# Patient Record
Sex: Female | Born: 1945 | Race: White | Hispanic: No | Smoking: Never smoker
Health system: Southern US, Community
[De-identification: ages and names within clinical notes are randomized; demographics above are authoritative.]

## PROBLEM LIST (undated history)

## (undated) DIAGNOSIS — I2510 Atherosclerotic heart disease of native coronary artery without angina pectoris: Secondary | ICD-10-CM

## (undated) DIAGNOSIS — I739 Peripheral vascular disease, unspecified: Secondary | ICD-10-CM

## (undated) DIAGNOSIS — K219 Gastro-esophageal reflux disease without esophagitis: Secondary | ICD-10-CM

## (undated) DIAGNOSIS — S88112A Complete traumatic amputation at level between knee and ankle, left lower leg, initial encounter: Secondary | ICD-10-CM

## (undated) DIAGNOSIS — I5042 Chronic combined systolic (congestive) and diastolic (congestive) heart failure: Secondary | ICD-10-CM

## (undated) DIAGNOSIS — C9110 Chronic lymphocytic leukemia of B-cell type not having achieved remission: Secondary | ICD-10-CM

## (undated) DIAGNOSIS — Z8674 Personal history of sudden cardiac arrest: Secondary | ICD-10-CM

## (undated) DIAGNOSIS — I255 Ischemic cardiomyopathy: Secondary | ICD-10-CM

## (undated) DIAGNOSIS — E785 Hyperlipidemia, unspecified: Secondary | ICD-10-CM

## (undated) DIAGNOSIS — S88111A Complete traumatic amputation at level between knee and ankle, right lower leg, initial encounter: Secondary | ICD-10-CM

## (undated) DIAGNOSIS — K449 Diaphragmatic hernia without obstruction or gangrene: Secondary | ICD-10-CM

## (undated) DIAGNOSIS — E119 Type 2 diabetes mellitus without complications: Secondary | ICD-10-CM

## (undated) DIAGNOSIS — I4819 Other persistent atrial fibrillation: Secondary | ICD-10-CM

## (undated) DIAGNOSIS — N184 Chronic kidney disease, stage 4 (severe): Secondary | ICD-10-CM

## (undated) DIAGNOSIS — I10 Essential (primary) hypertension: Secondary | ICD-10-CM

## (undated) HISTORY — DX: Chronic combined systolic (congestive) and diastolic (congestive) heart failure: I50.42

## (undated) HISTORY — DX: Peripheral vascular disease, unspecified: I73.9

## (undated) HISTORY — DX: Other persistent atrial fibrillation: I48.19

## (undated) HISTORY — DX: Diaphragmatic hernia without obstruction or gangrene: K44.9

## (undated) HISTORY — PX: OTHER SURGICAL HISTORY: SHX169

## (undated) HISTORY — PX: CARDIAC CATHETERIZATION: SHX172

## (undated) HISTORY — PX: CORONARY ARTERY BYPASS GRAFT: SHX141

## (undated) HISTORY — DX: Chronic kidney disease, stage 4 (severe): N18.4

## (undated) HISTORY — DX: Personal history of sudden cardiac arrest: Z86.74

## (undated) HISTORY — DX: Chronic lymphocytic leukemia of B-cell type not having achieved remission: C91.10

## (undated) HISTORY — DX: Ischemic cardiomyopathy: I25.5

## (undated) HISTORY — PX: ABDOMINAL HYSTERECTOMY: SHX81

## (undated) HISTORY — DX: Atherosclerotic heart disease of native coronary artery without angina pectoris: I25.10

## (undated) HISTORY — DX: Essential (primary) hypertension: I10

---

## 2003-09-12 HISTORY — PX: IMPLANTABLE CARDIOVERTER DEFIBRILLATOR IMPLANT: SHX5860

## 2011-04-26 ENCOUNTER — Emergency Department: Payer: Self-pay | Admitting: Emergency Medicine

## 2012-10-30 ENCOUNTER — Ambulatory Visit: Payer: Medicare Other | Admitting: Physical Therapy

## 2012-11-04 ENCOUNTER — Ambulatory Visit: Payer: Medicare Other | Attending: Orthopaedic Surgery | Admitting: Physical Therapy

## 2012-11-04 DIAGNOSIS — IMO0001 Reserved for inherently not codable concepts without codable children: Secondary | ICD-10-CM | POA: Insufficient documentation

## 2012-11-04 DIAGNOSIS — R5381 Other malaise: Secondary | ICD-10-CM | POA: Insufficient documentation

## 2012-11-04 DIAGNOSIS — S88119A Complete traumatic amputation at level between knee and ankle, unspecified lower leg, initial encounter: Secondary | ICD-10-CM | POA: Insufficient documentation

## 2012-11-04 DIAGNOSIS — R269 Unspecified abnormalities of gait and mobility: Secondary | ICD-10-CM | POA: Insufficient documentation

## 2012-11-04 DIAGNOSIS — M6281 Muscle weakness (generalized): Secondary | ICD-10-CM | POA: Insufficient documentation

## 2012-11-11 ENCOUNTER — Ambulatory Visit: Payer: Medicare Other | Attending: Orthopaedic Surgery | Admitting: Physical Therapy

## 2012-11-11 DIAGNOSIS — M6281 Muscle weakness (generalized): Secondary | ICD-10-CM | POA: Insufficient documentation

## 2012-11-11 DIAGNOSIS — S88119A Complete traumatic amputation at level between knee and ankle, unspecified lower leg, initial encounter: Secondary | ICD-10-CM | POA: Insufficient documentation

## 2012-11-11 DIAGNOSIS — R269 Unspecified abnormalities of gait and mobility: Secondary | ICD-10-CM | POA: Insufficient documentation

## 2012-11-11 DIAGNOSIS — IMO0001 Reserved for inherently not codable concepts without codable children: Secondary | ICD-10-CM | POA: Insufficient documentation

## 2012-11-11 DIAGNOSIS — R5381 Other malaise: Secondary | ICD-10-CM | POA: Insufficient documentation

## 2012-11-12 ENCOUNTER — Ambulatory Visit: Payer: Medicare Other | Admitting: Physical Therapy

## 2012-11-14 ENCOUNTER — Ambulatory Visit: Payer: Medicare Other | Admitting: Physical Therapy

## 2012-11-18 ENCOUNTER — Encounter: Payer: Medicare Other | Admitting: Physical Therapy

## 2012-11-19 ENCOUNTER — Ambulatory Visit: Payer: Medicare Other | Admitting: Physical Therapy

## 2012-11-22 ENCOUNTER — Ambulatory Visit: Payer: Medicare Other | Admitting: Physical Therapy

## 2012-11-25 ENCOUNTER — Ambulatory Visit: Payer: Medicare Other | Admitting: Physical Therapy

## 2012-11-26 ENCOUNTER — Ambulatory Visit: Payer: Medicare Other | Admitting: Physical Therapy

## 2012-11-28 ENCOUNTER — Ambulatory Visit: Payer: Medicare Other | Admitting: Physical Therapy

## 2012-12-02 ENCOUNTER — Ambulatory Visit: Payer: Medicare Other | Admitting: Physical Therapy

## 2012-12-05 ENCOUNTER — Ambulatory Visit: Payer: Medicare Other | Admitting: Physical Therapy

## 2012-12-11 ENCOUNTER — Ambulatory Visit: Payer: Medicare Other | Admitting: Physical Therapy

## 2012-12-12 ENCOUNTER — Ambulatory Visit: Payer: Medicare Other | Attending: Orthopaedic Surgery | Admitting: Physical Therapy

## 2012-12-12 DIAGNOSIS — R269 Unspecified abnormalities of gait and mobility: Secondary | ICD-10-CM | POA: Insufficient documentation

## 2012-12-12 DIAGNOSIS — IMO0001 Reserved for inherently not codable concepts without codable children: Secondary | ICD-10-CM | POA: Insufficient documentation

## 2012-12-12 DIAGNOSIS — M6281 Muscle weakness (generalized): Secondary | ICD-10-CM | POA: Insufficient documentation

## 2012-12-12 DIAGNOSIS — S88119A Complete traumatic amputation at level between knee and ankle, unspecified lower leg, initial encounter: Secondary | ICD-10-CM | POA: Insufficient documentation

## 2012-12-12 DIAGNOSIS — R5381 Other malaise: Secondary | ICD-10-CM | POA: Insufficient documentation

## 2012-12-13 ENCOUNTER — Ambulatory Visit: Payer: Medicare Other | Admitting: Physical Therapy

## 2012-12-17 ENCOUNTER — Ambulatory Visit: Payer: Medicare Other | Admitting: Physical Therapy

## 2012-12-19 ENCOUNTER — Ambulatory Visit: Payer: Medicare Other | Admitting: Physical Therapy

## 2012-12-23 ENCOUNTER — Ambulatory Visit: Payer: Medicare Other | Admitting: Physical Therapy

## 2012-12-25 ENCOUNTER — Ambulatory Visit: Payer: Medicare Other | Admitting: Physical Therapy

## 2012-12-30 ENCOUNTER — Ambulatory Visit: Payer: Medicare Other | Admitting: Physical Therapy

## 2013-01-01 ENCOUNTER — Ambulatory Visit: Payer: Medicare Other | Admitting: Physical Therapy

## 2013-01-06 ENCOUNTER — Ambulatory Visit: Payer: Medicare Other | Admitting: Physical Therapy

## 2013-01-08 ENCOUNTER — Ambulatory Visit: Payer: Medicare Other | Admitting: Physical Therapy

## 2013-01-13 ENCOUNTER — Ambulatory Visit: Payer: Medicare Other | Attending: Orthopaedic Surgery | Admitting: Physical Therapy

## 2013-01-13 DIAGNOSIS — IMO0001 Reserved for inherently not codable concepts without codable children: Secondary | ICD-10-CM | POA: Insufficient documentation

## 2013-01-13 DIAGNOSIS — M6281 Muscle weakness (generalized): Secondary | ICD-10-CM | POA: Insufficient documentation

## 2013-01-13 DIAGNOSIS — R269 Unspecified abnormalities of gait and mobility: Secondary | ICD-10-CM | POA: Insufficient documentation

## 2013-01-13 DIAGNOSIS — S88119A Complete traumatic amputation at level between knee and ankle, unspecified lower leg, initial encounter: Secondary | ICD-10-CM | POA: Insufficient documentation

## 2013-01-13 DIAGNOSIS — R5381 Other malaise: Secondary | ICD-10-CM | POA: Insufficient documentation

## 2013-01-15 ENCOUNTER — Ambulatory Visit: Payer: Medicare Other | Admitting: Physical Therapy

## 2013-01-20 ENCOUNTER — Ambulatory Visit: Payer: Medicare Other | Admitting: Physical Therapy

## 2013-01-22 ENCOUNTER — Ambulatory Visit: Payer: Medicare Other | Admitting: Physical Therapy

## 2015-06-12 ENCOUNTER — Encounter: Payer: Self-pay | Admitting: Emergency Medicine

## 2015-06-12 ENCOUNTER — Emergency Department: Payer: Medicare HMO

## 2015-06-12 ENCOUNTER — Observation Stay
Admission: EM | Admit: 2015-06-12 | Discharge: 2015-06-14 | Disposition: A | Discharge status: Home / Self Care | Payer: Medicare HMO | Attending: Internal Medicine | Admitting: Internal Medicine

## 2015-06-12 DIAGNOSIS — R001 Bradycardia, unspecified: Secondary | ICD-10-CM | POA: Insufficient documentation

## 2015-06-12 DIAGNOSIS — Z23 Encounter for immunization: Secondary | ICD-10-CM | POA: Diagnosis not present

## 2015-06-12 DIAGNOSIS — Z881 Allergy status to other antibiotic agents status: Secondary | ICD-10-CM | POA: Diagnosis not present

## 2015-06-12 DIAGNOSIS — N189 Chronic kidney disease, unspecified: Secondary | ICD-10-CM | POA: Diagnosis present

## 2015-06-12 DIAGNOSIS — R0602 Shortness of breath: Secondary | ICD-10-CM | POA: Insufficient documentation

## 2015-06-12 DIAGNOSIS — R140 Abdominal distension (gaseous): Secondary | ICD-10-CM | POA: Insufficient documentation

## 2015-06-12 DIAGNOSIS — N183 Chronic kidney disease, stage 3 (moderate): Secondary | ICD-10-CM | POA: Insufficient documentation

## 2015-06-12 DIAGNOSIS — Z89512 Acquired absence of left leg below knee: Secondary | ICD-10-CM | POA: Diagnosis not present

## 2015-06-12 DIAGNOSIS — Z794 Long term (current) use of insulin: Secondary | ICD-10-CM | POA: Diagnosis not present

## 2015-06-12 DIAGNOSIS — C9110 Chronic lymphocytic leukemia of B-cell type not having achieved remission: Secondary | ICD-10-CM | POA: Diagnosis not present

## 2015-06-12 DIAGNOSIS — R17 Unspecified jaundice: Secondary | ICD-10-CM | POA: Insufficient documentation

## 2015-06-12 DIAGNOSIS — R079 Chest pain, unspecified: Secondary | ICD-10-CM | POA: Diagnosis present

## 2015-06-12 DIAGNOSIS — I5023 Acute on chronic systolic (congestive) heart failure: Secondary | ICD-10-CM | POA: Insufficient documentation

## 2015-06-12 DIAGNOSIS — E785 Hyperlipidemia, unspecified: Secondary | ICD-10-CM | POA: Insufficient documentation

## 2015-06-12 DIAGNOSIS — R002 Palpitations: Secondary | ICD-10-CM | POA: Diagnosis not present

## 2015-06-12 DIAGNOSIS — K219 Gastro-esophageal reflux disease without esophagitis: Secondary | ICD-10-CM | POA: Insufficient documentation

## 2015-06-12 DIAGNOSIS — R0609 Other forms of dyspnea: Secondary | ICD-10-CM | POA: Insufficient documentation

## 2015-06-12 DIAGNOSIS — Z801 Family history of malignant neoplasm of trachea, bronchus and lung: Secondary | ICD-10-CM | POA: Insufficient documentation

## 2015-06-12 DIAGNOSIS — Z90710 Acquired absence of both cervix and uterus: Secondary | ICD-10-CM | POA: Insufficient documentation

## 2015-06-12 DIAGNOSIS — I481 Persistent atrial fibrillation: Secondary | ICD-10-CM | POA: Diagnosis not present

## 2015-06-12 DIAGNOSIS — Z950 Presence of cardiac pacemaker: Secondary | ICD-10-CM | POA: Diagnosis not present

## 2015-06-12 DIAGNOSIS — Z8249 Family history of ischemic heart disease and other diseases of the circulatory system: Secondary | ICD-10-CM | POA: Diagnosis not present

## 2015-06-12 DIAGNOSIS — E1122 Type 2 diabetes mellitus with diabetic chronic kidney disease: Principal | ICD-10-CM | POA: Insufficient documentation

## 2015-06-12 DIAGNOSIS — N179 Acute kidney failure, unspecified: Secondary | ICD-10-CM | POA: Insufficient documentation

## 2015-06-12 DIAGNOSIS — R110 Nausea: Secondary | ICD-10-CM | POA: Diagnosis not present

## 2015-06-12 DIAGNOSIS — Z951 Presence of aortocoronary bypass graft: Secondary | ICD-10-CM | POA: Diagnosis not present

## 2015-06-12 DIAGNOSIS — Z89511 Acquired absence of right leg below knee: Secondary | ICD-10-CM | POA: Insufficient documentation

## 2015-06-12 DIAGNOSIS — H3530 Unspecified macular degeneration: Secondary | ICD-10-CM | POA: Diagnosis not present

## 2015-06-12 DIAGNOSIS — Z79899 Other long term (current) drug therapy: Secondary | ICD-10-CM | POA: Diagnosis not present

## 2015-06-12 DIAGNOSIS — I4819 Other persistent atrial fibrillation: Secondary | ICD-10-CM | POA: Insufficient documentation

## 2015-06-12 DIAGNOSIS — R5381 Other malaise: Secondary | ICD-10-CM | POA: Insufficient documentation

## 2015-06-12 DIAGNOSIS — I25119 Atherosclerotic heart disease of native coronary artery with unspecified angina pectoris: Secondary | ICD-10-CM | POA: Insufficient documentation

## 2015-06-12 DIAGNOSIS — I25701 Atherosclerosis of coronary artery bypass graft(s), unspecified, with angina pectoris with documented spasm: Secondary | ICD-10-CM | POA: Insufficient documentation

## 2015-06-12 DIAGNOSIS — I130 Hypertensive heart and chronic kidney disease with heart failure and stage 1 through stage 4 chronic kidney disease, or unspecified chronic kidney disease: Secondary | ICD-10-CM | POA: Diagnosis not present

## 2015-06-12 DIAGNOSIS — I25111 Atherosclerotic heart disease of native coronary artery with angina pectoris with documented spasm: Secondary | ICD-10-CM | POA: Insufficient documentation

## 2015-06-12 DIAGNOSIS — K449 Diaphragmatic hernia without obstruction or gangrene: Secondary | ICD-10-CM | POA: Diagnosis not present

## 2015-06-12 DIAGNOSIS — N185 Chronic kidney disease, stage 5: Secondary | ICD-10-CM | POA: Insufficient documentation

## 2015-06-12 DIAGNOSIS — I5043 Acute on chronic combined systolic (congestive) and diastolic (congestive) heart failure: Secondary | ICD-10-CM | POA: Insufficient documentation

## 2015-06-12 DIAGNOSIS — Z7982 Long term (current) use of aspirin: Secondary | ICD-10-CM | POA: Insufficient documentation

## 2015-06-12 DIAGNOSIS — I209 Angina pectoris, unspecified: Secondary | ICD-10-CM

## 2015-06-12 DIAGNOSIS — R05 Cough: Secondary | ICD-10-CM | POA: Insufficient documentation

## 2015-06-12 DIAGNOSIS — Z95810 Presence of automatic (implantable) cardiac defibrillator: Secondary | ICD-10-CM | POA: Diagnosis not present

## 2015-06-12 HISTORY — DX: Gastro-esophageal reflux disease without esophagitis: K21.9

## 2015-06-12 HISTORY — DX: Complete traumatic amputation at level between knee and ankle, left lower leg, initial encounter: S88.112A

## 2015-06-12 HISTORY — DX: Type 2 diabetes mellitus without complications: E11.9

## 2015-06-12 HISTORY — DX: Complete traumatic amputation at level between knee and ankle, right lower leg, initial encounter: S88.111A

## 2015-06-12 HISTORY — DX: Hyperlipidemia, unspecified: E78.5

## 2015-06-12 LAB — GLUCOSE, CAPILLARY: GLUCOSE-CAPILLARY: 142 mg/dL — AB (ref 65–99)

## 2015-06-12 LAB — CBC WITH DIFFERENTIAL/PLATELET
BASOS PCT: 0 %
Basophils Absolute: 0 10*3/uL (ref 0–0.1)
EOS PCT: 1 %
Eosinophils Absolute: 0.2 10*3/uL (ref 0–0.7)
HEMATOCRIT: 35.6 % (ref 35.0–47.0)
Hemoglobin: 11.6 g/dL — ABNORMAL LOW (ref 12.0–16.0)
Lymphocytes Relative: 70 %
Lymphs Abs: 16.9 10*3/uL — ABNORMAL HIGH (ref 1.0–3.6)
MCH: 26.9 pg (ref 26.0–34.0)
MCHC: 32.6 g/dL (ref 32.0–36.0)
MCV: 82.7 fL (ref 80.0–100.0)
MONO ABS: 0.7 10*3/uL (ref 0.2–0.9)
MONOS PCT: 3 %
NEUTROS PCT: 26 %
Neutro Abs: 6.3 10*3/uL (ref 1.4–6.5)
Platelets: 148 10*3/uL — ABNORMAL LOW (ref 150–440)
RBC: 4.3 MIL/uL (ref 3.80–5.20)
RDW: 17.7 % — ABNORMAL HIGH (ref 11.5–14.5)
WBC: 24.1 10*3/uL — AB (ref 3.6–11.0)

## 2015-06-12 LAB — BASIC METABOLIC PANEL
ANION GAP: 7 (ref 5–15)
BUN: 46 mg/dL — AB (ref 6–20)
CHLORIDE: 101 mmol/L (ref 101–111)
CO2: 24 mmol/L (ref 22–32)
Calcium: 8.4 mg/dL — ABNORMAL LOW (ref 8.9–10.3)
Creatinine, Ser: 2.48 mg/dL — ABNORMAL HIGH (ref 0.44–1.00)
GFR calc Af Amer: 22 mL/min — ABNORMAL LOW (ref >60)
GFR calc non Af Amer: 19 mL/min — ABNORMAL LOW (ref >60)
GLUCOSE: 176 mg/dL — AB (ref 65–99)
Potassium: 4.1 mmol/L (ref 3.5–5.1)
Sodium: 132 mmol/L — ABNORMAL LOW (ref 135–145)

## 2015-06-12 LAB — BRAIN NATRIURETIC PEPTIDE: B NATRIURETIC PEPTIDE 5: 456 pg/mL — AB (ref 0.0–100.0)

## 2015-06-12 LAB — TROPONIN I: Troponin I: <0.03 ng/mL (ref <0.031)

## 2015-06-12 MED ORDER — NITROGLYCERIN 0.4 MG SL SUBL
0.4000 mg | SUBLINGUAL_TABLET | SUBLINGUAL | Status: DC | PRN
  Administered 2015-06-12 – 2015-06-13 (×3): 0.4 mg via SUBLINGUAL
  Filled 2015-06-12 (×2): qty 1

## 2015-06-12 MED ORDER — OXYCODONE-ACETAMINOPHEN 5-325 MG PO TABS
1.0000 | ORAL_TABLET | ORAL | Status: DC | PRN
  Administered 2015-06-12 – 2015-06-13 (×4): 1 via ORAL
  Filled 2015-06-12 (×4): qty 1

## 2015-06-12 MED ORDER — SODIUM FLUORIDE 1.1 % DT GEL: Freq: Every day | DENTAL | Status: DC

## 2015-06-12 MED ORDER — ACETAMINOPHEN 325 MG PO TABS: 650.0000 mg | ORAL_TABLET | Freq: Four times a day (QID) | ORAL | Status: DC | PRN

## 2015-06-12 MED ORDER — CARVEDILOL 25 MG PO TABS
25.0000 mg | ORAL_TABLET | Freq: Two times a day (BID) | ORAL | Status: DC
Start: 2015-06-12 — End: 2015-06-13
  Administered 2015-06-12 – 2015-06-13 (×2): 25 mg via ORAL
  Filled 2015-06-12 (×2): qty 1

## 2015-06-12 MED ORDER — INSULIN GLARGINE 100 UNIT/ML ~~LOC~~ SOLN
14.0000 [IU] | Freq: Every day | SUBCUTANEOUS | Status: DC
  Administered 2015-06-12 – 2015-06-13 (×2): 14 [IU] via SUBCUTANEOUS
  Filled 2015-06-12 (×3): qty 0.14

## 2015-06-12 MED ORDER — ASPIRIN 81 MG PO CHEW
81.0000 mg | CHEWABLE_TABLET | Freq: Every day | ORAL | Status: DC
  Administered 2015-06-12 – 2015-06-13 (×2): 81 mg via ORAL
  Filled 2015-06-12 (×2): qty 1

## 2015-06-12 MED ORDER — ACETAMINOPHEN 650 MG RE SUPP: 650.0000 mg | Freq: Four times a day (QID) | RECTAL | Status: DC | PRN

## 2015-06-12 MED ORDER — NITROGLYCERIN 0.4 MG SL SUBL
SUBLINGUAL_TABLET | SUBLINGUAL | Status: AC
  Administered 2015-06-12: 0.4 mg via SUBLINGUAL
  Filled 2015-06-12: qty 1

## 2015-06-12 MED ORDER — INSULIN GLARGINE 100 UNIT/ML ~~LOC~~ SOLN
35.0000 [IU] | Freq: Every day | SUBCUTANEOUS | Status: DC
  Administered 2015-06-13 – 2015-06-14 (×2): 35 [IU] via SUBCUTANEOUS
  Filled 2015-06-12 (×4): qty 0.35

## 2015-06-12 MED ORDER — OCUVITE PO TABS
1.0000 | ORAL_TABLET | Freq: Every day | ORAL | Status: DC
  Administered 2015-06-12 – 2015-06-14 (×3): 1 via ORAL
  Filled 2015-06-12 (×4): qty 1

## 2015-06-12 MED ORDER — SODIUM CHLORIDE 0.9 % IJ SOLN
3.0000 mL | Freq: Two times a day (BID) | INTRAMUSCULAR | Status: DC
  Administered 2015-06-12 – 2015-06-14 (×4): 3 mL via INTRAVENOUS

## 2015-06-12 MED ORDER — MORPHINE SULFATE (PF) 4 MG/ML IV SOLN
4.0000 mg | Freq: Once | INTRAVENOUS | Status: AC
  Administered 2015-06-12: 4 mg via INTRAVENOUS
  Filled 2015-06-12: qty 1

## 2015-06-12 MED ORDER — POLYSACCHARIDE IRON COMPLEX 150 MG PO CAPS
150.0000 mg | ORAL_CAPSULE | Freq: Every day | ORAL | Status: DC
  Administered 2015-06-12 – 2015-06-14 (×3): 150 mg via ORAL
  Filled 2015-06-12 (×3): qty 1

## 2015-06-12 MED ORDER — FUROSEMIDE 10 MG/ML IJ SOLN
60.0000 mg | Freq: Every day | INTRAMUSCULAR | Status: DC
  Administered 2015-06-13 – 2015-06-14 (×2): 60 mg via INTRAVENOUS
  Filled 2015-06-12 (×2): qty 6

## 2015-06-12 MED ORDER — ATORVASTATIN CALCIUM 20 MG PO TABS
40.0000 mg | ORAL_TABLET | Freq: Every day | ORAL | Status: DC
  Administered 2015-06-12 – 2015-06-14 (×3): 40 mg via ORAL
  Filled 2015-06-12 (×3): qty 2

## 2015-06-12 MED ORDER — INSULIN ASPART 100 UNIT/ML ~~LOC~~ SOLN
0.0000 [IU] | Freq: Every day | SUBCUTANEOUS | Status: DC
  Administered 2015-06-13: 2 [IU] via SUBCUTANEOUS
  Filled 2015-06-12: qty 2

## 2015-06-12 MED ORDER — HEPARIN SODIUM (PORCINE) 5000 UNIT/ML IJ SOLN
5000.0000 [IU] | Freq: Three times a day (TID) | INTRAMUSCULAR | Status: DC
  Administered 2015-06-12 – 2015-06-14 (×6): 5000 [IU] via SUBCUTANEOUS
  Filled 2015-06-12 (×6): qty 1

## 2015-06-12 MED ORDER — AMITRIPTYLINE HCL 25 MG PO TABS
25.0000 mg | ORAL_TABLET | Freq: Every evening | ORAL | Status: DC
  Administered 2015-06-12 – 2015-06-13 (×2): 25 mg via ORAL
  Filled 2015-06-12 (×2): qty 1

## 2015-06-12 MED ORDER — INSULIN ASPART 100 UNIT/ML ~~LOC~~ SOLN
0.0000 [IU] | Freq: Three times a day (TID) | SUBCUTANEOUS | Status: DC
  Administered 2015-06-13: 2 [IU] via SUBCUTANEOUS
  Administered 2015-06-13 (×2): 3 [IU] via SUBCUTANEOUS
  Filled 2015-06-12: qty 2
  Filled 2015-06-12 (×2): qty 3

## 2015-06-12 MED ORDER — SERTRALINE HCL 50 MG PO TABS
100.0000 mg | ORAL_TABLET | Freq: Every day | ORAL | Status: DC
  Administered 2015-06-12 – 2015-06-14 (×3): 100 mg via ORAL
  Filled 2015-06-12 (×3): qty 2

## 2015-06-12 MED ORDER — VITAMIN C 500 MG PO TABS
500.0000 mg | ORAL_TABLET | Freq: Every day | ORAL | Status: DC
  Administered 2015-06-12 – 2015-06-14 (×3): 500 mg via ORAL
  Filled 2015-06-12 (×3): qty 1

## 2015-06-12 MED ORDER — AMLODIPINE BESYLATE 10 MG PO TABS
10.0000 mg | ORAL_TABLET | Freq: Every day | ORAL | Status: DC
  Administered 2015-06-12 – 2015-06-13 (×2): 10 mg via ORAL
  Filled 2015-06-12 (×2): qty 1

## 2015-06-12 MED ORDER — VITAMIN D 1000 UNITS PO TABS
4000.0000 [IU] | ORAL_TABLET | Freq: Every day | ORAL | Status: DC
  Administered 2015-06-12 – 2015-06-14 (×3): 4000 [IU] via ORAL
  Filled 2015-06-12 (×3): qty 4

## 2015-06-12 MED ORDER — FUROSEMIDE 10 MG/ML IJ SOLN
80.0000 mg | Freq: Once | INTRAMUSCULAR | Status: AC
  Administered 2015-06-12: 80 mg via INTRAVENOUS
  Filled 2015-06-12: qty 8

## 2015-06-12 MED ORDER — PANTOPRAZOLE SODIUM 40 MG PO TBEC
40.0000 mg | DELAYED_RELEASE_TABLET | Freq: Every day | ORAL | Status: DC
  Administered 2015-06-13 – 2015-06-14 (×2): 40 mg via ORAL
  Filled 2015-06-12 (×2): qty 1

## 2015-06-12 NOTE — ED Notes (Addendum)
Pt arrived via EMS from home with daughter.  Pt states chest pain started about an hour ago. Patient reports not feeling well over the past few days. Has a history of cardiac arrest 12 years ago.  Recently increased dosage of Lasix due to feeling like she had extra fluid on her.  Patient reports to having central chest pain radiating to left side of the neck. 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  Reports diaphoresis as well. EMS gave 324 ASA and 1 of nitroglycerin

## 2015-06-12 NOTE — Progress Notes (Signed)
Pt. Arrived to floor via stretcher, she transfer from stretcher to bed with no help. Tele applied. Pt. Alert and oritented. Pt. Oriented to room, call bell system and ascoms, no complaint at this time.   SKIN assessment; Skin is warm and dry. Pt. Is bilateral below knee amputee. No skin issues noted. Verifying nurse CK,RN

## 2015-06-12 NOTE — H&P (Signed)
[facility] [place] NAME: Caroline Hernandez    MR#:  [id number]  DATE OF BIRTH:  May 25, 1946  DATE OF ADMISSION:  06/12/2015  PRIMARY CARE PHYSICIAN: Dr. Durward Parcel  REQUESTING/REFERRING PHYSICIAN: Dr. Dineen Kid  CHIEF COMPLAINT:   Chief Complaint  Patient presents with  . Chest Pain    HISTORY OF PRESENT ILLNESS:  Caroline Hernandez  is a 69 y.o. female with a known history of sudden death, CAD, CHF, atrial fibrillation, hypertension, diabetes, CLL, chronic kidney disease. She presents with not feeling right. She got hot and sweaty. Developed chest pressure over the last 2-3 days like a brick sitting on her chest. It comes and goes mostly at rest. 2 nitroglycerin helped and morphine also helped. She's not been very active since he developed a sore on her leg in August she usually uses a wheelchair. 2 days ago she did have some vomiting episode. Currently she feels okay just a little aching in the chest. Hospitalist services were contacted for further evaluation  PAST MEDICAL HISTORY:   Past Medical History  Diagnosis Date  . CHF (congestive heart failure) ([place])   . Diabetes mellitus without complication ([place])   . Hypertension   . Cancer ([place])     leukemia  . Amputation of right lower extremity below knee ([place])   . Amputation of left lower extremity below knee ([place])   . A-fib ([place])   . GERD (gastroesophageal reflux disease)   . Hyperlipidemia     PAST SURGICAL HISTORY:   Past Surgical History  Procedure Laterality Date  . Abdominal hysterectomy    . Insert / replace / remove pacemaker    . Amputation lower extremity bilaterally Bilateral   . Coronary artery bypass graft      SOCIAL HISTORY:   Social History  Substance Use Topics  . Smoking status: Never Smoker   . Smokeless tobacco: Not on file  . Alcohol Use: No    FAMILY HISTORY:   Family History  Problem Relation Age of Onset  . CAD Mother   . Diabetes Mother    . Atrial fibrillation Mother   . Lung cancer Father   . Diabetes Father     DRUG ALLERGIES:   Allergies  Allergen Reactions  . Zosyn [Piperacillin Sod-Tazobactam So] Other (See Comments)    Renal failure    REVIEW OF SYSTEMS:  CONSTITUTIONAL: No fever, positive for chills. Positive weakness  EYES: No blurred or double vision. Wears glasses. Has macular degeneration. EARS, NOSE, AND THROAT: No tinnitus or ear pain. No sore throat. Positive for runny nose and hoarse voice. RESPIRATORY: Positive for cough, positive for shortness of breath, no wheezing or hemoptysis.  CARDIOVASCULAR: Positive for chest pain, orthopnea, and edema.  GASTROINTESTINAL: Positive for nausea and vomiting a few days ago, no diarrhea or abdominal pain. No blood in bowel movements GENITOURINARY: No dysuria, hematuria.  ENDOCRINE: No polyuria, nocturia,  HEMATOLOGY: Positive for anemia, no easy bruising or bleeding SKIN: No rash or lesion. MUSCULOSKELETAL: No joint pain or arthritis.   NEUROLOGIC: Felt faint PSYCHIATRY: Positive for anxiety or depression.   MEDICATIONS AT HOME:   Prior to Admission medications   Medication Sig Start Date End Date Taking? Authorizing Provider  amitriptyline (ELAVIL) 25 MG tablet Take 25 mg by mouth every evening. 06/02/15  Yes Historical Provider, MD  amLODipine (NORVASC) 10 MG tablet Take 10 mg by mouth daily. 06/02/15  Yes Historical Provider, MD  amoxicillin (AMOXIL) 500 MG capsule  Take 500 mg by mouth 3 (three) times daily. X 4 days 06/09/15  Yes Historical Provider, MD  aspirin 81 MG chewable tablet Chew 81 mg by mouth at bedtime.   Yes Historical Provider, MD  atorvastatin (LIPITOR) 40 MG tablet Take 40 mg by mouth daily. 06/02/15  Yes Historical Provider, MD  carvedilol (COREG) 25 MG tablet Take 25 mg by mouth 2 (two) times daily. 05/30/15  Yes Historical Provider, MD  Cholecalciferol (HM VITAMIN D3) 4000 UNITS CAPS Take 4,000 Units by mouth daily.   Yes Historical  Provider, MD  furosemide (LASIX) 40 MG tablet Take 40 mg by mouth daily. 06/02/15  Yes Historical Provider, MD  HUMALOG 100 UNIT/ML injection Inject 10-14 Units into the skin See admin instructions. Inject 12 units subcutaneous in the morning with breakfast, 10 units subcutaneous at lunch, and 14 units subcutaneous at suppertime. 05/28/15  Yes Historical Provider, MD  LANTUS 100 UNIT/ML injection Inject 14-35 Units into the skin See admin instructions. Inject 35 units subcutaneous once a day in the morning. Inject 14 units subcutaneous once a day at bedtime. 05/31/15  Yes Historical Provider, MD  losartan (COZAAR) 100 MG tablet Take 100 mg by mouth daily. 06/02/15  Yes Historical Provider, MD  Multiple Vitamins-Minerals (OCUTABS-LUTEIN) TABS Take 1 tablet by mouth daily.   Yes Historical Provider, MD  oxyCODONE-acetaminophen (PERCOCET/ROXICET) 5-325 MG tablet Take 1 tablet by mouth every 4 (four) hours as needed. For pain. 06/09/15  Yes Historical Provider, MD  pantoprazole (PROTONIX) 40 MG tablet Take 40 mg by mouth daily. 05/24/15  Yes Historical Provider, MD  POLY-IRON 150 150 MG capsule Take 150 mg by mouth daily. 05/24/15  Yes Historical Provider, MD  PREVIDENT 5000 DRY MOUTH 1.1 % GEL dental gel Take by mouth daily. Brush teeth with paste for at least 1 minute once a day. 03/25/15  Yes Historical Provider, MD  sertraline (ZOLOFT) 100 MG tablet Take 100 mg by mouth daily. 06/02/15  Yes Historical Provider, MD  vitamin C (ASCORBIC ACID) 500 MG tablet Take 500 mg by mouth daily.   Yes Historical Provider, MD      VITAL SIGNS:  Blood pressure 128/54, pulse 41, temperature 98.2 F (36.8 C), temperature source Oral, resp. rate 17, height 5\' 8"  (1.727 m), weight 87.544 kg (193 lb), SpO2 91 %.  PHYSICAL EXAMINATION:  GENERAL:  69 y.o.-year-old patient lying in the bed with no acute distress.  EYES: Pupils equal, round, reactive to light and accommodation. No scleral icterus. Extraocular muscles intact.   HEENT: Head atraumatic, normocephalic. Oropharynx and nasopharynx clear.  NECK:  Supple, no jugular venous distention. No thyroid enlargement, no tenderness.  LUNGS: Decreased breath sounds bilaterally, positive rales at the bases bilaterally, no rhonchi or crepitation. No use of accessory muscles of respiration.  CARDIOVASCULAR: S1, S2 irregularly irregular. No murmurs, rubs, or gallops.  ABDOMEN: Soft, nontender, nondistended. Bowel sounds present. No organomegaly or mass.  EXTREMITIES: 2+ edema, no cyanosis, or clubbing.  NEUROLOGIC: Cranial nerves II through XII are intact. Muscle strength 5/5 in all extremities. Sensation intact. Gait not checked.  PSYCHIATRIC: The patient is alert and oriented x 3.  SKIN: No rash, lesion, or ulcer.   LABORATORY PANEL:   CBC  Recent Labs Lab 06/12/15 1706  WBC 24.1*  HGB 11.6*  HCT 35.6  PLT 148*   ------------------------------------------------------------------------------------------------------------------  Chemistries   Recent Labs Lab 06/12/15 1706  NA 132*  K 4.1  CL 101  CO2 24  GLUCOSE 176*  BUN 46*  CREATININE 2.48*  CALCIUM 8.4*   ------------------------------------------------------------------------------------------------------------------  Cardiac Enzymes  Recent Labs Lab 06/12/15 1706  TROPONINI <0.03   ------------------------------------------------------------------------------------------------------------------  RADIOLOGY:  Dg Chest 1 View  06/12/2015   CLINICAL DATA:  Chest pain  EXAM: CHEST 1 VIEW  COMPARISON:  04/26/2011  FINDINGS: Cardiac enlargement. Median sternotomy. AICD in satisfactory position and unchanged  Mild vascular congestion consistent with fluid overload. No edema or effusion.  IMPRESSION: Mild fluid overload without edema.   Electronically Signed   By: Franchot Gallo M.D.   On: 06/12/2015 17:42    EKG:   Atrial fibrillation 57 bpm, left axis deviation, Q waves septally and  flipped T's laterally  IMPRESSION AND PLAN:   1. Chest pain with history of CAD. Unfortunately on Saturday evening and Sunday I do not have further cardiac testing. I will admit as an observation and get a cardiology consultation. I will monitor on telemetry and get serial cardiac enzymes. Continue aspirin and Coreg and monitor. 2. Acute on chronic systolic congestive heart failure. I will give a dose of IV Lasix 80 mg 1 and 60 mg tomorrow. Continue Coreg. 3. Acute on chronic kidney disease stage III- monitor closely with diuresis. Hold losartan for right now. 4. Atrial fibrillation rate controlled on Coreg. As per prior cardiology note they are considering anticoagulation. 5. CLL 6. Diabetes type 2- continue Lantus and sliding scale 7. Essential hypertension- continue current medications 8. Gastroesophageal reflux disease without esophagitis on PPI as outpatient will continue while here. 9. History of macular degeneration  All the records are reviewed and case discussed with ED provider. Management plans discussed with the patient, family and they are in agreement.  CODE STATUS: Full code  TOTAL TIME TAKING CARE OF THIS PATIENT: 55 minutes.    Loletha Grayer M.D on 06/12/2015 at 6:50 PM  Between 7am to 6pm - Pager - [phone]  After 6pm call admission pager [facility]  Office  [phone]  CC: Primary care physician; Dr. Abbott Pao

## 2015-06-12 NOTE — ED Provider Notes (Addendum)
[facility] Emergency Department Provider Note  ____________________________________________  Time seen: Seen upon arrival to the emergency department  I have reviewed the triage vital signs and the nursing notes.   HISTORY  Chief Complaint Chest Pain    HPI Caroline Hernandez is a 69 y.o. female with a history of congestive heart failure and coronary artery disease who is presenting today with weakness over the past 4 days and chest pain over the last hour. She says that she has had weakness with exertional dyspnea over the last 4 days and then just an hour ago began to have central chest pain radiating up for jaundice and her left arm. She rates it as a 7 out of 10 that was brought down to a 5 out of 10 with nitroglycerin from the medics. She was also given 324 mg of aspirin. She says that she does have a recent diagnosis of A. fib but has not been placed on any anticoagulation [place] at this time. She has a history of a CABG. Says that her chest pain is back up to a 7 out of 10 at this time. Normally takes 40 mg of Lasix daily but due to which she had perceived as increased swelling to her abdomen she increased her dose to 80 mg yesterday.   Past Medical History  Diagnosis Date  . CHF (congestive heart failure) ([place])   . Diabetes mellitus without complication ([place])   . Hypertension   . Cancer ([place])     leukemia  . Amputation of right lower extremity below knee ([place])   . Amputation of left lower extremity below knee ([place])   . A-fib ([place])   . GERD (gastroesophageal reflux disease)   . Hyperlipidemia     There are no active problems to display for this patient.   Past Surgical History  Procedure Laterality Date  . Abdominal hysterectomy      No current outpatient prescriptions on file.  Allergies Zosyn  History reviewed. No pertinent family history.  Social History Social History  Substance Use Topics  . Smoking status: Never Smoker   . Smokeless  tobacco: None  . Alcohol Use: No    Review of Systems Constitutional: No fever/chills Eyes: No visual changes. ENT: No sore throat. Cardiovascular: As above Respiratory: As above Gastrointestinal: No abdominal pain.  No nausea, no vomiting.  No diarrhea.  No constipation. Genitourinary: Negative for dysuria. Musculoskeletal: Negative for back pain. Skin: Negative for rash. Neurological: Negative for headaches, focal weakness or numbness.  10-point ROS otherwise negative.  ____________________________________________   PHYSICAL EXAM:  VITAL SIGNS: ED Triage Vitals  Enc Vitals Group     BP 06/12/15 1653 141/59 mmHg     Pulse Rate 06/12/15 1701 51     Resp 06/12/15 1701 16     Temp 06/12/15 1701 98.2 F (36.8 C)     Temp Source 06/12/15 1701 Oral     SpO2 06/12/15 1701 92 %     Weight 06/12/15 1701 193 lb (87.544 kg)     Height 06/12/15 1701 5\' 8"  (1.727 m)     Head Cir --      Peak Flow --      Pain Score 06/12/15 1702 8     Pain Loc --      Pain Edu? --      Excl. in [place]? --     Constitutional: Alert and oriented. Well appearing and in no acute distress. Eyes: Conjunctivae are normal. PERRL. EOMI. Head:  Atraumatic. Nose: No congestion/rhinnorhea. Mouth/Throat: Mucous membranes are moist.  Oropharynx non-erythematous. Neck: No stridor.   Cardiovascular: Bradycardic with an irregularly irregular rhythm. Grossly normal heart sounds.  Good peripheral circulation. Respiratory: Normal respiratory effort.  Mild rales to the bilateral bases.  Gastrointestinal: Soft and nontender. No distention. No abdominal bruits. No CVA tenderness. Musculoskeletal: Bilateral leg amputations. Neurologic:  Normal speech and language. No gross focal neurologic deficits are appreciated. No gait instability. Skin:  Skin is warm, dry and intact. No rash noted. Psychiatric: Mood and affect are normal. Speech and behavior are normal.  ____________________________________________    LABS (all labs ordered are listed, but only abnormal results are displayed)  Labs Reviewed  BRAIN NATRIURETIC PEPTIDE - Abnormal; Notable for the following:    B Natriuretic Peptide 456.0 (*)    All other components within normal limits  CBC WITH DIFFERENTIAL/PLATELET - Abnormal; Notable for the following:    WBC 24.1 (*)    Hemoglobin 11.6 (*)    RDW 17.7 (*)    Platelets 148 (*)    All other components within normal limits  BASIC METABOLIC PANEL - Abnormal; Notable for the following:    Sodium 132 (*)    Glucose, Bld 176 (*)    BUN 46 (*)    Creatinine, Ser 2.48 (*)    Calcium 8.4 (*)    GFR calc non Af Amer 19 (*)    GFR calc Af Amer 22 (*)    All other components within normal limits  TROPONIN I  URINALYSIS COMPLETEWITH MICROSCOPIC ([facility] ONLY)   ____________________________________________  EKG  ED ECG REPORT I, Doran Stabler, the attending physician, personally viewed and interpreted this ECG.   Date: 06/12/2015  EKG Time: 1700  Rate: 57  Rhythm: atrial fibrillation, rate 57  Axis: Left axis deviation  Intervals:none  ST&T Change: T wave inversions in 1, aVL and V6. No ST elevations or depressions. No significant change from the EKG done on 04/26/2011. ____________________________________________  RADIOLOGY  Mild fluid overload without edema. I personally reviewed these films. ____________________________________________   PROCEDURES   ____________________________________________   INITIAL IMPRESSION / ASSESSMENT AND PLAN / ED COURSE  Pertinent labs & imaging results that were available during my care of the patient were reviewed by me and considered in my medical decision making (see chart for details).   Baseline what blood cell count of this patient is in the high teens to 20s. Similar to value today.  ----------------------------------------- 5:58 PM on 06/12/2015 -----------------------------------------  Patient resting comfortably  at this time with pain at a 5 out of 10. I reviewed with her her labs and imaging studies. I also discussed with her and her family the plan for admission to the hospital for her chest pain. We also discussed her acute on chronic renal failure with an increase to 2. for rate from the 1.7 so earlier this summer. ____________________________________________   FINAL CLINICAL IMPRESSION(S) / ED DIAGNOSES  Acute chest pain. Acute on chronic renal failure.    Orbie Pyo, MD 06/12/15 1759  Signed out to Dr. Earleen Newport of the hospitalist service.  Orbie Pyo, MD 06/12/15 1800

## 2015-06-12 NOTE — ED Notes (Signed)
MD at bedside. 

## 2015-06-13 DIAGNOSIS — N185 Chronic kidney disease, stage 5: Secondary | ICD-10-CM | POA: Insufficient documentation

## 2015-06-13 DIAGNOSIS — R001 Bradycardia, unspecified: Secondary | ICD-10-CM | POA: Diagnosis not present

## 2015-06-13 DIAGNOSIS — N189 Chronic kidney disease, unspecified: Secondary | ICD-10-CM

## 2015-06-13 DIAGNOSIS — I25111 Atherosclerotic heart disease of native coronary artery with angina pectoris with documented spasm: Secondary | ICD-10-CM | POA: Insufficient documentation

## 2015-06-13 DIAGNOSIS — I4819 Other persistent atrial fibrillation: Secondary | ICD-10-CM | POA: Insufficient documentation

## 2015-06-13 DIAGNOSIS — N179 Acute kidney failure, unspecified: Secondary | ICD-10-CM | POA: Diagnosis not present

## 2015-06-13 DIAGNOSIS — I481 Persistent atrial fibrillation: Secondary | ICD-10-CM

## 2015-06-13 DIAGNOSIS — I209 Angina pectoris, unspecified: Secondary | ICD-10-CM | POA: Insufficient documentation

## 2015-06-13 DIAGNOSIS — I5043 Acute on chronic combined systolic (congestive) and diastolic (congestive) heart failure: Secondary | ICD-10-CM | POA: Diagnosis not present

## 2015-06-13 DIAGNOSIS — I25701 Atherosclerosis of coronary artery bypass graft(s), unspecified, with angina pectoris with documented spasm: Secondary | ICD-10-CM | POA: Insufficient documentation

## 2015-06-13 DIAGNOSIS — I25791 Atherosclerosis of other coronary artery bypass graft(s) with angina pectoris with documented spasm: Secondary | ICD-10-CM

## 2015-06-13 LAB — CBC
HEMATOCRIT: 34.3 % — AB (ref 35.0–47.0)
Hemoglobin: 10.9 g/dL — ABNORMAL LOW (ref 12.0–16.0)
MCH: 26.7 pg (ref 26.0–34.0)
MCHC: 31.9 g/dL — ABNORMAL LOW (ref 32.0–36.0)
MCV: 83.7 fL (ref 80.0–100.0)
PLATELETS: 151 10*3/uL (ref 150–440)
RBC: 4.1 MIL/uL (ref 3.80–5.20)
RDW: 17.9 % — ABNORMAL HIGH (ref 11.5–14.5)
WBC: 26 10*3/uL — ABNORMAL HIGH (ref 3.6–11.0)

## 2015-06-13 LAB — BASIC METABOLIC PANEL
Anion gap: 9 (ref 5–15)
BUN: 47 mg/dL — ABNORMAL HIGH (ref 6–20)
CHLORIDE: 102 mmol/L (ref 101–111)
CO2: 23 mmol/L (ref 22–32)
CREATININE: 2.38 mg/dL — AB (ref 0.44–1.00)
Calcium: 8.3 mg/dL — ABNORMAL LOW (ref 8.9–10.3)
GFR calc non Af Amer: 20 mL/min — ABNORMAL LOW (ref >60)
GFR, EST AFRICAN AMERICAN: 23 mL/min — AB (ref >60)
Glucose, Bld: 243 mg/dL — ABNORMAL HIGH (ref 65–99)
POTASSIUM: 4.1 mmol/L (ref 3.5–5.1)
Sodium: 134 mmol/L — ABNORMAL LOW (ref 135–145)

## 2015-06-13 LAB — LIPID PANEL
CHOLESTEROL: 91 mg/dL (ref 0–200)
HDL: 25 mg/dL — ABNORMAL LOW (ref >40)
LDL Cholesterol: 40 mg/dL (ref 0–99)
TRIGLYCERIDES: 128 mg/dL (ref <150)
Total CHOL/HDL Ratio: 3.6 RATIO
VLDL: 26 mg/dL (ref 0–40)

## 2015-06-13 LAB — GLUCOSE, CAPILLARY
GLUCOSE-CAPILLARY: 217 mg/dL — AB (ref 65–99)
Glucose-Capillary: 199 mg/dL — ABNORMAL HIGH (ref 65–99)
Glucose-Capillary: 216 mg/dL — ABNORMAL HIGH (ref 65–99)
Glucose-Capillary: 208 mg/dL — ABNORMAL HIGH (ref 65–99)

## 2015-06-13 LAB — TROPONIN I

## 2015-06-13 MED ORDER — CARVEDILOL 3.125 MG PO TABS: 3.1250 mg | ORAL_TABLET | Freq: Two times a day (BID) | ORAL | Status: DC

## 2015-06-13 MED ORDER — CARVEDILOL 25 MG PO TABS: 25.0000 mg | ORAL_TABLET | Freq: Two times a day (BID) | ORAL | Status: DC

## 2015-06-13 NOTE — Consult Note (Signed)
Cardiology Consultation Note  Patient ID: Caroline Hernandez, MRN: [id number], DOB/AGE: [birth year] 69 y.o. Admit date: 06/12/2015   Date of Consult: 06/13/2015 Primary Physician: Kathi Simpers, MD Primary Cardiologist: [facility]  Chief Complaint: Chest discomfort, angina Reason for Consult: CAD, angina  HPI: 69 y.o. female with coronary artery disease,  h/o bypass surgery in 2004, CLL probably for the past 2-3 years, diabetes, below the knee amputation for PAD, atrial fibrillation starting possibly in April 2016, noted again June 2016, now persistent, not on anticoagulation at this time, acute on chronic diastolic CHF initially April 2016, maintain on Lasix daily, presenting with several days of worsening chest discomfort, lower extremity edema, abdominal bloating.  Over the past several day she has been taking Lasix 40 mg twice a day for leg bloating, abdominal swelling. She is trying to watch her fluids, salt intake. Mild cough, orthopnea. She reports not having chest pain in the past, even prior to her bypass surgery.\ First found to have coronary disease after cardiac arrest in 2004, taken for three-vessel CABG, LIMA to the LAD, vein graft to the RCA, vein graft to an OM. No stenting or intervention since that time.  She reports having chronic infections of her legs leading to amputation. In hindsight family wonders if this could've been exacerbated by CLL. Recently seen by oncologist, cleared to start Coumadin for atrial fibrillation. She has not had follow-up with cardiology at [facility] to start the warfarin.  Reports having some chest pain this morning, sternal, some radiation to the left shoulder. Resolved without intervention  She reports having problems in the past with anemia. Started on iron supplementation with improvement  Past Medical History  Diagnosis Date  . CHF (congestive heart failure) ([place])   . Diabetes mellitus without complication ([place])   . Hypertension   . Cancer  ([place])     leukemia  . Amputation of right lower extremity below knee ([place])   . Amputation of left lower extremity below knee ([place])   . A-fib ([place])   . GERD (gastroesophageal reflux disease)   . Hyperlipidemia       Most Recent Cardiac Studies: Echocardiogram pending    Surgical History:  Past Surgical History  Procedure Laterality Date  . Abdominal hysterectomy    . Insert / replace / remove pacemaker    . Amputation lower extremity bilaterally Bilateral   . Coronary artery bypass graft       Home Meds: Prior to Admission medications   Medication Sig Start Date End Date Taking? Authorizing Provider  amitriptyline (ELAVIL) 25 MG tablet Take 25 mg by mouth every evening. 06/02/15  Yes Historical Provider, MD  amLODipine (NORVASC) 10 MG tablet Take 10 mg by mouth daily. 06/02/15  Yes Historical Provider, MD  amoxicillin (AMOXIL) 500 MG capsule Take 500 mg by mouth 3 (three) times daily. X 4 days 06/09/15  Yes Historical Provider, MD  aspirin 81 MG chewable tablet Chew 81 mg by mouth at bedtime.   Yes Historical Provider, MD  atorvastatin (LIPITOR) 40 MG tablet Take 40 mg by mouth daily. 06/02/15  Yes Historical Provider, MD  carvedilol (COREG) 25 MG tablet Take 25 mg by mouth 2 (two) times daily. 05/30/15  Yes Historical Provider, MD  Cholecalciferol (HM VITAMIN D3) 4000 UNITS CAPS Take 4,000 Units by mouth daily.   Yes Historical Provider, MD  furosemide (LASIX) 40 MG tablet Take 40 mg by mouth daily. 06/02/15  Yes Historical Provider, MD  HUMALOG 100 UNIT/ML injection Inject 10-14 Units into  the skin See admin instructions. Inject 12 units subcutaneous in the morning with breakfast, 10 units subcutaneous at lunch, and 14 units subcutaneous at suppertime. 05/28/15  Yes Historical Provider, MD  LANTUS 100 UNIT/ML injection Inject 14-35 Units into the skin See admin instructions. Inject 35 units subcutaneous once a day in the morning. Inject 14 units subcutaneous once a day at bedtime. 05/31/15   Yes Historical Provider, MD  losartan (COZAAR) 100 MG tablet Take 100 mg by mouth daily. 06/02/15  Yes Historical Provider, MD  Multiple Vitamins-Minerals (OCUTABS-LUTEIN) TABS Take 1 tablet by mouth daily.   Yes Historical Provider, MD  oxyCODONE-acetaminophen (PERCOCET/ROXICET) 5-325 MG tablet Take 1 tablet by mouth every 4 (four) hours as needed. For pain. 06/09/15  Yes Historical Provider, MD  pantoprazole (PROTONIX) 40 MG tablet Take 40 mg by mouth daily. 05/24/15  Yes Historical Provider, MD  POLY-IRON 150 150 MG capsule Take 150 mg by mouth daily. 05/24/15  Yes Historical Provider, MD  PREVIDENT 5000 DRY MOUTH 1.1 % GEL dental gel Take by mouth daily. Brush teeth with paste for at least 1 minute once a day. 03/25/15  Yes Historical Provider, MD  sertraline (ZOLOFT) 100 MG tablet Take 100 mg by mouth daily. 06/02/15  Yes Historical Provider, MD  vitamin C (ASCORBIC ACID) 500 MG tablet Take 500 mg by mouth daily.   Yes Historical Provider, MD    Inpatient Medications:  . amitriptyline  25 mg Oral QPM  . aspirin  81 mg Oral QHS  . atorvastatin  40 mg Oral Daily  . beta carotene w/minerals  1 tablet Oral Daily  . cholecalciferol  4,000 Units Oral Daily  . furosemide  60 mg Intravenous Daily  . heparin  5,000 Units Subcutaneous 3 times per day  . insulin aspart  0-5 Units Subcutaneous QHS  . insulin aspart  0-9 Units Subcutaneous TID WC  . insulin glargine  14 Units Subcutaneous QHS  . insulin glargine  35 Units Subcutaneous Daily  . iron polysaccharides  150 mg Oral Daily  . pantoprazole  40 mg Oral QAC breakfast  . sertraline  100 mg Oral Daily  . sodium chloride  3 mL Intravenous Q12H  . vitamin C  500 mg Oral Daily      Allergies:  Allergies  Allergen Reactions  . Zosyn [Piperacillin Sod-Tazobactam So] Other (See Comments)    Renal failure    Social History   Social History  . Marital Status: Divorced    Spouse Name: N/A  . Number of Children: N/A  . Years of Education:  N/A   Occupational History  . Not on file.   Social History Main Topics  . Smoking status: Never Smoker   . Smokeless tobacco: Not on file  . Alcohol Use: No  . Drug Use: No  . Sexual Activity: Not on file   Other Topics Concern  . Not on file   Social History Narrative  . No narrative on file     Family History  Problem Relation Age of Onset  . CAD Mother   . Diabetes Mother   . Atrial fibrillation Mother   . Lung cancer Father   . Diabetes Father      Review of Systems: tele showing atrial fib with vent rate in the high 40s Review of Systems  Constitutional: Negative.   Respiratory: Positive for shortness of breath.   Cardiovascular: Positive for chest pain and leg swelling.  Gastrointestinal: Negative.   Musculoskeletal: Negative.   Neurological:  Negative.   Psychiatric/Behavioral: Negative.   All other systems reviewed and are negative.  All other systems were reviewed and negative.   Labs:  Recent Labs  06/12/15 1706 06/12/15 2117 06/13/15 0122  TROPONINI <0.03 <0.03 <0.03   Lab Results  Component Value Date   WBC 26.0* 06/13/2015   HGB 10.9* 06/13/2015   HCT 34.3* 06/13/2015   MCV 83.7 06/13/2015   PLT 151 06/13/2015    Recent Labs Lab 06/13/15 0122  NA 134*  K 4.1  CL 102  CO2 23  BUN 47*  CREATININE 2.38*  CALCIUM 8.3*  GLUCOSE 243*   Lab Results  Component Value Date   CHOL 91 06/13/2015   HDL 25* 06/13/2015   LDLCALC 40 06/13/2015   TRIG 128 06/13/2015   No results found for: DDIMER  Radiology/Studies:  Dg Chest 1 View  06/12/2015   CLINICAL DATA:  Chest pain  EXAM: CHEST 1 VIEW  COMPARISON:  04/26/2011  FINDINGS: Cardiac enlargement. Median sternotomy. AICD in satisfactory position and unchanged  Mild vascular congestion consistent with fluid overload. No edema or effusion.  IMPRESSION: Mild fluid overload without edema.   Electronically Signed   By: Franchot Gallo M.D.   On: 06/12/2015 17:42    EKG: EKG on today's visit  shows atrial fibrillation with slow ventricular rate in the high 40s, intermittent paced beats  Weights: Filed Weights   06/12/15 1701 06/12/15 2002  Weight: 193 lb (87.544 kg) 246 lb 9.6 oz (111.857 kg)     Physical Exam: Telemetry as above, atrial fibrillation with slow ventricular rate  Blood pressure 124/57, pulse 49, temperature 97.7 F (36.5 C), temperature source Oral, resp. rate 18, height 5\' 8"  (1.727 m), weight 246 lb 9.6 oz (111.857 kg), SpO2 91 %. Body mass index is 37.5 kg/(m^2). General: Well developed, well nourished, in no acute distress. Head: Normocephalic, atraumatic, sclera non-icteric, no xanthomas, nares are without discharge.  Neck: Negative for carotid bruits. JVD not elevated. Lungs: Clear bilaterally to auscultation without wheezes, rales, or rhonchi. Breathing is unlabored. Heart: RRR with S1 S2. No murmurs, rubs, or gallops appreciated. Abdomen: Soft, non-tender, non-distended with normoactive bowel sounds. No hepatomegaly. No rebound/guarding. No obvious abdominal masses. Msk:  Strength and tone appear normal for age. Extremities: No clubbing or cyanosis. No edema.  Distal pedal pulses are 2+ and equal bilaterally. Neuro: Alert and oriented X 3. No facial asymmetry. No focal deficit. Moves all extremities spontaneously. Psych:  Responds to questions appropriately with a normal affect.    Assessment and Plan:  69 year old woman with coronary artery disease, bypass surgery in 2004, diabetes, CLL, chronic renal failure, atrial fibrillation, persistent, presenting with angina pectoris .  1) angina pectoris   cardiac enzymes negative 3, EKG appears to be at her baseline, no acute signs of ischemia. Continues to have stuttering low-grade sternal discomfort radiating to left shoulder. After long discussion with her, we will schedule a pharmacologic Myoview tomorrow. We'll hold amlodipine and carvedilol this evening in preparation. No caffeine for 24  hours. --Orders have been placed for Myoview If this shows large region of ischemia, staged procedure could be done given her renal failure --We did discuss her medications and we could hold amlodipine given her history of heart failure, would start isosorbide mononitrate 30 mg daily or twice a day. Continue aspirin for now. Will need to start warfarin for atrial fibrillation.   2)  coronary artery disease, CABG 3   currently with anginal symptoms. Stress test as above  Enzymes negative --- Other medications that can be used for angina as above, we'll start isosorbide after her stress test tomorrow, Holter amlodipine --- Could start ranexa as an outpatient if she continues to have angina   3) atrial fibrillation Persistent, dating back to June, possibly April High risk given her elevated chads vasc score, 6 or more We'll start warfarin depending on the details of her stress test. If no ischemia on stress test, would start warfarin ASAP  4) bradycardia She reports a long history of bradycardia, now though she is in atrial fibrillation. Symptomatic in terms of her heart failure, palpitations, general malaise. We'll decrease her carvedilol dose down to 12.5 mill grams twice a day, may need to change the rate parameters on her pacemaker to a higher heart rate. Will defer to primary cardiologist  5) CLL [place] count of 25. Patient thinks this is a long-standing issue for 2 or 3 years Recent seen by oncology. Given clearance per the patient to start warfarin  6) chronic renal failure Likely secondary to long-standing diabetes Stress test tomorrow, we'll try to avoid cardiac catheterization unless large region of ischemia noted on stress testing  Signed, Esmond Plants, MD [facility] 06/13/2015, 12:54 PM

## 2015-06-13 NOTE — Progress Notes (Signed)
[place] [facility] [place] NAME: Caroline Hernandez    MR#:  [id number]  DATE OF BIRTH:  October 11, 1945  SUBJECTIVE:  CHIEF COMPLAINT:   Chief Complaint  Patient presents with  . Chest Pain   She continues to have chest pressure which resolved with nitroglycerin. Being bradycardic on telemetry monitor. But asymptomatic with this.  REVIEW OF SYSTEMS:    Review of Systems  Constitutional: Negative for fever and chills.  HENT: Negative for sore throat.   Eyes: Negative for blurred vision, double vision and pain.  Respiratory: Negative for cough, hemoptysis, shortness of breath and wheezing.   Cardiovascular: Positive for chest pain. Negative for palpitations, orthopnea and leg swelling.  Gastrointestinal: Negative for heartburn, nausea, vomiting, abdominal pain, diarrhea and constipation.  Genitourinary: Negative for dysuria and hematuria.  Musculoskeletal: Negative for back pain and joint pain.  Skin: Negative for rash.  Neurological: Negative for sensory change, speech change, focal weakness and headaches.  Endo/Heme/Allergies: Does not bruise/bleed easily.  Psychiatric/Behavioral: Negative for depression. The patient is not nervous/anxious.       DRUG ALLERGIES:   Allergies  Allergen Reactions  . Zosyn [Piperacillin Sod-Tazobactam So] Other (See Comments)    Renal failure    VITALS:  Blood pressure 125/62, pulse 40, temperature 97.4 F (36.3 C), temperature source Oral, resp. rate 20, height 5\' 8"  (1.727 m), weight 111.857 kg (246 lb 9.6 oz), SpO2 92 %.  PHYSICAL EXAMINATION:   Physical Exam  GENERAL:  69 y.o.-year-old patient lying in the bed with no acute distress.  EYES: Pupils equal, round, reactive to light and accommodation. No scleral icterus. Extraocular muscles intact.  HEENT: Head atraumatic, normocephalic. Oropharynx and nasopharynx clear.  NECK:  Supple, no jugular venous distention. No thyroid enlargement, no  tenderness.  LUNGS: Normal breath sounds bilaterally, no wheezing, rales, rhonchi. No use of accessory muscles of respiration.  CARDIOVASCULAR: S1, S2 normal. No murmurs, rubs, or gallops.  ABDOMEN: Soft, nontender, nondistended. Bowel sounds present. No organomegaly or mass.  EXTREMITIES: No cyanosis, clubbing or edema b/l.   Bilateral below-knee amputation. NEUROLOGIC: Cranial nerves II through XII are intact. No focal Motor or sensory deficits b/l.   PSYCHIATRIC: The patient is alert and oriented x 3.  SKIN: No obvious rash, lesion, or ulcer.    LABORATORY PANEL:   CBC  Recent Labs Lab 06/13/15 0122  WBC 26.0*  HGB 10.9*  HCT 34.3*  PLT 151   ------------------------------------------------------------------------------------------------------------------  Chemistries   Recent Labs Lab 06/13/15 0122  NA 134*  K 4.1  CL 102  CO2 23  GLUCOSE 243*  BUN 47*  CREATININE 2.38*  CALCIUM 8.3*   ------------------------------------------------------------------------------------------------------------------  Cardiac Enzymes  Recent Labs Lab 06/13/15 0122  TROPONINI <0.03   ------------------------------------------------------------------------------------------------------------------  RADIOLOGY:  Dg Chest 1 View  06/12/2015   CLINICAL DATA:  Chest pain  EXAM: CHEST 1 VIEW  COMPARISON:  04/26/2011  FINDINGS: Cardiac enlargement. Median sternotomy. AICD in satisfactory position and unchanged  Mild vascular congestion consistent with fluid overload. No edema or effusion.  IMPRESSION: Mild fluid overload without edema.   Electronically Signed   By: Franchot Gallo M.D.   On: 06/12/2015 17:42     ASSESSMENT AND PLAN:   1. Unstable angina Chest pressure which improved with nitroglycerin. History of CABG. Normal stress test 3 years prior. Troponin has been normal in the hospital. Cardiology consult for further input regarding the case. Discussed with Dr. Rockey Situ.  Patient is not an ideal candidate  for cardiac catheterization due to kidney disease. On aspirin and statin. Coreg.  2. Acute on chronic systolic congestive heart failure. Has received IV Lasix. Presently seems euvolemic.  3. CKD stage III is stable.  4. Atrial fibrillation rate controlled on Coreg. As per prior cardiology note they are considering anticoagulation. Bradycardia. Patient mentions her pacemaker is set to rate of 40. Continue Coreg. Asymptomatic.  5. CLL  6. Diabetes type 2- continue Lantus and sliding scale.  7. Essential hypertension- continue current medications.  8. Gastroesophageal reflux disease without esophagitis on PPI as outpatient will continue while here.   All the records are reviewed and case discussed with Care Management/Social Worker. Management plans discussed with the patient, family and they are in agreement.  CODE STATUS: Full code  DVT Prophylaxis: SCDs  TOTAL TIME TAKING CARE OF THIS PATIENT: 35 minutes.   POSSIBLE D/C IN 1-2 DAYS, DEPENDING ON CLINICAL CONDITION.   Caroline Hernandez R M.D on 06/13/2015 at 11:40 AM  Between 7am to 6pm - Pager - [phone]  After 6pm go to [email or web address] - password [email or web address] [facility]  Office  [phone]  CC: Primary care physician; Caroline Simpers, MD    Note: This dictation was prepared with Dragon dictation along with smaller phrase technology. Any transcriptional errors that result from this process are unintentional.

## 2015-06-13 NOTE — Progress Notes (Signed)
Patient alert and oriented x4, no complaints at this time. vss at this time. Patient afib on telemetry. Will continue to assess. Wilnette Kales

## 2015-06-14 ENCOUNTER — Telehealth: Payer: Self-pay | Admitting: Cardiovascular Disease

## 2015-06-14 ENCOUNTER — Encounter: Payer: Self-pay | Admitting: Radiology

## 2015-06-14 ENCOUNTER — Observation Stay (HOSPITAL_BASED_OUTPATIENT_CLINIC_OR_DEPARTMENT_OTHER): Payer: Medicare HMO

## 2015-06-14 DIAGNOSIS — I209 Angina pectoris, unspecified: Secondary | ICD-10-CM

## 2015-06-14 DIAGNOSIS — I25791 Atherosclerosis of other coronary artery bypass graft(s) with angina pectoris with documented spasm: Secondary | ICD-10-CM | POA: Diagnosis not present

## 2015-06-14 DIAGNOSIS — R001 Bradycardia, unspecified: Secondary | ICD-10-CM | POA: Diagnosis not present

## 2015-06-14 DIAGNOSIS — I5043 Acute on chronic combined systolic (congestive) and diastolic (congestive) heart failure: Secondary | ICD-10-CM | POA: Diagnosis not present

## 2015-06-14 LAB — NM MYOCAR MULTI W/SPECT W/WALL MOTION / EF
CHL CUP NUCLEAR SRS: 29
CSEPHR: 49 %
LV sys vol: 90 mL
LVDIAVOL: 170 mL
Peak HR: 76 {beats}/min
Rest HR: 60 {beats}/min
SDS: 1
SSS: 22
TID: 0.96

## 2015-06-14 LAB — GLUCOSE, CAPILLARY
GLUCOSE-CAPILLARY: 166 mg/dL — AB (ref 65–99)
Glucose-Capillary: 171 mg/dL — ABNORMAL HIGH (ref 65–99)

## 2015-06-14 MED ORDER — INSULIN ASPART 100 UNIT/ML ~~LOC~~ SOLN
4.0000 [IU] | Freq: Three times a day (TID) | SUBCUTANEOUS | Status: DC
  Administered 2015-06-14: 4 [IU] via SUBCUTANEOUS
  Filled 2015-06-14: qty 4

## 2015-06-14 MED ORDER — ISOSORBIDE MONONITRATE ER 30 MG PO TB24: 30.0000 mg | ORAL_TABLET | Freq: Every day | ORAL | Status: DC

## 2015-06-14 MED ORDER — WARFARIN SODIUM 4 MG PO TABS: 4.0000 mg | ORAL_TABLET | Freq: Every day | ORAL | Status: DC

## 2015-06-14 MED ORDER — TECHNETIUM TC 99M SESTAMIBI - CARDIOLITE
30.7800 | Freq: Once | INTRAVENOUS | Status: AC | PRN
  Administered 2015-06-14: 10:00:00 30.78 via INTRAVENOUS

## 2015-06-14 MED ORDER — REGADENOSON 0.4 MG/5ML IV SOLN
0.4000 mg | Freq: Once | INTRAVENOUS | Status: AC
  Administered 2015-06-14: 0.4 mg via INTRAVENOUS
  Filled 2015-06-14: qty 5

## 2015-06-14 MED ORDER — ONDANSETRON HCL 4 MG/2ML IJ SOLN
4.0000 mg | Freq: Four times a day (QID) | INTRAMUSCULAR | Status: DC | PRN
  Administered 2015-06-14: 4 mg via INTRAMUSCULAR
  Filled 2015-06-14: qty 2

## 2015-06-14 MED ORDER — INFLUENZA VAC SPLIT QUAD 0.5 ML IM SUSY: 0.5000 mL | PREFILLED_SYRINGE | INTRAMUSCULAR | Status: DC

## 2015-06-14 MED ORDER — INFLUENZA VAC SPLIT QUAD 0.5 ML IM SUSY
0.5000 mL | PREFILLED_SYRINGE | Freq: Once | INTRAMUSCULAR | Status: AC
  Administered 2015-06-14: 0.5 mL via INTRAMUSCULAR
  Filled 2015-06-14: qty 0.5

## 2015-06-14 MED ORDER — NITROGLYCERIN 0.4 MG SL SUBL: 0.4000 mg | SUBLINGUAL_TABLET | SUBLINGUAL | Status: DC | PRN

## 2015-06-14 MED ORDER — TECHNETIUM TC 99M SESTAMIBI - CARDIOLITE
13.8700 | Freq: Once | INTRAVENOUS | Status: AC | PRN
  Administered 2015-06-14: 08:00:00 13.87 via INTRAVENOUS

## 2015-06-14 NOTE — Progress Notes (Signed)
Pt. D/c home with daughter discharge teaching given with teach back. IV access removed without difficulty. Follow up appointment given

## 2015-06-14 NOTE — Discharge Summary (Signed)
[facility] NAME: Caroline Hernandez    MR#:  [id number]  DATE OF BIRTH:  1946-04-24  DATE OF ADMISSION:  06/12/2015 ADMITTING PHYSICIAN: Loletha Grayer, MD  DATE OF DISCHARGE: 06/14/2015 PRIMARY CARE PHYSICIAN: TEASDALL,ROBERT, MD    ADMISSION DIAGNOSIS:  Acute on chronic renal failure (HCC) [N17.9, N18.9] Chest pain, unspecified chest pain type [R07.9]  DISCHARGE DIAGNOSIS:  Active Problems:   Chest pain   Acute on chronic renal failure (HCC)   Angina pectoris ([place])   Coronary artery disease involving native coronary artery of native heart with angina pectoris with documented spasm (HCC)   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 CHF (congestive heart failure) ([place])   . Diabetes mellitus without complication ([place])   . Hypertension   . Cancer ([place])     leukemia  . Amputation of right lower extremity below knee ([place])   . Amputation of left lower extremity below knee ([place])   . A-fib ([place])   . GERD (gastroesophageal reflux disease)   . Hyperlipidemia     HOSPITAL COURSE:  69 year old woman with coronary artery disease, bypass surgery in 2004, diabetes, CLL, chronic renal failure, atrial fibrillation, persistent, presenting with angina pectoris .  1) angina pectoris  cardiac enzymes negative 3, EKG appears to be at her baseline, no acute signs of ischemia. She underwent stress test.  2) coronary artery disease, CABG 3  patient was started on isosorbide in place of norvasc We started coumadin for atrial fibrillation so for now ASA is on hold. As per cardiology, ould start ranexa as an outpatient if she continues to have angina   3) atrial fibrillation Persistent, dating back to June, possibly April High risk given her elevated chads vasc score, 6 or more SHe was  started on coumadin. SE,alternativesand risks were discussed with her and she chooses to accept these risks and start coumadin. She will follow up with Dr Rockey Situ for INR checks.  4) bradycardia: She reports a long history of bradycardia, now though she is in atrial fibrillation. Her carvedilol was decreased.  5) CLL Recent seen by oncology. Given clearance per the patient to start warfarin  6) chronic renal failure Likely secondary to long-standing diabetes   DISCHARGE CONDITIONS AND DIET:  Home stable condition Heart healthy diet  CONSULTS OBTAINED:  Treatment Team:  Loletha Grayer, MD Minna Merritts, MD  DRUG ALLERGIES:   Allergies  Allergen Reactions  . Zosyn [Piperacillin Sod-Tazobactam So] Other (See Comments)    Renal failure    DISCHARGE MEDICATIONS:   Current Discharge Medication List    START taking these medications   Details  isosorbide mononitrate (IMDUR) 30 MG 24 hr tablet Take 1 tablet (30 mg total) by mouth daily. Qty: 30 tablet, Refills: 0    nitroGLYCERIN (NITROSTAT) 0.4 MG SL tablet Place 1 tablet (0.4 mg total) under the tongue every 5 (five) minutes as needed for chest pain. Qty: 30 tablet, Refills: 0    warfarin (COUMADIN) 4 MG tablet Take 1 tablet (4 mg total) by mouth daily. Qty: 30 tablet, Refills: 0      CONTINUE these medications which have NOT CHANGED   Details  amitriptyline (ELAVIL) 25 MG tablet Take 25 mg by mouth every evening. Refills: 11    amoxicillin (AMOXIL) 500 MG capsule Take 500 mg by  mouth 3 (three) times daily. X 4 days Refills: 0    atorvastatin (LIPITOR) 40 MG tablet Take 40 mg by mouth daily. Refills: 11    carvedilol (COREG) 25 MG tablet Take 25 mg by mouth 2 (two) times daily. Refills: 11    Cholecalciferol (HM VITAMIN D3) 4000 UNITS CAPS Take 4,000 Units by mouth daily.    furosemide (LASIX) 40 MG tablet Take 40 mg by mouth daily. Refills: 0    HUMALOG 100 UNIT/ML injection Inject 10-14 Units into the  skin See admin instructions. Inject 12 units subcutaneous in the morning with breakfast, 10 units subcutaneous at lunch, and 14 units subcutaneous at suppertime. Refills: 6    LANTUS 100 UNIT/ML injection Inject 14-35 Units into the skin See admin instructions. Inject 35 units subcutaneous once a day in the morning. Inject 14 units subcutaneous once a day at bedtime. Refills: 0    losartan (COZAAR) 100 MG tablet Take 100 mg by mouth daily. Refills: 11    Multiple Vitamins-Minerals (OCUTABS-LUTEIN) TABS Take 1 tablet by mouth daily.    oxyCODONE-acetaminophen (PERCOCET/ROXICET) 5-325 MG tablet Take 1 tablet by mouth every 4 (four) hours as needed. For pain. Refills: 0    pantoprazole (PROTONIX) 40 MG tablet Take 40 mg by mouth daily. Refills: 0    POLY-IRON 150 150 MG capsule Take 150 mg by mouth daily. Refills: 3    PREVIDENT 5000 DRY MOUTH 1.1 % GEL dental gel Take by mouth daily. Brush teeth with paste for at least 1 minute once a day. Refills: 5    sertraline (ZOLOFT) 100 MG tablet Take 100 mg by mouth daily. Refills: 11    vitamin C (ASCORBIC ACID) 500 MG tablet Take 500 mg by mouth daily.      STOP taking these medications     amLODipine (NORVASC) 10 MG tablet      aspirin 81 MG chewable tablet               Today   CHIEF COMPLAINT:  Doing well this am no issues  Nausea after stress test  No abdominal pain VITAL SIGNS:  Blood pressure 149/59, pulse 65, temperature 97.7 F (36.5 C), temperature source Oral, resp. rate 18, height 5\' 8"  (1.727 m), weight 111.857 kg (246 lb 9.6 oz), SpO2 89 %.   REVIEW OF SYSTEMS:  Review of Systems  Constitutional: Negative for fever, chills and malaise/fatigue.  HENT: Negative for sore throat.   Eyes: Negative for blurred vision.  Respiratory: Negative for cough, hemoptysis, shortness of breath and wheezing.   Cardiovascular: Negative for chest pain, palpitations and leg swelling.  Gastrointestinal: Positive for  nausea. Negative for vomiting, abdominal pain, diarrhea and blood in stool.  Genitourinary: Negative for dysuria.  Musculoskeletal: Negative for back pain.  Neurological: Negative for dizziness, tremors and headaches.  Endo/Heme/Allergies: Does not bruise/bleed easily.     PHYSICAL EXAMINATION:  GENERAL:  69 y.o.-year-old patient lying in the bed with no acute distress.  NECK:  Supple, no jugular venous distention. No thyroid enlargement, no tenderness.  LUNGS: Normal breath sounds bilaterally, no wheezing, rales,rhonchi  No use of accessory muscles of respiration.  CARDIOVASCULAR: S1, S2 normal. No murmurs, rubs, or gallops.  ABDOMEN: Soft, non-tender, non-distended. Bowel sounds present. No organomegaly or mass.  EXTREMITIES: No pedal edema, cyanosis, or clubbing.  PSYCHIATRIC: The patient is alert and oriented x 3.  SKIN: No obvious rash, lesion, or ulcer.   DATA REVIEW:   CBC  Recent Labs Lab 06/13/15 0122  WBC 26.0*  HGB 10.9*  HCT 34.3*  PLT 151    Chemistries   Recent Labs Lab 06/13/15 0122  NA 134*  K 4.1  CL 102  CO2 23  GLUCOSE 243*  BUN 47*  CREATININE 2.38*  CALCIUM 8.3*    Cardiac Enzymes  Recent Labs Lab 06/12/15 1706 06/12/15 2117 06/13/15 0122  TROPONINI <0.03 <0.03 <0.03    Microbiology Results  @[email or web address] @  RADIOLOGY:  Dg Chest 1 View  06/12/2015   CLINICAL DATA:  Chest pain  EXAM: CHEST 1 VIEW  COMPARISON:  04/26/2011  FINDINGS: Cardiac enlargement. Median sternotomy. AICD in satisfactory position and unchanged  Mild vascular congestion consistent with fluid overload. No edema or effusion.  IMPRESSION: Mild fluid overload without edema.   Electronically Signed   By: Franchot Gallo M.D.   On: 06/12/2015 17:42      Management plans discussed with the patient and she is in agreement. Stable for discharge home  Patient should follow up with PCP  CODE STATUS:     Code Status Orders        Start     Ordered   06/12/15 1811   Full code   Continuous     06/12/15 1810    Advance Directive Documentation        Most Recent Value   Type of Advance Directive  Healthcare Power of Attorney   Pre-existing out of facility DNR order (yellow form or pink MOST form)     "MOST" Form in Place?        TOTAL TIME TAKING CARE OF THIS PATIENT: 35 minutes.    Nykeem Citro M.D on 06/14/2015 at 12:16 PM  Between 7am to 6pm - Pager - [phone] After 6pm go to [email or web address] - password [email or web address] [facility]  Office  [phone]  CC: Primary care physician; Kathi Simpers, MD

## 2015-06-14 NOTE — Progress Notes (Signed)
Patient: Caroline Hernandez / Admit Date: 06/12/2015 / Date of Encounter: 06/14/2015, 4:33 PM   Subjective: Feels well, rare stuttering chest pain. Uncertain if this is angina or GI related. Overall feels well Results of stress test discussed with her. Medications changes as detailed below Long discussion concerning anticoagulation. She would like to start warfarin She has been receiving Lasix with improvement of her shortness of breath   Review of Systems: ROS Review of Systems  Constitutional: Negative.  Respiratory: Positive for shortness of breath.  Cardiovascular: Positive for chest pain and leg swelling.  Gastrointestinal: Negative.  Musculoskeletal: Negative.  Neurological: Negative.  Psychiatric/Behavioral: Negative.  All other systems reviewed and are negative.   Objective: Telemetry:  Physical Exam: Blood pressure 149/59, pulse 65, temperature 97.7 F (36.5 C), temperature source Oral, resp. rate 18, height 5\' 8"  (1.727 m), weight 246 lb 9.6 oz (111.857 kg), SpO2 89 %. Body mass index is 37.5 kg/(m^2). General: Well developed, well nourished, in no acute distress. Head: Normocephalic, atraumatic, sclera non-icteric, no xanthomas, nares are without discharge. Neck: Negative for carotid bruits. JVP not elevated. Lungs: Clear bilaterally to auscultation without wheezes, rales, or rhonchi. Breathing is unlabored. Heart: RRR S1 S2 without murmurs, rubs, or gallops.  Abdomen: Soft, non-tender, non-distended with normoactive bowel sounds. No rebound/guarding. Extremities: No clubbing or cyanosis. No edema. Distal pedal pulses are 2+ and equal bilaterally. Neuro: Alert and oriented X 3. Moves all extremities spontaneously. Psych:  Responds to questions appropriately with a normal affect.   Intake/Output Summary (Last 24 hours) at 06/14/15 1633 Last data filed at 06/14/15 1333  Gross per 24 hour  Intake    480 ml  Output      0 ml  Net    480 ml    Inpatient  Medications:  . amitriptyline  25 mg Oral QPM  . aspirin  81 mg Oral QHS  . atorvastatin  40 mg Oral Daily  . beta carotene w/minerals  1 tablet Oral Daily  . cholecalciferol  4,000 Units Oral Daily  . furosemide  60 mg Intravenous Daily  . heparin  5,000 Units Subcutaneous 3 times per day  . insulin aspart  0-5 Units Subcutaneous QHS  . insulin aspart  0-9 Units Subcutaneous TID WC  . insulin aspart  4 Units Subcutaneous TID WC  . insulin glargine  14 Units Subcutaneous QHS  . insulin glargine  35 Units Subcutaneous Daily  . iron polysaccharides  150 mg Oral Daily  . pantoprazole  40 mg Oral QAC breakfast  . sertraline  100 mg Oral Daily  . sodium chloride  3 mL Intravenous Q12H  . vitamin C  500 mg Oral Daily   Infusions:    Labs:  Recent Labs  06/12/15 1706 06/13/15 0122  NA 132* 134*  K 4.1 4.1  CL 101 102  CO2 24 23  GLUCOSE 176* 243*  BUN 46* 47*  CREATININE 2.48* 2.38*  CALCIUM 8.4* 8.3*   No results for input(s): AST, ALT, ALKPHOS, BILITOT, PROT, ALBUMIN in the last 72 hours.  Recent Labs  06/12/15 1706 06/13/15 0122  WBC 24.1* 26.0*  NEUTROABS 6.3  --   HGB 11.6* 10.9*  HCT 35.6 34.3*  MCV 82.7 83.7  PLT 148* 151    Recent Labs  06/12/15 1706 06/12/15 2117 06/13/15 0122  TROPONINI <0.03 <0.03 <0.03   Invalid input(s): POCBNP No results for input(s): HGBA1C in the last 72 hours.   Weights: Filed Weights   06/12/15 1701 06/12/15  2002  Weight: 193 lb (87.544 kg) 246 lb 9.6 oz (111.857 kg)     Radiology/Studies:  Dg Chest 1 View  06/12/2015   CLINICAL DATA:  Chest pain  EXAM: CHEST 1 VIEW  COMPARISON:  04/26/2011  FINDINGS: Cardiac enlargement. Median sternotomy. AICD in satisfactory position and unchanged  Mild vascular congestion consistent with fluid overload. No edema or effusion.  IMPRESSION: Mild fluid overload without edema.   Electronically Signed   By: Franchot Gallo M.D.   On: 06/12/2015 17:42   Nm Myocar Multi W/spect W/wall  Motion / Ef  06/14/2015    There was no ST segment deviation noted during stress.   Pharmacological myocardial perfusion imaging study with no significant  ischemia Old scar noted in mid to distal anteroseptal and septal region Wall motion abnormality in the mid to distal anterioseptal and septal  region EF estimated at 48% No EKG changes concerning for ischemia.  Low risk scan  Signed, Esmond Plants, MD      Assessment and Plan  69 year old woman with coronary artery disease, bypass surgery in 2004, diabetes, CLL, chronic renal failure, atrial fibrillation, persistent, presenting with angina pectoris .  1) angina pectoris  cardiac enzymes negative 3, EKG appears to be at her baseline, no acute signs of ischemia. Stress test performed today shows fixed perfusion defect consistent with old scar in the mid to apical anteroseptal and septal region. Ejection fraction 48%. Wall motion abnormality in the region of old infarct --Results were discussed with the patient in detail. Unable to exclude GI symptoms as a cause of her presentation. She does report history of hiatal hernia. She is on a PPI. She will take H2 blockers when necessary with Tums. --- Recommended she hold her amlodipine, start isosorbide mononitrate 30 mg daily If symptoms persist, she could take Ranexa  2) coronary artery disease, CABG 3  currently with anginal symptoms.  Enzymes negative Negative stress test as above  3) atrial fibrillation Persistent, dating back to June, possibly April High risk given her elevated chads vasc score, 6 or more Would start warfarin  We will arrange INR check/nurse visit on Friday, October 7, repeat check October 12 in our clinic  4) bradycardia She reports a long history of bradycardia, now though she is in atrial fibrillation. Symptomatic in terms of her heart failure, palpitations, general malaise. Consider decreasing carvedilol down to 12.5 mg twice a day  5) CLL Hainline count of  25. Patient thinks this is a long-standing issue for 2 or 3 years Recent seen by oncology. Given clearance per the patient to start warfarin  6) chronic renal failure Likely secondary to long-standing diabetes  7)acute on chronic diastolic and-systolicCHF. Recommended she take Lasix aggressively daily, even twice a day for shortness of breath symptoms, abdominal bloating.worsening symptoms recently likely secondary to atrial fibrillation. --Recommended she take potassium with her Lasix Close follow-up in clinic   Signed, Esmond Plants, MD 06/14/2015, 4:33 PM

## 2015-06-14 NOTE — Progress Notes (Signed)
Inpatient Diabetes Program Recommendations  AACE/ADA: New Consensus Statement on Inpatient Glycemic Control (2015)  Target Ranges:  Prepandial:   less than 140 mg/dL      Peak postprandial:   less than 180 mg/dL (1-2 hours)      Critically ill patients:  140 - 180 mg/dL  Results for Caroline Hernandez, Caroline Hernandez (MRN [id number]) as of 06/14/2015 10:43  Ref. Range 06/13/2015 08:21 06/13/2015 11:44 06/13/2015 16:27 06/13/2015 20:38 06/14/2015 07:40  Glucose-Capillary Latest Ref Range: 65-99 mg/dL 199 (H) 217 (H) 216 (H) 208 (H) 166 (H)   Review of Glycemic Control  Diabetes history: DM2 Outpatient Diabetes medications: Lantus  35 units QAM, Lantus 14 units QHS, Humalog 12 units with breakfast, Humalog 10 units with lunch, Humalog 14 units with supper Current orders for Inpatient glycemic control: Lantus 35 units daily, Lantus 14 units QHS, Novolog 0-9 units TID with meals, Novolog 0-5 units HS  Inpatient Diabetes Program Recommendations: Insulin - Meal Coverage: Noted post prandial glucose elevated yesterday. Patient was made NPO after midnight last night for myoview this morning. Once diet is resumed, please consider ordering Novolog 4 units TID with meals for meal coverage.  Thanks, Barnie Alderman, RN, MSN, CCRN, CDE Diabetes Coordinator Inpatient Diabetes Program [phone] (Team Pager from [place] to [place]) [phone] (AP office) [phone] [facility] office) [phone] [facility] office)

## 2015-06-14 NOTE — Progress Notes (Signed)
   Patient is for [facility] this morning. No further chest pain. She tolerated procedure well. Results to come later this afternoon/evening.    Christell Faith, PA-C 06/14/2015 10:09 AM

## 2015-06-14 NOTE — Telephone Encounter (Signed)
Caroline Hernandez, I saw Caroline Hernandez in the hospital. She may need CHF clinic follow-up. Worsening shortness of breath recently likely secondary to acute on chronic combined systolic and diastolic CHF. Worse now in the setting of atrial fibrillation. Ideally should be taking Lasix twice a day for symptoms, daily if she feels well. Significant coronary disease, recent stress test with no ischemia.  Would you be able to help me with her? very lady thx Alford Highland

## 2015-06-14 NOTE — Telephone Encounter (Signed)
Patient was recently seen in the hospital, discharged 06/14/2015  Was started on warfarin at the time of discharge. She will need nurse visit for INR check in our office on October 7 Also needs new patient appointment with Coumadin clinic on October 12 thx  Needs appointment with me or Thurmond Butts in the next few weeks

## 2015-06-15 NOTE — Telephone Encounter (Signed)
Be glad to. Just forward the information to [place] or have Leafy Ro call Estill Bamberg with patient's phone number. Thanks!

## 2015-06-15 NOTE — Telephone Encounter (Signed)
Need to arrange CHF clinic follow-up thx

## 2015-06-16 NOTE — Telephone Encounter (Signed)
Caroline Hernandez, Can you set her up an appt to see Otila Kluver? Thank you!

## 2015-06-17 ENCOUNTER — Encounter: Payer: Medicare HMO | Admitting: Cardiovascular Disease

## 2015-06-18 ENCOUNTER — Ambulatory Visit (INDEPENDENT_AMBULATORY_CARE_PROVIDER_SITE_OTHER): Payer: Medicare HMO | Admitting: Cardiovascular Disease

## 2015-06-18 ENCOUNTER — Encounter: Payer: Self-pay | Admitting: Cardiovascular Disease

## 2015-06-18 VITALS — BP 157/79 | HR 45 | Ht 68.0 in | Wt 243.0 lb

## 2015-06-18 DIAGNOSIS — I5043 Acute on chronic combined systolic (congestive) and diastolic (congestive) heart failure: Secondary | ICD-10-CM | POA: Diagnosis not present

## 2015-06-18 DIAGNOSIS — I25701 Atherosclerosis of coronary artery bypass graft(s), unspecified, with angina pectoris with documented spasm: Secondary | ICD-10-CM

## 2015-06-18 DIAGNOSIS — I10 Essential (primary) hypertension: Secondary | ICD-10-CM

## 2015-06-18 DIAGNOSIS — N189 Chronic kidney disease, unspecified: Secondary | ICD-10-CM

## 2015-06-18 DIAGNOSIS — I25791 Atherosclerosis of other coronary artery bypass graft(s) with angina pectoris with documented spasm: Secondary | ICD-10-CM

## 2015-06-18 DIAGNOSIS — N179 Acute kidney failure, unspecified: Secondary | ICD-10-CM

## 2015-06-18 DIAGNOSIS — I481 Persistent atrial fibrillation: Secondary | ICD-10-CM | POA: Diagnosis not present

## 2015-06-18 DIAGNOSIS — I4819 Other persistent atrial fibrillation: Secondary | ICD-10-CM

## 2015-06-18 DIAGNOSIS — R001 Bradycardia, unspecified: Secondary | ICD-10-CM

## 2015-06-18 DIAGNOSIS — I25111 Atherosclerotic heart disease of native coronary artery with angina pectoris with documented spasm: Secondary | ICD-10-CM | POA: Diagnosis not present

## 2015-06-18 DIAGNOSIS — I209 Angina pectoris, unspecified: Secondary | ICD-10-CM | POA: Diagnosis not present

## 2015-06-18 MED ORDER — FUROSEMIDE 40 MG PO TABS: 40.0000 mg | ORAL_TABLET | Freq: Two times a day (BID) | ORAL | Status: DC

## 2015-06-18 MED ORDER — ISOSORBIDE MONONITRATE ER 30 MG PO TB24: 30.0000 mg | ORAL_TABLET | Freq: Two times a day (BID) | ORAL | Status: DC

## 2015-06-18 NOTE — Progress Notes (Signed)
Patient ID: Keryl Bredesen, female    DOB: 06-23-1946, 69 y.o.   MRN: [id number]  HPI Comments: 69 y.o. female with coronary artery disease, h/o bypass surgery in 2004, CLL probably for the past 2-3 years, diabetes, below the knee amputation for PAD, atrial fibrillation starting possibly in April 2016, noted again June 2016, now persistent, not on anticoagulation at this time, acute on chronic diastolic CHF initially April 2016, maintain on Lasix daily, recently admitted to the hospital with diastolic CHF/leg swelling, abdominal bloating, angina, who presents to establish care in the [place] office  Since her discharge, she reports that she has felt better after the various medication changes. Amlodipine was held, she was started on isosorbide 30 mg daily, started on warfarin, encouraged to take Lasix 40 mg twice a day Less leg swelling, shortness of breath has improved, more energy. Denies having any further chest pain symptoms. Presents today and a wheelchair with bilateral leg prostheses Heart rate continues to run low at home It had been recommended she decrease the carvedilol down to 12.5 mill grams twice a day, she has stayed on the 25 mg twice a day Denies any lightheadedness or dizziness. She was started on warfarin at the time of discharge INR today 1.1. She has been taking 4 mg daily  EKG on today's visit shows atrial fibrillation with ventricular rate 45 bpm, intraventricular conduction delay, left anterior fascicular block  Other past medical history History of coronary disease with cardiac arrest in 2004, taken for three-vessel CABG, LIMA to the LAD, vein graft to the RCA, vein graft to an OM. No stenting or intervention since that time.  She reports having chronic infections of her legs leading to amputation. In hindsight family wonders if this could've been exacerbated by CLL. Recently seen by oncologist, cleared to start Coumadin for atrial fibrillation. She has not had  follow-up with cardiology at [facility] to start the warfarin.  She reports having problems in the past with anemia. Started on iron supplementation with improvement   Allergies  Allergen Reactions  . Zosyn [Piperacillin Sod-Tazobactam So] Other (See Comments)    Renal failure    Current Outpatient Prescriptions on File Prior to Visit  Medication Sig Dispense Refill  . amitriptyline (ELAVIL) 25 MG tablet Take 25 mg by mouth every evening.  11  . atorvastatin (LIPITOR) 40 MG tablet Take 40 mg by mouth daily.  11  . carvedilol (COREG) 25 MG tablet Take 25 mg by mouth 2 (two) times daily.  11  . Cholecalciferol (HM VITAMIN D3) 4000 UNITS CAPS Take 4,000 Units by mouth daily.    Marland Kitchen HUMALOG 100 UNIT/ML injection Inject 10-14 Units into the skin See admin instructions. Inject 12 units subcutaneous in the morning with breakfast, 10 units subcutaneous at lunch, and 14 units subcutaneous at suppertime.  6  . LANTUS 100 UNIT/ML injection Inject 14-35 Units into the skin See admin instructions. Inject 35 units subcutaneous once a day in the morning. Inject 14 units subcutaneous once a day at bedtime.  0  . losartan (COZAAR) 100 MG tablet Take 100 mg by mouth daily.  11  . Multiple Vitamins-Minerals (OCUTABS-LUTEIN) TABS Take 1 tablet by mouth daily.    . nitroGLYCERIN (NITROSTAT) 0.4 MG SL tablet Place 1 tablet (0.4 mg total) under the tongue every 5 (five) minutes as needed for chest pain. 30 tablet 0  . pantoprazole (PROTONIX) 40 MG tablet Take 40 mg by mouth daily.  0  . POLY-IRON 150 150 MG  capsule Take 150 mg by mouth daily.  3  . PREVIDENT 5000 DRY MOUTH 1.1 % GEL dental gel Take by mouth daily. Brush teeth with paste for at least 1 minute once a day.  5  . sertraline (ZOLOFT) 100 MG tablet Take 100 mg by mouth daily.  11  . vitamin C (ASCORBIC ACID) 500 MG tablet Take 500 mg by mouth daily.    Marland Kitchen warfarin (COUMADIN) 4 MG tablet Take 1 tablet (4 mg total) by mouth daily. 30 tablet 0   No  current facility-administered medications on file prior to visit.    Past Medical History  Diagnosis Date  . CHF (congestive heart failure) ([place])   . Diabetes mellitus without complication ([place])   . Hypertension   . Amputation of right lower extremity below knee ([place])   . Amputation of left lower extremity below knee ([place])   . A-fib ([place])   . GERD (gastroesophageal reflux disease)   . Hyperlipidemia   . Cancer ([place])     leukemia  . Renal failure     Past Surgical History  Procedure Laterality Date  . Abdominal hysterectomy    . Insert / replace / remove pacemaker    . Amputation lower extremity bilaterally Bilateral   . Coronary artery bypass graft    . Cardiac catheterization      Social History  reports that she has never smoked. She does not have any smokeless tobacco history on file. She reports that she does not drink alcohol or use illicit drugs.  Family History family history includes Atrial fibrillation in her mother; CAD in her mother; Diabetes in her father and mother; Lung cancer in her father.    Review of Systems  Constitutional: Negative.   Respiratory: Negative.   Cardiovascular: Negative.   Gastrointestinal: Negative.   Musculoskeletal: Positive for gait problem.  Skin: Negative.   Allergic/Immunologic: Negative.   Neurological: Negative.   Hematological: Negative.   Psychiatric/Behavioral: Negative.   All other systems reviewed and are negative.   BP 157/79 mmHg  Pulse 45  Ht 5\' 8"  (1.727 m)  Wt 243 lb (110.224 kg)  BMI 36.96 kg/m2  Physical Exam  Constitutional: She is oriented to person, place, and time. She appears well-developed and well-nourished.  Patient has bilateral amputations of the legs above the knee  HENT:  Head: Normocephalic.  Nose: Nose normal.  Mouth/Throat: Oropharynx is clear and moist.  Eyes: Conjunctivae are normal. Pupils are equal, round, and reactive to light.  Neck: Normal range of motion. Neck supple. No JVD  present.  Cardiovascular: Normal rate, regular rhythm, normal heart sounds and intact distal pulses.  Exam reveals no gallop and no friction rub.   No murmur heard. Pulmonary/Chest: Effort normal and breath sounds normal. No respiratory distress. She has no wheezes. She has no rales. She exhibits no tenderness.  Abdominal: Soft. Bowel sounds are normal. She exhibits no distension. There is no tenderness.  Musculoskeletal: Normal range of motion. She exhibits no edema or tenderness.  Lymphadenopathy:    She has no cervical adenopathy.  Neurological: She is alert and oriented to person, place, and time. Coordination normal.  Skin: Skin is warm and dry. No rash noted. No erythema.  Psychiatric: She has a normal mood and affect. Her behavior is normal. Judgment and thought content normal.

## 2015-06-18 NOTE — Assessment & Plan Note (Signed)
Asymptomatic persistent atrial fibrillation since April 2016. Previously managed by cardiology at [facility] Warfarin started as Chads vasc elevated 6 or higher

## 2015-06-18 NOTE — Assessment & Plan Note (Signed)
As detailed above, medication changes made, recent negative stress test. Currently not having chest pain

## 2015-06-18 NOTE — Assessment & Plan Note (Signed)
Angina on presentation to the hospital Poor candidate for cardiac catheterization given creatinine greater than 2 Started on isosorbide, currently feeling better with no anginal symptoms We'll increase isosorbide up to 30 mg twice a day for blood pressure control

## 2015-06-18 NOTE — Assessment & Plan Note (Signed)
Recent admission to the hospital. Stress test showing no ischemia No further workup at this time

## 2015-06-18 NOTE — Assessment & Plan Note (Signed)
Recommended she stay on Lasix 40 mg daily, extra Lasix after lunch for any leg edema, abdominal bloating or weight gain

## 2015-06-18 NOTE — Patient Instructions (Addendum)
You are doing well.  Please take warfarin 4 mg on 4 days a week and 6 mg on 3 days a week (mon/wed/fri)  Please cut the coreg/carvedilol in 1/2 twice a day  For high blood pressure, Increase the isosorbide up to 30 mg twice a day  If blood pressure continues to run high Take a 1/2 amlodipine once a day  Please call us if you have new issues that need to be addressed before your next appt.  Your physician wants you to follow-up in: 1 month.

## 2015-06-18 NOTE — Assessment & Plan Note (Signed)
For her bradycardia, we have recommended she decrease the carvedilol down to 12.5 mg twice a day Heart rate in the 40s still with some fatigue

## 2015-06-18 NOTE — Assessment & Plan Note (Signed)
Baseline creatinine 2.0 or higher. We will need follow-up with renal

## 2015-06-18 NOTE — Assessment & Plan Note (Signed)
Elevated blood pressure on today's visit. Recommended she increase the isosorbide up to 30 mg twice a day If blood pressure continues to run high, could restart amlodipine 5 mg daily

## 2015-06-18 NOTE — Telephone Encounter (Signed)
I called and left a message for the patient. Today we will schedule if she calls back. Thanks for the referral.

## 2015-06-23 ENCOUNTER — Ambulatory Visit (INDEPENDENT_AMBULATORY_CARE_PROVIDER_SITE_OTHER): Payer: Medicare HMO

## 2015-06-23 DIAGNOSIS — Z7901 Long term (current) use of anticoagulants: Secondary | ICD-10-CM

## 2015-06-23 DIAGNOSIS — I4819 Other persistent atrial fibrillation: Secondary | ICD-10-CM

## 2015-06-23 DIAGNOSIS — I481 Persistent atrial fibrillation: Secondary | ICD-10-CM | POA: Diagnosis not present

## 2015-06-23 LAB — POCT INR: INR: 1.4

## 2015-06-23 NOTE — Patient Instructions (Signed)

## 2015-06-30 ENCOUNTER — Ambulatory Visit (INDEPENDENT_AMBULATORY_CARE_PROVIDER_SITE_OTHER): Payer: Medicare HMO

## 2015-06-30 DIAGNOSIS — Z7901 Long term (current) use of anticoagulants: Secondary | ICD-10-CM | POA: Diagnosis not present

## 2015-06-30 DIAGNOSIS — I4819 Other persistent atrial fibrillation: Secondary | ICD-10-CM

## 2015-06-30 DIAGNOSIS — I481 Persistent atrial fibrillation: Secondary | ICD-10-CM | POA: Diagnosis not present

## 2015-06-30 LAB — POCT INR: INR: 1.6

## 2015-07-05 ENCOUNTER — Telehealth: Payer: Self-pay | Admitting: *Deleted

## 2015-07-05 MED ORDER — WARFARIN SODIUM 4 MG PO TABS: ORAL_TABLET | ORAL | Status: DC

## 2015-07-05 NOTE — Telephone Encounter (Signed)
°  STAT if patient is at the pharmacy , call can be transferred to refill team.   1. Which medications need to be refilled? Coumadin   2. Which pharmacy/location is medication to be sent to? [facility] [address]  3. Do they need a 30 day or 90 day supply? 30 day

## 2015-07-07 ENCOUNTER — Ambulatory Visit (INDEPENDENT_AMBULATORY_CARE_PROVIDER_SITE_OTHER): Payer: Medicare HMO

## 2015-07-07 DIAGNOSIS — I481 Persistent atrial fibrillation: Secondary | ICD-10-CM

## 2015-07-07 DIAGNOSIS — Z7901 Long term (current) use of anticoagulants: Secondary | ICD-10-CM

## 2015-07-07 DIAGNOSIS — I4819 Other persistent atrial fibrillation: Secondary | ICD-10-CM

## 2015-07-07 LAB — POCT INR: INR: 2.4

## 2015-07-14 ENCOUNTER — Ambulatory Visit (INDEPENDENT_AMBULATORY_CARE_PROVIDER_SITE_OTHER): Payer: Medicare HMO

## 2015-07-14 DIAGNOSIS — I4819 Other persistent atrial fibrillation: Secondary | ICD-10-CM

## 2015-07-14 DIAGNOSIS — Z7901 Long term (current) use of anticoagulants: Secondary | ICD-10-CM

## 2015-07-14 DIAGNOSIS — I481 Persistent atrial fibrillation: Secondary | ICD-10-CM

## 2015-07-14 LAB — POCT INR: INR: 2.4

## 2015-07-20 ENCOUNTER — Encounter: Payer: Self-pay | Admitting: Physician Assistant

## 2015-07-20 ENCOUNTER — Ambulatory Visit (INDEPENDENT_AMBULATORY_CARE_PROVIDER_SITE_OTHER): Payer: Medicare HMO | Admitting: Physician Assistant

## 2015-07-20 VITALS — BP 169/79 | HR 55 | Ht 68.0 in | Wt 186.0 lb

## 2015-07-20 DIAGNOSIS — I5041 Acute combined systolic (congestive) and diastolic (congestive) heart failure: Secondary | ICD-10-CM

## 2015-07-20 DIAGNOSIS — I4891 Unspecified atrial fibrillation: Secondary | ICD-10-CM | POA: Diagnosis not present

## 2015-07-20 DIAGNOSIS — N184 Chronic kidney disease, stage 4 (severe): Secondary | ICD-10-CM

## 2015-07-20 DIAGNOSIS — I2510 Atherosclerotic heart disease of native coronary artery without angina pectoris: Secondary | ICD-10-CM | POA: Diagnosis not present

## 2015-07-20 DIAGNOSIS — I5032 Chronic diastolic (congestive) heart failure: Secondary | ICD-10-CM

## 2015-07-20 DIAGNOSIS — I481 Persistent atrial fibrillation: Secondary | ICD-10-CM

## 2015-07-20 DIAGNOSIS — R001 Bradycardia, unspecified: Secondary | ICD-10-CM

## 2015-07-20 DIAGNOSIS — I4819 Other persistent atrial fibrillation: Secondary | ICD-10-CM

## 2015-07-20 DIAGNOSIS — C9110 Chronic lymphocytic leukemia of B-cell type not having achieved remission: Secondary | ICD-10-CM

## 2015-07-20 DIAGNOSIS — I10 Essential (primary) hypertension: Secondary | ICD-10-CM

## 2015-07-20 MED ORDER — AMLODIPINE BESYLATE 10 MG PO TABS: 10.0000 mg | ORAL_TABLET | Freq: Every day | ORAL | Status: DC | PRN

## 2015-07-20 NOTE — Progress Notes (Signed)
Cardiology Hospital Follow Up Note:   Date of Encounter: 07/20/2015  ID: Caroline [facility], DOB 1946-07-10, MRN [id number]  PCP: Caroline Buff, MD Primary Cardiologist: Dr. Rockey Situ, MD  Chief Complaint  Patient presents with  . other    1 month f/u c/o afib and sob. Meds reviewed verbally with pt.     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 from 10/1 to 10/3 for angina pectoris.   She has previously been followed by [facility]. She was first found to have CAD after cardiac arrest in 2004. She was taken for three-vessel CABG at that time (LIMA-LAD, SVG-OM, SVG-RCA). She subsequently underwent ICD placement in 2005 for ischemic cardiomyopathy. Last outpatient echo in 12/2014 showed an EF of 45-50%. Prior to the above admission she had not had any interventions since the above cardiac bypass. She was admitted to [facility] for diarrhea illness and subsequently needed to have a colonoscopy. While she was undergoing an anesthesia work up in the spring of 2016 she was told she had atrial fibrillation. This was a new diagnosis for her at that time and she was not on anticoagulation then. She was cleared for anticoagulation by [facility] oncology on 04/22/2015. Should she need to start treatment for her CLL, this would need to be stopped per [facility] oncology. Unfortunately, she had not had follow up with [facility] since her July oncology visit to start her warfarin.   She presented to [facility] on 10/1 with increased lower extremity swelling, mild cough, and bloating. She had gone up on her Lasix to 40 mg bid, usual dose was 40 mg daily. There was also some sternal chest pain raditing to the left shoulder on the morning of  10/2 that resolved without intervention. ECG showed Afib with rates in the high 40's and intermittently paced beats. CXR with mild fluid overload without edema. Troponin negative x 3, BNP 456, SCr 2.48-->2.38, WBC 26 (this is her baseline given her CLL), LDL 40. Lexiscan Myoview showed no significant ischemia, old scar in themid to distal anteroseptal and septal region, wall motion abnormality in the mid to distal anteroseptal and septal region, EF 48%, no ECG changes concerning for ischemia, low risk scan. She was started on Imdur 30 mg daily. Warfarin was also started given her CHADSVASc of 6.   Since her discharge she feels much better. Weight was 193 at the time of admission, currently she weighs 186. She does not have a scale at home right now as her scale is broken. She feels less "swollen." No further chest pain. She typically takes Lasix 40 mg in the morning and only a 2nd dose if she is SOB or feels "swollen." She has not needed to take an extra dose in the PM since her discharge. She continues to note palpitations and is more SOB with her Afib. INR has been therapeutic since 10/26.      Past Medical History  Diagnosis Date  . Chronic combined systolic and diastolic CHF (congestive heart failure) ([place])   . Diabetes mellitus without complication ([place])   . Hypertension   . Amputation of right lower extremity below knee ([place])   . Amputation of left lower extremity below knee ([place])   .  Persistent atrial fibrillation ([facility])     a. on warfarin  . GERD (gastroesophageal reflux disease)   . Hyperlipidemia   . CLL (chronic lymphocytic leukemia) (H[facility])     leukemia  . Renal failure   . CAD (coronary artery disease)     a. s/p 3 vessel CABG in 2004; b. lexiscan 06/2015 with no signidicant ischemia, EF 48%, low risk  . Ischemic cardiomyopathy     a. s/p ICD; b. echo 12/2014 EF 45-50%  . History of cardiac arrest   . Hiatal hernia   . CKD (chronic kidney disease), stage IV ([place])   . PAD  (peripheral artery disease) ([place])     a. s/p BKA  : Past Surgical History  Procedure Laterality Date  . Abdominal hysterectomy    . Insert / replace / remove pacemaker    . Amputation lower extremity bilaterally Bilateral   . Coronary artery bypass graft    . Cardiac catheterization    : Family History  Problem Relation Age of Onset  . CAD Mother   . Diabetes Mother   . Atrial fibrillation Mother   . Lung cancer Father   . Diabetes Father   :  reports that she has never smoked. She does not have any smokeless tobacco history on file. She reports that she does not drink alcohol or use illicit drugs.:   Allergies:  Allergies  Allergen Reactions  . Zosyn [Piperacillin Sod-Tazobactam So] Other (See Comments)    Renal failure     Home Medications:  Current Outpatient Prescriptions  Medication Sig Dispense Refill  . amitriptyline (ELAVIL) 25 MG tablet Take 25 mg by mouth every evening.  11  . amLODipine (NORVASC) 10 MG tablet Take 1 tablet (10 mg total) by mouth daily as needed. 90 tablet 3  . atorvastatin (LIPITOR) 40 MG tablet Take 40 mg by mouth daily.  11  . carvedilol (COREG) 25 MG tablet Take 25 mg by mouth 2 (two) times daily.  11  . Cholecalciferol (HM VITAMIN D3) 4000 UNITS CAPS Take 4,000 Units by mouth daily.    . furosemide (LASIX) 40 MG tablet Take 1 tablet (40 mg total) by mouth 2 (two) times daily. (Patient taking differently: Take 40 mg by mouth 2 (two) times daily as needed. ) 60 tablet 11  . HUMALOG 100 UNIT/ML injection Inject 10-14 Units into the skin See admin instructions. Inject 12 units subcutaneous in the morning with breakfast, 10 units subcutaneous at lunch, and 14 units subcutaneous at suppertime.  6  . isosorbide mononitrate (IMDUR) 30 MG 24 hr tablet Take 1 tablet (30 mg total) by mouth 2 (two) times daily. 60 tablet 11  . LANTUS 100 UNIT/ML injection Inject 14-35 Units into the skin See admin instructions. Inject 35 units subcutaneous once a day in  the morning. Inject 14 units subcutaneous once a day at bedtime.  0  . losartan (COZAAR) 100 MG tablet Take 100 mg by mouth daily.  11  . Multiple Vitamins-Minerals (OCUTABS-LUTEIN) TABS Take 1 tablet by mouth daily.    . nitroGLYCERIN (NITROSTAT) 0.4 MG SL tablet Place 1 tablet (0.4 mg total) under the tongue every 5 (five) minutes as needed for chest pain. 30 tablet 0  . pantoprazole (PROTONIX) 40 MG tablet Take 40 mg by mouth daily.  0  . POLY-IRON 150 150 MG capsule Take 150 mg by mouth daily.  3  . PREVIDENT 5000 DRY MOUTH 1.1 % GEL dental gel Take by mouth daily. Brush teeth  with paste for at least 1 minute once a day.  5  . sertraline (ZOLOFT) 100 MG tablet Take 100 mg by mouth daily.  11  . vitamin C (ASCORBIC ACID) 500 MG tablet Take 500 mg by mouth daily.    Marland Kitchen warfarin (COUMADIN) 4 MG tablet Take as directed by [facility] 50 tablet 1   No current facility-administered medications for this visit.     Review of Systems:  Review of Systems  Respiratory: Positive for shortness of breath.      Physical Exam:  Blood pressure 169/79, pulse 55, height 5\' 8"  (1.727 m), weight 186 lb (84.369 kg). BMI: Body mass index is 28.29 kg/(m^2). General: Pleasant, NAD. Psych: Normal affect. Responds to questions with normal affect.  Neuro: Alert and oriented X 3. Moves all extremities spontaneously. HEENT: Normocephalic, atraumatic. EOM intact bilaterally. Sclera anicteric.  Neck: Trachea midline. Supple without bruits or JVD. Lungs:  Respirations regular and unlabored, CTA bilaterally without wheezing, crackles, or rhonchi.  Heart: Irregularly-irregular, normal s3, s4. No murmurs, rubs, or gallops.  Abdomen: Soft, non-tender, non-distended, BS + x 4.  Extremities: Bilateral lower extremity prosthesis in place. No edema along bilateral thighs.    Accessory Clinical Findings:  EKG: Afib with slow ventricular response, 55 bpm, left axis deviation, lateral TWI  Recent  Labs: 06/12/2015: B Natriuretic Peptide 456.0* 06/13/2015: BUN 47*; Creatinine, Ser 2.38*; Hemoglobin 10.9*; Platelets 151; Potassium 4.1; Sodium 134*     Component Value Date/Time   CHOL 91 06/13/2015 0122   TRIG 128 06/13/2015 0122   HDL 25* 06/13/2015 0122   CHOLHDL 3.6 06/13/2015 0122   VLDL 26 06/13/2015 0122   LDLCALC 40 06/13/2015 0122    Weights: Wt Readings from Last 3 Encounters:  07/20/15 186 lb (84.369 kg)  06/18/15 243 lb (110.224 kg)  06/12/15 246 lb 9.6 oz (111.857 kg)    CrCl cannot be calculated (Patient has no serum creatinine result on file.).   Other studies Reviewed: Additional studies/ records that were reviewed today include: [facility] and [facility] notes.  Assessment & Plan:  1. CAD s/p 3 vessel CABG as above:  -No anginal symptoms -Recent nonischemic Lexiscan Myoview 06/2015 -Has felt much better since being diuresed -Continue Imdur 30 mg daily and SL NTG prn  -On warfarin in place of aspirin, no indication for dual therapy at this time given no current ACS   2. Persistent Afib:  -Currently rate controlled with a bradycardic heart rate of 55 bpm, asymptomatic  -She would likely do better in sinus rhythm, if she is able to be converted back to sinus rhythm, as she may have been in Afib since April 2016 -She was just recently started on full dose anticoagulation during this last hospitalization, and just reached a therapeutic INR on 10/26. She will need to have a therapeutic INR for a minimal of 3 weeks uninterrupted prior to undergoing any intervention for restoration of possible sinus rhythm -Given her underlying BiV-ICD she may not be a candidate for DCCV and chemical cardioversion may be her only option -Could pursue a trial of amiodarone if ok with [facility] oncology given her underlying CLL, as she is not currently on any therapy, but should she require treatment given the prolonged half-life of amiodarone this may be an issue. Because of this I have asked to  revisit this with her at the time of her next INR draw. At that time (11/16) if she is therapeutic she will have had a therapeutic INR for 3 weeks  uninterrupted and a formal decision can be made -Continue Coreg 25 mg bid -On warfarin  -CHADSVASc at least 6 (CHF, HTN, age x 1, vascular disease, female) giving her an estimated annual stroke risk of 9.8%   3. Chronic diastolic CHF:  -She does not appear to be volume overloaded on exam today -Check BNP -Continue Lasix 40 mg daily with an extra 40 mg prn SOB/weight gain -Continue Coreg 25 mg bid and losartan 100 mg daily -Contacted CHF Clinic to provide patient a new scale as her scale has broken    4. CKD stage IV:  -Check bmet   5. CLL  -Followed by [facility] oncology -Stable   6. History of bradycardia:  -Asymptomatic  -Status post BiV-ICD -Continue Coreg as above, if needed could decrease to 12.5 mg bid  7. PAD:  -Status post bilateral BKA  8. Status post BiV-ICD in 2005 with MDT generator change 12/05/2010:  -Schedule EP follow up with Dr. Caryl Comes, MD to establish care here  9. HTN: -She has self restarted amlodipine 5 mg daily over the past couple of days as her home BP has been in the [id number] systolic -Increase amlodipine to 10 mg daily -Continue Coreg, losartan, and Imdur as above   Dispo: -Follow up via phone or office in 2 weeks to determine best method for possible cardioversion once she has been adequately anticoagulated   Current medicines are reviewed at length with the patient today.  The patient did not have any concerns regarding medicines.   Christell Faith, PA-C [address] [place], [place] [id number] [phone] [place] 07/20/2015, 5:07 PM

## 2015-07-20 NOTE — Patient Instructions (Addendum)
Medication Instructions:  Please increase your amlodipine to 10 mg once daily  Labwork: BMET BMP  Testing/Procedures:  Your physician has requested that you have an echocardiogram. Echocardiography is a painless test that uses sound waves to create images of your heart. It provides your doctor with information about the size and shape of your heart and how well your heart's chambers and valves are working. This procedure takes approximately one hour. There are no restrictions for this procedure.  Date & Time:  __________________  Follow-Up: 2-3 weeks  Any Other Special Instructions Will Be Listed Below (If Applicable).  Please weigh yourself daily The Heart Failure is currently out of scales [facility] has a set of scales for $9.88 Please call the office if you have wt gain of 1-3 lbs overnight or >5lbs in 1 week   If you need a refill on your cardiac medications before your next appointment, please call your pharmacy.  Echocardiogram An echocardiogram, or echocardiography, uses sound waves (ultrasound) to produce an image of your heart. The echocardiogram is simple, painless, obtained within a short period of time, and offers valuable information to your health care provider. The images from an echocardiogram can provide information such as:  Evidence of coronary artery disease (CAD).  Heart size.  Heart muscle function.  Heart valve function.  Aneurysm detection.  Evidence of a past heart attack.  Fluid buildup around the heart.  Heart muscle thickening.  Assess heart valve function. LET [facility] CARE PROVIDER KNOW ABOUT:  Any allergies you have.  All medicines you are taking, including vitamins, herbs, eye drops, creams, and over-the-counter medicines.  Previous problems you or members of your family have had with the use of anesthetics.  Any blood disorders you have.  Previous surgeries you have had.  Medical conditions you have.  Possibility of  pregnancy, if this applies. BEFORE THE PROCEDURE  No special preparation is needed. Eat and drink normally.  PROCEDURE   In order to produce an image of your heart, gel will be applied to your chest and a wand-like tool (transducer) will be moved over your chest. The gel will help transmit the sound waves from the transducer. The sound waves will harmlessly bounce off your heart to allow the heart images to be captured in real-time motion. These images will then be recorded.  You may need an IV to receive a medicine that improves the quality of the pictures. AFTER THE PROCEDURE You may return to your normal schedule including diet, activities, and medicines, unless your health care provider tells you otherwise.   This information is not intended to replace advice given to you by your health care provider. Make sure you discuss any questions you have with your health care provider.   Document Released: 08/25/2000 Document Revised: 09/18/2014 Document Reviewed: 05/05/2013 Elsevier Interactive Patient Education 2016 [employer] Heart Failure Heart failure is a condition in which the heart has trouble pumping blood. This means your heart does not pump blood efficiently for your body to work well. In some cases of heart failure, fluid may back up into your lungs or you may have swelling (edema) in your lower legs. Heart failure is usually a long-term (chronic) condition. It is important for you to take good care of yourself and follow your health care provider's treatment plan. CAUSES  Some health conditions can cause heart failure. Those health conditions include:  High blood pressure (hypertension). Hypertension causes the heart muscle to work harder than normal. When pressure in the  blood vessels is high, the heart needs to pump (contract) with more force in order to circulate blood throughout the body. High blood pressure eventually causes the heart to become stiff and weak.  Coronary artery  disease (CAD). CAD is the buildup of cholesterol and fat (plaque) in the arteries of the heart. The blockage in the arteries deprives the heart muscle of oxygen and blood. This can cause chest pain and may lead to a heart attack. High blood pressure can also contribute to CAD.  Heart attack (myocardial infarction). A heart attack occurs when one or more arteries in the heart become blocked. The loss of oxygen damages the muscle tissue of the heart. When this happens, part of the heart muscle dies. The injured tissue does not contract as well and weakens the heart's ability to pump blood.  Abnormal heart valves. When the heart valves do not open and close properly, it can cause heart failure. This makes the heart muscle pump harder to keep the blood flowing.  Heart muscle disease (cardiomyopathy or myocarditis). Heart muscle disease is damage to the heart muscle from a variety of causes. These can include drug or alcohol abuse, infections, or unknown reasons. These can increase the risk of heart failure.  Lung disease. Lung disease makes the heart work harder because the lungs do not work properly. This can cause a strain on the heart, leading it to fail.  Diabetes. Diabetes increases the risk of heart failure. High blood sugar contributes to high fat (lipid) levels in the blood. Diabetes can also cause slow damage to tiny blood vessels that carry important nutrients to the heart muscle. When the heart does not get enough oxygen and food, it can cause the heart to become weak and stiff. This leads to a heart that does not contract efficiently.  Other conditions can contribute to heart failure. These include abnormal heart rhythms, thyroid problems, and low blood counts (anemia). Certain unhealthy behaviors can increase the risk of heart failure, including:  Being overweight.  Smoking or chewing tobacco.  Eating foods high in fat and cholesterol.  Abusing illicit drugs or alcohol.  Lacking  physical activity. SYMPTOMS  Heart failure symptoms may vary and can be hard to detect. Symptoms may include:  Shortness of breath with activity, such as climbing stairs.  Persistent cough.  Swelling of the feet, ankles, legs, or abdomen.  Unexplained weight gain.  Difficulty breathing when lying flat (orthopnea).  Waking from sleep because of the need to sit up and get more air.  Rapid heartbeat.  Fatigue and loss of energy.  Feeling light-headed, dizzy, or close to fainting.  Loss of appetite.  Nausea.  Increased urination during the night (nocturia). DIAGNOSIS  A diagnosis of heart failure is based on your history, symptoms, physical examination, and diagnostic tests. Diagnostic tests for heart failure may include:  Echocardiography.  Electrocardiography.  Chest X-ray.  Blood tests.  Exercise stress test.  Cardiac angiography.  Radionuclide scans. TREATMENT  Treatment is aimed at managing the symptoms of heart failure. Medicines, behavioral changes, or surgical intervention may be necessary to treat heart failure.  Medicines to help treat heart failure may include:  Angiotensin-converting enzyme (ACE) inhibitors. This type of medicine blocks the effects of a blood protein called angiotensin-converting enzyme. ACE inhibitors relax (dilate) the blood vessels and help lower blood pressure.  Angiotensin receptor blockers (ARBs). This type of medicine blocks the actions of a blood protein called angiotensin. Angiotensin receptor blockers dilate the blood vessels and help  lower blood pressure.  Water pills (diuretics). Diuretics cause the kidneys to remove salt and water from the blood. The extra fluid is removed through urination. This loss of extra fluid lowers the volume of blood the heart pumps.  Beta blockers. These prevent the heart from beating too fast and improve heart muscle strength.  Digitalis. This increases the force of the heartbeat.  Healthy  behavior changes include:  Obtaining and maintaining a healthy weight.  Stopping smoking or chewing tobacco.  Eating heart-healthy foods.  Limiting or avoiding alcohol.  Stopping illicit drug use.  Physical activity as directed by your health care provider.  Surgical treatment for heart failure may include:  A procedure to open blocked arteries, repair damaged heart valves, or remove damaged heart muscle tissue.  A pacemaker to improve heart muscle function and control certain abnormal heart rhythms.  An internal cardioverter defibrillator to treat certain serious abnormal heart rhythms.  A left ventricular assist device (LVAD) to assist the pumping ability of the heart. HOME CARE INSTRUCTIONS   Take medicines only as directed by your health care provider. Medicines are important in reducing the workload of your heart, slowing the progression of heart failure, and improving your symptoms.  Do not stop taking your medicine unless directed by your health care provider.  Do not skip any dose of medicine.  Refill your prescriptions before you run out of medicine. Your medicines are needed every day.  Engage in moderate physical activity if directed by your health care provider. Moderate physical activity can benefit some people. The elderly and people with severe heart failure should consult with a health care provider for physical activity recommendations.  Eat heart-healthy foods. Food choices should be free of trans fat and low in saturated fat, cholesterol, and salt (sodium). Healthy choices include fresh or frozen fruits and vegetables, fish, lean meats, legumes, fat-free or low-fat dairy products, and whole grain or high fiber foods. Talk to a dietitian to learn more about heart-healthy foods.  Limit sodium if directed by your health care provider. Sodium restriction may reduce symptoms of heart failure in some people. Talk to a dietitian to learn more about heart-healthy  seasonings.  Use healthy cooking methods. Healthy cooking methods include roasting, grilling, broiling, baking, poaching, steaming, or stir-frying. Talk to a dietitian to learn more about healthy cooking methods.  Limit fluids if directed by your health care provider. Fluid restriction may reduce symptoms of heart failure in some people.  Weigh yourself every day. Daily weights are important in the early recognition of excess fluid. You should weigh yourself every morning after you urinate and before you eat breakfast. Wear the same amount of clothing each time you weigh yourself. Record your daily weight. Provide your health care provider with your weight record.  Monitor and record your blood pressure if directed by your health care provider.  Check your pulse if directed by your health care provider.  Lose weight if directed by your health care provider. Weight loss may reduce symptoms of heart failure in some people.  Stop smoking or chewing tobacco. Nicotine makes your heart work harder by causing your blood vessels to constrict. Do not use nicotine gum or patches before talking to your health care provider.  Keep all follow-up visits as directed by your health care provider. This is important.  Limit alcohol intake to no more than 1 drink per day for nonpregnant women and 2 drinks per day for men. One drink equals 12 ounces of beer, 5  ounces of wine, or 1 ounces of hard liquor. Drinking more than that is harmful to your heart. Tell your health care provider if you drink alcohol several times a week. Talk with your health care provider about whether alcohol is safe for you. If your heart has already been damaged by alcohol or you have severe heart failure, drinking alcohol should be stopped completely.  Stop illicit drug use.  Stay up-to-date with immunizations. It is especially important to prevent respiratory infections through current pneumococcal and influenza immunizations.  Manage  other health conditions such as hypertension, diabetes, thyroid disease, or abnormal heart rhythms as directed by your health care provider.  Learn to manage stress.  Plan rest periods when fatigued.  Learn strategies to manage high temperatures. If the weather is extremely hot:  Avoid vigorous physical activity.  Use air conditioning or fans or seek a cooler location.  Avoid caffeine and alcohol.  Wear loose-fitting, lightweight, and light-colored clothing.  Learn strategies to manage cold temperatures. If the weather is extremely cold:  Avoid vigorous physical activity.  Layer clothes.  Wear mittens or gloves, a hat, and a scarf when going outside.  Avoid alcohol.  Obtain ongoing education and support as needed.  Participate in or seek rehabilitation as needed to maintain or improve independence and quality of life. SEEK MEDICAL CARE IF:   You have a rapid weight gain.  You have increasing shortness of breath that is unusual for you.  You are unable to participate in your usual physical activities.  You tire easily.  You cough more than normal, especially with physical activity.  You have any or more swelling in areas such as your hands, feet, ankles, or abdomen.  You are unable to sleep because it is hard to breathe.  You feel like your heart is beating fast (palpitations).  You become dizzy or light-headed upon standing up. SEEK IMMEDIATE MEDICAL CARE IF:   You have difficulty breathing.  There is a change in mental status such as decreased alertness or difficulty with concentration.  You have a pain or discomfort in your chest.  You have an episode of fainting (syncope). MAKE SURE YOU:   Understand these instructions.  Will watch your condition.  Will get help right away if you are not doing well or get worse.   This information is not intended to replace advice given to you by your health care provider. Make sure you discuss any questions you  have with your health care provider.   Document Released: 08/28/2005 Document Revised: 01/12/2015 Document Reviewed: 09/27/2012 Elsevier Interactive Patient Education 2016 Elsevier Inc. Atrial Fibrillation Atrial fibrillation is a type of irregular or rapid heartbeat (arrhythmia). In atrial fibrillation, the heart quivers continuously in a chaotic pattern. This occurs when parts of the heart receive disorganized signals that make the heart unable to pump blood normally. This can increase the risk for stroke, heart failure, and other heart-related conditions. There are different types of atrial fibrillation, including:  Paroxysmal atrial fibrillation. This type starts suddenly, and it usually stops on its own shortly after it starts.  Persistent atrial fibrillation. This type often lasts longer than a week. It may stop on its own or with treatment.  Long-lasting persistent atrial fibrillation. This type lasts longer than 12 months.  Permanent atrial fibrillation. This type does not go away. Talk with your health care provider to learn about the type of atrial fibrillation that you have. CAUSES This condition is caused by some heart-related conditions or procedures,  including:  A heart attack.  Coronary artery disease.  Heart failure.  Heart valve conditions.  High blood pressure.  Inflammation of the sac that surrounds the heart (pericarditis).  Heart surgery.  Certain heart rhythm disorders, such as Wolf-Parkinson-Gawthrop syndrome. Other causes include:  Pneumonia.  Obstructive sleep apnea.  Blockage of an artery in the lungs (pulmonary embolism, or PE).  Lung cancer.  Chronic lung disease.  Thyroid problems, especially if the thyroid is overactive (hyperthyroidism).  Caffeine.  Excessive alcohol use or illegal drug use.  Use of some medicines, including certain decongestants and diet pills. Sometimes, the cause cannot be found. RISK FACTORS This condition is more  likely to develop in:  People who are older in age.  People who smoke.  People who have diabetes mellitus.  People who are overweight (obese).  Athletes who exercise vigorously. SYMPTOMS Symptoms of this condition include:  A feeling that your heart is beating rapidly or irregularly.  A feeling of discomfort or pain in your chest.  Shortness of breath.  Sudden light-headedness or weakness.  Getting tired easily during exercise. In some cases, there are no symptoms. DIAGNOSIS Your health care provider may be able to detect atrial fibrillation when taking your pulse. If detected, this condition may be diagnosed with:  An electrocardiogram (ECG).  A Holter monitor test that records your heartbeat patterns over a 24-hour period.  Transthoracic echocardiogram (TTE) to evaluate how blood flows through your heart.  Transesophageal echocardiogram (TEE) to view more detailed images of your heart.  A stress test.  Imaging tests, such as a CT scan or chest X-ray.  Blood tests. TREATMENT The main goals of treatment are to prevent blood clots from forming and to keep your heart beating at a normal rate and rhythm. The type of treatment that you receive depends on many factors, such as your underlying medical conditions and how you feel when you are experiencing atrial fibrillation. This condition may be treated with:  Medicine to slow down the heart rate, bring the heart's rhythm back to normal, or prevent clots from forming.  Electrical cardioversion. This is a procedure that resets your heart's rhythm by delivering a controlled, low-energy shock to the heart through your skin.  Different types of ablation, such as catheter ablation, catheter ablation with pacemaker, or surgical ablation. These procedures destroy the heart tissues that send abnormal signals. When the pacemaker is used, it is placed under your skin to help your heart beat in a regular rhythm. HOME CARE  INSTRUCTIONS  Take over-the counter and prescription medicines only as told by your health care provider.  If your health care provider prescribed a blood-thinning medicine (anticoagulant), take it exactly as told. Taking too much blood-thinning medicine can cause bleeding. If you do not take enough blood-thinning medicine, you will not have the protection that you need against stroke and other problems.  Do not use tobacco products, including cigarettes, chewing tobacco, and e-cigarettes. If you need help quitting, ask your health care provider.  If you have obstructive sleep apnea, manage your condition as told by your health care provider.  Do not drink alcohol.  Do not drink beverages that contain caffeine, such as coffee, soda, and tea.  Maintain a healthy weight. Do not use diet pills unless your health care provider approves. Diet pills may make heart problems worse.  Follow diet instructions as told by your health care provider.  Exercise regularly as told by your health care provider.  Keep all follow-up visits as  told by your health care provider. This is important. PREVENTION  Avoid drinking beverages that contain caffeine or alcohol.  Avoid certain medicines, especially medicines that are used for breathing problems.  Avoid certain herbs and herbal medicines, such as those that contain ephedra or ginseng.  Do not use illegal drugs, such as cocaine and amphetamines.  Do not smoke.  Manage your high blood pressure. SEEK MEDICAL CARE IF:  You notice a change in the rate, rhythm, or strength of your heartbeat.  You are taking an anticoagulant and you notice increased bruising.  You tire more easily when you exercise or exert yourself. SEEK IMMEDIATE MEDICAL CARE IF:  You have chest pain, abdominal pain, sweating, or weakness.  You feel nauseous.  You notice blood in your vomit, bowel movement, or urine.  You have shortness of breath.  You suddenly have  swollen feet and ankles.  You feel dizzy.  You have sudden weakness or numbness of the face, arm, or leg, especially on one side of the body.  You have trouble speaking, trouble understanding, or both (aphasia).  Your face or your eyelid droops on one side. These symptoms may represent a serious problem that is an emergency. Do not wait to see if the symptoms will go away. Get medical help right away. Call your local emergency services (911 in the [place] Do not drive yourself to the hospital.   This information is not intended to replace advice given to you by your health care provider. Make sure you discuss any questions you have with your health care provider.   Document Released: 08/28/2005 Document Revised: 05/19/2015 Document Reviewed: 12/23/2014 Elsevier Interactive Patient Education [employer].

## 2015-07-22 LAB — BASIC METABOLIC PANEL
BUN/Creatinine Ratio: 16 (ref 11–26)
BUN: 26 mg/dL (ref 8–27)
CHLORIDE: 99 mmol/L (ref 97–106)
CO2: 23 mmol/L (ref 18–29)
Calcium: 8.9 mg/dL (ref 8.7–10.3)
Creatinine, Ser: 1.61 mg/dL — ABNORMAL HIGH (ref 0.57–1.00)
GFR calc Af Amer: 38 mL/min/{1.73_m2} — ABNORMAL LOW (ref >59)
GFR, EST NON AFRICAN AMERICAN: 33 mL/min/{1.73_m2} — AB (ref >59)
GLUCOSE: 245 mg/dL — AB (ref 65–99)
POTASSIUM: 4.4 mmol/L (ref 3.5–5.2)
SODIUM: 137 mmol/L (ref 136–144)

## 2015-07-22 LAB — BRAIN NATRIURETIC PEPTIDE: BNP: 333.5 pg/mL — ABNORMAL HIGH (ref 0.0–100.0)

## 2015-07-28 ENCOUNTER — Ambulatory Visit (INDEPENDENT_AMBULATORY_CARE_PROVIDER_SITE_OTHER): Payer: Medicare HMO

## 2015-07-28 DIAGNOSIS — Z7901 Long term (current) use of anticoagulants: Secondary | ICD-10-CM

## 2015-07-28 DIAGNOSIS — I481 Persistent atrial fibrillation: Secondary | ICD-10-CM

## 2015-07-28 DIAGNOSIS — I4819 Other persistent atrial fibrillation: Secondary | ICD-10-CM

## 2015-07-28 LAB — POCT INR: INR: 3.1

## 2015-08-12 ENCOUNTER — Other Ambulatory Visit: Payer: Self-pay

## 2015-08-12 ENCOUNTER — Ambulatory Visit (INDEPENDENT_AMBULATORY_CARE_PROVIDER_SITE_OTHER): Payer: Medicare HMO

## 2015-08-12 DIAGNOSIS — I5041 Acute combined systolic (congestive) and diastolic (congestive) heart failure: Secondary | ICD-10-CM | POA: Diagnosis not present

## 2015-08-12 DIAGNOSIS — I4891 Unspecified atrial fibrillation: Secondary | ICD-10-CM | POA: Diagnosis not present

## 2015-08-12 DIAGNOSIS — I481 Persistent atrial fibrillation: Secondary | ICD-10-CM

## 2015-08-12 DIAGNOSIS — I4819 Other persistent atrial fibrillation: Secondary | ICD-10-CM

## 2015-08-19 ENCOUNTER — Other Ambulatory Visit: Payer: Self-pay

## 2015-08-19 DIAGNOSIS — I159 Secondary hypertension, unspecified: Secondary | ICD-10-CM

## 2015-08-19 MED ORDER — POTASSIUM CHLORIDE ER 20 MEQ PO TBCR: 20.0000 meq | EXTENDED_RELEASE_TABLET | Freq: Two times a day (BID) | ORAL | Status: DC

## 2015-08-20 ENCOUNTER — Encounter: Payer: Self-pay | Admitting: Nurse Practitioner

## 2015-08-20 ENCOUNTER — Telehealth: Payer: Self-pay

## 2015-08-20 ENCOUNTER — Ambulatory Visit (INDEPENDENT_AMBULATORY_CARE_PROVIDER_SITE_OTHER): Payer: Medicare HMO | Admitting: Nurse Practitioner

## 2015-08-20 ENCOUNTER — Other Ambulatory Visit: Payer: Self-pay

## 2015-08-20 ENCOUNTER — Ambulatory Visit (INDEPENDENT_AMBULATORY_CARE_PROVIDER_SITE_OTHER): Payer: Medicare HMO

## 2015-08-20 VITALS — BP 140/82 | HR 59 | Ht 68.0 in | Wt 185.4 lb

## 2015-08-20 DIAGNOSIS — I4819 Other persistent atrial fibrillation: Secondary | ICD-10-CM

## 2015-08-20 DIAGNOSIS — I272 Other secondary pulmonary hypertension: Secondary | ICD-10-CM

## 2015-08-20 DIAGNOSIS — I10 Essential (primary) hypertension: Secondary | ICD-10-CM | POA: Diagnosis not present

## 2015-08-20 DIAGNOSIS — I5023 Acute on chronic systolic (congestive) heart failure: Secondary | ICD-10-CM | POA: Insufficient documentation

## 2015-08-20 DIAGNOSIS — I481 Persistent atrial fibrillation: Secondary | ICD-10-CM

## 2015-08-20 DIAGNOSIS — N186 End stage renal disease: Secondary | ICD-10-CM | POA: Insufficient documentation

## 2015-08-20 DIAGNOSIS — I5042 Chronic combined systolic (congestive) and diastolic (congestive) heart failure: Secondary | ICD-10-CM | POA: Insufficient documentation

## 2015-08-20 DIAGNOSIS — Z7901 Long term (current) use of anticoagulants: Secondary | ICD-10-CM | POA: Diagnosis not present

## 2015-08-20 DIAGNOSIS — I255 Ischemic cardiomyopathy: Secondary | ICD-10-CM | POA: Insufficient documentation

## 2015-08-20 DIAGNOSIS — N184 Chronic kidney disease, stage 4 (severe): Secondary | ICD-10-CM | POA: Insufficient documentation

## 2015-08-20 LAB — POCT INR: INR: 2.8

## 2015-08-20 NOTE — Telephone Encounter (Signed)
Left message for pt that since Ignacia Bayley did not want her to increase lasix, potassium that was added is not needed. Provided CB number to confirm.  S/w Baxter Flattery at pharmacy who states pt has not picked up potassium prescription. She will discontinue it.

## 2015-08-20 NOTE — Patient Instructions (Addendum)
Medication Instructions:  Your physician recommends that you continue on your current medications as directed. Please refer to the Current Medication list given to you today. Take lasix 40mg  once per day   Labwork: none  Testing/Procedures: none  Follow-Up: Your physician recommends that you schedule a follow-up appointment around January 18 or 19 with Christell Faith, PA-C or Ignacia Bayley, NP   Any Other Special Instructions Will Be Listed Below (If Applicable). We will call you regarding Device [facility] appointment [facility] appointment 12/28     If you need a refill on your cardiac medications before your next appointment, please call your pharmacy.

## 2015-08-20 NOTE — Progress Notes (Signed)
Patient Name: Caroline Hernandez Date of Encounter: 08/20/2015  Primary Care Provider:  Art Buff, MD Primary Cardiologist:  Johnny Bridge, MD   Chief Complaint  69 y/o ?with a h/o CAD, CHF, and AF on coumadin who presents for f/u.  Past Medical History   Past Medical History  Diagnosis Date  . Chronic combined systolic and diastolic CHF (congestive heart failure) ([place])     a. 12/2014 Echo: EF 45-50%;  b. 08/2015 Echo: EF 45-50%, ant, antsept HK, mildly dil LA, nl RV, mild-mod TR, sev PAH (45mmHg).  . Diabetes mellitus without complication ([place])   . Essential hypertension   . Amputation of right lower extremity below knee ([place])   . Amputation of left lower extremity below knee ([place])   . Persistent atrial fibrillation ([place])     a. Dx 12/2014.  CHA2DS2VASc = 6--> warfarin  . GERD (gastroesophageal reflux disease)   . Hyperlipidemia   . CLL (chronic lymphocytic leukemia) ([place])   . Renal failure   . CAD (coronary artery disease)     a. 2004 Cardiac Arrest/CABG x 3 (LIMA->LAD, VG->OM, VG->RCA);  b. 06/2015 lexiscan MV: no signidicant ischemia, EF 48%, low risk->Med Rx.  . Ischemic cardiomyopathy     a. s/p ICD; b. 12/2014 Echo: EF 45-50%;  c.08/2015 Echo: EF 45-50%, ant, antsept HK. Nl RV.  Marland Kitchen History of cardiac arrest     a. 2004.  Marland Kitchen Hiatal hernia   . CKD (chronic kidney disease), stage IV ([place])   . PAD (peripheral artery disease) ([place])     a. s/p BKA   Past Surgical History  Procedure Laterality Date  . Abdominal hysterectomy    . Insert / replace / remove pacemaker    . Amputation lower extremity bilaterally Bilateral   . Coronary artery bypass graft    . Cardiac catheterization      Allergies  Allergies  Allergen Reactions  . Zosyn [Piperacillin Sod-Tazobactam So] Other (See Comments)    Renal failure    HPI  69 y/o ?with the above complex past medical history. She is status post cardiac arrest and coronary artery bypass grafting 3 in 2004, gated by  ischemic retinopathy and systolic heart failure requiring ICD placement in 2005. In the spring of 2016, she was diagnosed with atrial fibrillation, which has been persistent. No attempt is previously been made at restoration of sinus rhythm. She was placed on warfarin anticoagulation earlier this fall and is followed in our Coumadin clinic. In October of this year, she was admitted to [facility] with complaints of chest pain. Stress testing was negative. She was last seen in clinic in November and was doing reasonably well. There was discussion regarding possible cardioversion and her INRs have been therapeutic since then. A 2-D echocardiogram was performed on December 1 revealing an EF of 45-50% with severe pulmonary hypertension. She says that since her last visit, she's been doing exceptionally well. She denies chest pain, dyspnea, PND, orthopnea, dizziness, significant edema, or early satiety. Her weight has been stable at roughly 185 pounds on Lasix 40 mg daily. Since resuming amlodipine at her last visit, her blood pressures in the 130s at home. She remains interested in elective cardioversion however notes that she has a breast biopsy scheduled for December 19 and she will need to come off of Coumadin for 5 days prior to that. As such, she is now looking to defer cardioversion until January.  Home Medications  Prior to Admission medications   Medication Sig  Start Date End Date Taking? Authorizing Provider  amitriptyline (ELAVIL) 25 MG tablet Take 25 mg by mouth every evening. 06/02/15  Yes Historical Provider, MD  amLODipine (NORVASC) 10 MG tablet Take 1 tablet (10 mg total) by mouth daily as needed. 07/20/15  Yes Ryan M Dunn, PA-C  atorvastatin (LIPITOR) 40 MG tablet Take 40 mg by mouth daily. 06/02/15  Yes Historical Provider, MD  carvedilol (COREG) 25 MG tablet Take 25 mg by mouth 2 (two) times daily. 05/30/15  Yes Historical Provider, MD  Cholecalciferol (HM VITAMIN D3) 4000 UNITS CAPS Take  4,000 Units by mouth daily.   Yes Historical Provider, MD  furosemide (LASIX) 40 MG tablet Take 1 tablet (40 mg total) by mouth 2 (two) times daily. Patient taking differently: Take 40 mg by mouth 2 (two) times daily as needed.  06/18/15  Yes Minna Merritts, MD  HUMALOG 100 UNIT/ML injection Inject 10-14 Units into the skin See admin instructions. Inject 12 units subcutaneous in the morning with breakfast, 10 units subcutaneous at lunch, and 14 units subcutaneous at suppertime. 05/28/15  Yes Historical Provider, MD  isosorbide mononitrate (IMDUR) 30 MG 24 hr tablet Take 1 tablet (30 mg total) by mouth 2 (two) times daily. 06/18/15  Yes Minna Merritts, MD  LANTUS 100 UNIT/ML injection Inject 14-35 Units into the skin See admin instructions. Inject 35 units subcutaneous once a day in the morning. Inject 14 units subcutaneous once a day at bedtime. 05/31/15  Yes Historical Provider, MD  losartan (COZAAR) 100 MG tablet Take 100 mg by mouth daily. 06/02/15  Yes Historical Provider, MD  Multiple Vitamins-Minerals (OCUTABS-LUTEIN) TABS Take 1 tablet by mouth daily.   Yes Historical Provider, MD  nitroGLYCERIN (NITROSTAT) 0.4 MG SL tablet Place 1 tablet (0.4 mg total) under the tongue every 5 (five) minutes as needed for chest pain. 06/14/15  Yes Sital Mody, MD  pantoprazole (PROTONIX) 40 MG tablet Take 40 mg by mouth daily. 05/24/15  Yes Historical Provider, MD  POLY-IRON 150 150 MG capsule Take 150 mg by mouth daily. 05/24/15  Yes Historical Provider, MD  potassium chloride 20 MEQ TBCR Take 20 mEq by mouth 2 (two) times daily. 08/19/15  Yes Ryan M Dunn, PA-C  PREVIDENT 5000 DRY MOUTH 1.1 % GEL dental gel Take by mouth daily. Brush teeth with paste for at least 1 minute once a day. 03/25/15  Yes Historical Provider, MD  sertraline (ZOLOFT) 100 MG tablet Take 100 mg by mouth daily. 06/02/15  Yes Historical Provider, MD  vitamin C (ASCORBIC ACID) 500 MG tablet Take 500 mg by mouth daily.   Yes Historical Provider, MD    warfarin (COUMADIN) 4 MG tablet Take as directed by Coumadin Clinic 07/05/15  Yes Minna Merritts, MD    Review of Systems  As above doing well.  She denies chest pain, palpitations, dyspnea, pnd, orthopnea, n, v, dizziness, syncope, edema, weight gain, or early satiety.  All other systems reviewed and are otherwise negative except as noted above.  Physical Exam  VS:  BP 140/82 mmHg  Pulse 59  Ht 5\' 8"  (1.727 m)  Wt 185 lb 6 oz (84.086 kg)  BMI 28.19 kg/m2 , BMI Body mass index is 28.19 kg/(m^2). GEN: Well nourished, well developed, in no acute distress. HEENT: normal. Neck: Supple, no JVD, carotid bruits, or masses. Cardiac: IR, IR, 2/6 syst murmur @ llsb, no rubs, or gallops. No clubbing, cyanosis, edema.  bilat bka w/ prostheses.  Radials 2+ and equal bilaterally.  Respiratory:  Respirations regular and unlabored, clear to auscultation bilaterally. GI: Soft, nontender, nondistended, BS + x 4. MS: no deformity or atrophy. Skin: warm and dry, no rash. Neuro:  Strength and sensation are intact. Psych: Normal affect.  Accessory Clinical Findings  ECG - atrial fibrillation, 59, left axis deviation, intraventricular conduction delay. No acute changes.  Assessment & Plan  1.  Persistent atrial fibrillation: Patient was diagnosed with atrial fibrillation in the spring of 2016. She's been well rate controlled on beta blocker therapy. She's been on anticoagulation since early fall of 2016. INRs have been therapeutic since early November. She is asymptomatic with regards to it fibrillation however is interested in cardioversion. She will be coming off of Coumadin next week in preparation for a breast biopsy and as result, we will have to defer cardioversion until late January. She is able to come off Coumadin without bridging for her biopsy and then following resumption of Coumadin, we should plan on weekly INRs for 4 weeks with office follow-up and then subsequent planning of  cardioversion. CHA2DS2VASc equals 6.  2. Chronic combined systolic and diastolic congestive heart failure/ICM: She is euvolemic on exam today. Weight has been stable at 185 pounds at home and she has been doing well from a symptom standpoint. She remains on Lasix 40 mg daily and rarely has to use an extra dose for weight gain or increasing abdominal girth. Recent echo showed EF 45-50%.  3. Essential hypertension: Blood pressure 140/82 today. She says it's been running in the 130 since resumption of amlodipine. No changes today.  4. Pulmonary hypertension: Pulmonary arterial systolic pressure of 66 mmHg on recent echo. Right ventricular systolic pressure from echo in April 2016 was 50. Patient is not having any dyspnea. She also does not have a history of smoking or COPD.  5. Status post ICD: This is still being followed at [facility]. She would like to transfer care to device clinic either here in [place] or in [place]. We will arrange for this.  6. Stage IV chronic kidney disease: Renal function was stable on recent evaluation in November.  7. Disposition: We will arrange for Coumadin clinic follow-up in late December and then weekly INR checks with office follow-up in approximately 45 days to discuss cardioversion.  Murray Hodgkins, NP 08/20/2015, 2:11 PM

## 2015-08-24 ENCOUNTER — Telehealth: Payer: Self-pay

## 2015-08-24 NOTE — Telephone Encounter (Signed)
Pt indicated at 12/9 OV that she would like to establish care for ICD at [facility]. Left message for pt that she will need to establish care with Dr. Caryl Comes. Left call back number and have forwarded to scheduling for appt.

## 2015-08-25 ENCOUNTER — Telehealth: Payer: Self-pay | Admitting: *Deleted

## 2015-08-25 NOTE — Telephone Encounter (Signed)
lmov to schedule appointment with Dr Lequita Halt (This pt currently being followed by [facility] for her device but would like to transfer care here) Per staff message from Ardeth Sportsman

## 2015-09-15 ENCOUNTER — Ambulatory Visit (INDEPENDENT_AMBULATORY_CARE_PROVIDER_SITE_OTHER): Payer: Medicare HMO

## 2015-09-15 DIAGNOSIS — Z7901 Long term (current) use of anticoagulants: Secondary | ICD-10-CM

## 2015-09-15 DIAGNOSIS — I4819 Other persistent atrial fibrillation: Secondary | ICD-10-CM

## 2015-09-15 DIAGNOSIS — I481 Persistent atrial fibrillation: Secondary | ICD-10-CM | POA: Diagnosis not present

## 2015-09-15 LAB — POCT INR: INR: 2

## 2015-09-15 MED ORDER — WARFARIN SODIUM 4 MG PO TABS: ORAL_TABLET | ORAL | Status: DC

## 2015-09-22 ENCOUNTER — Ambulatory Visit (INDEPENDENT_AMBULATORY_CARE_PROVIDER_SITE_OTHER): Payer: Medicare HMO

## 2015-09-22 DIAGNOSIS — I481 Persistent atrial fibrillation: Secondary | ICD-10-CM | POA: Diagnosis not present

## 2015-09-22 DIAGNOSIS — Z7901 Long term (current) use of anticoagulants: Secondary | ICD-10-CM | POA: Diagnosis not present

## 2015-09-22 DIAGNOSIS — I4819 Other persistent atrial fibrillation: Secondary | ICD-10-CM

## 2015-09-22 LAB — POCT INR: INR: 2.2

## 2015-09-30 ENCOUNTER — Encounter: Payer: Self-pay | Admitting: Nurse Practitioner

## 2015-09-30 ENCOUNTER — Ambulatory Visit (INDEPENDENT_AMBULATORY_CARE_PROVIDER_SITE_OTHER): Payer: Medicare HMO | Admitting: Nurse Practitioner

## 2015-09-30 ENCOUNTER — Ambulatory Visit (INDEPENDENT_AMBULATORY_CARE_PROVIDER_SITE_OTHER): Payer: Medicare HMO

## 2015-09-30 VITALS — BP 154/72 | HR 56 | Ht 68.0 in | Wt 183.0 lb

## 2015-09-30 DIAGNOSIS — Z7901 Long term (current) use of anticoagulants: Secondary | ICD-10-CM | POA: Diagnosis not present

## 2015-09-30 DIAGNOSIS — I481 Persistent atrial fibrillation: Secondary | ICD-10-CM

## 2015-09-30 DIAGNOSIS — I110 Hypertensive heart disease with heart failure: Secondary | ICD-10-CM

## 2015-09-30 DIAGNOSIS — I255 Ischemic cardiomyopathy: Secondary | ICD-10-CM

## 2015-09-30 DIAGNOSIS — I4819 Other persistent atrial fibrillation: Secondary | ICD-10-CM

## 2015-09-30 DIAGNOSIS — I25111 Atherosclerotic heart disease of native coronary artery with angina pectoris with documented spasm: Secondary | ICD-10-CM | POA: Diagnosis not present

## 2015-09-30 DIAGNOSIS — I5042 Chronic combined systolic (congestive) and diastolic (congestive) heart failure: Secondary | ICD-10-CM

## 2015-09-30 LAB — POCT INR: INR: 2.2

## 2015-09-30 MED ORDER — AMIODARONE HCL 200 MG PO TABS: 200.0000 mg | ORAL_TABLET | Freq: Two times a day (BID) | ORAL | Status: DC

## 2015-09-30 MED ORDER — CARVEDILOL 25 MG PO TABS: 12.5000 mg | ORAL_TABLET | Freq: Two times a day (BID) | ORAL | Status: DC

## 2015-09-30 NOTE — Patient Instructions (Addendum)
Medication Instructions:  Your physician has recommended you make the following change in your medication:  DECREASE coreg to 1/2 tablet (12.5mg ) twice daily START taking amiodarone 200mg  twice daily   Labwork: BMET, CBC, PT/INR Jan 25 at [facility] Coumadin clinic Jan 25  Testing/Procedures: Your physician has recommended that you have a Cardioversion (DCCV). Electrical Cardioversion uses a jolt of electricity to your heart either through paddles or wired patches attached to your chest. This is a controlled, usually prescheduled, procedure. Defibrillation is done under light anesthesia in the hospital, and you usually go home the day of the procedure. This is done to get your heart back into a normal rhythm. You are not awake for the procedure. Please see the instruction sheet given to you today.  Thursday, January 26. Arrival time: 6:30am [facility] Entrance    DIET INSTRUCTIONS:  Nothing to eat or drink after midnight except your medications with a  sip of water.         1) Labs: ___BMET, CBC, PT/INR Jan 25  2) Medications:  YOU MAY TAKE ALL of your remaining medications with a small amount of water EXCEPT insulin  3) Must have a responsible person to drive you home.  4) Bring a current list of your medications and current insurance cards.    If you have any questions after you get home, please call the office at [phone]    Follow-Up: Your physician recommends that you schedule a follow-up appointment in: three weeks   Any Other Special Instructions Will Be Listed Below (If Applicable).     If you need a refill on your cardiac medications before your next appointment, please call your pharmacy.  Electrical Cardioversion, Care After Refer to this sheet in the next few weeks. These instructions provide you with information on caring for yourself after your procedure. Your health care provider may also give you more specific  instructions. Your treatment has been planned according to current medical practices, but problems sometimes occur. Call your health care provider if you have any problems or questions after your procedure. WHAT TO EXPECT AFTER THE PROCEDURE After your procedure, it is typical to have the following sensations:  Some redness on the skin where the shocks were delivered. If this is tender, a sunburn lotion or hydrocortisone cream may help.  Possible return of an abnormal heart rhythm within hours or days after the procedure. HOME CARE INSTRUCTIONS  Take medicines only as directed by your health care provider. Be sure you understand how and when to take your medicine.  Learn how to feel your pulse and check it often.  Limit your activity for 48 hours after the procedure or as directed by your health care provider.  Avoid or minimize caffeine and other stimulants as directed by your health care provider. SEEK MEDICAL CARE IF:  You feel like your heart is beating too fast or your pulse is not regular.  You have any questions about your medicines.  You have bleeding that will not stop. SEEK IMMEDIATE MEDICAL CARE IF:  You are dizzy or feel faint.  It is hard to breathe or you feel short of breath.  There is a change in discomfort in your chest.  Your speech is slurred or you have trouble moving an arm or leg on one side of your body.  You get a serious muscle cramp that does not go away.  Your fingers or toes turn cold or blue.   This information  is not intended to replace advice given to you by your health care provider. Make sure you discuss any questions you have with your health care provider.   Document Released: 06/18/2013 Document Revised: 09/18/2014 Document Reviewed: 06/18/2013 Elsevier Interactive Patient Education 2016 [employer]. Hospital doctor cardioversion is the delivery of a jolt of electricity to change the rhythm of the heart. Sticky  patches or metal paddles are placed on the chest to deliver the electricity from a device. This is done to restore a normal rhythm. A rhythm that is too fast or not regular keeps the heart from pumping well. Electrical cardioversion is done in an emergency if:   There is low or no blood pressure as a result of the heart rhythm.   Normal rhythm must be restored as fast as possible to protect the brain and heart from further damage.   It may save a life. Cardioversion may be done for heart rhythms that are not immediately life threatening, such as atrial fibrillation or flutter, in which:   The heart is beating too fast or is not regular.   Medicine to change the rhythm has not worked.   It is safe to wait in order to allow time for preparation.  Symptoms of the abnormal rhythm are bothersome.  The risk of stroke and other serious problems can be reduced. LET [facility] CARE PROVIDER KNOW ABOUT:   Any allergies you have.  All medicines you are taking, including vitamins, herbs, eye drops, creams, and over-the-counter medicines.  Previous problems you or members of your family have had with the use of anesthetics.   Any blood disorders you have.   Previous surgeries you have had.   Medical conditions you have. RISKS AND COMPLICATIONS  Generally, this is a safe procedure. However, problems can occur and include:   Breathing problems related to the anesthetic used.  A blood clot that breaks free and travels to other parts of your body. This could cause a stroke or other problems. The risk of this is lowered by use of blood-thinning medicine (anticoagulant) prior to the procedure.  Cardiac arrest (rare). BEFORE THE PROCEDURE   You may have tests to detect blood clots in your heart and to evaluate heart function.  You may start taking anticoagulants so your blood does not clot as easily.   Medicines may be given to help stabilize your heart rate and  rhythm. PROCEDURE  You will be given medicine through an IV tube to reduce discomfort and make you sleepy (sedative).   An electrical shock will be delivered. AFTER THE PROCEDURE Your heart rhythm will be watched to make sure it does not change.    This information is not intended to replace advice given to you by your health care provider. Make sure you discuss any questions you have with your health care provider.   Document Released: 08/18/2002 Document Revised: 09/18/2014 Document Reviewed: 03/12/2013 Elsevier Interactive Patient Education [employer].

## 2015-09-30 NOTE — Progress Notes (Signed)
Cardiology Clinic Note   Patient Name: Caroline Hernandez Date of Encounter: 09/30/2015  Primary Care Provider:  Art Buff, MD Primary Cardiologist:  Johnny Bridge, MD   Patient Profile    70 year old female with a history of ischemic cardiomyopathy , CAD, and atrial fibrillation, who presents for follow-up.  Past Medical History    Past Medical History  Diagnosis Date  . Chronic combined systolic and diastolic CHF (congestive heart failure) ([place])     a. 12/2014 Echo: EF 45-50%;  b. 08/2015 Echo: EF 45-50%, ant, antsept HK, mildly dil LA, nl RV, mild-mod TR, sev PAH (28mmHg).  . Diabetes mellitus without complication ([place])   . Essential hypertension   . Amputation of right lower extremity below knee ([place])   . Amputation of left lower extremity below knee ([place])   . Persistent atrial fibrillation ([place])     a. Dx 12/2014.  CHA2DS2VASc = 6--> warfarin  . GERD (gastroesophageal reflux disease)   . Hyperlipidemia   . CLL (chronic lymphocytic leukemia) ([place])   . Renal failure   . CAD (coronary artery disease)     a. 2004 Cardiac Arrest/CABG x 3 (LIMA->LAD, VG->OM, VG->RCA);  b. 06/2015 lexiscan MV: no significant ischemia, EF 48%, low risk->Med Rx.  . Ischemic cardiomyopathy     a. s/p MDT ICD (originally had [id number] lead-->gen change and lead revision ~ 2012 @ [place]); b. 12/2014 Echo: EF 45-50%;  c.08/2015 Echo: EF 45-50%, ant, antsept HK. Nl RV.  Marland Kitchen History of cardiac arrest     a. 2004.  Marland Kitchen Hiatal hernia   . CKD (chronic kidney disease), stage IV ([place])   . PAD (peripheral artery disease) ([place])     a. s/p BKA   Past Surgical History  Procedure Laterality Date  . Abdominal hysterectomy    . Insert / replace / remove pacemaker    . Amputation lower extremity bilaterally Bilateral   . Coronary artery bypass graft    . Cardiac catheterization      Allergies  Allergies  Allergen Reactions  . Zosyn [Piperacillin Sod-Tazobactam So] Other (See Comments)    Renal failure     History of Present Illness    70 year old female with the above complex past medical history. She is status post cardiac arrest and coronary artery bypass grafting 3 in 2004. This was complicated by ischemic cardiomyopathy and systolic heart failure and she subsequently underwent ICD placement in 2005. In the spring of 2016, she was diagnosed with atrial fibrillation, which has been persistent. She had been followed at [facility], and no attempt was made at restoration of sinus rhythm. She was placed on warfarin anticoagulation in the fall of 2016 and has since been followed in our Coumadin clinic. In October 2016, she was admitted to [facility] with complaints of chest pain. Stress testing was negative. She followed up in November and then again in December and expressed interest in cardioversion. Of note, echocardiogram in December showed an EF of 45-50% with mildly dilated left atrium. She also was noted to have severe pulmonary hypertension. Following that visit, she had to come off of Coumadin for a breast biopsy but has since been on Coumadin and over the past 3 weeks has had therapeutic INRs. She remains in atrial fibrillation and is also interested in proceeding with cardioversion. She occasionally notes palpitations and dyspnea on exertion but overall has been doing reasonably well. Her weight has been stable and she has not had any significant thigh edema. Occasionally, she  notes some increase in abdominal girth at the end of the day. She denies any history of chest pain, PND, orthopnea, dizziness, syncope, or early satiety.  Home Medications    Prior to Admission medications   Medication Sig Start Date End Date Taking? Authorizing Provider  amitriptyline (ELAVIL) 25 MG tablet Take 25 mg by mouth every evening. 06/02/15  Yes Historical Provider, MD  amLODipine (NORVASC) 10 MG tablet Take 1 tablet (10 mg total) by mouth daily as needed. 07/20/15  Yes Ryan M Dunn, PA-C  atorvastatin  (LIPITOR) 40 MG tablet Take 40 mg by mouth daily. 06/02/15  Yes Historical Provider, MD  carvedilol (COREG) 25 MG tablet Take 0.5 tablets (12.5 mg total) by mouth 2 (two) times daily. 09/30/15  Yes Rogelia Mire, NP  Cholecalciferol (HM VITAMIN D3) 4000 UNITS CAPS Take 4,000 Units by mouth daily.   Yes Historical Provider, MD  furosemide (LASIX) 40 MG tablet Take 1 tablet (40 mg total) by mouth 2 (two) times daily. Patient taking differently: Take 40 mg by mouth 2 (two) times daily as needed.  06/18/15  Yes Minna Merritts, MD  HUMALOG 100 UNIT/ML injection Inject 10-14 Units into the skin See admin instructions. Inject 12 units subcutaneous in the morning with breakfast, 10 units subcutaneous at lunch, and 14 units subcutaneous at suppertime. 05/28/15  Yes Historical Provider, MD  isosorbide mononitrate (IMDUR) 30 MG 24 hr tablet Take 1 tablet (30 mg total) by mouth 2 (two) times daily. 06/18/15  Yes Minna Merritts, MD  LANTUS 100 UNIT/ML injection Inject 14-35 Units into the skin See admin instructions. Inject 35 units subcutaneous once a day in the morning. Inject 14 units subcutaneous once a day at bedtime. 05/31/15  Yes Historical Provider, MD  losartan (COZAAR) 100 MG tablet Take 100 mg by mouth daily. 06/02/15  Yes Historical Provider, MD  Multiple Vitamins-Minerals (OCUTABS-LUTEIN) TABS Take 1 tablet by mouth daily.   Yes Historical Provider, MD  nitroGLYCERIN (NITROSTAT) 0.4 MG SL tablet Place 1 tablet (0.4 mg total) under the tongue every 5 (five) minutes as needed for chest pain. 06/14/15  Yes Sital Mody, MD  pantoprazole (PROTONIX) 40 MG tablet Take 40 mg by mouth daily. 05/24/15  Yes Historical Provider, MD  POLY-IRON 150 150 MG capsule Take 150 mg by mouth daily. 05/24/15  Yes Historical Provider, MD  PREVIDENT 5000 DRY MOUTH 1.1 % GEL dental gel Take by mouth daily. Brush teeth with paste for at least 1 minute once a day. 03/25/15  Yes Historical Provider, MD  sertraline (ZOLOFT) 100 MG  tablet Take 100 mg by mouth daily. 06/02/15  Yes Historical Provider, MD  vitamin C (ASCORBIC ACID) 500 MG tablet Take 500 mg by mouth daily.   Yes Historical Provider, MD  warfarin (COUMADIN) 4 MG tablet Take as directed by Coumadin Clinic 09/15/15  Yes Minna Merritts, MD  amiodarone (PACERONE) 200 MG tablet Take 1 tablet (200 mg total) by mouth 2 (two) times daily. 09/30/15   Rogelia Mire, NP    Family History    Family History  Problem Relation Age of Onset  . CAD Mother   . Diabetes Mother   . Atrial fibrillation Mother   . Lung cancer Father   . Diabetes Father     Social History    Social History   Social History  . Marital Status: Divorced    Spouse Name: N/A  . Number of Children: N/A  . Years of Education: N/A  Occupational History  . Not on file.   Social History Main Topics  . Smoking status: Never Smoker   . Smokeless tobacco: Not on file  . Alcohol Use: No  . Drug Use: No  . Sexual Activity: Not on file   Other Topics Concern  . Not on file   Social History Narrative     Review of Systems    General:  No chills, fever, night sweats or weight changes.  Cardiovascular:  No chest pain, positive dyspnea on exertion, occasional thigh edema and increase in abdominal girth,  No orthopnea, palpitations, paroxysmal nocturnal dyspnea. Dermatological: No rash, lesions/masses Respiratory: No cough, positive dyspnea on exertion. Urologic: No hematuria, dysuria Abdominal:   No nausea, vomiting, diarrhea, bright red blood per rectum, melena, or hematemesis Neurologic:  No visual changes, wkns, changes in mental status. All other systems reviewed and are otherwise negative except as noted above.  Physical Exam    VS:  BP 154/72 mmHg  Pulse 56  Ht 5\' 8"  (1.727 m)  Wt 183 lb (83.008 kg)  BMI 27.83 kg/m2 , BMI Body mass index is 27.83 kg/(m^2). GEN: Well nourished, well developed, in no acute distress.  HEENT: normal.  Neck: Supple, no JVD, carotid  bruits, or masses. Cardiac: IR, IR, 2/6 syst murmur @ llsb, no rubs, or gallops. No clubbing, cyanosis, edema. bilat bka w/ prostheses. Radials 2+ and equal bilaterally.  Respiratory: Respirations regular and unlabored, clear to auscultation bilaterally. GI: Soft, nontender, nondistended, BS + x 4. MS: no deformity or atrophy. Skin: warm and dry, no rash. Neuro: Strength and sensation are intact. Psych: Normal affect.  Accessory Clinical Findings    ECG - atrial fibrillation, 56, left axis deviation, IVCD, lateral T-wave inversion. No acute ST or T changes.  Assessment & Plan   1.  Persistent atrial fibrillation: This was diagnosed in the spring of 2016. She has been anticoagulated since the fall of 2016. She is well rate controlled on beta blocker therapy. She thinks that some of her dyspnea on exertion is attributable to her A. fib and she also notes occasional palpitations. She would like to have an opportunity to not be in atrial fibrillation. Her INR has been therapeutic for the past 3 weeks and she is scheduled to have repeat INR next Wednesday. I have discussed her case with Dr. Rockey Situ. We will initiate amiodarone 200 mg twice a day today and have arranged for elective cardioversion in one week. If successful, we would hope to be able to get her off of amiodarone at some point in the future. In adding amiodarone, I am reducing her carvedilol to 12.5 mg twice a day.  2. Chronic combined systolic and diastolic congestive heart failure/ischemic cardiopathy: She is euvolemic on exam today and her weight has been stable in the 185 range. Her blood pressure is elevated though she notes that it is generally better at home. She remains on amlodipine, beta blocker, Lasix, nitrate, and losartan.  3. Hypertensive heart disease: Blood pressure elevated today. She notes that it has been better at home. Continue current regimen.  4. Pulmonary hypertension: Pulmonary arterial systolic pressure of  66 m mercury on recent echo. RV systolic pressure from echo in April 2016 was 50. Patient's not having any dyspnea. She does does not have a history of smoking or COPD. Question role of obesity and possibly sleep apnea.  5. Status post ICD: This is still being [facility] at [facility]. She would like to transfer care to device clinic  either here in [place] or in [place]. We will need to arrange for this.  6. Stage IV chronic kidney disease: Renal function was stable in November. She is due to have labs pre-cardioversion next week.  7. Disposition follow-up labs next week with plan for cardioversion next Thursday.  Murray Hodgkins, NP 09/30/2015, 4:49 PM

## 2015-10-06 ENCOUNTER — Ambulatory Visit (INDEPENDENT_AMBULATORY_CARE_PROVIDER_SITE_OTHER): Payer: Medicare HMO

## 2015-10-06 ENCOUNTER — Telehealth: Payer: Self-pay

## 2015-10-06 ENCOUNTER — Telehealth: Payer: Self-pay | Admitting: *Deleted

## 2015-10-06 ENCOUNTER — Other Ambulatory Visit
Admission: RE | Admit: 2015-10-06 | Discharge: 2015-10-06 | Disposition: A | Discharge status: Home / Self Care | Payer: Medicare HMO | Source: Ambulatory Visit | Attending: Nurse Practitioner | Admitting: Nurse Practitioner

## 2015-10-06 DIAGNOSIS — Z7901 Long term (current) use of anticoagulants: Secondary | ICD-10-CM | POA: Diagnosis not present

## 2015-10-06 DIAGNOSIS — I481 Persistent atrial fibrillation: Secondary | ICD-10-CM

## 2015-10-06 DIAGNOSIS — I4819 Other persistent atrial fibrillation: Secondary | ICD-10-CM

## 2015-10-06 LAB — CBC
HCT: 41.7 % (ref 35.0–47.0)
HEMOGLOBIN: 13.4 g/dL (ref 12.0–16.0)
MCH: 27.2 pg (ref 26.0–34.0)
MCHC: 32.2 g/dL (ref 32.0–36.0)
MCV: 84.7 fL (ref 80.0–100.0)
PLATELETS: 168 10*3/uL (ref 150–440)
RBC: 4.92 MIL/uL (ref 3.80–5.20)
RDW: 16 % — ABNORMAL HIGH (ref 11.5–14.5)
WBC: 23.1 10*3/uL — AB (ref 3.6–11.0)

## 2015-10-06 LAB — POCT INR: INR: 2.4

## 2015-10-06 LAB — BASIC METABOLIC PANEL
ANION GAP: 15 (ref 5–15)
BUN: 33 mg/dL — ABNORMAL HIGH (ref 6–20)
CHLORIDE: 105 mmol/L (ref 101–111)
CO2: 20 mmol/L — ABNORMAL LOW (ref 22–32)
Calcium: 9.5 mg/dL (ref 8.9–10.3)
Creatinine, Ser: 1.82 mg/dL — ABNORMAL HIGH (ref 0.44–1.00)
GFR calc Af Amer: 32 mL/min — ABNORMAL LOW (ref >60)
GFR, EST NON AFRICAN AMERICAN: 28 mL/min — AB (ref >60)
GLUCOSE: 93 mg/dL (ref 65–99)
POTASSIUM: 4.2 mmol/L (ref 3.5–5.1)
Sodium: 140 mmol/L (ref 135–145)

## 2015-10-06 NOTE — Telephone Encounter (Signed)
[facility] requesting call back.  Gave device clinic phone number.

## 2015-10-06 NOTE — Telephone Encounter (Signed)
S/w pt daughter [name]. Per [facility], DCCV time tomorrow changed to 8am. Pt to arrive at 7am Reviewed change in arrival time and instructions w/Jennifer who verbalized understanding regarding meds and NPO status. She had no further questions.  S/w Raquel Sarna at [facility] who has confirmed w/[employer] rep to be at [facility] to check device after DCCV. Dr. Rockey Situ aware and agreeable w/plan.

## 2015-10-07 ENCOUNTER — Encounter: Payer: Self-pay | Admitting: Internal Medicine

## 2015-10-07 ENCOUNTER — Encounter: Payer: Self-pay | Admitting: Anesthesiology

## 2015-10-07 ENCOUNTER — Ambulatory Visit
Admission: RE | Admit: 2015-10-07 | Discharge: 2015-10-07 | Disposition: A | Discharge status: Home / Self Care | Payer: Medicare HMO | Source: Ambulatory Visit | Attending: Cardiovascular Disease | Admitting: Cardiovascular Disease

## 2015-10-07 ENCOUNTER — Ambulatory Visit: Payer: Medicare HMO | Admitting: Anesthesiology

## 2015-10-07 ENCOUNTER — Encounter
Admission: RE | Disposition: A | Discharge status: Home / Self Care | Payer: Self-pay | Source: Ambulatory Visit | Attending: Cardiovascular Disease

## 2015-10-07 DIAGNOSIS — R0602 Shortness of breath: Secondary | ICD-10-CM | POA: Insufficient documentation

## 2015-10-07 DIAGNOSIS — I481 Persistent atrial fibrillation: Secondary | ICD-10-CM | POA: Insufficient documentation

## 2015-10-07 DIAGNOSIS — Z7901 Long term (current) use of anticoagulants: Secondary | ICD-10-CM | POA: Diagnosis not present

## 2015-10-07 DIAGNOSIS — I130 Hypertensive heart and chronic kidney disease with heart failure and stage 1 through stage 4 chronic kidney disease, or unspecified chronic kidney disease: Secondary | ICD-10-CM | POA: Insufficient documentation

## 2015-10-07 DIAGNOSIS — I2510 Atherosclerotic heart disease of native coronary artery without angina pectoris: Secondary | ICD-10-CM | POA: Insufficient documentation

## 2015-10-07 DIAGNOSIS — E785 Hyperlipidemia, unspecified: Secondary | ICD-10-CM | POA: Diagnosis not present

## 2015-10-07 DIAGNOSIS — E1122 Type 2 diabetes mellitus with diabetic chronic kidney disease: Secondary | ICD-10-CM | POA: Insufficient documentation

## 2015-10-07 DIAGNOSIS — Z89611 Acquired absence of right leg above knee: Secondary | ICD-10-CM | POA: Diagnosis not present

## 2015-10-07 DIAGNOSIS — I5042 Chronic combined systolic (congestive) and diastolic (congestive) heart failure: Secondary | ICD-10-CM | POA: Insufficient documentation

## 2015-10-07 DIAGNOSIS — Z801 Family history of malignant neoplasm of trachea, bronchus and lung: Secondary | ICD-10-CM | POA: Insufficient documentation

## 2015-10-07 DIAGNOSIS — Z794 Long term (current) use of insulin: Secondary | ICD-10-CM | POA: Diagnosis not present

## 2015-10-07 DIAGNOSIS — N184 Chronic kidney disease, stage 4 (severe): Secondary | ICD-10-CM | POA: Diagnosis not present

## 2015-10-07 DIAGNOSIS — I252 Old myocardial infarction: Secondary | ICD-10-CM | POA: Diagnosis not present

## 2015-10-07 DIAGNOSIS — K219 Gastro-esophageal reflux disease without esophagitis: Secondary | ICD-10-CM | POA: Diagnosis not present

## 2015-10-07 DIAGNOSIS — Z89612 Acquired absence of left leg above knee: Secondary | ICD-10-CM | POA: Insufficient documentation

## 2015-10-07 DIAGNOSIS — C9110 Chronic lymphocytic leukemia of B-cell type not having achieved remission: Secondary | ICD-10-CM | POA: Insufficient documentation

## 2015-10-07 DIAGNOSIS — Z8249 Family history of ischemic heart disease and other diseases of the circulatory system: Secondary | ICD-10-CM | POA: Insufficient documentation

## 2015-10-07 DIAGNOSIS — I739 Peripheral vascular disease, unspecified: Secondary | ICD-10-CM | POA: Diagnosis not present

## 2015-10-07 DIAGNOSIS — R0609 Other forms of dyspnea: Secondary | ICD-10-CM | POA: Diagnosis not present

## 2015-10-07 DIAGNOSIS — Z881 Allergy status to other antibiotic agents status: Secondary | ICD-10-CM | POA: Diagnosis not present

## 2015-10-07 DIAGNOSIS — Z90710 Acquired absence of both cervix and uterus: Secondary | ICD-10-CM | POA: Insufficient documentation

## 2015-10-07 DIAGNOSIS — Z79899 Other long term (current) drug therapy: Secondary | ICD-10-CM | POA: Diagnosis not present

## 2015-10-07 DIAGNOSIS — I454 Nonspecific intraventricular block: Secondary | ICD-10-CM | POA: Diagnosis not present

## 2015-10-07 DIAGNOSIS — I4891 Unspecified atrial fibrillation: Secondary | ICD-10-CM | POA: Diagnosis present

## 2015-10-07 DIAGNOSIS — Z833 Family history of diabetes mellitus: Secondary | ICD-10-CM | POA: Diagnosis not present

## 2015-10-07 DIAGNOSIS — I4819 Other persistent atrial fibrillation: Secondary | ICD-10-CM

## 2015-10-07 DIAGNOSIS — K449 Diaphragmatic hernia without obstruction or gangrene: Secondary | ICD-10-CM | POA: Diagnosis not present

## 2015-10-07 DIAGNOSIS — Z951 Presence of aortocoronary bypass graft: Secondary | ICD-10-CM | POA: Diagnosis not present

## 2015-10-07 DIAGNOSIS — I255 Ischemic cardiomyopathy: Secondary | ICD-10-CM | POA: Diagnosis not present

## 2015-10-07 HISTORY — PX: ELECTROPHYSIOLOGIC STUDY: SHX172A

## 2015-10-07 SURGERY — CARDIOVERSION (CATH LAB)
Anesthesia: General

## 2015-10-07 MED ORDER — SODIUM CHLORIDE 0.9 % IV SOLN
INTRAVENOUS | Status: DC
  Administered 2015-10-07: 08:00:00 via INTRAVENOUS

## 2015-10-07 MED ORDER — PROPOFOL 10 MG/ML IV BOLUS
INTRAVENOUS | Status: DC | PRN
  Administered 2015-10-07: 50 mg via INTRAVENOUS
  Administered 2015-10-07: 10 mg via INTRAVENOUS

## 2015-10-07 NOTE — Transfer of Care (Signed)
Immediate Anesthesia Transfer of Care Note  Patient: Caroline Hernandez  Procedure(s) Performed: Procedure(s): CARDIOVERSION (N/A)  Patient Location: IR   Anesthesia Type:General  Level of Consciousness: awake, alert  and oriented  Airway & Oxygen Therapy: Patient Spontanous Breathing and Patient connected to nasal cannula oxygen  Post-op Assessment: Report given to RN and Post -op Vital signs reviewed and stable  Post vital signs: Reviewed and stable  Last Vitals:  Filed Vitals:   10/07/15 0817 10/07/15 0818  BP: 129/59 128/60  Pulse: 61 61  Temp:    Resp:  18    Complications: No apparent anesthesia complications

## 2015-10-07 NOTE — Anesthesia Preprocedure Evaluation (Signed)
Anesthesia Evaluation  Patient identified by MRN, date of birth, ID band Patient awake    Reviewed: Allergy & Precautions, H&P , NPO status , Patient's Chart, lab work & pertinent test results  History of Anesthesia Complications Negative for: history of anesthetic complications  Airway Mallampati: III  TM Distance: >3 FB Neck ROM: limited    Dental  (+) Poor Dentition, Chipped, Missing   Pulmonary neg pulmonary ROS, shortness of breath,    Pulmonary exam normal breath sounds clear to auscultation       Cardiovascular Exercise Tolerance: Poor hypertension, (-) angina+ CAD, + CABG, + Peripheral Vascular Disease, +CHF and + DOE  + dysrhythmias Atrial Fibrillation  Rhythm:irregular Rate:Normal     Neuro/Psych  Neuromuscular disease negative psych ROS   GI/Hepatic Neg liver ROS, hiatal hernia, GERD  Controlled,  Endo/Other  diabetes, Poorly Controlled, Type 2, Insulin Dependent  Renal/GU Renal diseasenegative Renal ROS  negative genitourinary   Musculoskeletal   Abdominal   Peds  Hematology negative hematology ROS (+)   Anesthesia Other Findings Past Medical History:   Chronic combined systolic and diastolic CHF (c*                Comment:a. 12/2014 Echo: EF 45-50%;  b. 08/2015 Echo: EF              45-50%, ant, antsept HK, mildly dil LA, nl RV,               mild-mod TR, sev PAH (20mmHg).   Diabetes mellitus without complication ([place])                 Essential hypertension                                       Amputation of right lower extremity below knee*              Amputation of left lower extremity below knee *              Persistent atrial fibrillation ([facility])                           Comment:a. Dx 12/2014.  CHA2DS2VASc = 6--> warfarin   GERD (gastroesophageal reflux disease)                       Hyperlipidemia                                               CLL (chronic lymphocytic leukemia) ([facility])                      Renal failure                                                CAD (coronary artery disease)                                  Comment:a. 2004 Cardiac Arrest/CABG x 3 (LIMA->LAD,  VG->OM, VG->RCA);  b. 06/2015 lexiscan MV: no               significant ischemia, EF 48%, low risk->Med Rx.   Ischemic cardiomyopathy                                        Comment:a. s/p MDT ICD (originally had [id number] lead-->gen               change and lead revision ~ 2012 @ [place]); b.               12/2014 Echo: EF 45-50%;  c.08/2015 Echo: EF               45-50%, ant, antsept HK. Nl RV.   History of cardiac arrest                                      Comment:a. 2004.   Hiatal hernia                                                CKD (chronic kidney disease), stage IV ([facility])                 PAD (peripheral artery disease) ([facility])                          Comment:a. s/p BKA  Past Surgical History:   ABDOMINAL HYSTERECTOMY                                        INSERT / REPLACE / REMOVE PACEMAKER                           Amputation lower extremity bilaterally          Bilateral              CORONARY ARTERY BYPASS GRAFT                                  CARDIAC CATHETERIZATION                                       Signs and symptoms suggestive of sleep apnea    Reproductive/Obstetrics negative OB ROS                             Anesthesia Physical Anesthesia Plan  ASA: IV  Anesthesia Plan: General   Post-op Pain Management:    Induction:   Airway Management Planned:   Additional Equipment:   Intra-op Plan:   Post-operative Plan:   Informed Consent: I have reviewed the patients History and Physical, chart, labs and discussed the procedure including the risks, benefits and alternatives for the proposed anesthesia with the patient or authorized representative who has indicated his/her understanding and acceptance.  Dental Advisory Given  Plan Discussed  with: Anesthesiologist, CRNA and Surgeon  Anesthesia Plan Comments:         Anesthesia Quick Evaluation

## 2015-10-07 NOTE — Telephone Encounter (Signed)
Late entry from 10/06/15:  Spoke with [employer] rep, patient has a single chamber [employer] PPM.  Made Alysia Penna, RN aware.

## 2015-10-07 NOTE — CV Procedure (Signed)
Cardioversion procedure note For atrial fibrillation.  Procedure Details:  Consent: Risks of procedure as well as the alternatives and risks of each were explained to the (patient/caregiver). Consent for procedure obtained.  Time Out: Verified patient identification, verified procedure, site/side was marked, verified correct patient position, special equipment/implants available, medications/allergies/relevent history reviewed, required imaging and test results available. Performed  Patient placed on cardiac monitor, pulse oximetry, supplemental oxygen as necessary.  Sedation given: propofol IV, Dr. Janett Billow Pacer pads placed anterior and posterior chest.   Cardioverted 1 time(s).  Cardioverted at  120 J. Synchronized biphasic Converted to NSR   Evaluation: Findings: Post procedure EKG shows: NSR Complications: None Patient did tolerate procedure well. ICD will be checked before patient goes home from specials   Time Spent Directly with the Patient:  Total encounter time more than 45 minutes  Greater than 50% was spent in counseling and coordination of care with the patient  Signed, Esmond Plants, MD, Ph.D [facility]

## 2015-10-07 NOTE — Anesthesia Postprocedure Evaluation (Signed)
Anesthesia Post Note  Patient: Caroline Hernandez  Procedure(s) Performed: Procedure(s) (LRB): CARDIOVERSION (N/A)  Patient location during evaluation: Cath Lab Anesthesia Type: General Level of consciousness: awake and alert Pain management: pain level controlled Vital Signs Assessment: post-procedure vital signs reviewed and stable Respiratory status: spontaneous breathing, nonlabored ventilation, respiratory function stable and patient connected to nasal cannula oxygen Cardiovascular status: blood pressure returned to baseline and stable Postop Assessment: no signs of nausea or vomiting Anesthetic complications: no    Last Vitals:  Filed Vitals:   10/07/15 0913 10/07/15 0933  BP: 135/72 114/57  Pulse: 50 50  Temp:    Resp: 13 14    Last Pain: There were no vitals filed for this visit.               Precious Haws Piscitello

## 2015-10-07 NOTE — Progress Notes (Unsigned)
aske d to review irregular HR  ECG >> MBZ1 heart block  Plan is to followup and establish with [facility] incl me  ahs SOB ? 2/2 Afib or bradycardia   Will arrange appt

## 2015-10-07 NOTE — Procedures (Signed)
Called Dr Rockey Situ patient went back into afib.  No additional orders needed.  Patient to stay on same med regimen.

## 2015-10-13 ENCOUNTER — Ambulatory Visit (INDEPENDENT_AMBULATORY_CARE_PROVIDER_SITE_OTHER): Payer: Medicare HMO

## 2015-10-13 DIAGNOSIS — I481 Persistent atrial fibrillation: Secondary | ICD-10-CM

## 2015-10-13 DIAGNOSIS — Z7901 Long term (current) use of anticoagulants: Secondary | ICD-10-CM

## 2015-10-13 DIAGNOSIS — I4819 Other persistent atrial fibrillation: Secondary | ICD-10-CM

## 2015-10-13 LAB — POCT INR: INR: 2.6

## 2015-10-21 ENCOUNTER — Ambulatory Visit (INDEPENDENT_AMBULATORY_CARE_PROVIDER_SITE_OTHER): Payer: Medicare HMO | Admitting: Physician Assistant

## 2015-10-21 ENCOUNTER — Encounter: Payer: Self-pay | Admitting: Physician Assistant

## 2015-10-21 ENCOUNTER — Ambulatory Visit (INDEPENDENT_AMBULATORY_CARE_PROVIDER_SITE_OTHER): Payer: Medicare HMO

## 2015-10-21 VITALS — BP 144/76 | HR 70 | Ht 68.0 in | Wt 183.0 lb

## 2015-10-21 DIAGNOSIS — I481 Persistent atrial fibrillation: Secondary | ICD-10-CM | POA: Diagnosis not present

## 2015-10-21 DIAGNOSIS — I4891 Unspecified atrial fibrillation: Secondary | ICD-10-CM

## 2015-10-21 DIAGNOSIS — Z7901 Long term (current) use of anticoagulants: Secondary | ICD-10-CM

## 2015-10-21 DIAGNOSIS — I5042 Chronic combined systolic (congestive) and diastolic (congestive) heart failure: Secondary | ICD-10-CM | POA: Diagnosis not present

## 2015-10-21 DIAGNOSIS — I4819 Other persistent atrial fibrillation: Secondary | ICD-10-CM

## 2015-10-21 DIAGNOSIS — I2510 Atherosclerotic heart disease of native coronary artery without angina pectoris: Secondary | ICD-10-CM

## 2015-10-21 DIAGNOSIS — I110 Hypertensive heart disease with heart failure: Secondary | ICD-10-CM

## 2015-10-21 DIAGNOSIS — N184 Chronic kidney disease, stage 4 (severe): Secondary | ICD-10-CM

## 2015-10-21 LAB — POCT INR: INR: 2.2

## 2015-10-21 MED ORDER — CARVEDILOL 25 MG PO TABS: 25.0000 mg | ORAL_TABLET | Freq: Two times a day (BID) | ORAL | Status: DC

## 2015-10-21 MED ORDER — AMIODARONE HCL 200 MG PO TABS: 200.0000 mg | ORAL_TABLET | Freq: Every day | ORAL | Status: DC

## 2015-10-21 NOTE — Patient Instructions (Signed)
Medication Instructions:  Your physician has recommended you make the following change in your medication:  DECREASE amiodarone to 200mg  daily INCREASE coreg to 25mg  twice daily   Labwork: none  Testing/Procedures: none  Follow-Up: Keep your appointment with Dr. Caryl Comes.   Any Other Special Instructions Will Be Listed Below (If Applicable).     If you need a refill on your cardiac medications before your next appointment, please call your pharmacy.

## 2015-10-21 NOTE — Progress Notes (Signed)
Cardiology Office Note Date:  10/21/2015  Patient ID:  Caroline Hernandez, DOB 1946/01/10, MRN [id number] PCP:  Art Buff, MD  Cardiologist:  Dr. Rockey Situ, MD    Chief Complaint: Follow up DCCV  History of Present Illness: Caroline Hernandez is a 70 y.o. female with history of CAD s/p 3 vessel CABG, ischemic cardiomyoapthy with history of cardiac arrest in 2004 s/p MDT ICD, chronic combined CHF, persistent Afib on Coumadin s/p successful DCCV 10/07/2015, CKD stage IV, PAD s/p bilateral amputation of the lower extremities below the knee, CLL, DM, HTN, and HLD who presents for routine follow up.   She is status post cardiac arrest and coronary artery bypass grafting 3 in 2004. This was complicated by ischemic cardiomyopathy and systolic heart failure and she subsequently underwent ICD placement in 2005. In the spring of 2016, she was diagnosed with atrial fibrillation, which has been persistent. She had been followed at [facility], and no attempt was made at restoration of sinus rhythm. She was placed on warfarin anticoagulation in the fall of 2016 and has since been followed in our Coumadin clinic. In October 2016, she was admitted to [facility] with complaints of chest pain. Stress testing was negative. She followed up in November and then again in December and expressed interest in cardioversion. Of note, echocardiogram in December showed an EF of 45-50% with mildly dilated left atrium. She also was noted to have severe pulmonary hypertension. Following that visit, she had to come off of Coumadin for a breast biopsy but has since been on Coumadin and over the past 3 weeks has had therapeutic INRs. She was seen recently on 09/30/15, remaining in Afib and was interested in DCCV. She occasionally noted palpitations and DOE, but was overall doing well. She was started on amiodarone 200 mg bid and underwent DCCV on 1/26. It appears she went back into Mobitz Type 1 HB from review the notes.    Today, she reports she is doing great. No complaints. Breathing is much better. Tolerating her medications. No palpitations, chest pain, swelling, orthopnea, PND, or early satiety. She does not have any concerns today.      Past Medical History  Diagnosis Date  . Chronic combined systolic and diastolic CHF (congestive heart failure) ([place])     a. 12/2014 Echo: EF 45-50%;  b. 08/2015 Echo: EF 45-50%, ant, antsept HK, mildly dil LA, nl RV, mild-mod TR, sev PAH (86mmHg).  . Diabetes mellitus without complication ([place])   . Essential hypertension   . Amputation of right lower extremity below knee ([place])   . Amputation of left lower extremity below knee ([place])   . Persistent atrial fibrillation ([place])     a. Dx 12/2014.  CHA2DS2VASc = 6--> warfarin  . GERD (gastroesophageal reflux disease)   . Hyperlipidemia   . CLL (chronic lymphocytic leukemia) ([place])   . Renal failure   . CAD (coronary artery disease)     a. 2004 Cardiac Arrest/CABG x 3 (LIMA->LAD, VG->OM, VG->RCA);  b. 06/2015 lexiscan MV: no significant ischemia, EF 48%, low risk->Med Rx.  . Ischemic cardiomyopathy     a. s/p MDT ICD (originally had [id number] lead-->gen change and lead revision ~ 2012 @ [place]); b. 12/2014 Echo: EF 45-50%;  c.08/2015 Echo: EF 45-50%, ant, antsept HK. Nl RV.  Marland Kitchen History of cardiac arrest     a. 2004.  Marland Kitchen Hiatal hernia   . CKD (chronic kidney disease), stage IV ([place])   . PAD (peripheral artery disease) ([place])  a. s/p BKA    Past Surgical History  Procedure Laterality Date  . Abdominal hysterectomy    . Insert / replace / remove pacemaker    . Amputation lower extremity bilaterally Bilateral   . Coronary artery bypass graft    . Cardiac catheterization    . Electrophysiologic study N/A 10/07/2015    Procedure: CARDIOVERSION;  Surgeon: Minna Merritts, MD;  Location: [facility] ORS;  Service: Cardiovascular;  Laterality: N/A;    Current Outpatient Prescriptions  Medication Sig Dispense Refill  . amiodarone  (PACERONE) 200 MG tablet Take 1 tablet (200 mg total) by mouth daily. 30 tablet 5  . amitriptyline (ELAVIL) 25 MG tablet Take 25 mg by mouth every evening.  11  . amLODipine (NORVASC) 10 MG tablet Take 1 tablet (10 mg total) by mouth daily as needed. (Patient taking differently: Take 10 mg by mouth daily. ) 90 tablet 3  . atorvastatin (LIPITOR) 40 MG tablet Take 40 mg by mouth daily.  11  . carvedilol (COREG) 25 MG tablet Take 1 tablet (25 mg total) by mouth 2 (two) times daily. 60 tablet 11  . Cholecalciferol (HM VITAMIN D3) 4000 UNITS CAPS Take 4,000 Units by mouth daily.    . furosemide (LASIX) 40 MG tablet Take 1 tablet (40 mg total) by mouth 2 (two) times daily. (Patient taking differently: Take 40 mg by mouth 2 (two) times daily as needed. ) 60 tablet 11  . HUMALOG 100 UNIT/ML injection Inject 10-14 Units into the skin See admin instructions. Inject 12 units subcutaneous in the morning with breakfast, 10 units subcutaneous at lunch, and 14 units subcutaneous at suppertime.  6  . isosorbide mononitrate (IMDUR) 30 MG 24 hr tablet Take 1 tablet (30 mg total) by mouth 2 (two) times daily. 60 tablet 11  . LANTUS 100 UNIT/ML injection Inject 14-35 Units into the skin See admin instructions. Inject 35 units subcutaneous once a day in the morning. Inject 14 units subcutaneous once a day at bedtime.  0  . losartan (COZAAR) 100 MG tablet Take 100 mg by mouth daily.  11  . Multiple Vitamins-Minerals (OCUTABS-LUTEIN) TABS Take 1 tablet by mouth daily.    . nitroGLYCERIN (NITROSTAT) 0.4 MG SL tablet Place 1 tablet (0.4 mg total) under the tongue every 5 (five) minutes as needed for chest pain. 30 tablet 0  . pantoprazole (PROTONIX) 40 MG tablet Take 40 mg by mouth daily.  0  . POLY-IRON 150 150 MG capsule Take 150 mg by mouth daily.  3  . PREVIDENT 5000 DRY MOUTH 1.1 % GEL dental gel Take by mouth daily. Brush teeth with paste for at least 1 minute once a day.  5  . sertraline (ZOLOFT) 100 MG tablet Take  100 mg by mouth daily.  11  . vitamin C (ASCORBIC ACID) 500 MG tablet Take 500 mg by mouth daily.    Marland Kitchen warfarin (COUMADIN) 4 MG tablet Take as directed by Coumadin Clinic 50 tablet 3   No current facility-administered medications for this visit.    Allergies:   Zosyn   Social History:  The patient  reports that she has never smoked. She does not have any smokeless tobacco history on file. She reports that she does not drink alcohol or use illicit drugs.   Family History:  The patient's family history includes Atrial fibrillation in her mother; CAD in her mother; Diabetes in her father and mother; Lung cancer in her father.  ROS:   Review of Systems  Constitutional: Negative for fever, chills, weight loss, malaise/fatigue and diaphoresis.  HENT: Negative for congestion.   Eyes: Negative for discharge and redness.  Respiratory: Negative for cough, hemoptysis, sputum production, shortness of breath and wheezing.   Cardiovascular: Negative for chest pain, palpitations, orthopnea, claudication, leg swelling and PND.  Gastrointestinal: Negative for heartburn, nausea, vomiting, abdominal pain, blood in stool and melena.  Genitourinary: Negative for hematuria.  Musculoskeletal: Negative for myalgias, joint pain and falls.  Skin: Negative for rash.  Neurological: Negative for dizziness, tremors, sensory change, speech change, focal weakness, loss of consciousness and weakness.  Endo/Heme/Allergies: Does not bruise/bleed easily.  Psychiatric/Behavioral: Negative for substance abuse. The patient is not nervous/anxious.   All other systems reviewed and are negative.    PHYSICAL EXAM:  VS:  BP 144/76 mmHg  Pulse 70  Ht 5\' 8"  (1.727 m)  Wt 183 lb (83.008 kg)  BMI 27.83 kg/m2 BMI: Body mass index is 27.83 kg/(m^2). Well nourished, well developed, in no acute distress HEENT: normocephalic, atraumatic Neck: no JVD, carotid bruits or masses Cardiac:  normal S1, S2; RRR; no murmurs, rubs, or  gallops Lungs:  clear to auscultation bilaterally, no wheezing, rhonchi or rales Abd: soft, nontender, no hepatomegaly, + BS MS: no deformity or atrophy Ext: no edema Skin: warm and dry, no rash Neuro:  moves all extremities spontaneously, no focal abnormalities noted, follows commands Psych: euthymic mood, full affect   EKG:  Was ordered today. Shows NSR, 70 bpm, 1st degree AV block, left axis deviation, lateral TWI  Recent Labs: 07/20/2015: BNP 333.5* 10/06/2015: BUN 33*; Creatinine, Ser 1.82*; Hemoglobin 13.4; Platelets 168; Potassium 4.2; Sodium 140  06/13/2015: Cholesterol 91; HDL 25*; LDL Cholesterol 40; Total CHOL/HDL Ratio 3.6; Triglycerides 128; VLDL 26   Estimated Creatinine Clearance: 33.9 mL/min (by C-G formula based on Cr of 1.82).   Wt Readings from Last 3 Encounters:  10/21/15 183 lb (83.008 kg)  10/07/15 183 lb (83.008 kg)  09/30/15 183 lb (83.008 kg)     Other studies reviewed: Additional studies/records reviewed today include: summarized above  ASSESSMENT AND PLAN:  1. Persistent Afib s/p recent successful DCCV: She is maintaining sinus rhythm today with a heart rate in the 70's and doing well. Decrease amiodarone to 100 mg daily. Increase her Coreg to 25 mg bid as well given her blood pressure, see below. Continue Coumadin per Coumadin Clinic. CHADSVASc 6 (CHF, HTN, age x 1, DM, vascular disease, female).   2. CAD s/p CABG as above: No symptoms concerning for angina. Continue current medications. No further ischemic work up at this time.  3. Chronic combined CHF s/p ICD: She does not appear to be volume overloaded at this time. Continue current medications.   4. Hypertensive heart disease: Blood pressure is mildly elevated in the office today. BP runs in the [id number] systolic at home. Increase Coreg as above. Should she note soft BP at home she will decrease her Coreg back to 12.5 mg bid and let us know. Continue remaining medications.   5. Stage IV CKD: Stable. Per  PCP.   Disposition: F/u with Dr. Caryl Comes at her already scheduled appointment in one month  Current medicines are reviewed at length with the patient today.  The patient did not have any concerns regarding medicines.  Melvern Banker PA-C 10/21/2015 4:10 PM     [address] [place], [place] [address] [phone]

## 2015-11-03 ENCOUNTER — Ambulatory Visit (INDEPENDENT_AMBULATORY_CARE_PROVIDER_SITE_OTHER): Payer: Medicare HMO | Admitting: *Deleted

## 2015-11-03 DIAGNOSIS — I481 Persistent atrial fibrillation: Secondary | ICD-10-CM

## 2015-11-03 DIAGNOSIS — Z7901 Long term (current) use of anticoagulants: Secondary | ICD-10-CM | POA: Diagnosis not present

## 2015-11-03 DIAGNOSIS — I4819 Other persistent atrial fibrillation: Secondary | ICD-10-CM

## 2015-11-15 ENCOUNTER — Telehealth: Payer: Self-pay | Admitting: Cardiovascular Disease

## 2015-11-15 NOTE — Telephone Encounter (Signed)
Pt states she has to have a breat bx, and needs to stop coumadin 6 days before. Please call.

## 2015-11-16 NOTE — Telephone Encounter (Signed)
Pt is having a breast biopsy at [facility] on 3/13 in their breast clinic [phone]. Spoke with Judeen Hammans who stated that they did not need written clearance.  Pt is on Coumadin for afib, CHADS2 score of 3. Ok to hold Coumadin x5 days prior to procedure and restart in the evening. Pt is aware and received a verbal confirmation from [facility] with [facility] that holding 5 days is sufficient before biopsy.

## 2015-11-18 ENCOUNTER — Encounter: Payer: Self-pay | Admitting: Internal Medicine

## 2015-11-18 ENCOUNTER — Other Ambulatory Visit: Payer: Self-pay

## 2015-11-18 ENCOUNTER — Ambulatory Visit (INDEPENDENT_AMBULATORY_CARE_PROVIDER_SITE_OTHER): Payer: Medicare HMO | Admitting: Internal Medicine

## 2015-11-18 VITALS — BP 120/62 | HR 58 | Ht 68.0 in | Wt 183.0 lb

## 2015-11-18 DIAGNOSIS — I5043 Acute on chronic combined systolic (congestive) and diastolic (congestive) heart failure: Secondary | ICD-10-CM | POA: Diagnosis not present

## 2015-11-18 DIAGNOSIS — I25111 Atherosclerotic heart disease of native coronary artery with angina pectoris with documented spasm: Secondary | ICD-10-CM | POA: Diagnosis not present

## 2015-11-18 DIAGNOSIS — I481 Persistent atrial fibrillation: Secondary | ICD-10-CM | POA: Diagnosis not present

## 2015-11-18 DIAGNOSIS — I4819 Other persistent atrial fibrillation: Secondary | ICD-10-CM

## 2015-11-18 DIAGNOSIS — I10 Essential (primary) hypertension: Secondary | ICD-10-CM

## 2015-11-18 DIAGNOSIS — I255 Ischemic cardiomyopathy: Secondary | ICD-10-CM

## 2015-11-18 NOTE — Patient Instructions (Signed)
Medication Instructions: - Your physician recommends that you continue on your current medications as directed. Please refer to the Current Medication list given to you today.  Labwork: - Please take the lab order given to you today when you have your next lab draw at [facility].  Procedures/Testing: - none  Follow-Up: - Your physician recommends that you schedule a follow-up appointment in: 3 months with Dr. Rockey Situ  - Remote monitoring is used to monitor your Pacemaker of ICD from home. This monitoring reduces the number of office visits required to check your device to one time per year. It allows [place] to keep an eye on the functioning of your device to ensure it is working properly. You are scheduled for a device check from home on 02/17/16. You may send your transmission at any time that day. If you have a wireless device, the transmission will be sent automatically. After your physician reviews your transmission, you will receive a postcard with your next transmission date.  - Your physician wants you to follow-up in: 1 year with Dr. Caryl Comes. You will receive a reminder letter in the mail two months in advance. If you don't receive a letter, please call our office to schedule the follow-up appointment.  Any Additional Special Instructions Will Be Listed Below (If Applicable).     If you need a refill on your cardiac medications before your next appointment, please call your pharmacy.

## 2015-11-18 NOTE — Progress Notes (Signed)
ELECTROPHYSIOLOGY CONSULT NOTE  Patient ID: Caroline Hernandez, MRN: [id number], DOB/AGE: 68-Sep-1947 68 y.o. Admit date: (Not on file) Date of Consult: 11/18/2015  Primary Physician: Art Buff, MD Primary Cardiologist: TG Consulting Physician TG  Chief Complaint: ICD to establish   HPI Caroline Hernandez is a 70 y.o. female seen to establish ICD follow-up.  She has a history of ischemic heart disease with prior bypass surgery In 2004.    She has a history of a prior ICD for cardiac arrest;  this occurred prior to her bypass. She then had a 6949-lead implanted in the end up with lead failure treated with insertion of a new lead and the abandoning of the old lead. This was done 2012. She has had no interval ICD discharges.  She has a history of atrial fibrillation initially diagnosed 2016. She underwent cardioversion 1/17. 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 However, she now is on amiodarone tolerated it well and feeling much better. Chart reviewed demonstrates last LFTs 5/16 prior to initiation and there is no TSH on the chart     Echocardiogram 12/15 with an EF of 45-50% with severe pulmonary hypertension.    Past Medical History  Diagnosis Date  . Chronic combined systolic and diastolic CHF (congestive heart failure) ([place])     a. 12/2014 Echo: EF 45-50%;  b. 08/2015 Echo: EF 45-50%, ant, antsept HK, mildly dil LA, nl RV, mild-mod TR, sev PAH (20mmHg).  . Diabetes mellitus without complication ([place])   . Essential hypertension   . Amputation of right lower extremity below knee ([place])   . Amputation of left lower extremity below knee ([place])   . Persistent atrial fibrillation ([place])     a. Dx 12/2014.  CHA2DS2VASc = 6--> warfarinC  . GERD (gastroesophageal reflux disease)   . Hyperlipidemia   . CLL (chronic lymphocytic leukemia) ([place])   . Renal failure   . CAD (coronary artery disease)     a. 2004 Cardiac Arrest/CABG x 3 (LIMA->LAD, VG->OM, VG->RCA);  b.  06/2015 lexiscan MV: no significant ischemia, EF 48%, low risk->Med Rx.  . Ischemic cardiomyopathy     a. s/p MDT ICD (originally had [id number] lead-->gen change and lead revision ~ 2012 @ [place]); b. 12/2014 Echo: EF 45-50%;  c.08/2015 Echo: EF 45-50%, ant, antsept HK. Nl RV.  Marland Kitchen History of cardiac arrest     a. 2004.  Marland Kitchen Hiatal hernia   . CKD (chronic kidney disease), stage IV ([place])   . PAD (peripheral artery disease) ([place])     a. s/p BKA      Surgical History:  Past Surgical History  Procedure Laterality Date  . Abdominal hysterectomy    . Amputation lower extremity bilaterally Bilateral   . Coronary artery bypass graft    . Cardiac catheterization    . Electrophysiologic study N/A 10/07/2015    Procedure: CARDIOVERSION;  Surgeon: Minna Merritts, MD;  Location: [facility] ORS;  Service: Cardiovascular;  Laterality: N/A;  . Insert / replace / remove pacemaker  2005    Medtronic      Home Meds: Prior to Admission medications   Medication Sig Start Date End Date Taking? Authorizing Provider  amiodarone (PACERONE) 200 MG tablet Take 1 tablet (200 mg total) by mouth daily. 10/21/15  Yes Ryan M Dunn, PA-C  amitriptyline (ELAVIL) 25 MG tablet Take 25 mg by mouth every evening. 06/02/15  Yes Historical Provider, MD  amLODipine (NORVASC) 10 MG tablet Take 1 tablet (10  mg total) by mouth daily as needed. Patient taking differently: Take 10 mg by mouth daily.  07/20/15  Yes Ryan M Dunn, PA-C  atorvastatin (LIPITOR) 40 MG tablet Take 40 mg by mouth daily. 06/02/15  Yes Historical Provider, MD  carvedilol (COREG) 25 MG tablet Take 1 tablet (25 mg total) by mouth 2 (two) times daily. 10/21/15  Yes Ryan M Dunn, PA-C  Cholecalciferol (HM VITAMIN D3) 4000 UNITS CAPS Take 4,000 Units by mouth daily.   Yes Historical Provider, MD  furosemide (LASIX) 40 MG tablet Take 1 tablet (40 mg total) by mouth 2 (two) times daily. Patient taking differently: Take 40 mg by mouth 2 (two) times daily as needed.  06/18/15  Yes Minna Merritts, MD  HUMALOG 100 UNIT/ML injection Inject 10-14 Units into the skin See admin instructions. Inject 12 units subcutaneous in the morning with breakfast, 10 units subcutaneous at lunch, and 14 units subcutaneous at suppertime. 05/28/15  Yes Historical Provider, MD  isosorbide mononitrate (IMDUR) 30 MG 24 hr tablet Take 1 tablet (30 mg total) by mouth 2 (two) times daily. 06/18/15  Yes Minna Merritts, MD  LANTUS 100 UNIT/ML injection Inject 14-35 Units into the skin See admin instructions. Inject 35 units subcutaneous once a day in the morning. Inject 14 units subcutaneous once a day at bedtime. 05/31/15  Yes Historical Provider, MD  losartan (COZAAR) 100 MG tablet Take 100 mg by mouth daily. 06/02/15  Yes Historical Provider, MD  Multiple Vitamins-Minerals (OCUTABS-LUTEIN) TABS Take 1 tablet by mouth daily.   Yes Historical Provider, MD  nitroGLYCERIN (NITROSTAT) 0.4 MG SL tablet Place 1 tablet (0.4 mg total) under the tongue every 5 (five) minutes as needed for chest pain. 06/14/15  Yes Sital Mody, MD  pantoprazole (PROTONIX) 40 MG tablet Take 40 mg by mouth daily. 05/24/15  Yes Historical Provider, MD  POLY-IRON 150 150 MG capsule Take 150 mg by mouth daily. 05/24/15  Yes Historical Provider, MD  PREVIDENT 5000 DRY MOUTH 1.1 % GEL dental gel Take by mouth daily. Brush teeth with paste for at least 1 minute once a day. 03/25/15  Yes Historical Provider, MD  sertraline (ZOLOFT) 100 MG tablet Take 100 mg by mouth daily. 06/02/15  Yes Historical Provider, MD  vitamin C (ASCORBIC ACID) 500 MG tablet Take 500 mg by mouth daily.   Yes Historical Provider, MD  warfarin (COUMADIN) 4 MG tablet Take as directed by Coumadin Clinic 09/15/15  Yes Minna Merritts, MD    Allergies:  Allergies  Allergen Reactions  . Zosyn [Piperacillin Sod-Tazobactam So] Other (See Comments)    Renal failure    Social History   Social History  . Marital Status: Divorced    Spouse Name: N/A  . Number of Children: N/A  .  Years of Education: N/A   Occupational History  . Not on file.   Social History Main Topics  . Smoking status: Never Smoker   . Smokeless tobacco: Not on file  . Alcohol Use: No  . Drug Use: No  . Sexual Activity: Not on file   Other Topics Concern  . Not on file   Social History Narrative     Family History  Problem Relation Age of Onset  . CAD Mother   . Diabetes Mother   . Atrial fibrillation Mother   . Lung cancer Father   . Diabetes Father      ROS:  Please see the history of present illness.     All  other systems reviewed and negative.    Physical Exam:   Blood pressure 120/62, pulse 58, height 5\' 8"  (1.727 m), weight 183 lb (83.008 kg). General: Well developed, well nourished female in no acute distress. Head: Normocephalic, atraumatic, sclera non-icteric, no xanthomas, nares are without discharge. EENT: normal  Lymph Nodes:  none Neck: Negative for carotid bruits. JVD not elevated. Back:without scoliosis kyphosis  Device pocket well healed; without hematoma or erythema.  There is no tethering  Lungs: Clear bilaterally to auscultation without wheezes, rales, or rhonchi. Breathing is unlabored. Heart: RRR with S1 S2. No murmur . No rubs, or gallops appreciated. Abdomen: Soft, non-tender, non-distended with normoactive bowel sounds. No hepatomegaly. No rebound/guarding. No obvious abdominal masses. Msk:  Strength and tone appear normal for age. Extremities: No clubbing or cyanosis. Bilateral BKA    Skin: Warm and Dry Neuro: Alert and oriented X 3. CN III-XII intact Grossly normal sensory and motor function . Psych:  Responds to questions appropriately with a normal affect.      Labs: Cardiac Enzymes No results for input(s): CKTOTAL, CKMB, TROPONINI in the last 72 hours. CBC Lab Results  Component Value Date   WBC 23.1* 10/06/2015   HGB 13.4 10/06/2015   HCT 41.7 10/06/2015   MCV 84.7 10/06/2015   PLT 168 10/06/2015   PROTIME: No results for  input(s): LABPROT, INR in the last 72 hours. Chemistry No results for input(s): NA, K, CL, CO2, BUN, CREATININE, CALCIUM, PROT, BILITOT, ALKPHOS, ALT, AST, GLUCOSE in the last 168 hours.  Invalid input(s): LABALBU Lipids Lab Results  Component Value Date   CHOL 91 06/13/2015   HDL 25* 06/13/2015   LDLCALC 40 06/13/2015   TRIG 128 06/13/2015   BNP No results found for: PROBNP Thyroid Function Tests: No results for input(s): TSH, T4TOTAL, T3FREE, THYROIDAB in the last 72 hours.  Invalid input(s): FREET3 Miscellaneous No results found for: DDIMER  Radiology/Studies:  No results found.  EKG:  Sinus rhythm at 58 Intervals 36/13/46 Left axis deviation -58   Assessment and Plan:    Atrial fibrillation-persistent  Ischemic heart disease with prior bypass surgery   Implantable defibrillator-single chamber-Medtronic with an abandoned 6949-lead  First-degree AV block  CLL  Amiodarone therapy  The patient is doing much better in sinus rhythm. Continue her on her amiodarone at 200 mg a day. We will arrange fails laboratories today. We'll follow up with her primary cardiologist next 3 or 4 months. We will establish remote follow-up for ICD and I will see her again in one year's time.  There is some chance that the amiodarone as it accumulates will adversely affect antegrade conduction. She is not all that ambulatory hopefully that will not turn out to be a problem. She is aware when she ventricularly paces. Interestingly, she is pacing less now than she was prior to cardioversion.      Virl Axe

## 2015-11-19 LAB — HEPATIC FUNCTION PANEL (6)
ALT: 22 IU/L (ref 0–32)
AST: 16 IU/L (ref 0–40)
Albumin: 4.1 g/dL (ref 3.6–4.8)
Alkaline Phosphatase: 86 IU/L (ref 39–117)
BILIRUBIN TOTAL: 0.6 mg/dL (ref 0.0–1.2)
Bilirubin, Direct: 0.18 mg/dL (ref 0.00–0.40)

## 2015-11-19 LAB — TSH: TSH: 8.21 u[IU]/mL — AB (ref 0.450–4.500)

## 2015-11-26 LAB — CUP PACEART INCLINIC DEVICE CHECK
MDC IDC LEAD IMPLANT DT: 20120326
MDC IDC LEAD LOCATION: 753860
MDC IDC SESS DTM: 20170317103144

## 2015-12-08 ENCOUNTER — Ambulatory Visit (INDEPENDENT_AMBULATORY_CARE_PROVIDER_SITE_OTHER): Payer: Medicare HMO

## 2015-12-08 DIAGNOSIS — I481 Persistent atrial fibrillation: Secondary | ICD-10-CM | POA: Diagnosis not present

## 2015-12-08 DIAGNOSIS — Z7901 Long term (current) use of anticoagulants: Secondary | ICD-10-CM | POA: Diagnosis not present

## 2015-12-08 DIAGNOSIS — I4819 Other persistent atrial fibrillation: Secondary | ICD-10-CM

## 2015-12-08 LAB — POCT INR: INR: 4.3

## 2015-12-15 ENCOUNTER — Ambulatory Visit (INDEPENDENT_AMBULATORY_CARE_PROVIDER_SITE_OTHER): Payer: Medicare HMO

## 2015-12-15 DIAGNOSIS — I4819 Other persistent atrial fibrillation: Secondary | ICD-10-CM

## 2015-12-15 DIAGNOSIS — Z7901 Long term (current) use of anticoagulants: Secondary | ICD-10-CM

## 2015-12-15 DIAGNOSIS — I481 Persistent atrial fibrillation: Secondary | ICD-10-CM | POA: Diagnosis not present

## 2015-12-15 LAB — POCT INR: INR: 6.2

## 2015-12-29 ENCOUNTER — Ambulatory Visit (INDEPENDENT_AMBULATORY_CARE_PROVIDER_SITE_OTHER): Payer: Medicare HMO

## 2015-12-29 DIAGNOSIS — I4819 Other persistent atrial fibrillation: Secondary | ICD-10-CM

## 2015-12-29 DIAGNOSIS — Z7901 Long term (current) use of anticoagulants: Secondary | ICD-10-CM

## 2015-12-29 DIAGNOSIS — I481 Persistent atrial fibrillation: Secondary | ICD-10-CM | POA: Diagnosis not present

## 2015-12-29 LAB — POCT INR: INR: 3

## 2016-01-19 ENCOUNTER — Other Ambulatory Visit: Payer: Self-pay | Admitting: Cardiovascular Disease

## 2016-01-19 ENCOUNTER — Ambulatory Visit (INDEPENDENT_AMBULATORY_CARE_PROVIDER_SITE_OTHER): Payer: Medicare HMO

## 2016-01-19 DIAGNOSIS — I4819 Other persistent atrial fibrillation: Secondary | ICD-10-CM

## 2016-01-19 DIAGNOSIS — Z7901 Long term (current) use of anticoagulants: Secondary | ICD-10-CM | POA: Diagnosis not present

## 2016-01-19 DIAGNOSIS — I481 Persistent atrial fibrillation: Secondary | ICD-10-CM

## 2016-01-19 LAB — POCT INR: INR: 2.5

## 2016-01-19 MED ORDER — WARFARIN SODIUM 4 MG PO TABS: ORAL_TABLET | ORAL | Status: DC

## 2016-02-16 ENCOUNTER — Ambulatory Visit (INDEPENDENT_AMBULATORY_CARE_PROVIDER_SITE_OTHER): Payer: Medicare HMO

## 2016-02-16 DIAGNOSIS — Z7901 Long term (current) use of anticoagulants: Secondary | ICD-10-CM

## 2016-02-16 DIAGNOSIS — I481 Persistent atrial fibrillation: Secondary | ICD-10-CM

## 2016-02-16 DIAGNOSIS — I4819 Other persistent atrial fibrillation: Secondary | ICD-10-CM

## 2016-02-16 LAB — POCT INR: INR: 2.1

## 2016-02-17 ENCOUNTER — Encounter: Payer: Medicare HMO | Admitting: *Deleted

## 2016-02-18 ENCOUNTER — Encounter: Payer: Self-pay | Admitting: Cardiology

## 2016-02-22 ENCOUNTER — Ambulatory Visit (INDEPENDENT_AMBULATORY_CARE_PROVIDER_SITE_OTHER): Payer: Medicare HMO | Admitting: Cardiovascular Disease

## 2016-02-22 ENCOUNTER — Encounter: Payer: Self-pay | Admitting: Cardiovascular Disease

## 2016-02-22 VITALS — BP 120/72 | HR 50 | Ht 68.0 in | Wt 187.0 lb

## 2016-02-22 DIAGNOSIS — R0602 Shortness of breath: Secondary | ICD-10-CM | POA: Diagnosis not present

## 2016-02-22 DIAGNOSIS — I481 Persistent atrial fibrillation: Secondary | ICD-10-CM | POA: Diagnosis not present

## 2016-02-22 DIAGNOSIS — I25791 Atherosclerosis of other coronary artery bypass graft(s) with angina pectoris with documented spasm: Secondary | ICD-10-CM

## 2016-02-22 DIAGNOSIS — M7989 Other specified soft tissue disorders: Secondary | ICD-10-CM

## 2016-02-22 DIAGNOSIS — I4891 Unspecified atrial fibrillation: Secondary | ICD-10-CM | POA: Diagnosis not present

## 2016-02-22 DIAGNOSIS — I5043 Acute on chronic combined systolic (congestive) and diastolic (congestive) heart failure: Secondary | ICD-10-CM

## 2016-02-22 DIAGNOSIS — R001 Bradycardia, unspecified: Secondary | ICD-10-CM

## 2016-02-22 DIAGNOSIS — I10 Essential (primary) hypertension: Secondary | ICD-10-CM

## 2016-02-22 DIAGNOSIS — I25111 Atherosclerotic heart disease of native coronary artery with angina pectoris with documented spasm: Secondary | ICD-10-CM

## 2016-02-22 DIAGNOSIS — I25701 Atherosclerosis of coronary artery bypass graft(s), unspecified, with angina pectoris with documented spasm: Secondary | ICD-10-CM

## 2016-02-22 DIAGNOSIS — I4819 Other persistent atrial fibrillation: Secondary | ICD-10-CM

## 2016-02-22 MED ORDER — CARVEDILOL 3.125 MG PO TABS: 3.1250 mg | ORAL_TABLET | Freq: Two times a day (BID) | ORAL | Status: DC

## 2016-02-22 MED ORDER — AMIODARONE HCL 200 MG PO TABS: 200.0000 mg | ORAL_TABLET | Freq: Two times a day (BID) | ORAL | Status: DC

## 2016-02-22 NOTE — Patient Instructions (Addendum)
You are back in atrial fibrillation, rate 50 bpm  Please increase the amiodarone up to 200 mg twice a day  Please decrease the coreg down to 3.125 mg twice a day  INR check in 10 days  Please call us if you have new issues that need to be addressed before your next appt.  Your physician wants you to follow-up in: 3 weeks

## 2016-02-22 NOTE — Progress Notes (Signed)
Patient ID: Caroline Hernandez, female   DOB: 02-Oct-1945, 70 y.o.   MRN: [id number] Cardiology Office Note  Date:  02/22/2016   ID:  Caroline Hernandez, DOB 1946/06/16, MRN [id number]  PCP:  Art Buff, MD   Chief Complaint  Patient presents with  . other    3 month follow up. Meds reviewed by the patient verbally. Pt. c/o shortness of breath with A-Fib.     HPI:  70 y.o. female with coronary artery disease, h/o bypass surgery in 2004, CLL probably for the past 2-3 years, diabetes, below the knee amputation for PAD, atrial fibrillation starting possibly in April 2016, noted again June 2016, now persistent, not on anticoagulation at this time, acute on chronic diastolic CHF initially April 2016, maintain on Lasix daily,  admitted to the hospital with diastolic CHF/leg swelling, abdominal bloating, angina, who presents For routine follow-up of her atrial fibrillation She has bilateral prosthesis  She underwent cardioversion at the end of January 2017 for persistent atrial fibrillation Maintaining normal sinus rhythm early February (amiodarone decreased down to 100 mg at that time), and in March when seen by Dr. Caryl Comes Reports having some shortness of breath, worsening leg swelling over the past month or 2 Weight has increased from low 180 pounds up to 191 pounds at which time she increased Lasix up to 40 mg twice a day Mild improvement in her weight, down 4 pounds Still with significant fatigue, leg swelling, shortness of breath on exertion  Recent lab work showing hemoglobin A1c 6.7, creatinine 2.4  EKG on today's visit shows atrial fibrillation with ventricular rate 50 bpm, left anterior fascicular block  Other past medical history History of coronary disease with cardiac arrest in 2004, taken for three-vessel CABG, LIMA to the LAD, vein graft to the RCA, vein graft to an OM. No stenting or intervention since that time.  She reports having chronic infections of her legs leading to  amputation. In hindsight family wonders if this could've been exacerbated by CLL. Recently seen by oncologist, cleared to start Coumadin for atrial fibrillation. She has not had follow-up with cardiology at [facility] to start the warfarin.  She reports having problems in the past with anemia. Started on iron supplementation with improvement  PMH:   has a past medical history of Chronic combined systolic and diastolic CHF (congestive heart failure) ([place]); Diabetes mellitus without complication ([place]); Essential hypertension; Amputation of right lower extremity below knee ([place]); Amputation of left lower extremity below knee ([place]); Persistent atrial fibrillation ([place]); GERD (gastroesophageal reflux disease); Hyperlipidemia; CLL (chronic lymphocytic leukemia) ([place]); Renal failure; CAD (coronary artery disease); Ischemic cardiomyopathy; History of cardiac arrest; Hiatal hernia; CKD (chronic kidney disease), stage IV ([place]); and PAD (peripheral artery disease) ([place]).  PSH:    Past Surgical History  Procedure Laterality Date  . Abdominal hysterectomy    . Amputation lower extremity bilaterally Bilateral   . Coronary artery bypass graft    . Cardiac catheterization    . Electrophysiologic study N/A 10/07/2015    Procedure: CARDIOVERSION;  Surgeon: Minna Merritts, MD;  Location: [facility] ORS;  Service: Cardiovascular;  Laterality: N/A;  . Insert / replace / remove pacemaker  2005    Medtronic     Current Outpatient Prescriptions  Medication Sig Dispense Refill  . amiodarone (PACERONE) 200 MG tablet Take 1 tablet (200 mg total) by mouth 2 (two) times daily. 60 tablet 6  . amitriptyline (ELAVIL) 25 MG tablet Take 25 mg by mouth every evening.  11  .  amLODipine (NORVASC) 10 MG tablet Take 1 tablet (10 mg total) by mouth daily as needed. (Patient taking differently: Take 10 mg by mouth daily. ) 90 tablet 3  . atorvastatin (LIPITOR) 40 MG tablet Take 40 mg by mouth daily.  11  . carvedilol (COREG) 3.125 MG  tablet Take 1 tablet (3.125 mg total) by mouth 2 (two) times daily. 60 tablet 6  . Cholecalciferol (HM VITAMIN D3) 4000 UNITS CAPS Take 4,000 Units by mouth daily.    . furosemide (LASIX) 40 MG tablet Take 1 tablet (40 mg total) by mouth 2 (two) times daily. (Patient taking differently: Take 40 mg by mouth 2 (two) times daily as needed. ) 60 tablet 11  . HUMALOG 100 UNIT/ML injection Inject 10-14 Units into the skin See admin instructions. Inject 12 units subcutaneous in the morning with breakfast, 10 units subcutaneous at lunch, and 14 units subcutaneous at suppertime.  6  . isosorbide mononitrate (IMDUR) 30 MG 24 hr tablet Take 1 tablet (30 mg total) by mouth 2 (two) times daily. 60 tablet 11  . LANTUS 100 UNIT/ML injection Inject 14-35 Units into the skin See admin instructions. Inject 35 units subcutaneous once a day in the morning. Inject 14 units subcutaneous once a day at bedtime.  0  . losartan (COZAAR) 100 MG tablet Take 100 mg by mouth daily.  11  . Multiple Vitamins-Minerals (OCUTABS-LUTEIN) TABS Take 1 tablet by mouth daily.    . nitroGLYCERIN (NITROSTAT) 0.4 MG SL tablet Place 1 tablet (0.4 mg total) under the tongue every 5 (five) minutes as needed for chest pain. 30 tablet 0  . pantoprazole (PROTONIX) 40 MG tablet Take 40 mg by mouth daily.  0  . POLY-IRON 150 150 MG capsule Take 150 mg by mouth daily.  3  . potassium chloride SA (K-DUR,KLOR-CON) 20 MEQ tablet Take 20 mEq by mouth daily.    Marland Kitchen PREVIDENT 5000 DRY MOUTH 1.1 % GEL dental gel Take by mouth daily. Brush teeth with paste for at least 1 minute once a day.  5  . sertraline (ZOLOFT) 100 MG tablet Take 100 mg by mouth daily.  11  . vitamin C (ASCORBIC ACID) 500 MG tablet Take 500 mg by mouth daily.    Marland Kitchen warfarin (COUMADIN) 4 MG tablet Take as directed by [facility] 50 tablet 3   No current facility-administered medications for this visit.     Allergies:   Zosyn   Social History:  The patient  reports that she has  never smoked. She does not have any smokeless tobacco history on file. She reports that she does not drink alcohol or use illicit drugs.   Family History:   family history includes Atrial fibrillation in her mother; CAD in her mother; Diabetes in her father and mother; Lung cancer in her father.    Review of Systems: Review of Systems  Constitutional: Negative.        Weight gain  Respiratory: Positive for shortness of breath.   Cardiovascular: Positive for leg swelling.  Gastrointestinal: Negative.   Musculoskeletal: Negative.   Neurological: Negative.   Psychiatric/Behavioral: Negative.   All other systems reviewed and are negative.    PHYSICAL EXAM: VS:  BP 120/72 mmHg  Pulse 50  Ht 5\' 8"  (1.727 m)  Wt 187 lb (84.823 kg)  BMI 28.44 kg/m2 , BMI Body mass index is 28.44 kg/(m^2). GEN: Well nourished, well developed, in no acute distress, obese HEENT: normal Neck: no JVD, carotid bruits, or masses Cardiac:  IRRR; bradycardic, no murmurs, rubs, or gallops,no edema  Respiratory:  clear to auscultation bilaterally, normal work of breathing GI: soft, nontender, nondistended, + BS MS: no deformity or atrophy, Bilateral prosthesis Skin: warm and dry, no rash Neuro:  Strength and sensation are intact Psych: euthymic mood, full affect    Recent Labs: 07/20/2015: BNP 333.5* 10/06/2015: BUN 33*; Creatinine, Ser 1.82*; Hemoglobin 13.4; Platelets 168; Potassium 4.2; Sodium 140 11/18/2015: ALT 22; TSH 8.210*    Lipid Panel Lab Results  Component Value Date   CHOL 91 06/13/2015   HDL 25* 06/13/2015   LDLCALC 40 06/13/2015   TRIG 128 06/13/2015      Wt Readings from Last 3 Encounters:  02/22/16 187 lb (84.823 kg)  11/18/15 183 lb (83.008 kg)  10/21/15 183 lb (83.008 kg)       ASSESSMENT AND PLAN:   SOB (shortness of breath) -  Likely secondary to recurrence of her atrial fibrillation, causing acute on chronic diastolic and systolic CHF Recommended that she stay on her  current regimen of Lasix given recent 4 pound weight loss, Lasix 40 mill grams twice a day  Persistent atrial fibrillation ([place]) -  Long discussion today concerning restoring normal sinus rhythm given her recurrence of her shortness of breath, leg swelling. Recommended she decrease carvedilol down to 3.125 mill grams twice a day given her bradycardia We'll increase amiodarone up to 200 mg twice a day in an effort to restore normal sinus rhythm Reevaluate in 2-3 weeks time If she continues to have atrial fibrillation, we will reschedule cardioversion  Leg swelling Secondary to acute on chronic diastolic and systolic CHF, exacerbated by recurrence of her atrial fibrillation We'll continue Lasix as above  Coronary artery disease involving native coronary artery of native heart with angina pectoris with documented spasm ([place]) Currently with no symptoms of angina. No further workup at this time. Continue current medication regimen.  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 Currently with no symptoms of angina  Systolic and diastolic CHF, acute on chronic (HCC) Recurrence of her CHF exacerbated by atrial fibrillation  Essential hypertension Blood pressure is well controlled on today's visit.  Given her bradycardia, medication changes as above  Bradycardia Would decrease her carvedilol Increase amiodarone in an effort to restore normal sinus rhythm   Total encounter time more than 25 minutes  Greater than 50% was spent in counseling and coordination of care with the patient   Disposition:   F/U  6 months   Orders Placed This Encounter  Procedures  . EKG 12-Lead     Signed, Esmond Plants, M.D., Ph.D. 02/22/2016  [place], [place]

## 2016-03-08 ENCOUNTER — Ambulatory Visit (INDEPENDENT_AMBULATORY_CARE_PROVIDER_SITE_OTHER): Payer: Medicare HMO | Admitting: *Deleted

## 2016-03-08 DIAGNOSIS — Z7901 Long term (current) use of anticoagulants: Secondary | ICD-10-CM | POA: Diagnosis not present

## 2016-03-08 DIAGNOSIS — I4819 Other persistent atrial fibrillation: Secondary | ICD-10-CM

## 2016-03-08 DIAGNOSIS — I481 Persistent atrial fibrillation: Secondary | ICD-10-CM | POA: Diagnosis not present

## 2016-03-08 LAB — POCT INR: INR: 1.9

## 2016-03-16 ENCOUNTER — Other Ambulatory Visit
Admission: RE | Admit: 2016-03-16 | Discharge: 2016-03-16 | Disposition: A | Discharge status: Home / Self Care | Payer: Medicare HMO | Source: Ambulatory Visit | Attending: Nurse Practitioner | Admitting: Nurse Practitioner

## 2016-03-16 ENCOUNTER — Ambulatory Visit (INDEPENDENT_AMBULATORY_CARE_PROVIDER_SITE_OTHER): Payer: Medicare HMO | Admitting: Nurse Practitioner

## 2016-03-16 ENCOUNTER — Encounter: Payer: Self-pay | Admitting: Nurse Practitioner

## 2016-03-16 VITALS — BP 140/80 | HR 59 | Ht 68.0 in | Wt 187.0 lb

## 2016-03-16 DIAGNOSIS — I5042 Chronic combined systolic (congestive) and diastolic (congestive) heart failure: Secondary | ICD-10-CM

## 2016-03-16 DIAGNOSIS — I25118 Atherosclerotic heart disease of native coronary artery with other forms of angina pectoris: Secondary | ICD-10-CM | POA: Diagnosis not present

## 2016-03-16 DIAGNOSIS — I481 Persistent atrial fibrillation: Secondary | ICD-10-CM

## 2016-03-16 DIAGNOSIS — I110 Hypertensive heart disease with heart failure: Secondary | ICD-10-CM | POA: Diagnosis not present

## 2016-03-16 DIAGNOSIS — N184 Chronic kidney disease, stage 4 (severe): Secondary | ICD-10-CM

## 2016-03-16 DIAGNOSIS — I4819 Other persistent atrial fibrillation: Secondary | ICD-10-CM

## 2016-03-16 LAB — BASIC METABOLIC PANEL
ANION GAP: 9 (ref 5–15)
BUN: 38 mg/dL — ABNORMAL HIGH (ref 6–20)
CALCIUM: 9.3 mg/dL (ref 8.9–10.3)
CO2: 25 mmol/L (ref 22–32)
Chloride: 102 mmol/L (ref 101–111)
Creatinine, Ser: 2.27 mg/dL — ABNORMAL HIGH (ref 0.44–1.00)
GFR, EST AFRICAN AMERICAN: 24 mL/min — AB (ref >60)
GFR, EST NON AFRICAN AMERICAN: 21 mL/min — AB (ref >60)
Glucose, Bld: 113 mg/dL — ABNORMAL HIGH (ref 65–99)
POTASSIUM: 4.1 mmol/L (ref 3.5–5.1)
SODIUM: 136 mmol/L (ref 135–145)

## 2016-03-16 LAB — PROTIME-INR
INR: 2.24
PROTHROMBIN TIME: 24.6 s — AB (ref 11.4–15.0)

## 2016-03-16 NOTE — Progress Notes (Signed)
Office Visit    Patient Name: Caroline Hernandez Date of Encounter: 03/16/2016  Primary Care Provider:  Art Buff, MD Primary Cardiologist:  Johnny Bridge, MD / S. Caryl Comes, MD   Chief Complaint    70 y/o ? with a h/o CAD, HTN, HL, ICM, chronic combined CHF, PAD s/p BKA, CKD IV, and Afib s/p DCCV 09/2015, who presents for f/u after recent visit @ which time recurrent AF was noted.  Past Medical History    Past Medical History  Diagnosis Date  . Chronic combined systolic and diastolic CHF (congestive heart failure) ([place])     a. 12/2014 Echo: EF 45-50%;  b. 08/2015 Echo: EF 45-50%, ant, antsept HK, mildly dil LA, nl RV, mild-mod TR, sev PAH (69mmHg).  . Diabetes mellitus without complication ([place])   . Essential hypertension   . Amputation of right lower extremity below knee ([place])   . Amputation of left lower extremity below knee ([place])   . Persistent atrial fibrillation ([place])     a. Dx 12/2014.  CHA2DS2VASc = 6--> warfarin;  b. 09/2015 s/p DCCV-->on amio.  Marland Kitchen GERD (gastroesophageal reflux disease)   . Hyperlipidemia   . CLL (chronic lymphocytic leukemia) ([place])   . CAD (coronary artery disease)     a. 2004 Cardiac Arrest/CABG x 3 (LIMA->LAD, VG->OM, VG->RCA);  b. 06/2015 lexiscan MV: no significant ischemia, EF 48%, low risk->Med Rx.  . Ischemic cardiomyopathy     a. s/p MDT ICD (originally had [id number] lead-->gen change and lead revision ~ 2012 @ [facility]); b. 12/2014 Echo: EF 45-50%;  c.08/2015 Echo: EF 45-50%, ant, antsept HK. Nl RV.  Marland Kitchen History of cardiac arrest     a. 2004.  Marland Kitchen Hiatal hernia   . CKD (chronic kidney disease), stage IV ([place])   . PAD (peripheral artery disease) ([place])     a. s/p BKA   Past Surgical History  Procedure Laterality Date  . Abdominal hysterectomy    . Amputation lower extremity bilaterally Bilateral   . Coronary artery bypass graft    . Cardiac catheterization    . Electrophysiologic study N/A 10/07/2015    Procedure: CARDIOVERSION;  Surgeon: Minna Merritts, MD;  Location: [facility] ORS;  Service: Cardiovascular;  Laterality: N/A;  . Insert / replace / remove pacemaker  2005    Medtronic     Allergies  Allergies  Allergen Reactions  . Zosyn [Piperacillin Sod-Tazobactam So] Other (See Comments)    Renal failure    History of Present Illness    70 y/o ? with the above complex past medical history. She is status post cardiac arrest and coronary artery bypass grafting 3 in 2004. This was complicated by ischemic cardiomyopathy and systolic heart failure and she subsequently underwent ICD placement in 2005. In the spring of 2016, she was diagnosed with atrial fibrillation.  This was rate controlled and she was placed on warfarin.  In October 2016, she was admitted to [facility] with complaints of chest pain. Stress testing was negative. She followed up in November and then again in December and expressed interest in cardioversion. Of note, echocardiogram in December showed an EF of 45-50% with mildly dilated left atrium. She also was noted to have severe pulmonary hypertension. She was subsequently placed on amiodarone and underwent cardioversion in January 2017.  She did well and was maintaining sinus rhythm. In April, she was noted to have some irregularity to her heart   rhythm at an endocrinology appointment. She thinks she has  been in A. fib since then. In that setting, she was having some increasing dyspnea on exertion and also lower extremity swelling, affecting her thighs (bilateral BKA's). She saw Dr. Rockey Situ in mid June and was in atrial fibrillation. She was bradycardic. Her carvedilol dose was reduced and amiodarone was increased to 200 mg twice a day. Arrangement for follow-up was made and it was felt that if she remained in A. fib at follow-up, she would require repeat cardioversion. Since that visit, she is been taking Lasix 40 mg twice a day. With that, she has had improvement in edema. Further, on the lower dose of beta blocker, her  resting heart rates have been in the 50s and she has had less fatigue. She continues to have dyspnea on exertion and remains interested in repeat cardioversion. Her last INR was performed on June 28, and was subtherapeutic at 1.9.  Home Medications    Prior to Admission medications   Medication Sig Start Date End Date Taking? Authorizing Provider  amiodarone (PACERONE) 200 MG tablet Take 1 tablet (200 mg total) by mouth 2 (two) times daily. 02/22/16  Yes Minna Merritts, MD  amitriptyline (ELAVIL) 25 MG tablet Take 25 mg by mouth every evening. 06/02/15  Yes Historical Provider, MD  amLODipine (NORVASC) 10 MG tablet Take 1 tablet (10 mg total) by mouth daily as needed. Patient taking differently: Take 10 mg by mouth daily.  07/20/15  Yes Ryan M Dunn, PA-C  atorvastatin (LIPITOR) 40 MG tablet Take 40 mg by mouth daily. 06/02/15  Yes Historical Provider, MD  carvedilol (COREG) 3.125 MG tablet Take 1 tablet (3.125 mg total) by mouth 2 (two) times daily. 02/22/16  Yes Minna Merritts, MD  Cholecalciferol (HM VITAMIN D3) 4000 UNITS CAPS Take 4,000 Units by mouth daily.   Yes Historical Provider, MD  furosemide (LASIX) 40 MG tablet Take 1 tablet (40 mg total) by mouth 2 (two) times daily. Patient taking differently: Take 40 mg by mouth 2 (two) times daily as needed.  06/18/15  Yes Minna Merritts, MD  HUMALOG 100 UNIT/ML injection Inject 10-14 Units into the skin See admin instructions. Inject 12 units subcutaneous in the morning with breakfast, 10 units subcutaneous at lunch, and 14 units subcutaneous at suppertime. 05/28/15  Yes Historical Provider, MD  isosorbide mononitrate (IMDUR) 30 MG 24 hr tablet Take 1 tablet (30 mg total) by mouth 2 (two) times daily. 06/18/15  Yes Minna Merritts, MD  LANTUS 100 UNIT/ML injection Inject 14-35 Units into the skin See admin instructions. Inject 35 units subcutaneous once a day in the morning. Inject 14 units subcutaneous once a day at bedtime. 05/31/15  Yes Historical  Provider, MD  losartan (COZAAR) 100 MG tablet Take 100 mg by mouth daily. 06/02/15  Yes Historical Provider, MD  Multiple Vitamins-Minerals (OCUTABS-LUTEIN) TABS Take 1 tablet by mouth daily.   Yes Historical Provider, MD  nitroGLYCERIN (NITROSTAT) 0.4 MG SL tablet Place 1 tablet (0.4 mg total) under the tongue every 5 (five) minutes as needed for chest pain. 06/14/15  Yes Sital Mody, MD  pantoprazole (PROTONIX) 40 MG tablet Take 40 mg by mouth daily. 05/24/15  Yes Historical Provider, MD  POLY-IRON 150 150 MG capsule Take 150 mg by mouth daily. 05/24/15  Yes Historical Provider, MD  potassium chloride SA (K-DUR,KLOR-CON) 20 MEQ tablet Take 20 mEq by mouth daily.   Yes Historical Provider, MD  PREVIDENT 5000 DRY MOUTH 1.1 % GEL dental gel Take by mouth daily. Brush teeth with  paste for at least 1 minute once a day. 03/25/15  Yes Historical Provider, MD  sertraline (ZOLOFT) 100 MG tablet Take 100 mg by mouth daily. 06/02/15  Yes Historical Provider, MD  vitamin C (ASCORBIC ACID) 500 MG tablet Take 500 mg by mouth daily.   Yes Historical Provider, MD  warfarin (COUMADIN) 4 MG tablet Take as directed by [facility] 01/19/16  Yes Minna Merritts, MD    Review of Systems    As above, she does express dyspnea on exertion. Her lower extremity swelling has been better on the higher dose of Lasix.  She denies chest pain, PND, orthopnea, dizziness, syncope, or early satiety.  All other systems reviewed and are otherwise negative except as noted above.  Physical Exam    VS:  Ht 5\' 8"  (1.727 m)  Wt 187 lb (84.823 kg)  BMI 28.44 kg/m2 , BMI Body mass index is 28.44 kg/(m^2). GEN: Well nourished, well developed, in no acute distress. HEENT: normal. Neck: Supple, no JVD, carotid bruits, or masses. Cardiac:  Irregularly irregular no murmurs, rubs, or gallops.  Bilateral BKA with prostheses. No edema above the knees bilaterally. Radials 2+ and equal bilaterally.  Respiratory:  Respirations regular and  unlabored, clear to auscultation bilaterally. GI: Soft, nontender, nondistended, BS + x 4. MS: no deformity or atrophy. Skin: warm and dry, no rash. Neuro:  Strength and sensation are intact. Psych: Normal affect.  Accessory Clinical Findings    ECG - AF, 59, LAD, IVCD, lateral st/t changes -no acute changes.  Assessment & Plan    1.  Persistent atrial fibrillation: Patient believes she went back into atrial fibrillation in April and in that setting, has had worsening dyspnea on exertion and also lower extremity swelling. When she was last seen by Dr. Rockey Situ, her blocker dose was reduced, amiodarone dose increased to 200 twice a day, and Lasix increased to 40 twice a day. With those changes, she has noticed improved energy and also reduced dyspnea and edema. She is interested in pursuing repeat cardioversion on the higher dose of amiodarone. Unfortunately, her last INR on June 28 was subtherapeutic at 1.9. She is not sure if she would be willing to wait for 3 weeks for therapeutic INR.  She does have CKD and thus I will check a repeat BMET and INR today.  If creat is up on the higher dose of lasix, we will have to come down on her lasix dose, @ which point, she will likely be more symptomatic and thus require TEE/DCCV sooner rather than later.  If creat stable, we could continue the current lasix dose and provided that symptoms remain stable, we could follow weekly INR's and plan dccv in 3-4 wks.  She will let us know her preference when we call her with lab results, as she'd like to talk it over with her daughter.  2.  Chronic combined systolic and diastolic CHF/ICM:  Volume improved on the the higher dose of lasix.  F/u bmet today.  Cont  blocker and ARB - though ARB may need to be d/c'd if creat rising.  3.  CAD:  No chest pain on nitrate therapy.  Cont statin and  blocker.  No asa 2/2 ongoing warfarin anticoagulation.  4.  CKD III:  On ARB.  F/u BMET.  Followed by nephrology @ [phone].  5.   Hypertensive Heart Disease with CHF:  BP relatively stable.  6.  Dispo:  F/u labs today.  Will put in for a 4 wk  f/u but may need to schedule tee/dccv sooner pending labs and her decision.  Murray Hodgkins, NP 03/16/2016, 1:49 PM

## 2016-03-16 NOTE — Patient Instructions (Signed)
Medication Instructions:  Please continue your current medications  Labwork: Please take orders to the [facility] for BMET & INR  Testing/Procedures: None  Follow-Up: 4 weeks w/ Ignacia Bayley, NP or Dr. Rockey Situ  Date & time: ______________________  If you need a refill on your cardiac medications before your next appointment, please call your pharmacy.

## 2016-03-22 ENCOUNTER — Ambulatory Visit (INDEPENDENT_AMBULATORY_CARE_PROVIDER_SITE_OTHER): Payer: Medicare HMO | Admitting: *Deleted

## 2016-03-22 DIAGNOSIS — Z7901 Long term (current) use of anticoagulants: Secondary | ICD-10-CM

## 2016-03-22 DIAGNOSIS — I4819 Other persistent atrial fibrillation: Secondary | ICD-10-CM

## 2016-03-22 DIAGNOSIS — I481 Persistent atrial fibrillation: Secondary | ICD-10-CM

## 2016-03-22 LAB — POCT INR: INR: 2.3

## 2016-03-29 ENCOUNTER — Ambulatory Visit (INDEPENDENT_AMBULATORY_CARE_PROVIDER_SITE_OTHER): Payer: Medicare HMO

## 2016-03-29 ENCOUNTER — Telehealth: Payer: Self-pay

## 2016-03-29 DIAGNOSIS — I481 Persistent atrial fibrillation: Secondary | ICD-10-CM | POA: Diagnosis not present

## 2016-03-29 DIAGNOSIS — I4819 Other persistent atrial fibrillation: Secondary | ICD-10-CM

## 2016-03-29 DIAGNOSIS — Z7901 Long term (current) use of anticoagulants: Secondary | ICD-10-CM

## 2016-03-29 LAB — POCT INR: INR: 2.5

## 2016-03-29 NOTE — Telephone Encounter (Signed)
Returned call to pt's daughter [name], [place] TCB.  Pt is scheduled to return next week on 04/05/16 at 9:15am for weekly INR checks, since pt is pending DCCV.  Would like to check pt here weekly if at all possible so we have record of checks for upcoming DCCV.  If pt needs to r/s time for INR check in 2 weeks on 04/12/16 at 9:15am secondary to having PCP appt at 8am, we can r/s appt to afternoon.  We can do this at pt's next appt on 04/05/16 or she can call back and we can r/s 8/2 appt to later time.

## 2016-03-29 NOTE — Telephone Encounter (Signed)
Patient says she has a pcp appt in [place] 8/2 @ 8 am and will not be able to make scheduled coumadin appt next week at 915.  Can we send order for pcp to check blood or should she r/s to lunch/afternoon?

## 2016-04-05 ENCOUNTER — Ambulatory Visit (INDEPENDENT_AMBULATORY_CARE_PROVIDER_SITE_OTHER): Payer: Medicare HMO

## 2016-04-05 DIAGNOSIS — Z7901 Long term (current) use of anticoagulants: Secondary | ICD-10-CM

## 2016-04-05 DIAGNOSIS — I481 Persistent atrial fibrillation: Secondary | ICD-10-CM | POA: Diagnosis not present

## 2016-04-05 DIAGNOSIS — I4819 Other persistent atrial fibrillation: Secondary | ICD-10-CM

## 2016-04-05 LAB — POCT INR: INR: 2.1

## 2016-04-12 ENCOUNTER — Ambulatory Visit (INDEPENDENT_AMBULATORY_CARE_PROVIDER_SITE_OTHER): Payer: Medicare HMO

## 2016-04-12 DIAGNOSIS — I4819 Other persistent atrial fibrillation: Secondary | ICD-10-CM

## 2016-04-12 DIAGNOSIS — I481 Persistent atrial fibrillation: Secondary | ICD-10-CM | POA: Diagnosis not present

## 2016-04-12 DIAGNOSIS — Z7901 Long term (current) use of anticoagulants: Secondary | ICD-10-CM

## 2016-04-12 LAB — POCT INR: INR: 2.6

## 2016-04-18 ENCOUNTER — Ambulatory Visit: Payer: Medicare HMO | Admitting: Physician Assistant

## 2016-04-18 NOTE — Progress Notes (Signed)
Cardiology Office Note Date:  04/19/2016  Patient ID:  Krisalyn Marchetta, DOB 1946/03/23, MRN [id number] PCP:  Art Buff, MD  Cardiologist:  Dr. Rockey Situ, MD    Chief Complaint: Follow up Afib  History of Present Illness: Jasvinder Hinnenkamp is a 70 y.o. female with history of CAD as below, ICM/chronic combined CHF, persistent Afib s/p DCCV 09/2015, PAD s/p BKA, CKD stage IV, HTN, and HLD who presents for follow up of Afib.   She is status post cardiac arrest and coronary artery bypass grafting 3 in 2004. This was complicated by ischemic cardiomyopathy and systolic heart failure and she subsequently underwent ICD placement in 2005. In the spring of 2016, she was diagnosed with atrial fibrillation. This was rate controlled and she was placed on warfarin. In October 2016, she was admitted to [facility] with complaints of chest pain. Stress testing was negative. She followed up in November and then again in December and expressed interest in cardioversion. Of note, echocardiogram in December showed an EF of 45-50% with mildly dilated left atrium. She also was noted to have severe pulmonary hypertension. She was subsequently placed on amiodarone and underwent cardioversion in January 2017. She did well and was maintaining sinus rhythm. In April, she was noted to have some irregularity to her heart rhythm at an endocrinology appointment. She thought she had been in Afib since that time at her office visit in July 2107. In that setting, she was having some increasing dyspnea on exertion and also lower extremity swelling, affecting her thighs (bilateral BKA's). She saw Dr. Rockey Situ in mid June and was in atrial fibrillation. She was bradycardic. Her carvedilol dose was reduced and amiodarone was increased to 200 mg twice a day. Arrangement for follow-up was made and it was felt that if she remained in Afib at follow-up, she would require repeat cardioversion. In July, she had been taking Lasix 40 mg  twice a day. With that, she noted some improvement in edema. Further, on the lower dose of beta blocker, her resting heart rates had been in the 50s and she noted less fatigue. She continued to have dyspnea on exertion and remained interested in repeat cardioversion. She was noted to have an INR on June 28 that was subtherapeutic at 1.9, thus she was required to wait 3-4 weeks and have weekly INR checks to establish appropriate anticoagulation as she did not wish to undergo TEE/DCCV. Since she was seen in July her INR has been therapeutic since 03/16/16 with the most recent level on 8/2 being 2.6.   She continues to note palpitations, and generalized weakness which is characteristic with her Afib. She has not missed any doses of her medications. Weight is stable and she notes much improved LE swelling along the bilateral thighs. No chest pain or SOB. She would like to proceed with DCCV now that her INR has been therapeutic x 4 weeks.    Past Medical History:  Diagnosis Date  . Amputation of left lower extremity below knee ([place])   . Amputation of right lower extremity below knee ([place])   . CAD (coronary artery disease)    a. 2004 Cardiac Arrest/CABG x 3 (LIMA->LAD, VG->OM, VG->RCA);  b. 06/2015 lexiscan MV: no significant ischemia, EF 48%, low risk->Med Rx.  . Chronic combined systolic and diastolic CHF (congestive heart failure) ([place])    a. 12/2014 Echo: EF 45-50%;  b. 08/2015 Echo: EF 45-50%, ant, antsept HK, mildly dil LA, nl RV, mild-mod TR, sev PAH (29mmHg).  Marland Kitchen  CKD (chronic kidney disease), stage IV ([place])   . CLL (chronic lymphocytic leukemia) ([place])   . Diabetes mellitus without complication ([place])   . Essential hypertension   . GERD (gastroesophageal reflux disease)   . Hiatal hernia   . History of cardiac arrest    a. 2004.  Marland Kitchen Hyperlipidemia   . Ischemic cardiomyopathy    a. s/p MDT ICD (originally had [id number] lead-->gen change and lead revision ~ 2012 @ [place]); b. 12/2014 Echo: EF 45-50%;   c.08/2015 Echo: EF 45-50%, ant, antsept HK. Nl RV.  Marland Kitchen PAD (peripheral artery disease) ([facility])    a. s/p BKA  . Persistent atrial fibrillation ([place])    a. Dx 12/2014.  CHA2DS2VASc = 6--> warfarin;  b. 09/2015 s/p DCCV-->on amio.    Past Surgical History:  Procedure Laterality Date  . ABDOMINAL HYSTERECTOMY    . Amputation lower extremity bilaterally Bilateral   . CARDIAC CATHETERIZATION    . CORONARY ARTERY BYPASS GRAFT    . ELECTROPHYSIOLOGIC STUDY N/A 10/07/2015   Procedure: CARDIOVERSION;  Surgeon: Minna Merritts, MD;  Location: [facility] ORS;  Service: Cardiovascular;  Laterality: N/A;  . INSERT / REPLACE / REMOVE PACEMAKER  2005   [employer]     Current Outpatient Prescriptions  Medication Sig Dispense Refill  . amiodarone (PACERONE) 200 MG tablet Take 1 tablet (200 mg total) by mouth 2 (two) times daily. 60 tablet 6  . amitriptyline (ELAVIL) 25 MG tablet Take 25 mg by mouth every evening.  11  . amLODipine (NORVASC) 10 MG tablet Take 1 tablet (10 mg total) by mouth daily as needed. (Patient taking differently: Take 10 mg by mouth daily. ) 90 tablet 3  . atorvastatin (LIPITOR) 40 MG tablet Take 40 mg by mouth daily.  11  . carvedilol (COREG) 3.125 MG tablet Take 1 tablet (3.125 mg total) by mouth 2 (two) times daily. 60 tablet 6  . Cholecalciferol (HM VITAMIN D3) 4000 UNITS CAPS Take 4,000 Units by mouth daily.    . furosemide (LASIX) 40 MG tablet Take 1 tablet (40 mg total) by mouth 2 (two) times daily. (Patient taking differently: Take 40 mg by mouth 2 (two) times daily as needed. ) 60 tablet 11  . HUMALOG 100 UNIT/ML injection Inject 10-14 Units into the skin See admin instructions. Inject 12 units subcutaneous in the morning with breakfast, 10 units subcutaneous at lunch, and 14 units subcutaneous at suppertime.  6  . isosorbide mononitrate (IMDUR) 30 MG 24 hr tablet Take 1 tablet (30 mg total) by mouth 2 (two) times daily. 60 tablet 11  . LANTUS 100 UNIT/ML injection Inject 14-35 Units  into the skin See admin instructions. Inject 35 units subcutaneous once a day in the morning. Inject 14 units subcutaneous once a day at bedtime.  0  . losartan (COZAAR) 100 MG tablet Take 100 mg by mouth daily.  11  . Multiple Vitamins-Minerals (OCUTABS-LUTEIN) TABS Take 1 tablet by mouth daily.    . nitroGLYCERIN (NITROSTAT) 0.4 MG SL tablet Place 1 tablet (0.4 mg total) under the tongue every 5 (five) minutes as needed for chest pain. 30 tablet 0  . pantoprazole (PROTONIX) 40 MG tablet Take 40 mg by mouth daily.  0  . POLY-IRON 150 150 MG capsule Take 150 mg by mouth daily.  3  . potassium chloride SA (K-DUR,KLOR-CON) 20 MEQ tablet Take 20 mEq by mouth daily.    Marland Kitchen PREVIDENT 5000 DRY MOUTH 1.1 % GEL dental gel Take by mouth daily. Brush  teeth with paste for at least 1 minute once a day.  5  . sertraline (ZOLOFT) 100 MG tablet Take 100 mg by mouth daily.  11  . vitamin C (ASCORBIC ACID) 500 MG tablet Take 500 mg by mouth daily.    Marland Kitchen warfarin (COUMADIN) 4 MG tablet Take as directed by [facility] 50 tablet 3   No current facility-administered medications for this visit.     Allergies:   Zosyn [piperacillin sod-tazobactam so]   Social History:  The patient  reports that she has never smoked. She has never used smokeless tobacco. She reports that she does not drink alcohol or use drugs.   Family History:  The patient's family history includes Atrial fibrillation in her mother; CAD in her mother; Diabetes in her father and mother; Lung cancer in her father.  ROS:   Review of Systems  Constitutional: Positive for malaise/fatigue. Negative for chills, diaphoresis, fever and weight loss.  HENT: Negative for congestion.   Eyes: Negative for discharge and redness.  Respiratory: Negative for cough, hemoptysis, sputum production, shortness of breath and wheezing.   Cardiovascular: Positive for palpitations. Negative for chest pain, orthopnea, claudication, leg swelling and PND.    Gastrointestinal: Negative for abdominal pain, blood in stool, heartburn, melena, nausea and vomiting.  Genitourinary: Negative for hematuria.  Musculoskeletal: Negative for falls and myalgias.  Skin: Negative for rash.  Neurological: Positive for weakness. Negative for dizziness, tingling, tremors, sensory change, speech change, focal weakness and loss of consciousness.  Endo/Heme/Allergies: Does not bruise/bleed easily.  Psychiatric/Behavioral: Negative for substance abuse. The patient is not nervous/anxious.   All other systems reviewed and are negative.    PHYSICAL EXAM:  VS:  BP 140/68 (BP Location: Left Arm, Patient Position: Sitting, Cuff Size: Normal)   Pulse (!) 54   Ht 5\' 8"  (1.727 m)   Wt 187 lb (84.8 kg)   BMI 28.43 kg/m  BMI: Body mass index is 28.43 kg/m.  Physical Exam  Constitutional: She is oriented to person, place, and time. She appears well-developed and well-nourished.  HENT:  Head: Normocephalic and atraumatic.  Eyes: Right eye exhibits no discharge. Left eye exhibits no discharge.  Neck: Normal range of motion. No JVD present.  Cardiovascular: Normal rate, S1 normal, S2 normal and normal heart sounds.  An irregularly irregular rhythm present. Exam reveals no distant heart sounds, no friction rub, no midsystolic click and no opening snap.   No murmur heard. Pulmonary/Chest: Effort normal and breath sounds normal. No respiratory distress. She has no decreased breath sounds. She has no wheezes. She has no rales. She exhibits no tenderness.  Abdominal: Soft. She exhibits no distension. There is no tenderness.  Musculoskeletal: She exhibits no edema.  Bilateral LE prosthesis.   Neurological: She is alert and oriented to person, place, and time.  Skin: Skin is warm and dry. No cyanosis. Nails show no clubbing.  Psychiatric: She has a normal mood and affect. Her speech is normal and behavior is normal. Judgment and thought content normal.     EKG:  Was ordered  and interpreted by me today. Shows Afib with bradycardic heart rate of 58 bpm, nonspecific IVCD, poor R wave progression, TWI leads I and aVL.   Recent Labs: 07/20/2015: BNP 333.5 10/06/2015: Hemoglobin 13.4; Platelets 168 11/18/2015: ALT 22; TSH 8.210 03/16/2016: BUN 38; Creatinine, Ser 2.27; Potassium 4.1; Sodium 136  06/13/2015: Cholesterol 91; HDL 25; LDL Cholesterol 40; Total CHOL/HDL Ratio 3.6; Triglycerides 128; VLDL 26   CrCl cannot be  calculated (Patient's most recent lab result is older than the maximum 21 days allowed.).   Wt Readings from Last 3 Encounters:  04/19/16 187 lb (84.8 kg)  03/16/16 187 lb (84.8 kg)  02/22/16 187 lb (84.8 kg)     Other studies reviewed: Additional studies/records reviewed today include: summarized above  ASSESSMENT AND PLAN:  1. Persistent Afib as above: She remains in Afib with a bradycardic heart rate in the mid 50's bpm. Decrease amiodarone to 200 mg daily. Continue Coreg 3.125 mg bid. Schedule DCCV with Dr. Rockey Situ. Check pre-DCCV labs. Continue Coumadin per [facility]. She has had therapeutic INR since 03/16/16. CHADS2VASc at least 6 (CHF, HTN, age x 1, DM, vascular disease, female). After her cardioversion she may require further decreasing of her amiodarone if she remains with a bradycardic heart rate.    2. CAD s/p CABG: No symptoms concerning for angina. On Coumadin in place of aspirin. No plans for ischemic evaluation at this time. Coreg as above.   3. ICM/chronic combined CHF: She does not appear volume overloaded. No swelling/edema of the thighs. JVD not elevated. Continue Lasix 40 mg bid with KCl repletion. Check bmet as part of pre-DCCV labs. Limit PO fluids and salt.   4. CKD stage IV: Stable at last check. Check bmet as above given Lasix. Per Renal.   5. HTN: Well controlled. Continue current medications.   6. HLD: Lipitor.   Disposition: F/u with me in 1 week s/p DCCV.   Current medicines are reviewed at length with the patient  today.  The patient did not have any concerns regarding medicines.  Melvern Banker PA-C 04/19/2016 2:11 PM     [address] [place], [place] [address] [phone]

## 2016-04-19 ENCOUNTER — Ambulatory Visit (INDEPENDENT_AMBULATORY_CARE_PROVIDER_SITE_OTHER): Payer: Medicare HMO

## 2016-04-19 ENCOUNTER — Other Ambulatory Visit
Admission: RE | Admit: 2016-04-19 | Discharge: 2016-04-19 | Disposition: A | Discharge status: Home / Self Care | Payer: Medicare HMO | Source: Ambulatory Visit | Attending: Physician Assistant | Admitting: Physician Assistant

## 2016-04-19 ENCOUNTER — Encounter: Payer: Self-pay | Admitting: Physician Assistant

## 2016-04-19 ENCOUNTER — Ambulatory Visit (INDEPENDENT_AMBULATORY_CARE_PROVIDER_SITE_OTHER): Payer: Medicare HMO | Admitting: Physician Assistant

## 2016-04-19 VITALS — BP 140/68 | HR 54 | Ht 68.0 in | Wt 187.0 lb

## 2016-04-19 DIAGNOSIS — Z7901 Long term (current) use of anticoagulants: Secondary | ICD-10-CM | POA: Diagnosis not present

## 2016-04-19 DIAGNOSIS — I481 Persistent atrial fibrillation: Secondary | ICD-10-CM | POA: Diagnosis not present

## 2016-04-19 DIAGNOSIS — I4819 Other persistent atrial fibrillation: Secondary | ICD-10-CM

## 2016-04-19 DIAGNOSIS — Z01812 Encounter for preprocedural laboratory examination: Secondary | ICD-10-CM

## 2016-04-19 DIAGNOSIS — I255 Ischemic cardiomyopathy: Secondary | ICD-10-CM

## 2016-04-19 DIAGNOSIS — I5042 Chronic combined systolic (congestive) and diastolic (congestive) heart failure: Secondary | ICD-10-CM

## 2016-04-19 DIAGNOSIS — I10 Essential (primary) hypertension: Secondary | ICD-10-CM

## 2016-04-19 DIAGNOSIS — N184 Chronic kidney disease, stage 4 (severe): Secondary | ICD-10-CM

## 2016-04-19 DIAGNOSIS — E785 Hyperlipidemia, unspecified: Secondary | ICD-10-CM

## 2016-04-19 LAB — CBC
HEMATOCRIT: 44.5 % (ref 35.0–47.0)
Hemoglobin: 14.9 g/dL (ref 12.0–16.0)
MCH: 28.1 pg (ref 26.0–34.0)
MCHC: 33.4 g/dL (ref 32.0–36.0)
MCV: 83.9 fL (ref 80.0–100.0)
PLATELETS: 156 10*3/uL (ref 150–440)
RBC: 5.3 MIL/uL — ABNORMAL HIGH (ref 3.80–5.20)
RDW: 17.4 % — AB (ref 11.5–14.5)
WBC: 31.5 10*3/uL — ABNORMAL HIGH (ref 3.6–11.0)

## 2016-04-19 LAB — BASIC METABOLIC PANEL
Anion gap: 14 (ref 5–15)
BUN: 37 mg/dL — ABNORMAL HIGH (ref 6–20)
CALCIUM: 9.5 mg/dL (ref 8.9–10.3)
CO2: 20 mmol/L — AB (ref 22–32)
CREATININE: 2.2 mg/dL — AB (ref 0.44–1.00)
Chloride: 100 mmol/L — ABNORMAL LOW (ref 101–111)
GFR, EST AFRICAN AMERICAN: 25 mL/min — AB (ref >60)
GFR, EST NON AFRICAN AMERICAN: 22 mL/min — AB (ref >60)
GLUCOSE: 196 mg/dL — AB (ref 65–99)
Potassium: 4.4 mmol/L (ref 3.5–5.1)
Sodium: 134 mmol/L — ABNORMAL LOW (ref 135–145)

## 2016-04-19 LAB — PROTIME-INR
INR: 2.17
Prothrombin Time: 24.5 seconds — ABNORMAL HIGH (ref 11.4–15.2)

## 2016-04-19 LAB — POCT INR: INR: 2.5

## 2016-04-19 MED ORDER — AMIODARONE HCL 200 MG PO TABS: 200.0000 mg | ORAL_TABLET | Freq: Every day | ORAL | 6 refills | Status: DC

## 2016-04-19 NOTE — Patient Instructions (Addendum)
Medication Instructions:  Your physician has recommended you make the following change in your medication:  DECREASE amiodarone to 200mg  once daily.   Labwork: BMET, CBC, PT/INR  Testing/Procedures: Your physician has recommended that you have a Cardioversion (DCCV). Electrical Cardioversion uses a jolt of electricity to your heart either through paddles or wired patches attached to your chest. This is a controlled, usually prescheduled, procedure. Defibrillation is done under light anesthesia in the hospital, and you usually go home the day of the procedure. This is done to get your heart back into a normal rhythm. You are not awake for the procedure. Please see the instruction sheet given to you today.  Tuesday, August 15 at [place] 6:30am Nothing to eat or drink after midnight the evening before your cardioversion.  You should take your morning medications with a sip of water.    Follow-Up: Your physician recommends that you schedule a follow-up appointment after cardioversion.    Any Other Special Instructions Will Be Listed Below (If Applicable).     If you need a refill on your cardiac medications before your next appointment, please call your pharmacy.   Electrical Cardioversion Electrical cardioversion is the delivery of a jolt of electricity to change the rhythm of the heart. Sticky patches or metal paddles are placed on the chest to deliver the electricity from a device. This is done to restore a normal rhythm. A rhythm that is too fast or not regular keeps the heart from pumping well. Electrical cardioversion is done in an emergency if:   There is low or no blood pressure as a result of the heart rhythm.   Normal rhythm must be restored as fast as possible to protect the brain and heart from further damage.   It may save a life. Cardioversion may be done for heart rhythms that are not immediately life threatening, such as atrial fibrillation or flutter, in  which:   The heart is beating too fast or is not regular.   Medicine to change the rhythm has not worked.   It is safe to wait in order to allow time for preparation.  Symptoms of the abnormal rhythm are bothersome.  The risk of stroke and other serious problems can be reduced. LET [facility] CARE PROVIDER KNOW ABOUT:   Any allergies you have.  All medicines you are taking, including vitamins, herbs, eye drops, creams, and over-the-counter medicines.  Previous problems you or members of your family have had with the use of anesthetics.   Any blood disorders you have.   Previous surgeries you have had.   Medical conditions you have. RISKS AND COMPLICATIONS  Generally, this is a safe procedure. However, problems can occur and include:   Breathing problems related to the anesthetic used.  A blood clot that breaks free and travels to other parts of your body. This could cause a stroke or other problems. The risk of this is lowered by use of blood-thinning medicine (anticoagulant) prior to the procedure.  Cardiac arrest (rare). BEFORE THE PROCEDURE   You may have tests to detect blood clots in your heart and to evaluate heart function.  You may start taking anticoagulants so your blood does not clot as easily.   Medicines may be given to help stabilize your heart rate and rhythm. PROCEDURE  You will be given medicine through an IV tube to reduce discomfort and make you sleepy (sedative).   An electrical shock will be delivered. AFTER THE PROCEDURE Your heart rhythm will  be watched to make sure it does not change.    This information is not intended to replace advice given to you by your health care provider. Make sure you discuss any questions you have with your health care provider.   Document Released: 08/18/2002 Document Revised: 09/18/2014 Document Reviewed: 03/12/2013 Elsevier Interactive Patient Education [employer].

## 2016-04-25 ENCOUNTER — Encounter: Payer: Self-pay | Admitting: *Deleted

## 2016-04-25 ENCOUNTER — Ambulatory Visit: Payer: Medicare HMO | Admitting: Anesthesiology

## 2016-04-25 ENCOUNTER — Encounter
Admission: RE | Disposition: A | Discharge status: Home / Self Care | Payer: Self-pay | Source: Ambulatory Visit | Attending: Cardiovascular Disease

## 2016-04-25 ENCOUNTER — Ambulatory Visit
Admission: RE | Admit: 2016-04-25 | Discharge: 2016-04-25 | Disposition: A | Discharge status: Home / Self Care | Payer: Medicare HMO | Source: Ambulatory Visit | Attending: Cardiovascular Disease | Admitting: Cardiovascular Disease

## 2016-04-25 DIAGNOSIS — I509 Heart failure, unspecified: Secondary | ICD-10-CM | POA: Insufficient documentation

## 2016-04-25 DIAGNOSIS — I481 Persistent atrial fibrillation: Secondary | ICD-10-CM | POA: Diagnosis present

## 2016-04-25 DIAGNOSIS — E118 Type 2 diabetes mellitus with unspecified complications: Secondary | ICD-10-CM | POA: Diagnosis not present

## 2016-04-25 DIAGNOSIS — I4510 Unspecified right bundle-branch block: Secondary | ICD-10-CM | POA: Diagnosis not present

## 2016-04-25 DIAGNOSIS — K219 Gastro-esophageal reflux disease without esophagitis: Secondary | ICD-10-CM | POA: Insufficient documentation

## 2016-04-25 DIAGNOSIS — I2510 Atherosclerotic heart disease of native coronary artery without angina pectoris: Secondary | ICD-10-CM | POA: Diagnosis not present

## 2016-04-25 DIAGNOSIS — I739 Peripheral vascular disease, unspecified: Secondary | ICD-10-CM | POA: Diagnosis not present

## 2016-04-25 DIAGNOSIS — I5032 Chronic diastolic (congestive) heart failure: Secondary | ICD-10-CM

## 2016-04-25 DIAGNOSIS — K449 Diaphragmatic hernia without obstruction or gangrene: Secondary | ICD-10-CM | POA: Diagnosis not present

## 2016-04-25 DIAGNOSIS — I452 Bifascicular block: Secondary | ICD-10-CM | POA: Diagnosis not present

## 2016-04-25 HISTORY — PX: ELECTROPHYSIOLOGIC STUDY: SHX172A

## 2016-04-25 SURGERY — CARDIOVERSION (CATH LAB)
Anesthesia: General

## 2016-04-25 MED ORDER — SODIUM CHLORIDE 0.9 % IV SOLN
INTRAVENOUS | Status: DC
  Administered 2016-04-25: 07:00:00 via INTRAVENOUS

## 2016-04-25 MED ORDER — PROPOFOL 10 MG/ML IV BOLUS
INTRAVENOUS | Status: DC | PRN
  Administered 2016-04-25: 60 mg via INTRAVENOUS

## 2016-04-25 NOTE — Transfer of Care (Signed)
Immediate Anesthesia Transfer of Care Note  Patient: Caroline Hernandez  Procedure(s) Performed: Procedure(s): Cardioversion (N/A)  Patient Location: PACU  Anesthesia Type:General  Level of Consciousness: awake, alert  and oriented  Airway & Oxygen Therapy: Patient connected to nasal cannula oxygen  Post-op Assessment: Post -op Vital signs reviewed and stable  Post vital signs: stable  Last Vitals:  Vitals:   04/25/16 0700 04/25/16 0757  BP: (!) 149/54 (!) 138/56  Pulse: (!) 55 60  Resp: (!) 22 (!) 26  Temp: 36.8 C     Last Pain: There were no vitals filed for this visit.       Complications: No apparent anesthesia complications

## 2016-04-25 NOTE — CV Procedure (Signed)
Cardioversion procedure note For atrial fibrillation, persistent  Procedure Details:  Consent: Risks of procedure as well as the alternatives and risks of each were explained to the (patient/caregiver). Consent for procedure obtained.  Time Out: Verified patient identification, verified procedure, site/side was marked, verified correct patient position, special equipment/implants available, medications/allergies/relevent history reviewed, required imaging and test results available. Performed  Patient placed on cardiac monitor, pulse oximetry, supplemental oxygen as necessary.  Sedation given: propofol IV, Dr. Carroll Pacer pads placed anterior and posterior chest.   Cardioverted 1 time(s).  Cardioverted at  150 J. Synchronized biphasic Converted to NSR   Evaluation: Findings: Post procedure EKG shows: NSR Complications: None Patient did tolerate procedure well.  Time Spent Directly with the Patient:  45 minutes   Tim Aditi Rovira, M.D., Ph.D. 

## 2016-04-25 NOTE — Anesthesia Preprocedure Evaluation (Signed)
Anesthesia Evaluation  Patient identified by MRN, date of birth, ID band Patient awake    Reviewed: Allergy & Precautions, H&P , NPO status , Patient's Chart, lab work & pertinent test results, reviewed documented beta blocker date and time   History of Anesthesia Complications Negative for: history of anesthetic complications  Airway Mallampati: III  TM Distance: >3 FB Neck ROM: limited    Dental  (+) Poor Dentition, Chipped, Missing   Pulmonary neg pulmonary ROS, shortness of breath,    Pulmonary exam normal breath sounds clear to auscultation       Cardiovascular Exercise Tolerance: Poor hypertension, Pt. on medications and Pt. on home beta blockers + angina with exertion + CAD, + CABG, + Peripheral Vascular Disease, +CHF and + DOE  + dysrhythmias Atrial Fibrillation  Rhythm:irregular Rate:Normal     Neuro/Psych  Neuromuscular disease negative neurological ROS  negative psych ROS   GI/Hepatic Neg liver ROS, hiatal hernia, GERD  Controlled,  Endo/Other  diabetes, Poorly Controlled, Type 2, Insulin Dependent  Renal/GU Renal diseasenegative Renal ROS  negative genitourinary   Musculoskeletal   Abdominal   Peds negative pediatric ROS (+)  Hematology negative hematology ROS (+)   Anesthesia Other Findings Past Medical History:   Chronic combined systolic and diastolic CHF (c*                Comment:a. 12/2014 Echo: EF 45-50%;  b. 08/2015 Echo: EF              45-50%, ant, antsept HK, mildly dil LA, nl RV,               mild-mod TR, sev PAH (90mmHg).   Diabetes mellitus without complication ([place])                 Essential hypertension                                       Amputation of right lower extremity below knee*              Amputation of left lower extremity below knee *              Persistent atrial fibrillation ([facility])                           Comment:a. Dx 12/2014.  CHA2DS2VASc = 6--> warfarin   GERD  (gastroesophageal reflux disease)                       Hyperlipidemia                                               CLL (chronic lymphocytic leukemia) ([facility])                     Renal failure                                                CAD (coronary artery disease)  Comment:a. 2004 Cardiac Arrest/CABG x 3 (LIMA->LAD,               VG->OM, VG->RCA);  b. 06/2015 lexiscan MV: no               significant ischemia, EF 48%, low risk->Med Rx.   Ischemic cardiomyopathy                                        Comment:a. s/p MDT ICD (originally had [id number] lead-->gen               change and lead revision ~ 2012 @ [place]); b.               12/2014 Echo: EF 45-50%;  c.08/2015 Echo: EF               45-50%, ant, antsept HK. Nl RV.   History of cardiac arrest                                      Comment:a. 2004.   Hiatal hernia                                                CKD (chronic kidney disease), stage IV (HCC)                 PAD (peripheral artery disease) (HCC)                          Comment:a. s/p BKA  Past Surgical History:   ABDOMINAL HYSTERECTOMY                                        INSERT / REPLACE / REMOVE PACEMAKER                           Amputation lower extremity bilaterally          Bilateral              CORONARY ARTERY BYPASS GRAFT                                  CARDIAC CATHETERIZATION                                       Signs and symptoms suggestive of sleep apnea    Reproductive/Obstetrics negative OB ROS                             Anesthesia Physical  Anesthesia Plan  ASA: IV  Anesthesia Plan: General   Post-op Pain Management:    Induction:   Airway Management Planned:   Additional Equipment:   Intra-op Plan: Utilization of Controlled Hypotension per surrgeon request  Post-operative Plan:   Informed Consent: I have reviewed the patients History and Physical, chart,  labs and discussed the  procedure including the risks, benefits and alternatives for the proposed anesthesia with the patient or authorized representative who has indicated his/her understanding and acceptance.   Dental Advisory Given  Plan Discussed with: Anesthesiologist, CRNA and Surgeon  Anesthesia Plan Comments:         Anesthesia Quick Evaluation

## 2016-04-25 NOTE — Discharge Instructions (Signed)
Electrical Cardioversion, Care After °Refer to this sheet in the next few weeks. These instructions provide you with information on caring for yourself after your procedure. Your health care provider may also give you more specific instructions. Your treatment has been planned according to current medical practices, but problems sometimes occur. Call your health care provider if you have any problems or questions after your procedure. °WHAT TO EXPECT AFTER THE PROCEDURE °After your procedure, it is typical to have the following sensations: °· Some redness on the skin where the shocks were delivered. If this is tender, a sunburn lotion or hydrocortisone cream may help. °· Possible return of an abnormal heart rhythm within hours or days after the procedure. °HOME CARE INSTRUCTIONS °· Take medicines only as directed by your health care provider. Be sure you understand how and when to take your medicine. °· Learn how to feel your pulse and check it often. °· Limit your activity for 48 hours after the procedure or as directed by your health care provider. °· Avoid or minimize caffeine and other stimulants as directed by your health care provider. °SEEK MEDICAL CARE IF: °· You feel like your heart is beating too fast or your pulse is not regular. °· You have any questions about your medicines. °· You have bleeding that will not stop. °SEEK IMMEDIATE MEDICAL CARE IF: °· You are dizzy or feel faint. °· It is hard to breathe or you feel short of breath. °· There is a change in discomfort in your chest. °· Your speech is slurred or you have trouble moving an arm or leg on one side of your body. °· You get a serious muscle cramp that does not go away. °· Your fingers or toes turn cold or blue. °  °This information is not intended to replace advice given to you by your health care provider. Make sure you discuss any questions you have with your health care provider. °  °Document Released: 06/18/2013 Document Revised: 09/18/2014  Document Reviewed: 06/18/2013 °Elsevier Interactive Patient Education ©2016 Elsevier Inc. ° °

## 2016-04-25 NOTE — Anesthesia Preprocedure Evaluation (Signed)
Anesthesia Evaluation  Patient identified by MRN, date of birth, ID band Patient awake    Reviewed: Allergy & Precautions, H&P , NPO status , Patient's Chart, lab work & pertinent test results  Airway Mallampati: II       Dental   Pulmonary shortness of breath,           Cardiovascular Exercise Tolerance: Poor + CAD, + Peripheral Vascular Disease and +CHF   Rhythm:Irregular     Neuro/Psych  Neuromuscular disease    GI/Hepatic hiatal hernia, GERD  ,  Endo/Other  diabetes, Type 2  Renal/GU Renal disease     Musculoskeletal   Abdominal   Peds  Hematology   Anesthesia Other Findings   Reproductive/Obstetrics                             Anesthesia Physical Anesthesia Plan Anesthesia Quick Evaluation

## 2016-04-26 ENCOUNTER — Telehealth: Payer: Self-pay | Admitting: Cardiovascular Disease

## 2016-04-26 NOTE — Telephone Encounter (Signed)
Returned call to pt's daughter, she states pt had INR checked yesterday prior to [place] at [facility]. No INR results in computer? But typically INR is checked prior to cardioversion.  Rescheduled today's appt to next week 05/03/16, 1 week after DCCV.

## 2016-04-26 NOTE — Anesthesia Postprocedure Evaluation (Signed)
Anesthesia Post Note  Patient: Caroline Hernandez  Procedure(s) Performed: Procedure(s) (LRB): Cardioversion (N/A)  Patient location during evaluation: PACU Anesthesia Type: General Level of consciousness: awake and alert and oriented Pain management: pain level controlled Vital Signs Assessment: post-procedure vital signs reviewed and stable Respiratory status: spontaneous breathing Cardiovascular status: blood pressure returned to baseline Anesthetic complications: no    Last Vitals:  Vitals:   04/25/16 0806 04/25/16 0815  BP: 119/61 133/63  Pulse: 62   Resp: 20   Temp:      Last Pain: There were no vitals filed for this visit.               Militza Devery

## 2016-04-26 NOTE — Telephone Encounter (Signed)
Pt states she had her INR checked yesterday when she had her cardioversion. Please call.

## 2016-05-03 ENCOUNTER — Ambulatory Visit (INDEPENDENT_AMBULATORY_CARE_PROVIDER_SITE_OTHER): Payer: Medicare HMO

## 2016-05-03 DIAGNOSIS — I4819 Other persistent atrial fibrillation: Secondary | ICD-10-CM

## 2016-05-03 DIAGNOSIS — Z7901 Long term (current) use of anticoagulants: Secondary | ICD-10-CM | POA: Diagnosis not present

## 2016-05-03 DIAGNOSIS — I481 Persistent atrial fibrillation: Secondary | ICD-10-CM

## 2016-05-03 LAB — POCT INR: INR: 2.2

## 2016-05-28 ENCOUNTER — Other Ambulatory Visit: Payer: Self-pay | Admitting: Cardiovascular Disease

## 2016-05-29 ENCOUNTER — Encounter: Payer: Self-pay | Admitting: Physician Assistant

## 2016-05-29 NOTE — Telephone Encounter (Signed)
Review for refill, Thank you. 

## 2016-05-30 ENCOUNTER — Encounter: Payer: Self-pay | Admitting: Physician Assistant

## 2016-05-30 ENCOUNTER — Ambulatory Visit (INDEPENDENT_AMBULATORY_CARE_PROVIDER_SITE_OTHER): Payer: Medicare HMO | Admitting: Physician Assistant

## 2016-05-30 ENCOUNTER — Ambulatory Visit (INDEPENDENT_AMBULATORY_CARE_PROVIDER_SITE_OTHER): Payer: Medicare HMO

## 2016-05-30 VITALS — BP 130/60 | HR 53 | Ht 68.0 in | Wt 188.0 lb

## 2016-05-30 DIAGNOSIS — I2510 Atherosclerotic heart disease of native coronary artery without angina pectoris: Secondary | ICD-10-CM

## 2016-05-30 DIAGNOSIS — I10 Essential (primary) hypertension: Secondary | ICD-10-CM

## 2016-05-30 DIAGNOSIS — I4891 Unspecified atrial fibrillation: Secondary | ICD-10-CM

## 2016-05-30 DIAGNOSIS — Z951 Presence of aortocoronary bypass graft: Secondary | ICD-10-CM

## 2016-05-30 DIAGNOSIS — I481 Persistent atrial fibrillation: Secondary | ICD-10-CM

## 2016-05-30 DIAGNOSIS — I5022 Chronic systolic (congestive) heart failure: Secondary | ICD-10-CM

## 2016-05-30 DIAGNOSIS — I255 Ischemic cardiomyopathy: Secondary | ICD-10-CM

## 2016-05-30 DIAGNOSIS — N184 Chronic kidney disease, stage 4 (severe): Secondary | ICD-10-CM

## 2016-05-30 DIAGNOSIS — Z7901 Long term (current) use of anticoagulants: Secondary | ICD-10-CM

## 2016-05-30 DIAGNOSIS — I4819 Other persistent atrial fibrillation: Secondary | ICD-10-CM

## 2016-05-30 DIAGNOSIS — E785 Hyperlipidemia, unspecified: Secondary | ICD-10-CM

## 2016-05-30 LAB — POCT INR: INR: 3

## 2016-05-30 NOTE — Progress Notes (Signed)
Cardiology Office Note Date:  05/30/2016  Patient ID:  Caroline Hernandez, DOB 1946-06-16, MRN [id number] PCP:  Pcp Not In System  Cardiologist:  Dr. Rockey Situ, MD    Chief Complaint: Follow up Afib  History of Present Illness: Caroline Hernandez is a 70 y.o. female with history of CAD as below, ICM/chronic combined CHF, persistent Afib s/p DCCV 09/2015 s/p repeat successful DCCV 04/25/2016, PAD s/p BKA, CKD stage IV, CLL, HTN, and HLD who presents for follow up of Afib s/p repeat DCCV as above.    She is status post cardiac arrest and coronary artery bypass grafting 3 in 2004. This was complicated by ischemic cardiomyopathy and systolic heart failure and she subsequently underwent ICD placement in 2005. In the spring of 2016, she was diagnosed with atrial fibrillation. This was rate controlled and she was placed on warfarin. In October 2016, she was admitted to [facility] with complaints of chest pain. Stress testing was negative. She followed up in November and then again in December and expressed interest in cardioversion. Of note, echocardiogram in December showed an EF of 45-50% with mildly dilated left atrium. She also was noted to have severe pulmonary hypertension. She was subsequently placed on amiodarone and underwent cardioversion in January 2017. She did well and was maintaining sinus rhythm. In April, she was noted to have some irregularity to her heart rhythm at an endocrinology appointment. She thought she had been in Afib since that time at her office visit in July 2107. In that setting, she was having some increasing dyspnea on exertion and LE swelling, affecting her thighs (bilateral BKA's). She saw Dr. Rockey Situ in mid June and was in atrial fibrillation. She was bradycardic. Her carvedilol dose was reduced and amiodarone was increased to 200 mg twice a day. Arrangement for follow-up was made and it was felt that if she remained in Afib at follow-up, she would require repeat cardioversion. In  July, she had been taking Lasix 40 mg twice a day. With that, she noted some improvement in edema. Further, on the lower dose of beta blocker, her resting heart rates had been in the 50s and she noted less fatigue. She continued to have dyspnea on exertion and remained interested in repeat cardioversion. She was noted to have an INR on June 28 that was subtherapeutic at 1.9, thus she was required to wait 3-4 weeks and have weekly INR checks to establish appropriate anticoagulation as she did not wish to undergo TEE/DCCV. In follow up on 8/9 her INR has been therapeutic since 03/16/16. At that time she continued to note palpitations and generalized weakness. She had not missed any doses of her medications. Weight was stable. She underwent successful DCCV on 04/25/16 and has maintained sinus rhythm since. Most recent INR on 05/03/16 was 2.2.  She comes in today back in Afib, rate controlled with heart rates in the 50-60 bpm. She reports feeling like she was back in Afib approximately 4 days after his DCCV. She denies any SOB or increased fatigue. She does occassionally note some palpitations. Weight has been stable. No further swelling of her thighs. She has not missed any doses of her Coumadin. She is due for an INR check today.    Past Medical History:  Diagnosis Date  . Amputation of left lower extremity below knee ([place])   . Amputation of right lower extremity below knee ([place])   . CAD (coronary artery disease)    a. 2004 Cardiac Arrest/CABG x 3 (LIMA->LAD, VG->OM, VG->RCA);  b. 06/2015 lexiscan MV: no significant ischemia, EF 48%, low risk->Med Rx.  . Chronic combined systolic and diastolic CHF (congestive heart failure) ([place])    a. 12/2014 Echo: EF 45-50%;  b. 08/2015 Echo: EF 45-50%, ant, antsept HK, mildly dil LA, nl RV, mild-mod TR, sev PAH (31mmHg).  . CKD (chronic kidney disease), stage IV ([place])   . CLL (chronic lymphocytic leukemia) ([place])   . Diabetes mellitus without complication ([place])   .  Essential hypertension   . GERD (gastroesophageal reflux disease)   . Hiatal hernia   . History of cardiac arrest    a. 2004.  Marland Kitchen Hyperlipidemia   . Ischemic cardiomyopathy    a. s/p MDT ICD (originally had 0174 lead-->gen change and lead revision ~ 2012 @ [place]); b. 12/2014 Echo: EF 45-50%;  c.08/2015 Echo: EF 45-50%, ant, antsept HK. Nl RV.  Marland Kitchen PAD (peripheral artery disease) ([facility])    a. s/p BKA  . Persistent atrial fibrillation ([place])    a. Dx 12/2014.  CHA2DS2VASc = 6--> warfarin;  b. 09/2015 s/p DCCV-->on amio; c. s/p DCCV 04/25/16    Past Surgical History:  Procedure Laterality Date  . ABDOMINAL HYSTERECTOMY    . Amputation lower extremity bilaterally Bilateral   . CARDIAC CATHETERIZATION    . CORONARY ARTERY BYPASS GRAFT    . ELECTROPHYSIOLOGIC STUDY N/A 10/07/2015   Procedure: CARDIOVERSION;  Surgeon: Minna Merritts, MD;  Location: [facility] ORS;  Service: Cardiovascular;  Laterality: N/A;  . ELECTROPHYSIOLOGIC STUDY N/A 04/25/2016   Procedure: Cardioversion;  Surgeon: Minna Merritts, MD;  Location: [facility] ORS;  Service: Cardiovascular;  Laterality: N/A;  . INSERT / REPLACE / REMOVE PACEMAKER  2005   [employer]     Current Outpatient Prescriptions  Medication Sig Dispense Refill  . amiodarone (PACERONE) 200 MG tablet Take 1 tablet (200 mg total) by mouth daily. 30 tablet 6  . amitriptyline (ELAVIL) 25 MG tablet Take 25 mg by mouth every evening.  11  . amLODipine (NORVASC) 10 MG tablet Take 1 tablet (10 mg total) by mouth daily as needed. (Patient taking differently: Take 10 mg by mouth daily. ) 90 tablet 3  . atorvastatin (LIPITOR) 40 MG tablet Take 40 mg by mouth daily at 6 PM.   11  . carvedilol (COREG) 3.125 MG tablet Take 1 tablet (3.125 mg total) by mouth 2 (two) times daily. 60 tablet 6  . Cholecalciferol (HM VITAMIN D3) 4000 UNITS CAPS Take 4,000 Units by mouth daily.    . furosemide (LASIX) 40 MG tablet Take 1 tablet (40 mg total) by mouth 2 (two) times daily. (Patient taking  differently: Take 40 mg by mouth 2 (two) times daily as needed for fluid. ) 60 tablet 11  . HUMALOG 100 UNIT/ML injection Inject 10-14 Units into the skin See admin instructions. Inject 12 units subcutaneous in the morning with breakfast, 10 units subcutaneous at lunch, and 14 units subcutaneous at suppertime.  6  . isosorbide mononitrate (IMDUR) 30 MG 24 hr tablet Take 1 tablet (30 mg total) by mouth 2 (two) times daily. 60 tablet 11  . LANTUS 100 UNIT/ML injection Inject 14-35 Units into the skin See admin instructions. Inject 35 units subcutaneous once a day in the morning. Inject 14 units subcutaneous once a day at bedtime.  0  . losartan (COZAAR) 100 MG tablet Take 100 mg by mouth daily.  11  . Multiple Vitamins-Minerals (OCUTABS-LUTEIN) TABS Take 1 tablet by mouth daily.    . nitroGLYCERIN (NITROSTAT) 0.4 MG  SL tablet Place 1 tablet (0.4 mg total) under the tongue every 5 (five) minutes as needed for chest pain. 30 tablet 0  . pantoprazole (PROTONIX) 40 MG tablet Take 40 mg by mouth every evening.   0  . POLY-IRON 150 150 MG capsule Take 150 mg by mouth daily.  3  . potassium chloride SA (K-DUR,KLOR-CON) 20 MEQ tablet Take 20 mEq by mouth daily.    Marland Kitchen PREVIDENT 5000 DRY MOUTH 1.1 % GEL dental gel Take by mouth daily. Brush teeth with paste for at least 1 minute once a day.  5  . sertraline (ZOLOFT) 100 MG tablet Take 100 mg by mouth at bedtime.   11  . vitamin C (ASCORBIC ACID) 500 MG tablet Take 500 mg by mouth daily.    Marland Kitchen warfarin (COUMADIN) 4 MG tablet TAKE AS DIRECTED BY COUMADIN CLINIC 50 tablet 3   No current facility-administered medications for this visit.     Allergies:   Zosyn [piperacillin sod-tazobactam so]   Social History:  The patient  reports that she has never smoked. She has never used smokeless tobacco. She reports that she does not drink alcohol or use drugs.   Family History:  The patient's family history includes Atrial fibrillation in her mother; CAD in her mother;  Diabetes in her father and mother; Lung cancer in her father.  ROS:   Review of Systems  Constitutional: Positive for malaise/fatigue. Negative for chills, diaphoresis, fever and weight loss.  HENT: Negative for congestion.   Eyes: Negative for discharge and redness.  Respiratory: Negative for cough, hemoptysis, sputum production, shortness of breath and wheezing.   Cardiovascular: Positive for palpitations. Negative for chest pain, orthopnea, claudication, leg swelling and PND.  Gastrointestinal: Negative for abdominal pain, blood in stool, heartburn, melena, nausea and vomiting.  Genitourinary: Negative for hematuria.  Musculoskeletal: Negative for falls and myalgias.  Skin: Negative for rash.  Neurological: Negative for dizziness, tingling, tremors, sensory change, speech change, focal weakness, loss of consciousness and weakness.  Endo/Heme/Allergies: Does not bruise/bleed easily.  Psychiatric/Behavioral: Negative for substance abuse. The patient is not nervous/anxious.   All other systems reviewed and are negative.    PHYSICAL EXAM:  VS:  BP 130/60 (BP Location: Right Arm, Patient Position: Sitting, Cuff Size: Normal)   Pulse (!) 53   Ht 5\' 8"  (1.727 m)   Wt 188 lb (85.3 kg)   BMI 28.59 kg/m  BMI: Body mass index is 28.59 kg/m.  Physical Exam  Constitutional: She is oriented to person, place, and time. She appears well-developed and well-nourished.  HENT:  Head: Normocephalic and atraumatic.  Eyes: Right eye exhibits no discharge. Left eye exhibits no discharge.  Neck: Normal range of motion. No JVD present.  Cardiovascular: S1 normal, S2 normal and normal heart sounds.  An irregularly irregular rhythm present. Exam reveals no distant heart sounds, no friction rub, no midsystolic click and no opening snap.   No murmur heard. Pulmonary/Chest: Effort normal and breath sounds normal. No respiratory distress. She has no decreased breath sounds. She has no wheezes. She has no  rales. She exhibits no tenderness.  Abdominal: Soft. She exhibits no distension. There is no tenderness.  Musculoskeletal: She exhibits no edema.  Neurological: She is alert and oriented to person, place, and time.  Skin: Skin is warm and dry. No cyanosis. Nails show no clubbing.  Psychiatric: She has a normal mood and affect. Her speech is normal and behavior is normal. Judgment and thought content normal.  EKG:  Was ordered and interpreted by me today. Shows Afib, 60 bpm, left axis deviation, TWI I, aVL (old)  Recent Labs: 07/20/2015: BNP 333.5 11/18/2015: ALT 22; TSH 8.210 04/19/2016: BUN 37; Creatinine, Ser 2.20; Hemoglobin 14.9; Platelets 156; Potassium 4.4; Sodium 134  06/13/2015: Cholesterol 91; HDL 25; LDL Cholesterol 40; Total CHOL/HDL Ratio 3.6; Triglycerides 128; VLDL 26   CrCl cannot be calculated (Patient's most recent lab result is older than the maximum 21 days allowed.).   Wt Readings from Last 3 Encounters:  05/30/16 188 lb (85.3 kg)  04/25/16 187 lb (84.8 kg)  04/19/16 187 lb (84.8 kg)     Other studies reviewed: Additional studies/records reviewed today include: summarized above  ASSESSMENT AND PLAN:  1. Persistent Afib s/p DCCV as above: back in rate controlled Afib at this time. She reports her DCCV was successful for approximately 4 days. Given this was her 2nd DCCV on amiodarone with both being unsuccessful long term it appears amiodarone is not working well for her. Will go ahead and stop amiodarone at this time. This is likely to lead to some rebound in her heart rate. Should she note this increase she will need to increase her Coreg subsequently in an effort to maintain adequate rate control of her Afib. She is not a candidate for flecainide given her history of CAD s/p CABG. Consider Tikosyn moving forward. She would like to be seen by the Afib Clinic in [place] moving forward to discuss further medical treatment options vs invasive options. On warfarin. INR today  3.0. CHADS2VASc at least 6 (CHF, HTN, age x 1, DM, vascular disease, female).   2. CAD s/p CABG: No symptoms concerning for angina at this time. On Coumadin in place of ASA. Coreg as above, titrate for rate control as needed now that she has been taken off amiodarone.   3. Chronic systolic CHF/ICM: She does not appear to be volume overloaded. Continue Coreg and Lasix.   4. CKD stage IV: Stable.   5. HTN: Well controlled today. Continue current medications.   6. HLD: Lipitor. LDL 40 on 06/2016. Plan for follow up lipid and liver function.   Disposition: F/u with Dr. Rockey Situ, MD in 2 months. Referral to Afib Clinic.  Current medicines are reviewed at length with the patient today.  The patient did not have any concerns regarding medicines.  Melvern Banker PA-C 05/30/2016 1:58 PM     [address] [place], [place] [address] [phone]

## 2016-05-30 NOTE — Patient Instructions (Addendum)
Medication Instructions:  Please STOP amiodarone  Labwork: None  Testing/Procedures: You have been referred to the afib clinic. They will contact you w/ an appointment  Follow-Up: Your physician recommends that you schedule a follow-up appointment in:  2 months w/ Dr. Rockey Situ  If you need a refill on your cardiac medications before your next appointment, please call your pharmacy.  Atrial Fibrillation Clinic at [facility] [place], [place] [address] Hours of Operation Monday - Friday: 8:30 a.m. to 5:00 p.m. Main: [place] Patients with atrial fibrillation typically have multiple co-morbidities and are at higher risk for stroke and heart failure, resulting in recurrent admissions to the hospital. The resources at the [facility] are available to help co-manage patients with this condition. Collaboration with cardiology, primary care, pharmacy, nutrition and electrophysiology will provide quick access and prevent re-admissions to the hospital as well as improve quality of life. The [facility] is staffed by a dedicated, multi-disciplinary team. Our experts co-manage patients with: New onset of atrial fibrillation  Rate/Rhythm control  Symptom management  Education  Pharmacological cardioversion We will see patients within 3 days after discharge to assure appropriate follow-up care consistent with evidence based guidelines and serve as a resource to patients and referring providers.  The Atrial Fibrillation Clinic is located at the [facility].  Atrial Fibrillation Atrial fibrillation is a type of irregular or rapid heartbeat (arrhythmia). In atrial fibrillation, the heart quivers continuously in a chaotic pattern. This occurs when parts of the heart receive disorganized signals that make the heart unable to pump blood normally. This can increase the risk  for stroke, heart failure, and other heart-related conditions. There are different types of atrial fibrillation, including:  Paroxysmal atrial fibrillation. This type starts suddenly, and it usually stops on its own shortly after it starts.  Persistent atrial fibrillation. This type often lasts longer than a week. It may stop on its own or with treatment.  Long-lasting persistent atrial fibrillation. This type lasts longer than 12 months.  Permanent atrial fibrillation. This type does not go away. Talk with your health care provider to learn about the type of atrial fibrillation that you have. CAUSES This condition is caused by some heart-related conditions or procedures, including:  A heart attack.  Coronary artery disease.  Heart failure.  Heart valve conditions.  High blood pressure.  Inflammation of the sac that surrounds the heart (pericarditis).  Heart surgery.  Certain heart rhythm disorders, such as Wolf-Parkinson-Audia syndrome. Other causes include:  Pneumonia.  Obstructive sleep apnea.  Blockage of an artery in the lungs (pulmonary embolism, or PE).  Lung cancer.  Chronic lung disease.  Thyroid problems, especially if the thyroid is overactive (hyperthyroidism).  Caffeine.  Excessive alcohol use or illegal drug use.  Use of some medicines, including certain decongestants and diet pills. Sometimes, the cause cannot be found. RISK FACTORS This condition is more likely to develop in:  People who are older in age.  People who smoke.  People who have diabetes mellitus.  People who are overweight (obese).  Athletes who exercise vigorously. SYMPTOMS Symptoms of this condition include:  A feeling that your heart is beating rapidly or irregularly.  A feeling of discomfort or pain in your chest.  Shortness of breath.  Sudden light-headedness or weakness.  Getting tired easily during exercise. In some cases, there are no  symptoms. DIAGNOSIS Your health care provider may  be able to detect atrial fibrillation when taking your pulse. If detected, this condition may be diagnosed with:  An electrocardiogram (ECG).  A Holter monitor test that records your heartbeat patterns over a 24-hour period.  Transthoracic echocardiogram (TTE) to evaluate how blood flows through your heart.  Transesophageal echocardiogram (TEE) to view more detailed images of your heart.  A stress test.  Imaging tests, such as a CT scan or chest X-ray.  Blood tests. TREATMENT The main goals of treatment are to prevent blood clots from forming and to keep your heart beating at a normal rate and rhythm. The type of treatment that you receive depends on many factors, such as your underlying medical conditions and how you feel when you are experiencing atrial fibrillation. This condition may be treated with:  Medicine to slow down the heart rate, bring the heart's rhythm back to normal, or prevent clots from forming.  Electrical cardioversion. This is a procedure that resets your heart's rhythm by delivering a controlled, low-energy shock to the heart through your skin.  Different types of ablation, such as catheter ablation, catheter ablation with pacemaker, or surgical ablation. These procedures destroy the heart tissues that send abnormal signals. When the pacemaker is used, it is placed under your skin to help your heart beat in a regular rhythm. HOME CARE INSTRUCTIONS  Take over-the counter and prescription medicines only as told by your health care provider.  If your health care provider prescribed a blood-thinning medicine (anticoagulant), take it exactly as told. Taking too much blood-thinning medicine can cause bleeding. If you do not take enough blood-thinning medicine, you will not have the protection that you need against stroke and other problems.  Do not use tobacco products, including cigarettes, chewing tobacco, and  e-cigarettes. If you need help quitting, ask your health care provider.  If you have obstructive sleep apnea, manage your condition as told by your health care provider.  Do not drink alcohol.  Do not drink beverages that contain caffeine, such as coffee, soda, and tea.  Maintain a healthy weight. Do not use diet pills unless your health care provider approves. Diet pills may make heart problems worse.  Follow diet instructions as told by your health care provider.  Exercise regularly as told by your health care provider.  Keep all follow-up visits as told by your health care provider. This is important. PREVENTION  Avoid drinking beverages that contain caffeine or alcohol.  Avoid certain medicines, especially medicines that are used for breathing problems.  Avoid certain herbs and herbal medicines, such as those that contain ephedra or ginseng.  Do not use illegal drugs, such as cocaine and amphetamines.  Do not smoke.  Manage your high blood pressure. SEEK MEDICAL CARE IF:  You notice a change in the rate, rhythm, or strength of your heartbeat.  You are taking an anticoagulant and you notice increased bruising.  You tire more easily when you exercise or exert yourself. SEEK IMMEDIATE MEDICAL CARE IF:  You have chest pain, abdominal pain, sweating, or weakness.  You feel nauseous.  You notice blood in your vomit, bowel movement, or urine.  You have shortness of breath.  You suddenly have swollen feet and ankles.  You feel dizzy.  You have sudden weakness or numbness of the face, arm, or leg, especially on one side of the body.  You have trouble speaking, trouble understanding, or both (aphasia).  Your face or your eyelid droops on one side. These symptoms may represent a serious  problem that is an emergency. Do not wait to see if the symptoms will go away. Get medical help right away. Call your local emergency services (911 in the [place] Do not drive yourself to  the hospital.   This information is not intended to replace advice given to you by your health care provider. Make sure you discuss any questions you have with your health care provider.   Document Released: 08/28/2005 Document Revised: 05/19/2015 Document Reviewed: 12/23/2014 Elsevier Interactive Patient Education [employer].

## 2016-06-01 ENCOUNTER — Telehealth (HOSPITAL_COMMUNITY): Payer: Self-pay | Admitting: *Deleted

## 2016-06-01 NOTE — Telephone Encounter (Signed)
I cld pt to schedule an appt with afib clinic per referral from Christell Faith, [place]

## 2016-06-05 ENCOUNTER — Other Ambulatory Visit: Payer: Self-pay | Admitting: Physician Assistant

## 2016-06-06 ENCOUNTER — Ambulatory Visit (HOSPITAL_COMMUNITY)
Admission: RE | Admit: 2016-06-06 | Discharge: 2016-06-06 | Disposition: A | Discharge status: Home / Self Care | Payer: Medicare HMO | Source: Ambulatory Visit | Attending: Nurse Practitioner | Admitting: Nurse Practitioner

## 2016-06-06 ENCOUNTER — Encounter (HOSPITAL_COMMUNITY): Payer: Self-pay | Admitting: Nurse Practitioner

## 2016-06-06 VITALS — BP 182/80 | HR 56

## 2016-06-06 DIAGNOSIS — K219 Gastro-esophageal reflux disease without esophagitis: Secondary | ICD-10-CM | POA: Insufficient documentation

## 2016-06-06 DIAGNOSIS — I255 Ischemic cardiomyopathy: Secondary | ICD-10-CM | POA: Diagnosis not present

## 2016-06-06 DIAGNOSIS — E1122 Type 2 diabetes mellitus with diabetic chronic kidney disease: Secondary | ICD-10-CM | POA: Diagnosis not present

## 2016-06-06 DIAGNOSIS — Z794 Long term (current) use of insulin: Secondary | ICD-10-CM | POA: Insufficient documentation

## 2016-06-06 DIAGNOSIS — Z8674 Personal history of sudden cardiac arrest: Secondary | ICD-10-CM | POA: Diagnosis not present

## 2016-06-06 DIAGNOSIS — K449 Diaphragmatic hernia without obstruction or gangrene: Secondary | ICD-10-CM | POA: Diagnosis not present

## 2016-06-06 DIAGNOSIS — I130 Hypertensive heart and chronic kidney disease with heart failure and stage 1 through stage 4 chronic kidney disease, or unspecified chronic kidney disease: Secondary | ICD-10-CM | POA: Insufficient documentation

## 2016-06-06 DIAGNOSIS — I2510 Atherosclerotic heart disease of native coronary artery without angina pectoris: Secondary | ICD-10-CM | POA: Insufficient documentation

## 2016-06-06 DIAGNOSIS — E785 Hyperlipidemia, unspecified: Secondary | ICD-10-CM | POA: Insufficient documentation

## 2016-06-06 DIAGNOSIS — Z79899 Other long term (current) drug therapy: Secondary | ICD-10-CM | POA: Diagnosis not present

## 2016-06-06 DIAGNOSIS — I481 Persistent atrial fibrillation: Secondary | ICD-10-CM | POA: Diagnosis present

## 2016-06-06 DIAGNOSIS — N184 Chronic kidney disease, stage 4 (severe): Secondary | ICD-10-CM | POA: Insufficient documentation

## 2016-06-06 DIAGNOSIS — I5042 Chronic combined systolic (congestive) and diastolic (congestive) heart failure: Secondary | ICD-10-CM | POA: Insufficient documentation

## 2016-06-06 DIAGNOSIS — I4819 Other persistent atrial fibrillation: Secondary | ICD-10-CM

## 2016-06-06 DIAGNOSIS — Z7901 Long term (current) use of anticoagulants: Secondary | ICD-10-CM | POA: Insufficient documentation

## 2016-06-07 NOTE — Progress Notes (Signed)
Primary Care Physician: Pcp Not In System Referring Physician:   [name] is a 70 y.o. female with a h/o  CAD , ICM/chronic combined CHF, persistent Afib s/p DCCV 09/2015 s/p repeat successful DCCV 04/25/2016, held x 2-3 weeks, PAD s/p BKA, CKD stage IV, CLL, HTN, and HLD,  s/p repeat DCCV, in August,which had return of afib in 2-3 days, having failed amiodarone, drug d/ced 9/19.  She is in the office for discussion for options to restore SR. She has BTK double amputations of LE due to vascular disease and wears prothesis. She lives with her daughter and stays in her w/c most of the day when she is alone. However, when family is there, she is ambulatory. She has had some falls in the past and feels more comfortable walking with some one present. Despite this, she tries to be very active doing all the laundry and helping with other house activities and some of the cooking. When she had cardioversion in January that held for 2-3 weeks, she felt so much better and the daughter states that she could tell a big jump in her energy. She states if she had to live in afib, she could function, but she has more fatigue. She has CKD with creatinine stable around 2.2. She is followed by a nephrologist. We discussed tikosyn as an option. Review of the EKG's I could find in SR show a QTc around/slighly over 440. She is on two drugs that can prolong QTC. Elavil and zoloft.  Today, she denies symptoms of palpitations, chest pain, shortness of breath, orthopnea, PND, lower extremity edema, dizziness, presyncope, syncope, or neurologic sequela. The patient is tolerating medications without difficulties and is otherwise without complaint today.   Past Medical History:  Diagnosis Date  . Amputation of left lower extremity below knee ([place])   . Amputation of right lower extremity below knee ([place])   . CAD (coronary artery disease)    a. 2004 Cardiac Arrest/CABG x 3 (LIMA->LAD, VG->OM, VG->RCA);  b. 06/2015  lexiscan MV: no significant ischemia, EF 48%, low risk->Med Rx.  . Chronic combined systolic and diastolic CHF (congestive heart failure) ([place])    a. 12/2014 Echo: EF 45-50%;  b. 08/2015 Echo: EF 45-50%, ant, antsept HK, mildly dil LA, nl RV, mild-mod TR, sev PAH (26mmHg).  . CKD (chronic kidney disease), stage IV ([place])   . CLL (chronic lymphocytic leukemia) ([place])   . Diabetes mellitus without complication ([place])   . Essential hypertension   . GERD (gastroesophageal reflux disease)   . Hiatal hernia   . History of cardiac arrest    a. 2004.  Marland Kitchen Hyperlipidemia   . Ischemic cardiomyopathy    a. s/p MDT ICD (originally had 4332 lead-->gen change and lead revision ~ 2012 @ [facility]); b. 12/2014 Echo: EF 45-50%;  c.08/2015 Echo: EF 45-50%, ant, antsept HK. Nl RV.  Marland Kitchen PAD (peripheral artery disease) ([facility])    a. s/p BKA  . Persistent atrial fibrillation ([place])    a. Dx 12/2014.  CHA2DS2VASc = 6--> warfarin;  b. 09/2015 s/p DCCV-->on amio; c. s/p DCCV 04/25/16   Past Surgical History:  Procedure Laterality Date  . ABDOMINAL HYSTERECTOMY    . Amputation lower extremity bilaterally Bilateral   . CARDIAC CATHETERIZATION    . CORONARY ARTERY BYPASS GRAFT    . ELECTROPHYSIOLOGIC STUDY N/A 10/07/2015   Procedure: CARDIOVERSION;  Surgeon: Minna Merritts, MD;  Location: [facility] ORS;  Service: Cardiovascular;  Laterality: N/A;  . ELECTROPHYSIOLOGIC STUDY  N/A 04/25/2016   Procedure: Cardioversion;  Surgeon: Minna Merritts, MD;  Location: [facility] ORS;  Service: Cardiovascular;  Laterality: N/A;  . INSERT / REPLACE / REMOVE PACEMAKER  2005   [employer]     Current Outpatient Prescriptions  Medication Sig Dispense Refill  . amitriptyline (ELAVIL) 25 MG tablet Take 25 mg by mouth every evening.  11  . amLODipine (NORVASC) 10 MG tablet Take 1 tablet (10 mg total) by mouth daily as needed. (Patient taking differently: Take 10 mg by mouth daily. ) 90 tablet 3  . atorvastatin (LIPITOR) 40 MG tablet Take 40 mg by mouth daily  at 6 PM.   11  . carvedilol (COREG) 3.125 MG tablet Take 1 tablet (3.125 mg total) by mouth 2 (two) times daily. 60 tablet 6  . Cholecalciferol (HM VITAMIN D3) 4000 UNITS CAPS Take 4,000 Units by mouth daily.    . furosemide (LASIX) 40 MG tablet Take 1 tablet (40 mg total) by mouth 2 (two) times daily. (Patient taking differently: Take 40 mg by mouth 2 (two) times daily as needed for fluid. ) 60 tablet 11  . HUMALOG 100 UNIT/ML injection Inject 10-14 Units into the skin See admin instructions. Inject 12 units subcutaneous in the morning with breakfast, 10 units subcutaneous at lunch, and 14 units subcutaneous at suppertime.  6  . isosorbide mononitrate (IMDUR) 30 MG 24 hr tablet Take 1 tablet (30 mg total) by mouth 2 (two) times daily. 60 tablet 11  . LANTUS 100 UNIT/ML injection Inject 14-35 Units into the skin See admin instructions. Inject 35 units subcutaneous once a day in the morning. Inject 14 units subcutaneous once a day at bedtime.  0  . losartan (COZAAR) 100 MG tablet Take 100 mg by mouth daily.  11  . Multiple Vitamins-Minerals (OCUTABS-LUTEIN) TABS Take 1 tablet by mouth daily.    . nitroGLYCERIN (NITROSTAT) 0.4 MG SL tablet Place 1 tablet (0.4 mg total) under the tongue every 5 (five) minutes as needed for chest pain. 30 tablet 0  . pantoprazole (PROTONIX) 40 MG tablet Take 40 mg by mouth every evening.   0  . POLY-IRON 150 150 MG capsule Take 150 mg by mouth daily.  3  . potassium chloride SA (K-DUR,KLOR-CON) 20 MEQ tablet TAKE 1 TABLET BY MOUTH TWICE DAILY 60 tablet 3  . PREVIDENT 5000 DRY MOUTH 1.1 % GEL dental gel Take by mouth daily. Brush teeth with paste for at least 1 minute once a day.  5  . sertraline (ZOLOFT) 100 MG tablet Take 100 mg by mouth at bedtime.   11  . vitamin C (ASCORBIC ACID) 500 MG tablet Take 500 mg by mouth daily.    Marland Kitchen warfarin (COUMADIN) 4 MG tablet TAKE AS DIRECTED BY COUMADIN CLINIC 50 tablet 3   No current facility-administered medications for this  encounter.     Allergies  Allergen Reactions  . Zosyn [Piperacillin Sod-Tazobactam So] Other (See Comments)    Renal failure    Social History   Social History  . Marital status: Divorced    Spouse name: N/A  . Number of children: N/A  . Years of education: N/A   Occupational History  . Not on file.   Social History Main Topics  . Smoking status: Never Smoker  . Smokeless tobacco: Never Used  . Alcohol use No  . Drug use: No  . Sexual activity: Not on file   Other Topics Concern  . Not on file   Social History  Narrative  . No narrative on file    Family History  Problem Relation Age of Onset  . CAD Mother   . Diabetes Mother   . Atrial fibrillation Mother   . Lung cancer Father   . Diabetes Father     ROS- All systems are reviewed and negative except as per the HPI above  Physical Exam: Vitals:   06/06/16 1459  BP: (!) 182/80  Pulse: (!) 56    GEN- The patient is well appearing, alert and oriented x 3 today.   Head- normocephalic, atraumatic Eyes-  Sclera clear, conjunctiva pink Ears- hearing intact Oropharynx- clear Neck- supple, no JVP Lymph- no cervical lymphadenopathy Lungs- Clear to ausculation bilaterally, normal work of breathing Heart- irregular rate and rhythm, no murmurs, rubs or gallops, PMI not laterally displaced GI- soft, NT, ND, + BS Extremities- no clubbing, cyanosis, or edema MS- no significant deformity or atrophy Skin- no rash or lesion Psych- euthymic mood, full affect Neuro- strength and sensation are intact  EKG-afib with svr at 56 bpm, LAD NSIVB, t wave abnormality qrs int 130 ms, qtc 463 ms Epic records reviewed  Assessment and Plan: 1.Persistent  Symptomatic afib  Pt has failed cardioversion x 3 Pt has failed amiodarone Discussed options to restore SR I have some concerns with tikosyn use, renal function, qtc prolonging drugs on board,qtc borderline for use  She does see Dr. Caryl Comes for her device and I will ask his  opinion  Will have to allow amiodarone to wash out for another 3 weeks, before tikosyn could be considered and would need weekly INR's x 3-4 weeks if used The daughter will look into the cost and let us know if can afford I don't think pt would be an optimal ablation candidate due to vascular issues which may limit access and renal function, due to contrast dye used but can be discussed with Dr. Rayann Heman if tikosyn deemed not to be a good option  I will be back in touch after the above concerns are addressed  Butch Penny C. Yakir Wenke, [facility] [address] [place], [place] [address] [phone]

## 2016-06-21 ENCOUNTER — Ambulatory Visit (INDEPENDENT_AMBULATORY_CARE_PROVIDER_SITE_OTHER): Payer: Medicare HMO

## 2016-06-21 ENCOUNTER — Ambulatory Visit (HOSPITAL_COMMUNITY)
Admission: RE | Admit: 2016-06-21 | Discharge: 2016-06-21 | Disposition: A | Discharge status: Home / Self Care | Payer: Medicare HMO | Source: Ambulatory Visit | Attending: Nurse Practitioner | Admitting: Nurse Practitioner

## 2016-06-21 ENCOUNTER — Encounter (HOSPITAL_COMMUNITY): Payer: Self-pay | Admitting: Nurse Practitioner

## 2016-06-21 VITALS — BP 166/72 | HR 62 | Ht 68.0 in

## 2016-06-21 DIAGNOSIS — R0683 Snoring: Secondary | ICD-10-CM | POA: Diagnosis not present

## 2016-06-21 DIAGNOSIS — Z7901 Long term (current) use of anticoagulants: Secondary | ICD-10-CM

## 2016-06-21 DIAGNOSIS — I129 Hypertensive chronic kidney disease with stage 1 through stage 4 chronic kidney disease, or unspecified chronic kidney disease: Secondary | ICD-10-CM | POA: Diagnosis not present

## 2016-06-21 DIAGNOSIS — E1122 Type 2 diabetes mellitus with diabetic chronic kidney disease: Secondary | ICD-10-CM | POA: Insufficient documentation

## 2016-06-21 DIAGNOSIS — K449 Diaphragmatic hernia without obstruction or gangrene: Secondary | ICD-10-CM | POA: Insufficient documentation

## 2016-06-21 DIAGNOSIS — Z89511 Acquired absence of right leg below knee: Secondary | ICD-10-CM | POA: Diagnosis not present

## 2016-06-21 DIAGNOSIS — Z8674 Personal history of sudden cardiac arrest: Secondary | ICD-10-CM | POA: Diagnosis not present

## 2016-06-21 DIAGNOSIS — Z89512 Acquired absence of left leg below knee: Secondary | ICD-10-CM | POA: Diagnosis not present

## 2016-06-21 DIAGNOSIS — E785 Hyperlipidemia, unspecified: Secondary | ICD-10-CM | POA: Insufficient documentation

## 2016-06-21 DIAGNOSIS — I2510 Atherosclerotic heart disease of native coronary artery without angina pectoris: Secondary | ICD-10-CM | POA: Insufficient documentation

## 2016-06-21 DIAGNOSIS — I5022 Chronic systolic (congestive) heart failure: Secondary | ICD-10-CM | POA: Diagnosis not present

## 2016-06-21 DIAGNOSIS — Z880 Allergy status to penicillin: Secondary | ICD-10-CM | POA: Diagnosis not present

## 2016-06-21 DIAGNOSIS — I4819 Other persistent atrial fibrillation: Secondary | ICD-10-CM

## 2016-06-21 DIAGNOSIS — Z794 Long term (current) use of insulin: Secondary | ICD-10-CM | POA: Insufficient documentation

## 2016-06-21 DIAGNOSIS — K219 Gastro-esophageal reflux disease without esophagitis: Secondary | ICD-10-CM | POA: Insufficient documentation

## 2016-06-21 DIAGNOSIS — Z23 Encounter for immunization: Secondary | ICD-10-CM | POA: Diagnosis not present

## 2016-06-21 DIAGNOSIS — Z79899 Other long term (current) drug therapy: Secondary | ICD-10-CM | POA: Diagnosis not present

## 2016-06-21 DIAGNOSIS — N184 Chronic kidney disease, stage 4 (severe): Secondary | ICD-10-CM | POA: Diagnosis not present

## 2016-06-21 DIAGNOSIS — I255 Ischemic cardiomyopathy: Secondary | ICD-10-CM | POA: Diagnosis not present

## 2016-06-21 DIAGNOSIS — I481 Persistent atrial fibrillation: Secondary | ICD-10-CM

## 2016-06-21 LAB — POCT INR: INR: 3.3

## 2016-06-21 NOTE — Patient Instructions (Signed)
Afib Ablation scheduled for Tuesday, November 7th  - Arrive at the [facility] and go to admitting at 7:30AM  -Do not eat or drink anything after midnight the night prior to your procedure.  - Take no medications the morning of the procedure.  - Be prepared to stay over night  Coumadin clinic will be in touch with you to set up appointments for weekly INRs. Have your lab work drawn at the [place] office within 2 weeks of the procedure.

## 2016-06-21 NOTE — Progress Notes (Signed)
Primary Care Physician: Pcp Not In System Primary Cardiologist: Dr Rockey Situ Primary Electrophysiologist: Dr Caryl Comes  Caroline Hernandez is a 70 y.o. female with a history of persistent atrial fibrillation who presents for follow up in the [facility].  She has refractory atrial fibrillation.  She has symptoms of fatigue and decreased exercise tolerance with her afib. + associated SOB.  She recently underwent cardioversion and felt "great" initially following the procedure.  Unfortunately she returned to afib despite medical therapy with amiodarone. Today, she denies symptoms of palpitations, chest pain,  orthopnea, PND, lower extremity edema, dizziness, presyncope, syncope, snoring, daytime somnolence, bleeding, or neurologic sequela. The patient is tolerating medications without difficulties and is otherwise without complaint today.    Atrial Fibrillation Risk Factors:  she does have symptoms or diagnosis of sleep apnea. (she snores but has not had sleep study)  she does not have a history of rheumatic fever.  she does not have a history of alcohol use.  she has a BMI of There is no height or weight on file to calculate BMI.. There were no vitals filed for this visit.  LA size: 19mm   Atrial Fibrillation Management history:  Previous antiarrhythmic drugs: amiodarone  Previous cardioversions: 1/17, 8/17  Previous ablations: none  CHADS2VASC score: 6  Anticoagulation history: coumadin   Past Medical History:  Diagnosis Date  . Amputation of left lower extremity below knee ([place])   . Amputation of right lower extremity below knee ([place])   . CAD (coronary artery disease)    a. 2004 Cardiac Arrest/CABG x 3 (LIMA->LAD, VG->OM, VG->RCA);  b. 06/2015 lexiscan MV: no significant ischemia, EF 48%, low risk->Med Rx.  . Chronic combined systolic and diastolic CHF (congestive heart failure) ([place])    a. 12/2014 Echo: EF 45-50%;  b. 08/2015 Echo: EF 45-50%, ant,  antsept HK, mildly dil LA, nl RV, mild-mod TR, sev PAH (33mmHg).  . CKD (chronic kidney disease), stage IV ([place])   . CLL (chronic lymphocytic leukemia) ([place])   . Diabetes mellitus without complication ([place])   . Essential hypertension   . GERD (gastroesophageal reflux disease)   . Hiatal hernia   . History of cardiac arrest    a. 2004.  Marland Kitchen Hyperlipidemia   . Ischemic cardiomyopathy    a. s/p MDT ICD (originally had 7824 lead-->gen change and lead revision ~ 2012 @ [facility]); b. 12/2014 Echo: EF 45-50%;  c.08/2015 Echo: EF 45-50%, ant, antsept HK. Nl RV.  Marland Kitchen PAD (peripheral artery disease) ([facility])    a. s/p BKA  . Persistent atrial fibrillation ([place])    a. Dx 12/2014.  CHA2DS2VASc = 6--> warfarin;  b. 09/2015 s/p DCCV-->on amio; c. s/p DCCV 04/25/16   Past Surgical History:  Procedure Laterality Date  . ABDOMINAL HYSTERECTOMY    . Amputation lower extremity bilaterally Bilateral   . CARDIAC CATHETERIZATION    . CORONARY ARTERY BYPASS GRAFT    . ELECTROPHYSIOLOGIC STUDY N/A 10/07/2015   Procedure: CARDIOVERSION;  Surgeon: Minna Merritts, MD;  Location: [facility] ORS;  Service: Cardiovascular;  Laterality: N/A;  . ELECTROPHYSIOLOGIC STUDY N/A 04/25/2016   Procedure: Cardioversion;  Surgeon: Minna Merritts, MD;  Location: [facility] ORS;  Service: Cardiovascular;  Laterality: N/A;  . INSERT / REPLACE / REMOVE PACEMAKER  2005   [employer]     Current Outpatient Prescriptions  Medication Sig Dispense Refill  . amitriptyline (ELAVIL) 25 MG tablet Take 25 mg by mouth every evening.  11  . amLODipine (NORVASC) 10  MG tablet Take 1 tablet (10 mg total) by mouth daily as needed. (Patient taking differently: Take 10 mg by mouth daily. ) 90 tablet 3  . atorvastatin (LIPITOR) 40 MG tablet Take 40 mg by mouth daily at 6 PM.   11  . carvedilol (COREG) 3.125 MG tablet Take 1 tablet (3.125 mg total) by mouth 2 (two) times daily. 60 tablet 6  . Cholecalciferol (HM VITAMIN D3) 4000 UNITS CAPS Take 4,000 Units by mouth  daily.    . furosemide (LASIX) 40 MG tablet Take 1 tablet (40 mg total) by mouth 2 (two) times daily. (Patient taking differently: Take 40 mg by mouth 2 (two) times daily. ) 60 tablet 11  . HUMALOG 100 UNIT/ML injection Inject 10-14 Units into the skin See admin instructions. Inject 12 units subcutaneous in the morning with breakfast, 10 units subcutaneous at lunch, and 14 units subcutaneous at suppertime.  6  . isosorbide mononitrate (IMDUR) 30 MG 24 hr tablet Take 1 tablet (30 mg total) by mouth 2 (two) times daily. 60 tablet 11  . LANTUS 100 UNIT/ML injection Inject 14-35 Units into the skin See admin instructions. Inject 35 units subcutaneous once a day in the morning. Inject 14 units subcutaneous once a day at bedtime.  0  . losartan (COZAAR) 100 MG tablet Take 100 mg by mouth daily.  11  . Multiple Vitamins-Minerals (OCUTABS-LUTEIN) TABS Take 1 tablet by mouth daily.    . nitroGLYCERIN (NITROSTAT) 0.4 MG SL tablet Place 1 tablet (0.4 mg total) under the tongue every 5 (five) minutes as needed for chest pain. 30 tablet 0  . pantoprazole (PROTONIX) 40 MG tablet Take 40 mg by mouth every evening.   0  . POLY-IRON 150 150 MG capsule Take 150 mg by mouth daily.  3  . potassium chloride SA (K-DUR,KLOR-CON) 20 MEQ tablet TAKE 1 TABLET BY MOUTH TWICE DAILY 60 tablet 3  . PREVIDENT 5000 DRY MOUTH 1.1 % GEL dental gel Take by mouth daily. Brush teeth with paste for at least 1 minute once a day.  5  . sertraline (ZOLOFT) 100 MG tablet Take 100 mg by mouth at bedtime.   11  . vitamin C (ASCORBIC ACID) 500 MG tablet Take 500 mg by mouth daily.    Marland Kitchen warfarin (COUMADIN) 4 MG tablet TAKE AS DIRECTED BY COUMADIN CLINIC 50 tablet 3   No current facility-administered medications for this encounter.     Allergies  Allergen Reactions  . Zosyn [Piperacillin Sod-Tazobactam So] Other (See Comments)    Renal failure    Social History   Social History  . Marital status: Divorced    Spouse name: N/A  .  Number of children: N/A  . Years of education: N/A   Occupational History  . Not on file.   Social History Main Topics  . Smoking status: Never Smoker  . Smokeless tobacco: Never Used  . Alcohol use No  . Drug use: No  . Sexual activity: Not on file   Other Topics Concern  . Not on file   Social History Narrative  . No narrative on file    Family History  Problem Relation Age of Onset  . CAD Mother   . Diabetes Mother   . Atrial fibrillation Mother   . Lung cancer Father   . Diabetes Father     ROS- All systems are reviewed and negative except as per the HPI above.  Physical Exam: Vitals:   06/21/16 1537  BP: Marland Kitchen)  166/72  Pulse: 62  Height: 5\' 8"  (1.727 m)    GEN- The patient is overweight, chronically ill appearing, alert and oriented x 3 today.   Head- normocephalic, atraumatic Eyes-  Sclera clear, conjunctiva pink Ears- hearing intact Oropharynx- clear Neck- supple  Lungs- Clear to ausculation bilaterally, normal work of breathing Heart- irregular rate and rhythm,   GI- soft, NT, ND, + BS Extremities- no clubbing, cyanosis, or edema MS- s/p bilateral BKA Skin- no rash or lesion Psych- euthymic mood, full affect Neuro- strength and sensation are intact  Wt Readings from Last 3 Encounters:  05/30/16 188 lb (85.3 kg)  04/25/16 187 lb (84.8 kg)  04/19/16 187 lb (84.8 kg)    EKG today demonstrates afib, V rate 62 bpm, incomplete LBBB (QRS 132 msec), QTc 499 msec Echo 12/16 demonstrated EF 45-50%, mildly dilated LA, mild to moderate TR, severe PA pressure elevation (60 mm Hg)  Epic records are reviewed at length today  Assessment and Plan:  1. Persistent atrial fibrillation The patient has very symptomatic persistent afib.  She has failed medical therapy with amiodarone. Unfortunately, our AAD options are limited by renal failure.  I really do not think that she has good options. Recent CASTLE AF trial would suggest possible improvement in mortality  and reduced hospitalization with ablation.  Therapeutic strategies for afib including medicine and ablation were discussed in detail with the patient today. Risk, benefits, and alternatives to EP study and radiofrequency ablation for afib were also discussed in detail today. These risks include but are not limited to stroke, bleeding, vascular damage, tamponade, perforation, damage to the esophagus, lungs, and other structures, pulmonary vein stenosis, worsening renal function, and death. The patient understands these risk and wishes to proceed.  We will therefore proceed with catheter ablation at the next available time.  She will need weekly INRs prior to ablation.  I will try to avoid contrast with ablation given renal failure.  Will obtain TEE prior to ablation to evaluate significantly elevated pulmonary pressures seen previously on echo and to exclude LAA thrombus. Continue coumadin for chads2vasc score of 6.  2. Snoring Given severely elevated PA pressures and symptoms of snoring, I have advised sleep study.  3. Chronic systolic dysfunction/ ischemic CM/ CD No ischemic symptoms euvolemic on exam Continue current medicines  4. CRI Avoid contrast as able with ablation   Thompson Grayer, MD 06/21/2016 11:16 PM

## 2016-06-22 ENCOUNTER — Other Ambulatory Visit (HOSPITAL_COMMUNITY): Payer: Self-pay | Admitting: *Deleted

## 2016-06-22 DIAGNOSIS — I4819 Other persistent atrial fibrillation: Secondary | ICD-10-CM

## 2016-06-23 ENCOUNTER — Telehealth: Payer: Self-pay | Admitting: *Deleted

## 2016-06-23 NOTE — Telephone Encounter (Signed)
Prior Authorization started for sleep study   Pending clinical review with [facility] REF # [id number]

## 2016-06-26 ENCOUNTER — Other Ambulatory Visit: Payer: Self-pay | Admitting: Cardiovascular Disease

## 2016-06-28 ENCOUNTER — Ambulatory Visit (INDEPENDENT_AMBULATORY_CARE_PROVIDER_SITE_OTHER): Payer: Medicare HMO | Admitting: *Deleted

## 2016-06-28 DIAGNOSIS — I481 Persistent atrial fibrillation: Secondary | ICD-10-CM

## 2016-06-28 DIAGNOSIS — Z7901 Long term (current) use of anticoagulants: Secondary | ICD-10-CM | POA: Diagnosis not present

## 2016-06-28 DIAGNOSIS — I4819 Other persistent atrial fibrillation: Secondary | ICD-10-CM

## 2016-06-28 LAB — POCT INR: INR: 2.9

## 2016-07-05 ENCOUNTER — Ambulatory Visit (INDEPENDENT_AMBULATORY_CARE_PROVIDER_SITE_OTHER): Payer: Medicare HMO

## 2016-07-05 DIAGNOSIS — Z7901 Long term (current) use of anticoagulants: Secondary | ICD-10-CM

## 2016-07-05 DIAGNOSIS — I481 Persistent atrial fibrillation: Secondary | ICD-10-CM

## 2016-07-05 DIAGNOSIS — I4819 Other persistent atrial fibrillation: Secondary | ICD-10-CM

## 2016-07-05 LAB — POCT INR: INR: 3

## 2016-07-06 ENCOUNTER — Other Ambulatory Visit: Payer: Self-pay | Admitting: *Deleted

## 2016-07-06 DIAGNOSIS — G4733 Obstructive sleep apnea (adult) (pediatric): Secondary | ICD-10-CM

## 2016-07-06 NOTE — Telephone Encounter (Signed)
Sleep Study Approved through 10/03/2016    Auth # [id number]

## 2016-07-12 ENCOUNTER — Ambulatory Visit (INDEPENDENT_AMBULATORY_CARE_PROVIDER_SITE_OTHER): Payer: Medicare HMO

## 2016-07-12 ENCOUNTER — Other Ambulatory Visit: Payer: Medicare HMO

## 2016-07-12 ENCOUNTER — Other Ambulatory Visit
Admission: RE | Admit: 2016-07-12 | Discharge: 2016-07-12 | Disposition: A | Discharge status: Home / Self Care | Payer: Medicare HMO | Source: Ambulatory Visit | Attending: Internal Medicine | Admitting: Internal Medicine

## 2016-07-12 ENCOUNTER — Other Ambulatory Visit (HOSPITAL_COMMUNITY): Payer: Self-pay | Admitting: *Deleted

## 2016-07-12 DIAGNOSIS — Z7901 Long term (current) use of anticoagulants: Secondary | ICD-10-CM

## 2016-07-12 DIAGNOSIS — I4819 Other persistent atrial fibrillation: Secondary | ICD-10-CM

## 2016-07-12 DIAGNOSIS — I481 Persistent atrial fibrillation: Secondary | ICD-10-CM | POA: Insufficient documentation

## 2016-07-12 LAB — CBC WITH DIFFERENTIAL/PLATELET
BASOS ABS: 0 10*3/uL (ref 0–0.1)
Basophils Relative: 0 %
EOS ABS: 0.3 10*3/uL (ref 0–0.7)
Eosinophils Relative: 1 %
HCT: 40.6 % (ref 35.0–47.0)
Hemoglobin: 12.9 g/dL (ref 12.0–16.0)
LYMPHS ABS: 24.4 10*3/uL — AB (ref 1.0–3.6)
Lymphocytes Relative: 77 %
MCH: 27.5 pg (ref 26.0–34.0)
MCHC: 31.7 g/dL — ABNORMAL LOW (ref 32.0–36.0)
MCV: 86.8 fL (ref 80.0–100.0)
MONO ABS: 0.9 10*3/uL (ref 0.2–0.9)
Monocytes Relative: 3 %
Neutro Abs: 6 10*3/uL (ref 1.4–6.5)
Neutrophils Relative %: 19 %
PLATELETS: 157 10*3/uL (ref 150–440)
RBC: 4.68 MIL/uL (ref 3.80–5.20)
RDW: 16.2 % — AB (ref 11.5–14.5)
WBC: 31.6 10*3/uL — AB (ref 3.6–11.0)

## 2016-07-12 LAB — POCT INR: INR: 3.1

## 2016-07-12 LAB — BASIC METABOLIC PANEL
ANION GAP: 8 (ref 5–15)
BUN: 32 mg/dL — ABNORMAL HIGH (ref 6–20)
CALCIUM: 9 mg/dL (ref 8.9–10.3)
CO2: 26 mmol/L (ref 22–32)
Chloride: 101 mmol/L (ref 101–111)
Creatinine, Ser: 2.1 mg/dL — ABNORMAL HIGH (ref 0.44–1.00)
GFR, EST AFRICAN AMERICAN: 27 mL/min — AB (ref >60)
GFR, EST NON AFRICAN AMERICAN: 23 mL/min — AB (ref >60)
GLUCOSE: 173 mg/dL — AB (ref 65–99)
Potassium: 4.2 mmol/L (ref 3.5–5.1)
Sodium: 135 mmol/L (ref 135–145)

## 2016-07-13 ENCOUNTER — Telehealth: Payer: Self-pay | Admitting: Pharmacist

## 2016-07-13 NOTE — Telephone Encounter (Signed)
-----   Message from Juluis Mire, RN sent at 07/13/2016  3:23 PM EDT ----- Regarding: FW: weekly inr prior to ablation Not sure who should man this. Pt is followed in [place]. I sent a message this morning but haven't heard anything. I just want to ensure pts procedure isn't canceled for 11/7 bc her INR is too thin. Thanks Stacy ----- Message ----- From: Juluis Mire, RN Sent: 07/13/2016   8:28 AM To: Britt Bolognese Div Burl Subject: FW: weekly inr prior to ablation               Please see below for pts upcoming ablation. INR needs to be checked 11/6. Please contact pt and adjust coumadin so that INR is appropriate for procedure. Thanks! Stacy RN Afib ----- Message ----- From: Dionicio Stall, RN Sent: 07/12/2016   5:16 PM To: Juluis Mire, RN Subject: RE: weekly inr prior to ablation               Needs to be lower  Between 2.5-2.7.  If 3.0 will cancel procedure.  Draw 07/17/16 per [facility] but needs to be < 3.0 ----- Message ----- From: Juluis Mire, RN Sent: 07/12/2016   3:44 PM To: Dionicio Stall, RN Subject: weekly inr prior to ablation                   Pt is followed with coumadin clinic in [place]. Scheduled for ablation 11/7.  INR 10/11 -- 3.3 INR 10/18 -- 2.9 INR 10/25 -- 3.0 INR 11/1 -- 3.1  Looks like they only scheduled follow up after ablation INR check. Is it ok to just have INR checked morning of ablation or should she go in on 11/6 and be rechecked? Marzetta Board

## 2016-07-13 NOTE — Telephone Encounter (Signed)
Patient will have her INR checked in [facility] with a nurse visit on Monday 11/6 prior to ablation 11/7. They will call [address] for dosing instructions. Per Dr Rayann Heman, INR needs to ideally be between 2.5 and 2.7, definitely < 3. Since pt's INR has been running 3-3.2, advised her to take 1/2 her usual dose Sunday night prior to Monday INR check.

## 2016-07-17 ENCOUNTER — Telehealth: Payer: Self-pay | Admitting: Cardiovascular Disease

## 2016-07-17 ENCOUNTER — Ambulatory Visit (INDEPENDENT_AMBULATORY_CARE_PROVIDER_SITE_OTHER): Payer: Medicare HMO

## 2016-07-17 DIAGNOSIS — Z7901 Long term (current) use of anticoagulants: Secondary | ICD-10-CM | POA: Diagnosis not present

## 2016-07-17 DIAGNOSIS — I481 Persistent atrial fibrillation: Secondary | ICD-10-CM | POA: Diagnosis not present

## 2016-07-17 DIAGNOSIS — I4819 Other persistent atrial fibrillation: Secondary | ICD-10-CM

## 2016-07-17 LAB — POCT INR: INR: 2.9

## 2016-07-17 NOTE — Telephone Encounter (Signed)
Pt in office today for coumadin check as she has ablation scheduled Nov 7. INR 2.9 today. S/w Erica in coumadin clinic for dosing instructions. Erica instructed pt to continue same dose coumadin and eat extra greens today. Pt and daughter verbalized understanding.  Recheck 11/15 has already been scheduled.

## 2016-07-18 ENCOUNTER — Ambulatory Visit (HOSPITAL_COMMUNITY)
Admission: RE | Admit: 2016-07-18 | Discharge: 2016-07-19 | Disposition: A | Discharge status: Home / Self Care | Payer: Medicare HMO | Source: Ambulatory Visit | Attending: Internal Medicine | Admitting: Internal Medicine

## 2016-07-18 ENCOUNTER — Encounter (HOSPITAL_COMMUNITY)
Admission: RE | Disposition: A | Discharge status: Home / Self Care | Payer: Self-pay | Source: Ambulatory Visit | Attending: Internal Medicine

## 2016-07-18 ENCOUNTER — Ambulatory Visit (HOSPITAL_BASED_OUTPATIENT_CLINIC_OR_DEPARTMENT_OTHER): Payer: Medicare HMO

## 2016-07-18 ENCOUNTER — Ambulatory Visit (HOSPITAL_COMMUNITY): Payer: Medicare HMO | Admitting: Anesthesiology

## 2016-07-18 ENCOUNTER — Encounter (HOSPITAL_COMMUNITY): Payer: Self-pay

## 2016-07-18 DIAGNOSIS — I2510 Atherosclerotic heart disease of native coronary artery without angina pectoris: Secondary | ICD-10-CM | POA: Diagnosis not present

## 2016-07-18 DIAGNOSIS — Z833 Family history of diabetes mellitus: Secondary | ICD-10-CM | POA: Insufficient documentation

## 2016-07-18 DIAGNOSIS — Z8674 Personal history of sudden cardiac arrest: Secondary | ICD-10-CM | POA: Diagnosis not present

## 2016-07-18 DIAGNOSIS — Z7901 Long term (current) use of anticoagulants: Secondary | ICD-10-CM | POA: Insufficient documentation

## 2016-07-18 DIAGNOSIS — Z951 Presence of aortocoronary bypass graft: Secondary | ICD-10-CM | POA: Insufficient documentation

## 2016-07-18 DIAGNOSIS — Z8249 Family history of ischemic heart disease and other diseases of the circulatory system: Secondary | ICD-10-CM | POA: Diagnosis not present

## 2016-07-18 DIAGNOSIS — Z9889 Other specified postprocedural states: Secondary | ICD-10-CM | POA: Diagnosis not present

## 2016-07-18 DIAGNOSIS — I481 Persistent atrial fibrillation: Secondary | ICD-10-CM | POA: Insufficient documentation

## 2016-07-18 DIAGNOSIS — C9110 Chronic lymphocytic leukemia of B-cell type not having achieved remission: Secondary | ICD-10-CM | POA: Insufficient documentation

## 2016-07-18 DIAGNOSIS — E875 Hyperkalemia: Secondary | ICD-10-CM | POA: Diagnosis not present

## 2016-07-18 DIAGNOSIS — K219 Gastro-esophageal reflux disease without esophagitis: Secondary | ICD-10-CM | POA: Insufficient documentation

## 2016-07-18 DIAGNOSIS — I255 Ischemic cardiomyopathy: Secondary | ICD-10-CM | POA: Insufficient documentation

## 2016-07-18 DIAGNOSIS — I340 Nonrheumatic mitral (valve) insufficiency: Secondary | ICD-10-CM

## 2016-07-18 DIAGNOSIS — Z794 Long term (current) use of insulin: Secondary | ICD-10-CM | POA: Diagnosis not present

## 2016-07-18 DIAGNOSIS — Z950 Presence of cardiac pacemaker: Secondary | ICD-10-CM | POA: Diagnosis not present

## 2016-07-18 DIAGNOSIS — N184 Chronic kidney disease, stage 4 (severe): Secondary | ICD-10-CM | POA: Insufficient documentation

## 2016-07-18 DIAGNOSIS — I130 Hypertensive heart and chronic kidney disease with heart failure and stage 1 through stage 4 chronic kidney disease, or unspecified chronic kidney disease: Secondary | ICD-10-CM | POA: Insufficient documentation

## 2016-07-18 DIAGNOSIS — I4891 Unspecified atrial fibrillation: Secondary | ICD-10-CM

## 2016-07-18 DIAGNOSIS — E1122 Type 2 diabetes mellitus with diabetic chronic kidney disease: Secondary | ICD-10-CM | POA: Insufficient documentation

## 2016-07-18 DIAGNOSIS — Z90710 Acquired absence of both cervix and uterus: Secondary | ICD-10-CM | POA: Diagnosis not present

## 2016-07-18 DIAGNOSIS — I4819 Other persistent atrial fibrillation: Secondary | ICD-10-CM

## 2016-07-18 DIAGNOSIS — K449 Diaphragmatic hernia without obstruction or gangrene: Secondary | ICD-10-CM | POA: Insufficient documentation

## 2016-07-18 DIAGNOSIS — E785 Hyperlipidemia, unspecified: Secondary | ICD-10-CM | POA: Diagnosis not present

## 2016-07-18 DIAGNOSIS — Z881 Allergy status to other antibiotic agents status: Secondary | ICD-10-CM | POA: Diagnosis not present

## 2016-07-18 DIAGNOSIS — I5042 Chronic combined systolic (congestive) and diastolic (congestive) heart failure: Secondary | ICD-10-CM | POA: Diagnosis not present

## 2016-07-18 DIAGNOSIS — Z89511 Acquired absence of right leg below knee: Secondary | ICD-10-CM | POA: Diagnosis not present

## 2016-07-18 DIAGNOSIS — E1151 Type 2 diabetes mellitus with diabetic peripheral angiopathy without gangrene: Secondary | ICD-10-CM | POA: Diagnosis not present

## 2016-07-18 DIAGNOSIS — Z801 Family history of malignant neoplasm of trachea, bronchus and lung: Secondary | ICD-10-CM | POA: Insufficient documentation

## 2016-07-18 DIAGNOSIS — Z89512 Acquired absence of left leg below knee: Secondary | ICD-10-CM | POA: Insufficient documentation

## 2016-07-18 HISTORY — PX: ELECTROPHYSIOLOGIC STUDY: SHX172A

## 2016-07-18 HISTORY — PX: TEE WITHOUT CARDIOVERSION: SHX5443

## 2016-07-18 LAB — PROTIME-INR
INR: 2.11
PROTHROMBIN TIME: 24 s — AB (ref 11.4–15.2)

## 2016-07-18 LAB — GLUCOSE, CAPILLARY
Glucose-Capillary: 170 mg/dL — ABNORMAL HIGH (ref 65–99)
Glucose-Capillary: 157 mg/dL — ABNORMAL HIGH (ref 65–99)

## 2016-07-18 LAB — POCT ACTIVATED CLOTTING TIME
ACTIVATED CLOTTING TIME: 274 s
ACTIVATED CLOTTING TIME: 313 s
ACTIVATED CLOTTING TIME: 197 s
Activated Clotting Time: 318 seconds
Activated Clotting Time: 197 seconds
Activated Clotting Time: 197 seconds

## 2016-07-18 SURGERY — ATRIAL FIBRILLATION ABLATION
Anesthesia: General

## 2016-07-18 SURGERY — ECHOCARDIOGRAM, TRANSESOPHAGEAL
Anesthesia: Moderate Sedation

## 2016-07-18 MED ORDER — FUROSEMIDE 40 MG PO TABS
40.0000 mg | ORAL_TABLET | Freq: Two times a day (BID) | ORAL | Status: DC
  Administered 2016-07-19: 40 mg via ORAL
  Filled 2016-07-18: qty 1

## 2016-07-18 MED ORDER — PROPOFOL 500 MG/50ML IV EMUL
INTRAVENOUS | Status: DC | PRN
  Administered 2016-07-18: 25 ug/kg/min via INTRAVENOUS

## 2016-07-18 MED ORDER — SODIUM CHLORIDE 0.9 % IV SOLN
INTRAVENOUS | Status: DC | PRN
  Administered 2016-07-18 (×2): via INTRAVENOUS

## 2016-07-18 MED ORDER — INSULIN ASPART 100 UNIT/ML ~~LOC~~ SOLN
10.0000 [IU] | Freq: Every day | SUBCUTANEOUS | Status: DC
  Administered 2016-07-19: 10 [IU] via SUBCUTANEOUS

## 2016-07-18 MED ORDER — HYDROCODONE-ACETAMINOPHEN 5-325 MG PO TABS
1.0000 | ORAL_TABLET | ORAL | Status: DC | PRN
  Administered 2016-07-18: 1 via ORAL
  Filled 2016-07-18: qty 1

## 2016-07-18 MED ORDER — POLYSACCHARIDE IRON COMPLEX 150 MG PO CAPS
150.0000 mg | ORAL_CAPSULE | Freq: Every day | ORAL | Status: DC
  Administered 2016-07-19: 150 mg via ORAL
  Filled 2016-07-18: qty 1

## 2016-07-18 MED ORDER — PANTOPRAZOLE SODIUM 40 MG PO TBEC
40.0000 mg | DELAYED_RELEASE_TABLET | Freq: Every evening | ORAL | Status: DC
  Administered 2016-07-18: 40 mg via ORAL
  Filled 2016-07-18: qty 1

## 2016-07-18 MED ORDER — IOPAMIDOL (ISOVUE-370) INJECTION 76%
INTRAVENOUS | Status: AC
  Filled 2016-07-18: qty 50

## 2016-07-18 MED ORDER — PROPOFOL 10 MG/ML IV BOLUS
INTRAVENOUS | Status: DC | PRN
  Administered 2016-07-18: 130 mg via INTRAVENOUS

## 2016-07-18 MED ORDER — PHENYLEPHRINE HCL 10 MG/ML IJ SOLN
INTRAVENOUS | Status: DC | PRN
  Administered 2016-07-18: 10 ug/min via INTRAVENOUS

## 2016-07-18 MED ORDER — INSULIN ASPART 100 UNIT/ML ~~LOC~~ SOLN: 10.0000 [IU] | Freq: Every day | SUBCUTANEOUS | Status: DC

## 2016-07-18 MED ORDER — GLYCOPYRROLATE 0.2 MG/ML IJ SOLN: INTRAMUSCULAR | Status: DC | PRN

## 2016-07-18 MED ORDER — FENTANYL CITRATE (PF) 100 MCG/2ML IJ SOLN
INTRAMUSCULAR | Status: DC | PRN
  Administered 2016-07-18: 100 ug via INTRAVENOUS
  Administered 2016-07-18 (×2): 25 ug via INTRAVENOUS

## 2016-07-18 MED ORDER — MIDAZOLAM HCL 5 MG/ML IJ SOLN
INTRAMUSCULAR | Status: AC
  Filled 2016-07-18: qty 2

## 2016-07-18 MED ORDER — AMITRIPTYLINE HCL 25 MG PO TABS
25.0000 mg | ORAL_TABLET | Freq: Every evening | ORAL | Status: DC
  Administered 2016-07-18: 25 mg via ORAL
  Filled 2016-07-18 (×2): qty 1

## 2016-07-18 MED ORDER — PROTAMINE SULFATE 10 MG/ML IV SOLN
INTRAVENOUS | Status: DC | PRN
  Administered 2016-07-18: 30 mg via INTRAVENOUS

## 2016-07-18 MED ORDER — SERTRALINE HCL 100 MG PO TABS
100.0000 mg | ORAL_TABLET | Freq: Every day | ORAL | Status: DC
  Administered 2016-07-18: 100 mg via ORAL
  Filled 2016-07-18: qty 1

## 2016-07-18 MED ORDER — BUPIVACAINE HCL (PF) 0.25 % IJ SOLN
INTRAMUSCULAR | Status: AC
  Filled 2016-07-18: qty 30

## 2016-07-18 MED ORDER — POTASSIUM CHLORIDE CRYS ER 20 MEQ PO TBCR: 20.0000 meq | EXTENDED_RELEASE_TABLET | Freq: Two times a day (BID) | ORAL | Status: DC

## 2016-07-18 MED ORDER — ONDANSETRON HCL 4 MG/2ML IJ SOLN: 4.0000 mg | Freq: Four times a day (QID) | INTRAMUSCULAR | Status: DC | PRN

## 2016-07-18 MED ORDER — HEPARIN SODIUM (PORCINE) 1000 UNIT/ML IJ SOLN
INTRAMUSCULAR | Status: DC | PRN
  Administered 2016-07-18: 4000 [IU] via INTRAVENOUS
  Administered 2016-07-18: 2000 [IU] via INTRAVENOUS
  Administered 2016-07-18: 10000 [IU] via INTRAVENOUS

## 2016-07-18 MED ORDER — BUPIVACAINE HCL (PF) 0.25 % IJ SOLN
INTRAMUSCULAR | Status: DC | PRN
  Administered 2016-07-18: 30 mL

## 2016-07-18 MED ORDER — MIDAZOLAM HCL 10 MG/2ML IJ SOLN
INTRAMUSCULAR | Status: DC | PRN
Start: 2016-07-18 — End: 2016-07-18
  Administered 2016-07-18: 2 mg via INTRAVENOUS
  Administered 2016-07-18 (×2): 1 mg via INTRAVENOUS

## 2016-07-18 MED ORDER — INSULIN LISPRO 100 UNIT/ML ~~LOC~~ SOLN: 10.0000 [IU] | SUBCUTANEOUS | Status: DC

## 2016-07-18 MED ORDER — DEXAMETHASONE SODIUM PHOSPHATE 10 MG/ML IJ SOLN
INTRAMUSCULAR | Status: DC | PRN
  Administered 2016-07-18: 5 mg via INTRAVENOUS

## 2016-07-18 MED ORDER — SODIUM CHLORIDE 0.9 % IV SOLN: 250.0000 mL | INTRAVENOUS | Status: DC | PRN

## 2016-07-18 MED ORDER — INSULIN ASPART 100 UNIT/ML ~~LOC~~ SOLN
5.0000 [IU] | Freq: Every day | SUBCUTANEOUS | Status: DC
  Administered 2016-07-19: 5 [IU] via SUBCUTANEOUS

## 2016-07-18 MED ORDER — INSULIN GLARGINE 100 UNIT/ML ~~LOC~~ SOLN: 14.0000 [IU] | SUBCUTANEOUS | Status: DC

## 2016-07-18 MED ORDER — FENTANYL CITRATE (PF) 100 MCG/2ML IJ SOLN
INTRAMUSCULAR | Status: AC
  Filled 2016-07-18: qty 2

## 2016-07-18 MED ORDER — WARFARIN - PHYSICIAN DOSING INPATIENT
Freq: Every day | Status: DC
  Administered 2016-07-18: 19:00:00

## 2016-07-18 MED ORDER — ACETAMINOPHEN 325 MG PO TABS: 650.0000 mg | ORAL_TABLET | ORAL | Status: DC | PRN

## 2016-07-18 MED ORDER — SODIUM CHLORIDE 0.9 % IV SOLN: INTRAVENOUS | Status: DC

## 2016-07-18 MED ORDER — LOSARTAN POTASSIUM 50 MG PO TABS
100.0000 mg | ORAL_TABLET | Freq: Every day | ORAL | Status: DC
  Administered 2016-07-19: 100 mg via ORAL
  Filled 2016-07-18: qty 2

## 2016-07-18 MED ORDER — INSULIN GLARGINE 100 UNIT/ML ~~LOC~~ SOLN
14.0000 [IU] | Freq: Every day | SUBCUTANEOUS | Status: DC
  Administered 2016-07-18: 14 [IU] via SUBCUTANEOUS
  Filled 2016-07-18 (×2): qty 0.14

## 2016-07-18 MED ORDER — HEPARIN SODIUM (PORCINE) 1000 UNIT/ML IJ SOLN
INTRAMUSCULAR | Status: DC | PRN
  Administered 2016-07-18: 1000 [IU] via INTRAVENOUS

## 2016-07-18 MED ORDER — ISOSORBIDE MONONITRATE ER 30 MG PO TB24
30.0000 mg | ORAL_TABLET | Freq: Every day | ORAL | Status: DC
  Administered 2016-07-19: 30 mg via ORAL
  Filled 2016-07-18: qty 1

## 2016-07-18 MED ORDER — ONDANSETRON HCL 4 MG/2ML IJ SOLN
INTRAMUSCULAR | Status: DC | PRN
Start: 2016-07-18 — End: 2016-07-18
  Administered 2016-07-18: 4 mg via INTRAVENOUS

## 2016-07-18 MED ORDER — IOPAMIDOL (ISOVUE-370) INJECTION 76%
INTRAVENOUS | Status: DC | PRN
  Administered 2016-07-18: 3 mL via INTRAVENOUS

## 2016-07-18 MED ORDER — BUTAMBEN-TETRACAINE-BENZOCAINE 2-2-14 % EX AERO
INHALATION_SPRAY | CUTANEOUS | Status: DC | PRN
  Administered 2016-07-18: 2 via TOPICAL

## 2016-07-18 MED ORDER — PHENYLEPHRINE HCL 10 MG/ML IJ SOLN: INTRAMUSCULAR | Status: DC | PRN

## 2016-07-18 MED ORDER — WARFARIN SODIUM 5 MG PO TABS
5.0000 mg | ORAL_TABLET | Freq: Once | ORAL | Status: AC
  Administered 2016-07-18: 5 mg via ORAL
  Filled 2016-07-18: qty 1

## 2016-07-18 MED ORDER — LIDOCAINE HCL (CARDIAC) 20 MG/ML IV SOLN
INTRAVENOUS | Status: DC | PRN
  Administered 2016-07-18: 100 mg via INTRAVENOUS

## 2016-07-18 MED ORDER — SODIUM CHLORIDE 0.9% FLUSH: 3.0000 mL | INTRAVENOUS | Status: DC | PRN

## 2016-07-18 MED ORDER — FENTANYL CITRATE (PF) 100 MCG/2ML IJ SOLN
INTRAMUSCULAR | Status: DC | PRN
  Administered 2016-07-18: 12.5 ug via INTRAVENOUS
  Administered 2016-07-18: 25 ug via INTRAVENOUS
  Administered 2016-07-18: 12.5 ug via INTRAVENOUS

## 2016-07-18 MED ORDER — INSULIN GLARGINE 100 UNIT/ML ~~LOC~~ SOLN
35.0000 [IU] | Freq: Every day | SUBCUTANEOUS | Status: DC
  Administered 2016-07-19: 35 [IU] via SUBCUTANEOUS
  Filled 2016-07-18 (×2): qty 0.35

## 2016-07-18 MED ORDER — MIDAZOLAM HCL 5 MG/5ML IJ SOLN
INTRAMUSCULAR | Status: DC | PRN
  Administered 2016-07-18 (×2): 1 mg via INTRAVENOUS

## 2016-07-18 MED ORDER — SODIUM CHLORIDE 0.9% FLUSH
3.0000 mL | Freq: Two times a day (BID) | INTRAVENOUS | Status: DC
  Administered 2016-07-18 – 2016-07-19 (×2): 3 mL via INTRAVENOUS

## 2016-07-18 SURGICAL SUPPLY — 18 items
BAG SNAP BAND KOVER 36X36 (MISCELLANEOUS) ×3 IMPLANT
BLANKET WARM UNDERBOD FULL ACC (MISCELLANEOUS) ×3 IMPLANT
CATH NAVISTAR SMARTTOUCH DF (ABLATOR) ×3 IMPLANT
CATH SOUNDSTAR 3D IMAGING (CATHETERS) ×3 IMPLANT
CATH VARIABLE LASSO NAV 2515 (CATHETERS) ×3 IMPLANT
CATH WEBSTER BI DIR CS D-F CRV (CATHETERS) ×3 IMPLANT
COVER SWIFTLINK CONNECTOR (BAG) ×3 IMPLANT
NEEDLE TRANSEP BRK 71CM 407200 (NEEDLE) ×3 IMPLANT
PACK EP LATEX FREE (CUSTOM PROCEDURE TRAY) ×2
PACK EP LF (CUSTOM PROCEDURE TRAY) ×1 IMPLANT
PAD DEFIB LIFELINK (PAD) ×3 IMPLANT
PATCH CARTO3 (PAD) ×3 IMPLANT
SHEATH AVANTI 11F 11CM (SHEATH) ×3 IMPLANT
SHEATH PINNACLE 7F 10CM (SHEATH) ×6 IMPLANT
SHEATH PINNACLE 9F 10CM (SHEATH) ×3 IMPLANT
SHEATH SWARTZ TS SL2 63CM 8.5F (SHEATH) ×3 IMPLANT
SHIELD RADPAD SCOOP 12X17 (MISCELLANEOUS) ×3 IMPLANT
TUBING SMART ABLATE COOLFLOW (TUBING) ×3 IMPLANT

## 2016-07-18 NOTE — Anesthesia Preprocedure Evaluation (Signed)
Anesthesia Evaluation  Patient identified by MRN, date of birth, ID band Patient awake    Reviewed: Allergy & Precautions, NPO status , Patient's Chart, lab work & pertinent test results  History of Anesthesia Complications Negative for: history of anesthetic complications  Airway Mallampati: III  TM Distance: >3 FB Neck ROM: Full    Dental no notable dental hx. (+) Dental Advisory Given   Pulmonary neg pulmonary ROS,    Pulmonary exam normal        Cardiovascular hypertension, + angina + CAD, + CABG, + Peripheral Vascular Disease and +CHF  + pacemaker + Cardiac Defibrillator  Rhythm:Irregular Rate:Normal     Neuro/Psych negative neurological ROS  negative psych ROS   GI/Hepatic hiatal hernia, GERD  ,  Endo/Other  diabetes  Renal/GU Renal InsufficiencyRenal disease     Musculoskeletal   Abdominal   Peds  Hematology   Anesthesia Other Findings   Reproductive/Obstetrics                             Anesthesia Physical Anesthesia Plan  ASA: III  Anesthesia Plan: General   Post-op Pain Management:    Induction: Intravenous  Airway Management Planned: LMA  Additional Equipment:   Intra-op Plan:   Post-operative Plan: Extubation in OR  Informed Consent: I have reviewed the patients History and Physical, chart, labs and discussed the procedure including the risks, benefits and alternatives for the proposed anesthesia with the patient or authorized representative who has indicated his/her understanding and acceptance.   Dental advisory given  Plan Discussed with: CRNA, Anesthesiologist and Surgeon  Anesthesia Plan Comments:         Anesthesia Quick Evaluation

## 2016-07-18 NOTE — CV Procedure (Addendum)
    PROCEDURE NOTE:  Procedure:  Transesophageal echocardiogram Operator:  Fransico Him, MD Indications:  Atrial Fibrillation Complications: None  During this procedure the patient is administered a total of Versed 4 mg and Fentanyl 50 mg to achieve and maintain moderate conscious sedation.  The patient's heart rate, blood pressure, and oxygen saturation are monitored continuously during the procedure. The period of conscious sedation is 21 minutes, of which I was present face-to-face 100% of this time.  Results: Normal LV size and low normal LV function with EF 40-45% Normal RV size and function with pacer wire noted. Normal RA with no evidence of thrombus.  A pacer wire was noted in the RA cavity. Mildly dilated  LA with no evidence of thrombus.  The LA appendage is normal in size with mild spontaneous echo contrast but no evidence of thrombus.  The emptying velocity was reduced.  Normal TV with mild to moderate TR. Normal PV  Normal MV with mild MR The AV was trileaflet with moderately thickened and mildly calcified right coronary cusp.   Lipomatous interatrial septum with hypermobile interatrial septum but no evidence of shunt by colorflow dopper  Moderate atheromatous plaque in the thoracic and ascending aorta.  The patient tolerated the procedure well and was transferred back to their room in stable condition.  Signed: Fransico Him, MD [facility]

## 2016-07-18 NOTE — Progress Notes (Signed)
  Echocardiogram 2D Echocardiogram has been performed.  Donata Clay 07/18/2016, 9:59 AM

## 2016-07-18 NOTE — Interval H&P Note (Signed)
History and Physical Interval Note:  07/18/2016 8:11 AM  Caroline Hernandez  has presented today for surgery, with the diagnosis of afib  The various methods of treatment have been discussed with the patient and family. After consideration of risks, benefits and other options for treatment, the patient has consented to  Procedure(s): Atrial Fibrillation Ablation (N/A) as a surgical intervention .  The patient's history has been reviewed, patient examined, no change in status, stable for surgery.  I have reviewed the patient's chart and labs.  Questions were answered to the patient's satisfaction.     Thompson Grayer

## 2016-07-18 NOTE — H&P (View-Only) (Signed)
Primary Care Physician: Pcp Not In System Primary Cardiologist: Dr Rockey Situ Primary Electrophysiologist: Dr Caryl Comes  Caroline Hernandez is a 70 y.o. female with a history of persistent atrial fibrillation who presents for follow up in the [facility].  She has refractory atrial fibrillation.  She has symptoms of fatigue and decreased exercise tolerance with her afib. + associated SOB.  She recently underwent cardioversion and felt "great" initially following the procedure.  Unfortunately she returned to afib despite medical therapy with amiodarone. Today, she denies symptoms of palpitations, chest pain,  orthopnea, PND, lower extremity edema, dizziness, presyncope, syncope, snoring, daytime somnolence, bleeding, or neurologic sequela. The patient is tolerating medications without difficulties and is otherwise without complaint today.    Atrial Fibrillation Risk Factors:  she does have symptoms or diagnosis of sleep apnea. (she snores but has not had sleep study)  she does not have a history of rheumatic fever.  she does not have a history of alcohol use.  she has a BMI of There is no height or weight on file to calculate BMI.. There were no vitals filed for this visit.  LA size: 76mm   Atrial Fibrillation Management history:  Previous antiarrhythmic drugs: amiodarone  Previous cardioversions: 1/17, 8/17  Previous ablations: none  CHADS2VASC score: 6  Anticoagulation history: coumadin   Past Medical History:  Diagnosis Date  . Amputation of left lower extremity below knee ([place])   . Amputation of right lower extremity below knee ([place])   . CAD (coronary artery disease)    a. 2004 Cardiac Arrest/CABG x 3 (LIMA->LAD, VG->OM, VG->RCA);  b. 06/2015 lexiscan MV: no significant ischemia, EF 48%, low risk->Med Rx.  . Chronic combined systolic and diastolic CHF (congestive heart failure) ([place])    a. 12/2014 Echo: EF 45-50%;  b. 08/2015 Echo: EF 45-50%, ant,  antsept HK, mildly dil LA, nl RV, mild-mod TR, sev PAH (70mmHg).  . CKD (chronic kidney disease), stage IV ([place])   . CLL (chronic lymphocytic leukemia) ([place])   . Diabetes mellitus without complication ([place])   . Essential hypertension   . GERD (gastroesophageal reflux disease)   . Hiatal hernia   . History of cardiac arrest    a. 2004.  Marland Kitchen Hyperlipidemia   . Ischemic cardiomyopathy    a. s/p MDT ICD (originally had 3710 lead-->gen change and lead revision ~ 2012 @ [facility]); b. 12/2014 Echo: EF 45-50%;  c.08/2015 Echo: EF 45-50%, ant, antsept HK. Nl RV.  Marland Kitchen PAD (peripheral artery disease) ([facility])    a. s/p BKA  . Persistent atrial fibrillation ([place])    a. Dx 12/2014.  CHA2DS2VASc = 6--> warfarin;  b. 09/2015 s/p DCCV-->on amio; c. s/p DCCV 04/25/16   Past Surgical History:  Procedure Laterality Date  . ABDOMINAL HYSTERECTOMY    . Amputation lower extremity bilaterally Bilateral   . CARDIAC CATHETERIZATION    . CORONARY ARTERY BYPASS GRAFT    . ELECTROPHYSIOLOGIC STUDY N/A 10/07/2015   Procedure: CARDIOVERSION;  Surgeon: Minna Merritts, MD;  Location: [facility] ORS;  Service: Cardiovascular;  Laterality: N/A;  . ELECTROPHYSIOLOGIC STUDY N/A 04/25/2016   Procedure: Cardioversion;  Surgeon: Minna Merritts, MD;  Location: [facility] ORS;  Service: Cardiovascular;  Laterality: N/A;  . INSERT / REPLACE / REMOVE PACEMAKER  2005   [employer]     Current Outpatient Prescriptions  Medication Sig Dispense Refill  . amitriptyline (ELAVIL) 25 MG tablet Take 25 mg by mouth every evening.  11  . amLODipine (NORVASC) 10  MG tablet Take 1 tablet (10 mg total) by mouth daily as needed. (Patient taking differently: Take 10 mg by mouth daily. ) 90 tablet 3  . atorvastatin (LIPITOR) 40 MG tablet Take 40 mg by mouth daily at 6 PM.   11  . carvedilol (COREG) 3.125 MG tablet Take 1 tablet (3.125 mg total) by mouth 2 (two) times daily. 60 tablet 6  . Cholecalciferol (HM VITAMIN D3) 4000 UNITS CAPS Take 4,000 Units by mouth  daily.    . furosemide (LASIX) 40 MG tablet Take 1 tablet (40 mg total) by mouth 2 (two) times daily. (Patient taking differently: Take 40 mg by mouth 2 (two) times daily. ) 60 tablet 11  . HUMALOG 100 UNIT/ML injection Inject 10-14 Units into the skin See admin instructions. Inject 12 units subcutaneous in the morning with breakfast, 10 units subcutaneous at lunch, and 14 units subcutaneous at suppertime.  6  . isosorbide mononitrate (IMDUR) 30 MG 24 hr tablet Take 1 tablet (30 mg total) by mouth 2 (two) times daily. 60 tablet 11  . LANTUS 100 UNIT/ML injection Inject 14-35 Units into the skin See admin instructions. Inject 35 units subcutaneous once a day in the morning. Inject 14 units subcutaneous once a day at bedtime.  0  . losartan (COZAAR) 100 MG tablet Take 100 mg by mouth daily.  11  . Multiple Vitamins-Minerals (OCUTABS-LUTEIN) TABS Take 1 tablet by mouth daily.    . nitroGLYCERIN (NITROSTAT) 0.4 MG SL tablet Place 1 tablet (0.4 mg total) under the tongue every 5 (five) minutes as needed for chest pain. 30 tablet 0  . pantoprazole (PROTONIX) 40 MG tablet Take 40 mg by mouth every evening.   0  . POLY-IRON 150 150 MG capsule Take 150 mg by mouth daily.  3  . potassium chloride SA (K-DUR,KLOR-CON) 20 MEQ tablet TAKE 1 TABLET BY MOUTH TWICE DAILY 60 tablet 3  . PREVIDENT 5000 DRY MOUTH 1.1 % GEL dental gel Take by mouth daily. Brush teeth with paste for at least 1 minute once a day.  5  . sertraline (ZOLOFT) 100 MG tablet Take 100 mg by mouth at bedtime.   11  . vitamin C (ASCORBIC ACID) 500 MG tablet Take 500 mg by mouth daily.    Marland Kitchen warfarin (COUMADIN) 4 MG tablet TAKE AS DIRECTED BY COUMADIN CLINIC 50 tablet 3   No current facility-administered medications for this encounter.     Allergies  Allergen Reactions  . Zosyn [Piperacillin Sod-Tazobactam So] Other (See Comments)    Renal failure    Social History   Social History  . Marital status: Divorced    Spouse name: N/A  .  Number of children: N/A  . Years of education: N/A   Occupational History  . Not on file.   Social History Main Topics  . Smoking status: Never Smoker  . Smokeless tobacco: Never Used  . Alcohol use No  . Drug use: No  . Sexual activity: Not on file   Other Topics Concern  . Not on file   Social History Narrative  . No narrative on file    Family History  Problem Relation Age of Onset  . CAD Mother   . Diabetes Mother   . Atrial fibrillation Mother   . Lung cancer Father   . Diabetes Father     ROS- All systems are reviewed and negative except as per the HPI above.  Physical Exam: Vitals:   06/21/16 1537  BP: Marland Kitchen)  166/72  Pulse: 62  Height: 5\' 8"  (1.727 m)    GEN- The patient is overweight, chronically ill appearing, alert and oriented x 3 today.   Head- normocephalic, atraumatic Eyes-  Sclera clear, conjunctiva pink Ears- hearing intact Oropharynx- clear Neck- supple  Lungs- Clear to ausculation bilaterally, normal work of breathing Heart- irregular rate and rhythm,   GI- soft, NT, ND, + BS Extremities- no clubbing, cyanosis, or edema MS- s/p bilateral BKA Skin- no rash or lesion Psych- euthymic mood, full affect Neuro- strength and sensation are intact  Wt Readings from Last 3 Encounters:  05/30/16 188 lb (85.3 kg)  04/25/16 187 lb (84.8 kg)  04/19/16 187 lb (84.8 kg)    EKG today demonstrates afib, V rate 62 bpm, incomplete LBBB (QRS 132 msec), QTc 499 msec Echo 12/16 demonstrated EF 45-50%, mildly dilated LA, mild to moderate TR, severe PA pressure elevation (60 mm Hg)  Epic records are reviewed at length today  Assessment and Plan:  1. Persistent atrial fibrillation The patient has very symptomatic persistent afib.  She has failed medical therapy with amiodarone. Unfortunately, our AAD options are limited by renal failure.  I really do not think that she has good options. Recent CASTLE AF trial would suggest possible improvement in mortality  and reduced hospitalization with ablation.  Therapeutic strategies for afib including medicine and ablation were discussed in detail with the patient today. Risk, benefits, and alternatives to EP study and radiofrequency ablation for afib were also discussed in detail today. These risks include but are not limited to stroke, bleeding, vascular damage, tamponade, perforation, damage to the esophagus, lungs, and other structures, pulmonary vein stenosis, worsening renal function, and death. The patient understands these risk and wishes to proceed.  We will therefore proceed with catheter ablation at the next available time.  She will need weekly INRs prior to ablation.  I will try to avoid contrast with ablation given renal failure.  Will obtain TEE prior to ablation to evaluate significantly elevated pulmonary pressures seen previously on echo and to exclude LAA thrombus. Continue coumadin for chads2vasc score of 6.  2. Snoring Given severely elevated PA pressures and symptoms of snoring, I have advised sleep study.  3. Chronic systolic dysfunction/ ischemic CM/ CD No ischemic symptoms euvolemic on exam Continue current medicines  4. CRI Avoid contrast as able with ablation   Thompson Grayer, MD 06/21/2016 11:16 PM

## 2016-07-18 NOTE — Interval H&P Note (Signed)
History and Physical Interval Note:  07/18/2016 8:36 AM  Caroline Hernandez  has presented today for surgery, with the diagnosis of AFIB  The various methods of treatment have been discussed with the patient and family. After consideration of risks, benefits and other options for treatment, the patient has consented to  Procedure(s): TRANSESOPHAGEAL ECHOCARDIOGRAM (TEE) (N/A) as a surgical intervention .  The patient's history has been reviewed, patient examined, no change in status, stable for surgery.  I have reviewed the patient's chart and labs.  Questions were answered to the patient's satisfaction.     Fransico Him

## 2016-07-18 NOTE — Anesthesia Postprocedure Evaluation (Signed)
Anesthesia Post Note  Patient: Caroline Hernandez  Procedure(s) Performed: Procedure(s) (LRB): Atrial Fibrillation Ablation (N/A)  Patient location during evaluation: PACU Anesthesia Type: General Level of consciousness: sedated Pain management: pain level controlled Vital Signs Assessment: post-procedure vital signs reviewed and stable Respiratory status: spontaneous breathing and respiratory function stable Cardiovascular status: stable Anesthetic complications: no    Last Vitals:  Vitals:   07/18/16 1510 07/18/16 1515  BP:  (!) 147/58  Pulse: (!) 47 60  Resp: 15 14  Temp:  36.4 C    Last Pain:  Vitals:   07/18/16 0945  TempSrc: Oral                 Allisa Einspahr DANIEL

## 2016-07-18 NOTE — Progress Notes (Addendum)
Site area: RFV x 3 Site Prior to Removal:  Level  Pressure Applied For:35 min Manual:   yes Patient Status During Pull:  stable Post Pull Site:  Level 0 Post Pull Instructions Given:  yes Post Pull Pulses Present: N/A Dressing Applied:  tegaderm Bedrest begins @ 1630 till 2230 Comments: ACT remains 197 over past hour. Dr Rayann Heman has approved removal.

## 2016-07-18 NOTE — Discharge Summary (Signed)
ELECTROPHYSIOLOGY PROCEDURE DISCHARGE SUMMARY    Patient ID: Caroline Hernandez,  MRN: [id number], DOB/AGE: 1946-01-08 70 y.o.  Admit date: 07/18/2016 Discharge date: 07/19/16  Primary Care Physician: Pcp Not In System Primary Cardiologist: Dr. Rockey Situ Electrophysiologist: Thompson Grayer, MD  Primary Discharge Diagnosis:  1. Persistent AFib     CHA2DS2Vasc is at least 6, on warfarin, monitored and managed with the coumadin clinic     Next INR scheduled 07/26/16  Secondary Discharge Diagnosis:  1. CAD 2. HTN 3. Chronic CHF (combined) 4. HLD 5. PVD hx b/l BKA 6. CKD stage IV 7. ICM w/ICD 8. DM  Procedures This Admission:  1.  Electrophysiology study and radiofrequency catheter ablation on 07/18/16 by Dr Thompson Grayer.  This study demonstrated CONCLUSIONS: 1. Atrial fibrillation upon presentation.   2. Intracardiac echo reveals a moderate sized left atrium with four separate pulmonary veins without evidence of pulmonary vein stenosis.  Cardiac CT nor rotational angiography were not performed today due to chronic renal failure 3. Successful electrical isolation and anatomical encircling of all four pulmonary veins with radiofrequency current. 3. Additional ablation along the posterior wall of the left atrium was performed in order to create a "standard box" lesion. 4. Atrial fibrillation successfully cardioverted to sinus rhythm.  Prolonged AV delay noted. 5. No early apparent complications.   Brief HPI: Caroline Hernandez is a 70 y.o. female with a history of persistent atrial fibrillation.  They have failed medical therapy with amiodarone. Risks, benefits, and alternatives to catheter ablation of atrial fibrillation were reviewed with the patient who wished to proceed.  The patient underwent TEE prior to the procedure which demonstrated LVEF 40-45% and no LAA thrombus.    Hospital Course:  The patient was admitted and underwent EPS/RFCA of atrial fibrillation with details as outlined  above.  They were monitored on telemetry overnight which demonstrated she had returned to AFib overnight with CVR.  Groin was without complication on the day of discharge.  The patient was examined and considered to be stable for discharge.  Wound care and restrictions were reviewed with the patient.  The patient will be seen back by Roderic Palau, NP in 4 weeks and Dr Rayann Heman in 12 weeks for post ablation follow up. We will stop her K+ secondary to hyperkalemia this morning and have BMET done at her INR check next week.    Physical Exam: Vitals:   07/19/16 0517 07/19/16 0600 07/19/16 0700 07/19/16 0809  BP: (!) 125/53 (!) 144/58 (!) 145/54 (!) 143/66  Pulse: 62 (!) 58 (!) 50   Resp: 14 15 13 16   Temp:    98.2 F (36.8 C)  TempSrc:    Oral  SpO2:  92% 95%   Weight:      Height:        GEN- The patient is well appearing, alert and oriented x 3 today.   HEENT: normocephalic, atraumatic; sclera clear, conjunctiva pink; hearing intact; oropharynx clear; neck supple  Lungs- Clear to ausculation bilaterally, normal work of breathing.  No wheezes, rales, rhonchi Heart- IRRR, no murmurs, rubs or gallops  GI- soft, non-tender, non-distended, bowel sounds present  Extremities- b.l BKA, groin without hematoma/bruit MS- no significant deformity or atrophy Skin- warm and dry, no rash or lesion Psych- euthymic mood, full affect Neuro- strength and sensation are intact   Labs:   Lab Results  Component Value Date   WBC 31.6 (H) 07/12/2016   HGB 12.9 07/12/2016   HCT 40.6 07/12/2016   MCV 86.8  07/12/2016   PLT 157 07/12/2016     Recent Labs Lab 07/19/16 0233  NA 135  K 5.5*  CL 104  CO2 21*  BUN 36*  CREATININE 2.22*  CALCIUM 8.3*  GLUCOSE 270*     Discharge Medications:    Medication List    STOP taking these medications   potassium chloride SA 20 MEQ tablet Commonly known as:  K-DUR,KLOR-CON     TAKE these medications   amitriptyline 25 MG tablet Commonly known as:   ELAVIL Take 25 mg by mouth every evening.   amLODipine 10 MG tablet Commonly known as:  NORVASC Take 1 tablet (10 mg total) by mouth daily as needed. What changed:  when to take this   atorvastatin 40 MG tablet Commonly known as:  LIPITOR Take 40 mg by mouth daily at 6 PM.   carvedilol 3.125 MG tablet Commonly known as:  COREG Take 1 tablet (3.125 mg total) by mouth 2 (two) times daily.   furosemide 40 MG tablet Commonly known as:  LASIX TAKE 1 TABLET(40 MG) BY MOUTH TWICE DAILY   HM VITAMIN D3 4000 units Caps Generic drug:  Cholecalciferol Take 4,000 Units by mouth daily.   insulin aspart 100 UNIT/ML injection Commonly known as:  novoLOG Inject 5-10 Units into the skin See admin instructions. 10 units into the skin before breakfast then 5 units before lunch then 10 units before supper (evening meal)   isosorbide mononitrate 30 MG 24 hr tablet Commonly known as:  IMDUR TAKE 1 TABLET(30 MG) BY MOUTH TWICE DAILY   LANTUS 100 UNIT/ML injection Generic drug:  insulin glargine Inject 14-35 Units into the skin See admin instructions. 35 units into the skin before breakfast and 14 units before supper (evening meal)   losartan 100 MG tablet Commonly known as:  COZAAR Take 100 mg by mouth every evening.   nitroGLYCERIN 0.4 MG SL tablet Commonly known as:  NITROSTAT Place 1 tablet (0.4 mg total) under the tongue every 5 (five) minutes as needed for chest pain.   pantoprazole 40 MG tablet Commonly known as:  PROTONIX Take 40 mg by mouth at bedtime.   POLY-IRON 150 150 MG capsule Generic drug:  iron polysaccharides Take 150 mg by mouth daily.   PREVIDENT 5000 DRY MOUTH 1.1 % Gel dental gel Generic drug:  sodium fluoride daily. Brush teeth with paste for at least 1 minute once a day   sertraline 100 MG tablet Commonly known as:  ZOLOFT Take 100 mg by mouth at bedtime.   vitamin C 500 MG tablet Commonly known as:  ASCORBIC ACID Take 500 mg by mouth daily.   VITEYES  AREDS FORMULA/LUTEIN Caps Take 1 capsule by mouth 2 (two) times daily.   warfarin 4 MG tablet Commonly known as:  COUMADIN Take 4 mg by mouth in the evening on Sun/Tues/Thurs and 6 mg on Mon/Wed/Fri/Sat What changed:  See the new instructions.       Disposition:  Home Discharge Instructions    Diet - low sodium heart healthy    Complete by:  As directed    Increase activity slowly    Complete by:  As directed      Follow-up Information    Ida Rogue, MD Follow up on 07/31/2016.   Specialty:  Cardiology Why:  11:40AM Contact information: [place] [address] [place] Follow up on 08/23/2016.   Specialty:  Cardiology  Why:  3:00PM  Contact information: [address] [id number] [place] [phone]       Thompson Grayer, MD Follow up on 10/18/2016.   Specialty:  Cardiology Why:  2:15PM Contact information: [address] [place] [address] [phone]        [facility] Follow up on 07/26/2016.   Specialty:  Cardiology Why:  11:50AM, coumadin clinic (lab draw) Contact information: [address], [place] [phone]          Duration of Discharge Encounter: Greater than 30 minutes including physician time.  Signed, Tommye Standard, PA-C 07/19/2016 8:32 AM  Pt with long first degree AV block (450 msec) and mobitz I second degree AV block at 50 bpm.  Now back in afib post ablation  Will discharge with standard follow-up.  If still in AF in 4 weeks, may require cardioversion.  May be helpful for me to look at EKG at that time given her prolonged AV block which makes it hard to differentiate sinus from [place] MD, [facility] 07/19/2016 5:03 PM

## 2016-07-18 NOTE — Transfer of Care (Signed)
Immediate Anesthesia Transfer of Care Note  Patient: Caroline Hernandez  Procedure(s) Performed: Procedure(s): Atrial Fibrillation Ablation (N/A)  Patient Location: Cath Lab  Anesthesia Type:General  Level of Consciousness: awake, sedated, patient cooperative and responds to stimulation  Airway & Oxygen Therapy: Patient Spontanous Breathing and Patient connected to face mask oxygen  Post-op Assessment: Report given to RN, Post -op Vital signs reviewed and stable, Patient moving all extremities and Patient moving all extremities X 4  Post vital signs: Reviewed and stable  Last Vitals:  Vitals:   07/18/16 1025 07/18/16 1030  BP: (!) 161/72 (!) 169/63  Pulse: (!) 56 64  Resp: 16 17  Temp:      Last Pain:  Vitals:   07/18/16 0945  TempSrc: Oral         Complications: No apparent anesthesia complications

## 2016-07-18 NOTE — Anesthesia Procedure Notes (Addendum)
Procedure Name: LMA Insertion Date/Time: 07/18/2016 11:40 AM Performed by: Jacquiline Doe A Pre-anesthesia Checklist: Patient identified, Emergency Drugs available, Suction available and Patient being monitored Patient Re-evaluated:Patient Re-evaluated prior to inductionOxygen Delivery Method: Circle System Utilized and Circle system utilized Preoxygenation: Pre-oxygenation with 100% oxygen Intubation Type: IV induction Ventilation: Mask ventilation without difficulty LMA: LMA inserted LMA Size: 4.0 Tube type: Oral Number of attempts: 1 Airway Equipment and Method: Bite block Placement Confirmation: positive ETCO2 Tube secured with: Tape Dental Injury: Teeth and Oropharynx as per pre-operative assessment

## 2016-07-19 ENCOUNTER — Encounter (HOSPITAL_COMMUNITY): Payer: Self-pay | Admitting: Internal Medicine

## 2016-07-19 ENCOUNTER — Other Ambulatory Visit: Payer: Self-pay

## 2016-07-19 ENCOUNTER — Other Ambulatory Visit: Payer: Self-pay | Admitting: Physician Assistant

## 2016-07-19 DIAGNOSIS — I2510 Atherosclerotic heart disease of native coronary artery without angina pectoris: Secondary | ICD-10-CM | POA: Diagnosis not present

## 2016-07-19 DIAGNOSIS — I481 Persistent atrial fibrillation: Secondary | ICD-10-CM | POA: Diagnosis not present

## 2016-07-19 DIAGNOSIS — E1122 Type 2 diabetes mellitus with diabetic chronic kidney disease: Secondary | ICD-10-CM | POA: Diagnosis not present

## 2016-07-19 DIAGNOSIS — Z79899 Other long term (current) drug therapy: Secondary | ICD-10-CM

## 2016-07-19 DIAGNOSIS — I255 Ischemic cardiomyopathy: Secondary | ICD-10-CM | POA: Diagnosis not present

## 2016-07-19 LAB — BASIC METABOLIC PANEL
ANION GAP: 10 (ref 5–15)
BUN: 36 mg/dL — AB (ref 6–20)
CALCIUM: 8.3 mg/dL — AB (ref 8.9–10.3)
CO2: 21 mmol/L — ABNORMAL LOW (ref 22–32)
Chloride: 104 mmol/L (ref 101–111)
Creatinine, Ser: 2.22 mg/dL — ABNORMAL HIGH (ref 0.44–1.00)
GFR calc Af Amer: 25 mL/min — ABNORMAL LOW (ref >60)
GFR, EST NON AFRICAN AMERICAN: 22 mL/min — AB (ref >60)
GLUCOSE: 270 mg/dL — AB (ref 65–99)
Potassium: 5.5 mmol/L — ABNORMAL HIGH (ref 3.5–5.1)
Sodium: 135 mmol/L (ref 135–145)

## 2016-07-19 LAB — PROTIME-INR
INR: 2.06
Prothrombin Time: 23.6 seconds — ABNORMAL HIGH (ref 11.4–15.2)

## 2016-07-19 LAB — GLUCOSE, CAPILLARY
GLUCOSE-CAPILLARY: 276 mg/dL — AB (ref 65–99)
GLUCOSE-CAPILLARY: 221 mg/dL — AB (ref 65–99)
Glucose-Capillary: 243 mg/dL — ABNORMAL HIGH (ref 65–99)

## 2016-07-19 MED ORDER — WARFARIN SODIUM 4 MG PO TABS: ORAL_TABLET | ORAL | 3 refills | Status: DC

## 2016-07-19 NOTE — Discharge Instructions (Signed)
No driving for 1 week. No lifting over 5 lbs for 1 week. No vigorous or sexual activity for 1 week. You may return to work on 07/25/16. Keep procedure site clean & dry. If you notice increased pain, swelling, bleeding or pus, call/return!  You may shower, but no soaking baths/hot tubs/pools for 1 week.   ----------   You have an appointment set up with the [facility].  Multiple studies have shown that being followed by a dedicated atrial fibrillation clinic in addition to the standard care you receive from your other physicians improves health. We believe that enrollment in the atrial fibrillation clinic will allow [place] to better care for you.   The phone number to the [facility] is [phone]. The clinic is staffed Monday through Friday from 8:30am to 5pm.  Parking Directions: The clinic is located in the Heart and Vascular Building connected to [facility]. 1)From [address] turn on to [address] and go to the 3rd entrance  (Heart and Vascular entrance) on the right. 2)Look to the right for Heart &Vascular Parking Garage. 3)A code for the entrance is required please call the clinic to receive this.   4)Take the elevators to the 1st floor. Registration is in the room with the glass walls at the end of the hallway.  If you have any trouble parking or locating the clinic, please dont hesitate to call [phone].

## 2016-07-19 NOTE — Progress Notes (Signed)
Patient discharge information gone over in detail with patient and daughter. All questions answered to patient's satisfaction. IV removed intact, telemetry discontinued. Patient discharged to home with daughter by way of wheelchair.

## 2016-07-20 ENCOUNTER — Encounter (HOSPITAL_COMMUNITY): Payer: Self-pay | Admitting: Cardiology

## 2016-07-26 ENCOUNTER — Ambulatory Visit (INDEPENDENT_AMBULATORY_CARE_PROVIDER_SITE_OTHER): Payer: Medicare HMO

## 2016-07-26 ENCOUNTER — Other Ambulatory Visit
Admission: RE | Admit: 2016-07-26 | Discharge: 2016-07-26 | Disposition: A | Discharge status: Home / Self Care | Payer: Medicare HMO | Source: Ambulatory Visit | Attending: Cardiovascular Disease | Admitting: Cardiovascular Disease

## 2016-07-26 ENCOUNTER — Other Ambulatory Visit: Payer: Medicare HMO

## 2016-07-26 DIAGNOSIS — Z7901 Long term (current) use of anticoagulants: Secondary | ICD-10-CM

## 2016-07-26 DIAGNOSIS — Z5181 Encounter for therapeutic drug level monitoring: Secondary | ICD-10-CM | POA: Insufficient documentation

## 2016-07-26 DIAGNOSIS — Z79899 Other long term (current) drug therapy: Secondary | ICD-10-CM | POA: Diagnosis not present

## 2016-07-26 DIAGNOSIS — I481 Persistent atrial fibrillation: Secondary | ICD-10-CM | POA: Diagnosis not present

## 2016-07-26 DIAGNOSIS — I4819 Other persistent atrial fibrillation: Secondary | ICD-10-CM

## 2016-07-26 LAB — POCT INR: INR: 1.7

## 2016-07-26 LAB — BASIC METABOLIC PANEL
Anion gap: 9 (ref 5–15)
BUN: 32 mg/dL — AB (ref 6–20)
CALCIUM: 9.7 mg/dL (ref 8.9–10.3)
CO2: 28 mmol/L (ref 22–32)
Chloride: 101 mmol/L (ref 101–111)
Creatinine, Ser: 2.02 mg/dL — ABNORMAL HIGH (ref 0.44–1.00)
GFR calc Af Amer: 28 mL/min — ABNORMAL LOW (ref >60)
GFR, EST NON AFRICAN AMERICAN: 24 mL/min — AB (ref >60)
GLUCOSE: 127 mg/dL — AB (ref 65–99)
POTASSIUM: 4 mmol/L (ref 3.5–5.1)
Sodium: 138 mmol/L (ref 135–145)

## 2016-07-31 ENCOUNTER — Ambulatory Visit (INDEPENDENT_AMBULATORY_CARE_PROVIDER_SITE_OTHER): Payer: Medicare HMO | Admitting: Cardiovascular Disease

## 2016-07-31 ENCOUNTER — Encounter: Payer: Self-pay | Admitting: Cardiovascular Disease

## 2016-07-31 VITALS — BP 156/74 | HR 51 | Ht 68.0 in | Wt 188.0 lb

## 2016-07-31 DIAGNOSIS — I481 Persistent atrial fibrillation: Secondary | ICD-10-CM

## 2016-07-31 DIAGNOSIS — I4819 Other persistent atrial fibrillation: Secondary | ICD-10-CM

## 2016-07-31 NOTE — Patient Instructions (Signed)

## 2016-07-31 NOTE — Progress Notes (Signed)
Patient ID: Caroline Hernandez, female   DOB: 01-Aug-1946, 70 y.o.   MRN: [id number] Cardiology Office Note  Date:  07/31/2016   ID:  Caroline Hernandez, DOB 11-Oct-1945, MRN [id number]  PCP:  PROVIDER NOT IN SYSTEM   Chief Complaint  Patient presents with  . OTHER    C/o chest discomfort and burping/acid reflux f/u cardiac ablation for Afib. Meds reviewed verbally with pt.    HPI:  70 y.o. female with coronary artery disease, h/o bypass surgery in 2004, CLL,  diabetes, below the knee amputation b/l for PAD, atrial fibrillation starting possibly in April 2016, noted again June 2016, acute on chronic diastolic CHF initially April 2016,  admitted to the hospital with diastolic CHF/leg swelling, abdominal bloating, angina, who presents For routine follow-up of her atrial fibrillation She has bilateral prosthesis She has ICD  In follow-up today she underwent recent ablation for atrial fibrillation 07/18/2016 Normal sinus rhythm restored after the procedure but developed atrial fibrillation the following day at the time of discharge In follow-up today she reports that she feels well with no complaints Takes Lasix 40 mg twice a day, denies excessive leg edema Recent INR 1.7 five days ago , dose changed  Previous testing reviewed with her TEE: 40% to 45% Mild to mod TR  07/18/2016: 1.  Electrophysiology study and radiofrequency catheter ablation on 07/18/16 by Dr Thompson Grayer.  This study demonstrated  Successful electrical isolation and anatomical encircling of all four pulmonary veins with radiofrequency current.  Additional ablation along the posterior wall of the left atrium   Atrial fibrillation successfully cardioverted to sinus rhythm. Prolonged AV delay noted.  INR 1.7 5 days ago (dose adjusted) INR 2.06 07/19/16  EKG on today's visit shows atrial fibrillation, ventricular rate 51 bpm intraventricular conduction delay  Other past medical history reviewed She underwent cardioversion at the  end of January 2017 for persistent atrial fibrillation Maintaining normal sinus rhythm early February (amiodarone decreased down to 100 mg at that time), and in March when seen by Dr. Caryl Comes Reports having some shortness of breath, worsening leg swelling over the past month or 2 Weight has increased from low 180 pounds up to 191 pounds at which time she increased Lasix up to 40 mg twice a day Mild improvement in her weight, down 4 pounds Still with significant fatigue, leg swelling, shortness of breath on exertion  Recent lab work showing hemoglobin A1c 6.7, creatinine 2.4  EKG on today's visit shows atrial fibrillation with ventricular rate 50 bpm, left anterior fascicular block  History of coronary disease with cardiac arrest in 2004, taken for three-vessel CABG, LIMA to the LAD, vein graft to the RCA, vein graft to an OM. No stenting or intervention since that time.  chronic infections of her legs leading to amputation. In hindsight family wonders if this could've been exacerbated by CLL.   She reports having problems in the past with anemia. Started on iron supplementation with improvement  PMH:   has a past medical history of Amputation of left lower extremity below knee ([place]); Amputation of right lower extremity below knee [facility]); CAD (coronary artery disease); Chronic combined systolic and diastolic CHF (congestive heart failure) ([place]); CKD (chronic kidney disease), stage IV (HCC); CLL (chronic lymphocytic leukemia) ([place]); Diabetes mellitus without complication ([place]); Essential hypertension; GERD (gastroesophageal reflux disease); Hiatal hernia; History of cardiac arrest; Hyperlipidemia; Ischemic cardiomyopathy; PAD (peripheral artery disease) ([place]); and Persistent atrial fibrillation ([place]).  PSH:    Past Surgical History:  Procedure Laterality Date  .  ABDOMINAL HYSTERECTOMY    . Amputation lower extremity bilaterally Bilateral   . CARDIAC CATHETERIZATION    . CORONARY ARTERY BYPASS  GRAFT    . ELECTROPHYSIOLOGIC STUDY N/A 10/07/2015   Procedure: CARDIOVERSION;  Surgeon: Minna Merritts, MD;  Location: [facility] ORS;  Service: Cardiovascular;  Laterality: N/A;  . ELECTROPHYSIOLOGIC STUDY N/A 04/25/2016   Procedure: Cardioversion;  Surgeon: Minna Merritts, MD;  Location: [facility] ORS;  Service: Cardiovascular;  Laterality: N/A;  . ELECTROPHYSIOLOGIC STUDY N/A 07/18/2016   Procedure: Atrial Fibrillation Ablation;  Surgeon: Thompson Grayer, MD;  Location: [place] CV LAB;  Service: Cardiovascular;  Laterality: N/A;  . IMPLANTABLE CARDIOVERTER DEFIBRILLATOR IMPLANT  2005   Medtronic   . TEE WITHOUT CARDIOVERSION N/A 07/18/2016   Procedure: TRANSESOPHAGEAL ECHOCARDIOGRAM (TEE);  Surgeon: Sueanne Margarita, MD;  Location: [facility] ENDOSCOPY;  Service: Cardiovascular;  Laterality: N/A;    Current Outpatient Prescriptions  Medication Sig Dispense Refill  . amitriptyline (ELAVIL) 25 MG tablet Take 25 mg by mouth every evening.  11  . amLODipine (NORVASC) 10 MG tablet Take 1 tablet (10 mg total) by mouth daily as needed. (Patient taking differently: Take 10 mg by mouth at bedtime. ) 90 tablet 3  . atorvastatin (LIPITOR) 40 MG tablet Take 40 mg by mouth daily at 6 PM.   11  . carvedilol (COREG) 3.125 MG tablet Take 1 tablet (3.125 mg total) by mouth 2 (two) times daily. 60 tablet 6  . Cholecalciferol (HM VITAMIN D3) 4000 UNITS CAPS Take 4,000 Units by mouth daily.    . furosemide (LASIX) 40 MG tablet TAKE 1 TABLET(40 MG) BY MOUTH TWICE DAILY 60 tablet 3  . insulin aspart (NOVOLOG) 100 UNIT/ML injection Inject 5-10 Units into the skin See admin instructions. 10 units into the skin before breakfast then 5 units before lunch then 10 units before supper (evening meal)    . insulin glargine (LANTUS) 100 UNIT/ML injection Inject 14-35 Units into the skin See admin instructions. 35 units into the skin before breakfast and 14 units before supper (evening meal)    . isosorbide mononitrate (IMDUR) 30 MG 24 hr  tablet TAKE 1 TABLET(30 MG) BY MOUTH TWICE DAILY 60 tablet 3  . losartan (COZAAR) 100 MG tablet Take 100 mg by mouth every evening.   11  . Multiple Vitamins-Minerals (VITEYES AREDS FORMULA/LUTEIN) CAPS Take 1 capsule by mouth 2 (two) times daily.    . nitroGLYCERIN (NITROSTAT) 0.4 MG SL tablet Place 1 tablet (0.4 mg total) under the tongue every 5 (five) minutes as needed for chest pain. 30 tablet 0  . pantoprazole (PROTONIX) 40 MG tablet Take 40 mg by mouth at bedtime.    Marland Kitchen POLY-IRON 150 150 MG capsule Take 150 mg by mouth daily.  3  . PREVIDENT 5000 DRY MOUTH 1.1 % GEL dental gel daily. Brush teeth with paste for at least 1 minute once a day  5  . sertraline (ZOLOFT) 100 MG tablet Take 100 mg by mouth at bedtime.   11  . vitamin C (ASCORBIC ACID) 500 MG tablet Take 500 mg by mouth daily.    Marland Kitchen warfarin (COUMADIN) 4 MG tablet Take 4 mg by mouth in the evening on Sun/Tues/Thurs and 6 mg on Mon/Wed/Fri/Sat 50 tablet 3   No current facility-administered medications for this visit.      Allergies:   Zosyn [piperacillin sod-tazobactam so]   Social History:  The patient  reports that she has never smoked. She has never used smokeless tobacco. She  reports that she does not drink alcohol or use drugs.   Family History:   family history includes Atrial fibrillation in her mother; CAD in her mother; Diabetes in her father and mother; Lung cancer in her father.    Review of Systems: Review of Systems  Constitutional: Negative.        Weight gain  Respiratory: Positive for shortness of breath.   Cardiovascular: Positive for leg swelling.  Gastrointestinal: Negative.   Musculoskeletal: Negative.   Neurological: Negative.   Psychiatric/Behavioral: Negative.   All other systems reviewed and are negative.    PHYSICAL EXAM: VS:  BP (!) 156/74 (BP Location: Left Arm, Patient Position: Sitting, Cuff Size: Large)   Pulse (!) 51   Ht 5\' 8"  (1.727 m)   Wt 188 lb (85.3 kg)   BMI 28.59 kg/m  , BMI  Body mass index is 28.59 kg/m. GEN: Well nourished, well developed, in no acute distress, obese  HEENT: normal  Neck: no JVD, carotid bruits, or masses Cardiac: IRRR; bradycardic, no murmurs, rubs, or gallops,no edema  Respiratory:  clear to auscultation bilaterally, normal work of breathing GI: soft, nontender, nondistended, + BS MS: no deformity or atrophy , Bilateral prosthesis Skin: warm and dry, no rash Neuro:  Strength and sensation are intact Psych: euthymic mood, full affect    Recent Labs: 11/18/2015: ALT 22; TSH 8.210 07/12/2016: Hemoglobin 12.9; Platelets 157 07/26/2016: BUN 32; Creatinine, Ser 2.02; Potassium 4.0; Sodium 138    Lipid Panel Lab Results  Component Value Date   CHOL 91 06/13/2015   HDL 25 (L) 06/13/2015   LDLCALC 40 06/13/2015   TRIG 128 06/13/2015      Wt Readings from Last 3 Encounters:  07/31/16 188 lb (85.3 kg)  07/18/16 188 lb (85.3 kg)  05/30/16 188 lb (85.3 kg)       ASSESSMENT AND PLAN:   SOB (shortness of breath) -  She reports breathing is stable on Lasix 40 twice a day Currently atrial fibrillation is not causing her considerable symptoms She has follow-up with EP in 2 weeks and again in 10/2016  Persistent atrial fibrillation (HCC) -  Recent ablation, converted to atrial fibrillation the next day Remains in atrial fibrillation today INR 1.7 five days ago Recommended one month of anticoagulation with INR 2.0 or greater At that time could consider pharmacologic or DCCV  Leg swelling Dramatic improvement in her leg swelling on Lasix 40 twice a day She will continue potassium 20 daily  Coronary artery disease involving native coronary artery of native heart with angina pectoris with documented spasm (HCC) Currently with no symptoms of angina. No further workup at this time. Continue current medication regimen.  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 Currently with no symptoms of angina  Systolic and  diastolic CHF, acute on chronic (HCC) Appears relatively euvolemic on today's visit Ejection fraction 40-45%  Essential hypertension Blood pressure mildly elevated. She reports blood pressure well controlled at home  No changes to her medications  Bradycardia She is asymptomatic, we'll continue low-dose carvedilol for now   Total encounter time more than 25 minutes  Greater than 50% was spent in counseling and coordination of care with the patient   Disposition:   F/U  6 months   Orders Placed This Encounter  Procedures  . EKG 12-Lead     Signed, Esmond Plants, M.D., Ph.D. 07/31/2016  [place], [place]

## 2016-08-09 ENCOUNTER — Ambulatory Visit (INDEPENDENT_AMBULATORY_CARE_PROVIDER_SITE_OTHER): Payer: Medicare HMO

## 2016-08-09 DIAGNOSIS — I481 Persistent atrial fibrillation: Secondary | ICD-10-CM | POA: Diagnosis not present

## 2016-08-09 DIAGNOSIS — I4819 Other persistent atrial fibrillation: Secondary | ICD-10-CM

## 2016-08-09 DIAGNOSIS — Z7901 Long term (current) use of anticoagulants: Secondary | ICD-10-CM | POA: Diagnosis not present

## 2016-08-09 LAB — POCT INR: INR: 2.1

## 2016-08-23 ENCOUNTER — Ambulatory Visit (HOSPITAL_COMMUNITY)
Admission: RE | Admit: 2016-08-23 | Discharge: 2016-08-23 | Disposition: A | Discharge status: Home / Self Care | Payer: Medicare HMO | Source: Ambulatory Visit | Attending: Nurse Practitioner | Admitting: Nurse Practitioner

## 2016-08-23 ENCOUNTER — Encounter (HOSPITAL_COMMUNITY): Payer: Self-pay | Admitting: Nurse Practitioner

## 2016-08-23 VITALS — BP 166/84 | HR 55 | Ht 68.0 in | Wt 188.0 lb

## 2016-08-23 DIAGNOSIS — Z8674 Personal history of sudden cardiac arrest: Secondary | ICD-10-CM | POA: Insufficient documentation

## 2016-08-23 DIAGNOSIS — N184 Chronic kidney disease, stage 4 (severe): Secondary | ICD-10-CM | POA: Insufficient documentation

## 2016-08-23 DIAGNOSIS — Z950 Presence of cardiac pacemaker: Secondary | ICD-10-CM | POA: Insufficient documentation

## 2016-08-23 DIAGNOSIS — K449 Diaphragmatic hernia without obstruction or gangrene: Secondary | ICD-10-CM | POA: Insufficient documentation

## 2016-08-23 DIAGNOSIS — E1122 Type 2 diabetes mellitus with diabetic chronic kidney disease: Secondary | ICD-10-CM | POA: Diagnosis not present

## 2016-08-23 DIAGNOSIS — Z880 Allergy status to penicillin: Secondary | ICD-10-CM | POA: Insufficient documentation

## 2016-08-23 DIAGNOSIS — Z7901 Long term (current) use of anticoagulants: Secondary | ICD-10-CM | POA: Diagnosis not present

## 2016-08-23 DIAGNOSIS — E785 Hyperlipidemia, unspecified: Secondary | ICD-10-CM | POA: Diagnosis not present

## 2016-08-23 DIAGNOSIS — K219 Gastro-esophageal reflux disease without esophagitis: Secondary | ICD-10-CM | POA: Insufficient documentation

## 2016-08-23 DIAGNOSIS — Z794 Long term (current) use of insulin: Secondary | ICD-10-CM | POA: Diagnosis not present

## 2016-08-23 DIAGNOSIS — I2510 Atherosclerotic heart disease of native coronary artery without angina pectoris: Secondary | ICD-10-CM | POA: Diagnosis not present

## 2016-08-23 DIAGNOSIS — Z89512 Acquired absence of left leg below knee: Secondary | ICD-10-CM | POA: Diagnosis not present

## 2016-08-23 DIAGNOSIS — I5042 Chronic combined systolic (congestive) and diastolic (congestive) heart failure: Secondary | ICD-10-CM | POA: Diagnosis not present

## 2016-08-23 DIAGNOSIS — I255 Ischemic cardiomyopathy: Secondary | ICD-10-CM | POA: Diagnosis not present

## 2016-08-23 DIAGNOSIS — Z9889 Other specified postprocedural states: Secondary | ICD-10-CM | POA: Diagnosis not present

## 2016-08-23 DIAGNOSIS — I481 Persistent atrial fibrillation: Secondary | ICD-10-CM | POA: Insufficient documentation

## 2016-08-23 DIAGNOSIS — Z89511 Acquired absence of right leg below knee: Secondary | ICD-10-CM | POA: Diagnosis not present

## 2016-08-23 DIAGNOSIS — Z8679 Personal history of other diseases of the circulatory system: Secondary | ICD-10-CM | POA: Diagnosis not present

## 2016-08-23 DIAGNOSIS — I130 Hypertensive heart and chronic kidney disease with heart failure and stage 1 through stage 4 chronic kidney disease, or unspecified chronic kidney disease: Secondary | ICD-10-CM | POA: Diagnosis not present

## 2016-08-23 NOTE — Progress Notes (Signed)
Primary Care Physician: PROVIDER NOT IN SYSTEM Referring Physician: Dr. Trude Mcburney Caroline Hernandez is a 70 y.o. female with a h/o CAD, HTN, chronic CHF, HLD, PVD hx b/l BKA, CKD stage IV, ICM with ICD, DM that is here for one month s/p ablation,performed 11/7 . She feels improved, does not feel the jittery feeling in her chest as she did before ablation. She feels that her energy is better than before. No swallowing or groin issues. Ekg today is either afib with slow v response or SR with first degree AV block, will review  with Dr. Rayann Heman. EKG on 11/20 with Dr. Rockey Situ appears similar.No swallowing or groin issues from procedure.  Today, she denies symptoms of palpitations, chest pain, shortness of breath, orthopnea, PND, lower extremity edema, dizziness, presyncope, syncope, or neurologic sequela. The patient is tolerating medications without difficulties and is otherwise without complaint today.   Past Medical History:  Diagnosis Date  . Amputation of left lower extremity below knee ([place])   . Amputation of right lower extremity below knee ([place])   . CAD (coronary artery disease)    a. 2004 Cardiac Arrest/CABG x 3 (LIMA->LAD, VG->OM, VG->RCA);  b. 06/2015 lexiscan MV: no significant ischemia, EF 48%, low risk->Med Rx.  . Chronic combined systolic and diastolic CHF (congestive heart failure) ([place])    a. 12/2014 Echo: EF 45-50%;  b. 08/2015 Echo: EF 45-50%, ant, antsept HK, mildly dil LA, nl RV, mild-mod TR, sev PAH (32mmHg).  . CKD (chronic kidney disease), stage IV ([place])   . CLL (chronic lymphocytic leukemia) ([place])   . Diabetes mellitus without complication ([place])   . Essential hypertension   . GERD (gastroesophageal reflux disease)   . Hiatal hernia   . History of cardiac arrest    a. 2004.  Marland Kitchen Hyperlipidemia   . Ischemic cardiomyopathy    a. s/p MDT ICD (originally had 3149 lead-->gen change and lead revision ~ 2012 @ [facility]); b. 12/2014 Echo: EF 45-50%;  c.08/2015 Echo: EF 45-50%, ant, antsept  HK. Nl RV.  Marland Kitchen PAD (peripheral artery disease) (HCC)    a. s/p BKA  . Persistent atrial fibrillation ([place])    a. Dx 12/2014.  CHA2DS2VASc = 6--> warfarin;  b. 09/2015 s/p DCCV-->on amio; c. s/p DCCV 04/25/16   Past Surgical History:  Procedure Laterality Date  . ABDOMINAL HYSTERECTOMY    . Amputation lower extremity bilaterally Bilateral   . CARDIAC CATHETERIZATION    . CORONARY ARTERY BYPASS GRAFT    . ELECTROPHYSIOLOGIC STUDY N/A 10/07/2015   Procedure: CARDIOVERSION;  Surgeon: Minna Merritts, MD;  Location: [facility] ORS;  Service: Cardiovascular;  Laterality: N/A;  . ELECTROPHYSIOLOGIC STUDY N/A 04/25/2016   Procedure: Cardioversion;  Surgeon: Minna Merritts, MD;  Location: [facility] ORS;  Service: Cardiovascular;  Laterality: N/A;  . ELECTROPHYSIOLOGIC STUDY N/A 07/18/2016   Procedure: Atrial Fibrillation Ablation;  Surgeon: Thompson Grayer, MD;  Location: [place] CV LAB;  Service: Cardiovascular;  Laterality: N/A;  . IMPLANTABLE CARDIOVERTER DEFIBRILLATOR IMPLANT  2005   [employer]tronic   . TEE WITHOUT CARDIOVERSION N/A 07/18/2016   Procedure: TRANSESOPHAGEAL ECHOCARDIOGRAM (TEE);  Surgeon: Sueanne Margarita, MD;  Location: [facility] ENDOSCOPY;  Service: Cardiovascular;  Laterality: N/A;    Current Outpatient Prescriptions  Medication Sig Dispense Refill  . amitriptyline (ELAVIL) 25 MG tablet Take 25 mg by mouth every evening.  11  . amLODipine (NORVASC) 10 MG tablet Take 1 tablet (10 mg total) by mouth daily as needed. (Patient taking differently: Take 10  mg by mouth at bedtime. ) 90 tablet 3  . atorvastatin (LIPITOR) 40 MG tablet Take 40 mg by mouth daily at 6 PM.   11  . carvedilol (COREG) 3.125 MG tablet Take 1 tablet (3.125 mg total) by mouth 2 (two) times daily. 60 tablet 6  . Cholecalciferol (HM VITAMIN D3) 4000 UNITS CAPS Take 4,000 Units by mouth daily.    . furosemide (LASIX) 40 MG tablet TAKE 1 TABLET(40 MG) BY MOUTH TWICE DAILY 60 tablet 3  . insulin aspart (NOVOLOG) 100 UNIT/ML injection Inject  5-10 Units into the skin See admin instructions. 10 units into the skin before breakfast then 5 units before lunch then 10 units before supper (evening meal)    . insulin glargine (LANTUS) 100 UNIT/ML injection Inject 14-35 Units into the skin See admin instructions. 35 units into the skin before breakfast and 14 units before supper (evening meal)    . isosorbide mononitrate (IMDUR) 30 MG 24 hr tablet TAKE 1 TABLET(30 MG) BY MOUTH TWICE DAILY 60 tablet 3  . losartan (COZAAR) 100 MG tablet Take 100 mg by mouth every evening.   11  . Multiple Vitamins-Minerals (VITEYES AREDS FORMULA/LUTEIN) CAPS Take 1 capsule by mouth 2 (two) times daily.    . nitroGLYCERIN (NITROSTAT) 0.4 MG SL tablet Place 1 tablet (0.4 mg total) under the tongue every 5 (five) minutes as needed for chest pain. 30 tablet 0  . pantoprazole (PROTONIX) 40 MG tablet Take 40 mg by mouth at bedtime.    Marland Kitchen POLY-IRON 150 150 MG capsule Take 150 mg by mouth daily.  3  . PREVIDENT 5000 DRY MOUTH 1.1 % GEL dental gel daily. Brush teeth with paste for at least 1 minute once a day  5  . sertraline (ZOLOFT) 100 MG tablet Take 100 mg by mouth at bedtime.   11  . vitamin C (ASCORBIC ACID) 500 MG tablet Take 500 mg by mouth daily.    Marland Kitchen warfarin (COUMADIN) 4 MG tablet Take 4 mg by mouth in the evening on Sun/Tues/Thurs and 6 mg on Mon/Wed/Fri/Sat 50 tablet 3   No current facility-administered medications for this encounter.     Allergies  Allergen Reactions  . Zosyn [Piperacillin Sod-Tazobactam So] Other (See Comments)    Renal failure    Social History   Social History  . Marital status: Divorced    Spouse name: N/A  . Number of children: N/A  . Years of education: N/A   Occupational History  . Not on file.   Social History Main Topics  . Smoking status: Never Smoker  . Smokeless tobacco: Never Used  . Alcohol use No  . Drug use: No  . Sexual activity: Not on file   Other Topics Concern  . Not on file   Social History  Narrative  . No narrative on file    Family History  Problem Relation Age of Onset  . CAD Mother   . Diabetes Mother   . Atrial fibrillation Mother   . Lung cancer Father   . Diabetes Father     ROS- All systems are reviewed and negative except as per the HPI above  Physical Exam: Vitals:   08/23/16 1458  BP: (!) 166/84  Pulse: (!) 55  Weight: 188 lb (85.3 kg)  Height: 5\' 8"  (1.727 m)   Wt Readings from Last 3 Encounters:  08/23/16 188 lb (85.3 kg)  07/31/16 188 lb (85.3 kg)  07/18/16 188 lb (85.3 kg)    Labs:  Lab Results  Component Value Date   NA 138 07/26/2016   K 4.0 07/26/2016   CL 101 07/26/2016   CO2 28 07/26/2016   GLUCOSE 127 (H) 07/26/2016   BUN 32 (H) 07/26/2016   CREATININE 2.02 (H) 07/26/2016   CALCIUM 9.7 07/26/2016   Lab Results  Component Value Date   INR 2.1 08/09/2016   Lab Results  Component Value Date   CHOL 91 06/13/2015   HDL 25 (L) 06/13/2015   LDLCALC 40 06/13/2015   TRIG 128 06/13/2015     GEN- The patient is well appearing, alert and oriented x 3 today.   Head- normocephalic, atraumatic Eyes-  Sclera clear, conjunctiva pink Ears- hearing intact Oropharynx- clear Neck- supple, no JVP Lymph- no cervical lymphadenopathy Lungs- Clear to ausculation bilaterally, normal work of breathing Heart- Regular rate and rhythm, no murmurs, rubs or gallops, PMI not laterally displaced GI- soft, NT, ND, + BS Extremities- no clubbing, cyanosis, or edema MS- no significant deformity or atrophy Skin- no rash or lesion Psych- euthymic mood, full affect Neuro- strength and sensation are intact  EKG-Sinus brady with prolonged first degree block/ Sinus arrhythmia or afib with slow v response at 55 bpm Epic records  reviewed    Assessment and Plan: 1. afib s/p ablation Pt feels improved  Heart rate sounds very regular but difficult to determine if she is in SR Will review  EKG with  Dr. Rayann Heman If in afib will require cardioversion with  4 consecutive INR's Will notify pt of plan once I get Dr. Jackalyn Lombard opinion Continue warfarin Continue low dose carvedilol  Butch Penny C. Carroll, [facility] [address] [place], [place] [address] [phone]

## 2016-08-25 ENCOUNTER — Other Ambulatory Visit: Payer: Self-pay | Admitting: Cardiovascular Disease

## 2016-08-27 ENCOUNTER — Encounter (HOSPITAL_BASED_OUTPATIENT_CLINIC_OR_DEPARTMENT_OTHER): Payer: Medicare HMO

## 2016-08-28 ENCOUNTER — Other Ambulatory Visit: Payer: Self-pay | Admitting: Physician Assistant

## 2016-08-30 ENCOUNTER — Ambulatory Visit (INDEPENDENT_AMBULATORY_CARE_PROVIDER_SITE_OTHER): Payer: Medicare HMO

## 2016-08-30 DIAGNOSIS — Z7901 Long term (current) use of anticoagulants: Secondary | ICD-10-CM

## 2016-08-30 DIAGNOSIS — I481 Persistent atrial fibrillation: Secondary | ICD-10-CM

## 2016-08-30 DIAGNOSIS — I4819 Other persistent atrial fibrillation: Secondary | ICD-10-CM

## 2016-08-30 LAB — POCT INR: INR: 3.1

## 2016-09-18 ENCOUNTER — Other Ambulatory Visit: Payer: Self-pay | Admitting: Cardiovascular Disease

## 2016-10-02 ENCOUNTER — Encounter: Payer: Self-pay | Admitting: Internal Medicine

## 2016-10-02 ENCOUNTER — Ambulatory Visit (INDEPENDENT_AMBULATORY_CARE_PROVIDER_SITE_OTHER): Payer: Medicare HMO | Admitting: Internal Medicine

## 2016-10-02 ENCOUNTER — Ambulatory Visit (HOSPITAL_COMMUNITY): Payer: Medicare HMO | Admitting: Nurse Practitioner

## 2016-10-02 VITALS — BP 130/60 | HR 64 | Ht 68.0 in | Wt 199.0 lb

## 2016-10-02 DIAGNOSIS — I255 Ischemic cardiomyopathy: Secondary | ICD-10-CM

## 2016-10-02 DIAGNOSIS — Z9889 Other specified postprocedural states: Secondary | ICD-10-CM | POA: Diagnosis not present

## 2016-10-02 DIAGNOSIS — I4819 Other persistent atrial fibrillation: Secondary | ICD-10-CM

## 2016-10-02 DIAGNOSIS — Z8679 Personal history of other diseases of the circulatory system: Secondary | ICD-10-CM

## 2016-10-02 DIAGNOSIS — I5022 Chronic systolic (congestive) heart failure: Secondary | ICD-10-CM

## 2016-10-02 DIAGNOSIS — Z7901 Long term (current) use of anticoagulants: Secondary | ICD-10-CM

## 2016-10-02 DIAGNOSIS — I481 Persistent atrial fibrillation: Secondary | ICD-10-CM | POA: Diagnosis not present

## 2016-10-02 NOTE — Progress Notes (Signed)
Primary Care Physician: PROVIDER NOT IN SYSTEM Primary Cardiologist: Dr Rockey Situ Primary Electrophysiologist: Dr Caryl Comes  Caroline Hernandez is a 71 y.o. female with a history of persistent atrial fibrillation who presents for follow up after her recent ablation.  She is doing well and denies procedure related complications.  She is back in afib today but mostly unaware.  Today, she denies symptoms of palpitations, chest pain,  orthopnea, PND, lower extremity edema, dizziness, presyncope, syncope, snoring, daytime somnolence, bleeding, or neurologic sequela. The patient is tolerating medications without difficulties and is otherwise without complaint today.    Atrial Fibrillation Risk Factors:  she does have symptoms or diagnosis of sleep apnea. (she snores but has not had sleep study)  she does not have a history of rheumatic fever.  she does not have a history of alcohol use.  she has a BMI of Body mass index is 30.26 kg/m.Marland Kitchen Filed Weights   10/02/16 1352  Weight: 199 lb (90.3 kg)    LA size: 52mm   Atrial Fibrillation Management history:  Previous antiarrhythmic drugs: amiodarone  Previous cardioversions: 1/17, 8/17  Previous ablations: 07/2016 PVI by Dr Ivonne Andrew score: 6  Anticoagulation history: coumadin   Past Medical History:  Diagnosis Date  . Amputation of left lower extremity below knee ([place])   . Amputation of right lower extremity below knee ([place])   . CAD (coronary artery disease)    a. 2004 Cardiac Arrest/CABG x 3 (LIMA->LAD, VG->OM, VG->RCA);  b. 06/2015 lexiscan MV: no significant ischemia, EF 48%, low risk->Med Rx.  . Chronic combined systolic and diastolic CHF (congestive heart failure) ([facility])    a. 12/2014 Echo: EF 45-50%;  b. 08/2015 Echo: EF 45-50%, ant, antsept HK, mildly dil LA, nl RV, mild-mod TR, sev PAH (65mmHg).  . CKD (chronic kidney disease), stage IV ([place])   . CLL (chronic lymphocytic leukemia) ([place])   . Diabetes mellitus without  complication ([place])   . Essential hypertension   . GERD (gastroesophageal reflux disease)   . Hiatal hernia   . History of cardiac arrest    a. 2004.  Marland Kitchen Hyperlipidemia   . Ischemic cardiomyopathy    a. s/p MDT ICD (originally had 9417 lead-->gen change and lead revision ~ 2012 @ [place]); b. 12/2014 Echo: EF 45-50%;  c.08/2015 Echo: EF 45-50%, ant, antsept HK. Nl RV.  Marland Kitchen PAD (peripheral artery disease) ([facility])    a. s/p BKA  . Persistent atrial fibrillation ([place])    a. Dx 12/2014.  CHA2DS2VASc = 6--> warfarin;  b. 09/2015 s/p DCCV-->on amio; c. s/p DCCV 04/25/16   Past Surgical History:  Procedure Laterality Date  . ABDOMINAL HYSTERECTOMY    . Amputation lower extremity bilaterally Bilateral   . CARDIAC CATHETERIZATION    . CORONARY ARTERY BYPASS GRAFT    . ELECTROPHYSIOLOGIC STUDY N/A 10/07/2015   Procedure: CARDIOVERSION;  Surgeon: Minna Merritts, MD;  Location: [facility] ORS;  Service: Cardiovascular;  Laterality: N/A;  . ELECTROPHYSIOLOGIC STUDY N/A 04/25/2016   Procedure: Cardioversion;  Surgeon: Minna Merritts, MD;  Location: [facility] ORS;  Service: Cardiovascular;  Laterality: N/A;  . ELECTROPHYSIOLOGIC STUDY N/A 07/18/2016   Procedure: Atrial Fibrillation Ablation;  Surgeon: Thompson Grayer, MD;  Location: [facility];  Service: Cardiovascular;  Laterality: N/A;  . IMPLANTABLE CARDIOVERTER DEFIBRILLATOR IMPLANT  2005   [employer]tronic   . TEE WITHOUT CARDIOVERSION N/A 07/18/2016   Procedure: TRANSESOPHAGEAL ECHOCARDIOGRAM (TEE);  Surgeon: Sueanne Margarita, MD;  Location: [facility];  Service: Cardiovascular;  Laterality: N/A;    Current Outpatient Prescriptions  Medication Sig Dispense Refill  . amitriptyline (ELAVIL) 25 MG tablet Take 25 mg by mouth every evening.  11  . amLODipine (NORVASC) 10 MG tablet TAKE 1 TABLET(10 MG) BY MOUTH DAILY AS NEEDED 90 tablet 3  . atorvastatin (LIPITOR) 40 MG tablet Take 40 mg by mouth daily at 6 PM.   11  . carvedilol (COREG) 3.125 MG tablet TAKE 1  TABLET(3.125 MG) BY MOUTH TWICE DAILY 60 tablet 3  . Cholecalciferol (HM VITAMIN D3) 4000 UNITS CAPS Take 4,000 Units by mouth daily.    . furosemide (LASIX) 40 MG tablet TAKE 1 TABLET(40 MG) BY MOUTH TWICE DAILY 60 tablet 3  . insulin aspart (NOVOLOG) 100 UNIT/ML injection Inject 5-10 Units into the skin See admin instructions. 10 units into the skin before breakfast then 5 units before lunch then 10 units before supper (evening meal)    . insulin glargine (LANTUS) 100 UNIT/ML injection Inject 14-35 Units into the skin See admin instructions. 35 units into the skin before breakfast and 14 units before supper (evening meal)    . isosorbide mononitrate (IMDUR) 30 MG 24 hr tablet TAKE 1 TABLET(30 MG) BY MOUTH TWICE DAILY 60 tablet 3  . isosorbide mononitrate (IMDUR) 30 MG 24 hr tablet TAKE 1 TABLET(30 MG) BY MOUTH TWICE DAILY 180 tablet 3  . losartan (COZAAR) 100 MG tablet Take 100 mg by mouth every evening.   11  . Multiple Vitamins-Minerals (VITEYES AREDS FORMULA/LUTEIN) CAPS Take 1 capsule by mouth 2 (two) times daily.    . nitroGLYCERIN (NITROSTAT) 0.4 MG SL tablet Place 1 tablet (0.4 mg total) under the tongue every 5 (five) minutes as needed for chest pain. 30 tablet 0  . pantoprazole (PROTONIX) 40 MG tablet Take 40 mg by mouth at bedtime.    Marland Kitchen POLY-IRON 150 150 MG capsule Take 150 mg by mouth daily.  3  . PREVIDENT 5000 DRY MOUTH 1.1 % GEL dental gel daily. Brush teeth with paste for at least 1 minute once a day  5  . sertraline (ZOLOFT) 100 MG tablet Take 100 mg by mouth at bedtime.   11  . vitamin C (ASCORBIC ACID) 500 MG tablet Take 500 mg by mouth daily.    Marland Kitchen warfarin (COUMADIN) 4 MG tablet Take 4 mg by mouth in the evening on Sun/Tues/Thurs and 6 mg on Mon/Wed/Fri/Sat 50 tablet 3   No current facility-administered medications for this visit.     Allergies  Allergen Reactions  . Zosyn [Piperacillin Sod-Tazobactam So] Other (See Comments)    Renal failure    Social History    Social History  . Marital status: Divorced    Spouse name: N/A  . Number of children: N/A  . Years of education: N/A   Occupational History  . Not on file.   Social History Main Topics  . Smoking status: Never Smoker  . Smokeless tobacco: Never Used  . Alcohol use No  . Drug use: No  . Sexual activity: Not on file   Other Topics Concern  . Not on file   Social History Narrative  . No narrative on file    Family History  Problem Relation Age of Onset  . CAD Mother   . Diabetes Mother   . Atrial fibrillation Mother   . Lung cancer Father   . Diabetes Father     ROS- All systems are reviewed and negative except as per the HPI above.  Physical  Exam: Vitals:   10/02/16 1352  BP: 130/60  Pulse: 64  Weight: 199 lb (90.3 kg)  Height: 5\' 8"  (1.727 m)    GEN- The patient is overweight, chronically ill appearing, alert and oriented x 3 today.   Head- normocephalic, atraumatic Eyes-  Sclera clear, conjunctiva pink Ears- hearing intact Oropharynx- clear Neck- supple  Lungs- Clear to ausculation bilaterally, normal work of breathing Heart- irregular rate and rhythm,   GI- soft, NT, ND, + BS Extremities- no clubbing, cyanosis, or edema MS- s/p bilateral BKA Skin- no rash or lesion Psych- euthymic mood, full affect Neuro- strength and sensation are intact  Wt Readings from Last 3 Encounters:  10/02/16 199 lb (90.3 kg)  08/23/16 188 lb (85.3 kg)  07/31/16 188 lb (85.3 kg)    EKG today demonstrates afib, V rate 57 bpm  Epic records are reviewed at length today  ICD interrogation today-- normal device function, V paced 3%  Assessment and Plan:  1. Persistent atrial fibrillation She has had ERAF post ablation but is otherwise doing well. I would advise cardioversion at this time. Risks of procedure discussed with the patient who wishes to proceed. Continue coumadin for chads2vasc score of 6.  2. Snoring Given severely elevated PA pressures and symptoms  of snoring, I have advised sleep study.  She has not yet had this.  3. Chronic systolic dysfunction/ ischemic CM/ CD No ischemic symptoms euvolemic on exam Continue current medicines   Return to see me 4 weeks post cardioversion.  Thompson Grayer, MD 10/02/2016 2:14 PM

## 2016-10-02 NOTE — Patient Instructions (Addendum)
Medication Instructions:  Your physician recommends that you continue on your current medications as directed. Please refer to the Current Medication list given to you today.   Labwork: Your physician recommends that you return for lab work in: CBC/BMP/INR   Testing/Procedures: Your physician has recommended that you have a Cardioversion (DCCV). Electrical Cardioversion uses a jolt of electricity to your heart either through paddles or wired patches attached to your chest. This is a controlled, usually prescheduled, procedure. Defibrillation is done under light anesthesia in the hospital, and you usually go home the day of the procedure. This is done to get your heart back into a normal rhythm. You are not awake for the procedure. Please see the instruction sheet given to you today.---10/09/16  Please arrive at [facility] at 9:30am Do not eat or drink after midnight the night prior to your procedure Okay to take medications the morning of the procedure with a small sip of water Will need someone to drive you home after the procedure    Follow-Up: Your physician recommends that you schedule a follow-up appointment in: 6 weeks with Dr Rayann Heman( move apt out that is already scheduled)   Any Other Special Instructions Will Be Listed Below (If Applicable).     If you need a refill on your cardiac medications before your next appointment, please call your pharmacy.

## 2016-10-03 ENCOUNTER — Telehealth: Payer: Self-pay | Admitting: Cardiovascular Disease

## 2016-10-03 LAB — CBC WITH DIFFERENTIAL/PLATELET
BASOS: 0 %
Basophils Absolute: 0.1 10*3/uL (ref 0.0–0.2)
EOS (ABSOLUTE): 0.3 10*3/uL (ref 0.0–0.4)
EOS: 1 %
HEMOGLOBIN: 12.7 g/dL (ref 11.1–15.9)
Hematocrit: 40.5 % (ref 34.0–46.6)
IMMATURE GRANS (ABS): 0 10*3/uL (ref 0.0–0.1)
IMMATURE GRANULOCYTES: 0 %
LYMPHS: 81 %
Lymphocytes Absolute: 24.6 10*3/uL — ABNORMAL HIGH (ref 0.7–3.1)
MCH: 27.3 pg (ref 26.6–33.0)
MCHC: 31.4 g/dL — ABNORMAL LOW (ref 31.5–35.7)
MCV: 87 fL (ref 79–97)
MONOCYTES: 2 %
Monocytes Absolute: 0.7 10*3/uL (ref 0.1–0.9)
NEUTROS ABS: 4.8 10*3/uL (ref 1.4–7.0)
NEUTROS PCT: 16 %
Platelets: 193 10*3/uL (ref 150–379)
RBC: 4.65 x10E6/uL (ref 3.77–5.28)
RDW: 16.1 % — ABNORMAL HIGH (ref 12.3–15.4)
WBC: 30.4 10*3/uL (ref 3.4–10.8)

## 2016-10-03 LAB — BASIC METABOLIC PANEL
BUN/Creatinine Ratio: 16 (ref 12–28)
BUN: 33 mg/dL — AB (ref 8–27)
CO2: 26 mmol/L (ref 18–29)
CREATININE: 2.1 mg/dL — AB (ref 0.57–1.00)
Calcium: 8.6 mg/dL — ABNORMAL LOW (ref 8.7–10.3)
Chloride: 99 mmol/L (ref 96–106)
GFR, EST AFRICAN AMERICAN: 27 mL/min/{1.73_m2} — AB (ref >59)
GFR, EST NON AFRICAN AMERICAN: 24 mL/min/{1.73_m2} — AB (ref >59)
Glucose: 320 mg/dL — ABNORMAL HIGH (ref 65–99)
POTASSIUM: 4.5 mmol/L (ref 3.5–5.2)
Sodium: 139 mmol/L (ref 134–144)

## 2016-10-03 LAB — PROTIME-INR
INR: 1.7 — ABNORMAL HIGH (ref 0.8–1.2)
PROTHROMBIN TIME: 17.8 s — AB (ref 9.1–12.0)

## 2016-10-03 NOTE — Telephone Encounter (Addendum)
Received cardiac clearance for pt to proceed w/ colonoscopy on 10/16/16 w/ Dr. Francesco Sor @ [facility].   They request clearance and instructions on holding coumadin prior to procedure.

## 2016-10-03 NOTE — Telephone Encounter (Signed)
I would reschedule her colonoscopy She has cardioversion scheduled very end of January 2018 She should not stop anticoagulation for one month following the procedure Would consider rescheduling GI procedure in March when it is safe to come off Coumadin

## 2016-10-03 NOTE — Telephone Encounter (Signed)
Pt is having colonoscopy on 2/5 and Dr. Francesco Sor (PCP ) needs to know if it is ok pt comes off her coumadin. Please call and advise.

## 2016-10-04 NOTE — Telephone Encounter (Signed)
Spoke w/ Cinda.  Advised her of Dr. Donivan Scull recommendation.  She transferred to procedure scheduling, pt's procedure has been rescheduled to 11/10/16. Asked her to call back if they need assistance w/ pt's coumadin.

## 2016-10-06 ENCOUNTER — Telehealth: Payer: Self-pay | Admitting: *Deleted

## 2016-10-06 NOTE — Telephone Encounter (Signed)
Patient is scheduled for DCCV on 10/09/16.  Her INR on 10/02/16 was 1.7.  We have asked her to take 8mg  tonight of her Warfarin and added a TEE for Mon prior to DCCV.  Patient and daughter aware.  Will have INR checked at the hospital and she has an outpatient INR scheduled for 10/18/16 at 11:50 in our [place] office

## 2016-10-09 ENCOUNTER — Ambulatory Visit (HOSPITAL_COMMUNITY)
Admission: RE | Admit: 2016-10-09 | Discharge: 2016-10-09 | Disposition: A | Discharge status: Home / Self Care | Payer: Medicare HMO | Source: Ambulatory Visit | Attending: Cardiology | Admitting: Cardiology

## 2016-10-09 ENCOUNTER — Encounter (HOSPITAL_COMMUNITY): Payer: Self-pay | Admitting: *Deleted

## 2016-10-09 ENCOUNTER — Ambulatory Visit (HOSPITAL_COMMUNITY): Payer: Medicare HMO | Admitting: Certified Registered"

## 2016-10-09 ENCOUNTER — Telehealth: Payer: Self-pay | Admitting: Cardiovascular Disease

## 2016-10-09 ENCOUNTER — Encounter (HOSPITAL_COMMUNITY)
Admission: RE | Disposition: A | Discharge status: Home / Self Care | Payer: Self-pay | Source: Ambulatory Visit | Attending: Cardiology

## 2016-10-09 ENCOUNTER — Ambulatory Visit (HOSPITAL_BASED_OUTPATIENT_CLINIC_OR_DEPARTMENT_OTHER): Payer: Medicare HMO

## 2016-10-09 DIAGNOSIS — C9110 Chronic lymphocytic leukemia of B-cell type not having achieved remission: Secondary | ICD-10-CM | POA: Insufficient documentation

## 2016-10-09 DIAGNOSIS — I481 Persistent atrial fibrillation: Secondary | ICD-10-CM | POA: Diagnosis present

## 2016-10-09 DIAGNOSIS — I454 Nonspecific intraventricular block: Secondary | ICD-10-CM | POA: Insufficient documentation

## 2016-10-09 DIAGNOSIS — Z7901 Long term (current) use of anticoagulants: Secondary | ICD-10-CM | POA: Insufficient documentation

## 2016-10-09 DIAGNOSIS — Z8674 Personal history of sudden cardiac arrest: Secondary | ICD-10-CM | POA: Diagnosis not present

## 2016-10-09 DIAGNOSIS — Z794 Long term (current) use of insulin: Secondary | ICD-10-CM | POA: Diagnosis not present

## 2016-10-09 DIAGNOSIS — E1122 Type 2 diabetes mellitus with diabetic chronic kidney disease: Secondary | ICD-10-CM | POA: Diagnosis not present

## 2016-10-09 DIAGNOSIS — I340 Nonrheumatic mitral (valve) insufficiency: Secondary | ICD-10-CM

## 2016-10-09 DIAGNOSIS — Z801 Family history of malignant neoplasm of trachea, bronchus and lung: Secondary | ICD-10-CM | POA: Insufficient documentation

## 2016-10-09 DIAGNOSIS — Z79899 Other long term (current) drug therapy: Secondary | ICD-10-CM | POA: Insufficient documentation

## 2016-10-09 DIAGNOSIS — N184 Chronic kidney disease, stage 4 (severe): Secondary | ICD-10-CM | POA: Insufficient documentation

## 2016-10-09 DIAGNOSIS — Z9889 Other specified postprocedural states: Secondary | ICD-10-CM | POA: Insufficient documentation

## 2016-10-09 DIAGNOSIS — E785 Hyperlipidemia, unspecified: Secondary | ICD-10-CM | POA: Diagnosis not present

## 2016-10-09 DIAGNOSIS — Z880 Allergy status to penicillin: Secondary | ICD-10-CM | POA: Diagnosis not present

## 2016-10-09 DIAGNOSIS — I255 Ischemic cardiomyopathy: Secondary | ICD-10-CM | POA: Insufficient documentation

## 2016-10-09 DIAGNOSIS — K219 Gastro-esophageal reflux disease without esophagitis: Secondary | ICD-10-CM | POA: Diagnosis not present

## 2016-10-09 DIAGNOSIS — E1151 Type 2 diabetes mellitus with diabetic peripheral angiopathy without gangrene: Secondary | ICD-10-CM | POA: Diagnosis not present

## 2016-10-09 DIAGNOSIS — Q211 Atrial septal defect: Secondary | ICD-10-CM | POA: Insufficient documentation

## 2016-10-09 DIAGNOSIS — Z8249 Family history of ischemic heart disease and other diseases of the circulatory system: Secondary | ICD-10-CM | POA: Diagnosis not present

## 2016-10-09 DIAGNOSIS — K449 Diaphragmatic hernia without obstruction or gangrene: Secondary | ICD-10-CM | POA: Insufficient documentation

## 2016-10-09 DIAGNOSIS — I4891 Unspecified atrial fibrillation: Secondary | ICD-10-CM | POA: Diagnosis not present

## 2016-10-09 DIAGNOSIS — I130 Hypertensive heart and chronic kidney disease with heart failure and stage 1 through stage 4 chronic kidney disease, or unspecified chronic kidney disease: Secondary | ICD-10-CM | POA: Insufficient documentation

## 2016-10-09 DIAGNOSIS — Z90710 Acquired absence of both cervix and uterus: Secondary | ICD-10-CM | POA: Insufficient documentation

## 2016-10-09 DIAGNOSIS — Z833 Family history of diabetes mellitus: Secondary | ICD-10-CM | POA: Insufficient documentation

## 2016-10-09 DIAGNOSIS — Z951 Presence of aortocoronary bypass graft: Secondary | ICD-10-CM | POA: Diagnosis not present

## 2016-10-09 DIAGNOSIS — I2510 Atherosclerotic heart disease of native coronary artery without angina pectoris: Secondary | ICD-10-CM | POA: Diagnosis not present

## 2016-10-09 DIAGNOSIS — I5042 Chronic combined systolic (congestive) and diastolic (congestive) heart failure: Secondary | ICD-10-CM | POA: Diagnosis not present

## 2016-10-09 HISTORY — PX: TEE WITHOUT CARDIOVERSION: SHX5443

## 2016-10-09 HISTORY — PX: CARDIOVERSION: SHX1299

## 2016-10-09 LAB — PROTIME-INR
INR: 2.29
Prothrombin Time: 25.6 seconds — ABNORMAL HIGH (ref 11.4–15.2)

## 2016-10-09 SURGERY — CARDIOVERSION
Anesthesia: General

## 2016-10-09 MED ORDER — LACTATED RINGERS IV SOLN: INTRAVENOUS | Status: DC | PRN

## 2016-10-09 MED ORDER — SODIUM CHLORIDE 0.9 % IV SOLN
INTRAVENOUS | Status: DC | PRN
  Administered 2016-10-09: 12:00:00 via INTRAVENOUS

## 2016-10-09 MED ORDER — SODIUM CHLORIDE 0.9 % IV SOLN: INTRAVENOUS | Status: DC

## 2016-10-09 MED ORDER — PROPOFOL 500 MG/50ML IV EMUL
INTRAVENOUS | Status: DC | PRN
  Administered 2016-10-09: 125 ug/kg/min via INTRAVENOUS

## 2016-10-09 NOTE — Telephone Encounter (Addendum)
Pt takes Coumadin for afib with CHADS2 score of 3 (HF, DM, and HTN). Will be ok to hold Coumadin for 5 days, however pt is having a DCCV today, 10/09/16. She will require uninterrupted anticoagulation for 1 month after (through Feb 26). Also had an ablation on 07/18/16 but will have had 3 months of anticoagulation by the beginning of February. The earliest pt will be able to have her colonoscopy is the week after 3/1. Would schedule it on 3/8 or 3/9 at the earliest to be safe and ensure 1 month of anticoagulation. Pt is ok to hold Coumadin up to 5 days prior as needed if procedure is scheduled 3/8 at the earliest.

## 2016-10-09 NOTE — Anesthesia Preprocedure Evaluation (Signed)
Anesthesia Evaluation  Patient identified by MRN, date of birth, ID band Patient awake    Reviewed: Allergy & Precautions, NPO status , Patient's Chart, lab work & pertinent test results  History of Anesthesia Complications Negative for: history of anesthetic complications  Airway Mallampati: III  TM Distance: >3 FB Neck ROM: Full    Dental no notable dental hx. (+) Dental Advisory Given, Poor Dentition, Missing   Pulmonary neg pulmonary ROS,    Pulmonary exam normal        Cardiovascular hypertension, + angina + CAD, + CABG, + Peripheral Vascular Disease and +CHF  + pacemaker + Cardiac Defibrillator  Rhythm:Irregular Rate:Normal     Neuro/Psych negative psych ROS   GI/Hepatic hiatal hernia, GERD  ,  Endo/Other  diabetes  Renal/GU Renal InsufficiencyRenal disease     Musculoskeletal negative musculoskeletal ROS (+)   Abdominal Normal abdominal exam  (+)   Peds  Hematology negative hematology ROS (+)   Anesthesia Other Findings   Reproductive/Obstetrics negative OB ROS                             Anesthesia Physical  Anesthesia Plan  ASA: III  Anesthesia Plan: General   Post-op Pain Management:    Induction: Intravenous  Airway Management Planned: LMA  Additional Equipment:   Intra-op Plan:   Post-operative Plan: Extubation in OR  Informed Consent: I have reviewed the patients History and Physical, chart, labs and discussed the procedure including the risks, benefits and alternatives for the proposed anesthesia with the patient or authorized representative who has indicated his/her understanding and acceptance.   Dental advisory given  Plan Discussed with: CRNA and Surgeon  Anesthesia Plan Comments:         Anesthesia Quick Evaluation

## 2016-10-09 NOTE — H&P (Signed)
Caroline Hernandez  10/02/2016 1:45 PM  Office Visit  MRN:  [id number]  Description: Female DOB: 06-08-1946 Provider: Thompson Grayer, MD Department: [facility] [address] [facility]  Vitals   BP  130/60 (BP Location: Left Arm, Patient Position: Sitting, Cuff Size: Large)   Pulse  64   Ht  5\' 8"  (1.727 m)   Wt  199 lb (90.3 kg)   BMI  30.26 kg/m      Vitals History  Progress Notes   Thompson Grayer, MD at 10/02/2016 1:45 PM   Status: Signed       Primary Care Physician: PROVIDER NOT [place] Primary Cardiologist: Dr Rockey Situ Primary Electrophysiologist: Dr Caryl Comes  David Towson is a 71 y.o. female with a history of persistent atrial fibrillation who presents for follow up after her recent ablation.  She is doing well and denies procedure related complications.  She is back in afib today but mostly unaware.  Today, she denies symptoms of palpitations, chest pain,  orthopnea, PND, lower extremity edema, dizziness, presyncope, syncope, snoring, daytime somnolence, bleeding, or neurologic sequela. The patient is tolerating medications without difficulties and is otherwise without complaint today.    Atrial Fibrillation Risk Factors:  she does have symptoms or diagnosis of sleep apnea. (she snores but has not had sleep study)  she does not have a history of rheumatic fever.  she does not have a history of alcohol use.  she has a BMI of Body mass index is 30.26 kg/m.Marland Kitchen    Filed Weights   10/02/16 1352  Weight: 199 lb (90.3 kg)    LA size: 9mm   Atrial Fibrillation Management history:  Previous antiarrhythmic drugs: amiodarone  Previous cardioversions: 1/17, 8/17  Previous ablations: 07/2016 PVI by Dr Ivonne Andrew score: 6  Anticoagulation history: coumadin       Past Medical History:  Diagnosis Date  . Amputation of left lower extremity below knee ([place])   . Amputation of right lower extremity below knee ([place])   . CAD (coronary artery disease)      a. 2004 Cardiac Arrest/CABG x 3 (LIMA->LAD, VG->OM, VG->RCA);  b. 06/2015 lexiscan MV: no significant ischemia, EF 48%, low risk->Med Rx.  . Chronic combined systolic and diastolic CHF (congestive heart failure) ([place])    a. 12/2014 Echo: EF 45-50%;  b. 08/2015 Echo: EF 45-50%, ant, antsept HK, mildly dil LA, nl RV, mild-mod TR, sev PAH (1mmHg).  . CKD (chronic kidney disease), stage IV ([place])   . CLL (chronic lymphocytic leukemia) ([place])   . Diabetes mellitus without complication ([place])   . Essential hypertension   . GERD (gastroesophageal reflux disease)   . Hiatal hernia   . History of cardiac arrest    a. 2004.  Marland Kitchen Hyperlipidemia   . Ischemic cardiomyopathy    a. s/p MDT ICD (originally had 3662 lead-->gen change and lead revision ~ 2012 @ [place]); b. 12/2014 Echo: EF 45-50%;  c.08/2015 Echo: EF 45-50%, ant, antsept HK. Nl RV.  Marland Kitchen PAD (peripheral artery disease) ([facility])    a. s/p BKA  . Persistent atrial fibrillation ([place])    a. Dx 12/2014.  CHA2DS2VASc = 6--> warfarin;  b. 09/2015 s/p DCCV-->on amio; c. s/p DCCV 04/25/16        Past Surgical History:  Procedure Laterality Date  . ABDOMINAL HYSTERECTOMY    . Amputation lower extremity bilaterally Bilateral   . CARDIAC CATHETERIZATION    . CORONARY ARTERY BYPASS GRAFT    . ELECTROPHYSIOLOGIC STUDY N/A 10/07/2015   Procedure: CARDIOVERSION;  Surgeon: Minna Merritts, MD;  Location: [facility] ORS;  Service: Cardiovascular;  Laterality: N/A;  . ELECTROPHYSIOLOGIC STUDY N/A 04/25/2016   Procedure: Cardioversion;  Surgeon: Minna Merritts, MD;  Location: [facility] ORS;  Service: Cardiovascular;  Laterality: N/A;  . ELECTROPHYSIOLOGIC STUDY N/A 07/18/2016   Procedure: Atrial Fibrillation Ablation;  Surgeon: Thompson Grayer, MD;  Location: [place] [facility];  Service: Cardiovascular;  Laterality: N/A;  . IMPLANTABLE CARDIOVERTER DEFIBRILLATOR IMPLANT  2005   [employer]   . TEE WITHOUT CARDIOVERSION N/A 07/18/2016   Procedure:  TRANSESOPHAGEAL ECHOCARDIOGRAM (TEE);  Surgeon: Sueanne Margarita, MD;  Location: [facility] ENDOSCOPY;  Service: Cardiovascular;  Laterality: N/A;          Current Outpatient Prescriptions  Medication Sig Dispense Refill  . amitriptyline (ELAVIL) 25 MG tablet Take 25 mg by mouth every evening.  11  . amLODipine (NORVASC) 10 MG tablet TAKE 1 TABLET(10 MG) BY MOUTH DAILY AS NEEDED 90 tablet 3  . atorvastatin (LIPITOR) 40 MG tablet Take 40 mg by mouth daily at 6 PM.   11  . carvedilol (COREG) 3.125 MG tablet TAKE 1 TABLET(3.125 MG) BY MOUTH TWICE DAILY 60 tablet 3  . Cholecalciferol (HM VITAMIN D3) 4000 UNITS CAPS Take 4,000 Units by mouth daily.    . furosemide (LASIX) 40 MG tablet TAKE 1 TABLET(40 MG) BY MOUTH TWICE DAILY 60 tablet 3  . insulin aspart (NOVOLOG) 100 UNIT/ML injection Inject 5-10 Units into the skin See admin instructions. 10 units into the skin before breakfast then 5 units before lunch then 10 units before supper (evening meal)    . insulin glargine (LANTUS) 100 UNIT/ML injection Inject 14-35 Units into the skin See admin instructions. 35 units into the skin before breakfast and 14 units before supper (evening meal)    . isosorbide mononitrate (IMDUR) 30 MG 24 hr tablet TAKE 1 TABLET(30 MG) BY MOUTH TWICE DAILY 60 tablet 3  . isosorbide mononitrate (IMDUR) 30 MG 24 hr tablet TAKE 1 TABLET(30 MG) BY MOUTH TWICE DAILY 180 tablet 3  . losartan (COZAAR) 100 MG tablet Take 100 mg by mouth every evening.   11  . Multiple Vitamins-Minerals (VITEYES AREDS FORMULA/LUTEIN) CAPS Take 1 capsule by mouth 2 (two) times daily.    . nitroGLYCERIN (NITROSTAT) 0.4 MG SL tablet Place 1 tablet (0.4 mg total) under the tongue every 5 (five) minutes as needed for chest pain. 30 tablet 0  . pantoprazole (PROTONIX) 40 MG tablet Take 40 mg by mouth at bedtime.    Marland Kitchen POLY-IRON 150 150 MG capsule Take 150 mg by mouth daily.  3  . PREVIDENT 5000 DRY MOUTH 1.1 % GEL dental gel daily. Brush teeth with  paste for at least 1 minute once a day  5  . sertraline (ZOLOFT) 100 MG tablet Take 100 mg by mouth at bedtime.   11  . vitamin C (ASCORBIC ACID) 500 MG tablet Take 500 mg by mouth daily.    Marland Kitchen warfarin (COUMADIN) 4 MG tablet Take 4 mg by mouth in the evening on Sun/Tues/Thurs and 6 mg on Mon/Wed/Fri/Sat 50 tablet 3   No current facility-administered medications for this visit.          Allergies  Allergen Reactions  . Zosyn [Piperacillin Sod-Tazobactam So] Other (See Comments)    Renal failure    Social History        Social History  . Marital status: Divorced    Spouse name: N/A  . Number of children: N/A  .  Years of education: N/A      Occupational History  . Not on file.       Social History Main Topics  . Smoking status: Never Smoker  . Smokeless tobacco: Never Used  . Alcohol use No  . Drug use: No  . Sexual activity: Not on file       Other Topics Concern  . Not on file      Social History Narrative  . No narrative on file         Family History  Problem Relation Age of Onset  . CAD Mother   . Diabetes Mother   . Atrial fibrillation Mother   . Lung cancer Father   . Diabetes Father     ROS- All systems are reviewed and negative except as per the HPI above.  Physical Exam:    Vitals:   10/02/16 1352  BP: 130/60  Pulse: 64  Weight: 199 lb (90.3 kg)  Height: 5\' 8"  (1.727 m)    GEN- The patient is overweight, chronically ill appearing, alert and oriented x 3 today.   Head- normocephalic, atraumatic Eyes-  Sclera clear, conjunctiva pink Ears- hearing intact Oropharynx- clear Neck- supple  Lungs- Clear to ausculation bilaterally, normal work of breathing Heart- irregular rate and rhythm,   GI- soft, NT, ND, + BS Extremities- no clubbing, cyanosis, or edema MS- s/p bilateral BKA Skin- no rash or lesion Psych- euthymic mood, full affect Neuro- strength and sensation are intact     Wt Readings from Last 3  Encounters:  10/02/16 199 lb (90.3 kg)  08/23/16 188 lb (85.3 kg)  07/31/16 188 lb (85.3 kg)    EKG today demonstrates afib, V rate 57 bpm  Epic records are reviewed at length today  ICD interrogation today-- normal device function, V paced 3%  Assessment and Plan:  1. Persistent atrial fibrillation She has had ERAF post ablation but is otherwise doing well. I would advise cardioversion at this time. Risks of procedure discussed with the patient who wishes to proceed. Continue coumadin for chads2vasc score of 6.  2. Snoring Given severely elevated PA pressures and symptoms of snoring, I have advised sleep study.  She has not yet had this.  3. Chronic systolic dysfunction/ ischemic CM/ CD No ischemic symptoms euvolemic on exam Continue current medicines   Return to see me 4 weeks post cardioversion.  Thompson Grayer, MD 10/02/2016 2:14 PM      INR recently 1.7 For TEE [place]

## 2016-10-09 NOTE — Anesthesia Postprocedure Evaluation (Addendum)
Anesthesia Post Note  Patient: Kely Dohn  Procedure(s) Performed: Procedure(s) (LRB): CARDIOVERSION (N/A) TRANSESOPHAGEAL ECHOCARDIOGRAM (TEE) (N/A)  Patient location during evaluation: Endoscopy Anesthesia Type: General Level of consciousness: awake Pain management: pain level controlled Vital Signs Assessment: post-procedure vital signs reviewed and stable Respiratory status: spontaneous breathing Cardiovascular status: stable Postop Assessment: no signs of nausea or vomiting Anesthetic complications: no        Last Vitals:  Vitals:   10/09/16 0902  BP: (!) 181/64  Pulse: (!) 59  Resp: 17  Temp: 36.8 C    Last Pain:  Vitals:   10/09/16 0902  TempSrc: Oral   Pain Goal:                 Zaydyn Havey JR,JOHN Marlon Suleiman

## 2016-10-09 NOTE — Transfer of Care (Signed)
Immediate Anesthesia Transfer of Care Note  Patient: Caroline Hernandez  Procedure(s) Performed: Procedure(s): CARDIOVERSION (N/A) TRANSESOPHAGEAL ECHOCARDIOGRAM (TEE) (N/A)  Patient Location: Endoscopy Unit  Anesthesia Type:MAC  Level of Consciousness: awake, alert  and oriented  Airway & Oxygen Therapy: Patient Spontanous Breathing and Patient connected to nasal cannula oxygen  Post-op Assessment: Report given to RN, Post -op Vital signs reviewed and stable and Patient moving all extremities X 4  Post vital signs: Reviewed and stable  Last Vitals:  Vitals:   10/09/16 0902  BP: (!) 181/64  Pulse: (!) 59  Resp: 17  Temp: 36.8 C    Last Pain:  Vitals:   10/09/16 0902  TempSrc: Oral         Complications: No apparent anesthesia complications

## 2016-10-09 NOTE — Progress Notes (Signed)
  Echocardiogram Echocardiogram Transesophageal has been performed.  Caroline Hernandez 10/09/2016, 12:12 PM

## 2016-10-09 NOTE — Telephone Encounter (Signed)
Reviewed recommendations with Caroline Hernandez and will route these over to their office via fax at [phone] with instructions to call back if any further questions.

## 2016-10-09 NOTE — Discharge Instructions (Signed)
Electrical Cardioversion, Care After °This sheet gives you information about how to care for yourself after your procedure. Your health care provider may also give you more specific instructions. If you have problems or questions, contact your health care provider. °What can I expect after the procedure? °After the procedure, it is common to have: °· Some redness on the skin where the shocks were given. °Follow these instructions at home: °· Do not drive for 24 hours if you were given a medicine to help you relax (sedative). °· Take over-the-counter and prescription medicines only as told by your health care provider. °· Ask your health care provider how to check your pulse. Check it often. °· Rest for 48 hours after the procedure or as told by your health care provider. °· Avoid or limit your caffeine use as told by your health care provider. °Contact a health care provider if: °· You feel like your heart is beating too quickly or your pulse is not regular. °· You have a serious muscle cramp that does not go away. °Get help right away if: °· You have discomfort in your chest. °· You are dizzy or you feel faint. °· You have trouble breathing or you are short of breath. °· Your speech is slurred. °· You have trouble moving an arm or leg on one side of your body. °· Your fingers or toes turn cold or blue. °This information is not intended to replace advice given to you by your health care provider. Make sure you discuss any questions you have with your health care provider. °Document Released: 06/18/2013 Document Revised: 03/31/2016 Document Reviewed: 03/03/2016 °Elsevier Interactive Patient Education © 2017 Elsevier Inc. ° °

## 2016-10-09 NOTE — CV Procedure (Signed)
TEE/DCCV procedure note Pt sedated by anesthesia with diprovan 190 mg IV total. Spontaneous contrast noted but no LAA thrombus Pt subsequently had synchronized DCCV with 120 J out success; second attempt with 150 J resulted in sinus with pat and frequent PACs No immediate complications Continue coumadin uninterrupted for at least 4 weeks Kirk Ruths

## 2016-10-09 NOTE — Telephone Encounter (Signed)
Routing to pharmacist for review.

## 2016-10-09 NOTE — Telephone Encounter (Signed)
[facility] is calling states pt is scheduled on 3/1 for colonoscopy, and ask if it is ok for pt to hold coumadin. Please call and advise.

## 2016-10-10 ENCOUNTER — Encounter (HOSPITAL_COMMUNITY): Payer: Self-pay | Admitting: Cardiology

## 2016-10-18 ENCOUNTER — Ambulatory Visit: Payer: Medicare HMO | Admitting: Internal Medicine

## 2016-10-18 ENCOUNTER — Ambulatory Visit (INDEPENDENT_AMBULATORY_CARE_PROVIDER_SITE_OTHER): Payer: Medicare HMO

## 2016-10-18 DIAGNOSIS — I4819 Other persistent atrial fibrillation: Secondary | ICD-10-CM

## 2016-10-18 DIAGNOSIS — Z7901 Long term (current) use of anticoagulants: Secondary | ICD-10-CM

## 2016-10-18 DIAGNOSIS — I481 Persistent atrial fibrillation: Secondary | ICD-10-CM

## 2016-10-18 LAB — POCT INR: INR: 2.2

## 2016-10-26 ENCOUNTER — Other Ambulatory Visit: Payer: Self-pay | Admitting: Cardiovascular Disease

## 2016-10-27 NOTE — Telephone Encounter (Signed)
Refill request

## 2016-11-08 ENCOUNTER — Ambulatory Visit (INDEPENDENT_AMBULATORY_CARE_PROVIDER_SITE_OTHER): Payer: Medicare HMO

## 2016-11-08 DIAGNOSIS — I4819 Other persistent atrial fibrillation: Secondary | ICD-10-CM

## 2016-11-08 DIAGNOSIS — Z7901 Long term (current) use of anticoagulants: Secondary | ICD-10-CM

## 2016-11-08 DIAGNOSIS — I481 Persistent atrial fibrillation: Secondary | ICD-10-CM | POA: Diagnosis not present

## 2016-11-08 LAB — POCT INR: INR: 2.1

## 2016-11-14 ENCOUNTER — Other Ambulatory Visit: Payer: Self-pay

## 2016-11-15 ENCOUNTER — Ambulatory Visit (INDEPENDENT_AMBULATORY_CARE_PROVIDER_SITE_OTHER): Payer: Medicare HMO | Admitting: Internal Medicine

## 2016-11-15 ENCOUNTER — Other Ambulatory Visit: Payer: Self-pay

## 2016-11-15 ENCOUNTER — Encounter: Payer: Self-pay | Admitting: Internal Medicine

## 2016-11-15 VITALS — BP 140/72 | HR 76 | Ht 68.0 in

## 2016-11-15 DIAGNOSIS — I4819 Other persistent atrial fibrillation: Secondary | ICD-10-CM

## 2016-11-15 DIAGNOSIS — I255 Ischemic cardiomyopathy: Secondary | ICD-10-CM

## 2016-11-15 DIAGNOSIS — Z8679 Personal history of other diseases of the circulatory system: Secondary | ICD-10-CM | POA: Diagnosis not present

## 2016-11-15 DIAGNOSIS — I481 Persistent atrial fibrillation: Secondary | ICD-10-CM

## 2016-11-15 DIAGNOSIS — Z9889 Other specified postprocedural states: Secondary | ICD-10-CM | POA: Diagnosis not present

## 2016-11-15 NOTE — Patient Instructions (Signed)
Medication Instructions:  Your physician recommends that you continue on your current medications as directed. Please refer to the Current Medication list given to you today.  Labwork: None ordered.  Testing/Procedures: None ordered.  Follow-Up: Your physician wants you to follow-up in: 3 months with Dr. Allred.      Any Other Special Instructions Will Be Listed Below (If Applicable).  If you need a refill on your cardiac medications before your next appointment, please call your pharmacy.   

## 2016-11-15 NOTE — Progress Notes (Signed)
Primary Care Physician: PROVIDER NOT IN SYSTEM Primary Cardiologist: Dr Rockey Situ Primary Electrophysiologist: Dr Caryl Comes  Caroline Hernandez is a 71 y.o. female with a history of persistent atrial fibrillation who presents for follow up after her recent ablation.  She is doing well and denies procedure related complications.  She feels that she is maintaining sinus rhythm.  Her energy is much improved.  She is pleased with results.  Today, she denies symptoms of palpitations, chest pain,  orthopnea, PND, lower extremity edema, dizziness, presyncope, syncope, snoring, daytime somnolence, bleeding, or neurologic sequela. The patient is tolerating medications without difficulties and is otherwise without complaint today.    Atrial Fibrillation Risk Factors:  she does have symptoms or diagnosis of sleep apnea. (she snores but has not had sleep study)  she does not have a history of rheumatic fever.  she does not have a history of alcohol use.  she has a BMI of There is no height or weight on file to calculate BMI.. There were no vitals filed for this visit.  LA size: 27mm   Atrial Fibrillation Management history:  Previous antiarrhythmic drugs: amiodarone  Previous cardioversions: 1/17, 8/17  Previous ablations: 07/2016 PVI by Dr Ivonne Andrew score: 6  Anticoagulation history: coumadin   Past Medical History:  Diagnosis Date  . Amputation of left lower extremity below knee ([place])   . Amputation of right lower extremity below knee ([place])   . CAD (coronary artery disease)    a. 2004 Cardiac Arrest/CABG x 3 (LIMA->LAD, VG->OM, VG->RCA);  b. 06/2015 lexiscan MV: no significant ischemia, EF 48%, low risk->Med Rx.  . Chronic combined systolic and diastolic CHF (congestive heart failure) ([place])    a. 12/2014 Echo: EF 45-50%;  b. 08/2015 Echo: EF 45-50%, ant, antsept HK, mildly dil LA, nl RV, mild-mod TR, sev PAH (58mmHg).  . CKD (chronic kidney disease), stage IV ([place])   . CLL  (chronic lymphocytic leukemia) ([place])   . Diabetes mellitus without complication ([place])   . Essential hypertension   . GERD (gastroesophageal reflux disease)   . Hiatal hernia   . History of cardiac arrest    a. 2004.  Marland Kitchen Hyperlipidemia   . Ischemic cardiomyopathy    a. s/p MDT ICD (originally had 9147 lead-->gen change and lead revision ~ 2012 @ [place]); b. 12/2014 Echo: EF 45-50%;  c.08/2015 Echo: EF 45-50%, ant, antsept HK. Nl RV.  Marland Kitchen PAD (peripheral artery disease) (H[facility])    a. s/p BKA  . Persistent atrial fibrillation ([place])    a. Dx 12/2014.  CHA2DS2VASc = 6--> warfarin;  b. 09/2015 s/p DCCV-->on amio; c. s/p DCCV 04/25/16   Past Surgical History:  Procedure Laterality Date  . ABDOMINAL HYSTERECTOMY    . Amputation lower extremity bilaterally Bilateral   . CARDIAC CATHETERIZATION    . CARDIOVERSION N/A 10/09/2016   Procedure: CARDIOVERSION;  Surgeon: Lelon Perla, MD;  Location: [facility] ENDOSCOPY;  Service: Cardiovascular;  Laterality: N/A;  . CORONARY ARTERY BYPASS GRAFT    . ELECTROPHYSIOLOGIC STUDY N/A 10/07/2015   Procedure: CARDIOVERSION;  Surgeon: Minna Merritts, MD;  Location: [facility] ORS;  Service: Cardiovascular;  Laterality: N/A;  . ELECTROPHYSIOLOGIC STUDY N/A 04/25/2016   Procedure: Cardioversion;  Surgeon: Minna Merritts, MD;  Location: [facility] ORS;  Service: Cardiovascular;  Laterality: N/A;  . ELECTROPHYSIOLOGIC STUDY N/A 07/18/2016   Procedure: Atrial Fibrillation Ablation;  Surgeon: Thompson Grayer, MD;  Location: [place] CV LAB;  Service: Cardiovascular;  Laterality: N/A;  . IMPLANTABLE CARDIOVERTER  DEFIBRILLATOR IMPLANT  2005   Medtronic   . TEE WITHOUT CARDIOVERSION N/A 07/18/2016   Procedure: TRANSESOPHAGEAL ECHOCARDIOGRAM (TEE);  Surgeon: Sueanne Margarita, MD;  Location: [facility] ENDOSCOPY;  Service: Cardiovascular;  Laterality: N/A;  . TEE WITHOUT CARDIOVERSION N/A 10/09/2016   Procedure: TRANSESOPHAGEAL ECHOCARDIOGRAM (TEE);  Surgeon: Lelon Perla, MD;  Location: [facility] ENDOSCOPY;   Service: Cardiovascular;  Laterality: N/A;    Current Outpatient Prescriptions  Medication Sig Dispense Refill  . amitriptyline (ELAVIL) 25 MG tablet Take 25 mg by mouth every evening.  11  . amLODipine (NORVASC) 10 MG tablet Take 10 mg by mouth daily.    Marland Kitchen atorvastatin (LIPITOR) 40 MG tablet Take 40 mg by mouth daily at 6 PM.   11  . carvedilol (COREG) 3.125 MG tablet TAKE 1 TABLET(3.125 MG) BY MOUTH TWICE DAILY 60 tablet 3  . Cholecalciferol (HM VITAMIN D3) 4000 UNITS CAPS Take 4,000 Units by mouth daily.    . furosemide (LASIX) 40 MG tablet TAKE 1 TABLET(40 MG) BY MOUTH TWICE DAILY 60 tablet 3  . insulin aspart (NOVOLOG) 100 UNIT/ML injection Inject 5-10 Units into the skin See admin instructions. 10 units into the skin before breakfast then 5 units before lunch then 10 units before supper (evening meal)    . insulin glargine (LANTUS) 100 UNIT/ML injection Inject 14-35 Units into the skin See admin instructions. 35 units into the skin before breakfast and 14 units before supper (evening meal)    . isosorbide mononitrate (IMDUR) 30 MG 24 hr tablet TAKE 1 TABLET(30 MG) BY MOUTH TWICE DAILY 60 tablet 3  . losartan (COZAAR) 100 MG tablet Take 100 mg by mouth every evening.   11  . Multiple Vitamins-Minerals (VITEYES AREDS FORMULA/LUTEIN) CAPS Take 1 capsule by mouth 2 (two) times daily.    . nitroGLYCERIN (NITROSTAT) 0.4 MG SL tablet Place 1 tablet (0.4 mg total) under the tongue every 5 (five) minutes as needed for chest pain. 30 tablet 0  . pantoprazole (PROTONIX) 40 MG tablet Take 40 mg by mouth at bedtime.    Marland Kitchen POLY-IRON 150 150 MG capsule Take 150 mg by mouth daily.  3  . potassium chloride SA (K-DUR,KLOR-CON) 20 MEQ tablet Take 1 tablet by mouth daily.    Marland Kitchen PREVIDENT 5000 DRY MOUTH 1.1 % GEL dental gel daily. Brush teeth with paste for at least 1 minute once a day  5  . sertraline (ZOLOFT) 100 MG tablet Take 100 mg by mouth at bedtime.   11  . vitamin C (ASCORBIC ACID) 500 MG tablet Take  500 mg by mouth daily.    Marland Kitchen warfarin (COUMADIN) 4 MG tablet Take 4 mg by mouth in the evening on Sun/Tues/Thurs and 6 mg on Mon/Wed/Fri/Sat 50 tablet 3   No current facility-administered medications for this visit.     Allergies  Allergen Reactions  . Zosyn [Piperacillin Sod-Tazobactam So] Other (See Comments)    Renal failure    Social History   Social History  . Marital status: Divorced    Spouse name: N/A  . Number of children: N/A  . Years of education: N/A   Occupational History  . Not on file.   Social History Main Topics  . Smoking status: Never Smoker  . Smokeless tobacco: Never Used  . Alcohol use No  . Drug use: No  . Sexual activity: Not on file   Other Topics Concern  . Not on file   Social History Narrative  . No narrative on file  Family History  Problem Relation Age of Onset  . CAD Mother   . Diabetes Mother   . Atrial fibrillation Mother   . Lung cancer Father   . Diabetes Father     ROS- All systems are reviewed and negative except as per the HPI above.  Physical Exam: Vitals:   11/15/16 1353  BP: 140/72  Pulse: 76  SpO2: 95%  Height: 5\' 8"  (1.727 m)    GEN- The patient is overweight, chronically ill appearing, alert and oriented x 3 today.   Head- normocephalic, atraumatic Eyes-  Sclera clear, conjunctiva pink Ears- hearing intact Oropharynx- clear Neck- supple  Lungs- Clear to ausculation bilaterally, normal work of breathing Heart- regular rate and rhythm,   GI- soft, NT, ND, + BS Extremities- no clubbing, cyanosis, or edema MS- s/p bilateral BKA Skin- no rash or lesion Psych- euthymic mood, full affect Neuro- strength and sensation are intact  Wt Readings from Last 3 Encounters:  10/02/16 199 lb (90.3 kg)  08/23/16 188 lb (85.3 kg)  07/31/16 188 lb (85.3 kg)    EKG today demonstrates sinus rhythm with marked first degree AV block  Epic records are reviewed at length today   ICD not interrogated  today  Assessment and Plan:  1. Persistent atrial fibrillation Maintaining sinus rhythm off AAD therapy Continue coumadin for chads2vasc score of 6.  2. Snoring Given severely elevated PA pressures and symptoms of snoring, I have advised sleep study.  She has not yet had this.  3. Chronic systolic dysfunction/ ischemic CM/ CD No ischemic symptoms euvolemic on exam Continue current medicines   Return to see me in 3 months  Thompson Grayer, MD 11/15/2016 2:17 PM

## 2016-12-06 ENCOUNTER — Ambulatory Visit (INDEPENDENT_AMBULATORY_CARE_PROVIDER_SITE_OTHER): Payer: Medicare HMO

## 2016-12-06 DIAGNOSIS — Z7901 Long term (current) use of anticoagulants: Secondary | ICD-10-CM

## 2016-12-06 DIAGNOSIS — I481 Persistent atrial fibrillation: Secondary | ICD-10-CM

## 2016-12-06 DIAGNOSIS — I4819 Other persistent atrial fibrillation: Secondary | ICD-10-CM

## 2016-12-06 LAB — POCT INR: INR: 1.7

## 2016-12-18 ENCOUNTER — Ambulatory Visit (INDEPENDENT_AMBULATORY_CARE_PROVIDER_SITE_OTHER): Payer: Medicare HMO

## 2016-12-18 ENCOUNTER — Telehealth: Payer: Self-pay | Admitting: Cardiovascular Disease

## 2016-12-18 DIAGNOSIS — Z7901 Long term (current) use of anticoagulants: Secondary | ICD-10-CM

## 2016-12-18 DIAGNOSIS — I4819 Other persistent atrial fibrillation: Secondary | ICD-10-CM

## 2016-12-18 DIAGNOSIS — I481 Persistent atrial fibrillation: Secondary | ICD-10-CM | POA: Diagnosis not present

## 2016-12-18 LAB — POCT INR: INR: 2.1

## 2016-12-18 NOTE — Telephone Encounter (Signed)
Pt in office for INR check prior to tomorrows dental extractions.  Per pt request, I have routed today's INR results to [place], [phone] Notified Opal Sidles at the surgery center.

## 2017-01-10 ENCOUNTER — Ambulatory Visit (INDEPENDENT_AMBULATORY_CARE_PROVIDER_SITE_OTHER): Payer: Medicare HMO | Admitting: *Deleted

## 2017-01-10 DIAGNOSIS — Z7901 Long term (current) use of anticoagulants: Secondary | ICD-10-CM | POA: Diagnosis not present

## 2017-01-10 DIAGNOSIS — I481 Persistent atrial fibrillation: Secondary | ICD-10-CM

## 2017-01-10 DIAGNOSIS — I4819 Other persistent atrial fibrillation: Secondary | ICD-10-CM

## 2017-01-10 LAB — POCT INR: INR: 2.1

## 2017-01-16 ENCOUNTER — Other Ambulatory Visit: Payer: Self-pay | Admitting: Cardiovascular Disease

## 2017-01-16 ENCOUNTER — Other Ambulatory Visit: Payer: Self-pay | Admitting: Physician Assistant

## 2017-01-17 NOTE — Telephone Encounter (Signed)
carvedilol (COREG) 3.125 MG tablet  Medication  Date: 09/19/2016 Department: [place] Ordering/Authorizing: Minna Merritts, MD  Order Providers   Prescribing Provider Encounter Provider  Gollan, Kathlene November, MD Minna Merritts, MD  Medication Detail    Disp Refills Start End   carvedilol (COREG) 3.125 MG tablet 60 tablet 3 09/19/2016    Sig: TAKE 1 TABLET(3.125 MG) BY MOUTH TWICE DAILY   E-Prescribing Status: Receipt confirmed by pharmacy (09/19/2016 12:52 PM EST)   Pharmacy   [id number] - [place], [place]

## 2017-01-17 NOTE — Telephone Encounter (Signed)
Refill Request.  

## 2017-01-24 ENCOUNTER — Ambulatory Visit (INDEPENDENT_AMBULATORY_CARE_PROVIDER_SITE_OTHER): Payer: Medicare HMO | Admitting: *Deleted

## 2017-01-24 DIAGNOSIS — I4819 Other persistent atrial fibrillation: Secondary | ICD-10-CM

## 2017-01-24 DIAGNOSIS — I481 Persistent atrial fibrillation: Secondary | ICD-10-CM | POA: Diagnosis not present

## 2017-01-24 DIAGNOSIS — Z7901 Long term (current) use of anticoagulants: Secondary | ICD-10-CM | POA: Diagnosis not present

## 2017-01-24 LAB — POCT INR: INR: 2.3

## 2017-02-16 ENCOUNTER — Telehealth: Payer: Self-pay | Admitting: Internal Medicine

## 2017-02-16 ENCOUNTER — Ambulatory Visit (INDEPENDENT_AMBULATORY_CARE_PROVIDER_SITE_OTHER): Payer: Medicare HMO | Admitting: *Deleted

## 2017-02-16 ENCOUNTER — Ambulatory Visit (INDEPENDENT_AMBULATORY_CARE_PROVIDER_SITE_OTHER): Payer: Medicare HMO | Admitting: Internal Medicine

## 2017-02-16 VITALS — BP 136/68 | HR 75 | Ht 68.0 in | Wt 210.0 lb

## 2017-02-16 DIAGNOSIS — I4819 Other persistent atrial fibrillation: Secondary | ICD-10-CM

## 2017-02-16 DIAGNOSIS — I259 Chronic ischemic heart disease, unspecified: Secondary | ICD-10-CM

## 2017-02-16 DIAGNOSIS — I481 Persistent atrial fibrillation: Secondary | ICD-10-CM

## 2017-02-16 DIAGNOSIS — Z7901 Long term (current) use of anticoagulants: Secondary | ICD-10-CM | POA: Diagnosis not present

## 2017-02-16 DIAGNOSIS — I469 Cardiac arrest, cause unspecified: Secondary | ICD-10-CM | POA: Diagnosis not present

## 2017-02-16 DIAGNOSIS — Z95810 Presence of automatic (implantable) cardiac defibrillator: Secondary | ICD-10-CM

## 2017-02-16 LAB — POCT INR: INR: 1.9

## 2017-02-16 NOTE — Addendum Note (Signed)
Addendum  created 02/16/17 7544 by Lyn Hollingshead, MD   Sign clinical note

## 2017-02-16 NOTE — Progress Notes (Signed)
Patient Care Team: System, Provider Not In as PCP - General Gollan, Kathlene November, MD as Consulting Physician (Cardiology)      HPI Caroline Hernandez is a 71 y.o. female seen in follow-up for ICD implanted for primary prevention in the setting of ischemic heart disease with prior bypass surgery In 2004.    She has a history of a prior ICD for cardiac arrest;  this occurred prior to her bypass. She then had a 6949-lead implanted in the end up with lead failure treated with insertion of a new lead and the abandoning of the old lead. This was done 2012. She has had no interval ICD discharges.  She has a history of atrial fibrillation initially diagnosed 2016. She underwent cardioversion 1/17. 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  She underwent catheter ablation by Dr. Greggory Brandy.  She remains on Coumadin  She has chronic kidney disease with a creatinine was 2.10  1/18    Echocardiogram 12/15 with an EF of 45-50% with severe pulmonary hypertension.   she is feeling much better following ablation without palpitations chest pain or sob  Thromboembolic risk factors ( age -56, HTN-1, DM-1, Vasc disease -1, CHF-1, Gender-1) for a CHADSVASc Score of 6   Past Medical History:  Diagnosis Date  . Amputation of left lower extremity below knee ([place])   . Amputation of right lower extremity below knee ([place])   . CAD (coronary artery disease)    a. 2004 Cardiac Arrest/CABG x 3 (LIMA->LAD, VG->OM, VG->RCA);  b. 06/2015 lexiscan MV: no significant ischemia, EF 48%, low risk->Med Rx.  . Chronic combined systolic and diastolic CHF (congestive heart failure) ([place])    a. 12/2014 Echo: EF 45-50%;  b. 08/2015 Echo: EF 45-50%, ant, antsept HK, mildly dil LA, nl RV, mild-mod TR, sev PAH (90mmHg).  . CKD (chronic kidney disease), stage IV ([place])   . CLL (chronic lymphocytic leukemia) ([place])   . Diabetes mellitus without complication ([place])   . Essential hypertension   . GERD (gastroesophageal reflux disease)    . Hiatal hernia   . History of cardiac arrest    a. 2004.  Marland Kitchen Hyperlipidemia   . Ischemic cardiomyopathy    a. s/p MDT ICD (originally had 6759 lead-->gen change and lead revision ~ 2012 @ [place]); b. 12/2014 Echo: EF 45-50%;  c.08/2015 Echo: EF 45-50%, ant, antsept HK. Nl RV.  Marland Kitchen PAD (peripheral artery disease) ([facility])    a. s/p BKA  . Persistent atrial fibrillation ([place])    a. Dx 12/2014.  CHA2DS2VASc = 6--> warfarin;  b. 09/2015 s/p DCCV-->on amio; c. s/p DCCV 04/25/16    Past Surgical History:  Procedure Laterality Date  . ABDOMINAL HYSTERECTOMY    . Amputation lower extremity bilaterally Bilateral   . CARDIAC CATHETERIZATION    . CARDIOVERSION N/A 10/09/2016   Procedure: CARDIOVERSION;  Surgeon: Lelon Perla, MD;  Location: [facility] ENDOSCOPY;  Service: Cardiovascular;  Laterality: N/A;  . CORONARY ARTERY BYPASS GRAFT    . ELECTROPHYSIOLOGIC STUDY N/A 10/07/2015   Procedure: CARDIOVERSION;  Surgeon: Minna Merritts, MD;  Location: [facility] ORS;  Service: Cardiovascular;  Laterality: N/A;  . ELECTROPHYSIOLOGIC STUDY N/A 04/25/2016   Procedure: Cardioversion;  Surgeon: Minna Merritts, MD;  Location: [facility] ORS;  Service: Cardiovascular;  Laterality: N/A;  . ELECTROPHYSIOLOGIC STUDY N/A 07/18/2016   Procedure: Atrial Fibrillation Ablation;  Surgeon: Thompson Grayer, MD;  Location: [place] CV LAB;  Service: Cardiovascular;  Laterality: N/A;  . IMPLANTABLE  CARDIOVERTER DEFIBRILLATOR IMPLANT  2005   [employer]tronic   . TEE WITHOUT CARDIOVERSION N/A 07/18/2016   Procedure: TRANSESOPHAGEAL ECHOCARDIOGRAM (TEE);  Surgeon: Sueanne Margarita, MD;  Location: [facility] ENDOSCOPY;  Service: Cardiovascular;  Laterality: N/A;  . TEE WITHOUT CARDIOVERSION N/A 10/09/2016   Procedure: TRANSESOPHAGEAL ECHOCARDIOGRAM (TEE);  Surgeon: Lelon Perla, MD;  Location: [facility] ENDOSCOPY;  Service: Cardiovascular;  Laterality: N/A;    Current Outpatient Prescriptions  Medication Sig Dispense Refill  . amitriptyline (ELAVIL) 25 MG tablet  Take 25 mg by mouth every evening.  11  . amLODipine (NORVASC) 10 MG tablet Take 10 mg by mouth daily.    Marland Kitchen atorvastatin (LIPITOR) 40 MG tablet Take 40 mg by mouth daily at 6 PM.   11  . carvedilol (COREG) 3.125 MG tablet TAKE 1 TABLET(3.125 MG) BY MOUTH TWICE DAILY 60 tablet 0  . Cholecalciferol (HM VITAMIN D3) 4000 UNITS CAPS Take 4,000 Units by mouth daily.    . furosemide (LASIX) 40 MG tablet TAKE 1 TABLET(40 MG) BY MOUTH TWICE DAILY 60 tablet 3  . insulin aspart (NOVOLOG) 100 UNIT/ML injection Inject 5-10 Units into the skin See admin instructions. 10 units into the skin before breakfast then 5 units before lunch then 10 units before supper (evening meal)    . insulin glargine (LANTUS) 100 UNIT/ML injection Inject 14-35 Units into the skin See admin instructions. 35 units into the skin before breakfast and 14 units before supper (evening meal)    . isosorbide mononitrate (IMDUR) 30 MG 24 hr tablet TAKE 1 TABLET(30 MG) BY MOUTH TWICE DAILY 60 tablet 3  . losartan (COZAAR) 100 MG tablet Take 100 mg by mouth every evening.   11  . Multiple Vitamins-Minerals (VITEYES AREDS FORMULA/LUTEIN) CAPS Take 1 capsule by mouth 2 (two) times daily.    . nitroGLYCERIN (NITROSTAT) 0.4 MG SL tablet Place 1 tablet (0.4 mg total) under the tongue every 5 (five) minutes as needed for chest pain. 30 tablet 0  . pantoprazole (PROTONIX) 40 MG tablet Take 40 mg by mouth at bedtime.    Marland Kitchen POLY-IRON 150 150 MG capsule Take 150 mg by mouth daily.  3  . potassium chloride SA (K-DUR,KLOR-CON) 20 MEQ tablet TAKE 1 TABLET BY MOUTH TWICE DAILY 180 tablet 3  . PREVIDENT 5000 DRY MOUTH 1.1 % GEL dental gel daily. Brush teeth with paste for at least 1 minute once a day  5  . sertraline (ZOLOFT) 100 MG tablet Take 100 mg by mouth at bedtime.   11  . vitamin C (ASCORBIC ACID) 500 MG tablet Take 500 mg by mouth daily.    Marland Kitchen warfarin (COUMADIN) 4 MG tablet Take 4 mg by mouth in the evening on Sun/Tues/Thurs and 6 mg on Mon/Wed/Fri/Sat  50 tablet 3   No current facility-administered medications for this visit.     Allergies  Allergen Reactions  . Zosyn [Piperacillin Sod-Tazobactam So] Other (See Comments)    Renal failure      Review of Systems negative except from HPI and PMH  Physical Exam BP 136/68   Pulse 75   Ht 5\' 8"  (1.727 m)   Wt 210 lb (95.3 kg)   SpO2 96%   BMI 31.93 kg/m  Well developed and well nourished in no acute distress sitting in wheel chair  HENT normal E scleral and icterus clear Neck Supple JVP flat; carotids brisk and full Clear to ausculation Regular rate and rhythm, no murmurs gallops or rub Soft with active bowel sounds No  clubbing cyanosis s/p B BKA Alert and oriented, grossly normal motor and sensory function Skin Warm and Dry  ECG Personally reviewed    >> sinus at 75  Assessment and  Plan  Atrial Fibrillation  ICD  The patient's device was interrogated.  The information was reviewed. No changes were made in the programming.     Ischemic cardiomyopathy  Aborted Cardiac Arrest   Chronic renal insufficiency   No intercurrent atrial fibrillation or flutter post ablation  Doing well  On Anticoagulation;  No bleeding issues   Continue warfarin  No intercurrent Ventricular tachycardia  Need to check renal function  This is the likely reason not for NOAC      Current medicines are reviewed at length with the patient today .  The patient does not  have concerns regarding medicines.

## 2017-02-16 NOTE — Patient Instructions (Signed)
Medication Instructions: - Your physician recommends that you continue on your current medications as directed. Please refer to the Current Medication list given to you today.  Labwork: - none ordered  Procedures/Testing: - none ordered  Follow-Up: - Your physician wants you to follow-up in: 1 year with Dr. Caryl Comes in the [place] office.  You will receive a reminder letter in the mail two months in advance. If you don't receive a letter, please call our office to schedule the follow-up appointment.  Any Additional Special Instructions Will Be Listed Below (If Applicable).     If you need a refill on your cardiac medications before your next appointment, please call your pharmacy.

## 2017-02-16 NOTE — Telephone Encounter (Signed)
Request for surgical clearance:  1. What type of surgery is being performed? colonoscopy   2. When is this surgery scheduled? Not scheduled at the moment    3. Are there any medications that need to be held prior to surgery and how long? Coumadin   4. Name of physician performing surgery? Not sure yet, it will be done at [facility]   5. What is your office phone and fax number? FAX : [phone] ATTN:Karen

## 2017-02-20 ENCOUNTER — Other Ambulatory Visit: Payer: Self-pay | Admitting: Cardiovascular Disease

## 2017-02-20 NOTE — Telephone Encounter (Signed)
Acceptable risk for surgery We would recommend stopping warfarin prior to her colonoscopy, 5 days

## 2017-02-21 ENCOUNTER — Other Ambulatory Visit: Payer: Self-pay | Admitting: Cardiovascular Disease

## 2017-02-21 NOTE — Telephone Encounter (Signed)
Clearance routed to number provided.  

## 2017-02-23 LAB — CUP PACEART INCLINIC DEVICE CHECK
Battery Voltage: 2.98 V
HIGH POWER IMPEDANCE MEASURED VALUE: 62 Ohm
HighPow Impedance: 304 Ohm
Implantable Lead Implant Date: 20120326
Implantable Lead Location: 753860
Implantable Lead Model: 6935
Implantable Pulse Generator Implant Date: 20120326
Lead Channel Pacing Threshold Pulse Width: 0.4 ms
Lead Channel Setting Sensing Sensitivity: 0.3 mV
MDC IDC MSMT LEADCHNL RV IMPEDANCE VALUE: 418 Ohm
MDC IDC MSMT LEADCHNL RV PACING THRESHOLD AMPLITUDE: 0.625 V
MDC IDC MSMT LEADCHNL RV SENSING INTR AMPL: 8 mV
MDC IDC SESS DTM: 20180608181459
MDC IDC SET LEADCHNL RV PACING AMPLITUDE: 2 V
MDC IDC SET LEADCHNL RV PACING PULSEWIDTH: 0.4 ms
MDC IDC STAT BRADY RV PERCENT PACED: 0.27 %

## 2017-02-28 ENCOUNTER — Ambulatory Visit (INDEPENDENT_AMBULATORY_CARE_PROVIDER_SITE_OTHER): Payer: Medicare HMO | Admitting: *Deleted

## 2017-02-28 DIAGNOSIS — I481 Persistent atrial fibrillation: Secondary | ICD-10-CM | POA: Diagnosis not present

## 2017-02-28 DIAGNOSIS — Z7901 Long term (current) use of anticoagulants: Secondary | ICD-10-CM | POA: Diagnosis not present

## 2017-02-28 DIAGNOSIS — I4819 Other persistent atrial fibrillation: Secondary | ICD-10-CM

## 2017-02-28 LAB — POCT INR: INR: 3.3

## 2017-03-13 ENCOUNTER — Other Ambulatory Visit: Payer: Self-pay | Admitting: Cardiovascular Disease

## 2017-03-20 ENCOUNTER — Other Ambulatory Visit: Payer: Self-pay | Admitting: Cardiovascular Disease

## 2017-03-21 ENCOUNTER — Ambulatory Visit (INDEPENDENT_AMBULATORY_CARE_PROVIDER_SITE_OTHER): Payer: Medicare HMO | Admitting: *Deleted

## 2017-03-21 DIAGNOSIS — I481 Persistent atrial fibrillation: Secondary | ICD-10-CM | POA: Diagnosis not present

## 2017-03-21 DIAGNOSIS — Z7901 Long term (current) use of anticoagulants: Secondary | ICD-10-CM | POA: Diagnosis not present

## 2017-03-21 DIAGNOSIS — I4819 Other persistent atrial fibrillation: Secondary | ICD-10-CM

## 2017-03-21 LAB — POCT INR: INR: 2

## 2017-03-23 NOTE — Progress Notes (Deleted)
Patient ID: Caroline Hernandez, female   DOB: [birth year], 71 y.o.   MRN: [id number] Cardiology Office Note  Date:  03/23/2017   ID:  Caroline Hernandez, DOB [birth year], MRN [id number]  PCP:  System, Provider Not In   No chief complaint on file.   HPI:  71 y.o. female with  coronary artery disease,  h/o bypass surgery in 2004,  EF 40 to 45% CLL,   diabetes,  below the knee amputation b/l for PAD,  atrial fibrillation  April 2016, June 2016,  acute on chronic diastolic CHF initially April 2016,    bilateral prosthesis  ICD admitted to the hospital with diastolic CHF/leg swelling, abdominal bloating, angina, who presents For routine follow-up of her atrial fibrillation   ablation for atrial fibrillation 07/18/2016 developed atrial fibrillation the following day   In follow-up today she reports that she feels well with no complaints Takes Lasix 40 mg twice a day, denies excessive leg edema Recent INR 1.7 five days ago , dose changed  Previous testing reviewed with her TEE: 40% to 45% Mild to mod TR  07/18/2016: 1.  Electrophysiology study and radiofrequency catheter ablation on 07/18/16 by Dr Thompson Grayer.  This study demonstrated  Successful electrical isolation and anatomical encircling of all four pulmonary veins with radiofrequency current.  Additional ablation along the posterior wall of the left atrium   Atrial fibrillation successfully cardioverted to sinus rhythm. Prolonged AV delay noted.  INR 1.7 5 days ago (dose adjusted) INR 2.06 07/19/16  EKG on today's visit shows atrial fibrillation, ventricular rate 51 bpm intraventricular conduction delay  Other past medical history reviewed She underwent cardioversion at the end of January 2017 for persistent atrial fibrillation Maintaining normal sinus rhythm early February (amiodarone decreased down to 100 mg at that time), and in March when seen by Dr. Caryl Comes Reports having some shortness of breath, worsening leg swelling over  the past month or 2 Weight has increased from low 180 pounds up to 191 pounds at which time she increased Lasix up to 40 mg twice a day Mild improvement in her weight, down 4 pounds Still with significant fatigue, leg swelling, shortness of breath on exertion  Recent lab work showing hemoglobin A1c 6.7, creatinine 2.4  EKG on today's visit shows atrial fibrillation with ventricular rate 50 bpm, left anterior fascicular block  History of coronary disease with cardiac arrest in 2004, taken for three-vessel CABG, LIMA to the LAD, vein graft to the RCA, vein graft to an OM. No stenting or intervention since that time.  chronic infections of her legs leading to amputation. In hindsight family wonders if this could've been exacerbated by CLL.   She reports having problems in the past with anemia. Started on iron supplementation with improvement  PMH:   has a past medical history of Amputation of left lower extremity below knee ([place]); Amputation of right lower extremity below knee [facility]); CAD (coronary artery disease); Chronic combined systolic and diastolic CHF (congestive heart failure) ([place]); CKD (chronic kidney disease), stage IV (HCC); CLL (chronic lymphocytic leukemia) ([place]); Diabetes mellitus without complication ([place]); Essential hypertension; GERD (gastroesophageal reflux disease); Hiatal hernia; History of cardiac arrest; Hyperlipidemia; Ischemic cardiomyopathy; PAD (peripheral artery disease) ([place]); and Persistent atrial fibrillation ([place]).  PSH:    Past Surgical History:  Procedure Laterality Date  . ABDOMINAL HYSTERECTOMY    . Amputation lower extremity bilaterally Bilateral   . CARDIAC CATHETERIZATION    . CARDIOVERSION N/A 10/09/2016   Procedure: CARDIOVERSION;  Surgeon: Lelon Perla, MD;  Location: [facility] ENDOSCOPY;  Service: Cardiovascular;  Laterality: N/A;  . CORONARY ARTERY BYPASS GRAFT    . ELECTROPHYSIOLOGIC STUDY N/A 10/07/2015   Procedure: CARDIOVERSION;  Surgeon: Minna Merritts, MD;  Location: [facility] ORS;  Service: Cardiovascular;  Laterality: N/A;  . ELECTROPHYSIOLOGIC STUDY N/A 04/25/2016   Procedure: Cardioversion;  Surgeon: Minna Merritts, MD;  Location: [facility] ORS;  Service: Cardiovascular;  Laterality: N/A;  . ELECTROPHYSIOLOGIC STUDY N/A 07/18/2016   Procedure: Atrial Fibrillation Ablation;  Surgeon: Thompson Grayer, MD;  Location: [place] CV LAB;  Service: Cardiovascular;  Laterality: N/A;  . IMPLANTABLE CARDIOVERTER DEFIBRILLATOR IMPLANT  2005   [employer]tr[employer]   . TEE WITHOUT CARDIOVERSION N/A 07/18/2016   Procedure: TRANSESOPHAGEAL ECHOCARDIOGRAM (TEE);  Surgeon: Sueanne Margarita, MD;  Location: [facility] ENDOSCOPY;  Service: Cardiovascular;  Laterality: N/A;  . TEE WITHOUT CARDIOVERSION N/A 10/09/2016   Procedure: TRANSESOPHAGEAL ECHOCARDIOGRAM (TEE);  Surgeon: Lelon Perla, MD;  Location: [facility] ENDOSCOPY;  Service: Cardiovascular;  Laterality: N/A;    Current Outpatient Prescriptions  Medication Sig Dispense Refill  . amitriptyline (ELAVIL) 25 MG tablet Take 25 mg by mouth every evening.  11  . amLODipine (NORVASC) 10 MG tablet Take 10 mg by mouth daily.    Marland Kitchen atorvastatin (LIPITOR) 40 MG tablet Take 40 mg by mouth daily at 6 PM.   11  . carvedilol (COREG) 3.125 MG tablet TAKE 1 TABLET BY MOUTH TWICE DAILY 60 tablet 0  . Cholecalciferol (HM VITAMIN D3) 4000 UNITS CAPS Take 4,000 Units by mouth daily.    . furosemide (LASIX) 40 MG tablet TAKE 1 TABLET(40 MG) BY MOUTH TWICE DAILY 60 tablet 0  . insulin aspart (NOVOLOG) 100 UNIT/ML injection Inject 5-10 Units into the skin See admin instructions. 10 units into the skin before breakfast then 5 units before lunch then 10 units before supper (evening meal)    . insulin glargine (LANTUS) 100 UNIT/ML injection Inject 14-35 Units into the skin See admin instructions. 35 units into the skin before breakfast and 14 units before supper (evening meal)    . isosorbide mononitrate (IMDUR) 30 MG 24 hr tablet TAKE 1 TABLET(30 MG) BY  MOUTH TWICE DAILY 60 tablet 0  . losartan (COZAAR) 100 MG tablet Take 100 mg by mouth every evening.   11  . Multiple Vitamins-Minerals (VITEYES AREDS FORMULA/LUTEIN) CAPS Take 1 capsule by mouth 2 (two) times daily.    . nitroGLYCERIN (NITROSTAT) 0.4 MG SL tablet Place 1 tablet (0.4 mg total) under the tongue every 5 (five) minutes as needed for chest pain. 30 tablet 0  . pantoprazole (PROTONIX) 40 MG tablet Take 40 mg by mouth at bedtime.    Marland Kitchen POLY-IRON 150 150 MG capsule Take 150 mg by mouth daily.  3  . potassium chloride SA (K-DUR,KLOR-CON) 20 MEQ tablet TAKE 1 TABLET BY MOUTH TWICE DAILY 180 tablet 3  . PREVIDENT 5000 DRY MOUTH 1.1 % GEL dental gel daily. Brush teeth with paste for at least 1 minute once a day  5  . sertraline (ZOLOFT) 100 MG tablet Take 100 mg by mouth at bedtime.   11  . vitamin C (ASCORBIC ACID) 500 MG tablet Take 500 mg by mouth daily.    Marland Kitchen warfarin (COUMADIN) 4 MG tablet Take 4 mg by mouth in the evening on Sun/Tues/Thurs and 6 mg on Mon/Wed/Fri/Sat 50 tablet 3   No current facility-administered medications for this visit.      Allergies:   Zosyn [piperacillin sod-tazobactam so]   Social History:  The patient  reports that she has never smoked. She has never used smokeless tobacco. She reports that she does not drink alcohol or use drugs.   Family History:   family history includes Atrial fibrillation in her mother; CAD in her mother; Diabetes in her father and mother; Lung cancer in her father.    Review of Systems: Review of Systems  Constitutional: Negative.        Weight gain  Respiratory: Positive for shortness of breath.   Cardiovascular: Positive for leg swelling.  Gastrointestinal: Negative.   Musculoskeletal: Negative.   Neurological: Negative.   Psychiatric/Behavioral: Negative.   All other systems reviewed and are negative.    PHYSICAL EXAM: VS:  There were no vitals taken for this visit. , BMI There is no height or weight on file to  calculate BMI. GEN: Well nourished, well developed, in no acute distress, obese  HEENT: normal  Neck: no JVD, carotid bruits, or masses Cardiac: IRRR; bradycardic, no murmurs, rubs, or gallops,no edema  Respiratory:  clear to auscultation bilaterally, normal work of breathing GI: soft, nontender, nondistended, + BS MS: no deformity or atrophy , Bilateral prosthesis Skin: warm and dry, no rash Neuro:  Strength and sensation are intact Psych: euthymic mood, full affect    Recent Labs: 10/02/2016: BUN 33; Creatinine, Ser 2.10; Hemoglobin 12.7; Platelets 193; Potassium 4.5; Sodium 139    Lipid Panel Lab Results  Component Value Date   CHOL 91 06/13/2015   HDL 25 (L) 06/13/2015   LDLCALC 40 06/13/2015   TRIG 128 06/13/2015      Wt Readings from Last 3 Encounters:  02/16/17 210 lb (95.3 kg)  10/02/16 199 lb (90.3 kg)  08/23/16 188 lb (85.3 kg)       ASSESSMENT AND PLAN:   SOB (shortness of breath) -  She reports breathing is stable on Lasix 40 twice a day Currently atrial fibrillation is not causing her considerable symptoms She has follow-up with EP in 2 weeks and again in 10/2016  Persistent atrial fibrillation (HCC) -  Recent ablation, converted to atrial fibrillation the next day Remains in atrial fibrillation today INR 1.7 five days ago Recommended one month of anticoagulation with INR 2.0 or greater At that time could consider pharmacologic or DCCV  Leg swelling Dramatic improvement in her leg swelling on Lasix 40 twice a day She will continue potassium 20 daily  Coronary artery disease involving native coronary artery of native heart with angina pectoris with documented spasm (HCC) Currently with no symptoms of angina. No further workup at this time. Continue current medication regimen.  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 She reports blood pressure well controlled at home  No changes to her medications  Bradycardia She is asymptomatic, we'll continue low-dose carvedilol for now   Total encounter time more than 25 minutes  Greater than 50% was spent in counseling and coordination of care with the patient   Disposition:   F/U  6 months   No orders of the defined types were placed in this encounter.    Signed, Esmond Plants, M.D., Ph.D. 03/23/2017  [place], [place]

## 2017-03-27 ENCOUNTER — Ambulatory Visit: Payer: Medicare HMO | Admitting: Cardiovascular Disease

## 2017-04-11 ENCOUNTER — Other Ambulatory Visit: Payer: Self-pay | Admitting: Cardiovascular Disease

## 2017-04-18 ENCOUNTER — Ambulatory Visit (INDEPENDENT_AMBULATORY_CARE_PROVIDER_SITE_OTHER): Payer: Medicare HMO

## 2017-04-18 DIAGNOSIS — I481 Persistent atrial fibrillation: Secondary | ICD-10-CM

## 2017-04-18 DIAGNOSIS — Z7901 Long term (current) use of anticoagulants: Secondary | ICD-10-CM | POA: Diagnosis not present

## 2017-04-18 DIAGNOSIS — I4819 Other persistent atrial fibrillation: Secondary | ICD-10-CM

## 2017-04-18 LAB — POCT INR: INR: 2.1

## 2017-04-24 ENCOUNTER — Other Ambulatory Visit: Payer: Self-pay | Admitting: Cardiovascular Disease

## 2017-04-25 NOTE — Telephone Encounter (Signed)
Please review for refill, thanks ! 

## 2017-05-01 NOTE — Progress Notes (Signed)
Patient ID: Caroline Hernandez, female   DOB: 1946/09/09, 71 y.o.   MRN: [id number] Cardiology Office Note  Date:  05/02/2017   ID:  Caroline Hernandez, DOB Nov 27, 1945, MRN [id number]  PCP:  System, Provider Not In   Chief Complaint  Patient presents with  . other    6 month follow up. Patient states she is doing good. Meds reviewed verbally with patient.     HPI:  71 y.o. 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,  atrial fibrillation starting possibly in April 2016,  noted again June 2016,  acute on chronic diastolic CHF initially April 2016,   admitted to the hospital with diastolic CHF/leg swelling,  abdominal bloating, angina,  She has bilateral prosthesis She has ICD who presents For routine follow-up of her atrial fibrillation  In follow-up today she reports that she is doing well, denies any significant arrhythmia ablation for atrial fibrillation 07/18/2016 10/09/2016:  Cardioversion No atrial fib since then per the patient Denies any significant leg swelling, weight stable  CR 1.9  BUN 32,  In 11/2016 on Lasix 40 twice a day Sugars high  Denies any stress, BP elevated today BP at home 130s   Previous testing reviewed with her TEE: 40% to 45% Mild to mod TR  EKG on today's visit shows atrial fibrillation, ventricular rate 51 bpm intraventricular conduction delay  07/18/2016: 1.  Electrophysiology study and radiofrequency catheter ablation on 07/18/16 by Dr Thompson Grayer.  This study demonstrated  Successful electrical isolation and anatomical encircling of all four pulmonary veins with radiofrequency current.  Additional ablation along the posterior wall of the left atrium   Atrial fibrillation successfully cardioverted to sinus rhythm. Prolonged AV delay noted.  Other past medical history reviewed She underwent cardioversion at the end of January 2017 for persistent atrial  fibrillation Maintaining normal sinus rhythm early February (amiodarone decreased down to 100 mg at that time), and in March when seen by Dr. Caryl Comes Reports having some shortness of breath, worsening leg swelling over the past month or 2 Weight has increased from low 180 pounds up to 191 pounds at which time she increased Lasix up to 40 mg twice a day Mild improvement in her weight, down 4 pounds Still with significant fatigue, leg swelling, shortness of breath on exertion  EKG on today's visit shows atrial fibrillation with ventricular rate 50 bpm, left anterior fascicular block  History of coronary disease with cardiac arrest in 2004, taken for three-vessel CABG, LIMA to the LAD, vein graft to the RCA, vein graft to an OM. No stenting or intervention since that time.  chronic infections of her legs leading to amputation. In hindsight family wonders if this could've been exacerbated by CLL.   She reports having problems in the past with anemia. Started on iron supplementation with improvement  PMH:   has a past medical history of Amputation of left lower extremity below knee ([place]); Amputation of right lower extremity below knee [facility]); CAD (coronary artery disease); Chronic combined systolic and diastolic CHF (congestive heart failure) ([place]); CKD (chronic kidney disease), stage IV (HCC); CLL (chronic lymphocytic leukemia) ([place]); Diabetes mellitus without complication ([place]); Essential hypertension; GERD (gastroesophageal reflux disease); Hiatal hernia; History of cardiac arrest; Hyperlipidemia; Ischemic cardiomyopathy; PAD (peripheral artery disease) ([place]); and Persistent atrial fibrillation ([place]).  PSH:    Past Surgical History:  Procedure Laterality Date  . ABDOMINAL HYSTERECTOMY    .  Amputation lower extremity bilaterally Bilateral   . CARDIAC CATHETERIZATION    . CARDIOVERSION N/A 10/09/2016   Procedure: CARDIOVERSION;  Surgeon: Lelon Perla, MD;  Location: [facility] ENDOSCOPY;  Service:  Cardiovascular;  Laterality: N/A;  . CORONARY ARTERY BYPASS GRAFT    . ELECTROPHYSIOLOGIC STUDY N/A 10/07/2015   Procedure: CARDIOVERSION;  Surgeon: Minna Merritts, MD;  Location: [facility] ORS;  Service: Cardiovascular;  Laterality: N/A;  . ELECTROPHYSIOLOGIC STUDY N/A 04/25/2016   Procedure: Cardioversion;  Surgeon: Minna Merritts, MD;  Location: [facility] ORS;  Service: Cardiovascular;  Laterality: N/A;  . ELECTROPHYSIOLOGIC STUDY N/A 07/18/2016   Procedure: Atrial Fibrillation Ablation;  Surgeon: Thompson Grayer, MD;  Location: [place] CV LAB;  Service: Cardiovascular;  Laterality: N/A;  . IMPLANTABLE CARDIOVERTER DEFIBRILLATOR IMPLANT  2005   [employer]   . TEE WITHOUT CARDIOVERSION N/A 07/18/2016   Procedure: TRANSESOPHAGEAL ECHOCARDIOGRAM (TEE);  Surgeon: Sueanne Margarita, MD;  Location: [facility] ENDOSCOPY;  Service: Cardiovascular;  Laterality: N/A;  . TEE WITHOUT CARDIOVERSION N/A 10/09/2016   Procedure: TRANSESOPHAGEAL ECHOCARDIOGRAM (TEE);  Surgeon: Lelon Perla, MD;  Location: [facility] ENDOSCOPY;  Service: Cardiovascular;  Laterality: N/A;    Current Outpatient Prescriptions  Medication Sig Dispense Refill  . amitriptyline (ELAVIL) 25 MG tablet Take 25 mg by mouth every evening.  11  . amLODipine (NORVASC) 10 MG tablet Take 10 mg by mouth daily.    Marland Kitchen atorvastatin (LIPITOR) 40 MG tablet Take 40 mg by mouth daily at 6 PM.   11  . carvedilol (COREG) 3.125 MG tablet TAKE 1 TABLET BY MOUTH TWICE DAILY 60 tablet 0  . Cholecalciferol (HM VITAMIN D3) 4000 UNITS CAPS Take 4,000 Units by mouth daily.    . furosemide (LASIX) 40 MG tablet TAKE 1 TABLET(40 MG) BY MOUTH TWICE DAILY 60 tablet 0  . insulin aspart (NOVOLOG) 100 UNIT/ML injection Inject 5-10 Units into the skin See admin instructions. 10 units into the skin before breakfast then 5 units before lunch then 10 units before supper (evening meal)    . insulin glargine (LANTUS) 100 UNIT/ML injection Inject 14-35 Units into the skin See admin instructions.  35 units into the skin before breakfast and 14 units before supper (evening meal)    . isosorbide mononitrate (IMDUR) 30 MG 24 hr tablet TAKE 1 TABLET(30 MG) BY MOUTH TWICE DAILY 60 tablet 0  . losartan (COZAAR) 100 MG tablet Take 100 mg by mouth every evening.   11  . Multiple Vitamins-Minerals (VITEYES AREDS FORMULA/LUTEIN) CAPS Take 1 capsule by mouth 2 (two) times daily.    . nitroGLYCERIN (NITROSTAT) 0.4 MG SL tablet Place 1 tablet (0.4 mg total) under the tongue every 5 (five) minutes as needed for chest pain. 30 tablet 0  . pantoprazole (PROTONIX) 40 MG tablet Take 40 mg by mouth at bedtime.    Marland Kitchen POLY-IRON 150 150 MG capsule Take 150 mg by mouth daily.  3  . potassium chloride SA (K-DUR,KLOR-CON) 20 MEQ tablet TAKE 1 TABLET BY MOUTH TWICE DAILY 180 tablet 3  . PREVIDENT 5000 DRY MOUTH 1.1 % GEL dental gel daily. Brush teeth with paste for at least 1 minute once a day  5  . sertraline (ZOLOFT) 100 MG tablet Take 100 mg by mouth at bedtime.   11  . vitamin C (ASCORBIC ACID) 500 MG tablet Take 500 mg by mouth daily.    Marland Kitchen warfarin (COUMADIN) 4 MG tablet TAKE AS DIRECTED BY COUMADIN CLINIC 50 tablet 3   No current facility-administered medications  for this visit.      Allergies:   Zosyn [piperacillin sod-tazobactam so]   Social History:  The patient  reports that she has never smoked. She has never used smokeless tobacco. She reports that she does not drink alcohol or use drugs.   Family History:   family history includes Atrial fibrillation in her mother; CAD in her mother; Diabetes in her father and mother; Lung cancer in her father.    Review of Systems: Review of Systems  Constitutional: Negative.   Cardiovascular: Negative.   Gastrointestinal: Negative.   Musculoskeletal: Negative.   Neurological: Negative.   Psychiatric/Behavioral: Negative.   All other systems reviewed and are negative.    PHYSICAL EXAM: VS:  BP (!) 178/80 (BP Location: Left Arm, Patient Position:  Sitting, Cuff Size: Normal)   Pulse 79   Ht 5\' 8"  (1.727 m)   Wt 211 lb (95.7 kg)   BMI 32.08 kg/m  , BMI Body mass index is 32.08 kg/m. GEN: Well nourished, well developed, in no acute distress, obese  HEENT: normal  Neck: no JVD, carotid bruits, or masses Cardiac: IRRR; bradycardic, no murmurs, rubs, or gallops,no edema  Respiratory:  clear to auscultation bilaterally, normal work of breathing GI: soft, nontender, nondistended, + BS MS: no deformity or atrophy , Bilateral prosthesis Skin: warm and dry, no rash Neuro:  Strength and sensation are intact Psych: euthymic mood, full affect    Recent Labs: 10/02/2016: BUN 33; Creatinine, Ser 2.10; Hemoglobin 12.7; Platelets 193; Potassium 4.5; Sodium 139    Lipid Panel Lab Results  Component Value Date   CHOL 91 06/13/2015   HDL 25 (L) 06/13/2015   LDLCALC 40 06/13/2015   TRIG 128 06/13/2015      Wt Readings from Last 3 Encounters:  05/02/17 211 lb (95.7 kg)  02/16/17 210 lb (95.3 kg)  10/02/16 199 lb (90.3 kg)       ASSESSMENT AND PLAN:   SOB (shortness of breath) -  breathing is stable on Lasix 40 twice a day Maintaining normal sinus rhythm Repeat lab work pending with primary care  Persistent atrial fibrillation ([place]) -  History of ablation, cardioversion January 2018, has remained in normal sinus rhythm  Leg swelling Minimal if any leg swelling on Lasix 40 twice a day Stable BMP  Coronary artery disease involving native coronary artery of native heart with angina pectoris with documented spasm ([place]) Currently with no symptoms of angina. No further workup at this time. Continue current medication regimen.  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 Currently with no symptoms of angina  Systolic and diastolic CHF, acute on chronic ([place]) Appears  euvolemic on today's visit Ejection fraction 40-45% No echocardiogram since she has been maintaining normal sinus rhythm  Essential  hypertension Blood pressure mildly elevated. She reports blood pressure well controlled at home  No changes to her medications    Total encounter time more than 25 minutes  Greater than 50% was spent in counseling and coordination of care with the patient   Disposition:   F/U  12 months   Orders Placed This Encounter  Procedures  . EKG 12-Lead     Signed, Esmond Plants, M.D., Ph.D. 05/02/2017  [place], [place]

## 2017-05-02 ENCOUNTER — Ambulatory Visit (INDEPENDENT_AMBULATORY_CARE_PROVIDER_SITE_OTHER): Payer: Medicare HMO | Admitting: Cardiovascular Disease

## 2017-05-02 ENCOUNTER — Encounter: Payer: Self-pay | Admitting: Cardiovascular Disease

## 2017-05-02 ENCOUNTER — Ambulatory Visit (INDEPENDENT_AMBULATORY_CARE_PROVIDER_SITE_OTHER): Payer: Medicare HMO

## 2017-05-02 VITALS — BP 178/80 | HR 79 | Ht 68.0 in | Wt 211.0 lb

## 2017-05-02 DIAGNOSIS — I481 Persistent atrial fibrillation: Secondary | ICD-10-CM

## 2017-05-02 DIAGNOSIS — I5043 Acute on chronic combined systolic (congestive) and diastolic (congestive) heart failure: Secondary | ICD-10-CM | POA: Diagnosis not present

## 2017-05-02 DIAGNOSIS — I25111 Atherosclerotic heart disease of native coronary artery with angina pectoris with documented spasm: Secondary | ICD-10-CM

## 2017-05-02 DIAGNOSIS — I4819 Other persistent atrial fibrillation: Secondary | ICD-10-CM

## 2017-05-02 DIAGNOSIS — R0602 Shortness of breath: Secondary | ICD-10-CM | POA: Diagnosis not present

## 2017-05-02 DIAGNOSIS — Z7901 Long term (current) use of anticoagulants: Secondary | ICD-10-CM | POA: Diagnosis not present

## 2017-05-02 DIAGNOSIS — N184 Chronic kidney disease, stage 4 (severe): Secondary | ICD-10-CM | POA: Diagnosis not present

## 2017-05-02 DIAGNOSIS — I209 Angina pectoris, unspecified: Secondary | ICD-10-CM

## 2017-05-02 DIAGNOSIS — I10 Essential (primary) hypertension: Secondary | ICD-10-CM | POA: Diagnosis not present

## 2017-05-02 LAB — POCT INR: INR: 1.9

## 2017-05-02 NOTE — Patient Instructions (Addendum)
Goal LDL <70 Goal total chol <150   Medication Instructions:   No medication changes made  Labwork:   Testing/Procedures:  No further testing at this time   Follow-Up: It was a pleasure seeing you in the office today. Please call us if you have new issues that need to be addressed before your next appt.  [phone]  Your physician wants you to follow-up in: 12 months.  You will receive a reminder letter in the mail two months in advance. If you don't receive a letter, please call our office to schedule the follow-up appointment.  If you need a refill on your cardiac medications before your next appointment, please call your pharmacy.

## 2017-05-08 ENCOUNTER — Other Ambulatory Visit: Payer: Self-pay | Admitting: Cardiovascular Disease

## 2017-05-21 ENCOUNTER — Telehealth: Payer: Self-pay | Admitting: Cardiology

## 2017-05-21 ENCOUNTER — Encounter: Payer: Medicare HMO | Admitting: *Deleted

## 2017-05-21 NOTE — Telephone Encounter (Signed)
LMOVM reminding pt to send remote transmission.   

## 2017-05-25 ENCOUNTER — Encounter: Payer: Self-pay | Admitting: Cardiology

## 2017-05-29 ENCOUNTER — Other Ambulatory Visit: Payer: Self-pay | Admitting: Cardiovascular Disease

## 2017-06-11 ENCOUNTER — Ambulatory Visit (INDEPENDENT_AMBULATORY_CARE_PROVIDER_SITE_OTHER): Payer: Medicare HMO

## 2017-06-11 DIAGNOSIS — I4819 Other persistent atrial fibrillation: Secondary | ICD-10-CM

## 2017-06-11 DIAGNOSIS — Z7901 Long term (current) use of anticoagulants: Secondary | ICD-10-CM

## 2017-06-11 DIAGNOSIS — I481 Persistent atrial fibrillation: Secondary | ICD-10-CM | POA: Diagnosis not present

## 2017-06-11 LAB — POCT INR: INR: 2.3

## 2017-06-12 ENCOUNTER — Other Ambulatory Visit: Payer: Self-pay | Admitting: Cardiovascular Disease

## 2017-07-30 ENCOUNTER — Ambulatory Visit (INDEPENDENT_AMBULATORY_CARE_PROVIDER_SITE_OTHER): Payer: Medicare HMO

## 2017-07-30 DIAGNOSIS — Z7901 Long term (current) use of anticoagulants: Secondary | ICD-10-CM | POA: Diagnosis not present

## 2017-07-30 DIAGNOSIS — I481 Persistent atrial fibrillation: Secondary | ICD-10-CM | POA: Diagnosis not present

## 2017-07-30 DIAGNOSIS — I4819 Other persistent atrial fibrillation: Secondary | ICD-10-CM

## 2017-07-30 LAB — POCT INR: INR: 2.2

## 2017-07-30 NOTE — Patient Instructions (Signed)
Continue same dosage 6mg  daily except 4mg  on Sundays, Tuesdays, and Thursdays.   Call [phone] with any medication changes. Recheck in 6 weeks.

## 2017-08-21 ENCOUNTER — Other Ambulatory Visit: Payer: Self-pay | Admitting: Cardiovascular Disease

## 2017-08-22 NOTE — Telephone Encounter (Signed)
I left a message for the patient to call and clarify how she is taking her amlodipine.

## 2017-08-22 NOTE — Telephone Encounter (Signed)
Please advise if ok to refill. 

## 2017-08-24 NOTE — Telephone Encounter (Signed)
Pt returning our call °Please call back ° °

## 2017-08-24 NOTE — Telephone Encounter (Signed)
S/w patient's daughter, ok per DPR. She clarified that patient is taking amlodipine 10 mg by mouth once a day. Medication refilled.

## 2017-08-24 NOTE — Telephone Encounter (Signed)
Please see note below. 

## 2017-09-12 ENCOUNTER — Other Ambulatory Visit: Payer: Self-pay | Admitting: Cardiovascular Disease

## 2017-09-12 ENCOUNTER — Ambulatory Visit (INDEPENDENT_AMBULATORY_CARE_PROVIDER_SITE_OTHER): Payer: Medicare HMO

## 2017-09-12 DIAGNOSIS — Z7901 Long term (current) use of anticoagulants: Secondary | ICD-10-CM

## 2017-09-12 DIAGNOSIS — I481 Persistent atrial fibrillation: Secondary | ICD-10-CM | POA: Diagnosis not present

## 2017-09-12 DIAGNOSIS — I4819 Other persistent atrial fibrillation: Secondary | ICD-10-CM

## 2017-09-12 LAB — POCT INR: INR: 1.9

## 2017-09-12 NOTE — Patient Instructions (Signed)
Please take 2 tablets tonight, then continue same dosage 6mg  daily except 4mg  on Sundays, Tuesdays, and Thursdays.   Recheck in 5 weeks.

## 2017-09-13 NOTE — Telephone Encounter (Signed)
Refill Request.  

## 2017-09-17 ENCOUNTER — Other Ambulatory Visit: Payer: Self-pay | Admitting: Cardiovascular Disease

## 2017-10-15 ENCOUNTER — Telehealth: Payer: Self-pay | Admitting: Cardiovascular Disease

## 2017-10-15 ENCOUNTER — Ambulatory Visit (INDEPENDENT_AMBULATORY_CARE_PROVIDER_SITE_OTHER): Payer: Medicare HMO

## 2017-10-15 DIAGNOSIS — Z7901 Long term (current) use of anticoagulants: Secondary | ICD-10-CM | POA: Diagnosis not present

## 2017-10-15 DIAGNOSIS — I481 Persistent atrial fibrillation: Secondary | ICD-10-CM

## 2017-10-15 DIAGNOSIS — I4819 Other persistent atrial fibrillation: Secondary | ICD-10-CM

## 2017-10-15 LAB — POCT INR: INR: 2.1

## 2017-10-15 NOTE — Telephone Encounter (Signed)
Would recommend EKG through nurse visit If normal sinus rhythm would be able to stop anticoagulation for 5 days prior to procedure No further testing would be indicated

## 2017-10-15 NOTE — Telephone Encounter (Signed)
Patient in today to have her coumadin checked. She is having colonoscopy on Monday 10/22/17 and needs cardiac clearance. She denies any changes since last office visit and coumadin was checked and dosed pending procedure. Previous clearance below and will route to Dr. Rockey Situ for further review.    Acceptable risk for surgery We would recommend stopping warfarin prior to her colonoscopy, 5 days          Electronically signed by Minna Merritts, MD at 02/20/2017 7:09 PM

## 2017-10-15 NOTE — Patient Instructions (Signed)
Please continue same dosage 6mg  daily except 4mg  on Sundays, Tuesdays, and Thursdays.   Per GI, please hold coumadin x 5 days prior to colonoscopy on 2/11. On day of procedure, if ok'd by GI, please resume coumadin that evening, w/ extra 1/2 tablet for 2 days. Recheck on 2/18.

## 2017-10-16 NOTE — Telephone Encounter (Signed)
Left detailed voicemail message that I was calling because Dr. Rockey Situ would like EKG prior to her procedure for clearance and that I would have scheduling reach out and to call back if she should have any further questions.

## 2017-10-17 NOTE — Telephone Encounter (Signed)
Schedule 2/8 at 9

## 2017-10-17 NOTE — Telephone Encounter (Signed)
Spoke with patients daughter per release form and reviewed that Dr. Rockey Situ would like EKG prior to her procedure for clearance. Advised that I would have someone from scheduling give her a call to set that up and she verbalized understanding with no further questions at this time.

## 2017-10-19 ENCOUNTER — Telehealth: Payer: Self-pay | Admitting: Cardiovascular Disease

## 2017-10-19 ENCOUNTER — Ambulatory Visit (INDEPENDENT_AMBULATORY_CARE_PROVIDER_SITE_OTHER): Payer: Medicare HMO | Admitting: *Deleted

## 2017-10-19 VITALS — BP 158/70 | HR 73 | Resp 18 | Ht 68.0 in

## 2017-10-19 DIAGNOSIS — I481 Persistent atrial fibrillation: Secondary | ICD-10-CM | POA: Diagnosis not present

## 2017-10-19 DIAGNOSIS — I4819 Other persistent atrial fibrillation: Secondary | ICD-10-CM

## 2017-10-19 NOTE — Patient Instructions (Signed)
Medication Instructions: - Your physician recommends that you continue on your current medications as directed. Please refer to the Current Medication list given to you today.  Labwork: - none ordered  Procedures/Testing: - none ordered  Follow-Up: - Your physician recommends that you follow up as scheduled with Dr. Rockey Situ   Any Additional Special Instructions Will Be Listed Below (If Applicable).     If you need a refill on your cardiac medications before your next appointment, please call your pharmacy.

## 2017-10-19 NOTE — Progress Notes (Signed)
1.) Reason for visit: EKG (clearance for colonoscopy)   2.) Name of MD requesting visit: Gollan  3.) H&P: Patient with a history of atrial fibrillation- she had an a-fib ablation with Dr. Rayann Heman on 07/28/16. She underwent DCCV in 09/2016 for a-fib. When seen by Dr. Rockey Situ on 05/02/17, the patient was in sinus rhythm with 1st degree AV block. The patient presents today at the request of Dr. Rockey Situ for EKG follow up. She is pending clearance for a colonoscopy at [facility] on Monday 10/22/17. Per previous phone note dated 10/15/17:  Minna Merritts, MD    6:58 PM  Note    Would recommend EKG through nurse visit If normal sinus rhythm would be able to stop anticoagulation for 5 days prior to procedure No further testing would be indicated        4.) ROS related to problem: The patient is without complaints today- she reports having already stopped her coumadin on Wednesday 10/17/17.     5.) Assessment and plan per MD: EKG today appears to be normal sinus rhythm with marked 1st degree AV block- HR 73 bpm. The patient's BP today is 158/70- this has been about the range for her BP at home- she is tracking this and will bring her readings in when she comes to see Dr. Rockey Situ on 10/31/17. Will review EKG from today with Dr. Fletcher Anon (DOD) when he is in the office. The patient was given a copy of her EKG to take with her- she did not have the contact number tor the physician doing her procedure- only [facility].   EKG reviewed with Christell Faith, PA- confirms EKG is normal sinus rhythm.

## 2017-10-19 NOTE — Telephone Encounter (Signed)
Copy of phone note faxed to Dr. Jerilynn Birkenhead at  [phone]- confirmation received.

## 2017-10-19 NOTE — Telephone Encounter (Signed)
October 15, 2017  Minna Merritts, MD  to Caroline Hernandez Triage   6:58 PM  Note    Would recommend EKG through nurse visit If normal sinus rhythm would be able to stop anticoagulation for 5 days prior to procedure No further testing would be indicated     Patient in office today for nurse visit- EKG reviewed with Christell Faith, PA- confirms the patient is in normal sinus rhythm- HR- 73.  Per the patient, she already stopped warfarin on Wednesday.  As patient is in normal sinus rhythm, will notify the patient gastroenterologist, Dr. Jerilynn Birkenhead, at [facility] that the patient may proceed with colonoscopy per Dr. Rockey Situ.   Phone: [phone] Fax: [phone]

## 2017-10-21 ENCOUNTER — Other Ambulatory Visit: Payer: Self-pay | Admitting: Cardiovascular Disease

## 2017-10-22 NOTE — Telephone Encounter (Signed)
Please review for refill, Thanks !  

## 2017-10-29 NOTE — Progress Notes (Signed)
Patient ID: Caroline Hernandez, female   DOB: May 14, 1946, 72 y.o.   MRN: [id number] Cardiology Office Note  Date:  10/31/2017   ID:  Bettyjo Lundblad, DOB 1945/12/25, MRN [id number]  PCP:  System, Provider Not In   Chief Complaint  Patient presents with  . Other    Follow up for blood pressure issues. Patient c/o blood pressure running high.  Meds reviewed verbally with patient.     HPI:  72 y.o. 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,  atrial fibrillation starting possibly in April 2016,  noted again June 2016,  acute on chronic diastolic CHF initially April 2016,   admitted to the hospital with diastolic CHF/leg swelling,  abdominal bloating, angina,  She has bilateral prosthesis She has ICD who presents For routine follow-up of her atrial fibrillation  Doing well today, presents in a wheelchair Denies any significant tachycardia or palpitations concerning for atrial fibrillation  ablation for atrial fibrillation 07/18/2016 10/09/2016:  Cardioversion  She does have waxing waning leg edema, sometimes into her abdomen Taking Lasix 40 twice daily Sugars high Hemoglobin A1c greater than 10, trying to watch her diet   Blood pressure typically 150 or higher at home  TEE: 40% to 45% Mild to mod TR  EKG personally reviewed by myself on todays visit Shows normal sinus rhythm with rate 75 bpm first-degree AV block poor R wave progression to the anterior precordial leads, nonspecific ST-T wave abnormality anterolateral leads  Other past medical history 07/18/2016: 1.  Electrophysiology study and radiofrequency catheter ablation on 07/18/16 by Dr Thompson Grayer.  This study demonstrated  Successful electrical isolation and anatomical encircling of all four pulmonary veins with radiofrequency current.  Additional ablation along the posterior wall of the left atrium   Atrial fibrillation successfully  cardioverted to sinus rhythm. Prolonged AV delay noted.  Other past medical history reviewed She underwent cardioversion at the end of January 2017 for persistent atrial fibrillation Maintaining normal sinus rhythm early February (amiodarone decreased down to 100 mg at that time), and in March when seen by Dr. Caryl Comes Reports having some shortness of breath, worsening leg swelling over the past month or 2 Weight has increased from low 180 pounds up to 191 pounds at which time she increased Lasix up to 40 mg twice a day Mild improvement in her weight, down 4 pounds Still with significant fatigue, leg swelling, shortness of breath on exertion  EKG on today's visit shows atrial fibrillation with ventricular rate 50 bpm, left anterior fascicular block  History of coronary disease with cardiac arrest in 2004, taken for three-vessel CABG, LIMA to the LAD, vein graft to the RCA, vein graft to an OM. No stenting or intervention since that time.  chronic infections of her legs leading to amputation. In hindsight family wonders if this could've been exacerbated by CLL.   She reports having problems in the past with anemia. Started on iron supplementation with improvement  PMH:   has a past medical history of Amputation of left lower extremity below knee ([place]), Amputation of right lower extremity below knee ([place]), CAD (coronary artery disease), Chronic combined systolic and diastolic CHF (congestive heart failure) ([place]), CKD (chronic kidney disease), stage IV ([place]), CLL (chronic lymphocytic leukemia) ([place]), Diabetes mellitus without complication ([place]), Essential hypertension, GERD (gastroesophageal reflux disease), Hiatal hernia, History of cardiac arrest, Hyperlipidemia, Ischemic cardiomyopathy, PAD (peripheral artery disease) ([place]), and Persistent atrial  fibrillation ([place]).  PSH:    Past Surgical History:  Procedure Laterality Date  . ABDOMINAL HYSTERECTOMY    . Amputation lower extremity bilaterally  Bilateral   . CARDIAC CATHETERIZATION    . CARDIOVERSION N/A 10/09/2016   Procedure: CARDIOVERSION;  Surgeon: Lelon Perla, MD;  Location: [facility] ENDOSCOPY;  Service: Cardiovascular;  Laterality: N/A;  . CORONARY ARTERY BYPASS GRAFT    . ELECTROPHYSIOLOGIC STUDY N/A 10/07/2015   Procedure: CARDIOVERSION;  Surgeon: Minna Merritts, MD;  Location: [facility] ORS;  Service: Cardiovascular;  Laterality: N/A;  . ELECTROPHYSIOLOGIC STUDY N/A 04/25/2016   Procedure: Cardioversion;  Surgeon: Minna Merritts, MD;  Location: [facility] ORS;  Service: Cardiovascular;  Laterality: N/A;  . ELECTROPHYSIOLOGIC STUDY N/A 07/18/2016   Procedure: Atrial Fibrillation Ablation;  Surgeon: Thompson Grayer, MD;  Location: [place] CV LAB;  Service: Cardiovascular;  Laterality: N/A;  . IMPLANTABLE CARDIOVERTER DEFIBRILLATOR IMPLANT  2005   [employer]   . TEE WITHOUT CARDIOVERSION N/A 07/18/2016   Procedure: TRANSESOPHAGEAL ECHOCARDIOGRAM (TEE);  Surgeon: Sueanne Margarita, MD;  Location: [facility] ENDOSCOPY;  Service: Cardiovascular;  Laterality: N/A;  . TEE WITHOUT CARDIOVERSION N/A 10/09/2016   Procedure: TRANSESOPHAGEAL ECHOCARDIOGRAM (TEE);  Surgeon: Lelon Perla, MD;  Location: [facility] ENDOSCOPY;  Service: Cardiovascular;  Laterality: N/A;    Current Outpatient Medications  Medication Sig Dispense Refill  . amitriptyline (ELAVIL) 25 MG tablet Take 25 mg by mouth every evening.  11  . amLODipine (NORVASC) 10 MG tablet Take 1 tablet (10 mg total) by mouth daily. 90 tablet 3  . atorvastatin (LIPITOR) 40 MG tablet Take 40 mg by mouth daily at 6 PM.   11  . carvedilol (COREG) 3.125 MG tablet TAKE 1 TABLET BY MOUTH TWICE DAILY 60 tablet 3  . Cholecalciferol (HM VITAMIN D3) 4000 UNITS CAPS Take 4,000 Units by mouth daily.    . furosemide (LASIX) 40 MG tablet TAKE 1 TABLET(40 MG) BY MOUTH TWICE DAILY 60 tablet 3  . insulin aspart (NOVOLOG) 100 UNIT/ML injection Inject 5-10 Units into the skin See admin instructions. 10 units into the skin  before breakfast then 5 units before lunch then 10 units before supper (evening meal)    . insulin glargine (LANTUS) 100 UNIT/ML injection Inject 14-35 Units into the skin See admin instructions. 35 units into the skin before breakfast and 14 units before supper (evening meal)    . isosorbide mononitrate (IMDUR) 30 MG 24 hr tablet TAKE 1 TABLET(30 MG) BY MOUTH TWICE DAILY 60 tablet 3  . losartan (COZAAR) 100 MG tablet Take 100 mg by mouth every evening.   11  . Multiple Vitamins-Minerals (VITEYES AREDS FORMULA/LUTEIN) CAPS Take 1 capsule by mouth 2 (two) times daily.    . nitroGLYCERIN (NITROSTAT) 0.4 MG SL tablet Place 1 tablet (0.4 mg total) under the tongue every 5 (five) minutes as needed for chest pain. 30 tablet 0  . pantoprazole (PROTONIX) 40 MG tablet Take 40 mg by mouth at bedtime.    Marland Kitchen POLY-IRON 150 150 MG capsule Take 150 mg by mouth daily.  3  . potassium chloride SA (K-DUR,KLOR-CON) 20 MEQ tablet TAKE 1 TABLET BY MOUTH TWICE DAILY 180 tablet 3  . PREVIDENT 5000 DRY MOUTH 1.1 % GEL dental gel daily. Brush teeth with paste for at least 1 minute once a day  5  . sertraline (ZOLOFT) 100 MG tablet Take 100 mg by mouth at bedtime.   11  . vitamin C (ASCORBIC ACID) 500 MG tablet Take 500 mg by  mouth daily.    Marland Kitchen warfarin (COUMADIN) 4 MG tablet TAKE AS DIRECTED BY COUMADIN CLINIC 50 tablet 0   No current facility-administered medications for this visit.      Allergies:   Zosyn [piperacillin sod-tazobactam so]   Social History:  The patient  reports that  has never smoked. she has never used smokeless tobacco. She reports that she does not drink alcohol or use drugs.   Family History:   family history includes Atrial fibrillation in her mother; CAD in her mother; Diabetes in her father and mother; Lung cancer in her father.    Review of Systems: Review of Systems  Constitutional: Negative.   Respiratory: Negative.   Cardiovascular: Positive for leg swelling.  Gastrointestinal:  Negative.   Musculoskeletal: Negative.   Neurological: Negative.   Psychiatric/Behavioral: Negative.   All other systems reviewed and are negative.    PHYSICAL EXAM: VS:  BP (!) 156/72 (BP Location: Right Arm, Patient Position: Sitting, Cuff Size: Normal)   Pulse 75   Ht 5\' 8"  (1.727 m)   Wt 202 lb (91.6 kg) Comment: Patient would not weight  BMI 30.71 kg/m  , BMI Body mass index is 30.71 kg/m. Constitutional:  oriented to person, place, and time. No distress. Presents in a wheelchair HENT:  Head: Normocephalic and atraumatic.  Eyes:  no discharge. No scleral icterus.  Neck: Normal range of motion. Neck supple. No JVD present.  Cardiovascular: Normal rate, regular rhythm, normal heart sounds and intact distal pulses. Exam reveals no gallop and no friction rub. No edema No murmur heard. Pulmonary/Chest: Effort normal and breath sounds normal. No stridor. No respiratory distress.  no wheezes.  no rales.  no tenderness.  Abdominal: Soft.  no distension.  no tenderness.  Musculoskeletal: Normal range of motion.  no  tenderness or deformity. Bilateral amputation above the knee Neurological:  normal muscle tone. Coordination normal. No atrophy Skin: Skin is warm and dry. No rash noted. not diaphoretic.  Psychiatric:  normal mood and affect. behavior is normal. Thought content normal.      Recent Labs: No results found for requested labs within last 8760 hours.    Lipid Panel Lab Results  Component Value Date   CHOL 91 06/13/2015   HDL 25 (L) 06/13/2015   LDLCALC 40 06/13/2015   TRIG 128 06/13/2015      Wt Readings from Last 3 Encounters:  10/31/17 202 lb (91.6 kg)  05/02/17 211 lb (95.7 kg)  02/16/17 210 lb (95.3 kg)       ASSESSMENT AND PLAN:   SOB (shortness of breath) -  Breathing stable on Lasix 40 twice daily Recommended she take extra Lasix as needed for worsening leg swelling or abdominal bloating  Persistent atrial fibrillation ([place]) -  History of  ablation, cardioversion January 2018,   remained in normal sinus rhythm.  Stable  Leg swelling Mild leg swelling on Lasix 40 twice a day Recommended extra Lasix 60 twice daily only as needed for worsening leg swelling abdominal bloating  Coronary artery disease involving native coronary artery of native heart with angina pectoris with documented spasm (HCC) Currently with no symptoms of angina. No further workup at this time. Continue current medication regimen.  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 EKG 12-Lead     Signed, Esmond Plants, M.D., Ph.D. 10/31/2017  [place], [place]

## 2017-10-31 ENCOUNTER — Ambulatory Visit (INDEPENDENT_AMBULATORY_CARE_PROVIDER_SITE_OTHER): Payer: Medicare HMO

## 2017-10-31 ENCOUNTER — Encounter: Payer: Self-pay | Admitting: Cardiovascular Disease

## 2017-10-31 ENCOUNTER — Ambulatory Visit: Payer: Medicare HMO | Admitting: Cardiovascular Disease

## 2017-10-31 VITALS — BP 156/72 | HR 75 | Ht 68.0 in | Wt 202.0 lb

## 2017-10-31 DIAGNOSIS — I259 Chronic ischemic heart disease, unspecified: Secondary | ICD-10-CM

## 2017-10-31 DIAGNOSIS — I10 Essential (primary) hypertension: Secondary | ICD-10-CM

## 2017-10-31 DIAGNOSIS — I5043 Acute on chronic combined systolic (congestive) and diastolic (congestive) heart failure: Secondary | ICD-10-CM | POA: Diagnosis not present

## 2017-10-31 DIAGNOSIS — Z95810 Presence of automatic (implantable) cardiac defibrillator: Secondary | ICD-10-CM | POA: Diagnosis not present

## 2017-10-31 DIAGNOSIS — N184 Chronic kidney disease, stage 4 (severe): Secondary | ICD-10-CM

## 2017-10-31 DIAGNOSIS — Z7901 Long term (current) use of anticoagulants: Secondary | ICD-10-CM

## 2017-10-31 DIAGNOSIS — I481 Persistent atrial fibrillation: Secondary | ICD-10-CM

## 2017-10-31 DIAGNOSIS — R0602 Shortness of breath: Secondary | ICD-10-CM

## 2017-10-31 DIAGNOSIS — I25118 Atherosclerotic heart disease of native coronary artery with other forms of angina pectoris: Secondary | ICD-10-CM | POA: Diagnosis not present

## 2017-10-31 DIAGNOSIS — I4819 Other persistent atrial fibrillation: Secondary | ICD-10-CM

## 2017-10-31 DIAGNOSIS — Z5181 Encounter for therapeutic drug level monitoring: Secondary | ICD-10-CM

## 2017-10-31 LAB — POCT INR: INR: 1.7

## 2017-10-31 MED ORDER — ISOSORBIDE MONONITRATE ER 30 MG PO TB24: 60.0000 mg | ORAL_TABLET | Freq: Two times a day (BID) | ORAL | 6 refills | Status: DC

## 2017-10-31 NOTE — Patient Instructions (Signed)
Please take 2 tablets today and tomorrow, then continue same dosage 6mg  daily except 4mg  on Sundays, Tuesdays, and Thursdays.   Recheck in 2 weeks.

## 2017-10-31 NOTE — Patient Instructions (Signed)
Medication Instructions:   Consider increasing the isosorbide up to two in the AM and one in the PM for blood pressure  Extra lasix 60 mg twice a day as needed for leg swelling Otherwise stay on 40 twice a day   Labwork:  No new labs needed  Testing/Procedures:  No further testing at this time   Follow-Up: It was a pleasure seeing you in the office today. Please call us if you have new issues that need to be addressed before your next appt.  [phone]  Your physician wants you to follow-up in: 12 months.  You will receive a reminder letter in the mail two months in advance. If you don't receive a letter, please call our office to schedule the follow-up appointment.  If you need a refill on your cardiac medications before your next appointment, please call your pharmacy.  For educational health videos Log in to : [email or web address] Or : [email or web address], password : [email or web address]

## 2017-11-14 ENCOUNTER — Other Ambulatory Visit: Payer: Self-pay | Admitting: Cardiovascular Disease

## 2017-11-14 NOTE — Telephone Encounter (Signed)
Please review for refill, Thanks !  

## 2017-11-19 ENCOUNTER — Ambulatory Visit (INDEPENDENT_AMBULATORY_CARE_PROVIDER_SITE_OTHER): Payer: Medicare HMO

## 2017-11-19 DIAGNOSIS — Z7901 Long term (current) use of anticoagulants: Secondary | ICD-10-CM

## 2017-11-19 DIAGNOSIS — Z5181 Encounter for therapeutic drug level monitoring: Secondary | ICD-10-CM | POA: Diagnosis not present

## 2017-11-19 DIAGNOSIS — I4819 Other persistent atrial fibrillation: Secondary | ICD-10-CM

## 2017-11-19 DIAGNOSIS — I481 Persistent atrial fibrillation: Secondary | ICD-10-CM

## 2017-11-19 LAB — POCT INR: INR: 2.5

## 2017-11-19 NOTE — Patient Instructions (Signed)
Please continue same dosage 6mg  daily except 4mg  on Sundays, Tuesdays, and Thursdays.   Recheck in 3 weeks.

## 2017-12-10 ENCOUNTER — Ambulatory Visit (INDEPENDENT_AMBULATORY_CARE_PROVIDER_SITE_OTHER): Payer: Medicare HMO

## 2017-12-10 DIAGNOSIS — Z7901 Long term (current) use of anticoagulants: Secondary | ICD-10-CM | POA: Diagnosis not present

## 2017-12-10 DIAGNOSIS — I4819 Other persistent atrial fibrillation: Secondary | ICD-10-CM

## 2017-12-10 DIAGNOSIS — I481 Persistent atrial fibrillation: Secondary | ICD-10-CM

## 2017-12-10 DIAGNOSIS — Z5181 Encounter for therapeutic drug level monitoring: Secondary | ICD-10-CM | POA: Diagnosis not present

## 2017-12-10 LAB — POCT INR: INR: 2.3

## 2017-12-10 NOTE — Patient Instructions (Signed)
Please continue same dosage 6mg  daily except 4mg  on Sundays, Tuesdays, and Thursdays.   Recheck in 4 weeks.

## 2018-01-07 ENCOUNTER — Ambulatory Visit (INDEPENDENT_AMBULATORY_CARE_PROVIDER_SITE_OTHER): Payer: Medicare HMO

## 2018-01-07 DIAGNOSIS — I481 Persistent atrial fibrillation: Secondary | ICD-10-CM

## 2018-01-07 DIAGNOSIS — I4819 Other persistent atrial fibrillation: Secondary | ICD-10-CM

## 2018-01-07 DIAGNOSIS — Z7901 Long term (current) use of anticoagulants: Secondary | ICD-10-CM

## 2018-01-07 DIAGNOSIS — Z5181 Encounter for therapeutic drug level monitoring: Secondary | ICD-10-CM | POA: Diagnosis not present

## 2018-01-07 LAB — POCT INR: INR: 1.9

## 2018-01-07 NOTE — Patient Instructions (Signed)
Please take 1.5 tablets tomorrow, then continue same dosage 6mg  daily except 4mg  on Sundays, Tuesdays, and Thursdays.   Recheck in 5 weeks.

## 2018-01-20 ENCOUNTER — Other Ambulatory Visit: Payer: Self-pay | Admitting: Cardiovascular Disease

## 2018-02-03 ENCOUNTER — Other Ambulatory Visit: Payer: Self-pay | Admitting: Cardiovascular Disease

## 2018-02-11 ENCOUNTER — Ambulatory Visit (INDEPENDENT_AMBULATORY_CARE_PROVIDER_SITE_OTHER): Payer: Medicare HMO

## 2018-02-11 DIAGNOSIS — I4819 Other persistent atrial fibrillation: Secondary | ICD-10-CM

## 2018-02-11 DIAGNOSIS — Z5181 Encounter for therapeutic drug level monitoring: Secondary | ICD-10-CM | POA: Diagnosis not present

## 2018-02-11 DIAGNOSIS — Z7901 Long term (current) use of anticoagulants: Secondary | ICD-10-CM

## 2018-02-11 DIAGNOSIS — I481 Persistent atrial fibrillation: Secondary | ICD-10-CM

## 2018-02-11 LAB — POCT INR: INR: 1.8 — AB (ref 2.0–3.0)

## 2018-02-11 NOTE — Patient Instructions (Signed)
Please take 2 tablets today, then START NEW dosage 6mg  daily except 4mg  on Sundays and Thursdays.   Recheck in 3 weeks.

## 2018-02-12 ENCOUNTER — Other Ambulatory Visit: Payer: Self-pay | Admitting: Cardiovascular Disease

## 2018-02-12 NOTE — Telephone Encounter (Signed)
Please review for refill. Thanks!  

## 2018-03-04 ENCOUNTER — Ambulatory Visit (INDEPENDENT_AMBULATORY_CARE_PROVIDER_SITE_OTHER): Payer: Medicare HMO

## 2018-03-04 DIAGNOSIS — I481 Persistent atrial fibrillation: Secondary | ICD-10-CM

## 2018-03-04 DIAGNOSIS — Z5181 Encounter for therapeutic drug level monitoring: Secondary | ICD-10-CM

## 2018-03-04 DIAGNOSIS — Z7901 Long term (current) use of anticoagulants: Secondary | ICD-10-CM

## 2018-03-04 DIAGNOSIS — I4819 Other persistent atrial fibrillation: Secondary | ICD-10-CM

## 2018-03-04 LAB — POCT INR: INR: 2.4 (ref 2.0–3.0)

## 2018-03-04 NOTE — Patient Instructions (Signed)
Please continue dosage 6 mg daily except 4mg  on Sundays and Thursdays.   Recheck in 4 weeks.

## 2018-03-07 ENCOUNTER — Inpatient Hospital Stay
Admission: EM | Admit: 2018-03-07 | Discharge: 2018-03-10 | DRG: 871 | Disposition: A | Discharge status: Home Health | Payer: Medicare HMO | Attending: Internal Medicine | Admitting: Internal Medicine

## 2018-03-07 ENCOUNTER — Other Ambulatory Visit: Payer: Self-pay

## 2018-03-07 ENCOUNTER — Emergency Department: Payer: Medicare HMO

## 2018-03-07 ENCOUNTER — Encounter: Payer: Self-pay | Admitting: Emergency Medicine

## 2018-03-07 DIAGNOSIS — Z794 Long term (current) use of insulin: Secondary | ICD-10-CM | POA: Diagnosis not present

## 2018-03-07 DIAGNOSIS — Z89511 Acquired absence of right leg below knee: Secondary | ICD-10-CM

## 2018-03-07 DIAGNOSIS — Z7901 Long term (current) use of anticoagulants: Secondary | ICD-10-CM | POA: Diagnosis not present

## 2018-03-07 DIAGNOSIS — I255 Ischemic cardiomyopathy: Secondary | ICD-10-CM | POA: Diagnosis present

## 2018-03-07 DIAGNOSIS — N184 Chronic kidney disease, stage 4 (severe): Secondary | ICD-10-CM | POA: Diagnosis present

## 2018-03-07 DIAGNOSIS — Z90710 Acquired absence of both cervix and uterus: Secondary | ICD-10-CM | POA: Diagnosis not present

## 2018-03-07 DIAGNOSIS — Z881 Allergy status to other antibiotic agents status: Secondary | ICD-10-CM

## 2018-03-07 DIAGNOSIS — K449 Diaphragmatic hernia without obstruction or gangrene: Secondary | ICD-10-CM | POA: Diagnosis present

## 2018-03-07 DIAGNOSIS — C9110 Chronic lymphocytic leukemia of B-cell type not having achieved remission: Secondary | ICD-10-CM | POA: Diagnosis present

## 2018-03-07 DIAGNOSIS — N189 Chronic kidney disease, unspecified: Secondary | ICD-10-CM

## 2018-03-07 DIAGNOSIS — Z833 Family history of diabetes mellitus: Secondary | ICD-10-CM

## 2018-03-07 DIAGNOSIS — N179 Acute kidney failure, unspecified: Secondary | ICD-10-CM | POA: Diagnosis present

## 2018-03-07 DIAGNOSIS — K219 Gastro-esophageal reflux disease without esophagitis: Secondary | ICD-10-CM | POA: Diagnosis present

## 2018-03-07 DIAGNOSIS — N390 Urinary tract infection, site not specified: Secondary | ICD-10-CM | POA: Diagnosis present

## 2018-03-07 DIAGNOSIS — I2510 Atherosclerotic heart disease of native coronary artery without angina pectoris: Secondary | ICD-10-CM | POA: Diagnosis present

## 2018-03-07 DIAGNOSIS — I481 Persistent atrial fibrillation: Secondary | ICD-10-CM | POA: Diagnosis present

## 2018-03-07 DIAGNOSIS — I5043 Acute on chronic combined systolic (congestive) and diastolic (congestive) heart failure: Secondary | ICD-10-CM | POA: Diagnosis present

## 2018-03-07 DIAGNOSIS — Z66 Do not resuscitate: Secondary | ICD-10-CM | POA: Diagnosis present

## 2018-03-07 DIAGNOSIS — Z8674 Personal history of sudden cardiac arrest: Secondary | ICD-10-CM | POA: Diagnosis not present

## 2018-03-07 DIAGNOSIS — Z801 Family history of malignant neoplasm of trachea, bronchus and lung: Secondary | ICD-10-CM

## 2018-03-07 DIAGNOSIS — R0602 Shortness of breath: Secondary | ICD-10-CM | POA: Diagnosis not present

## 2018-03-07 DIAGNOSIS — E1122 Type 2 diabetes mellitus with diabetic chronic kidney disease: Secondary | ICD-10-CM | POA: Diagnosis present

## 2018-03-07 DIAGNOSIS — T24211A Burn of second degree of right thigh, initial encounter: Secondary | ICD-10-CM | POA: Diagnosis present

## 2018-03-07 DIAGNOSIS — E785 Hyperlipidemia, unspecified: Secondary | ICD-10-CM | POA: Diagnosis present

## 2018-03-07 DIAGNOSIS — A419 Sepsis, unspecified organism: Principal | ICD-10-CM | POA: Diagnosis present

## 2018-03-07 DIAGNOSIS — X118XXA Contact with other hot tap-water, initial encounter: Secondary | ICD-10-CM | POA: Diagnosis present

## 2018-03-07 DIAGNOSIS — E1151 Type 2 diabetes mellitus with diabetic peripheral angiopathy without gangrene: Secondary | ICD-10-CM | POA: Diagnosis present

## 2018-03-07 DIAGNOSIS — Z8249 Family history of ischemic heart disease and other diseases of the circulatory system: Secondary | ICD-10-CM

## 2018-03-07 DIAGNOSIS — I509 Heart failure, unspecified: Secondary | ICD-10-CM

## 2018-03-07 DIAGNOSIS — Z89512 Acquired absence of left leg below knee: Secondary | ICD-10-CM

## 2018-03-07 DIAGNOSIS — I480 Paroxysmal atrial fibrillation: Secondary | ICD-10-CM | POA: Diagnosis present

## 2018-03-07 DIAGNOSIS — B9620 Unspecified Escherichia coli [E. coli] as the cause of diseases classified elsewhere: Secondary | ICD-10-CM | POA: Diagnosis present

## 2018-03-07 LAB — GLUCOSE, CAPILLARY
Glucose-Capillary: 142 mg/dL — ABNORMAL HIGH (ref 70–99)
Glucose-Capillary: 197 mg/dL — ABNORMAL HIGH (ref 70–99)
Glucose-Capillary: 158 mg/dL — ABNORMAL HIGH (ref 70–99)

## 2018-03-07 LAB — URINALYSIS, ROUTINE W REFLEX MICROSCOPIC
Bilirubin Urine: NEGATIVE
GLUCOSE, UA: NEGATIVE mg/dL
HGB URINE DIPSTICK: NEGATIVE
Ketones, ur: NEGATIVE mg/dL
NITRITE: NEGATIVE
Protein, ur: 100 mg/dL — AB
Specific Gravity, Urine: 1.014 (ref 1.005–1.030)
pH: 6 (ref 5.0–8.0)

## 2018-03-07 LAB — COMPREHENSIVE METABOLIC PANEL
ALBUMIN: 3.7 g/dL (ref 3.5–5.0)
ALK PHOS: 78 U/L (ref 38–126)
ALT: 14 U/L (ref 0–44)
AST: 20 U/L (ref 15–41)
Anion gap: 10 (ref 5–15)
BILIRUBIN TOTAL: 1.1 mg/dL (ref 0.3–1.2)
BUN: 30 mg/dL — AB (ref 8–23)
CALCIUM: 8.6 mg/dL — AB (ref 8.9–10.3)
CO2: 22 mmol/L (ref 22–32)
Chloride: 103 mmol/L (ref 98–111)
Creatinine, Ser: 2.5 mg/dL — ABNORMAL HIGH (ref 0.44–1.00)
GFR calc Af Amer: 21 mL/min — ABNORMAL LOW (ref >60)
GFR, EST NON AFRICAN AMERICAN: 18 mL/min — AB (ref >60)
GLUCOSE: 178 mg/dL — AB (ref 70–99)
Potassium: 4.3 mmol/L (ref 3.5–5.1)
Sodium: 135 mmol/L (ref 135–145)
TOTAL PROTEIN: 7.4 g/dL (ref 6.5–8.1)

## 2018-03-07 LAB — CBC WITH DIFFERENTIAL/PLATELET
BASOS ABS: 0 10*3/uL (ref 0–0.1)
Basophils Relative: 0 %
EOS ABS: 0 10*3/uL (ref 0–0.7)
Eosinophils Relative: 0 %
HEMATOCRIT: 36.7 % (ref 35.0–47.0)
HEMOGLOBIN: 11.8 g/dL — AB (ref 12.0–16.0)
LYMPHS PCT: 71 %
Lymphs Abs: 28 10*3/uL — ABNORMAL HIGH (ref 1.0–3.6)
MCH: 27.4 pg (ref 26.0–34.0)
MCHC: 32.2 g/dL (ref 32.0–36.0)
MCV: 85.2 fL (ref 80.0–100.0)
MONOS PCT: 3 %
Monocytes Absolute: 1.2 10*3/uL — ABNORMAL HIGH (ref 0.2–0.9)
NEUTROS ABS: 10.2 10*3/uL — AB (ref 1.4–6.5)
Neutrophils Relative %: 26 %
Platelets: 195 10*3/uL (ref 150–440)
RBC: 4.31 MIL/uL (ref 3.80–5.20)
RDW: 15.4 % — AB (ref 11.5–14.5)
WBC: 39.4 10*3/uL — ABNORMAL HIGH (ref 3.6–11.0)

## 2018-03-07 LAB — PROTIME-INR
INR: 2.33
Prothrombin Time: 25.4 seconds — ABNORMAL HIGH (ref 11.4–15.2)

## 2018-03-07 LAB — LACTIC ACID, PLASMA
Lactic Acid, Venous: 1.2 mmol/L (ref 0.5–1.9)
Lactic Acid, Venous: 1.1 mmol/L (ref 0.5–1.9)

## 2018-03-07 LAB — MRSA PCR SCREENING: MRSA by PCR: NEGATIVE

## 2018-03-07 MED ORDER — INSULIN ASPART 100 UNIT/ML ~~LOC~~ SOLN
8.0000 [IU] | Freq: Two times a day (BID) | SUBCUTANEOUS | Status: DC
  Administered 2018-03-07 – 2018-03-10 (×6): 8 [IU] via SUBCUTANEOUS
  Filled 2018-03-07 (×5): qty 1

## 2018-03-07 MED ORDER — AMLODIPINE BESYLATE 10 MG PO TABS
10.0000 mg | ORAL_TABLET | Freq: Every day | ORAL | Status: DC
  Administered 2018-03-07 – 2018-03-10 (×4): 10 mg via ORAL
  Filled 2018-03-07 (×4): qty 1

## 2018-03-07 MED ORDER — ENOXAPARIN SODIUM 40 MG/0.4ML ~~LOC~~ SOLN: 30.0000 mg | SUBCUTANEOUS | Status: DC

## 2018-03-07 MED ORDER — WARFARIN - PHARMACIST DOSING INPATIENT
Freq: Every day | Status: DC
  Administered 2018-03-07 – 2018-03-09 (×2)

## 2018-03-07 MED ORDER — SODIUM CHLORIDE 0.9 % IV SOLN
1.0000 g | Freq: Once | INTRAVENOUS | Status: AC
  Administered 2018-03-07: 1 g via INTRAVENOUS
  Filled 2018-03-07: qty 10

## 2018-03-07 MED ORDER — POTASSIUM CHLORIDE CRYS ER 20 MEQ PO TBCR
20.0000 meq | EXTENDED_RELEASE_TABLET | Freq: Two times a day (BID) | ORAL | Status: DC
  Administered 2018-03-07 – 2018-03-10 (×7): 20 meq via ORAL
  Filled 2018-03-07 (×7): qty 1

## 2018-03-07 MED ORDER — AMITRIPTYLINE HCL 25 MG PO TABS
25.0000 mg | ORAL_TABLET | Freq: Every day | ORAL | Status: DC
  Administered 2018-03-07 – 2018-03-09 (×3): 25 mg via ORAL
  Filled 2018-03-07 (×3): qty 1

## 2018-03-07 MED ORDER — FUROSEMIDE 40 MG PO TABS
40.0000 mg | ORAL_TABLET | Freq: Two times a day (BID) | ORAL | Status: DC
  Administered 2018-03-08 – 2018-03-10 (×5): 40 mg via ORAL
  Filled 2018-03-07 (×5): qty 1

## 2018-03-07 MED ORDER — CEFTRIAXONE SODIUM 1 G IJ SOLR
1.0000 g | INTRAMUSCULAR | Status: DC
  Administered 2018-03-08 – 2018-03-09 (×2): 1 g via INTRAVENOUS
  Filled 2018-03-07: qty 1
  Filled 2018-03-07: qty 10
  Filled 2018-03-07: qty 1

## 2018-03-07 MED ORDER — WARFARIN SODIUM 6 MG PO TABS
6.0000 mg | ORAL_TABLET | Freq: Every day | ORAL | Status: DC
  Administered 2018-03-07 – 2018-03-09 (×3): 6 mg via ORAL
  Filled 2018-03-07 (×3): qty 1

## 2018-03-07 MED ORDER — INSULIN GLARGINE 100 UNIT/ML ~~LOC~~ SOLN
30.0000 [IU] | Freq: Every day | SUBCUTANEOUS | Status: DC
  Administered 2018-03-08 – 2018-03-10 (×3): 30 [IU] via SUBCUTANEOUS
  Filled 2018-03-07 (×3): qty 0.3

## 2018-03-07 MED ORDER — GABAPENTIN 100 MG PO CAPS
100.0000 mg | ORAL_CAPSULE | Freq: Two times a day (BID) | ORAL | Status: DC
  Administered 2018-03-07 – 2018-03-10 (×7): 100 mg via ORAL
  Filled 2018-03-07 (×7): qty 1

## 2018-03-07 MED ORDER — CARVEDILOL 3.125 MG PO TABS
3.1250 mg | ORAL_TABLET | Freq: Two times a day (BID) | ORAL | Status: DC
  Administered 2018-03-07 – 2018-03-10 (×7): 3.125 mg via ORAL
  Filled 2018-03-07 (×7): qty 1

## 2018-03-07 MED ORDER — SODIUM FLUORIDE 1.1 % DT GEL: 1.0000 "application " | Freq: Every day | DENTAL | Status: DC

## 2018-03-07 MED ORDER — INSULIN ASPART 100 UNIT/ML ~~LOC~~ SOLN
5.0000 [IU] | Freq: Three times a day (TID) | SUBCUTANEOUS | Status: DC
Start: 2018-03-07 — End: 2018-03-07

## 2018-03-07 MED ORDER — INSULIN ASPART 100 UNIT/ML ~~LOC~~ SOLN
4.0000 [IU] | Freq: Every day | SUBCUTANEOUS | Status: DC
  Administered 2018-03-08 – 2018-03-10 (×3): 4 [IU] via SUBCUTANEOUS
  Filled 2018-03-07 (×3): qty 1

## 2018-03-07 MED ORDER — INSULIN GLARGINE 100 UNIT/ML ~~LOC~~ SOLN: 14.0000 [IU] | Freq: Two times a day (BID) | SUBCUTANEOUS | Status: DC

## 2018-03-07 MED ORDER — PANTOPRAZOLE SODIUM 40 MG PO TBEC
40.0000 mg | DELAYED_RELEASE_TABLET | Freq: Every day | ORAL | Status: DC
  Administered 2018-03-07 – 2018-03-09 (×3): 40 mg via ORAL
  Filled 2018-03-07 (×3): qty 1

## 2018-03-07 MED ORDER — SODIUM CHLORIDE 0.9 % IV SOLN
1000.0000 mL | Freq: Once | INTRAVENOUS | Status: AC
  Administered 2018-03-07: 1000 mL via INTRAVENOUS

## 2018-03-07 MED ORDER — INSULIN ASPART 100 UNIT/ML ~~LOC~~ SOLN: 0.0000 [IU] | Freq: Every day | SUBCUTANEOUS | Status: DC

## 2018-03-07 MED ORDER — ALBUTEROL SULFATE (2.5 MG/3ML) 0.083% IN NEBU: 2.5000 mg | INHALATION_SOLUTION | RESPIRATORY_TRACT | Status: DC | PRN

## 2018-03-07 MED ORDER — ACETAMINOPHEN 325 MG PO TABS
650.0000 mg | ORAL_TABLET | Freq: Four times a day (QID) | ORAL | Status: DC | PRN
  Administered 2018-03-07 – 2018-03-09 (×6): 650 mg via ORAL
  Filled 2018-03-07 (×6): qty 2

## 2018-03-07 MED ORDER — NITROGLYCERIN 0.4 MG SL SUBL: 0.4000 mg | SUBLINGUAL_TABLET | SUBLINGUAL | Status: DC | PRN

## 2018-03-07 MED ORDER — ONDANSETRON HCL 4 MG PO TABS: 4.0000 mg | ORAL_TABLET | Freq: Four times a day (QID) | ORAL | Status: DC | PRN

## 2018-03-07 MED ORDER — ACETAMINOPHEN 650 MG RE SUPP: 650.0000 mg | Freq: Four times a day (QID) | RECTAL | Status: DC | PRN

## 2018-03-07 MED ORDER — ATORVASTATIN CALCIUM 20 MG PO TABS
40.0000 mg | ORAL_TABLET | Freq: Every day | ORAL | Status: DC
  Administered 2018-03-07 – 2018-03-09 (×3): 40 mg via ORAL
  Filled 2018-03-07 (×3): qty 2

## 2018-03-07 MED ORDER — ISOSORBIDE MONONITRATE ER 30 MG PO TB24
60.0000 mg | ORAL_TABLET | Freq: Two times a day (BID) | ORAL | Status: DC
  Administered 2018-03-07 – 2018-03-10 (×7): 60 mg via ORAL
  Filled 2018-03-07 (×7): qty 2

## 2018-03-07 MED ORDER — INSULIN ASPART 100 UNIT/ML ~~LOC~~ SOLN
0.0000 [IU] | Freq: Three times a day (TID) | SUBCUTANEOUS | Status: DC
  Administered 2018-03-07: 2 [IU] via SUBCUTANEOUS
  Administered 2018-03-07: 3 [IU] via SUBCUTANEOUS
  Administered 2018-03-08 (×3): 2 [IU] via SUBCUTANEOUS
  Administered 2018-03-09 (×3): 3 [IU] via SUBCUTANEOUS
  Administered 2018-03-10: 12:00:00 8 [IU] via SUBCUTANEOUS
  Administered 2018-03-10: 5 [IU] via SUBCUTANEOUS
  Filled 2018-03-07 (×9): qty 1

## 2018-03-07 MED ORDER — INSULIN GLARGINE 100 UNIT/ML ~~LOC~~ SOLN
10.0000 [IU] | Freq: Every day | SUBCUTANEOUS | Status: DC
  Administered 2018-03-07 – 2018-03-09 (×3): 10 [IU] via SUBCUTANEOUS
  Filled 2018-03-07 (×4): qty 0.1

## 2018-03-07 MED ORDER — SERTRALINE HCL 50 MG PO TABS
100.0000 mg | ORAL_TABLET | Freq: Every day | ORAL | Status: DC
  Administered 2018-03-07 – 2018-03-09 (×3): 100 mg via ORAL
  Filled 2018-03-07 (×3): qty 2

## 2018-03-07 MED ORDER — POLYETHYLENE GLYCOL 3350 17 G PO PACK: 17.0000 g | PACK | Freq: Every day | ORAL | Status: DC | PRN

## 2018-03-07 MED ORDER — ONDANSETRON HCL 4 MG/2ML IJ SOLN
4.0000 mg | Freq: Four times a day (QID) | INTRAMUSCULAR | Status: DC | PRN
  Administered 2018-03-09: 12:00:00 4 mg via INTRAVENOUS
  Filled 2018-03-07: qty 2

## 2018-03-07 NOTE — H&P (Signed)
[facility] [place] NAME: Dorissa Stinnette    MR#:  [id number]  DATE OF BIRTH:  11-11-1945  DATE OF ADMISSION:  03/07/2018  PRIMARY CARE PHYSICIAN: System, Provider Not In   REQUESTING/REFERRING PHYSICIAN: Dr. Corky Downs  CHIEF COMPLAINT:   Chief Complaint  Patient presents with  . Weakness    HISTORY OF PRESENT ILLNESS:  Yuliet Needs  is a 72 y.o. female with a known history of CLL, CKD stage III, diabetes, hypertension, paroxysmal atrial fibrillation, systolic congestive heart failure, bilateral below-knee amputation with prosthesis presents to the emergency room with 1 week of weakness.  She has also had some soft stools over the last week 1-2 episodes a day.  No abdominal pain.  Had nausea and one episode of vomiting today.  Afebrile.  She does have CLL and has chronic leukocytosis. Here in the emergency room patient has been found to have UTI, sepsis, febrile and is being admitted to the hospitalist service.  PAST MEDICAL HISTORY:   Past Medical History:  Diagnosis Date  . Amputation of left lower extremity below knee ([place])   . Amputation of right lower extremity below knee ([place])   . CAD (coronary artery disease)    a. 2004 Cardiac Arrest/CABG x 3 (LIMA->LAD, VG->OM, VG->RCA);  b. 06/2015 lexiscan MV: no significant ischemia, EF 48%, low risk->Med Rx.  . Chronic combined systolic and diastolic CHF (congestive heart failure) ([place])    a. 12/2014 Echo: EF 45-50%;  b. 08/2015 Echo: EF 45-50%, ant, antsept HK, mildly dil LA, nl RV, mild-mod TR, sev PAH (59mmHg).  . CKD (chronic kidney disease), stage IV ([place])   . CLL (chronic lymphocytic leukemia) ([place])   . Diabetes mellitus without complication ([place])   . Essential hypertension   . GERD (gastroesophageal reflux disease)   . Hiatal hernia   . History of cardiac arrest    a. 2004.  Marland Kitchen Hyperlipidemia   . Ischemic cardiomyopathy    a. s/p MDT ICD (originally had 4097 lead-->gen change and lead  revision ~ 2012 @ [place]); b. 12/2014 Echo: EF 45-50%;  c.08/2015 Echo: EF 45-50%, ant, antsept HK. Nl RV.  Marland Kitchen PAD (peripheral artery disease) ([facility])    a. s/p BKA  . Persistent atrial fibrillation ([place])    a. Dx 12/2014.  CHA2DS2VASc = 6--> warfarin;  b. 09/2015 s/p DCCV-->on amio; c. s/p DCCV 04/25/16    PAST SURGICAL HISTORY:   Past Surgical History:  Procedure Laterality Date  . ABDOMINAL HYSTERECTOMY    . Amputation lower extremity bilaterally Bilateral   . CARDIAC CATHETERIZATION    . CARDIOVERSION N/A 10/09/2016   Procedure: CARDIOVERSION;  Surgeon: Lelon Perla, MD;  Location: [facility] ENDOSCOPY;  Service: Cardiovascular;  Laterality: N/A;  . CORONARY ARTERY BYPASS GRAFT    . ELECTROPHYSIOLOGIC STUDY N/A 10/07/2015   Procedure: CARDIOVERSION;  Surgeon: Minna Merritts, MD;  Location: [facility] ORS;  Service: Cardiovascular;  Laterality: N/A;  . ELECTROPHYSIOLOGIC STUDY N/A 04/25/2016   Procedure: Cardioversion;  Surgeon: Minna Merritts, MD;  Location: [facility] ORS;  Service: Cardiovascular;  Laterality: N/A;  . ELECTROPHYSIOLOGIC STUDY N/A 07/18/2016   Procedure: Atrial Fibrillation Ablation;  Surgeon: Thompson Grayer, MD;  Location: [facility];  Service: Cardiovascular;  Laterality: N/A;  . IMPLANTABLE CARDIOVERTER DEFIBRILLATOR IMPLANT  2005   [employer]   . TEE WITHOUT CARDIOVERSION N/A 07/18/2016   Procedure: TRANSESOPHAGEAL ECHOCARDIOGRAM (TEE);  Surgeon: Sueanne Margarita, MD;  Location: [place];  Service: Cardiovascular;  Laterality: N/A;  . TEE WITHOUT CARDIOVERSION N/A 10/09/2016   Procedure: TRANSESOPHAGEAL ECHOCARDIOGRAM (TEE);  Surgeon: Lelon Perla, MD;  Location: [facility] ENDOSCOPY;  Service: Cardiovascular;  Laterality: N/A;    SOCIAL HISTORY:   Social History   Tobacco Use  . Smoking status: Never Smoker  . Smokeless tobacco: Never Used  Substance Use Topics  . Alcohol use: No    FAMILY HISTORY:   Family History  Problem Relation Age of Onset  . CAD Mother   .  Diabetes Mother   . Atrial fibrillation Mother   . Lung cancer Father   . Diabetes Father     DRUG ALLERGIES:   Allergies  Allergen Reactions  . Zosyn [Piperacillin Sod-Tazobactam So] Other (See Comments)    Renal failure    REVIEW OF SYSTEMS:   Review of Systems  Constitutional: Positive for malaise/fatigue. Negative for chills, fever and weight loss.  HENT: Negative for hearing loss and nosebleeds.   Eyes: Negative for blurred vision, double vision and pain.  Respiratory: Positive for cough. Negative for hemoptysis, sputum production, shortness of breath and wheezing.   Cardiovascular: Negative for chest pain, palpitations, orthopnea and leg swelling.  Gastrointestinal: Positive for diarrhea, nausea and vomiting. Negative for abdominal pain and constipation.  Genitourinary: Negative for dysuria and hematuria.  Musculoskeletal: Negative for back pain, falls and myalgias.  Skin: Negative for rash.  Neurological: Negative for dizziness, tremors, sensory change, speech change, focal weakness, seizures and headaches.  Endo/Heme/Allergies: Does not bruise/bleed easily.  Psychiatric/Behavioral: Negative for depression and memory loss. The patient is not nervous/anxious.     MEDICATIONS AT HOME:   Prior to Admission medications   Medication Sig Start Date End Date Taking? Authorizing Provider  amitriptyline (ELAVIL) 25 MG tablet Take 25 mg by mouth at bedtime.    Yes [provider]  amLODipine (NORVASC) 10 MG tablet Take 1 tablet (10 mg total) by mouth daily. 08/24/17  Yes Minna Merritts, MD  atorvastatin (LIPITOR) 40 MG tablet Take 40 mg by mouth at bedtime.    Yes [provider]  carvedilol (COREG) 3.125 MG tablet TAKE 1 TABLET BY MOUTH TWICE DAILY 01/21/18  Yes Gollan, Kathlene November, MD  Cholecalciferol (HM VITAMIN D3) 4000 UNITS CAPS Take 4,000 Units by mouth daily.   Yes [provider]  furosemide (LASIX) 40 MG tablet TAKE 1 TABLET(40 MG) BY MOUTH  TWICE DAILY 01/21/18  Yes Gollan, Kathlene November, MD  gabapentin (NEURONTIN) 100 MG capsule Take 100 mg by mouth 2 (two) times daily.   Yes [provider]  insulin aspart (NOVOLOG) 100 UNIT/ML injection Inject 5-10 Units into the skin 3 (three) times daily before meals. 10 units into the skin before breakfast then 5 units before lunch then 10 units before supper (evening meal)   Yes [provider]  insulin glargine (LANTUS) 100 UNIT/ML injection Inject 14-38 Units into the skin 2 (two) times daily before a meal. 38 units into the skin before breakfast and 14 units before supper (evening meal)   Yes [provider]  iron polysaccharides (NIFEREX) 150 MG capsule Take 150 mg by mouth daily.   Yes [provider]  isosorbide mononitrate (IMDUR) 30 MG 24 hr tablet Take 2 tablets (60 mg total) by mouth 2 (two) times daily. 10/31/17  Yes Minna Merritts, MD  losartan (COZAAR) 100 MG tablet Take 100 mg by mouth every evening.  06/02/15  Yes [provider]  Multiple Vitamins-Minerals (VITEYES AREDS FORMULA/LUTEIN) CAPS Take 1  capsule by mouth 2 (two) times daily.   Yes [provider]  nitroGLYCERIN (NITROSTAT) 0.4 MG SL tablet Place 1 tablet (0.4 mg total) under the tongue every 5 (five) minutes as needed for chest pain. 06/14/15  Yes Mody, Ulice Bold, MD  pantoprazole (PROTONIX) 40 MG tablet Take 40 mg by mouth at bedtime. 06/19/16  Yes [provider]  potassium chloride SA (K-DUR,KLOR-CON) 20 MEQ tablet TAKE 1 TABLET BY MOUTH TWICE DAILY 02/05/18  Yes Gollan, Kathlene November, MD  PREVIDENT 5000 DRY MOUTH 1.1 % GEL dental gel Place 1 application onto teeth daily.    Yes [provider]  sertraline (ZOLOFT) 100 MG tablet Take 100 mg by mouth at bedtime.  06/02/15  Yes [provider]  vitamin C (ASCORBIC ACID) 500 MG tablet Take 500 mg by mouth daily.   Yes [provider]  warfarin (COUMADIN) 4 MG tablet TAKE AS DIRECTED BY COUMADIN  CLINIC 02/12/18  Yes Gollan, Kathlene November, MD     VITAL SIGNS:  Blood pressure (!) 156/73, pulse 92, temperature (!) 100.5 F (38.1 C), temperature source Oral, resp. rate 18, weight 91.6 kg (202 lb), SpO2 91 %.  PHYSICAL EXAMINATION:  Physical Exam  GENERAL:  72 y.o.-year-old patient lying in the bed with no acute distress.  Obese EYES: Pupils equal, round, reactive to light and accommodation. No scleral icterus. Extraocular muscles intact.  HEENT: Head atraumatic, normocephalic. Oropharynx and nasopharynx clear. No oropharyngeal erythema, moist oral mucosa  NECK:  Supple, no jugular venous distention. No thyroid enlargement, no tenderness.  LUNGS: Normal breath sounds bilaterally, no wheezing, rales, rhonchi. No use of accessory muscles of respiration.  CARDIOVASCULAR: S1, S2 normal. No murmurs, rubs, or gallops.  ABDOMEN: Soft, nontender, nondistended. Bowel sounds present. No organomegaly or mass.  EXTREMITIES: No edema, cyanosis, or clubbing. + 2 pedal & radial pulses b/l.   Bilateral below-knee amputation NEUROLOGIC: Cranial nerves II through XII are intact. No focal Motor or sensory deficits appreciated b/l PSYCHIATRIC: The patient is alert and oriented x 3. Good affect.  SKIN: No obvious rash, lesion, or ulcer.   LABORATORY PANEL:   CBC Recent Labs  Lab 03/07/18 0741  WBC 39.4*  HGB 11.8*  HCT 36.7  PLT 195   ------------------------------------------------------------------------------------------------------------------  Chemistries  Recent Labs  Lab 03/07/18 0741  NA 135  K 4.3  CL 103  CO2 22  GLUCOSE 178*  BUN 30*  CREATININE 2.50*  CALCIUM 8.6*  AST 20  ALT 14  ALKPHOS 78  BILITOT 1.1   ------------------------------------------------------------------------------------------------------------------  Cardiac Enzymes No results for input(s): TROPONINI in the last 168  hours. ------------------------------------------------------------------------------------------------------------------  RADIOLOGY:  Ct Chest Wo Contrast  Result Date: 03/07/2018 CLINICAL DATA:  Hx of pacemaker. NKI. Pt reports cough x 1 week with productive green sputum. Pt also reports that per her daughter she has been "acting funny". Pt states "i'm real forgetful anyway". Pt placed on 2L O2 via [place] by this RN due to O2 saturation 90-92% on RA. F/U chest x-ray: No evidence of active disease. No IV contrast due to elevated crea EXAM: CT CHEST WITHOUT CONTRAST TECHNIQUE: Multidetector CT imaging of the chest was performed following the standard protocol without IV contrast. COMPARISON:  None. FINDINGS: Cardiovascular: Left subclavian dual lead AICD. Mild four-chamber cardiac enlargement. No pericardial effusion. Extensive coronary calcifications. Previous CABG. Scattered thoracic aortic calcified plaque without aneurysm. Mediastinum/Nodes: No definite hilar or mediastinal adenopathy. Heterogenous asymmetric thyroid. Lungs/Pleura: Lungs are clear. No pleural effusion or pneumothorax. Upper Abdomen: Multiple  partially calcified subcentimeter stones in the dependent aspect of the nondilated gallbladder. Extensive aortic and branch vessel atheromatous calcifications. No acute findings. Musculoskeletal: Previous median sternotomy. No fracture or worrisome bone lesion. IMPRESSION: 1. No acute findings. 2. Coronary and Aortic Atherosclerosis (ICD10-170.0), post CABG. 3. Cholelithiasis. Electronically Signed   By: Lucrezia Europe M.D.   On: 03/07/2018 10:16   Dg Chest Port 1 View  Result Date: 03/07/2018 CLINICAL DATA:  Weakness EXAM: PORTABLE CHEST 1 VIEW COMPARISON:  06/12/2015 FINDINGS: Normal heart size. Stable mediastinal contours. Two ICD leads in stable position. There is no edema, consolidation, effusion, or pneumothorax. Prior median sternotomy. IMPRESSION: No evidence of active disease. Electronically  Signed   By: Monte Fantasia M.D.   On: 03/07/2018 08:32     IMPRESSION AND PLAN:   *UTI with sepsis present on admission Normal lactic acid. Patient has received 1 L normal saline.  No further IV fluids due to CHF.  Does not look dehydrated. Start IV ceftriaxone.  Cultures sent from the emergency room and pending  *Acute kidney injury over CKD stage III.  Creatinine worse than baseline.  Will repeat labs in the morning.  Monitor input and output.  Has received IV fluids in the emergency room.  *Chronic diastolic and systolic congestive heart failure.  Ejection fraction 40 to 45%.  Continue patient's home Lasix from tomorrow.  Will hold today's dose.  *Hypertension.  Continue home medications  *Diabetes mellitus.  Sliding scale insulin.  Continue patient's home dose of pre-meal insulin and Lantus.  Reduced dose here in the hospital.  Check hemoglobin A1c.  *Paroxysmal atrial fibrillation.  Continue rate control medications.  Pharmacy to dose warfarin.  All the records are reviewed and case discussed with ED provider. Management plans discussed with the patient, family and they are in agreement.  CODE STATUS: DNR  TOTAL TIME TAKING CARE OF THIS PATIENT: 40 minutes.   Leia Alf Delorese Sellin M.D on 03/07/2018 at 11:02 AM  Between 7am to 6pm - Pager - [phone]  After 6pm go to [email or web address] - password [email or web address] [facility]  Office  [phone]  CC: Primary care physician; System, Provider Not In  Note: This dictation was prepared with Dragon dictation along with smaller phrase technology. Any transcriptional errors that result from this process are unintentional.

## 2018-03-07 NOTE — Progress Notes (Signed)
Advance care planning We discussed regarding admission the hospital with UTI.  CODE STATUS discussion. Patient and her healthcare power of attorney daughter present for discussion  Advanced Directives Documents (Living Will, Power of [place]) currently in the EHR no advanced directives documents available .  Has the patient discussed their wishes with their family/healthcare power of attorney yes. How much does the family or healthcare power of attorney know about their wishes. Daughter at bedside aware of wishes  What does the patient/decision maker understand about their medical condition and the natural course of their disease.  Patient has good understanding of her CLL and CKD stage III.  Has close follow-up with nephrology, cardiology and primary care physician  What is the most important goal for this patient should their health condition worsen care focused on comfort .  Current   Code Status: DNR  Current code status has been reviewed/updated.  Time spent:20 minutes

## 2018-03-07 NOTE — Progress Notes (Signed)
CODE SEPSIS - PHARMACY COMMUNICATION  **Broad Spectrum Antibiotics should be administered within 1 hour of Sepsis diagnosis**  Time Code Sepsis Called/Page Received: 0750  Antibiotics Ordered: 0800  Time of 1st antibiotic administration:  Antibiotics Given (last 72 hours)     Date/Time Action Medication Dose Rate   03/07/18 1010 New Bag/Given   cefTRIAXone (ROCEPHIN) 1 g in sodium chloride 0.9 % 100 mL IVPB 1 g 200 mL/hr        Additional action taken by pharmacy: None   If necessary, Name of Provider/Nurse Contacted: none    Thomasenia Sales, PharmD, MBA, BCGP Clinical Pharmacist 03/07/2018  2:45 PM
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
## 2018-03-07 NOTE — ED Notes (Signed)
Report to Chris, RN.
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
## 2018-03-07 NOTE — ED Notes (Signed)
Pt reports cough x 1 week with productive green sputum. Pt also reports that per her daughter she has been "acting funny". Pt states "i'm real forgetful anyway". Pt placed on 2L O2 via [place] by this RN due to O2 saturation 90-92% on RA.

## 2018-03-07 NOTE — Plan of Care (Signed)
  Problem: Education: Goal: Knowledge of General Education information will improve Outcome: Progressing   Problem: Health Behavior/Discharge Planning: Goal: Ability to manage health-related needs will improve Outcome: Progressing   Problem: Clinical Measurements: Goal: Ability to maintain clinical measurements within normal limits will improve Outcome: Progressing Goal: Diagnostic test results will improve Outcome: Progressing Goal: Respiratory complications will improve Outcome: Progressing Note:  02 2l now  Goal: Cardiovascular complication will be avoided Outcome: Progressing   Problem: Activity: Goal: Risk for activity intolerance will decrease Outcome: Progressing   Problem: Nutrition: Goal: Adequate nutrition will be maintained Outcome: Progressing   Problem: Coping: Goal: Level of anxiety will decrease Outcome: Progressing   Problem: Elimination: Goal: Will not experience complications related to bowel motility Outcome: Progressing Goal: Will not experience complications related to urinary retention Outcome: Progressing   Problem: Pain Managment: Goal: General experience of comfort will improve Outcome: Progressing   Problem: Safety: Goal: Ability to remain free from injury will improve Outcome: Progressing   Problem: Skin Integrity: Goal: Risk for impaired skin integrity will decrease Outcome: Progressing

## 2018-03-07 NOTE — Progress Notes (Signed)
ANTICOAGULATION CONSULT NOTE - Initial Consult  Pharmacy Consult for Warfarin Indication: atrial fibrillation  Allergies  Allergen Reactions  . Zosyn [Piperacillin Sod-Tazobactam So] Other (See Comments)    Renal failure    Patient Measurements: Weight: 202 lb (91.6 kg) Heparin Dosing Weight:    Vital Signs: Temp: 98.9 F (37.2 C) (06/27 1439) Temp Source: Oral (06/27 1439) BP: 145/65 (06/27 1439) Pulse Rate: 81 (06/27 1439)  Labs: Recent Labs    03/07/18 0741 03/07/18 1424  HGB 11.8*  --   HCT 36.7  --   PLT 195  --   LABPROT  --  25.4*  INR  --  2.33  CREATININE 2.50*  --     Estimated Creatinine Clearance: 24.8 mL/min (A) (by C-G formula based on SCr of 2.5 mg/dL (H)).   Medical History: Past Medical History:  Diagnosis Date  . Amputation of left lower extremity below knee ([place])   . Amputation of right lower extremity below knee ([place])   . CAD (coronary artery disease)    a. 2004 Cardiac Arrest/CABG x 3 (LIMA->LAD, VG->OM, VG->RCA);  b. 06/2015 lexiscan MV: no significant ischemia, EF 48%, low risk->Med Rx.  . Chronic combined systolic and diastolic CHF (congestive heart failure) ([place])    a. 12/2014 Echo: EF 45-50%;  b. 08/2015 Echo: EF 45-50%, ant, antsept HK, mildly dil LA, nl RV, mild-mod TR, sev PAH (33mmHg).  . CKD (chronic kidney disease), stage IV ([place])   . CLL (chronic lymphocytic leukemia) ([place])   . Diabetes mellitus without complication ([place])   . Essential hypertension   . GERD (gastroesophageal reflux disease)   . Hiatal hernia   . History of cardiac arrest    a. 2004.  Marland Kitchen Hyperlipidemia   . Ischemic cardiomyopathy    a. s/p MDT ICD (originally had 0626 lead-->gen change and lead revision ~ 2012 @ [place]); b. 12/2014 Echo: EF 45-50%;  c.08/2015 Echo: EF 45-50%, ant, antsept HK. Nl RV.  Marland Kitchen PAD (peripheral artery disease) (HCC)    a. s/p BKA  . Persistent atrial fibrillation ([place])    a. Dx 12/2014.  CHA2DS2VASc = 6--> warfarin;  b. 09/2015 s/p DCCV-->on  amio; c. s/p DCCV 04/25/16    Medications:  Scheduled:  . amitriptyline  25 mg Oral QHS  . amLODipine  10 mg Oral Daily  . atorvastatin  40 mg Oral QHS  . carvedilol  3.125 mg Oral BID  . [START ON 03/08/2018] furosemide  40 mg Oral BID  . gabapentin  100 mg Oral BID  . insulin aspart  0-15 Units Subcutaneous TID WC  . insulin aspart  0-5 Units Subcutaneous QHS  . [START ON 03/08/2018] insulin aspart  4 Units Subcutaneous QAC lunch  . insulin aspart  8 Units Subcutaneous BID AC  . insulin glargine  10 Units Subcutaneous QAC supper  . [START ON 03/08/2018] insulin glargine  30 Units Subcutaneous QAC breakfast  . isosorbide mononitrate  60 mg Oral BID  . pantoprazole  40 mg Oral QHS  . potassium chloride SA  20 mEq Oral BID  . sertraline  100 mg Oral QHS  . warfarin  6 mg Oral q1800  . Warfarin - Pharmacist Dosing Inpatient   Does not apply q1800    Assessment: [name] on Warfarin PTA for atrial fibrillation. Home Warfarin dose:  Warfarin 6 mg Mon, Tues, Thur, Fri, Sat  and 4 mg Wed, Sun Hgb 11.8  Plt 195  6/27  INR 2.33    Goal  of Therapy:  INR 2-3 Monitor platelets by anticoagulation protocol: Yes   Plan:  Will continue Warfarin 6 mg po daily-1800 at this time. Patient on antibiotic. F/u INR in am  [place] A 03/07/2018,3:11 PM

## 2018-03-07 NOTE — ED Notes (Signed)
Pt taken to CT at this time.

## 2018-03-08 LAB — BASIC METABOLIC PANEL
Anion gap: 9 (ref 5–15)
BUN: 35 mg/dL — AB (ref 8–23)
CALCIUM: 8.1 mg/dL — AB (ref 8.9–10.3)
CHLORIDE: 106 mmol/L (ref 98–111)
CO2: 20 mmol/L — ABNORMAL LOW (ref 22–32)
Creatinine, Ser: 2.55 mg/dL — ABNORMAL HIGH (ref 0.44–1.00)
GFR calc non Af Amer: 18 mL/min — ABNORMAL LOW (ref >60)
GFR, EST AFRICAN AMERICAN: 21 mL/min — AB (ref >60)
Glucose, Bld: 153 mg/dL — ABNORMAL HIGH (ref 70–99)
Potassium: 4.8 mmol/L (ref 3.5–5.1)
SODIUM: 135 mmol/L (ref 135–145)

## 2018-03-08 LAB — CBC
HCT: 32.8 % — ABNORMAL LOW (ref 35.0–47.0)
Hemoglobin: 10.6 g/dL — ABNORMAL LOW (ref 12.0–16.0)
MCH: 27.8 pg (ref 26.0–34.0)
MCHC: 32.1 g/dL (ref 32.0–36.0)
MCV: 86.4 fL (ref 80.0–100.0)
Platelets: 170 10*3/uL (ref 150–440)
RBC: 3.8 MIL/uL (ref 3.80–5.20)
RDW: 16 % — ABNORMAL HIGH (ref 11.5–14.5)
WBC: 39.9 10*3/uL — AB (ref 3.6–11.0)

## 2018-03-08 LAB — GLUCOSE, CAPILLARY
GLUCOSE-CAPILLARY: 136 mg/dL — AB (ref 70–99)
GLUCOSE-CAPILLARY: 129 mg/dL — AB (ref 70–99)
GLUCOSE-CAPILLARY: 119 mg/dL — AB (ref 70–99)
Glucose-Capillary: 143 mg/dL — ABNORMAL HIGH (ref 70–99)

## 2018-03-08 LAB — HEMOGLOBIN A1C
Hgb A1c MFr Bld: 9.1 % — ABNORMAL HIGH (ref 4.8–5.6)
MEAN PLASMA GLUCOSE: 214.47 mg/dL

## 2018-03-08 LAB — HIV ANTIBODY (ROUTINE TESTING W REFLEX): HIV Screen 4th Generation wRfx: NONREACTIVE

## 2018-03-08 LAB — PROTIME-INR
INR: 2.26
Prothrombin Time: 24.8 seconds — ABNORMAL HIGH (ref 11.4–15.2)

## 2018-03-08 MED ORDER — SODIUM CHLORIDE 0.9% FLUSH
3.0000 mL | Freq: Two times a day (BID) | INTRAVENOUS | Status: DC
  Administered 2018-03-08 – 2018-03-09 (×4): 3 mL via INTRAVENOUS

## 2018-03-08 MED ORDER — SODIUM CHLORIDE 0.9% FLUSH
3.0000 mL | Freq: Two times a day (BID) | INTRAVENOUS | Status: DC
  Administered 2018-03-08 – 2018-03-10 (×5): 3 mL via INTRAVENOUS

## 2018-03-08 NOTE — Progress Notes (Signed)
[facility]  Next of kin: Jerrell Mylar sister [phone]  PCP: Sueanne Margarita  If patient discharges after hours, please call [phone].   Caroline Hernandez 03/08/2018, 11:36 AM
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
## 2018-03-08 NOTE — Progress Notes (Signed)
ANTICOAGULATION CONSULT NOTE - Initial Consult  Pharmacy Consult for Warfarin Indication: atrial fibrillation  Allergies  Allergen Reactions  . Zosyn [Piperacillin Sod-Tazobactam So] Other (See Comments)    Renal failure    Patient Measurements: Height: 5\' 8"  (172.7 cm) Weight: 245 lb 9.5 oz (111.4 kg) IBW/kg (Calculated) : 63.9 Heparin Dosing Weight:    Vital Signs: Temp: 99.7 F (37.6 C) (06/28 0528) Temp Source: Oral (06/28 0528) BP: 140/73 (06/28 0528) Pulse Rate: 82 (06/28 0528)  Labs: Recent Labs    03/07/18 0741 03/07/18 1424 03/08/18 0511  HGB 11.8*  --  10.6*  HCT 36.7  --  32.8*  PLT 195  --  170  LABPROT  --  25.4* 24.8*  INR  --  2.33 2.26  CREATININE 2.50*  --  2.55*    Estimated Creatinine Clearance: 26.9 mL/min (A) (by C-G formula based on SCr of 2.55 mg/dL (H)).   Medical History: Past Medical History:  Diagnosis Date  . Amputation of left lower extremity below knee ([place])   . Amputation of right lower extremity below knee ([place])   . CAD (coronary artery disease)    a. 2004 Cardiac Arrest/CABG x 3 (LIMA->LAD, VG->OM, VG->RCA);  b. 06/2015 lexiscan MV: no significant ischemia, EF 48%, low risk->Med Rx.  . Chronic combined systolic and diastolic CHF (congestive heart failure) ([place])    a. 12/2014 Echo: EF 45-50%;  b. 08/2015 Echo: EF 45-50%, ant, antsept HK, mildly dil LA, nl RV, mild-mod TR, sev PAH (74mmHg).  . CKD (chronic kidney disease), stage IV ([place])   . CLL (chronic lymphocytic leukemia) ([place])   . Diabetes mellitus without complication ([place])   . Essential hypertension   . GERD (gastroesophageal reflux disease)   . Hiatal hernia   . History of cardiac arrest    a. 2004.  Marland Kitchen Hyperlipidemia   . Ischemic cardiomyopathy    a. s/p MDT ICD (originally had 9417 lead-->gen change and lead revision ~ 2012 @ [place]); b. 12/2014 Echo: EF 45-50%;  c.08/2015 Echo: EF 45-50%, ant, antsept HK. Nl RV.  Marland Kitchen PAD (peripheral artery disease) ([facility])    a. s/p BKA  .  Persistent atrial fibrillation ([place])    a. Dx 12/2014.  CHA2DS2VASc = 6--> warfarin;  b. 09/2015 s/p DCCV-->on amio; c. s/p DCCV 04/25/16    Medications:  Scheduled:  . amitriptyline  25 mg Oral QHS  . amLODipine  10 mg Oral Daily  . atorvastatin  40 mg Oral QHS  . carvedilol  3.125 mg Oral BID  . furosemide  40 mg Oral BID  . gabapentin  100 mg Oral BID  . insulin aspart  0-15 Units Subcutaneous TID WC  . insulin aspart  0-5 Units Subcutaneous QHS  . insulin aspart  4 Units Subcutaneous QAC lunch  . insulin aspart  8 Units Subcutaneous BID AC  . insulin glargine  10 Units Subcutaneous QAC supper  . insulin glargine  30 Units Subcutaneous QAC breakfast  . isosorbide mononitrate  60 mg Oral BID  . pantoprazole  40 mg Oral QHS  . potassium chloride SA  20 mEq Oral BID  . sertraline  100 mg Oral QHS  . warfarin  6 mg Oral q1800  . Warfarin - Pharmacist Dosing Inpatient   Does not apply q1800    Assessment: [name] on Warfarin PTA for atrial fibrillation. Home Warfarin dose:  Warfarin 6 mg Mon, Tues, Thur, Fri, Sat  and 4 mg Wed, Sun Hgb 10.6  Plt  32.8  6/27  INR 2.33 - 6 mg 6/28  INR 2.26 - 6 mg   Goal of Therapy:  INR 2-3 Monitor platelets by anticoagulation protocol: Yes   Plan:  She is currently on 6mg  warfarin daily with a stable INR and no new significant DDIs. This dose will be maintained and pharmacy will follow-up INR on 6/29 and make any adjustments if needed.  Dallie Piles, PharmD 03/08/2018,7:23 AM

## 2018-03-08 NOTE — Care Management Note (Signed)
Case Management Note  Patient Details  Name: Caroline Hernandez MRN: [id number] Date of Birth: 1946-07-13  Subjective/Objective:                  Admitted to [facility] with the diagnosis of urinary tract infection. Lives with daughter, Janyth Contes [phone]). Seen Dr. Marcha Dutton at [facility] 2 months ago. Prescriptions are filled at [facility] [place] in the past, doesn't remember name of agency. [place] x in the past for skilled nursing. No home oxygen. Electric scooter, wheelchair, rolling walker, and shower bench in the home. Takes care of all basic activities of daily living herself, doesn't drive. Last fall was a year ago. Decreased appetite Daughter will transport Discharge 03/08/18 per Dr. Posey Pronto  Action/Plan: Physical therapy evaluation completed. Recommending home with physical therapy. [place]. Will update Floydene Flock, [facility] representative   Expected Discharge Date:  03/08/18               Expected Discharge Plan:     In-House Referral:   yes  Discharge planning Services   yes  Post Acute Care Choice:   yes Choice offered to:   Ms Dema Severin   DME Arranged:    DME Agency:     [facility] Arranged:   yes  [place] Agency:   [facility]  Status of Service:     If discussed at [place] of Stay Meetings, dates discussed:    Additional Comments:  Shelbie Ammons, RN MSN CCM Care Management [phone] 03/08/2018, 10:55 AM

## 2018-03-08 NOTE — Progress Notes (Signed)
Caroline Hernandez at [place] NAME: Caroline Hernandez    MR#:  [id number]  DATE OF BIRTH:  10-18-1945  SUBJECTIVE:   Patient came in with low-grade fever not feeling well weakness found to have UTI. She is feeling a little bit better. Has some low-grade fever. No vomiting. No family in the room. REVIEW OF SYSTEMS:   Review of Systems  Constitutional: Negative for chills, fever and weight loss.  HENT: Negative for ear discharge, ear pain and nosebleeds.   Eyes: Negative for blurred vision, pain and discharge.  Respiratory: Negative for sputum production, shortness of breath, wheezing and stridor.   Cardiovascular: Negative for chest pain, palpitations, orthopnea and PND.  Gastrointestinal: Negative for abdominal pain, diarrhea, nausea and vomiting.  Genitourinary: Negative for frequency and urgency.  Musculoskeletal: Negative for back pain and joint pain.  Neurological: Negative for sensory change, speech change, focal weakness and weakness.  Psychiatric/Behavioral: Negative for depression and hallucinations. The patient is not nervous/anxious.    Tolerating Diet:yes Tolerating PT: HHPT  DRUG ALLERGIES:   Allergies  Allergen Reactions  . Zosyn [Piperacillin Sod-Tazobactam So] Other (See Comments)    Renal failure    VITALS:  Blood pressure 140/73, pulse 82, temperature 99.7 F (37.6 C), temperature source Oral, resp. rate 20, height 5\' 8"  (1.727 m), weight 111.4 kg (245 lb 9.5 oz), SpO2 93 %.  PHYSICAL EXAMINATION:   Physical Exam  GENERAL:  72 y.o.-year-old patient lying in the bed with no acute distress.  EYES: Pupils equal, round, reactive to light and accommodation. No scleral icterus. Extraocular muscles intact.  HEENT: Head atraumatic, normocephalic. Oropharynx and nasopharynx clear.  NECK:  Supple, no jugular venous distention. No thyroid enlargement, no tenderness.  LUNGS: Normal breath sounds bilaterally, no wheezing, rales,  rhonchi. No use of accessory muscles of respiration.  CARDIOVASCULAR: S1, S2 normal. No murmurs, rubs, or gallops.  ABDOMEN: Soft, nontender, nondistended. Bowel sounds present. No organomegaly or mass.  EXTREMITIES: No cyanosis, clubbing or edema b/l.   Lateral below knee amputation. NEUROLOGIC: Cranial nerves II through XII are intact. No focal Motor or sensory deficits b/l.   PSYCHIATRIC:  patient is alert and oriented x 3.  SKIN: No obvious rash, lesion, or ulcer.   LABORATORY PANEL:  CBC Recent Labs  Lab 03/08/18 0511  WBC 39.9*  HGB 10.6*  HCT 32.8*  PLT 170    Chemistries  Recent Labs  Lab 03/07/18 0741 03/08/18 0511  NA 135 135  K 4.3 4.8  CL 103 106  CO2 22 20*  GLUCOSE 178* 153*  BUN 30* 35*  CREATININE 2.50* 2.55*  CALCIUM 8.6* 8.1*  AST 20  --   ALT 14  --   ALKPHOS 78  --   BILITOT 1.1  --    Cardiac Enzymes No results for input(s): TROPONINI in the last 168 hours. RADIOLOGY:  Ct Chest Wo Contrast  Result Date: 03/07/2018 CLINICAL DATA:  Hx of pacemaker. NKI. Pt reports cough x 1 week with productive green sputum. Pt also reports that per her daughter she has been "acting funny". Pt states "i'm real forgetful anyway". Pt placed on 2L O2 via [place] by this RN due to O2 saturation 90-92% on RA. F/U chest x-ray: No evidence of active disease. No IV contrast due to elevated crea EXAM: CT CHEST WITHOUT CONTRAST TECHNIQUE: Multidetector CT imaging of the chest was performed following the standard protocol without IV contrast. COMPARISON:  None. FINDINGS: Cardiovascular: Left subclavian dual  lead AICD. Mild four-chamber cardiac enlargement. No pericardial effusion. Extensive coronary calcifications. Previous CABG. Scattered thoracic aortic calcified plaque without aneurysm. Mediastinum/Nodes: No definite hilar or mediastinal adenopathy. Heterogenous asymmetric thyroid. Lungs/Pleura: Lungs are clear. No pleural effusion or pneumothorax. Upper Abdomen: Multiple partially  calcified subcentimeter stones in the dependent aspect of the nondilated gallbladder. Extensive aortic and branch vessel atheromatous calcifications. No acute findings. Musculoskeletal: Previous median sternotomy. No fracture or worrisome bone lesion. IMPRESSION: 1. No acute findings. 2. Coronary and Aortic Atherosclerosis (ICD10-170.0), post CABG. 3. Cholelithiasis. Electronically Signed   By: Lucrezia Europe M.D.   On: 03/07/2018 10:16   Dg Chest Port 1 View  Result Date: 03/07/2018 CLINICAL DATA:  Weakness EXAM: PORTABLE CHEST 1 VIEW COMPARISON:  06/12/2015 FINDINGS: Normal heart size. Stable mediastinal contours. Two ICD leads in stable position. There is no edema, consolidation, effusion, or pneumothorax. Prior median sternotomy. IMPRESSION: No evidence of active disease. Electronically Signed   By: Monte Fantasia M.D.   On: 03/07/2018 08:32   ASSESSMENT AND PLAN:  Caroline Hernandez  is a 72 y.o. female with a known history of CLL, CKD stage III, diabetes, hypertension, paroxysmal atrial fibrillation, systolic congestive heart failure, bilateral below-knee amputation with prosthesis presents to the emergency room with 1 week of weakness.  She has also had some soft stools over the last week 1-2 episodes a day  *UTI with sepsis present on admission Normal lactic acid. Patient has received 1 L normal saline.  No further IV fluids due to CHF.  Does not look dehydrated. Cont IV ceftriaxone. -BC negative -UC pending  *Acute kidney injury over CKD stage III.  Creatinine worse than baseline.    Monitor input and output.  Has received IV fluids in the emergency room. -crea 2.50--2.55 -baseline creat 2.10--pt advised to keep oral hydration. -appears well hydrated  *Chronic diastolic and systolic congestive heart failure.  Ejection fraction 40 to 45%.  - Continue patient's home Lasix from today.    *Hypertension.  Continue home medications  *Diabetes mellitus.  Sliding scale insulin.  Continue  patient's home dose of pre-meal insulin and Lantus.  Reduced dose here in the hospital.  Check hemoglobin A1c.  *Paroxysmal atrial fibrillation.  Continue rate control medications.  Pharmacy to dose warfarin.  PT recomemnds HHPT continues to improve discharged home tomorrow. Patient agreeable.  Case discussed with Care Management/Social Worker. Management plans discussed with the patient, family and they are in agreement.  CODE STATUS: DNR  DVT Prophylaxis: warfarin  TOTAL TIME TAKING CARE OF THIS PATIENT: *35 minutes.  >50% time spent on counselling and coordination of care  POSSIBLE D/C IN 1 DAYS, DEPENDING ON CLINICAL CONDITION.  Note: This dictation was prepared with Dragon dictation along with smaller phrase technology. Any transcriptional errors that result from this process are unintentional.  Fritzi Mandes M.D on 03/08/2018 at 11:41 AM  Between 7am to 6pm - Pager - [phone]  After 6pm go to [email or web address] - password [email or web address]  [facility] [place] [facility]  Office  [phone]  CC: Primary care physician; System, Provider Not InPatient ID: Laquitha Heslin, female   DOB: [birth year], 72 y.o.   MRN: [id number]

## 2018-03-09 ENCOUNTER — Inpatient Hospital Stay: Payer: Medicare HMO

## 2018-03-09 LAB — GLUCOSE, CAPILLARY
GLUCOSE-CAPILLARY: 159 mg/dL — AB (ref 70–99)
GLUCOSE-CAPILLARY: 177 mg/dL — AB (ref 70–99)
GLUCOSE-CAPILLARY: 175 mg/dL — AB (ref 70–99)
Glucose-Capillary: 155 mg/dL — ABNORMAL HIGH (ref 70–99)

## 2018-03-09 LAB — URINE CULTURE

## 2018-03-09 LAB — PROTIME-INR
INR: 2.42
Prothrombin Time: 26.1 seconds — ABNORMAL HIGH (ref 11.4–15.2)

## 2018-03-09 MED ORDER — LORAZEPAM 0.5 MG PO TABS
0.5000 mg | ORAL_TABLET | Freq: Once | ORAL | Status: AC
  Administered 2018-03-09: 14:00:00 0.5 mg via ORAL
  Filled 2018-03-09: qty 1

## 2018-03-09 MED ORDER — IPRATROPIUM-ALBUTEROL 0.5-2.5 (3) MG/3ML IN SOLN
3.0000 mL | Freq: Once | RESPIRATORY_TRACT | Status: AC
  Administered 2018-03-09: 12:00:00 3 mL via RESPIRATORY_TRACT
  Filled 2018-03-09: qty 3

## 2018-03-09 MED ORDER — METHYLPREDNISOLONE SODIUM SUCC 125 MG IJ SOLR
60.0000 mg | INTRAMUSCULAR | Status: DC
  Administered 2018-03-09 – 2018-03-10 (×2): 60 mg via INTRAVENOUS
  Filled 2018-03-09 (×2): qty 2

## 2018-03-09 MED ORDER — FUROSEMIDE 10 MG/ML IJ SOLN
60.0000 mg | Freq: Once | INTRAMUSCULAR | Status: AC
  Administered 2018-03-09: 12:00:00 60 mg via INTRAVENOUS
  Filled 2018-03-09: qty 6

## 2018-03-09 NOTE — Progress Notes (Signed)
[place] at [place] NAME: Caroline Hernandez    MR#:  [id number]  DATE OF BIRTH:  Nov 28, 1945  SUBJECTIVE:   Complains of cough and shortness of breath.  Orthopnea present.  Feels poorly.  Weak.  REVIEW OF SYSTEMS:   Review of Systems  Constitutional: Negative for chills, fever and weight loss.  HENT: Negative for ear discharge, ear pain and nosebleeds.   Eyes: Negative for blurred vision, pain and discharge.  Respiratory: Positive for cough and shortness of breath. Negative for sputum production, wheezing and stridor.   Cardiovascular: Negative for chest pain, palpitations, orthopnea and PND.  Gastrointestinal: Negative for abdominal pain, diarrhea, nausea and vomiting.  Genitourinary: Negative for frequency and urgency.  Musculoskeletal: Negative for back pain and joint pain.  Neurological: Negative for sensory change, speech change, focal weakness and weakness.  Psychiatric/Behavioral: Negative for depression and hallucinations. The patient is not nervous/anxious.     DRUG ALLERGIES:   Allergies  Allergen Reactions  . Zosyn [Piperacillin Sod-Tazobactam So] Other (See Comments)    Renal failure    VITALS:  Blood pressure (!) 148/84, pulse 88, temperature 99.3 F (37.4 C), temperature source Oral, resp. rate (!) 22, height 5\' 8"  (1.727 m), weight 109.4 kg (241 lb 1.6 oz), SpO2 94 %.  PHYSICAL EXAMINATION:   Physical Exam  GENERAL:  72 y.o.-year-old patient lying in the bed with no acute distress.  EYES: Pupils equal, round, reactive to light and accommodation. No scleral icterus. Extraocular muscles intact.  HEENT: Head atraumatic, normocephalic. Oropharynx and nasopharynx clear.  NECK:  Supple, no jugular venous distention. No thyroid enlargement, no tenderness.  LUNGS: Normal breath sounds bilaterally, no wheezing, rales, rhonchi. No use of accessory muscles of respiration.  CARDIOVASCULAR: S1, S2 normal. No murmurs, rubs,  or gallops.  ABDOMEN: Soft, nontender, nondistended. Bowel sounds present. No organomegaly or mass.  EXTREMITIES: No cyanosis, clubbing or edema b/l.   Lateral below knee amputation. NEUROLOGIC: Cranial nerves II through XII are intact. No focal Motor or sensory deficits b/l. PSYCHIATRIC:  patient is alert and oriented x 3.  SKIN: No obvious rash, lesion, or ulcer.   LABORATORY PANEL:  CBC Recent Labs  Lab 03/08/18 0511  WBC 39.9*  HGB 10.6*  HCT 32.8*  PLT 170    Chemistries  Recent Labs  Lab 03/07/18 0741 03/08/18 0511  NA 135 135  K 4.3 4.8  CL 103 106  CO2 22 20*  GLUCOSE 178* 153*  BUN 30* 35*  CREATININE 2.50* 2.55*  CALCIUM 8.6* 8.1*  AST 20  --   ALT 14  --   ALKPHOS 78  --   BILITOT 1.1  --    Cardiac Enzymes No results for input(s): TROPONINI in the last 168 hours. RADIOLOGY:  Dg Chest 2 View  Result Date: 03/09/2018 CLINICAL DATA:  Low-grade fever. Weakness. Urinary tract infection. EXAM: CHEST - 2 VIEW COMPARISON:  03/07/2018. FINDINGS: Poor inspiration. Borderline enlarged cardiac silhouette. Mildly prominent pulmonary vasculature and interstitial markings. Mild diffuse peribronchial thickening. Stable median sternotomy wires and left subclavian AICD leads. Unremarkable bones. IMPRESSION: 1. Mild bronchitic changes. 2. Interval mild cardiomegaly and mild pulmonary vascular congestion with possible mild interstitial pulmonary edema. This is accentuated by the AP technique and poor inspiration. Electronically Signed   By: Claudie Revering M.D.   On: 03/09/2018 10:57   ASSESSMENT AND PLAN:  Caroline Hernandez  is a 72 y.o. female with a known history of CLL, CKD stage III,  diabetes, hypertension, paroxysmal atrial fibrillation, systolic congestive heart failure, bilateral below-knee amputation with prosthesis presents to the emergency room with 1 week of weakness.  She has also had some soft stools over the last week 1-2 episodes a day  *Acute on chronic systolic  congestive heart failure We will give 1 dose of IV Lasix 60 mg.  Continue oral Lasix from home.  Monitor input and output.  Replace potassium as needed.  *UTI with sepsis present on admission Normal lactic acid. Patient has received 1 L normal saline.  No further IV fluids due to CHF.  Does not look dehydrated. Cont IV ceftriaxone. -BC negative -UC pending  *Acute kidney injury over CKD stage III. Stable  *Hypertension.  Continue home medications  *Diabetes mellitus.  Sliding scale insulin.  Continue patient's home dose of pre-meal insulin and Lantus.  Reduced dose here in the hospital.    *Paroxysmal atrial fibrillation.  Continue rate control medications.  Pharmacy to dose warfarin.   Case discussed with Care Management/Social Worker. Management plans discussed with the patient, family and they are in agreement.  CODE STATUS: DNR  DVT Prophylaxis: warfarin   TOTAL TIME TAKING CARE OF THIS PATIENT: 35 minutes.   POSSIBLE D/C IN 1-2 DAYS, DEPENDING ON CLINICAL CONDITION.  Note: This dictation was prepared with Dragon dictation along with smaller phrase technology. Any transcriptional errors that result from this process are unintentional.  Caroline Hernandez M.D on 03/09/2018 at 12:53 PM  Between 7am to 6pm - Pager - [phone]  After 6pm go to [email or web address] - password [email or web address]  [facility] [place] [facility]  Office  [phone]  CC: Primary care physician; System, Provider Not InPatient ID: Caroline Hernandez, female   DOB: [birth year], 72 y.o.   MRN: [id number]

## 2018-03-09 NOTE — Progress Notes (Signed)
[place] for Warfarin Indication: atrial fibrillation  Allergies  Allergen Reactions  . Zosyn [Piperacillin Sod-Tazobactam So] Other (See Comments)    Renal failure    Patient Measurements: Height: 5\' 8"  (172.7 cm) Weight: 241 lb 1.6 oz (109.4 kg) IBW/kg (Calculated) : 63.9 Heparin Dosing Weight:    Vital Signs: Temp: 98.7 F (37.1 C) (06/29 0608) Temp Source: Oral (06/29 0608) BP: 145/64 (06/29 0608) Pulse Rate: 87 (06/29 0608)  Labs: Recent Labs    03/07/18 0741 03/07/18 1424 03/08/18 0511 03/09/18 0432  HGB 11.8*  --  10.6*  --   HCT 36.7  --  32.8*  --   PLT 195  --  170  --   LABPROT  --  25.4* 24.8* 26.1*  INR  --  2.33 2.26 2.42  CREATININE 2.50*  --  2.55*  --     Estimated Creatinine Clearance: 26.6 mL/min (A) (by C-G formula based on SCr of 2.55 mg/dL (H)).   Medical History: Past Medical History:  Diagnosis Date  . Amputation of left lower extremity below knee ([place])   . Amputation of right lower extremity below knee ([place])   . CAD (coronary artery disease)    a. 2004 Cardiac Arrest/CABG x 3 (LIMA->LAD, VG->OM, VG->RCA);  b. 06/2015 lexiscan MV: no significant ischemia, EF 48%, low risk->Med Rx.  . Chronic combined systolic and diastolic CHF (congestive heart failure) ([place])    a. 12/2014 Echo: EF 45-50%;  b. 08/2015 Echo: EF 45-50%, ant, antsept HK, mildly dil LA, nl RV, mild-mod TR, sev PAH (27mmHg).  . CKD (chronic kidney disease), stage IV ([place])   . CLL (chronic lymphocytic leukemia) ([place])   . Diabetes mellitus without complication ([place])   . Essential hypertension   . GERD (gastroesophageal reflux disease)   . Hiatal hernia   . History of cardiac arrest    a. 2004.  Marland Kitchen Hyperlipidemia   . Ischemic cardiomyopathy    a. s/p MDT ICD (originally had 7341 lead-->gen change and lead revision ~ 2012 @ [place]); b. 12/2014 Echo: EF 45-50%;  c.08/2015 Echo: EF 45-50%, ant, antsept HK. Nl RV.  Marland Kitchen PAD (peripheral artery  disease) (HCC)    a. s/p BKA  . Persistent atrial fibrillation ([place])    a. Dx 12/2014.  CHA2DS2VASc = 6--> warfarin;  b. 09/2015 s/p DCCV-->on amio; c. s/p DCCV 04/25/16    Medications:  Scheduled:  . amitriptyline  25 mg Oral QHS  . amLODipine  10 mg Oral Daily  . atorvastatin  40 mg Oral QHS  . carvedilol  3.125 mg Oral BID  . furosemide  40 mg Oral BID  . gabapentin  100 mg Oral BID  . insulin aspart  0-15 Units Subcutaneous TID WC  . insulin aspart  0-5 Units Subcutaneous QHS  . insulin aspart  4 Units Subcutaneous QAC lunch  . insulin aspart  8 Units Subcutaneous BID AC  . insulin glargine  10 Units Subcutaneous QAC supper  . insulin glargine  30 Units Subcutaneous QAC breakfast  . isosorbide mononitrate  60 mg Oral BID  . pantoprazole  40 mg Oral QHS  . potassium chloride SA  20 mEq Oral BID  . sertraline  100 mg Oral QHS  . sodium chloride flush  3 mL Intravenous Q12H  . sodium chloride flush  3 mL Intravenous Q12H  . warfarin  6 mg Oral q1800  . Warfarin - Pharmacist Dosing Inpatient   Does not apply [phone]  Assessment: [name] on Warfarin PTA for atrial fibrillation. Home Warfarin dose:  Warfarin 6 mg Mon, Tues, Thur, Fri, Sat  and 4 mg Wed, Sun Hgb 10.6  Plt 32.8  6/27  INR 2.33 - 6 mg 6/28  INR 2.26 - 6 mg 6/29  INR 2.42   Goal of Therapy:  INR 2-3 Monitor platelets by anticoagulation protocol: Yes   Plan:  She is currently on 6mg  warfarin daily with a stable INR and no new significant DDIs. This dose will be maintained and pharmacy will follow-up INR on 6/30 and make any adjustments if needed. Patient on antibiotics.  Motty Borin A, PharmD 03/09/2018,7:50 AM

## 2018-03-10 LAB — PROTIME-INR
INR: 2.86
PROTHROMBIN TIME: 29.8 s — AB (ref 11.4–15.2)

## 2018-03-10 LAB — GLUCOSE, CAPILLARY
GLUCOSE-CAPILLARY: 220 mg/dL — AB (ref 70–99)
GLUCOSE-CAPILLARY: 260 mg/dL — AB (ref 70–99)

## 2018-03-10 MED ORDER — PREDNISONE 20 MG PO TABS: 40.0000 mg | ORAL_TABLET | Freq: Every day | ORAL | 0 refills | Status: DC

## 2018-03-10 MED ORDER — CEPHALEXIN 250 MG PO CAPS
250.0000 mg | ORAL_CAPSULE | Freq: Two times a day (BID) | ORAL | Status: DC
  Administered 2018-03-10: 11:00:00 250 mg via ORAL
  Filled 2018-03-10 (×2): qty 1

## 2018-03-10 MED ORDER — WARFARIN SODIUM 4 MG PO TABS
4.0000 mg | ORAL_TABLET | Freq: Once | ORAL | Status: DC
  Filled 2018-03-10: qty 1

## 2018-03-10 MED ORDER — CEPHALEXIN 250 MG PO CAPS: 250.0000 mg | ORAL_CAPSULE | Freq: Two times a day (BID) | ORAL | 0 refills | Status: DC

## 2018-03-10 MED ORDER — ALBUTEROL SULFATE HFA 108 (90 BASE) MCG/ACT IN AERS: 2.0000 | INHALATION_SPRAY | Freq: Four times a day (QID) | RESPIRATORY_TRACT | 0 refills | Status: DC | PRN

## 2018-03-10 NOTE — Discharge Instructions (Signed)
Resume diet and activity as before ° ° °

## 2018-03-10 NOTE — Care Management Note (Signed)
Case Management Note  Patient Details  Name: Caroline Hernandez MRN: [id number] Date of Birth: December 17, 1945  Subjective/Objective:  Patient to be discharged home. Orders for home health upon discharge. Patient has previously been worked up for services via [facility] care. RNCM spoke with patient and this is still their preference. Notified [name] from advanced home care of discharge. Patient has all necessary DME in the home. Family to provide transport.  [name] RN BSN RNCM [phone]                   Action/Plan:   Expected Discharge Date:  03/10/18               Expected Discharge Plan:     In-House Referral:     Discharge planning Services  CM Consult  Post Acute Care Choice:  Home Health Choice offered to:  Patient  DME Arranged:    DME Agency:     HH Arranged:  RN, PT [facility] Agency:  [facility]  Status of Service:  Completed, signed off  If discussed at [place] of Stay Meetings, dates discussed:    Additional Comments:  [name], RN 03/10/2018, 11:31 AM

## 2018-03-10 NOTE — Progress Notes (Signed)
Pt is being discharged home with [facility]. Discharge papers given and explained to pt. Pt verbalized understanding. Meds reviewed. RX to be picked up from pharmacy.

## 2018-03-10 NOTE — Progress Notes (Signed)
[place] for Warfarin Indication: atrial fibrillation  Allergies  Allergen Reactions  . Zosyn [Piperacillin Sod-Tazobactam So] Other (See Comments)    Renal failure    Patient Measurements: Height: 5\' 8"  (172.7 cm) Weight: 244 lb 14.9 oz (111.1 kg) IBW/kg (Calculated) : 63.9 Heparin Dosing Weight:    Vital Signs: Temp: 98.2 F (36.8 C) (06/30 0444) Temp Source: Oral (06/30 0444) BP: 128/59 (06/30 0444) Pulse Rate: 81 (06/30 0451)  Labs: Recent Labs    03/08/18 0511 03/09/18 0432 03/10/18 0413  HGB 10.6*  --   --   HCT 32.8*  --   --   PLT 170  --   --   LABPROT 24.8* 26.1* 29.8*  INR 2.26 2.42 2.86  CREATININE 2.55*  --   --     Estimated Creatinine Clearance: 26.8 mL/min (A) (by C-G formula based on SCr of 2.55 mg/dL (H)).   Medical History: Past Medical History:  Diagnosis Date  . Amputation of left lower extremity below knee ([place])   . Amputation of right lower extremity below knee ([place])   . CAD (coronary artery disease)    a. 2004 Cardiac Arrest/CABG x 3 (LIMA->LAD, VG->OM, VG->RCA);  b. 06/2015 lexiscan MV: no significant ischemia, EF 48%, low risk->Med Rx.  . Chronic combined systolic and diastolic CHF (congestive heart failure) ([place])    a. 12/2014 Echo: EF 45-50%;  b. 08/2015 Echo: EF 45-50%, ant, antsept HK, mildly dil LA, nl RV, mild-mod TR, sev PAH (49mmHg).  . CKD (chronic kidney disease), stage IV ([place])   . CLL (chronic lymphocytic leukemia) ([place])   . Diabetes mellitus without complication ([place])   . Essential hypertension   . GERD (gastroesophageal reflux disease)   . Hiatal hernia   . History of cardiac arrest    a. 2004.  Marland Kitchen Hyperlipidemia   . Ischemic cardiomyopathy    a. s/p MDT ICD (originally had 9833 lead-->gen change and lead revision ~ 2012 @ [place]); b. 12/2014 Echo: EF 45-50%;  c.08/2015 Echo: EF 45-50%, ant, antsept HK. Nl RV.  Marland Kitchen PAD (peripheral artery disease) (HCC)    a. s/p BKA  . Persistent  atrial fibrillation ([place])    a. Dx 12/2014.  CHA2DS2VASc = 6--> warfarin;  b. 09/2015 s/p DCCV-->on amio; c. s/p DCCV 04/25/16    Medications:  Scheduled:  . amitriptyline  25 mg Oral QHS  . amLODipine  10 mg Oral Daily  . atorvastatin  40 mg Oral QHS  . carvedilol  3.125 mg Oral BID  . furosemide  40 mg Oral BID  . gabapentin  100 mg Oral BID  . insulin aspart  0-15 Units Subcutaneous TID WC  . insulin aspart  0-5 Units Subcutaneous QHS  . insulin aspart  4 Units Subcutaneous QAC lunch  . insulin aspart  8 Units Subcutaneous BID AC  . insulin glargine  10 Units Subcutaneous QAC supper  . insulin glargine  30 Units Subcutaneous QAC breakfast  . isosorbide mononitrate  60 mg Oral BID  . methylPREDNISolone (SOLU-MEDROL) injection  60 mg Intravenous Q24H  . pantoprazole  40 mg Oral QHS  . potassium chloride SA  20 mEq Oral BID  . sertraline  100 mg Oral QHS  . sodium chloride flush  3 mL Intravenous Q12H  . sodium chloride flush  3 mL Intravenous Q12H  . warfarin  6 mg Oral q1800  . Warfarin - Pharmacist Dosing Inpatient   Does not apply [phone]  Assessment: [name] on Warfarin PTA for atrial fibrillation. Home Warfarin dose:  Warfarin 6 mg Mon, Tues, Thur, Fri, Sat  and 4 mg Wed, Sun Hgb 10.6  Plt 32.8  6/27  INR 2.33 - 6 mg 6/28  INR 2.26 - 6 mg 6/29  INR 2.42 - 6 mg 6/30  INR 2.86   Goal of Therapy:  INR 2-3 Monitor platelets by anticoagulation protocol: Yes   Plan:  Jump in INR. Will follow patinet's home regimen and give 4 mg today(Sunday). pharmacy will follow-up INR.  Patient on antibiotics.  Neville Pauls A, PharmD 03/10/2018,9:13 AM

## 2018-03-12 LAB — CULTURE, BLOOD (ROUTINE X 2)
CULTURE: NO GROWTH
Culture: NO GROWTH
Special Requests: ADEQUATE
Special Requests: ADEQUATE

## 2018-03-15 ENCOUNTER — Encounter: Payer: Self-pay | Admitting: Cardiology

## 2018-03-21 NOTE — Discharge Summary (Signed)
Caroline Hernandez NAME: Caroline Hernandez    MR#:  [id number]  DATE OF BIRTH:  [birth year]  DATE OF ADMISSION:  03/07/2018 ADMITTING PHYSICIAN: [name], MD  DATE OF DISCHARGE: 03/10/2018 12:42 PM  PRIMARY CARE PHYSICIAN: System, Provider Not In   ADMISSION DIAGNOSIS:  Shortness of breath [R06.02] Lower urinary tract infectious disease [N39.0] Acute renal Hernandez superimposed on chronic kidney disease, unspecified Caroline stage, unspecified acute renal Hernandez type ([place]) [N17.9, N18.9]  DISCHARGE DIAGNOSIS:  Active Problems:   UTI (urinary tract infection)   SECONDARY DIAGNOSIS:   Past Medical History:  Diagnosis Date  . Amputation of left lower extremity below knee ([place])   . Amputation of right lower extremity below knee ([place])   . CAD (coronary artery disease)    a. 2004 Cardiac Arrest/CABG x 3 (LIMA->LAD, VG->OM, VG->RCA);  b. 06/2015 lexiscan MV: no significant ischemia, EF 48%, low risk->Med Rx.  . Chronic combined Caroline and diastolic CHF (congestive heart Hernandez) ([place])    a. 12/2014 Echo: EF 45-50%;  b. 08/2015 Echo: EF 45-50%, ant, antsept HK, mildly dil LA, nl RV, mild-mod TR, sev PAH (62mmHg).  . Caroline (chronic kidney disease), stage IV ([place])   . Hernandez (chronic lymphocytic leukemia) ([place])   . Caroline Hernandez mellitus without complication ([place])   . Essential Caroline Hernandez   . GERD (gastroesophageal reflux disease)   . Hiatal hernia   . History of cardiac arrest    a. 2004.  Marland Kitchen Hyperlipidemia   . Ischemic cardiomyopathy    a. s/p MDT ICD (originally had 8850 lead-->gen change and lead revision ~ 2012 @ [place]); b. 12/2014 Echo: EF 45-50%;  c.08/2015 Echo: EF 45-50%, ant, antsept HK. Nl RV.  Marland Kitchen PAD (peripheral artery disease) (HCC)    a. s/p BKA  . Persistent atrial Hernandez ([place])    a. Dx 12/2014.  CHA2DS2VASc = 6--> warfarin;  b. 09/2015 s/p DCCV-->on amio; c. s/p DCCV 04/25/16     ADMITTING HISTORY  HISTORY OF PRESENT ILLNESS:   Caroline Hernandez  is a 72 y.o. female with a known history of Hernandez, Caroline Hernandez, Caroline Hernandez, Caroline Hernandez, Caroline Hernandez, Caroline Hernandez, Caroline below-knee amputation with prosthesis presents to the emergency room with 1 Hernandez of weakness.  She has also had some soft stools over the last Hernandez 1-2 episodes a day.  No abdominal pain.  Had nausea and one episode of vomiting today.  Afebrile.  She does have Hernandez and has chronic leukocytosis. Here in the emergency room patient has been found to have UTI, sepsis, febrile and is being admitted to the hospitalist service.  HOSPITAL COURSE:   Caroline Hernandez,Caroline Hernandez,Caroline Hernandez, Caroline Hernandez, Caroline Hernandez, Caroline Hernandez, Caroline Hernandez 1-2 episodes a day  *Acute on chronic Caroline Hernandez Patient had decompensated heart Hernandez due to sepsis and also fluid resuscitation given initially for sepsis.  Was given IV Lasix.  Improved.  We will continue oral Lasix at home.  No signs of fluid overload by day of discharge.  *UTI with sepsis present on admission Normal lactic acid. Patient has received 1 L normal saline. No further IV fluids given due to CHF. On IV ceftriaxone during her hospital stay. -UC showed pansensitive E. coli.  Discharged home on Keflex.  *Acute  kidney injury over Caroline Hernandez. Stable  *Caroline Hernandez. Continued home medications  *Caroline Hernandez mellitus. Sliding scale insulin.  Patient was on Lantus and pre-meal insulin at reduced doses.  Resume home regimen after discharge  *Caroline Hernandez. Continue rate control medications.  Pharmacy dosed Coumadin  Patient discharged home with home health in stable condition.    CONSULTS  OBTAINED:    DRUG ALLERGIES:   Allergies  Allergen Reactions  . Zosyn [Piperacillin Sod-Tazobactam So] Other (See Comments)    Renal Hernandez    DISCHARGE MEDICATIONS:   Allergies as of 03/10/2018      Reactions   Zosyn [piperacillin Sod-tazobactam So] Other (See Comments)   Renal Hernandez      Medication List    TAKE these medications   albuterol 108 (90 Base) MCG/ACT inhaler Commonly known as:  PROVENTIL HFA;VENTOLIN HFA Inhale 2 puffs into the lungs every 6 (six) hours as needed for wheezing or shortness of breath.   amitriptyline 25 MG tablet Commonly known as:  ELAVIL Take 25 mg by mouth at bedtime.   amLODipine 10 MG tablet Commonly known as:  NORVASC Take 1 tablet (10 mg total) by mouth daily.   atorvastatin 40 MG tablet Commonly known as:  LIPITOR Take 40 mg by mouth at bedtime.   carvedilol 3.125 MG tablet Commonly known as:  COREG TAKE 1 TABLET BY MOUTH TWICE DAILY   cephALEXin 250 MG capsule Commonly known as:  KEFLEX Take 1 capsule (250 mg total) by mouth every 12 (twelve) hours.   furosemide 40 MG tablet Commonly known as:  LASIX TAKE 1 TABLET(40 MG) BY MOUTH TWICE DAILY   gabapentin 100 MG capsule Commonly known as:  NEURONTIN Take 100 mg by mouth 2 (two) times daily.   HM VITAMIN D3 4000 units Caps Generic drug:  Cholecalciferol Take 4,000 Units by mouth daily.   insulin aspart 100 UNIT/ML injection Commonly known as:  novoLOG Inject 5-10 Units into the skin 3 (three) times daily before meals. 10 units into the skin before breakfast then 5 units before lunch then 10 units before supper (evening meal)   iron polysaccharides 150 MG capsule Commonly known as:  NIFEREX Take 150 mg by mouth daily.   isosorbide mononitrate 30 MG 24 hr tablet Commonly known as:  IMDUR Take 2 tablets (60 mg total) by mouth 2 (two) times daily.   LANTUS 100 UNIT/ML injection Generic drug:  insulin glargine Inject 14-38 Units into the skin 2 (two) times daily  before a meal. 38 units into the skin before breakfast and 14 units before supper (evening meal)   losartan 100 MG tablet Commonly known as:  COZAAR Take 100 mg by mouth every evening.   nitroGLYCERIN 0.4 MG SL tablet Commonly known as:  NITROSTAT Place 1 tablet (0.4 mg total) under the tongue every 5 (five) minutes as needed for chest pain.   pantoprazole 40 MG tablet Commonly known as:  PROTONIX Take 40 mg by mouth at bedtime.   potassium chloride SA 20 MEQ tablet Commonly known as:  K-DUR,KLOR-CON TAKE 1 TABLET BY MOUTH TWICE DAILY   predniSONE 20 MG tablet Commonly known as:  DELTASONE Take 2 tablets (40 mg total) by mouth daily with breakfast.   PREVIDENT 5000 DRY MOUTH 1.1 % Gel dental gel Generic drug:  sodium fluoride Place 1 application onto teeth daily.   sertraline 100 MG tablet Commonly known as:  ZOLOFT Take 100 mg by mouth at bedtime.   vitamin C 500 MG tablet Commonly  known as:  ASCORBIC ACID Take 500 mg by mouth daily.   VITEYES AREDS FORMULA/LUTEIN Caps Take 1 capsule by mouth 2 (two) times daily.   warfarin 4 MG tablet Commonly known as:  COUMADIN Take as directed. If you are unsure how to take this medication, talk to your nurse or doctor. Original instructions:  TAKE AS DIRECTED BY COUMADIN CLINIC       Today   VITAL SIGNS:  Blood pressure (!) 128/59, pulse 81, temperature 98.2 F (36.8 C), temperature source Oral, resp. rate 19, height 5\' 8"  (1.727 m), weight 111.1 kg (244 lb 14.9 oz), SpO2 92 %.  I/O:  No intake or output data in the 24 hours ending 03/21/18 1629  PHYSICAL EXAMINATION:  Physical Exam  GENERAL:  72 y.o.-year-old patient lying in the bed with no acute distress.  LUNGS: Normal breath sounds bilaterally, no wheezing, rales,rhonchi or crepitation. No use of accessory muscles of respiration.  CARDIOVASCULAR: S1, S2 normal. No murmurs, rubs, or gallops.  ABDOMEN: Soft, non-tender, non-distended. Bowel sounds present. No  organomegaly or mass.  NEUROLOGIC: Moves all 4 extremities. PSYCHIATRIC: The patient is alert and oriented x 3.  SKIN: No obvious rash, lesion, or ulcer.   DATA REVIEW:   CBC No results for input(s): WBC, HGB, HCT, PLT in the last 168 hours.  Chemistries  No results for input(s): NA, K, CL, CO2, GLUCOSE, BUN, CREATININE, CALCIUM, MG, AST, ALT, ALKPHOS, BILITOT in the last 168 hours.  Invalid input(s): GFRCGP  Cardiac Enzymes No results for input(s): TROPONINI in the last 168 hours.  Microbiology Results  Results for orders placed or performed during the hospital encounter of 03/07/18  Blood Culture (routine x 2)     Status: None   Collection Time: 03/07/18  7:51 AM  Result Value Ref Range Status   Specimen Description BLOOD BLOOD RIGHT FOREARM  Final   Special Requests   Final    BOTTLES DRAWN AEROBIC AND ANAEROBIC Blood Culture adequate volume   Culture   Final    NO GROWTH 5 DAYS Performed at [facility], [address] [place], [place] [address]    Report Status 03/12/2018 FINAL  Final  Blood Culture (routine x 2)     Status: None   Collection Time: 03/07/18  7:54 AM  Result Value Ref Range Status   Specimen Description BLOOD BLOOD LEFT WRIST  Final   Special Requests   Final    BOTTLES DRAWN AEROBIC AND ANAEROBIC Blood Culture adequate volume   Culture   Final    NO GROWTH 5 DAYS Performed at [facility], [address], [place], [place] [address]    Report Status 03/12/2018 FINAL  Final  Urine culture     Status: Abnormal   Collection Time: 03/07/18  8:51 AM  Result Value Ref Range Status   Specimen Description   Final    URINE, RANDOM Performed at [facility], [address], [place], [place] [address]    Special Requests   Final    NONE Performed at [facility], [address] [place],  [address]    Culture >=100,000 COLONIES/mL ESCHERICHIA COLI (A)  Final   Report Status 03/09/2018 FINAL  Final    Organism ID, Bacteria ESCHERICHIA COLI (A)  Final      Susceptibility   Escherichia coli - MIC*    AMPICILLIN <=2 SENSITIVE Sensitive     CEFAZOLIN <=4 SENSITIVE Sensitive     CEFTRIAXONE <=1 SENSITIVE Sensitive  CIPROFLOXACIN <=0.25 SENSITIVE Sensitive     GENTAMICIN <=1 SENSITIVE Sensitive     IMIPENEM <=0.25 SENSITIVE Sensitive     NITROFURANTOIN <=16 SENSITIVE Sensitive     TRIMETH/SULFA <=20 SENSITIVE Sensitive     AMPICILLIN/SULBACTAM <=2 SENSITIVE Sensitive     PIP/TAZO <=4 SENSITIVE Sensitive     Extended ESBL NEGATIVE Sensitive     * >=100,000 COLONIES/mL ESCHERICHIA COLI  MRSA PCR Screening     Status: None   Collection Time: 03/07/18  8:54 PM  Result Value Ref Range Status   MRSA by PCR NEGATIVE NEGATIVE Final    Comment:        The GeneXpert MRSA Assay (FDA approved for NASAL specimens only), is one component of a comprehensive MRSA colonization surveillance program. It is not intended to diagnose MRSA infection nor to guide or monitor treatment for MRSA infections. Performed at [facility], [address], [place],  [address]     RADIOLOGY:  No results found.  Follow up with PCP in 1 Hernandez.  Management plans discussed with the patient, family and they are in agreement.  CODE STATUS:  Code Status History    Date Active Date Inactive Code Status Order ID Comments User Context   03/07/2018 1058 03/10/2018 1548 DNR [id number]  [name], MD ED   07/18/2016 1718 07/19/2016 1717 Full Code [id number]  Thompson Grayer, MD Inpatient   06/12/2015 1810 06/14/2015 1928 Full Code [id number]  Loletha Grayer, MD ED    Questions for Most Recent Historical Code Status (Order [id number])    Question Answer Comment   In the event of cardiac or respiratory ARREST Do not call a "code blue"    In the event of cardiac or respiratory ARREST Do not perform Intubation, CPR, defibrillation or ACLS    In the event of cardiac or respiratory ARREST Use medication by  any route, position, wound care, and other measures to relive pain and suffering. May use oxygen, suction and manual treatment of airway obstruction as needed for comfort.         Advance Directive Documentation     Most Recent Value  Type of Advance Directive  Healthcare Power of Attorney  Pre-existing out of facility DNR order (yellow form or pink MOST form)  -  "MOST" Form in Place?  -      TOTAL TIME TAKING CARE OF THIS PATIENT ON DAY OF DISCHARGE: more than 30 minutes.   Neita Carp M.D on 03/21/2018 at 4:29 PM  Between 7am to 6pm - Pager - [phone]  After 6pm go to [email or web address] - password [email or web address] [facility]  Office  [phone]  CC: Primary care physician; System, Provider Not In  Note: This dictation was prepared with Dragon dictation along with smaller phrase technology. Any transcriptional errors that result from this process are unintentional.

## 2018-04-03 ENCOUNTER — Ambulatory Visit (INDEPENDENT_AMBULATORY_CARE_PROVIDER_SITE_OTHER): Payer: Medicare HMO

## 2018-04-03 DIAGNOSIS — Z7901 Long term (current) use of anticoagulants: Secondary | ICD-10-CM | POA: Diagnosis not present

## 2018-04-03 DIAGNOSIS — I481 Persistent atrial fibrillation: Secondary | ICD-10-CM | POA: Diagnosis not present

## 2018-04-03 DIAGNOSIS — Z5181 Encounter for therapeutic drug level monitoring: Secondary | ICD-10-CM

## 2018-04-03 DIAGNOSIS — I4819 Other persistent atrial fibrillation: Secondary | ICD-10-CM

## 2018-04-03 LAB — POCT INR: INR: 2.3 (ref 2.0–3.0)

## 2018-04-03 NOTE — Patient Instructions (Signed)
Please continue dosage 6 mg daily except 4mg  on Sundays and Thursdays.   Recheck in 5 weeks.

## 2018-04-14 ENCOUNTER — Encounter: Payer: Self-pay | Admitting: Emergency Medicine

## 2018-04-14 ENCOUNTER — Emergency Department: Payer: Medicare HMO

## 2018-04-14 ENCOUNTER — Inpatient Hospital Stay
Admission: EM | Admit: 2018-04-14 | Discharge: 2018-04-19 | DRG: 871 | Disposition: A | Discharge status: Home / Self Care | Payer: Medicare HMO | Attending: Internal Medicine | Admitting: Internal Medicine

## 2018-04-14 ENCOUNTER — Other Ambulatory Visit: Payer: Self-pay

## 2018-04-14 DIAGNOSIS — N329 Bladder disorder, unspecified: Secondary | ICD-10-CM | POA: Diagnosis present

## 2018-04-14 DIAGNOSIS — E785 Hyperlipidemia, unspecified: Secondary | ICD-10-CM | POA: Diagnosis present

## 2018-04-14 DIAGNOSIS — R809 Proteinuria, unspecified: Secondary | ICD-10-CM | POA: Diagnosis present

## 2018-04-14 DIAGNOSIS — C9110 Chronic lymphocytic leukemia of B-cell type not having achieved remission: Secondary | ICD-10-CM | POA: Diagnosis present

## 2018-04-14 DIAGNOSIS — I5042 Chronic combined systolic (congestive) and diastolic (congestive) heart failure: Secondary | ICD-10-CM | POA: Diagnosis present

## 2018-04-14 DIAGNOSIS — E871 Hypo-osmolality and hyponatremia: Secondary | ICD-10-CM | POA: Diagnosis present

## 2018-04-14 DIAGNOSIS — E1151 Type 2 diabetes mellitus with diabetic peripheral angiopathy without gangrene: Secondary | ICD-10-CM | POA: Diagnosis present

## 2018-04-14 DIAGNOSIS — D631 Anemia in chronic kidney disease: Secondary | ICD-10-CM | POA: Diagnosis present

## 2018-04-14 DIAGNOSIS — Z89512 Acquired absence of left leg below knee: Secondary | ICD-10-CM

## 2018-04-14 DIAGNOSIS — E1122 Type 2 diabetes mellitus with diabetic chronic kidney disease: Secondary | ICD-10-CM | POA: Diagnosis present

## 2018-04-14 DIAGNOSIS — N184 Chronic kidney disease, stage 4 (severe): Secondary | ICD-10-CM | POA: Diagnosis present

## 2018-04-14 DIAGNOSIS — I481 Persistent atrial fibrillation: Secondary | ICD-10-CM | POA: Diagnosis present

## 2018-04-14 DIAGNOSIS — J189 Pneumonia, unspecified organism: Secondary | ICD-10-CM | POA: Diagnosis present

## 2018-04-14 DIAGNOSIS — Z7901 Long term (current) use of anticoagulants: Secondary | ICD-10-CM

## 2018-04-14 DIAGNOSIS — A419 Sepsis, unspecified organism: Secondary | ICD-10-CM | POA: Diagnosis present

## 2018-04-14 DIAGNOSIS — I2510 Atherosclerotic heart disease of native coronary artery without angina pectoris: Secondary | ICD-10-CM | POA: Diagnosis present

## 2018-04-14 DIAGNOSIS — E860 Dehydration: Secondary | ICD-10-CM | POA: Diagnosis present

## 2018-04-14 DIAGNOSIS — Z90710 Acquired absence of both cervix and uterus: Secondary | ICD-10-CM

## 2018-04-14 DIAGNOSIS — A4151 Sepsis due to Escherichia coli [E. coli]: Secondary | ICD-10-CM | POA: Diagnosis present

## 2018-04-14 DIAGNOSIS — Z951 Presence of aortocoronary bypass graft: Secondary | ICD-10-CM

## 2018-04-14 DIAGNOSIS — I130 Hypertensive heart and chronic kidney disease with heart failure and stage 1 through stage 4 chronic kidney disease, or unspecified chronic kidney disease: Secondary | ICD-10-CM | POA: Diagnosis present

## 2018-04-14 DIAGNOSIS — E875 Hyperkalemia: Secondary | ICD-10-CM | POA: Diagnosis present

## 2018-04-14 DIAGNOSIS — Z79899 Other long term (current) drug therapy: Secondary | ICD-10-CM

## 2018-04-14 DIAGNOSIS — N390 Urinary tract infection, site not specified: Secondary | ICD-10-CM | POA: Diagnosis present

## 2018-04-14 DIAGNOSIS — N170 Acute kidney failure with tubular necrosis: Secondary | ICD-10-CM | POA: Diagnosis present

## 2018-04-14 DIAGNOSIS — K219 Gastro-esophageal reflux disease without esophagitis: Secondary | ICD-10-CM | POA: Diagnosis present

## 2018-04-14 DIAGNOSIS — Z89511 Acquired absence of right leg below knee: Secondary | ICD-10-CM

## 2018-04-14 DIAGNOSIS — I255 Ischemic cardiomyopathy: Secondary | ICD-10-CM | POA: Diagnosis present

## 2018-04-14 DIAGNOSIS — Z6832 Body mass index (BMI) 32.0-32.9, adult: Secondary | ICD-10-CM | POA: Diagnosis not present

## 2018-04-14 DIAGNOSIS — R0902 Hypoxemia: Secondary | ICD-10-CM | POA: Diagnosis present

## 2018-04-14 DIAGNOSIS — E1140 Type 2 diabetes mellitus with diabetic neuropathy, unspecified: Secondary | ICD-10-CM | POA: Diagnosis present

## 2018-04-14 DIAGNOSIS — N179 Acute kidney failure, unspecified: Secondary | ICD-10-CM

## 2018-04-14 DIAGNOSIS — Z8674 Personal history of sudden cardiac arrest: Secondary | ICD-10-CM

## 2018-04-14 DIAGNOSIS — Z8249 Family history of ischemic heart disease and other diseases of the circulatory system: Secondary | ICD-10-CM

## 2018-04-14 DIAGNOSIS — Z794 Long term (current) use of insulin: Secondary | ICD-10-CM

## 2018-04-14 DIAGNOSIS — Z880 Allergy status to penicillin: Secondary | ICD-10-CM

## 2018-04-14 DIAGNOSIS — Z833 Family history of diabetes mellitus: Secondary | ICD-10-CM

## 2018-04-14 LAB — URINALYSIS, COMPLETE (UACMP) WITH MICROSCOPIC
BILIRUBIN URINE: NEGATIVE
GLUCOSE, UA: NEGATIVE mg/dL
Ketones, ur: NEGATIVE mg/dL
Nitrite: NEGATIVE
PH: 5 (ref 5.0–8.0)
Protein, ur: 100 mg/dL — AB
SPECIFIC GRAVITY, URINE: 1.012 (ref 1.005–1.030)
Squamous Epithelial / LPF: NONE SEEN (ref 0–5)
WBC, UA: >50 WBC/hpf — ABNORMAL HIGH (ref 0–5)

## 2018-04-14 LAB — CBC WITH DIFFERENTIAL/PLATELET
BASOS PCT: 0 %
Basophils Absolute: 0 10*3/uL (ref 0–0.1)
Eosinophils Absolute: 0 10*3/uL (ref 0–0.7)
Eosinophils Relative: 0 %
HEMATOCRIT: 34 % — AB (ref 35.0–47.0)
Hemoglobin: 10.9 g/dL — ABNORMAL LOW (ref 12.0–16.0)
Lymphocytes Relative: 62 %
Lymphs Abs: 26.3 10*3/uL — ABNORMAL HIGH (ref 1.0–3.6)
MCH: 27.4 pg (ref 26.0–34.0)
MCHC: 32 g/dL (ref 32.0–36.0)
MCV: 85.7 fL (ref 80.0–100.0)
Monocytes Absolute: 0.8 10*3/uL (ref 0.2–0.9)
Monocytes Relative: 2 %
Neutro Abs: 15.3 10*3/uL — ABNORMAL HIGH (ref 1.4–6.5)
Neutrophils Relative %: 36 %
Platelets: 167 10*3/uL (ref 150–440)
RBC: 3.97 MIL/uL (ref 3.80–5.20)
RDW: 17.3 % — ABNORMAL HIGH (ref 11.5–14.5)
WBC: 42.4 10*3/uL — ABNORMAL HIGH (ref 3.6–11.0)

## 2018-04-14 LAB — COMPREHENSIVE METABOLIC PANEL
ALT: 12 U/L (ref 0–44)
AST: 22 U/L (ref 15–41)
Albumin: 3.7 g/dL (ref 3.5–5.0)
Alkaline Phosphatase: 74 U/L (ref 38–126)
Anion gap: 11 (ref 5–15)
BUN: 41 mg/dL — ABNORMAL HIGH (ref 8–23)
CO2: 24 mmol/L (ref 22–32)
Calcium: 8.5 mg/dL — ABNORMAL LOW (ref 8.9–10.3)
Chloride: 102 mmol/L (ref 98–111)
Creatinine, Ser: 3.35 mg/dL — ABNORMAL HIGH (ref 0.44–1.00)
GFR calc Af Amer: 15 mL/min — ABNORMAL LOW (ref >60)
GFR calc non Af Amer: 13 mL/min — ABNORMAL LOW (ref >60)
Glucose, Bld: 119 mg/dL — ABNORMAL HIGH (ref 70–99)
Potassium: 5 mmol/L (ref 3.5–5.1)
Sodium: 137 mmol/L (ref 135–145)
TOTAL PROTEIN: 7.3 g/dL (ref 6.5–8.1)
Total Bilirubin: 1.8 mg/dL — ABNORMAL HIGH (ref 0.3–1.2)

## 2018-04-14 LAB — LACTIC ACID, PLASMA: Lactic Acid, Venous: 2.1 mmol/L (ref 0.5–1.9)

## 2018-04-14 LAB — GLUCOSE, CAPILLARY
Glucose-Capillary: 135 mg/dL — ABNORMAL HIGH (ref 70–99)
Glucose-Capillary: 193 mg/dL — ABNORMAL HIGH (ref 70–99)
Glucose-Capillary: 289 mg/dL — ABNORMAL HIGH (ref 70–99)

## 2018-04-14 LAB — PROTIME-INR
INR: 1.89
Prothrombin Time: 21.5 seconds — ABNORMAL HIGH (ref 11.4–15.2)

## 2018-04-14 MED ORDER — HEPARIN SODIUM (PORCINE) 5000 UNIT/ML IJ SOLN
5000.0000 [IU] | Freq: Three times a day (TID) | INTRAMUSCULAR | Status: DC
  Administered 2018-04-14 – 2018-04-15 (×3): 5000 [IU] via SUBCUTANEOUS
  Filled 2018-04-14 (×3): qty 1

## 2018-04-14 MED ORDER — WARFARIN SODIUM 6 MG PO TABS
6.0000 mg | ORAL_TABLET | Freq: Once | ORAL | Status: AC
  Administered 2018-04-14: 6 mg via ORAL
  Filled 2018-04-14: qty 1

## 2018-04-14 MED ORDER — INSULIN ASPART 100 UNIT/ML ~~LOC~~ SOLN: 5.0000 [IU] | Freq: Three times a day (TID) | SUBCUTANEOUS | Status: DC

## 2018-04-14 MED ORDER — SODIUM CHLORIDE 0.9 % IV SOLN
1.0000 g | INTRAVENOUS | Status: DC
  Filled 2018-04-14: qty 10

## 2018-04-14 MED ORDER — NITROGLYCERIN 0.4 MG SL SUBL: 0.4000 mg | SUBLINGUAL_TABLET | SUBLINGUAL | Status: DC | PRN

## 2018-04-14 MED ORDER — SODIUM CHLORIDE 0.9 % IV SOLN
500.0000 mg | INTRAVENOUS | Status: DC
  Administered 2018-04-14: 500 mg via INTRAVENOUS
  Filled 2018-04-14 (×2): qty 500

## 2018-04-14 MED ORDER — ALUM & MAG HYDROXIDE-SIMETH 200-200-20 MG/5ML PO SUSP
30.0000 mL | ORAL | Status: DC | PRN
  Administered 2018-04-14: 30 mL via ORAL
  Filled 2018-04-14: qty 30

## 2018-04-14 MED ORDER — ATORVASTATIN CALCIUM 20 MG PO TABS
40.0000 mg | ORAL_TABLET | Freq: Every day | ORAL | Status: DC
  Administered 2018-04-14 – 2018-04-18 (×5): 40 mg via ORAL
  Filled 2018-04-14 (×5): qty 2

## 2018-04-14 MED ORDER — INSULIN GLARGINE 100 UNIT/ML ~~LOC~~ SOLN
14.0000 [IU] | Freq: Every day | SUBCUTANEOUS | Status: DC
  Administered 2018-04-14 – 2018-04-18 (×4): 14 [IU] via SUBCUTANEOUS
  Filled 2018-04-14 (×6): qty 0.14

## 2018-04-14 MED ORDER — SODIUM CHLORIDE 0.9 % IV BOLUS
1000.0000 mL | Freq: Once | INTRAVENOUS | Status: AC
  Administered 2018-04-14: 1000 mL via INTRAVENOUS

## 2018-04-14 MED ORDER — GABAPENTIN 100 MG PO CAPS
100.0000 mg | ORAL_CAPSULE | Freq: Two times a day (BID) | ORAL | Status: DC
  Administered 2018-04-14 – 2018-04-19 (×10): 100 mg via ORAL
  Filled 2018-04-14 (×10): qty 1

## 2018-04-14 MED ORDER — INSULIN GLARGINE 100 UNIT/ML ~~LOC~~ SOLN
14.0000 [IU] | Freq: Two times a day (BID) | SUBCUTANEOUS | Status: DC
  Filled 2018-04-14 (×2): qty 0.38

## 2018-04-14 MED ORDER — CARVEDILOL 6.25 MG PO TABS
3.1250 mg | ORAL_TABLET | Freq: Two times a day (BID) | ORAL | Status: DC
  Administered 2018-04-14 – 2018-04-19 (×10): 3.125 mg via ORAL
  Filled 2018-04-14 (×10): qty 1

## 2018-04-14 MED ORDER — INSULIN ASPART 100 UNIT/ML ~~LOC~~ SOLN
0.0000 [IU] | Freq: Three times a day (TID) | SUBCUTANEOUS | Status: DC
  Administered 2018-04-14: 4 [IU] via SUBCUTANEOUS
  Administered 2018-04-15 (×2): 11 [IU] via SUBCUTANEOUS
  Administered 2018-04-15: 4 [IU] via SUBCUTANEOUS
  Administered 2018-04-18: 3 [IU] via SUBCUTANEOUS
  Filled 2018-04-14 (×5): qty 1

## 2018-04-14 MED ORDER — SODIUM CHLORIDE 0.9 % IV SOLN
2.0000 g | Freq: Once | INTRAVENOUS | Status: AC
  Administered 2018-04-14: 2 g via INTRAVENOUS
  Filled 2018-04-14: qty 2

## 2018-04-14 MED ORDER — INSULIN ASPART 100 UNIT/ML ~~LOC~~ SOLN
5.0000 [IU] | Freq: Every day | SUBCUTANEOUS | Status: DC
  Administered 2018-04-15: 5 [IU] via SUBCUTANEOUS
  Filled 2018-04-14: qty 1

## 2018-04-14 MED ORDER — SODIUM FLUORIDE 1.1 % DT GEL: 1.0000 "application " | Freq: Every day | DENTAL | Status: DC

## 2018-04-14 MED ORDER — IPRATROPIUM-ALBUTEROL 0.5-2.5 (3) MG/3ML IN SOLN
3.0000 mL | Freq: Once | RESPIRATORY_TRACT | Status: AC
  Administered 2018-04-14: 3 mL via RESPIRATORY_TRACT
  Filled 2018-04-14: qty 3

## 2018-04-14 MED ORDER — AMLODIPINE BESYLATE 10 MG PO TABS
10.0000 mg | ORAL_TABLET | Freq: Every day | ORAL | Status: DC
  Filled 2018-04-14: qty 1

## 2018-04-14 MED ORDER — INSULIN GLARGINE 100 UNIT/ML ~~LOC~~ SOLN
38.0000 [IU] | Freq: Every day | SUBCUTANEOUS | Status: DC
  Administered 2018-04-15 – 2018-04-16 (×2): 38 [IU] via SUBCUTANEOUS
  Filled 2018-04-14 (×3): qty 0.38

## 2018-04-14 MED ORDER — WARFARIN - PHARMACIST DOSING INPATIENT
Freq: Every day | Status: DC
  Administered 2018-04-15 – 2018-04-18 (×2)

## 2018-04-14 MED ORDER — INSULIN ASPART 100 UNIT/ML ~~LOC~~ SOLN
10.0000 [IU] | Freq: Two times a day (BID) | SUBCUTANEOUS | Status: DC
  Administered 2018-04-14 – 2018-04-16 (×4): 10 [IU] via SUBCUTANEOUS
  Filled 2018-04-14 (×5): qty 1

## 2018-04-14 MED ORDER — VANCOMYCIN HCL IN DEXTROSE 1-5 GM/200ML-% IV SOLN
1000.0000 mg | Freq: Once | INTRAVENOUS | Status: AC
  Administered 2018-04-14: 1000 mg via INTRAVENOUS
  Filled 2018-04-14: qty 200

## 2018-04-14 MED ORDER — VITAMIN D 1000 UNITS PO TABS
4000.0000 [IU] | ORAL_TABLET | Freq: Every day | ORAL | Status: DC
  Administered 2018-04-15 – 2018-04-19 (×5): 4000 [IU] via ORAL
  Filled 2018-04-14 (×7): qty 4

## 2018-04-14 MED ORDER — SERTRALINE HCL 50 MG PO TABS
100.0000 mg | ORAL_TABLET | Freq: Every day | ORAL | Status: DC
  Administered 2018-04-14 – 2018-04-18 (×5): 100 mg via ORAL
  Filled 2018-04-14 (×5): qty 2

## 2018-04-14 MED ORDER — PANTOPRAZOLE SODIUM 40 MG PO TBEC
40.0000 mg | DELAYED_RELEASE_TABLET | Freq: Every day | ORAL | Status: DC
  Administered 2018-04-14 – 2018-04-18 (×5): 40 mg via ORAL
  Filled 2018-04-14 (×5): qty 1

## 2018-04-14 MED ORDER — OCUVITE-LUTEIN PO CAPS
1.0000 | ORAL_CAPSULE | Freq: Two times a day (BID) | ORAL | Status: DC
  Administered 2018-04-14 – 2018-04-19 (×10): 1 via ORAL
  Filled 2018-04-14 (×11): qty 1

## 2018-04-14 MED ORDER — POTASSIUM CHLORIDE CRYS ER 20 MEQ PO TBCR: 20.0000 meq | EXTENDED_RELEASE_TABLET | Freq: Two times a day (BID) | ORAL | Status: DC

## 2018-04-14 MED ORDER — INSULIN ASPART 100 UNIT/ML ~~LOC~~ SOLN
5.0000 [IU] | Freq: Three times a day (TID) | SUBCUTANEOUS | Status: DC
Start: 2018-04-15 — End: 2018-04-14

## 2018-04-14 MED ORDER — ALBUTEROL SULFATE (2.5 MG/3ML) 0.083% IN NEBU: 3.0000 mL | INHALATION_SOLUTION | Freq: Four times a day (QID) | RESPIRATORY_TRACT | Status: DC | PRN

## 2018-04-14 MED ORDER — METHYLPREDNISOLONE SODIUM SUCC 125 MG IJ SOLR
80.0000 mg | INTRAMUSCULAR | Status: AC
  Administered 2018-04-14: 80 mg via INTRAVENOUS
  Filled 2018-04-14: qty 2

## 2018-04-14 MED ORDER — LOSARTAN POTASSIUM 50 MG PO TABS
100.0000 mg | ORAL_TABLET | Freq: Every evening | ORAL | Status: DC
  Administered 2018-04-14: 100 mg via ORAL
  Filled 2018-04-14: qty 2

## 2018-04-14 MED ORDER — VITAMIN C 500 MG PO TABS
500.0000 mg | ORAL_TABLET | Freq: Every day | ORAL | Status: DC
  Administered 2018-04-15 – 2018-04-19 (×5): 500 mg via ORAL
  Filled 2018-04-14 (×5): qty 1

## 2018-04-14 MED ORDER — INSULIN ASPART 100 UNIT/ML ~~LOC~~ SOLN
0.0000 [IU] | Freq: Every day | SUBCUTANEOUS | Status: DC
  Administered 2018-04-14: 3 [IU] via SUBCUTANEOUS
  Filled 2018-04-14: qty 1

## 2018-04-14 MED ORDER — SODIUM CHLORIDE 0.9 % IV SOLN
INTRAVENOUS | Status: DC
  Administered 2018-04-14 – 2018-04-18 (×7): via INTRAVENOUS

## 2018-04-14 MED ORDER — ISOSORBIDE MONONITRATE ER 30 MG PO TB24
60.0000 mg | ORAL_TABLET | Freq: Two times a day (BID) | ORAL | Status: DC
  Administered 2018-04-14: 60 mg via ORAL
  Filled 2018-04-14 (×2): qty 2

## 2018-04-14 MED ORDER — POLYSACCHARIDE IRON COMPLEX 150 MG PO CAPS
150.0000 mg | ORAL_CAPSULE | Freq: Every day | ORAL | Status: DC
  Administered 2018-04-15 – 2018-04-19 (×5): 150 mg via ORAL
  Filled 2018-04-14 (×5): qty 1

## 2018-04-14 MED ORDER — AMITRIPTYLINE HCL 25 MG PO TABS
25.0000 mg | ORAL_TABLET | Freq: Every day | ORAL | Status: DC
  Administered 2018-04-14 – 2018-04-18 (×5): 25 mg via ORAL
  Filled 2018-04-14 (×6): qty 1

## 2018-04-14 MED ORDER — WARFARIN SODIUM 5 MG PO TABS: 5.0000 mg | ORAL_TABLET | Freq: Every day | ORAL | Status: DC

## 2018-04-14 MED ORDER — INSULIN GLARGINE 100 UNIT/ML ~~LOC~~ SOLN: 14.0000 [IU] | Freq: Two times a day (BID) | SUBCUTANEOUS | Status: DC

## 2018-04-14 NOTE — ED Triage Notes (Signed)
Pt presents to ED via [facility] from home c/o fever, tachycardia, and hypoxia. EMS report O2 sat 86% on RA, increased to 92-92% on 6L via n/c. Does not use O2 at home. T101, pt took tylenol 1 hr PTA. Pt states she was treated for UTI in June and does not think it fully resolved. Hx leukemia.

## 2018-04-14 NOTE — Progress Notes (Signed)
ANTICOAGULATION CONSULT NOTE - Initial Consult  Pharmacy Consult for warfarin Indication: atrial fibrillation  Allergies  Allergen Reactions  . Zosyn [Piperacillin Sod-Tazobactam So] Other (See Comments)    Renal failure    Patient Measurements: Height: 5\' 8"  (172.7 cm) Weight: 211 lb (95.7 kg) IBW/kg (Calculated) : 63.9   Vital Signs: Temp: 99.1 F (37.3 C) (08/04 1349) Temp Source: Oral (08/04 1349) BP: 136/78 (08/04 1320) Pulse Rate: 70 (08/04 1636)  Labs: Recent Labs    04/14/18 1343  HGB 10.9*  HCT 34.0*  PLT 167  LABPROT 21.5*  INR 1.89  CREATININE 3.35*    Estimated Creatinine Clearance: 18.9 mL/min (A) (by C-G formula based on SCr of 3.35 mg/dL (H)).   Medical History: Past Medical History:  Diagnosis Date  . Amputation of left lower extremity below knee ([place])   . Amputation of right lower extremity below knee ([place])   . CAD (coronary artery disease)    a. 2004 Cardiac Arrest/CABG x 3 (LIMA->LAD, VG->OM, VG->RCA);  b. 06/2015 lexiscan MV: no significant ischemia, EF 48%, low risk->Med Rx.  . Chronic combined systolic and diastolic CHF (congestive heart failure) ([place])    a. 12/2014 Echo: EF 45-50%;  b. 08/2015 Echo: EF 45-50%, ant, antsept HK, mildly dil LA, nl RV, mild-mod TR, sev PAH (17mmHg).  . CKD (chronic kidney disease), stage IV ([place])   . CLL (chronic lymphocytic leukemia) ([place])   . Diabetes mellitus without complication ([place])   . Essential hypertension   . GERD (gastroesophageal reflux disease)   . Hiatal hernia   . History of cardiac arrest    a. 2004.  Marland Kitchen Hyperlipidemia   . Ischemic cardiomyopathy    a. s/p MDT ICD (originally had 2542 lead-->gen change and lead revision ~ 2012 @ [place]); b. 12/2014 Echo: EF 45-50%;  c.08/2015 Echo: EF 45-50%, ant, antsept HK. Nl RV.  Marland Kitchen PAD (peripheral artery disease) (HCC)    a. s/p BKA  . Persistent atrial fibrillation ([place])    a. Dx 12/2014.  CHA2DS2VASc = 6--> warfarin;  b. 09/2015 s/p DCCV-->on amio; c. s/p  DCCV 04/25/16    Assessment: 72 year old female admitted for CAP. Patient takes warfarin for afib. Pharmacy has been consulted for warfarin dosing. The patient's home dose is 6 mg on Mon, Tues, Thurs, Fri and Sat and 4 mg on Wed and Sun.  Goal of Therapy:  INR 2-3 Monitor platelets by anticoagulation protocol: Yes   Plan:  INR today subtherapeutic at 1.89. Will give patient a one-time dose of 6 mg and recheck INR in am. Plan is to restart home regimen tomorrow.  Ellery Meroney K Niraj Kudrna 04/14/2018,6:11 PM

## 2018-04-14 NOTE — Progress Notes (Signed)
Pharmacy Antibiotic Note  Caroline Hernandez is a 72 y.o. female admitted on 04/14/2018 with sepsis.  Pharmacy has been consulted for cefepime and vancomycinj dosing.  Plan: 1. Cefepime 2 gm IV x 1 in ED  2. Vancomycin 1 gm IV x 1 in ED  Patient to receive once orders of the above antibiotics in ED then transition azithromycin and ceftriaxone according to admitting prescriber's orders.  Height: 5\' 8"  (172.7 cm) Weight: 211 lb (95.7 kg) IBW/kg (Calculated) : 63.9  Temp (24hrs), Avg:99.1 F (37.3 C), Min:99.1 F (37.3 C), Max:99.1 F (37.3 C)  Recent Labs  Lab 04/14/18 1343 04/14/18 1344  WBC 42.4*  --   CREATININE 3.35*  --   LATICACIDVEN  --  2.1*    Estimated Creatinine Clearance: 18.9 mL/min (A) (by C-G formula based on SCr of 3.35 mg/dL (H)).    Allergies  Allergen Reactions  . Zosyn [Piperacillin Sod-Tazobactam So] Other (See Comments)    Renal failure    Antimicrobials this admission:  Dose adjustments this admission:   Microbiology results:  BCx:   UCx:    Sputum:    MRSA PCR:   Thank you for allowing pharmacy to be a part of this patient's care.  Laural Benes, Pharm.D., BCPS Clinical Pharmacist 04/14/2018 3:23 PM

## 2018-04-14 NOTE — ED Notes (Signed)
Tried to call report to the floor. Will try back in 5 minutes

## 2018-04-14 NOTE — ED Provider Notes (Signed)
[facility] Emergency Department Provider Note   ____________________________________________   First MD Initiated Contact with Patient 04/14/18 1326     (approximate)  I have reviewed the triage vital signs and the nursing notes.   HISTORY  Chief Complaint Code Sepsis  Cough fever and shortness of breath  HPI Caroline Hernandez is a 72 y.o. female here for evaluation as she is began developing fevers yesterday, cough and shortness of breath.  She reports that she feels like she is developing possibly pneumonia.  She did have some wheezing earlier in the day without seems to be better now.  She started feeling better after oxygen was started.  Patient reports that she had a fever to 101, cough very productive with increasing shortness of breath over the last day.    No abdominal pain or vomiting.  No rash.  No urinary symptoms, but reports she has had urinary infection in the past.   Past Medical History:  Diagnosis Date  . Amputation of left lower extremity below knee ([name])   . Amputation of right lower extremity below knee ([name])   . CAD (coronary artery disease)    a. 2004 Cardiac Arrest/CABG x 3 (LIMA->LAD, VG->OM, VG->RCA);  b. 06/2015 lexiscan MV: no significant ischemia, EF 48%, low risk->Med Rx.  . Chronic combined systolic and diastolic CHF (congestive heart failure) ([place])    a. 12/2014 Echo: EF 45-50%;  b. 08/2015 Echo: EF 45-50%, ant, antsept HK, mildly dil LA, nl RV, mild-mod TR, sev PAH (50mmHg).  . CKD (chronic kidney disease), stage IV ([place])   . CLL (chronic lymphocytic leukemia) ([place])   . Diabetes mellitus without complication ([place])   . Essential hypertension   . GERD (gastroesophageal reflux disease)   . Hiatal hernia   . History of cardiac arrest    a. 2004.  Marland Kitchen Hyperlipidemia   . Ischemic cardiomyopathy    a. s/p MDT ICD (originally had 6599 lead-->gen change and lead revision ~ 2012 @ [facility]); b. 12/2014 Echo: EF 45-50%;  c.08/2015 Echo:  EF 45-50%, ant, antsept HK. Nl RV.  Marland Kitchen PAD (peripheral artery disease) ([facility])    a. s/p BKA  . Persistent atrial fibrillation ([place])    a. Dx 12/2014.  CHA2DS2VASc = 6--> warfarin;  b. 09/2015 s/p DCCV-->on amio; c. s/p DCCV 04/25/16    Patient Active Problem List   Diagnosis Date Noted  . CAP (community acquired pneumonia) 04/14/2018  . UTI (urinary tract infection) 03/07/2018  . A-fib ([place]) 07/18/2016  . Chronic diastolic CHF (congestive heart failure) ([place])   . SOB (shortness of breath)   . Chronic systolic CHF (congestive heart failure) ([place]) 08/20/2015  . CKD (chronic kidney disease), stage IV ([place])   . Ischemic cardiomyopathy   . Chronic combined systolic and diastolic CHF (congestive heart failure) ([place])   . Long term (current) use of anticoagulants [Z79.01] 06/23/2015  . Essential hypertension 06/18/2015  . Systolic and diastolic CHF, acute on chronic ([place])   . Acute on chronic renal failure ([place])   . Angina pectoris ([place])   . Coronary artery disease involving native coronary artery of native heart with angina pectoris with documented spasm ([place])   . 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 Persistent atrial fibrillation ([place])   . Bradycardia   . Chest pain 06/12/2015    Past Surgical History:  Procedure Laterality Date  . ABDOMINAL HYSTERECTOMY    . Amputation lower extremity bilaterally Bilateral   .  CARDIAC CATHETERIZATION    . CARDIOVERSION N/A 10/09/2016   Procedure: CARDIOVERSION;  Surgeon: Lelon Perla, MD;  [facility];  Service: Cardiovascular;  Laterality: N/A;  . CORONARY ARTERY BYPASS GRAFT    . ELECTROPHYSIOLOGIC STUDY N/A 10/07/2015   Procedure: CARDIOVERSION;  Surgeon: Minna Merritts, MD;  Location: [facility] ORS;  Service: Cardiovascular;  Laterality: N/A;  . ELECTROPHYSIOLOGIC STUDY N/A 04/25/2016   Procedure: Cardioversion;  Surgeon: Minna Merritts, MD;  Location: [facility] ORS;  Service: Cardiovascular;  Laterality:  N/A;  . ELECTROPHYSIOLOGIC STUDY N/A 07/18/2016   Procedure: Atrial Fibrillation Ablation;  Surgeon: Thompson Grayer, MD;  Location: [facility] LAB;  Service: Cardiovascular;  Laterality: N/A;  . IMPLANTABLE CARDIOVERTER DEFIBRILLATOR IMPLANT  2005   [employer]   . TEE WITHOUT CARDIOVERSION N/A 07/18/2016   Procedure: TRANSESOPHAGEAL ECHOCARDIOGRAM (TEE);  Surgeon: Sueanne Margarita, MD;  Location: [facility] ENDOSCOPY;  Service: Cardiovascular;  Laterality: N/A;  . TEE WITHOUT CARDIOVERSION N/A 10/09/2016   Procedure: TRANSESOPHAGEAL ECHOCARDIOGRAM (TEE);  Surgeon: Lelon Perla, MD;  Location: [facility] ENDOSCOPY;  Service: Cardiovascular;  Laterality: N/A;    Prior to Admission medications   Medication Sig Start Date End Date Taking? Authorizing Provider  albuterol (PROVENTIL HFA;VENTOLIN HFA) 108 (90 Base) MCG/ACT inhaler Inhale 2 puffs into the lungs every 6 (six) hours as needed for wheezing or shortness of breath. 03/10/18  Yes Sudini, Alveta Heimlich, MD  amitriptyline (ELAVIL) 25 MG tablet Take 25 mg by mouth at bedtime.    Yes [provider]  amLODipine (NORVASC) 10 MG tablet Take 1 tablet (10 mg total) by mouth daily. 08/24/17  Yes Minna Merritts, MD  atorvastatin (LIPITOR) 40 MG tablet Take 40 mg by mouth at bedtime.    Yes [provider]  carvedilol (COREG) 3.125 MG tablet TAKE 1 TABLET BY MOUTH TWICE DAILY 01/21/18  Yes Gollan, Kathlene November, MD  Cholecalciferol (HM VITAMIN D3) 4000 UNITS CAPS Take 4,000 Units by mouth daily.   Yes [provider]  furosemide (LASIX) 40 MG tablet TAKE 1 TABLET(40 MG) BY MOUTH TWICE DAILY 01/21/18  Yes Gollan, Kathlene November, MD  gabapentin (NEURONTIN) 100 MG capsule Take 100 mg by mouth 2 (two) times daily.   Yes [provider]  insulin aspart (NOVOLOG) 100 UNIT/ML injection Inject 5-10 Units into the skin 3 (three) times daily before meals. 10 units into the skin before breakfast then 5 units before lunch then 10 units before supper (evening  meal)   Yes [provider]  insulin glargine (LANTUS) 100 UNIT/ML injection Inject 14-38 Units into the skin 2 (two) times daily before a meal. 38 units into the skin before breakfast and 14 units before supper (evening meal)   Yes [provider]  iron polysaccharides (NIFEREX) 150 MG capsule Take 150 mg by mouth daily.   Yes [provider]  isosorbide mononitrate (IMDUR) 30 MG 24 hr tablet Take 2 tablets (60 mg total) by mouth 2 (two) times daily. Patient taking differently: Take 60 mg by mouth 2 (two) times daily. Take 60 mg by mouth in the morning and 30 mg by mouth at night. 10/31/17  Yes Gollan, Kathlene November, MD  losartan (COZAAR) 100 MG tablet Take 100 mg by mouth every evening.  06/02/15  Yes [provider]  Multiple Vitamins-Minerals (VITEYES AREDS FORMULA/LUTEIN) CAPS Take 1 capsule by mouth 2 (two) times daily.   Yes [provider]  nitroGLYCERIN (NITROSTAT) 0.4 MG SL tablet Place 1 tablet (0.4 mg total) under  the tongue every 5 (five) minutes as needed for chest pain. 06/14/15  Yes Mody, Ulice Bold, MD  pantoprazole (PROTONIX) 40 MG tablet Take 40 mg by mouth at bedtime. 06/19/16  Yes [provider]  potassium chloride SA (K-DUR,KLOR-CON) 20 MEQ tablet TAKE 1 TABLET BY MOUTH TWICE DAILY Patient taking differently: TAKE 1 TABLET BY MOUTH ONCE DAILY 02/05/18  Yes Gollan, Kathlene November, MD  PREVIDENT 5000 DRY MOUTH 1.1 % GEL dental gel Place 1 application onto teeth daily.    Yes [provider]  sertraline (ZOLOFT) 100 MG tablet Take 100 mg by mouth at bedtime.  06/02/15  Yes [provider]  vitamin C (ASCORBIC ACID) 500 MG tablet Take 500 mg by mouth daily.   Yes [provider]  warfarin (COUMADIN) 4 MG tablet TAKE AS DIRECTED BY COUMADIN CLINIC 02/12/18  Yes Gollan, Kathlene November, MD    Allergies Zosyn [piperacillin sod-tazobactam so]  Family History  Problem Relation Age of Onset  . CAD Mother   . Diabetes Mother   .  Atrial fibrillation Mother   . Lung cancer Father   . Diabetes Father     Social History Social History   Tobacco Use  . Smoking status: Never Smoker  . Smokeless tobacco: Never Used  Substance Use Topics  . Alcohol use: No  . Drug use: No    Review of Systems Constitutional: Fevers and chills Eyes: No visual changes. ENT: No sore throat. Cardiovascular: Denies chest pain. Respiratory: Shortness of breath and productive cough. Gastrointestinal: No abdominal pain.  No nausea, no vomiting.  No diarrhea.  No constipation. Genitourinary: Negative for dysuria. Musculoskeletal: Negative for back pain.  Chronic lower extremity amputations. Skin: Negative for rash. Neurological: Negative for headaches but feels fatigued.    ____________________________________________   PHYSICAL EXAM:  VITAL SIGNS: ED Triage Vitals  Enc Vitals Group     BP 04/14/18 1320 136/78     Pulse --      Resp --      Temp 04/14/18 1349 99.1 F (37.3 C)     Temp Source 04/14/18 1349 Oral     SpO2 04/14/18 1320 (!) 86 %     Weight 04/14/18 1320 211 lb (95.7 kg)     Height 04/14/18 1320 5\' 8"  (1.727 m)     Head Circumference --      Peak Flow --      Pain Score 04/14/18 1320 0     Pain Loc --      Pain Edu? --      Excl. in [place]? --     Constitutional: Alert and oriented.  Moderately ill-appearing but pleasant and in no distress. Eyes: Conjunctivae are normal. Head: Atraumatic. Nose: No congestion/rhinnorhea. Mouth/Throat: Mucous membranes are moist. Neck: No stridor.   Cardiovascular: Normal rate, regular rhythm. Grossly normal heart sounds.  Good peripheral circulation. Respiratory: Diminished lung sounds in the bases bilaterally.  Question of some very scant wheezing or prolonged expiratory phase bilateral.  No frank crackles are noted. Gastrointestinal: Soft and nontender. No distention. Musculoskeletal: No lower extremity tenderness with previous lower extremity amputations to noted  bilateral. Neurologic:  Normal speech and language. No gross focal neurologic deficits are appreciated.  Skin:  Skin is warm, she is slightly sweaty and intact. No rash noted. Psychiatric: Mood and affect are normal. Speech and behavior are normal.  ____________________________________________   LABS (all labs ordered are listed, but only abnormal results are displayed)  Labs Reviewed  COMPREHENSIVE METABOLIC PANEL -  Abnormal; Notable for the following components:      Result Value   Glucose, Bld 119 (*)    BUN 41 (*)    Creatinine, Ser 3.35 (*)    Calcium 8.5 (*)    Total Bilirubin 1.8 (*)    GFR calc non Af Amer 13 (*)    GFR calc Af Amer 15 (*)    All other components within normal limits  LACTIC ACID, PLASMA - Abnormal; Notable for the following components:   Lactic Acid, Venous 2.1 (*)    All other components within normal limits  CBC WITH DIFFERENTIAL/PLATELET - Abnormal; Notable for the following components:   WBC 42.4 (*)    Hemoglobin 10.9 (*)    HCT 34.0 (*)    RDW 17.3 (*)    Neutro Abs 15.3 (*)    Lymphs Abs 26.3 (*)    All other components within normal limits  PROTIME-INR - Abnormal; Notable for the following components:   Prothrombin Time 21.5 (*)    All other components within normal limits  URINALYSIS, COMPLETE (UACMP) WITH MICROSCOPIC - Abnormal; Notable for the following components:   Color, Urine YELLOW (*)    APPearance TURBID (*)    Hgb urine dipstick SMALL (*)    Protein, ur 100 (*)    Leukocytes, UA LARGE (*)    WBC, UA >50 (*)    Bacteria, UA MANY (*)    All other components within normal limits  GLUCOSE, CAPILLARY - Abnormal; Notable for the following components:   Glucose-Capillary 135 (*)    All other components within normal limits  CULTURE, BLOOD (ROUTINE X 2)  CULTURE, BLOOD (ROUTINE X 2)  LACTIC ACID, PLASMA  ____________________________________________  RADIOLOGY   Chest x-ray reviewed by me Lower lobe opacities  noted ____________________________________________   PROCEDURES  Procedure(s) performed: None  Procedures  Critical Care performed: Yes, see critical care note(s)  CRITICAL CARE Performed by: Delman Kitten   Total critical care time: 35 minutes  Critical care time was exclusive of separately billable procedures and treating other patients.  Critical care was necessary to treat or prevent imminent or life-threatening deterioration.  Critical care was time spent personally by me on the following activities: development of treatment plan with patient and/or surrogate as well as nursing, discussions with consultants, evaluation of patient's response to treatment, examination of patient, obtaining history from patient or surrogate, ordering and performing treatments and interventions, ordering and review of laboratory studies, ordering and review of radiographic studies, pulse oximetry and re-evaluation of patient's condition.  ____________________________________________   INITIAL IMPRESSION / ASSESSMENT AND PLAN / ED COURSE  Pertinent labs & imaging results that were available during my care of the patient were reviewed by me and considered in my medical decision making (see chart for details).  Patient presents for evaluation of shortness of breath, fever, notable leukocytosis that does have underlying hematologic disorder, and shortness of breath.  Chest x-ray demonstrates what appears to be likely bilateral lower lobe pneumonias.  Fever at home.  No chest pain or cardiac symptoms.  Lactate mildly elevated.  Will hydrate.  Presentation appears most consistent with pneumonia, hypoxia corrects and patient reports her symptoms are much improved on 2 L with oxygen saturation in the mid 90s.  ----------------------------------------- 4:14 PM on 04/14/2018 -----------------------------------------  Patient understanding of plan for admission.  Resting comfortably at this time.  No  concerns reports she feels better after some fluids with oxygen therapy.  Discussed case with admitting doctor Dr.  Salary.      ____________________________________________   FINAL CLINICAL IMPRESSION(S) / ED DIAGNOSES  Final diagnoses:  Community acquired pneumonia, unspecified laterality  Sepsis, due to unspecified organism ([place])      NEW MEDICATIONS STARTED DURING THIS VISIT:  New Prescriptions   No medications on file     Note:  This document was prepared using Dragon voice recognition software and may include unintentional dictation errors.     Delman Kitten, MD 04/14/18 [phone]

## 2018-04-14 NOTE — ED Notes (Signed)
Pt is AOx4, in bed with rails upx2, on 2L O2 via [place]. She diaphoretic but denies all pain, chest pain, dizziness, N/V/D at this time. She c/o SOB with fever at home. Family is at the bedside. We will continue to monitor the pt.

## 2018-04-14 NOTE — Progress Notes (Signed)
Family Meeting Note  Advance Directive:yes  Today a meeting took place with the Patient.  Patient is able to participate   The following clinical team members were present during this meeting:MD  The following were discussed:Patient's diagnosis:cll, cap , Patient's progosis: Unable to determine and Goals for treatment: Full Code  Additional follow-up to be provided: prn  Time spent during discussion:20 minutes  Gorden Harms, MD

## 2018-04-14 NOTE — H&P (Signed)
[facility] [place] NAME: Caroline Hernandez    MR#:  [id number]  DATE OF BIRTH:  [birth year]  DATE OF ADMISSION:  04/14/2018  PRIMARY CARE PHYSICIAN: System, Provider Not In   REQUESTING/REFERRING PHYSICIAN:   CHIEF COMPLAINT:   Chief Complaint  Patient presents with  . Code Sepsis    HISTORY OF PRESENT ILLNESS: Caroline Hernandez  is a 72 y.o. female with a known history per below presented to the emergency room with 1 day history of fevers, shortness of breath, productive cough, in the emergency room patient was found to have hypoxia with O2 saturation this is mild of 86% on room air, in the emergency room patient was found to have bilateral pneumonia on chest x-ray, INR 1.8, [place] count 42,000, lactic acid 2.1, creatinine 3.3 with baseline around 2.5, patient evaluated in the emergency room, no apparent distress, resting comfortably in bed, patient is now being admitted for acute community-acquired pneumonia compounded by immunosuppression from chronic lymphocytic leukemia, acute kidney injury with chronic kidney disease stage IV most likely due to dehydration.  PAST MEDICAL HISTORY:   Past Medical History:  Diagnosis Date  . Amputation of left lower extremity below knee ([place])   . Amputation of right lower extremity below knee ([place])   . CAD (coronary artery disease)    a. 2004 Cardiac Arrest/CABG x 3 (LIMA->LAD, VG->OM, VG->RCA);  b. 06/2015 lexiscan MV: no significant ischemia, EF 48%, low risk->Med Rx.  . Chronic combined systolic and diastolic CHF (congestive heart failure) ([place])    a. 12/2014 Echo: EF 45-50%;  b. 08/2015 Echo: EF 45-50%, ant, antsept HK, mildly dil LA, nl RV, mild-mod TR, sev PAH (65mmHg).  . CKD (chronic kidney disease), stage IV ([place])   . CLL (chronic lymphocytic leukemia) ([place])   . Diabetes mellitus without complication ([place])   . Essential hypertension   . GERD (gastroesophageal reflux disease)   . Hiatal hernia   . History  of cardiac arrest    a. 2004.  Marland Kitchen Hyperlipidemia   . Ischemic cardiomyopathy    a. s/p MDT ICD (originally had 2423 lead-->gen change and lead revision ~ 2012 @ [facility]); b. 12/2014 Echo: EF 45-50%;  c.08/2015 Echo: EF 45-50%, ant, antsept HK. Nl RV.  Marland Kitchen PAD (peripheral artery disease) (H[facility])    a. s/p BKA  . Persistent atrial fibrillation ([place])    a. Dx 12/2014.  CHA2DS2VASc = 6--> warfarin;  b. 09/2015 s/p DCCV-->on amio; c. s/p DCCV 04/25/16    PAST SURGICAL HISTORY:  Past Surgical History:  Procedure Laterality Date  . ABDOMINAL HYSTERECTOMY    . Amputation lower extremity bilaterally Bilateral   . CARDIAC CATHETERIZATION    . CARDIOVERSION N/A 10/09/2016   Procedure: CARDIOVERSION;  Surgeon: Lelon Perla, MD;  Location: [facility] ENDOSCOPY;  Service: Cardiovascular;  Laterality: N/A;  . CORONARY ARTERY BYPASS GRAFT    . ELECTROPHYSIOLOGIC STUDY N/A 10/07/2015   Procedure: CARDIOVERSION;  Surgeon: Minna Merritts, MD;  Location: [facility] ORS;  Service: Cardiovascular;  Laterality: N/A;  . ELECTROPHYSIOLOGIC STUDY N/A 04/25/2016   Procedure: Cardioversion;  Surgeon: Minna Merritts, MD;  Location: [facility] ORS;  Service: Cardiovascular;  Laterality: N/A;  . ELECTROPHYSIOLOGIC STUDY N/A 07/18/2016   Procedure: Atrial Fibrillation Ablation;  Surgeon: Thompson Grayer, MD;  Location: [facility] CV LAB;  Service: Cardiovascular;  Laterality: N/A;  . IMPLANTABLE CARDIOVERTER DEFIBRILLATOR IMPLANT  2005   [employer]   . TEE WITHOUT CARDIOVERSION N/A 07/18/2016   Procedure:  TRANSESOPHAGEAL ECHOCARDIOGRAM (TEE);  Surgeon: Sueanne Margarita, MD;  Location: [facility] ENDOSCOPY;  Service: Cardiovascular;  Laterality: N/A;  . TEE WITHOUT CARDIOVERSION N/A 10/09/2016   Procedure: TRANSESOPHAGEAL ECHOCARDIOGRAM (TEE);  Surgeon: Lelon Perla, MD;  Location: [facility] ENDOSCOPY;  Service: Cardiovascular;  Laterality: N/A;    SOCIAL HISTORY:  Social History   Tobacco Use  . Smoking status: Never Smoker  . Smokeless tobacco: Never  Used  Substance Use Topics  . Alcohol use: No    FAMILY HISTORY:  Family History  Problem Relation Age of Onset  . CAD Mother   . Diabetes Mother   . Atrial fibrillation Mother   . Lung cancer Father   . Diabetes Father     DRUG ALLERGIES:  Allergies  Allergen Reactions  . Zosyn [Piperacillin Sod-Tazobactam So] Other (See Comments)    Renal failure    REVIEW OF SYSTEMS:   CONSTITUTIONAL: +No fever, fatigue, weakness.  EYES: No blurred or double vision.  EARS, NOSE, AND THROAT: No tinnitus or ear pain.  RESPIRATORY: + cough, shortness of breath, no wheezing or hemoptysis.  CARDIOVASCULAR: No chest pain, orthopnea, edema.  GASTROINTESTINAL: No nausea, vomiting, diarrhea or abdominal pain.  GENITOURINARY: No dysuria, hematuria.  ENDOCRINE: No polyuria, nocturia,  HEMATOLOGY: No anemia, easy bruising or bleeding SKIN: No rash or lesion. MUSCULOSKELETAL: No joint pain or arthritis.   NEUROLOGIC: No tingling, numbness, weakness.  PSYCHIATRY: No anxiety or depression.   MEDICATIONS AT HOME:  Prior to Admission medications   Medication Sig Start Date End Date Taking? Authorizing Provider  albuterol (PROVENTIL HFA;VENTOLIN HFA) 108 (90 Base) MCG/ACT inhaler Inhale 2 puffs into the lungs every 6 (six) hours as needed for wheezing or shortness of breath. 03/10/18   [name], MD  amitriptyline (ELAVIL) 25 MG tablet Take 25 mg by mouth at bedtime.     [provider]  amLODipine (NORVASC) 10 MG tablet Take 1 tablet (10 mg total) by mouth daily. 08/24/17   Minna Merritts, MD  atorvastatin (LIPITOR) 40 MG tablet Take 40 mg by mouth at bedtime.     [provider]  carvedilol (COREG) 3.125 MG tablet TAKE 1 TABLET BY MOUTH TWICE DAILY 01/21/18   Minna Merritts, MD  Cholecalciferol (HM VITAMIN D3) 4000 UNITS CAPS Take 4,000 Units by mouth daily.    [provider]  furosemide (LASIX) 40 MG tablet TAKE 1 TABLET(40 MG) BY MOUTH TWICE DAILY 01/21/18    Minna Merritts, MD  gabapentin (NEURONTIN) 100 MG capsule Take 100 mg by mouth 2 (two) times daily.    [provider]  insulin aspart (NOVOLOG) 100 UNIT/ML injection Inject 5-10 Units into the skin 3 (three) times daily before meals. 10 units into the skin before breakfast then 5 units before lunch then 10 units before supper (evening meal)    [provider]  insulin glargine (LANTUS) 100 UNIT/ML injection Inject 14-38 Units into the skin 2 (two) times daily before a meal. 38 units into the skin before breakfast and 14 units before supper (evening meal)    [provider]  iron polysaccharides (NIFEREX) 150 MG capsule Take 150 mg by mouth daily.    [provider]  isosorbide mononitrate (IMDUR) 30 MG 24 hr tablet Take 2 tablets (60 mg total) by mouth 2 (two) times daily. 10/31/17   Minna Merritts, MD  losartan (COZAAR) 100 MG tablet Take 100 mg by mouth every evening.  06/02/15   [provider]  Multiple Vitamins-Minerals (  VITEYES AREDS FORMULA/LUTEIN) CAPS Take 1 capsule by mouth 2 (two) times daily.    [provider]  nitroGLYCERIN (NITROSTAT) 0.4 MG SL tablet Place 1 tablet (0.4 mg total) under the tongue every 5 (five) minutes as needed for chest pain. 06/14/15   Bettey Costa, MD  pantoprazole (PROTONIX) 40 MG tablet Take 40 mg by mouth at bedtime. 06/19/16   [provider]  potassium chloride SA (K-DUR,KLOR-CON) 20 MEQ tablet TAKE 1 TABLET BY MOUTH TWICE DAILY 02/05/18   Minna Merritts, MD  PREVIDENT 5000 DRY MOUTH 1.1 % GEL dental gel Place 1 application onto teeth daily.     [provider]  sertraline (ZOLOFT) 100 MG tablet Take 100 mg by mouth at bedtime.  06/02/15   [provider]  vitamin C (ASCORBIC ACID) 500 MG tablet Take 500 mg by mouth daily.    [provider]  warfarin (COUMADIN) 4 MG tablet TAKE AS DIRECTED BY COUMADIN CLINIC 02/12/18   Minna Merritts, MD      PHYSICAL EXAMINATION:    VITAL SIGNS: Blood pressure 136/78, temperature 99.1 F (37.3 C), temperature source Oral, height 5\' 8"  (1.727 m), weight 95.7 kg (211 lb), SpO2 (!) 86 %.  GENERAL:  72 y.o.-year-old patient lying in the bed with no acute distress.  Morbidly obese EYES: Pupils equal, round, reactive to light and accommodation. No scleral icterus. Extraocular muscles intact.  HEENT: Head atraumatic, normocephalic. Oropharynx and nasopharynx clear.  NECK:  Supple, no jugular venous distention. No thyroid enlargement, no tenderness.  LUNGS: Rales up to mid back bilaterally. No use of accessory muscles of respiration.  CARDIOVASCULAR: S1, S2 normal. No murmurs, rubs, or gallops.  ABDOMEN: Soft, nontender, nondistended. Bowel sounds present. No organomegaly or mass.  EXTREMITIES: No pedal edema, cyanosis, or clubbing.  Bilateral BKA's NEUROLOGIC: Cranial nerves II through XII are intact. MAES. Gait not checked.  PSYCHIATRIC: The patient is alert and oriented x 3.  SKIN: No obvious rash, lesion, or ulcer.   LABORATORY PANEL:   CBC Recent Labs  Lab 04/14/18 1343  WBC 42.4*  HGB 10.9*  HCT 34.0*  PLT 167  MCV 85.7  MCH 27.4  MCHC 32.0  RDW 17.3*  LYMPHSABS 26.3*  MONOABS 0.8  EOSABS 0.0  BASOSABS 0.0   ------------------------------------------------------------------------------------------------------------------  Chemistries  Recent Labs  Lab 04/14/18 1343  NA 137  K 5.0  CL 102  CO2 24  GLUCOSE 119*  BUN 41*  CREATININE 3.35*  CALCIUM 8.5*  AST 22  ALT 12  ALKPHOS 74  BILITOT 1.8*   ------------------------------------------------------------------------------------------------------------------ estimated creatinine clearance is 18.9 mL/min (A) (by C-G formula based on SCr of 3.35 mg/dL (H)). ------------------------------------------------------------------------------------------------------------------ No results for input(s): TSH, T4TOTAL, T3FREE, THYROIDAB in the last 72  hours.  Invalid input(s): FREET3   Coagulation profile Recent Labs  Lab 04/14/18 1343  INR 1.89   ------------------------------------------------------------------------------------------------------------------- No results for input(s): DDIMER in the last 72 hours. -------------------------------------------------------------------------------------------------------------------  Cardiac Enzymes No results for input(s): CKMB, TROPONINI, MYOGLOBIN in the last 168 hours.  Invalid input(s): CK ------------------------------------------------------------------------------------------------------------------ Invalid input(s): POCBNP  ---------------------------------------------------------------------------------------------------------------  Urinalysis    Component Value Date/Time   COLORURINE YELLOW (A) 03/07/2018 0851   APPEARANCEUR CLOUDY (A) 03/07/2018 0851   LABSPEC 1.014 03/07/2018 0851   PHURINE 6.0 03/07/2018 0851   GLUCOSEU NEGATIVE 03/07/2018 0851   HGBUR NEGATIVE 03/07/2018 0851   BILIRUBINUR NEGATIVE 03/07/2018 0851   KETONESUR NEGATIVE 03/07/2018 0851   PROTEINUR 100 (A) 03/07/2018 0851   NITRITE NEGATIVE 03/07/2018  [place] (A) 03/07/2018 0851     RADIOLOGY: Dg Chest 2 View  Result Date: 04/14/2018 CLINICAL DATA:  Patient reports fever, rapid heart rate and SOB onset today. Denies cough or CP. Hx afib, CHF, CABG, GERD, HTN, DM. Non-smoker. EXAM: CHEST - 2 VIEW COMPARISON:  03/09/2018 FINDINGS: Patient has LEFT-sided transvenous pacemaker with leads to the RIGHT ventricle. Heart size is mildly enlarged. There is mild prominence of interstitial markings at the bases. No pleural effusions or alveolar edema. IMPRESSION: 1. Mild cardiomegaly. 2. Mild bilateral LOWER lobe opacities may represent early edema or infiltrates. Electronically Signed   By: Nolon Nations M.D.   On: 04/14/2018 14:27    EKG: Orders placed or performed during the  hospital encounter of 03/07/18  . EKG 12-Lead  . EKG 12-Lead  . EKG    IMPRESSION AND PLAN: *Acute community-acquired pneumonia Compounded by immunosuppression from CLL Admit to regular nursing for bed on our pneumonia protocol empiric Rocephin/azithromycin, follow-up on cultures  *Acute kidney injury with chronic kidney disease stage IV Avoid nephrotoxic agents, strict I&O monitoring, IV fluids for rehydration, BMP in the morning, consult nephrology for expert opinion  *Chronic diabetes mellitus type 2 Stable on current regiment Continue home regiment, sliding scale insulin with Accu-Cheks per routine  *Chronic atrial fibrillation Continue Coumadin-dosing per pharmacy, PT/INR daily, INR currently 1.8 Continue Coreg  *History of systolic congestive heart failure without exacerbation Most recent echocardiogram noted for ejection fraction 4045% Stable On Coumadin, continue Coreg, statin therapy, carbohydrate consistent/heart healthy diet with fluid restriction, losartan  *Chronic morbid obesity Most likely secondary to excess calories Lifestyle modification recommended  *History of CLL Stable [facility] will need to follow-up status post discharge for continued medical monitoring/management  *Chronic GERD without esophagitis PPI daily    All the records are reviewed and case discussed with ED provider. Management plans discussed with the patient, family and they are in agreement.  CODE STATUS:full Code Status History    Date Active Date Inactive Code Status Order ID Comments User Context   03/07/2018 1058 03/10/2018 1548 DNR [id number]  [name], MD ED   07/18/2016 1718 07/19/2016 1717 Full Code [id number]  Thompson Grayer, MD Inpatient   06/12/2015 1810 06/14/2015 1928 Full Code [id number]  Loletha Grayer, MD ED    Questions for Most Recent Historical Code Status (Order [id number])    Question Answer Comment   In the event of cardiac or  respiratory ARREST Do not call a "code blue"    In the event of cardiac or respiratory ARREST Do not perform Intubation, CPR, defibrillation or ACLS    In the event of cardiac or respiratory ARREST Use medication by any route, position, wound care, and other measures to relive pain and suffering. May use oxygen, suction and manual treatment of airway obstruction as needed for comfort.         Advance Directive Documentation     Most Recent Value  Type of Advance Directive  Healthcare Power of Attorney, Living will  Pre-existing out of facility DNR order (yellow form or pink MOST form)  -  "MOST" Form in Place?  -       TOTAL TIME TAKING CARE OF THIS PATIENT: 45 minutes.    Avel Peace Takaya Hyslop M.D on 04/14/2018   Between 7am to 6pm - Pager - [phone]  After 6pm go to [email or web address] - password [email or web address] [place] [facility]  Office  [phone]  CC: Primary  care physician; System, Provider Not In   Note: This dictation was prepared with Dragon dictation along with smaller phrase technology. Any transcriptional errors that result from this process are unintentional.

## 2018-04-14 NOTE — ED Notes (Signed)
Pt is being admitted to [facility] 216. Report was given to Seth Bake, RN and she verbalized understanding of the report given.

## 2018-04-15 LAB — GLUCOSE, CAPILLARY
GLUCOSE-CAPILLARY: 261 mg/dL — AB (ref 70–99)
GLUCOSE-CAPILLARY: 176 mg/dL — AB (ref 70–99)
GLUCOSE-CAPILLARY: 144 mg/dL — AB (ref 70–99)
Glucose-Capillary: 280 mg/dL — ABNORMAL HIGH (ref 70–99)

## 2018-04-15 LAB — CBC
HEMATOCRIT: 28.4 % — AB (ref 35.0–47.0)
HEMOGLOBIN: 9 g/dL — AB (ref 12.0–16.0)
MCH: 27.5 pg (ref 26.0–34.0)
MCHC: 31.6 g/dL — ABNORMAL LOW (ref 32.0–36.0)
MCV: 86.9 fL (ref 80.0–100.0)
Platelets: 133 10*3/uL — ABNORMAL LOW (ref 150–440)
RBC: 3.27 MIL/uL — AB (ref 3.80–5.20)
RDW: 17.5 % — ABNORMAL HIGH (ref 11.5–14.5)
WBC: 43.6 10*3/uL — ABNORMAL HIGH (ref 3.6–11.0)

## 2018-04-15 LAB — BLOOD CULTURE ID PANEL (REFLEXED)
Acinetobacter baumannii: NOT DETECTED
CANDIDA KRUSEI: NOT DETECTED
CANDIDA PARAPSILOSIS: NOT DETECTED
CANDIDA TROPICALIS: NOT DETECTED
CARBAPENEM RESISTANCE: NOT DETECTED
Candida albicans: NOT DETECTED
Candida glabrata: NOT DETECTED
ENTEROBACTERIACEAE SPECIES: DETECTED — AB
ENTEROCOCCUS SPECIES: NOT DETECTED
Enterobacter cloacae complex: NOT DETECTED
Escherichia coli: DETECTED — AB
Haemophilus influenzae: NOT DETECTED
KLEBSIELLA PNEUMONIAE: NOT DETECTED
Klebsiella oxytoca: NOT DETECTED
LISTERIA MONOCYTOGENES: NOT DETECTED
Methicillin resistance: NOT DETECTED
Neisseria meningitidis: NOT DETECTED
PSEUDOMONAS AERUGINOSA: NOT DETECTED
Proteus species: NOT DETECTED
SERRATIA MARCESCENS: NOT DETECTED
STAPHYLOCOCCUS AUREUS BCID: NOT DETECTED
STAPHYLOCOCCUS SPECIES: NOT DETECTED
STREPTOCOCCUS AGALACTIAE: NOT DETECTED
Streptococcus pneumoniae: NOT DETECTED
Streptococcus pyogenes: NOT DETECTED
Streptococcus species: NOT DETECTED
VANCOMYCIN RESISTANCE: NOT DETECTED

## 2018-04-15 LAB — PROTIME-INR
INR: 2.11
PROTHROMBIN TIME: 23.5 s — AB (ref 11.4–15.2)

## 2018-04-15 LAB — BASIC METABOLIC PANEL
Anion gap: 11 (ref 5–15)
BUN: 59 mg/dL — ABNORMAL HIGH (ref 8–23)
CHLORIDE: 102 mmol/L (ref 98–111)
CO2: 20 mmol/L — AB (ref 22–32)
CREATININE: 4.07 mg/dL — AB (ref 0.44–1.00)
Calcium: 7.4 mg/dL — ABNORMAL LOW (ref 8.9–10.3)
GFR calc non Af Amer: 10 mL/min — ABNORMAL LOW (ref >60)
GFR, EST AFRICAN AMERICAN: 12 mL/min — AB (ref >60)
Glucose, Bld: 329 mg/dL — ABNORMAL HIGH (ref 70–99)
Potassium: 5.9 mmol/L — ABNORMAL HIGH (ref 3.5–5.1)
Sodium: 133 mmol/L — ABNORMAL LOW (ref 135–145)

## 2018-04-15 LAB — STREP PNEUMONIAE URINARY ANTIGEN: STREP PNEUMO URINARY ANTIGEN: NEGATIVE

## 2018-04-15 LAB — LACTIC ACID, PLASMA: Lactic Acid, Venous: 1.5 mmol/L (ref 0.5–1.9)

## 2018-04-15 MED ORDER — SODIUM CHLORIDE 0.9 % IV SOLN
1.0000 g | Freq: Two times a day (BID) | INTRAVENOUS | Status: DC
  Administered 2018-04-15 – 2018-04-17 (×6): 1 g via INTRAVENOUS
  Filled 2018-04-15 (×7): qty 1

## 2018-04-15 MED ORDER — VANCOMYCIN HCL 10 G IV SOLR
1250.0000 mg | INTRAVENOUS | Status: DC
  Administered 2018-04-15: 1250 mg via INTRAVENOUS
  Filled 2018-04-15 (×2): qty 1250

## 2018-04-15 MED ORDER — WARFARIN SODIUM 6 MG PO TABS
6.0000 mg | ORAL_TABLET | Freq: Once | ORAL | Status: AC
  Administered 2018-04-15: 6 mg via ORAL
  Filled 2018-04-15: qty 1

## 2018-04-15 MED ORDER — ISOSORBIDE MONONITRATE ER 30 MG PO TB24
30.0000 mg | ORAL_TABLET | Freq: Two times a day (BID) | ORAL | Status: DC
  Administered 2018-04-15 – 2018-04-19 (×8): 30 mg via ORAL
  Filled 2018-04-15 (×8): qty 1

## 2018-04-15 MED ORDER — ISOSORBIDE MONONITRATE ER 30 MG PO TB24
30.0000 mg | ORAL_TABLET | Freq: Every day | ORAL | Status: DC
  Administered 2018-04-15: 30 mg via ORAL

## 2018-04-15 NOTE — Progress Notes (Signed)
Informed Dr Brett Albino re blood pressure, HR, and IVF. Refer to orders.

## 2018-04-15 NOTE — Progress Notes (Signed)
Pharmacy Antibiotic Note  Caroline Hernandez is a 72 y.o. female admitted on 04/14/2018 with pneumonia.  Pharmacy has been consulted for vancomycin dosing.  Plan: Will start patient on vanc 1.25g IV q36h   Will draw vanc trough 08/08 @ 0100 prior to 3rd dose.  Ke 0.020 T1/2 36 hrs Goal trough 15 - 20 mcg/mL  Height: 5\' 8"  (172.7 cm) Weight: 211 lb (95.7 kg) IBW/kg (Calculated) : 63.9  Temp (24hrs), Avg:98.6 F (37 C), Min:98.1 F (36.7 C), Max:99.1 F (37.3 C)  Recent Labs  Lab 04/14/18 1343 04/14/18 1344  WBC 42.4*  --   CREATININE 3.35*  --   LATICACIDVEN  --  2.1*    Estimated Creatinine Clearance: 18.9 mL/min (A) (by C-G formula based on SCr of 3.35 mg/dL (H)).    Allergies  Allergen Reactions  . Zosyn [Piperacillin Sod-Tazobactam So] Other (See Comments)    Renal failure    Thank you for allowing pharmacy to be a part of this patient's care.  Tobie Lords, PharmD, BCPS Clinical Pharmacist /04/15/2018

## 2018-04-15 NOTE — Progress Notes (Signed)
Reviewed labs from this morning- Cr has increased from 3.35 to 4.07. MRSA PCR pending. If negative, will stop Vancomycin. Nephrology consulted.  Hyman Bible, MD

## 2018-04-15 NOTE — Progress Notes (Signed)
ANTICOAGULATION CONSULT NOTE - Initial Consult  Pharmacy Consult for warfarin Indication: atrial fibrillation  Allergies  Allergen Reactions  . Zosyn [Piperacillin Sod-Tazobactam So] Other (See Comments)    Renal failure    Patient Measurements: Height: 5\' 8"  (172.7 cm) Weight: 211 lb (95.7 kg) IBW/kg (Calculated) : 63.9   Vital Signs: Temp: 98.3 F (36.8 C) (08/05 1201) Temp Source: Oral (08/05 1201) BP: 108/68 (08/05 1201) Pulse Rate: 62 (08/05 1201)  Labs: Recent Labs    04/14/18 1343 04/15/18 0410 04/15/18 1004  HGB 10.9*  --  9.0*  HCT 34.0*  --  28.4*  PLT 167  --  133*  LABPROT 21.5* 23.5*  --   INR 1.89 2.11  --   CREATININE 3.35*  --  4.07*    Estimated Creatinine Clearance: 15.6 mL/min (A) (by C-G formula based on SCr of 4.07 mg/dL (H)).   Medical History: Past Medical History:  Diagnosis Date  . Amputation of left lower extremity below knee ([place])   . Amputation of right lower extremity below knee ([place])   . CAD (coronary artery disease)    a. 2004 Cardiac Arrest/CABG x 3 (LIMA->LAD, VG->OM, VG->RCA);  b. 06/2015 lexiscan MV: no significant ischemia, EF 48%, low risk->Med Rx.  . Chronic combined systolic and diastolic CHF (congestive heart failure) ([place])    a. 12/2014 Echo: EF 45-50%;  b. 08/2015 Echo: EF 45-50%, ant, antsept HK, mildly dil LA, nl RV, mild-mod TR, sev PAH (51mmHg).  . CKD (chronic kidney disease), stage IV ([place])   . CLL (chronic lymphocytic leukemia) ([place])   . Diabetes mellitus without complication ([place])   . Essential hypertension   . GERD (gastroesophageal reflux disease)   . Hiatal hernia   . History of cardiac arrest    a. 2004.  Marland Kitchen Hyperlipidemia   . Ischemic cardiomyopathy    a. s/p MDT ICD (originally had 3419 lead-->gen change and lead revision ~ 2012 @ [place]); b. 12/2014 Echo: EF 45-50%;  c.08/2015 Echo: EF 45-50%, ant, antsept HK. Nl RV.  Marland Kitchen PAD (peripheral artery disease) ([facility])    a. s/p BKA  . Persistent atrial fibrillation  ([place])    a. Dx 12/2014.  CHA2DS2VASc = 6--> warfarin;  b. 09/2015 s/p DCCV-->on amio; c. s/p DCCV 04/25/16    Assessment: 72 year old female admitted for CAP. Patient takes warfarin for afib. Pharmacy has been consulted for warfarin dosing. The patient's home dose is 6 mg on Mon, Tues, Thurs, Fri and Sat and 4 mg on Wed and Sun.  8/4  INR 1.89    Warfarin 6mg  8/5  INR 2.11 Goal of Therapy:  INR 2-3 Monitor platelets by anticoagulation protocol: Yes   Plan:  Will order Warfarin 6mg  today. Daily INR.  Delphia Grates, PharmD, BCPS 04/15/2018 12:14 PM

## 2018-04-15 NOTE — Progress Notes (Addendum)
[facility] [place] NAME: Caroline Hernandez    MR#:  [id number]  DATE OF BIRTH:  20-Jan-1946  SUBJECTIVE:  States she is feeling better. Shortness of breath has improved. Having a mild cough. Chills have resolved. Denies dysuria.  REVIEW OF SYSTEMS:  Review of Systems  Constitutional: Negative for chills and fever.  HENT: Negative for congestion and sore throat.   Eyes: Negative for blurred vision and double vision.  Respiratory: Positive for shortness of breath. Negative for cough.   Cardiovascular: Negative for chest pain and palpitations.  Gastrointestinal: Negative for nausea and vomiting.  Genitourinary: Negative for dysuria, frequency and urgency.  Musculoskeletal: Negative for back pain and neck pain.  Neurological: Negative for dizziness and headaches.    DRUG ALLERGIES:   Allergies  Allergen Reactions  . Zosyn [Piperacillin Sod-Tazobactam So] Other (See Comments)    Renal failure   VITALS:  Blood pressure 102/67, pulse 64, temperature 98.2 F (36.8 C), temperature source Oral, resp. rate 20, height 5\' 8"  (1.727 m), weight 95.7 kg (211 lb), SpO2 92 %. PHYSICAL EXAMINATION:  Physical Exam  GENERAL:  72 y.o.-year-old patient lying in the bed with no acute distress.  EYES: Pupils equal, round, reactive to light and accommodation. No scleral icterus. Extraocular muscles intact.  HEENT: Head atraumatic, normocephalic. Oropharynx and nasopharynx clear. MMM.  NECK:  Supple, no jugular venous distention. No thyroid enlargement, no tenderness.  LUNGS: Normal work of breathing. +crackles in the left lung base. CARDIOVASCULAR: Irregularly irregular rhythm, bradycardic. No murmurs, rubs, or gallops.  ABDOMEN: Soft, nontender, nondistended. Bowel sounds present. No organomegaly or mass.  EXTREMITIES: No edema, cyanosis, or clubbing.  Bilateral BKA's. NEUROLOGIC: Cranial nerves II through XII are intact. No focal weakness. Gait not  checked.  PSYCHIATRIC: The patient is alert and oriented x 3.  SKIN: No obvious rash, lesion, or ulcer.   LABORATORY PANEL:  Female CBC Recent Labs  Lab 04/14/18 1343  WBC 42.4*  HGB 10.9*  HCT 34.0*  PLT 167   ------------------------------------------------------------------------------------------------------------------ Chemistries  Recent Labs  Lab 04/14/18 1343  NA 137  K 5.0  CL 102  CO2 24  GLUCOSE 119*  BUN 41*  CREATININE 3.35*  CALCIUM 8.5*  AST 22  ALT 12  ALKPHOS 74  BILITOT 1.8*   RADIOLOGY:  Dg Chest 2 View  Result Date: 04/14/2018 CLINICAL DATA:  Patient reports fever, rapid heart rate and SOB onset today. Denies cough or CP. Hx afib, CHF, CABG, GERD, HTN, DM. Non-smoker. EXAM: CHEST - 2 VIEW COMPARISON:  03/09/2018 FINDINGS: Patient has LEFT-sided transvenous pacemaker with leads to the RIGHT ventricle. Heart size is mildly enlarged. There is mild prominence of interstitial markings at the bases. No pleural effusions or alveolar edema. IMPRESSION: 1. Mild cardiomegaly. 2. Mild bilateral LOWER lobe opacities may represent early edema or infiltrates. Electronically Signed   By: Nolon Nations M.D.   On: 04/14/2018 14:27   ASSESSMENT AND PLAN:   *Sepsis 2/2 CAP- compounded by immunosuppression from CLL. Still on 2L O2 this morning. Blood culture ID panel positive for E. Coli and Enterobacteriaceae. UA on admission with large leukocytes and many bacteria. - initially on rocephin/azithromycin, changed to vanc/mero (zosyn allergy) - blood and urine cultures pending - trend lactic acid - wean O2 as able  *Acute kidney injury with chronic kidney disease stage IV- likely due to dehydration. Concern that this may worsen on vancomycin.  - hold losartan - Avoid nephrotoxic agents - strict  I&O - continue IV fluids - bmp pending for this morning - consider nephrology consult  *Hypertension- BPs on the low side this morning. - hold norvasc and losartan -  continue coreg - decrease imdur from 60mg  bid to 30mg  bid  *Chronic diabetes mellitus type 2- blood sugars high this morning - continue lantus 38 units in the morning and 14 units in the evening. Will likely increase morning lantus dose tomorrow. - continue resistant SSI  *Chronic atrial fibrillation- rate-controlled, INR therapeutic. - continue Coumadin - continue Coreg  *History of systolic congestive heart failure- no signs of fluid overload. Last ECHO with EF 40-45%. - continue coreg and statin - hold losartan for AKI  *Chronic morbid obesity - discussed lifestyle modification  *History of CLL- follows with Dr. Selena Batten [facility]) - will need f/u on discharge  *Chronic GERD without esophagitis - PPI daily  All the records are reviewed and case discussed with Care Management/Social Worker. Management plans discussed with the patient, family and they are in agreement.  CODE STATUS: Full Code  TOTAL TIME TAKING CARE OF THIS PATIENT: 30 minutes.   More than 50% of the time was spent in counseling/coordination of care: YES  POSSIBLE D/C IN 1-2 DAYS, DEPENDING ON CLINICAL CONDITION and ability to wean off O2.   Berna Spare Ethell Blatchford M.D on 04/15/2018 at 9:49 AM  Between 7am to 6pm - Pager - [phone]  After 6pm go to [email or web address] - Proofreader  [facility] [place] [facility]  Office  [phone]  CC: Primary care physician; System, Provider Not In  Note: This dictation was prepared with Dragon dictation along with smaller phrase technology. Any transcriptional errors that result from this process are unintentional.

## 2018-04-15 NOTE — Progress Notes (Signed)
PHARMACY - PHYSICIAN COMMUNICATION CRITICAL VALUE ALERT - BLOOD CULTURE IDENTIFICATION (BCID)  Caroline Hernandez is an 72 y.o. female who presented to [facility] on 04/14/2018 with a chief complaint of fever, tachycardia, and hypoxia  Assessment:  Hypotensive w/ systolic 197'J, WBC 88.3??, O2 91 - 92, LA 2.1, AKI on CKD Scr 3.35 (BL 2.2 - 2.5), UA leukocyte +, many bacteria, CXR LLB opacity, 2/4 anaerobic bottles BCID Enterobacteriacaea E. Coli [facility] -  Name of physician (or Provider) Contacted: Arta Silence  Current antibiotics: Azithromycin/ceftriaxone  Changes to prescribed antibiotics recommended:  Recommendations accepted by provider -- transitioning over to meropenem 1g IV q12h per CrCl 10 - 25 ml/min for possible ESBL E. Coli bacteremia and d/cing azithro/ceftriaxone and starting vanc 1.25g IV q36h for pneumonia  Results for orders placed or performed during the hospital encounter of 04/14/18  Blood Culture ID Panel (Reflexed) (Collected: 04/14/2018  1:44 PM)  Result Value Ref Range   Enterococcus species NOT DETECTED NOT DETECTED   Vancomycin resistance NOT DETECTED NOT DETECTED   Listeria monocytogenes NOT DETECTED NOT DETECTED   Staphylococcus species NOT DETECTED NOT DETECTED   Staphylococcus aureus NOT DETECTED NOT DETECTED   Methicillin resistance NOT DETECTED NOT DETECTED   Streptococcus species NOT DETECTED NOT DETECTED   Streptococcus agalactiae NOT DETECTED NOT DETECTED   Streptococcus pneumoniae NOT DETECTED NOT DETECTED   Streptococcus pyogenes NOT DETECTED NOT DETECTED   Acinetobacter baumannii NOT DETECTED NOT DETECTED   Enterobacteriaceae species DETECTED (A) NOT DETECTED   Enterobacter cloacae complex NOT DETECTED NOT DETECTED   Escherichia coli DETECTED (A) NOT DETECTED   Klebsiella oxytoca NOT DETECTED NOT DETECTED   Klebsiella pneumoniae NOT DETECTED NOT DETECTED   Proteus species NOT DETECTED NOT DETECTED   Serratia marcescens NOT DETECTED NOT DETECTED   Carbapenem resistance NOT DETECTED NOT DETECTED   Haemophilus influenzae NOT DETECTED NOT DETECTED   Neisseria meningitidis NOT DETECTED NOT DETECTED   Pseudomonas aeruginosa NOT DETECTED NOT DETECTED   Candida albicans NOT DETECTED NOT DETECTED   Candida glabrata NOT DETECTED NOT DETECTED   Candida krusei NOT DETECTED NOT DETECTED   Candida parapsilosis NOT DETECTED NOT DETECTED   Candida tropicalis NOT DETECTED NOT DETECTED   Tobie Lords, PharmD, BCPS Clinical Pharmacist 04/15/2018

## 2018-04-16 ENCOUNTER — Inpatient Hospital Stay: Payer: Medicare HMO

## 2018-04-16 LAB — HIV ANTIBODY (ROUTINE TESTING W REFLEX): HIV Screen 4th Generation wRfx: NONREACTIVE

## 2018-04-16 LAB — CBC
HCT: 30.3 % — ABNORMAL LOW (ref 35.0–47.0)
Hemoglobin: 9.6 g/dL — ABNORMAL LOW (ref 12.0–16.0)
MCH: 27.4 pg (ref 26.0–34.0)
MCHC: 31.7 g/dL — ABNORMAL LOW (ref 32.0–36.0)
MCV: 86.5 fL (ref 80.0–100.0)
Platelets: 162 10*3/uL (ref 150–440)
RBC: 3.5 MIL/uL — ABNORMAL LOW (ref 3.80–5.20)
RDW: 17.8 % — AB (ref 11.5–14.5)
WBC: 52.8 10*3/uL (ref 3.6–11.0)

## 2018-04-16 LAB — BASIC METABOLIC PANEL
Anion gap: 10 (ref 5–15)
BUN: 68 mg/dL — AB (ref 8–23)
CALCIUM: 7.6 mg/dL — AB (ref 8.9–10.3)
CO2: 21 mmol/L — ABNORMAL LOW (ref 22–32)
Chloride: 104 mmol/L (ref 98–111)
Creatinine, Ser: 4.64 mg/dL — ABNORMAL HIGH (ref 0.44–1.00)
GFR calc Af Amer: 10 mL/min — ABNORMAL LOW (ref >60)
GFR, EST NON AFRICAN AMERICAN: 9 mL/min — AB (ref >60)
GLUCOSE: 120 mg/dL — AB (ref 70–99)
Potassium: 5.3 mmol/L — ABNORMAL HIGH (ref 3.5–5.1)
SODIUM: 135 mmol/L (ref 135–145)

## 2018-04-16 LAB — GLUCOSE, CAPILLARY
GLUCOSE-CAPILLARY: 94 mg/dL (ref 70–99)
Glucose-Capillary: 71 mg/dL (ref 70–99)
Glucose-Capillary: 102 mg/dL — ABNORMAL HIGH (ref 70–99)
Glucose-Capillary: 101 mg/dL — ABNORMAL HIGH (ref 70–99)

## 2018-04-16 LAB — PROTIME-INR
INR: 2.5
PROTHROMBIN TIME: 26.8 s — AB (ref 11.4–15.2)

## 2018-04-16 LAB — MRSA PCR SCREENING: MRSA by PCR: POSITIVE — AB

## 2018-04-16 LAB — LEGIONELLA PNEUMOPHILA SEROGP 1 UR AG: L. PNEUMOPHILA SEROGP 1 UR AG: NEGATIVE

## 2018-04-16 MED ORDER — WARFARIN SODIUM 5 MG PO TABS
5.0000 mg | ORAL_TABLET | Freq: Once | ORAL | Status: AC
  Administered 2018-04-16: 5 mg via ORAL
  Filled 2018-04-16: qty 1

## 2018-04-16 NOTE — Care Management (Addendum)
Patient admitted from home with PNA.  Patient lives at home with daughter.  PCP [facility].  Patient with history of bilateral BKA.  Patient has scooter, RW, WC, and shower bench in the home.  Patient currently requiring acute 2 L O2.  Previously discharged from this facility with home health services through [facility].  Awaiting return call  From [facility] with [place] to determine if patient is still open  Update:  Per Corene Cornea with [facility] was able to reach patient x1 via phone after discharge, however they attempted multiple times to reach patient to set up 1st visit and was not able to reach patient.  Therefore case was not open   04/17/18 - 1426 pm.  Patient agreeable to home health services at discharge if indicated. And would like to use [facility].  Heads up given to [facility] with [place]

## 2018-04-16 NOTE — Consult Note (Signed)
[facility]  CONSULT NOTE    Date: 04/16/2018                  Patient Name:  Caroline Hernandez  MRN: [id number]  DOB: 01-Jul-1946  Age / Sex: 72 y.o., female         PCP: System, Provider Not In                 Service Requesting Consult: Dr. Brett Albino                 Reason for Consult: Acute renal failure            History of Present Illness: [name] was admitted to [facility] on 04/14/2018 for pneumonia and urinary tract infection. Hypoxic on examination. Started on IV fluids however creatinine continues to rise and nephrology consulted.   Patient states she has been seen by nephrology in the past but has not been followed for several years.   Recent hospitalization from 6/27 to 6/30 for urinary tract infection complicated with acute exacerbation of congestive heart failure.     Medications: Outpatient medications: Medications Prior to Admission  Medication Sig Dispense Refill Last Dose  . albuterol (PROVENTIL HFA;VENTOLIN HFA) 108 (90 Base) MCG/ACT inhaler Inhale 2 puffs into the lungs every 6 (six) hours as needed for wheezing or shortness of breath. 1 Inhaler 0 PRN at PRN  . amitriptyline (ELAVIL) 25 MG tablet Take 25 mg by mouth at bedtime.   11 04/13/2018 at 2000  . amLODipine (NORVASC) 10 MG tablet Take 1 tablet (10 mg total) by mouth daily. 90 tablet 3 04/14/2018 at 0830  . atorvastatin (LIPITOR) 40 MG tablet Take 40 mg by mouth at bedtime.   11 04/13/2018 at 2000  . carvedilol (COREG) 3.125 MG tablet TAKE 1 TABLET BY MOUTH TWICE DAILY 60 tablet 3 04/14/2018 at 0830  . Cholecalciferol (HM VITAMIN D3) 4000 UNITS CAPS Take 4,000 Units by mouth daily.   04/14/2018 at 0800  . furosemide (LASIX) 40 MG tablet TAKE 1 TABLET(40 MG) BY MOUTH TWICE DAILY 60 tablet 3 04/14/2018 at 0830  . gabapentin (NEURONTIN) 100 MG capsule Take 100 mg by mouth 2 (two) times daily.   04/14/2018 at 0830  . insulin aspart (NOVOLOG) 100 UNIT/ML injection Inject 5-10 Units into the skin 3 (three)  times daily before meals. 10 units into the skin before breakfast then 5 units before lunch then 10 units before supper (evening meal)   04/14/2018 at 0830  . insulin glargine (LANTUS) 100 UNIT/ML injection Inject 14-38 Units into the skin 2 (two) times daily before a meal. 38 units into the skin before breakfast and 14 units before supper (evening meal)   04/14/2018 at 0830  . iron polysaccharides (NIFEREX) 150 MG capsule Take 150 mg by mouth daily.   04/14/2018 at 0830  . isosorbide mononitrate (IMDUR) 30 MG 24 hr tablet Take 2 tablets (60 mg total) by mouth 2 (two) times daily. (Patient taking differently: Take 60 mg by mouth 2 (two) times daily. Take 60 mg by mouth in the morning and 30 mg by mouth at night.) 120 tablet 6 04/14/2018 at 0830  . losartan (COZAAR) 100 MG tablet Take 100 mg by mouth every evening.   11 04/13/2018 at 2000  . Multiple Vitamins-Minerals (VITEYES AREDS FORMULA/LUTEIN) CAPS Take 1 capsule by mouth 2 (two) times daily.   04/14/2018 at 0830  . nitroGLYCERIN (NITROSTAT) 0.4 MG SL tablet Place 1 tablet (0.4 mg  total) under the tongue every 5 (five) minutes as needed for chest pain. 30 tablet 0 prn at prn  . pantoprazole (PROTONIX) 40 MG tablet Take 40 mg by mouth at bedtime.   04/13/2018 at 2000  . potassium chloride SA (K-DUR,KLOR-CON) 20 MEQ tablet TAKE 1 TABLET BY MOUTH TWICE DAILY (Patient taking differently: TAKE 1 TABLET BY MOUTH ONCE DAILY) 180 tablet 0 04/14/2018 at 0830  . PREVIDENT 5000 DRY MOUTH 1.1 % GEL dental gel Place 1 application onto teeth daily.   5 04/14/2018 at 0830  . sertraline (ZOLOFT) 100 MG tablet Take 100 mg by mouth at bedtime.   11 04/13/2018 at 2000  . vitamin C (ASCORBIC ACID) 500 MG tablet Take 500 mg by mouth daily.   04/14/2018 at 0830  . warfarin (COUMADIN) 4 MG tablet TAKE AS DIRECTED BY COUMADIN CLINIC 50 tablet 1 04/13/2018 at 1830    Current medications: Current Facility-Administered Medications  Medication Dose Route Frequency Provider Last Rate Last Dose  .  0.9 %  sodium chloride infusion   Intravenous Continuous Salary, Montell D, MD 100 mL/hr at 04/16/18 0141    . albuterol (PROVENTIL) (2.5 MG/3ML) 0.083% nebulizer solution 3 mL  3 mL Inhalation Q6H PRN Salary, Montell D, MD      . alum & mag hydroxide-simeth (MAALOX/MYLANTA) 200-200-20 MG/5ML suspension 30 mL  30 mL Oral Q4H PRN Salary, Montell D, MD   30 mL at 04/14/18 2248  . amitriptyline (ELAVIL) tablet 25 mg  25 mg Oral QHS Salary, Montell D, MD   25 mg at 04/15/18 2248  . atorvastatin (LIPITOR) tablet 40 mg  40 mg Oral QHS Salary, Montell D, MD   40 mg at 04/15/18 2248  . carvedilol (COREG) tablet 3.125 mg  3.125 mg Oral BID Salary, Holly Bodily D, MD   3.125 mg at 04/15/18 2249  . cholecalciferol (VITAMIN D) tablet 4,000 Units  4,000 Units Oral Daily Gorden Harms, MD   4,000 Units at 04/15/18 0846  . gabapentin (NEURONTIN) capsule 100 mg  100 mg Oral BID Loney Hering D, MD   100 mg at 04/15/18 2247  . insulin aspart (novoLOG) injection 0-20 Units  0-20 Units Subcutaneous TID WC Salary, Montell D, MD   4 Units at 04/15/18 1714  . insulin aspart (novoLOG) injection 0-5 Units  0-5 Units Subcutaneous QHS Loney Hering D, MD   3 Units at 04/14/18 2248  . insulin aspart (novoLOG) injection 10 Units  10 Units Subcutaneous BID AC Tawnya Crook, RPH   10 Units at 04/16/18 5852  . insulin aspart (novoLOG) injection 5 Units  5 Units Subcutaneous QAC lunch Tawnya Crook, RPH   5 Units at 04/15/18 1243  . insulin glargine (LANTUS) injection 14 Units  14 Units Subcutaneous QAC supper Tawnya Crook, RPH   14 Units at 04/15/18 1715  . insulin glargine (LANTUS) injection 38 Units  38 Units Subcutaneous QAC breakfast Tawnya Crook, RPH   38 Units at 04/16/18 0842  . iron polysaccharides (NIFEREX) capsule 150 mg  150 mg Oral Daily Salary, Montell D, MD   150 mg at 04/15/18 0846  . isosorbide mononitrate (IMDUR) 24 hr tablet 30 mg  30 mg Oral BID Mayo, Pete Pelt, MD   30 mg at 04/15/18 2248   . meropenem (MERREM) 1 g in sodium chloride 0.9 % 100 mL IVPB  1 g Intravenous Q12H Arta Silence, MD   Stopped at 04/16/18 0215  . multivitamin-lutein (OCUVITE-LUTEIN) capsule 1 capsule  1 capsule Oral BID Salary, Holly Bodily D, MD   1 capsule at 04/15/18 2247  . nitroGLYCERIN (NITROSTAT) SL tablet 0.4 mg  0.4 mg Sublingual Q5 min PRN Salary, Montell D, MD      . pantoprazole (PROTONIX) EC tablet 40 mg  40 mg Oral QHS Salary, Montell D, MD   40 mg at 04/15/18 2247  . sertraline (ZOLOFT) tablet 100 mg  100 mg Oral QHS Salary, Montell D, MD   100 mg at 04/15/18 2248  . vancomycin (VANCOCIN) 1,250 mg in sodium chloride 0.9 % 250 mL IVPB  1,250 mg Intravenous Q36H Arta Silence, MD   Stopped at 04/15/18 0600  . vitamin C (ASCORBIC ACID) tablet 500 mg  500 mg Oral Daily Salary, Montell D, MD   500 mg at 04/15/18 0846  . Warfarin - Pharmacist Dosing Inpatient   Does not apply q1800 Tawnya Crook, [facility]          Allergies: Allergies  Allergen Reactions  . Zosyn [Piperacillin Sod-Tazobactam So] Other (See Comments)    Renal failure      Past Medical History: Past Medical History:  Diagnosis Date  . Amputation of left lower extremity below knee ([place])   . Amputation of right lower extremity below knee ([place])   . CAD (coronary artery disease)    a. 2004 Cardiac Arrest/CABG x 3 (LIMA->LAD, VG->OM, VG->RCA);  b. 06/2015 lexiscan MV: no significant ischemia, EF 48%, low risk->Med Rx.  . Chronic combined systolic and diastolic CHF (congestive heart failure) ([place])    a. 12/2014 Echo: EF 45-50%;  b. 08/2015 Echo: EF 45-50%, ant, antsept HK, mildly dil LA, nl RV, mild-mod TR, sev PAH (74mmHg).  . CKD (chronic kidney disease), stage IV ([place])   . CLL (chronic lymphocytic leukemia) ([place])   . Diabetes mellitus without complication ([place])   . Essential hypertension   . GERD (gastroesophageal reflux disease)   . Hiatal hernia   . History of cardiac arrest    a. 2004.  Marland Kitchen Hyperlipidemia   .  Ischemic cardiomyopathy    a. s/p MDT ICD (originally had 9024 lead-->gen change and lead revision ~ 2012 @ [facility]); b. 12/2014 Echo: EF 45-50%;  c.08/2015 Echo: EF 45-50%, ant, antsept HK. Nl RV.  Marland Kitchen PAD (peripheral artery disease) ([facility])    a. s/p BKA  . Persistent atrial fibrillation ([place])    a. Dx 12/2014.  CHA2DS2VASc = 6--> warfarin;  b. 09/2015 s/p DCCV-->on amio; c. s/p DCCV 04/25/16     Past Surgical History: Past Surgical History:  Procedure Laterality Date  . ABDOMINAL HYSTERECTOMY    . Amputation lower extremity bilaterally Bilateral   . CARDIAC CATHETERIZATION    . CARDIOVERSION N/A 10/09/2016   Procedure: CARDIOVERSION;  Surgeon: Lelon Perla, MD;  Location: [facility] ENDOSCOPY;  Service: Cardiovascular;  Laterality: N/A;  . CORONARY ARTERY BYPASS GRAFT    . ELECTROPHYSIOLOGIC STUDY N/A 10/07/2015   Procedure: CARDIOVERSION;  Surgeon: Minna Merritts, MD;  Location: [facility] ORS;  Service: Cardiovascular;  Laterality: N/A;  . ELECTROPHYSIOLOGIC STUDY N/A 04/25/2016   Procedure: Cardioversion;  Surgeon: Minna Merritts, MD;  Location: [facility] ORS;  Service: Cardiovascular;  Laterality: N/A;  . ELECTROPHYSIOLOGIC STUDY N/A 07/18/2016   Procedure: Atrial Fibrillation Ablation;  Surgeon: [place] Grayer, MD;  Location: [facility] CV LAB;  Service: Cardiovascular;  Laterality: N/A;  . IMPLANTABLE CARDIOVERTER DEFIBRILLATOR IMPLANT  2005   [employer]   . TEE WITHOUT CARDIOVERSION N/A 07/18/2016   Procedure: TRANSESOPHAGEAL ECHOCARDIOGRAM (TEE);  Surgeon: Sueanne Margarita, MD;  Location: [facility] ENDOSCOPY;  Service: Cardiovascular;  Laterality: N/A;  . TEE WITHOUT CARDIOVERSION N/A 10/09/2016   Procedure: TRANSESOPHAGEAL ECHOCARDIOGRAM (TEE);  Surgeon: Lelon Perla, MD;  Location: [facility] ENDOSCOPY;  Service: Cardiovascular;  Laterality: N/A;     Family History: Family History  Problem Relation Age of Onset  . CAD Mother   . Diabetes Mother   . Atrial fibrillation Mother   . Lung cancer Father   .  Diabetes Father      Social History: Social History   Socioeconomic History  . Marital status: Divorced    Spouse name: Not on file  . Number of children: Not on file  . Years of education: Not on file  . Highest education level: Not on file  Occupational History  . Not on file  Social Needs  . Financial resource strain: Not on file  . Food insecurity:    Worry: Not on file    Inability: Not on file  . Transportation needs:    Medical: Not on file    Non-medical: Not on file  Tobacco Use  . Smoking status: Never Smoker  . Smokeless tobacco: Never Used  Substance and Sexual Activity  . Alcohol use: No  . Drug use: No  . Sexual activity: Not on file  Lifestyle  . Physical activity:    Days per week: Not on file    Minutes per session: Not on file  . Stress: Not on file  Relationships  . Social connections:    Talks on phone: Not on file    Gets together: Not on file    Attends religious service: Not on file    Active member of club or organization: Not on file    Attends meetings of clubs or organizations: Not on file    Relationship status: Not on file  . Intimate partner violence:    Fear of current or ex partner: Not on file    Emotionally abused: Not on file    Physically abused: Not on file    Forced sexual activity: Not on file  Other Topics Concern  . Not on file  Social History Narrative  . Not on file     Review of Systems: Review of Systems  Constitutional: Negative.  Negative for chills, diaphoresis, fever, malaise/fatigue and weight loss.  HENT: Negative.  Negative for congestion, ear discharge, ear pain, hearing loss, nosebleeds, sinus pain, sore throat and tinnitus.   Eyes: Negative.  Negative for blurred vision, double vision, photophobia, pain, discharge and redness.  Respiratory: Negative.  Negative for cough, hemoptysis, sputum production, shortness of breath, wheezing and stridor.   Cardiovascular: Positive for palpitations. Negative for  chest pain, orthopnea, claudication, leg swelling and PND.  Gastrointestinal: Negative.  Negative for abdominal pain, blood in stool, constipation, diarrhea, heartburn, melena, nausea and vomiting.  Genitourinary: Positive for dysuria. Negative for flank pain, frequency, hematuria and urgency.  Musculoskeletal: Negative.  Negative for back pain, falls, joint pain, myalgias and neck pain.  Skin: Negative.  Negative for itching and rash.  Neurological: Negative.  Negative for dizziness, tingling, tremors, sensory change, speech change, focal weakness, seizures, loss of consciousness, weakness and headaches.  Endo/Heme/Allergies: Negative.  Negative for environmental allergies and polydipsia. Does not bruise/bleed easily.  Psychiatric/Behavioral: Negative.  Negative for depression, hallucinations, memory loss, substance abuse and suicidal ideas. The patient is not nervous/anxious and does not have insomnia.     Vital Signs: Blood pressure 131/63, pulse 61,  temperature 98.4 F (36.9 C), temperature source Oral, resp. rate 20, height 5\' 8"  (1.727 m), weight 95.7 kg (211 lb), SpO2 95 %.  Weight trends: Filed Weights   04/14/18 1320  Weight: 95.7 kg (211 lb)    Physical Exam: General: NAD,   Head: Normocephalic, atraumatic. Moist oral mucosal membranes  Eyes: Anicteric, PERRL  Neck: Supple, trachea midline  Lungs:  Clear to auscultation  Heart: irregular  Abdomen:  Soft, nontender,   Extremities: no peripheral edema, bilateral below the knee amputations  Neurologic: Nonfocal, moving all four extremities  Skin: No lesions         Lab results: Basic Metabolic Panel: Recent Labs  Lab 04/14/18 1343 04/15/18 1004 04/16/18 0434  NA 137 133* 135  K 5.0 5.9* 5.3*  CL 102 102 104  CO2 24 20* 21*  GLUCOSE 119* 329* 120*  BUN 41* 59* 68*  CREATININE 3.35* 4.07* 4.64*  CALCIUM 8.5* 7.4* 7.6*    Liver Function Tests: Recent Labs  Lab 04/14/18 1343  AST 22  ALT 12  ALKPHOS 74   BILITOT 1.8*  PROT 7.3  ALBUMIN 3.7   No results for input(s): LIPASE, AMYLASE in the last 168 hours. No results for input(s): AMMONIA in the last 168 hours.  CBC: Recent Labs  Lab 04/14/18 1343 04/15/18 1004 04/16/18 0434  WBC 42.4* 43.6* 52.8*  NEUTROABS 15.3*  --   --   HGB 10.9* 9.0* 9.6*  HCT 34.0* 28.4* 30.3*  MCV 85.7 86.9 86.5  PLT 167 133* 162    Cardiac Enzymes: No results for input(s): CKTOTAL, CKMB, CKMBINDEX, TROPONINI in the last 168 hours.  BNP: Invalid input(s): POCBNP  CBG: Recent Labs  Lab 04/15/18 0729 04/15/18 1158 04/15/18 1705 04/15/18 2108 04/16/18 0744  GLUCAP 280* 261* 176* 144* 94    Microbiology: Results for orders placed or performed during the hospital encounter of 04/14/18  Culture, blood (Routine x 2)     Status: Abnormal (Preliminary result)   Collection Time: 04/14/18  1:44 PM  Result Value Ref Range Status   Specimen Description   Final    BLOOD BLOOD LEFT FOREARM Performed at [facility], [address], [place], [place] [address]    Special Requests   Final    BOTTLES DRAWN AEROBIC AND ANAEROBIC Blood Culture adequate volume Performed at [facility], [address], [place], [place] [address]    Culture  Setup Time   Final    GRAM NEGATIVE RODS IN BOTH AEROBIC AND ANAEROBIC BOTTLES CRITICAL RESULT CALLED TO, READ BACK BY AND VERIFIED WITH: DAVID BESANTI AT 9381 04/15/18.PMH Performed at [facility], [place] [address], [place], [place] [address]    Culture ESCHERICHIA COLI (A)  Final   Report Status PENDING  Incomplete  Culture, blood (Routine x 2)     Status: None (Preliminary result)   Collection Time: 04/14/18  1:44 PM  Result Value Ref Range Status   Specimen Description BLOOD BLOOD RIGHT FOREARM  Final   Special Requests   Final    BOTTLES DRAWN AEROBIC AND ANAEROBIC Blood Culture adequate volume   Culture  Setup Time   Final    GRAM NEGATIVE RODS IN BOTH AEROBIC AND ANAEROBIC  BOTTLES CRITICAL RESULT CALLED TO, READ BACK BY AND VERIFIED WITH: DAVID BESANTI AT 0258 04/15/18.PMH Performed at [facility], [address] [place], [place] [address]    Culture GRAM NEGATIVE RODS  Final   Report Status PENDING  Incomplete  Blood Culture ID Panel (Reflexed)     Status: Abnormal   Collection Time: 04/14/18  1:44 PM  Result Value Ref Range Status   Enterococcus species NOT DETECTED NOT DETECTED Final   Vancomycin resistance NOT DETECTED NOT DETECTED Final   Listeria monocytogenes NOT DETECTED NOT DETECTED Final   Staphylococcus species NOT DETECTED NOT DETECTED Final   Staphylococcus aureus NOT DETECTED NOT DETECTED Final   Methicillin resistance NOT DETECTED NOT DETECTED Final   Streptococcus species NOT DETECTED NOT DETECTED Final   Streptococcus agalactiae NOT DETECTED NOT DETECTED Final   Streptococcus pneumoniae NOT DETECTED NOT DETECTED Final   Streptococcus pyogenes NOT DETECTED NOT DETECTED Final   Acinetobacter baumannii NOT DETECTED NOT DETECTED Final   Enterobacteriaceae species DETECTED (A) NOT DETECTED Final    Comment: CRITICAL RESULT CALLED TO, READ BACK BY AND VERIFIED WITH: DAVID BESANTI AT 4174 04/15/18.PMH Enterobacteriaceae represent a large family of gram-negative bacteria, not a single organism.    Enterobacter cloacae complex NOT DETECTED NOT DETECTED Final   Escherichia coli DETECTED (A) NOT DETECTED Final    Comment: CRITICAL RESULT CALLED TO, READ BACK BY AND VERIFIED WITH: DAVID BESANTI AT 0814 04/15/18.PMH    Klebsiella oxytoca NOT DETECTED NOT DETECTED Final   Klebsiella pneumoniae NOT DETECTED NOT DETECTED Final   Proteus species NOT DETECTED NOT DETECTED Final   Serratia marcescens NOT DETECTED NOT DETECTED Final   Carbapenem resistance NOT DETECTED NOT DETECTED Final   Haemophilus influenzae NOT DETECTED NOT DETECTED Final   Neisseria meningitidis NOT DETECTED NOT DETECTED Final   Pseudomonas aeruginosa NOT DETECTED NOT  DETECTED Final   Candida albicans NOT DETECTED NOT DETECTED Final   Candida glabrata NOT DETECTED NOT DETECTED Final   Candida krusei NOT DETECTED NOT DETECTED Final   Candida parapsilosis NOT DETECTED NOT DETECTED Final   Candida tropicalis NOT DETECTED NOT DETECTED Final    Comment: Performed at [facility], [address] [place], [place] [address]  MRSA PCR Screening     Status: Abnormal   Collection Time: 04/16/18  5:47 AM  Result Value Ref Range Status   MRSA by PCR POSITIVE (A) NEGATIVE Final    Comment:        The GeneXpert MRSA Assay (FDA approved for NASAL specimens only), is one component of a comprehensive MRSA colonization surveillance program. It is not intended to diagnose MRSA infection nor to guide or monitor treatment for MRSA infections. RESULT CALLED TO, READ BACK BY AND VERIFIED WITH: OLIVIA ROGERS AT 6314 ON 04/16/18 [place]. Performed at [facility], [address] [place], [place] [address]     Coagulation Studies: Recent Labs    04/14/18 1343 04/15/18 0410 04/16/18 0434  LABPROT 21.5* 23.5* 26.8*  INR 1.89 2.11 2.50    Urinalysis: Recent Labs    04/14/18 1344  COLORURINE YELLOW*  LABSPEC 1.012  PHURINE 5.0  GLUCOSEU NEGATIVE  HGBUR SMALL*  BILIRUBINUR NEGATIVE  KETONESUR NEGATIVE  PROTEINUR 100*  NITRITE NEGATIVE  LEUKOCYTESUR LARGE*      Imaging: Dg Chest 2 View  Result Date: 04/14/2018 CLINICAL DATA:  Patient reports fever, rapid heart rate and SOB onset today. Denies cough or CP. Hx afib, CHF, CABG, GERD, HTN, DM. Non-smoker. EXAM: CHEST - 2 VIEW COMPARISON:  03/09/2018 FINDINGS: Patient has LEFT-sided transvenous pacemaker with leads to the RIGHT ventricle. Heart size is mildly enlarged. There is mild prominence of interstitial markings at the bases. No pleural effusions or alveolar edema. IMPRESSION: 1. Mild cardiomegaly. 2.  Mild bilateral LOWER lobe opacities may represent early edema or infiltrates.  Electronically Signed   By: Nolon Nations M.D.   On: 04/14/2018 14:27      Assessment & Plan: [name] is a 72 y.o. [place] female with atrial fibrillation, hypertension, diabetes mellitus type II insulin dependent, diabetic neuropathy, congestive heart failure, coronary artery disease status post CABG, GERD, CLL, bilateral below the knee amputations, who was admitted to [facility] on 04/14/2018 for Sepsis, due to unspecified organism ([place]) [A41.9] Community acquired pneumonia, unspecified laterality [J18.9]  1. Acute renal failure with hyperkalemia and hyponatremia on chronic kidney disease stage IV with proteinuria: baseline creatinine of 2.55, GFR of 18 on 02/2018 Acute renal failure secondary to ATN, prerenal azotemia and sepsis Chronic kidney disease secondary to diabetic nephropathy, hypertension.  - Holding losartan and furosemide - Continue IV fluids - Check renal ultrasound - Low potassium diet.  2. Hypertension: with history of congestive heart failure systolic with EF of 33-29% on echo 10/09/16.  Irregular heart beat on examination: atrial fibrillation - Continue isosorbide mononitrate, carvedilol  3. Urinary tract infection: E. Coli.  - empiric meropenem and vanco  4. 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  - followed by hematology at [facility]  5. Diabetes mellitus type II with chronic kidney disease: insulin dependent. Hemoglobin A1c 9.1% on 03/07/18.    LOS: 2 Caroline Hernandez 8/6/201910:31 AM

## 2018-04-16 NOTE — Progress Notes (Signed)
Dr. Coy Saunas notified of critical WBC 52.8; acknowledged; will leave note for am [place] MD for hematology consult. Barbaraann Faster, RN; 5:07 AM 04/16/2018

## 2018-04-16 NOTE — Progress Notes (Signed)
[facility] [place] NAME: Caroline Hernandez    MR#:  [id number]  DATE OF BIRTH:  January 04, 1946  SUBJECTIVE:  Feeling better today. No fevers/chills. Only having a mild cough. Shortness of breath has improved.  REVIEW OF SYSTEMS:  Review of Systems  Constitutional: Negative for chills and fever.  HENT: Negative for congestion and sore throat.   Eyes: Negative for blurred vision and double vision.  Respiratory: Negative for cough and shortness of breath.   Cardiovascular: Negative for chest pain and palpitations.  Gastrointestinal: Negative for nausea and vomiting.  Genitourinary: Negative for dysuria, frequency and urgency.  Musculoskeletal: Negative for back pain and neck pain.  Neurological: Negative for dizziness and headaches.    DRUG ALLERGIES:   Allergies  Allergen Reactions  . Zosyn [Piperacillin Sod-Tazobactam So] Other (See Comments)    Renal failure   VITALS:  Blood pressure 131/63, pulse 61, temperature 98.4 F (36.9 C), temperature source Oral, resp. rate 20, height 5\' 8"  (1.727 m), weight 95.7 kg (211 lb), SpO2 95 %. PHYSICAL EXAMINATION:  Physical Exam  GENERAL:  72 y.o.-year-old patient lying in the bed with no acute distress.  EYES: Pupils equal, round, reactive to light and accommodation. No scleral icterus. Extraocular muscles intact.  HEENT: Head atraumatic, normocephalic. Oropharynx and nasopharynx clear. MMM.  NECK:  Supple, no jugular venous distention. No thyroid enlargement, no tenderness.  LUNGS: Normal work of breathing. +crackles in the left lung base, [place] in place. CARDIOVASCULAR: Irregularly irregular rhythm, bradycardic. No murmurs, rubs, or gallops.  ABDOMEN: Soft, nontender, nondistended. Bowel sounds present. No organomegaly or mass.  EXTREMITIES: No edema, cyanosis, or clubbing.  Bilateral BKA's. NEUROLOGIC: Cranial nerves II through XII are intact. No focal weakness. Gait not checked.  PSYCHIATRIC: The  patient is alert and oriented x 3.  SKIN: No obvious rash, lesion, or ulcer.   LABORATORY PANEL:  Female CBC Recent Labs  Lab 04/16/18 0434  WBC 52.8*  HGB 9.6*  HCT 30.3*  PLT 162   ------------------------------------------------------------------------------------------------------------------ Chemistries  Recent Labs  Lab 04/14/18 1343  04/16/18 0434  NA 137   < > 135  K 5.0   < > 5.3*  CL 102   < > 104  CO2 24   < > 21*  GLUCOSE 119*   < > 120*  BUN 41*   < > 68*  CREATININE 3.35*   < > 4.64*  CALCIUM 8.5*   < > 7.6*  AST 22  --   --   ALT 12  --   --   ALKPHOS 74  --   --   BILITOT 1.8*  --   --    < > = values in this interval not displayed.   RADIOLOGY:  US Renal  Result Date: 04/16/2018 CLINICAL DATA:  Acute renal failure. EXAM: RENAL / URINARY TRACT ULTRASOUND COMPLETE COMPARISON:  CT 03/07/2018. FINDINGS: Right Kidney: Length: 11.3 cm. Echogenicity within normal limits. Probable vascular calcifications. Nonobstructing calyceal stones cannot be excluded. No mass or hydronephrosis visualized. Left Kidney: Length: 11.6 cm. Echogenicity within normal limits. Probable vascular calcifications. Nonobstructing calyceal stones cannot be excluded. No mass or hydronephrosis visualized. Bladder: 3.9 cm echogenic mass noted along the posterior bladder wall. Bladder tumor cannot be excluded. Urologic evaluation should be considered. Questionable bladder diverticulum also noted. IMPRESSION: 1. Probable bilateral renal vascular calcifications suggesting renal vascular disease. Nonobstructing calyceal stones cannot be excluded. No acute renal abnormality. No hydronephrosis. 2. 3.9 cm echogenic  mass noted along the posterior bladder wall. Bladder tumor cannot be excluded. Urologic evaluation suggested. Questionable bladder diverticulum also noted. Electronically Signed   By: Marcello Moores  Register   On: 04/16/2018 12:27   ASSESSMENT AND PLAN:   *E. Coli bacteremia 2/2 CAP- compounded by  immunosuppression from CLL. Remains on 2L O2 by [place]. UA on admission with large leukocytes and many bacteria. - initially on rocephin/azithromycin, changed to vanc/mero (zosyn allergy). Will stop vancomycin based on her blood cultures and her worsening renal function. Continue meropenem. - awaiting blood culture susceptibilities - repeat blood cultures drawn today - urine culture pending - wean O2 as able  *Acute renal failure with chronic kidney disease stage IV- likely due to dehydration. Likely also worsened by vancomycin. - nephrology consulted - stop vancomycin - hold losartan - Avoid nephrotoxic agents - strict I&O - continue IV fluids - recheck bmp in the morning  *?Bladder mass- renal US with 3.9cm echogenic mass along the posterior bladder wall. - needs outpatient urology f/u for further evaluation  *History of CLL- follows with Dr. Selena Batten [facility]) and has appointment scheduled for 8/8. WBCs 52.8 today. Expect increase in WBCs 2/2 infection. Baseline WBC in the 30s. - has f/u appt scheduled for 8/8 - monitor WBCs  *Hypertension- BP normal this morning - hold norvasc and losartan - continue coreg - continue imdur at decreased dose of 30mg  bid  *Chronic diabetes mellitus type 2- blood sugars well controlled - continue lantus 38 units in the morning and 14 units in the evening - continue resistant SSI  *Chronic atrial fibrillation- rate-controlled, INR therapeutic. - continue coumadin - continue coreg  *History of systolic congestive heart failure- no signs of fluid overload. Last ECHO with EF 40-45%. - continue coreg and statin - hold losartan for AKI - on IVFs, will monitor volume status closely  *Chronic morbid obesity - discussed lifestyle modification  *Chronic GERD without esophagitis - PPI daily  All the records are reviewed and case discussed with Care Management/Social Worker. Management plans discussed with the patient, family and they are in  agreement.  CODE STATUS: Full Code  TOTAL TIME TAKING CARE OF THIS PATIENT: 30 minutes.   More than 50% of the time was spent in counseling/coordination of care: YES  POSSIBLE D/C IN 1-2 DAYS, DEPENDING ON CLINICAL CONDITION and ability to wean off O2.   Berna Spare Mayo M.D on 04/16/2018 at 12:47 PM  Between 7am to 6pm - Pager - [phone]  After 6pm go to [email or web address] - Proofreader  [facility]  Office  [phone]  CC: Primary care physician; System, Provider Not In  Note: This dictation was prepared with Dragon dictation along with smaller phrase technology. Any transcriptional errors that result from this process are unintentional.

## 2018-04-16 NOTE — Progress Notes (Signed)
ANTICOAGULATION CONSULT NOTE - Initial Consult  Pharmacy Consult for warfarin Indication: atrial fibrillation  Allergies  Allergen Reactions  . Zosyn [Piperacillin Sod-Tazobactam So] Other (See Comments)    Renal failure    Patient Measurements: Height: 5\' 8"  (172.7 cm) Weight: 211 lb (95.7 kg) IBW/kg (Calculated) : 63.9   Vital Signs: Temp: 98.4 F (36.9 C) (08/06 0530) Temp Source: Oral (08/06 0530) BP: 131/63 (08/06 0530) Pulse Rate: 61 (08/06 0530)  Labs: Recent Labs    04/14/18 1343 04/15/18 0410 04/15/18 1004 04/16/18 0434  HGB 10.9*  --  9.0* 9.6*  HCT 34.0*  --  28.4* 30.3*  PLT 167  --  133* 162  LABPROT 21.5* 23.5*  --  26.8*  INR 1.89 2.11  --  2.50  CREATININE 3.35*  --  4.07* 4.64*    Estimated Creatinine Clearance: 13.6 mL/min (A) (by C-G formula based on SCr of 4.64 mg/dL (H)).   Medical History: Past Medical History:  Diagnosis Date  . Amputation of left lower extremity below knee ([place])   . Amputation of right lower extremity below knee ([place])   . CAD (coronary artery disease)    a. 2004 Cardiac Arrest/CABG x 3 (LIMA->LAD, VG->OM, VG->RCA);  b. 06/2015 lexiscan MV: no significant ischemia, EF 48%, low risk->Med Rx.  . Chronic combined systolic and diastolic CHF (congestive heart failure) ([place])    a. 12/2014 Echo: EF 45-50%;  b. 08/2015 Echo: EF 45-50%, ant, antsept HK, mildly dil LA, nl RV, mild-mod TR, sev PAH (45mmHg).  . CKD (chronic kidney disease), stage IV ([place])   . CLL (chronic lymphocytic leukemia) ([place])   . Diabetes mellitus without complication ([place])   . Essential hypertension   . GERD (gastroesophageal reflux disease)   . Hiatal hernia   . History of cardiac arrest    a. 2004.  Marland Kitchen Hyperlipidemia   . Ischemic cardiomyopathy    a. s/p MDT ICD (originally had 5726 lead-->gen change and lead revision ~ 2012 @ [facility]); b. 12/2014 Echo: EF 45-50%;  c.08/2015 Echo: EF 45-50%, ant, antsept HK. Nl RV.  Marland Kitchen PAD (peripheral artery disease) ([facility])     a. s/p BKA  . Persistent atrial fibrillation ([place])    a. Dx 12/2014.  CHA2DS2VASc = 6--> warfarin;  b. 09/2015 s/p DCCV-->on amio; c. s/p DCCV 04/25/16    Assessment: 72 year old female admitted for CAP. Patient takes warfarin for afib. Pharmacy has been consulted for warfarin dosing. The patient's home dose is 6 mg on Mon, Tues, Thurs, Fri and Sat and 4 mg on Wed and Sun.  8/4  INR 1.89    Warfarin 6mg  8/5  INR 2.11    Warfarin 6mg  8/6  INR 2.50  Goal of Therapy:  INR 2-3 Monitor platelets by anticoagulation protocol: Yes   Plan:  Will order Warfarin 5mg  today. Daily INR.  Delphia Grates, PharmD, BCPS 04/16/2018 1:39 PM

## 2018-04-17 LAB — GLUCOSE, CAPILLARY
GLUCOSE-CAPILLARY: 99 mg/dL (ref 70–99)
GLUCOSE-CAPILLARY: 83 mg/dL (ref 70–99)
GLUCOSE-CAPILLARY: 86 mg/dL (ref 70–99)
Glucose-Capillary: 68 mg/dL — ABNORMAL LOW (ref 70–99)
Glucose-Capillary: 76 mg/dL (ref 70–99)

## 2018-04-17 LAB — RENAL FUNCTION PANEL
Albumin: 3.2 g/dL — ABNORMAL LOW (ref 3.5–5.0)
Anion gap: 9 (ref 5–15)
BUN: 67 mg/dL — ABNORMAL HIGH (ref 8–23)
CHLORIDE: 105 mmol/L (ref 98–111)
CO2: 22 mmol/L (ref 22–32)
CREATININE: 4.41 mg/dL — AB (ref 0.44–1.00)
Calcium: 7.6 mg/dL — ABNORMAL LOW (ref 8.9–10.3)
GFR, EST AFRICAN AMERICAN: 11 mL/min — AB (ref >60)
GFR, EST NON AFRICAN AMERICAN: 9 mL/min — AB (ref >60)
Glucose, Bld: 90 mg/dL (ref 70–99)
POTASSIUM: 4.9 mmol/L (ref 3.5–5.1)
Phosphorus: 5.8 mg/dL — ABNORMAL HIGH (ref 2.5–4.6)
Sodium: 136 mmol/L (ref 135–145)

## 2018-04-17 LAB — CBC
HEMATOCRIT: 29.7 % — AB (ref 35.0–47.0)
HEMOGLOBIN: 9.5 g/dL — AB (ref 12.0–16.0)
MCH: 28 pg (ref 26.0–34.0)
MCHC: 32.1 g/dL (ref 32.0–36.0)
MCV: 87.3 fL (ref 80.0–100.0)
PLATELETS: 163 10*3/uL (ref 150–440)
RBC: 3.4 MIL/uL — AB (ref 3.80–5.20)
RDW: 18 % — ABNORMAL HIGH (ref 11.5–14.5)
WBC: 47.3 10*3/uL — AB (ref 3.6–11.0)

## 2018-04-17 LAB — CULTURE, BLOOD (ROUTINE X 2)
Special Requests: ADEQUATE
Special Requests: ADEQUATE

## 2018-04-17 LAB — PROTIME-INR
INR: 2.45
Prothrombin Time: 26.4 seconds — ABNORMAL HIGH (ref 11.4–15.2)

## 2018-04-17 MED ORDER — MUPIROCIN 2 % EX OINT
1.0000 "application " | TOPICAL_OINTMENT | Freq: Two times a day (BID) | CUTANEOUS | Status: DC
  Administered 2018-04-17 – 2018-04-19 (×5): 1 via NASAL
  Filled 2018-04-17: qty 22

## 2018-04-17 MED ORDER — CHLORHEXIDINE GLUCONATE CLOTH 2 % EX PADS
6.0000 | MEDICATED_PAD | Freq: Every day | CUTANEOUS | Status: DC
  Administered 2018-04-18 – 2018-04-19 (×2): 6 via TOPICAL

## 2018-04-17 MED ORDER — WARFARIN SODIUM 5 MG PO TABS
5.0000 mg | ORAL_TABLET | Freq: Once | ORAL | Status: AC
  Administered 2018-04-17: 5 mg via ORAL
  Filled 2018-04-17: qty 1

## 2018-04-17 MED ORDER — INSULIN GLARGINE 100 UNIT/ML ~~LOC~~ SOLN
25.0000 [IU] | Freq: Every day | SUBCUTANEOUS | Status: DC
  Administered 2018-04-18 – 2018-04-19 (×2): 25 [IU] via SUBCUTANEOUS
  Filled 2018-04-17 (×3): qty 0.25

## 2018-04-17 MED ORDER — CEFDINIR 300 MG PO CAPS
300.0000 mg | ORAL_CAPSULE | Freq: Every day | ORAL | Status: DC
  Filled 2018-04-17: qty 1

## 2018-04-17 MED ORDER — CEFDINIR 300 MG PO CAPS
300.0000 mg | ORAL_CAPSULE | Freq: Every day | ORAL | Status: DC
  Administered 2018-04-17 – 2018-04-18 (×2): 300 mg via ORAL
  Filled 2018-04-17 (×3): qty 1

## 2018-04-17 NOTE — Progress Notes (Signed)
ANTICOAGULATION CONSULT NOTE - Initial Consult  Pharmacy Consult for warfarin Indication: atrial fibrillation  Allergies  Allergen Reactions  . Zosyn [Piperacillin Sod-Tazobactam So] Other (See Comments)    Renal failure    Patient Measurements: Height: 5\' 8"  (172.7 cm) Weight: 211 lb (95.7 kg) IBW/kg (Calculated) : 63.9   Vital Signs: Temp: 98 F (36.7 C) (08/07 0357) Temp Source: Oral (08/07 0357) BP: 113/56 (08/07 0357) Pulse Rate: 56 (08/07 0357)  Labs: Recent Labs    04/15/18 0410 04/15/18 1004 04/16/18 0434 04/17/18 0334  HGB  --  9.0* 9.6* 9.5*  HCT  --  28.4* 30.3* 29.7*  PLT  --  133* 162 163  LABPROT 23.5*  --  26.8* 26.4*  INR 2.11  --  2.50 2.45  CREATININE  --  4.07* 4.64* 4.41*    Estimated Creatinine Clearance: 14.4 mL/min (A) (by C-G formula based on SCr of 4.41 mg/dL (H)).   Medical History: Past Medical History:  Diagnosis Date  . Amputation of left lower extremity below knee ([place])   . Amputation of right lower extremity below knee ([place])   . CAD (coronary artery disease)    a. 2004 Cardiac Arrest/CABG x 3 (LIMA->LAD, VG->OM, VG->RCA);  b. 06/2015 lexiscan MV: no significant ischemia, EF 48%, low risk->Med Rx.  . Chronic combined systolic and diastolic CHF (congestive heart failure) ([place])    a. 12/2014 Echo: EF 45-50%;  b. 08/2015 Echo: EF 45-50%, ant, antsept HK, mildly dil LA, nl RV, mild-mod TR, sev PAH (58mmHg).  . CKD (chronic kidney disease), stage IV ([place])   . CLL (chronic lymphocytic leukemia) ([place])   . Diabetes mellitus without complication ([place])   . Essential hypertension   . GERD (gastroesophageal reflux disease)   . Hiatal hernia   . History of cardiac arrest    a. 2004.  Marland Kitchen Hyperlipidemia   . Ischemic cardiomyopathy    a. s/p MDT ICD (originally had 5732 lead-->gen change and lead revision ~ 2012 @ [place]); b. 12/2014 Echo: EF 45-50%;  c.08/2015 Echo: EF 45-50%, ant, antsept HK. Nl RV.  Marland Kitchen PAD (peripheral artery disease) (H[facility])    a. s/p BKA  . Persistent atrial fibrillation ([place])    a. Dx 12/2014.  CHA2DS2VASc = 6--> warfarin;  b. 09/2015 s/p DCCV-->on amio; c. s/p DCCV 04/25/16    Assessment: 72 year old female admitted for CAP. Patient takes warfarin for afib. Pharmacy has been consulted for warfarin dosing. The patient's home dose is 6 mg on Mon, Tues, Thurs, Fri and Sat and 4 mg on Wed and Sun.  8/4  INR 1.89    Warfarin 6mg  8/5  INR 2.11    Warfarin 6mg  8/6  INR 2.50    Warfarin 5mg  8/7  INR 2.45  Goal of Therapy:  INR 2-3 Monitor platelets by anticoagulation protocol: Yes   Plan:  Will order Warfarin 5mg  today. Daily INR.  Delphia Grates, PharmD, BCPS 04/17/2018 9:26 AM

## 2018-04-17 NOTE — Progress Notes (Signed)
[facility]  ROUNDING NOTE   Subjective:   Daughter at bedside   Patient states she is doing well and has no complaints.   Creatinine 4.41 (4.62)  NS at 165mL/hr  Objective:  Vital signs in last 24 hours:  Temp:  [97.7 F (36.5 C)-98 F (36.7 C)] 98 F (36.7 C) (08/07 0357) Pulse Rate:  [54-66] 56 (08/07 0357) Resp:  [19-21] 21 (08/07 0357) BP: (109-125)/(56-78) 113/56 (08/07 0357) SpO2:  [96 %-97 %] 96 % (08/07 0357)  Weight change:  Filed Weights   04/14/18 1320  Weight: 95.7 kg (211 lb)    Intake/Output: I/O last 3 completed shifts: In: 1125 [I.V.:925; IV Piggyback:200] Out: 0    Intake/Output this shift:  No intake/output data recorded.  Physical Exam: General: NAD, sitting up in bed  Head: Normocephalic, atraumatic. Moist oral mucosal membranes  Eyes: Anicteric, PERRL  Neck: Supple, trachea midline  Lungs:  Clear to auscultation  Heart: Regular rate and rhythm  Abdomen:  Soft, nontender,   Extremities:  bilateral below the knee amputations, no edema  Neurologic: Nonfocal, moving all four extremities  Skin: No lesions        Basic Metabolic Panel: Recent Labs  Lab 04/14/18 1343 04/15/18 1004 04/16/18 0434 04/17/18 0334  NA 137 133* 135 136  K 5.0 5.9* 5.3* 4.9  CL 102 102 104 105  CO2 24 20* 21* 22  GLUCOSE 119* 329* 120* 90  BUN 41* 59* 68* 67*  CREATININE 3.35* 4.07* 4.64* 4.41*  CALCIUM 8.5* 7.4* 7.6* 7.6*  PHOS  --   --   --  5.8*    Liver Function Tests: Recent Labs  Lab 04/14/18 1343 04/17/18 0334  AST 22  --   ALT 12  --   ALKPHOS 74  --   BILITOT 1.8*  --   PROT 7.3  --   ALBUMIN 3.7 3.2*   No results for input(s): LIPASE, AMYLASE in the last 168 hours. No results for input(s): AMMONIA in the last 168 hours.  CBC: Recent Labs  Lab 04/14/18 1343 04/15/18 1004 04/16/18 0434 04/17/18 0334  WBC 42.4* 43.6* 52.8* 47.3*  NEUTROABS 15.3*  --   --   --   HGB 10.9* 9.0* 9.6* 9.5*  HCT 34.0* 28.4* 30.3*  29.7*  MCV 85.7 86.9 86.5 87.3  PLT 167 133* 162 163    Cardiac Enzymes: No results for input(s): CKTOTAL, CKMB, CKMBINDEX, TROPONINI in the last 168 hours.  BNP: Invalid input(s): POCBNP  CBG: Recent Labs  Lab 04/16/18 1310 04/16/18 1644 04/16/18 2137 04/17/18 0739 04/17/18 0900  GLUCAP 71 102* 101* 68* 99    Microbiology: Results for orders placed or performed during the hospital encounter of 04/14/18  Culture, blood (Routine x 2)     Status: Abnormal   Collection Time: 04/14/18  1:44 PM  Result Value Ref Range Status   Specimen Description   Final    BLOOD BLOOD LEFT FOREARM Performed at [facility], [address] [place], [place] [address]    Special Requests   Final    BOTTLES DRAWN AEROBIC AND ANAEROBIC Blood Culture adequate volume Performed at [facility], [address] [place], [place] [address]    Culture  Setup Time   Final    GRAM NEGATIVE RODS IN BOTH AEROBIC AND ANAEROBIC BOTTLES CRITICAL RESULT CALLED TO, READ BACK BY AND VERIFIED WITH: DAVID BESANTI AT 9767 04/15/18.PMH Performed at [facility], [employer] [address], [place], [place]  [address]    Culture ESCHERICHIA COLI (A)  Final   Report Status 04/17/2018 FINAL  Final   Organism ID, Bacteria ESCHERICHIA COLI  Final      Susceptibility   Escherichia coli - MIC*    AMPICILLIN <=2 SENSITIVE Sensitive     CEFAZOLIN <=4 SENSITIVE Sensitive     CEFEPIME <=1 SENSITIVE Sensitive     CEFTAZIDIME <=1 SENSITIVE Sensitive     CEFTRIAXONE <=1 SENSITIVE Sensitive     CIPROFLOXACIN <=0.25 SENSITIVE Sensitive     GENTAMICIN <=1 SENSITIVE Sensitive     IMIPENEM <=0.25 SENSITIVE Sensitive     TRIMETH/SULFA <=20 SENSITIVE Sensitive     AMPICILLIN/SULBACTAM <=2 SENSITIVE Sensitive     PIP/TAZO <=4 SENSITIVE Sensitive     Extended ESBL NEGATIVE Sensitive     * ESCHERICHIA COLI  Culture, blood (Routine x 2)     Status: Abnormal   Collection Time: 04/14/18  1:44 PM  Result Value Ref  Range Status   Specimen Description   Final    BLOOD BLOOD RIGHT FOREARM Performed at [facility], [address], [place], [place] [address]    Special Requests   Final    BOTTLES DRAWN AEROBIC AND ANAEROBIC Blood Culture adequate volume Performed at [facility], [address] [place], [place] [address]    Culture  Setup Time   Final    GRAM NEGATIVE RODS IN BOTH AEROBIC AND ANAEROBIC BOTTLES CRITICAL RESULT CALLED TO, READ BACK BY AND VERIFIED WITH: DAVID BESANTI AT 9629 04/15/18.PMH Performed at [facility], [address] [place], [place] [address]    Culture (A)  Final    ESCHERICHIA COLI SUSCEPTIBILITIES PERFORMED ON PREVIOUS CULTURE WITHIN THE LAST 5 DAYS. Performed at [facility], [place] [address] [place], [place] [address]    Report Status 04/17/2018 FINAL  Final  Blood Culture ID Panel (Reflexed)     Status: Abnormal   Collection Time: 04/14/18  1:44 PM  Result Value Ref Range Status   Enterococcus species NOT DETECTED NOT DETECTED Final   Vancomycin resistance NOT DETECTED NOT DETECTED Final   Listeria monocytogenes NOT DETECTED NOT DETECTED Final   Staphylococcus species NOT DETECTED NOT DETECTED Final   Staphylococcus aureus NOT DETECTED NOT DETECTED Final   Methicillin resistance NOT DETECTED NOT DETECTED Final   Streptococcus species NOT DETECTED NOT DETECTED Final   Streptococcus agalactiae NOT DETECTED NOT DETECTED Final   Streptococcus pneumoniae NOT DETECTED NOT DETECTED Final   Streptococcus pyogenes NOT DETECTED NOT DETECTED Final   Acinetobacter baumannii NOT DETECTED NOT DETECTED Final   Enterobacteriaceae species DETECTED (A) NOT DETECTED Final    Comment: CRITICAL RESULT CALLED TO, READ BACK BY AND VERIFIED WITH: DAVID BESANTI AT 1027 04/15/18.PMH Enterobacteriaceae represent a large family of gram-negative bacteria, not a single organism.    Enterobacter cloacae complex NOT DETECTED NOT DETECTED Final    Escherichia coli DETECTED (A) NOT DETECTED Final    Comment: CRITICAL RESULT CALLED TO, READ BACK BY AND VERIFIED WITH: DAVID BESANTI AT 2536 04/15/18.PMH    Klebsiella oxytoca NOT DETECTED NOT DETECTED Final   Klebsiella pneumoniae NOT DETECTED NOT DETECTED Final   Proteus species NOT DETECTED NOT DETECTED Final   Serratia marcescens NOT DETECTED NOT DETECTED Final   Carbapenem resistance NOT DETECTED NOT DETECTED Final   Haemophilus influenzae NOT DETECTED NOT DETECTED Final   Neisseria meningitidis NOT DETECTED NOT DETECTED Final   Pseudomonas aeruginosa NOT DETECTED NOT DETECTED Final   Candida  albicans NOT DETECTED NOT DETECTED Final   Candida glabrata NOT DETECTED NOT DETECTED Final   Candida krusei NOT DETECTED NOT DETECTED Final   Candida parapsilosis NOT DETECTED NOT DETECTED Final   Candida tropicalis NOT DETECTED NOT DETECTED Final    Comment: Performed at [facility] [address] [place], [place] [address]  MRSA PCR Screening     Status: Abnormal   Collection Time: 04/16/18  5:47 AM  Result Value Ref Range Status   MRSA by PCR POSITIVE (A) NEGATIVE Final    Comment:        The GeneXpert MRSA Assay (FDA approved for NASAL specimens only), is one component of a comprehensive MRSA colonization surveillance program. It is not intended to diagnose MRSA infection nor to guide or monitor treatment for MRSA infections. RESULT CALLED TO, READ BACK BY AND VERIFIED WITH: OLIVIA ROGERS AT 3295 ON 04/16/18 [place]. Performed at [facility], [address] [place], [place] [address]   CULTURE, BLOOD (ROUTINE X 2) w Reflex to ID Panel     Status: None (Preliminary result)   Collection Time: 04/16/18  1:18 PM  Result Value Ref Range Status   Specimen Description BLOOD RIGHT HAND  Final   Special Requests   Final    BOTTLES DRAWN AEROBIC AND ANAEROBIC Blood Culture adequate volume   Culture   Final    NO GROWTH < 24 HOURS Performed at [facility],  [address], [place], [place] [address]    Report Status PENDING  Incomplete  CULTURE, BLOOD (ROUTINE X 2) w Reflex to ID Panel     Status: None (Preliminary result)   Collection Time: 04/16/18  1:18 PM  Result Value Ref Range Status   Specimen Description BLOOD LEFT HAND  Final   Special Requests   Final    BOTTLES DRAWN AEROBIC AND ANAEROBIC Blood Culture adequate volume   Culture   Final    NO GROWTH < 24 HOURS Performed at [facility], [address] [place],  [address]    Report Status PENDING  Incomplete    Coagulation Studies: Recent Labs    04/14/18 1343 04/15/18 0410 04/16/18 0434 04/17/18 0334  LABPROT 21.5* 23.5* 26.8* 26.4*  INR 1.89 2.11 2.50 2.45    Urinalysis: Recent Labs    04/14/18 1344  COLORURINE YELLOW*  LABSPEC 1.012  PHURINE 5.0  GLUCOSEU NEGATIVE  HGBUR SMALL*  BILIRUBINUR NEGATIVE  KETONESUR NEGATIVE  PROTEINUR 100*  NITRITE NEGATIVE  LEUKOCYTESUR LARGE*      Imaging: US Renal  Result Date: 04/16/2018 CLINICAL DATA:  Acute renal failure. EXAM: RENAL / URINARY TRACT ULTRASOUND COMPLETE COMPARISON:  CT 03/07/2018. FINDINGS: Right Kidney: Length: 11.3 cm. Echogenicity within normal limits. Probable vascular calcifications. Nonobstructing calyceal stones cannot be excluded. No mass or hydronephrosis visualized. Left Kidney: Length: 11.6 cm. Echogenicity within normal limits. Probable vascular calcifications. Nonobstructing calyceal stones cannot be excluded. No mass or hydronephrosis visualized. Bladder: 3.9 cm echogenic mass noted along the posterior bladder wall. Bladder tumor cannot be excluded. Urologic evaluation should be considered. Questionable bladder diverticulum also noted. IMPRESSION: 1. Probable bilateral renal vascular calcifications suggesting renal vascular disease. Nonobstructing calyceal stones cannot be excluded. No acute renal abnormality. No hydronephrosis. 2. 3.9 cm echogenic mass noted along the posterior  bladder wall. Bladder tumor cannot be excluded. Urologic evaluation suggested. Questionable bladder diverticulum also noted. Electronically Signed   By: Marcello Moores  Register   On: 04/16/2018 12:27     Medications:   .  sodium chloride 100 mL/hr at 04/17/18 0133  . meropenem (MERREM) IV Stopped (04/17/18 0202)   . amitriptyline  25 mg Oral QHS  . atorvastatin  40 mg Oral QHS  . carvedilol  3.125 mg Oral BID  . Chlorhexidine Gluconate Cloth  6 each Topical Q0600  . cholecalciferol  4,000 Units Oral Daily  . gabapentin  100 mg Oral BID  . insulin aspart  0-20 Units Subcutaneous TID WC  . insulin aspart  0-5 Units Subcutaneous QHS  . insulin glargine  14 Units Subcutaneous QAC supper  . insulin glargine  25 Units Subcutaneous QAC breakfast  . iron polysaccharides  150 mg Oral Daily  . isosorbide mononitrate  30 mg Oral BID  . multivitamin-lutein  1 capsule Oral BID  . mupirocin ointment  1 application Nasal BID  . pantoprazole  40 mg Oral QHS  . sertraline  100 mg Oral QHS  . vitamin C  500 mg Oral Daily  . warfarin  5 mg Oral ONCE-1800  . Warfarin - Pharmacist Dosing Inpatient   Does not apply q1800   albuterol, alum & mag hydroxide-simeth, nitroGLYCERIN  Assessment/ Plan:  [name] is a 72 y.o.  female  [name] is a 72 y.o. [place] female with atrial fibrillation, hypertension, diabetes mellitus type II insulin dependent, diabetic neuropathy, congestive heart failure, coronary artery disease status post CABG, GERD, CLL, bilateral below the knee amputations, who was admitted to [facility] on 04/14/2018 for Sepsis, due to unspecified organism ([place]) [A41.9] Community acquired pneumonia, unspecified laterality [J18.9]  1. Acute renal failure with hyperkalemia and hyponatremia on chronic kidney disease stage IV with proteinuria: baseline creatinine of 2.55, GFR of 18 on 02/2018 Acute renal failure secondary to ATN, prerenal azotemia and sepsis Chronic kidney disease secondary to  diabetic nephropathy, hypertension.  Ultrasound reviewed with patient.  - Holding losartan and furosemide - Continue IV fluids - Low potassium diet.  2. Hypertension: with history of congestive heart failure systolic with EF of 92-42% on echo 10/09/16.  Irregular heart beat on examination: atrial fibrillation - Continue isosorbide mononitrate, carvedilol  3. Urinary tract infection: E. Coli. pansensitive - empiric meropenem.   4. 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  - followed by hematology at [facility]  5. Diabetes mellitus type II with chronic kidney disease: insulin dependent. Hemoglobin A1c 9.1% on 03/07/18.   6. Bladder mass: found on ultrasound  - consider urology consultation.    LOS: 3 Keely Drennan 8/7/20199:52 AM

## 2018-04-17 NOTE — Progress Notes (Addendum)
[place] [facility] [place] NAME: Caroline Hernandez    MR#:  [id number]  DATE OF BIRTH:  [birth year]  SUBJECTIVE:  Continues to feel better. Shortness of breath improving. No fevers, no chills.  REVIEW OF SYSTEMS:  Review of Systems  Constitutional: Negative for chills and fever.  HENT: Negative for congestion and sore throat.   Eyes: Negative for blurred vision and double vision.  Respiratory: Negative for cough and shortness of breath.   Cardiovascular: Negative for chest pain and palpitations.  Gastrointestinal: Negative for nausea and vomiting.  Genitourinary: Negative for dysuria, frequency and urgency.  Musculoskeletal: Negative for back pain and neck pain.  Neurological: Negative for dizziness and headaches.    DRUG ALLERGIES:   Allergies  Allergen Reactions  . Zosyn [Piperacillin Sod-Tazobactam So] Other (See Comments)    Renal failure   VITALS:  Blood pressure (!) 142/93, pulse 67, temperature 97.9 F (36.6 C), temperature source Oral, resp. rate 16, height 5\' 8"  (1.727 m), weight 95.7 kg (211 lb), SpO2 98 %. PHYSICAL EXAMINATION:  Physical Exam  GENERAL:  72 y.o.-year-old patient lying in the bed with no acute distress, pleasant EYES: Pupils equal, round, reactive to light and accommodation. No scleral icterus. Extraocular muscles intact.  HEENT: Head atraumatic, normocephalic. Oropharynx and nasopharynx clear. MMM.  NECK:  Supple, no jugular venous distention. No thyroid enlargement, no tenderness.  LUNGS: Normal work of breathing. Faint crackles in the left lung base, Fowler in place. CARDIOVASCULAR: Irregularly irregular rhythm, bradycardic. No murmurs, rubs, or gallops.  ABDOMEN: Soft, nontender, nondistended. Bowel sounds present. No organomegaly or mass.  EXTREMITIES: No edema, cyanosis, or clubbing.  Bilateral BKA's. NEUROLOGIC: Cranial nerves II through XII are intact. No focal weakness. Gait not checked.  PSYCHIATRIC: The  patient is alert and oriented x 3.  SKIN: No obvious rash, lesion, or ulcer.   LABORATORY PANEL:  Female CBC Recent Labs  Lab 04/17/18 0334  WBC 47.3*  HGB 9.5*  HCT 29.7*  PLT 163   ------------------------------------------------------------------------------------------------------------------ Chemistries  Recent Labs  Lab 04/14/18 1343  04/17/18 0334  NA 137   < > 136  K 5.0   < > 4.9  CL 102   < > 105  CO2 24   < > 22  GLUCOSE 119*   < > 90  BUN 41*   < > 67*  CREATININE 3.35*   < > 4.41*  CALCIUM 8.5*   < > 7.6*  AST 22  --   --   ALT 12  --   --   ALKPHOS 74  --   --   BILITOT 1.8*  --   --    < > = values in this interval not displayed.   RADIOLOGY:  No results found. ASSESSMENT AND PLAN:   *E. Coli bacteremia 2/2 CAP- compounded by immunosuppression from CLL. Remains on 2L O2 by [place]. UA on admission with large leukocytes and many bacteria, so bacteremia could be secondary to UTI (has had recent E. Coli UTIs), but urine culture never obtained on admission. - change from meropenem to omnicef, given blood culture sensitivity results and that it covers the lungs and the urine - repeat blood cultures pending - wean O2 as able- currently on 1L  *Acute renal failure with chronic kidney disease stage IV- likely due to dehydration. Likely also worsened by vancomycin. Cr improving today. - nephrology consulted - stop vancomycin - hold losartan - Avoid nephrotoxic agents - strict I&O -  continue IV fluids - recheck bmp in the morning  *?Bladder mass- renal US with 3.9cm echogenic mass along the posterior bladder wall. - needs outpatient urology f/u for further evaluation- discussed with patient and daughter  *History of CLL- follows with Dr. Selena Batten [facility]) and has appointment scheduled for 8/8. Expect increase in WBCs 2/2 infection. Baseline WBC in the 30s. WBC count improving. - has f/u appt scheduled for 8/8 - monitor WBCs  *Hypertension- BP normal this morning -  hold norvasc and losartan - continue coreg - continue imdur at decreased dose of 30mg  bid  *Chronic diabetes mellitus type 2- blood sugars have been low over the last 24 hours. - decrease lantus to 25 units in the morning and 14 units in the evening. Hold lantus for this morning, given am blood sugar of 68. - stop mealtime novolog 10 units tid - continue resistant SSI  *Chronic atrial fibrillation- rate-controlled, INR therapeutic. - continue coumadin - continue coreg  *History of systolic congestive heart failure- no signs of fluid overload. Last ECHO with EF 40-45%. - continue coreg and statin - hold losartan for AKI - on IVFs, will monitor volume status closely  *Chronic morbid obesity - discussed lifestyle modification  *Chronic GERD without esophagitis - PPI daily  All the records are reviewed and case discussed with Care Management/Social Worker. Management plans discussed with the patient, family and they are in agreement.  CODE STATUS: Full Code  TOTAL TIME TAKING CARE OF THIS PATIENT: 35 minutes.   More than 50% of the time was spent in counseling/coordination of care: YES  POSSIBLE D/C IN 1-2 DAYS, DEPENDING ON CLINICAL CONDITION and ability to wean off O2.   Berna Spare Mayo M.D on 04/17/2018 at 2:45 PM  Between 7am to 6pm - Pager [phone]  After 6pm go to [email or web address] - Proofreader  [facility]  Office  [phone]  CC: Primary care physician; System, Provider Not In  Note: This dictation was prepared with Dragon dictation along with smaller phrase technology. Any transcriptional errors that result from this process are unintentional.

## 2018-04-18 LAB — CBC
HCT: 29.5 % — ABNORMAL LOW (ref 35.0–47.0)
Hemoglobin: 9.6 g/dL — ABNORMAL LOW (ref 12.0–16.0)
MCH: 27.9 pg (ref 26.0–34.0)
MCHC: 32.6 g/dL (ref 32.0–36.0)
MCV: 85.6 fL (ref 80.0–100.0)
PLATELETS: 163 10*3/uL (ref 150–440)
RBC: 3.44 MIL/uL — ABNORMAL LOW (ref 3.80–5.20)
RDW: 17.3 % — AB (ref 11.5–14.5)
WBC: 37.6 10*3/uL — ABNORMAL HIGH (ref 3.6–11.0)

## 2018-04-18 LAB — BASIC METABOLIC PANEL
Anion gap: 7 (ref 5–15)
BUN: 58 mg/dL — AB (ref 8–23)
CHLORIDE: 109 mmol/L (ref 98–111)
CO2: 22 mmol/L (ref 22–32)
Calcium: 7.8 mg/dL — ABNORMAL LOW (ref 8.9–10.3)
Creatinine, Ser: 3.56 mg/dL — ABNORMAL HIGH (ref 0.44–1.00)
GFR calc Af Amer: 14 mL/min — ABNORMAL LOW (ref >60)
GFR calc non Af Amer: 12 mL/min — ABNORMAL LOW (ref >60)
Glucose, Bld: 100 mg/dL — ABNORMAL HIGH (ref 70–99)
POTASSIUM: 5.2 mmol/L — AB (ref 3.5–5.1)
Sodium: 138 mmol/L (ref 135–145)

## 2018-04-18 LAB — GLUCOSE, CAPILLARY
GLUCOSE-CAPILLARY: 126 mg/dL — AB (ref 70–99)
GLUCOSE-CAPILLARY: 88 mg/dL (ref 70–99)
Glucose-Capillary: 82 mg/dL (ref 70–99)
Glucose-Capillary: 120 mg/dL — ABNORMAL HIGH (ref 70–99)

## 2018-04-18 LAB — PROTIME-INR
INR: 2.4
Prothrombin Time: 26 seconds — ABNORMAL HIGH (ref 11.4–15.2)

## 2018-04-18 MED ORDER — ISOSORBIDE MONONITRATE ER 30 MG PO TB24: 60.0000 mg | ORAL_TABLET | Freq: Two times a day (BID) | ORAL | 0 refills | Status: DC

## 2018-04-18 MED ORDER — WARFARIN SODIUM 6 MG PO TABS
6.0000 mg | ORAL_TABLET | ORAL | Status: DC
  Administered 2018-04-18: 6 mg via ORAL
  Filled 2018-04-18 (×2): qty 1

## 2018-04-18 MED ORDER — WARFARIN SODIUM 4 MG PO TABS: 4.0000 mg | ORAL_TABLET | ORAL | Status: DC

## 2018-04-18 MED ORDER — CEFDINIR 300 MG PO CAPS: 300.0000 mg | ORAL_CAPSULE | Freq: Every day | ORAL | 0 refills | Status: DC

## 2018-04-18 MED ORDER — FUROSEMIDE 40 MG PO TABS
40.0000 mg | ORAL_TABLET | Freq: Two times a day (BID) | ORAL | Status: DC
  Administered 2018-04-18 – 2018-04-19 (×2): 40 mg via ORAL
  Filled 2018-04-18 (×2): qty 1

## 2018-04-18 NOTE — Discharge Summary (Addendum)
[facility] [place] NAME: Caroline Hernandez    MR#:  [id number]  DATE OF BIRTH:  February 13, 1946  DATE OF ADMISSION:  04/14/2018   ADMITTING PHYSICIAN: Gorden Harms, MD  DATE OF DISCHARGE: 04/18/18  PRIMARY CARE PHYSICIAN: System, Provider Not In   ADMISSION DIAGNOSIS:  Sepsis, due to unspecified organism ([place]) [A41.9] Community acquired pneumonia, unspecified laterality [J18.9] DISCHARGE DIAGNOSIS:  Active Problems:   CAP (community acquired pneumonia)  SECONDARY DIAGNOSIS:   Past Medical History:  Diagnosis Date  . Amputation of left lower extremity below knee ([place])   . Amputation of right lower extremity below knee ([place])   . CAD (coronary artery disease)    a. 2004 Cardiac Arrest/CABG x 3 (LIMA->LAD, VG->OM, VG->RCA);  b. 06/2015 lexiscan MV: no significant ischemia, EF 48%, low risk->Med Rx.  . Chronic combined systolic and diastolic CHF (congestive heart failure) ([place])    a. 12/2014 Echo: EF 45-50%;  b. 08/2015 Echo: EF 45-50%, ant, antsept HK, mildly dil LA, nl RV, mild-mod TR, sev PAH (65mmHg).  . CKD (chronic kidney disease), stage IV ([place])   . CLL (chronic lymphocytic leukemia) ([place])   . Diabetes mellitus without complication ([place])   . Essential hypertension   . GERD (gastroesophageal reflux disease)   . Hiatal hernia   . History of cardiac arrest    a. 2004.  [place] Hyperlipidemia   . Ischemic cardiomyopathy    a. s/p MDT ICD (originally had 9381 lead-->gen change and lead revision ~ 2012 @ [facility]); b. 12/2014 Echo: EF 45-50%;  c.08/2015 Echo: EF 45-50%, ant, antsept HK. Nl RV.  [place] PAD (peripheral artery disease) ([facility])    a. s/p BKA  . Persistent atrial fibrillation ([place])    a. Dx 12/2014.  CHA2DS2VASc = 6--> warfarin;  b. 09/2015 s/p DCCV-->on amio; c. s/p DCCV 04/25/16   HOSPITAL COURSE:   Caroline Hernandez is a 72 year old female with a PMH of CAD, CLL. HTN, HLD, chronic combined systolic and diastolic HF, bilateral BKAs who presented to the  ED with fever, shortness of breath, and productive cough. She was hypoxic to 85^ on room air. CXR showed bilateral pneumonia. She was admitted for further management.  E. Coli bacteremia 2/2 CAP- compounded by immunosuppression from CLL. Bacteremia could also have been due to UTI, but urine culture not collected on admission. - initially on rocephin/azithromycin, changed to vanc/mero (zosyn allergy), then changed to omnicef to cover both the lungs and the urine - repeat blood cultures on 8/6 with no growth - initially on oxygen, but was able to be weaned to room air. Unable to ambulate with pulse ox due to bilateral BKAs  Acute renal failure in chronic kidney disease stage IV- likely due to combination of dehydration, lasix use, and vancomycin. Cr peaked at 4.64 and then trended down to 3.56 on the day of discharge - losartan held during admission and not restarted on discharge - nephrology was consulted - given aggreesive IV fluids  Volume overload with history of systolic congestive heart failure- Last ECHO with EF 40-45%. On day of discharge, her stumps were edematous and she was unable to put on her prosthetic legs. - continue coreg and statin - held losartan for AKI - lasix restarted at 40mg  bid - [facility] RN ordered on discharge for lasix protocol at home  Leukocytosis with history of CLL- follows with Dr. Selena Batten [facility]). WBC peaked at 52.8, but decreased to 37.6 on the day of discharge.  Baseline WBCs in the 30s. - will need f/u on discharge  Hypertension- BPs mostly normotensive during admission - held losartan on discharge - continued coreg, norvasc, and imdur  Chronic diabetes mellitus type 2- she did have an episode of low blood sugar to 68 while not eating much. Improved as appetite improved.  - home insulin regimen continued on discharge  Chronic atrial fibrillation- rate-controlled, INR therapeutic. - continued coumadin and coreg  Chronic morbid obesity - discussed lifestyle  modification  DISCHARGE CONDITIONS:  Stable, improved CONSULTS OBTAINED:  nephrology  DRUG ALLERGIES:   Allergies  Allergen Reactions  . Zosyn [Piperacillin Sod-Tazobactam So] Other (See Comments)    Renal failure   DISCHARGE MEDICATIONS:   Allergies as of 04/18/2018      Reactions   Zosyn [piperacillin Sod-tazobactam So] Other (See Comments)   Renal failure      Medication List    STOP taking these medications   losartan 100 MG tablet Commonly known as:  COZAAR     TAKE these medications   albuterol 108 (90 Base) MCG/ACT inhaler Commonly known as:  PROVENTIL HFA;VENTOLIN HFA Inhale 2 puffs into the lungs every 6 (six) hours as needed for wheezing or shortness of breath.   amitriptyline 25 MG tablet Commonly known as:  ELAVIL Take 25 mg by mouth at bedtime.   amLODipine 10 MG tablet Commonly known as:  NORVASC Take 1 tablet (10 mg total) by mouth daily.   atorvastatin 40 MG tablet Commonly known as:  LIPITOR Take 40 mg by mouth at bedtime.   carvedilol 3.125 MG tablet Commonly known as:  COREG TAKE 1 TABLET BY MOUTH TWICE DAILY   cefdinir 300 MG capsule Commonly known as:  OMNICEF Take 1 capsule (300 mg total) by mouth daily.   furosemide 40 MG tablet Commonly known as:  LASIX TAKE 1 TABLET(40 MG) BY MOUTH TWICE DAILY   gabapentin 100 MG capsule Commonly known as:  NEURONTIN Take 100 mg by mouth 2 (two) times daily.   HM VITAMIN D3 4000 units Caps Generic drug:  Cholecalciferol Take 4,000 Units by mouth daily.   insulin aspart 100 UNIT/ML injection Commonly known as:  novoLOG Inject 5-10 Units into the skin 3 (three) times daily before meals. 10 units into the skin before breakfast then 5 units before lunch then 10 units before supper (evening meal)   iron polysaccharides 150 MG capsule Commonly known as:  NIFEREX Take 150 mg by mouth daily.   isosorbide mononitrate 30 MG 24 hr tablet Commonly known as:  IMDUR Take 2 tablets (60 mg total) by  mouth 2 (two) times daily. What changed:  additional instructions   LANTUS 100 UNIT/ML injection Generic drug:  insulin glargine Inject 14-38 Units into the skin 2 (two) times daily before a meal. 38 units into the skin before breakfast and 14 units before supper (evening meal)   nitroGLYCERIN 0.4 MG SL tablet Commonly known as:  NITROSTAT Place 1 tablet (0.4 mg total) under the tongue every 5 (five) minutes as needed for chest pain.   pantoprazole 40 MG tablet Commonly known as:  PROTONIX Take 40 mg by mouth at bedtime.   potassium chloride SA 20 MEQ tablet Commonly known as:  K-DUR,KLOR-CON TAKE 1 TABLET BY MOUTH TWICE DAILY What changed:    how much to take  how to take this  when to take this   PREVIDENT 5000 DRY MOUTH 1.1 % Gel dental gel Generic drug:  sodium fluoride Place 1 application onto teeth  daily.   sertraline 100 MG tablet Commonly known as:  ZOLOFT Take 100 mg by mouth at bedtime.   vitamin C 500 MG tablet Commonly known as:  ASCORBIC ACID Take 500 mg by mouth daily.   VITEYES AREDS FORMULA/LUTEIN Caps Take 1 capsule by mouth 2 (two) times daily.   warfarin 4 MG tablet Commonly known as:  COUMADIN Take as directed. If you are unsure how to take this medication, talk to your nurse or doctor. Original instructions:  TAKE AS DIRECTED BY COUMADIN CLINIC      DISCHARGE INSTRUCTIONS:  1. F/u with PCP in 1-2 weeks 2. F/u with hem/onc in 1-2 weeks. 3. F/u in nephrology in 1-2 weeks. Lasix and losartan held on discharge. Please monitor BP, Cr, and volume status and restart these medications as needed. 4. Treated with empiric antibiotics while hospitalized and transitioned to [facility] on discharge for a total 7 day course 5. Noted to have volume overload on the day of discharge after receiving aggressive IVFs for acute renal failure. [facility] RN ordered for home lasix protocol. DIET:  Diabetic diet DISCHARGE CONDITION:  Stable ACTIVITY:  Activity as  tolerated OXYGEN:  Home Oxygen: No.  Oxygen Delivery: room air DISCHARGE LOCATION:  home   If you experience worsening of your admission symptoms, develop shortness of breath, life threatening emergency, suicidal or homicidal thoughts you must seek medical attention immediately by calling 911 or calling your MD immediately  if symptoms less severe.  You Must read complete instructions/literature along with all the possible adverse reactions/side effects for all the Medicines you take and that have been prescribed to you. Take any new Medicines after you have completely understood and accpet all the possible adverse reactions/side effects.   Please note  You were cared for by a hospitalist during your hospital stay. If you have any questions about your discharge medications or the care you received while you were in the hospital after you are discharged, you can call the unit and asked to speak with the hospitalist on call if the hospitalist that took care of you is not available. Once you are discharged, your primary care physician will handle any further medical issues. Please note that NO REFILLS for any discharge medications will be authorized once you are discharged, as it is imperative that you return to your primary care physician (or establish a relationship with a primary care physician if you do not have one) for your aftercare needs so that they can reassess your need for medications and monitor your lab values.    On the day of Discharge:  VITAL SIGNS:  Blood pressure (!) 141/70, pulse 76, temperature 98.4 F (36.9 C), temperature source Oral, resp. rate 18, height 5\' 8"  (1.727 m), weight 95.7 kg, SpO2 91 %. PHYSICAL EXAMINATION:  GENERAL:  72 y.o.-year-old patient lying in the bed with no acute distress.  EYES: Pupils equal, round, reactive to light and accommodation. No scleral icterus. Extraocular muscles intact.  HEENT: Head atraumatic, normocephalic. Oropharynx and nasopharynx  clear.  NECK:  Supple, no jugular venous distention. No thyroid enlargement, no tenderness.  LUNGS: Normal breath sounds bilaterally, +faint crackles in the left lung base, No use of accessory muscles of respiration.  CARDIOVASCULAR: S1, S2 normal. No murmurs, rubs, or gallops.  ABDOMEN: Soft, non-tender, non-distended. Bowel sounds present. No organomegaly or mass.  EXTREMITIES: bilateral BKAs, no pitting edema NEUROLOGIC: Cranial nerves II through XII are intact. Muscle strength 5/5 in all extremities. Sensation intact. Gait not checked.  PSYCHIATRIC: The patient is alert and oriented x 3.  SKIN: No obvious rash, lesion, or ulcer.  DATA REVIEW:   CBC Recent Labs  Lab 04/18/18 0413  WBC 37.6*  HGB 9.6*  HCT 29.5*  PLT 163    Chemistries  Recent Labs  Lab 04/14/18 1343  04/18/18 0413  NA 137   < > 138  K 5.0   < > 5.2*  CL 102   < > 109  CO2 24   < > 22  GLUCOSE 119*   < > 100*  BUN 41*   < > 58*  CREATININE 3.35*   < > 3.56*  CALCIUM 8.5*   < > 7.8*  AST 22  --   --   ALT 12  --   --   ALKPHOS 74  --   --   BILITOT 1.8*  --   --    < > = values in this interval not displayed.     Microbiology Results  Results for orders placed or performed during the hospital encounter of 04/14/18  Culture, blood (Routine x 2)     Status: Abnormal   Collection Time: 04/14/18  1:44 PM  Result Value Ref Range Status   Specimen Description   Final    BLOOD BLOOD LEFT FOREARM Performed at [facility], [address] [place], [place] [address]    Special Requests   Final    BOTTLES DRAWN AEROBIC AND ANAEROBIC Blood Culture adequate volume Performed at [facility], [address] [place], [place] [address]    Culture  Setup Time   Final    GRAM NEGATIVE RODS IN BOTH AEROBIC AND ANAEROBIC BOTTLES CRITICAL RESULT CALLED TO, READ BACK BY AND VERIFIED WITH: DAVID BESANTI AT 5366 04/15/18.PMH Performed at [facility], [place] [address], [place], [place]  [address]    Culture ESCHERICHIA COLI (A)  Final   Report Status 04/17/2018 FINAL  Final   Organism ID, Bacteria ESCHERICHIA COLI  Final      Susceptibility   Escherichia coli - MIC*    AMPICILLIN <=2 SENSITIVE Sensitive     CEFAZOLIN <=4 SENSITIVE Sensitive     CEFEPIME <=1 SENSITIVE Sensitive     CEFTAZIDIME <=1 SENSITIVE Sensitive     CEFTRIAXONE <=1 SENSITIVE Sensitive     CIPROFLOXACIN <=0.25 SENSITIVE Sensitive     GENTAMICIN <=1 SENSITIVE Sensitive     IMIPENEM <=0.25 SENSITIVE Sensitive     TRIMETH/SULFA <=20 SENSITIVE Sensitive     AMPICILLIN/SULBACTAM <=2 SENSITIVE Sensitive     PIP/TAZO <=4 SENSITIVE Sensitive     Extended ESBL NEGATIVE Sensitive     * ESCHERICHIA COLI  Culture, blood (Routine x 2)     Status: Abnormal   Collection Time: 04/14/18  1:44 PM  Result Value Ref Range Status   Specimen Description   Final    BLOOD BLOOD RIGHT FOREARM Performed at [facility], [address], [place], [place] [address]    Special Requests   Final    BOTTLES DRAWN AEROBIC AND ANAEROBIC Blood Culture adequate volume Performed at [facility], [address] [place], [place] [address]    Culture  Setup Time   Final    GRAM NEGATIVE RODS IN BOTH AEROBIC AND ANAEROBIC BOTTLES CRITICAL RESULT CALLED TO, READ BACK BY AND VERIFIED WITH: DAVID BESANTI AT 5643 04/15/18.PMH Performed at [facility], [address], [place], [place] [address]    Culture (A)  Final  ESCHERICHIA COLI SUSCEPTIBILITIES PERFORMED ON PREVIOUS CULTURE WITHIN THE LAST 5 DAYS. Performed at [facility], [place] [address] [place], [place] [address]    Report Status 04/17/2018 FINAL  Final  Blood Culture ID Panel (Reflexed)     Status: Abnormal   Collection Time: 04/14/18  1:44 PM  Result Value Ref Range Status   Enterococcus species NOT DETECTED NOT DETECTED Final   Vancomycin resistance NOT DETECTED NOT DETECTED Final   Listeria monocytogenes NOT DETECTED NOT DETECTED  Final   Staphylococcus species NOT DETECTED NOT DETECTED Final   Staphylococcus aureus NOT DETECTED NOT DETECTED Final   Methicillin resistance NOT DETECTED NOT DETECTED Final   Streptococcus species NOT DETECTED NOT DETECTED Final   Streptococcus agalactiae NOT DETECTED NOT DETECTED Final   Streptococcus pneumoniae NOT DETECTED NOT DETECTED Final   Streptococcus pyogenes NOT DETECTED NOT DETECTED Final   Acinetobacter baumannii NOT DETECTED NOT DETECTED Final   Enterobacteriaceae species DETECTED (A) NOT DETECTED Final    Comment: CRITICAL RESULT CALLED TO, READ BACK BY AND VERIFIED WITH: DAVID BESANTI AT 6754 04/15/18.PMH Enterobacteriaceae represent a large family of gram-negative bacteria, not a single organism.    Enterobacter cloacae complex NOT DETECTED NOT DETECTED Final   Escherichia coli DETECTED (A) NOT DETECTED Final    Comment: CRITICAL RESULT CALLED TO, READ BACK BY AND VERIFIED WITH: DAVID BESANTI AT 4920 04/15/18.PMH    Klebsiella oxytoca NOT DETECTED NOT DETECTED Final   Klebsiella pneumoniae NOT DETECTED NOT DETECTED Final   Proteus species NOT DETECTED NOT DETECTED Final   Serratia marcescens NOT DETECTED NOT DETECTED Final   Carbapenem resistance NOT DETECTED NOT DETECTED Final   Haemophilus influenzae NOT DETECTED NOT DETECTED Final   Neisseria meningitidis NOT DETECTED NOT DETECTED Final   Pseudomonas aeruginosa NOT DETECTED NOT DETECTED Final   Candida albicans NOT DETECTED NOT DETECTED Final   Candida glabrata NOT DETECTED NOT DETECTED Final   Candida krusei NOT DETECTED NOT DETECTED Final   Candida parapsilosis NOT DETECTED NOT DETECTED Final   Candida tropicalis NOT DETECTED NOT DETECTED Final    Comment: Performed at [facility], [address] [place], [place] [address]  MRSA PCR Screening     Status: Abnormal   Collection Time: 04/16/18  5:47 AM  Result Value Ref Range Status   MRSA by PCR POSITIVE (A) NEGATIVE Final    Comment:        The  GeneXpert MRSA Assay (FDA approved for NASAL specimens only), is one component of a comprehensive MRSA colonization surveillance program. It is not intended to diagnose MRSA infection nor to guide or monitor treatment for MRSA infections. RESULT CALLED TO, READ BACK BY AND VERIFIED WITH: OLIVIA ROGERS AT 2197 ON 04/16/18 [place]. Performed at [facility], [address] [place], [place] [address]   CULTURE, BLOOD (ROUTINE X 2) w Reflex to ID Panel     Status: None (Preliminary result)   Collection Time: 04/16/18  1:18 PM  Result Value Ref Range Status   Specimen Description BLOOD RIGHT HAND  Final   Special Requests   Final    BOTTLES DRAWN AEROBIC AND ANAEROBIC Blood Culture adequate volume   Culture   Final    NO GROWTH 2 DAYS Performed at [facility], [address] [place], [place] [address]    Report Status PENDING  Incomplete  CULTURE, BLOOD (ROUTINE X 2) w Reflex to ID Panel     Status: None (Preliminary result)   Collection  Time: 04/16/18  1:18 PM  Result Value Ref Range Status   Specimen Description BLOOD LEFT HAND  Final   Special Requests   Final    BOTTLES DRAWN AEROBIC AND ANAEROBIC Blood Culture adequate volume   Culture   Final    NO GROWTH 2 DAYS Performed at [facility], [address], [place], [place] [address]    Report Status PENDING  Incomplete    RADIOLOGY:  No results found.   Management plans discussed with the patient, family and they are in agreement.  CODE STATUS: Full Code   TOTAL TIME TAKING CARE OF THIS PATIENT: 35 minutes.    Berna Spare Michaiah Maiden M.D on 04/18/2018 at 4:49 PM  Between 7am to 6pm - Pager - [phone]  After 6pm go to [email or web address] - Proofreader  [facility]  Office  [phone]  CC: Primary care physician; System, Provider Not In   Note: This dictation was prepared with Dragon dictation along with smaller phrase technology. Any transcriptional errors that  result from this process are unintentional.

## 2018-04-18 NOTE — Progress Notes (Signed)
[facility]  Next of kin: Jenell Milliner [phone], son in law PCP: Francesco Sor at [facility] MD: Bernarda Caffey, Windell Moulding 04/18/2018, 11:44 AM

## 2018-04-18 NOTE — Progress Notes (Signed)
[facility]  ROUNDING NOTE   Subjective:   Creatinine 3.56 (4.41) Wbc 57.3 (47.3) (52.8)  NS at 162mL/hr  Objective:  Vital signs in last 24 hours:  Temp:  [97.9 F (36.6 C)-98.4 F (36.9 C)] 98.4 F (36.9 C) (08/08 0546) Pulse Rate:  [67-74] 70 (08/08 0546) Resp:  [16-24] 24 (08/08 0546) BP: (142-144)/(62-93) 144/81 (08/08 0546) SpO2:  [93 %-98 %] 93 % (08/08 0546)  Weight change:  Filed Weights   04/14/18 1320  Weight: 95.7 kg    Intake/Output: I/O last 3 completed shifts: In: 5397 [P.O.:480; I.V.:2852; IV Piggyback:100] Out: -    Intake/Output this shift:  Total I/O In: 261.7 [I.V.:261.7] Out: -   Physical Exam: General: NAD, sitting up in bed  Head: Normocephalic, atraumatic. Moist oral mucosal membranes  Eyes: Anicteric, PERRL  Neck: Supple, trachea midline  Lungs:  Clear to auscultation  Heart: irregular  Abdomen:  Soft, nontender,   Extremities:  bilateral below the knee amputations, no edema  Neurologic: Nonfocal, moving all four extremities  Skin: No lesions        Basic Metabolic Panel: Recent Labs  Lab 04/14/18 1343 04/15/18 1004 04/16/18 0434 04/17/18 0334 04/18/18 0413  NA 137 133* 135 136 138  K 5.0 5.9* 5.3* 4.9 5.2*  CL 102 102 104 105 109  CO2 24 20* 21* 22 22  GLUCOSE 119* 329* 120* 90 100*  BUN 41* 59* 68* 67* 58*  CREATININE 3.35* 4.07* 4.64* 4.41* 3.56*  CALCIUM 8.5* 7.4* 7.6* 7.6* 7.8*  PHOS  --   --   --  5.8*  --     Liver Function Tests: Recent Labs  Lab 04/14/18 1343 04/17/18 0334  AST 22  --   ALT 12  --   ALKPHOS 74  --   BILITOT 1.8*  --   PROT 7.3  --   ALBUMIN 3.7 3.2*   No results for input(s): LIPASE, AMYLASE in the last 168 hours. No results for input(s): AMMONIA in the last 168 hours.  CBC: Recent Labs  Lab 04/14/18 1343 04/15/18 1004 04/16/18 0434 04/17/18 0334 04/18/18 0413  WBC 42.4* 43.6* 52.8* 47.3* 37.6*  NEUTROABS 15.3*  --   --   --   --   HGB 10.9* 9.0* 9.6* 9.5*  9.6*  HCT 34.0* 28.4* 30.3* 29.7* 29.5*  MCV 85.7 86.9 86.5 87.3 85.6  PLT 167 133* 162 163 163    Cardiac Enzymes: No results for input(s): CKTOTAL, CKMB, CKMBINDEX, TROPONINI in the last 168 hours.  BNP: Invalid input(s): POCBNP  CBG: Recent Labs  Lab 04/17/18 0900 04/17/18 1206 04/17/18 1620 04/17/18 2135 04/18/18 0745  GLUCAP 99 83 86 76 82    Microbiology: Results for orders placed or performed during the hospital encounter of 04/14/18  Culture, blood (Routine x 2)     Status: Abnormal   Collection Time: 04/14/18  1:44 PM  Result Value Ref Range Status   Specimen Description   Final    BLOOD BLOOD LEFT FOREARM Performed at [facility], [address] [place], [place] [address]    Special Requests   Final    BOTTLES DRAWN AEROBIC AND ANAEROBIC Blood Culture adequate volume Performed at [facility], [address] [place], [place] [address]    Culture  Setup Time   Final    GRAM NEGATIVE RODS IN BOTH AEROBIC AND ANAEROBIC BOTTLES CRITICAL RESULT CALLED TO, READ BACK BY AND VERIFIED WITH: DAVID BESANTI AT 2409 04/15/18.PMH Performed at  [facility], [place] [address], [place], [place] [address]    Culture ESCHERICHIA COLI (A)  Final   Report Status 04/17/2018 FINAL  Final   Organism ID, Bacteria ESCHERICHIA COLI  Final      Susceptibility   Escherichia coli - MIC*    AMPICILLIN <=2 SENSITIVE Sensitive     CEFAZOLIN <=4 SENSITIVE Sensitive     CEFEPIME <=1 SENSITIVE Sensitive     CEFTAZIDIME <=1 SENSITIVE Sensitive     CEFTRIAXONE <=1 SENSITIVE Sensitive     CIPROFLOXACIN <=0.25 SENSITIVE Sensitive     GENTAMICIN <=1 SENSITIVE Sensitive     IMIPENEM <=0.25 SENSITIVE Sensitive     TRIMETH/SULFA <=20 SENSITIVE Sensitive     AMPICILLIN/SULBACTAM <=2 SENSITIVE Sensitive     PIP/TAZO <=4 SENSITIVE Sensitive     Extended ESBL NEGATIVE Sensitive     * ESCHERICHIA COLI  Culture, blood (Routine x 2)     Status: Abnormal   Collection Time:  04/14/18  1:44 PM  Result Value Ref Range Status   Specimen Description   Final    BLOOD BLOOD RIGHT FOREARM Performed at [facility], [address], [place], [place] [address]    Special Requests   Final    BOTTLES DRAWN AEROBIC AND ANAEROBIC Blood Culture adequate volume Performed at [facility], [address] [place], [place] [address]    Culture  Setup Time   Final    GRAM NEGATIVE RODS IN BOTH AEROBIC AND ANAEROBIC BOTTLES CRITICAL RESULT CALLED TO, READ BACK BY AND VERIFIED WITH: DAVID BESANTI AT 0932 04/15/18.PMH Performed at [facility], [address] [place], [place] [address]    Culture (A)  Final    ESCHERICHIA COLI SUSCEPTIBILITIES PERFORMED ON PREVIOUS CULTURE WITHIN THE LAST 5 DAYS. Performed at [facility], [place] [address], [place], [place] [address]    Report Status 04/17/2018 FINAL  Final  Blood Culture ID Panel (Reflexed)     Status: Abnormal   Collection Time: 04/14/18  1:44 PM  Result Value Ref Range Status   Enterococcus species NOT DETECTED NOT DETECTED Final   Vancomycin resistance NOT DETECTED NOT DETECTED Final   Listeria monocytogenes NOT DETECTED NOT DETECTED Final   Staphylococcus species NOT DETECTED NOT DETECTED Final   Staphylococcus aureus NOT DETECTED NOT DETECTED Final   Methicillin resistance NOT DETECTED NOT DETECTED Final   Streptococcus species NOT DETECTED NOT DETECTED Final   Streptococcus agalactiae NOT DETECTED NOT DETECTED Final   Streptococcus pneumoniae NOT DETECTED NOT DETECTED Final   Streptococcus pyogenes NOT DETECTED NOT DETECTED Final   Acinetobacter baumannii NOT DETECTED NOT DETECTED Final   Enterobacteriaceae species DETECTED (A) NOT DETECTED Final    Comment: CRITICAL RESULT CALLED TO, READ BACK BY AND VERIFIED WITH: DAVID BESANTI AT 4270 04/15/18.PMH Enterobacteriaceae represent a large family of gram-negative bacteria, not a single organism.    Enterobacter cloacae complex NOT  DETECTED NOT DETECTED Final   Escherichia coli DETECTED (A) NOT DETECTED Final    Comment: CRITICAL RESULT CALLED TO, READ BACK BY AND VERIFIED WITH: DAVID BESANTI AT 6237 04/15/18.PMH    Klebsiella oxytoca NOT DETECTED NOT DETECTED Final   Klebsiella pneumoniae NOT DETECTED NOT DETECTED Final   Proteus species NOT DETECTED NOT DETECTED Final   Serratia marcescens NOT DETECTED NOT DETECTED Final   Carbapenem resistance NOT DETECTED NOT DETECTED Final   Haemophilus influenzae NOT DETECTED NOT DETECTED Final   Neisseria meningitidis NOT DETECTED NOT DETECTED Final  Pseudomonas aeruginosa NOT DETECTED NOT DETECTED Final   Candida albicans NOT DETECTED NOT DETECTED Final   Candida glabrata NOT DETECTED NOT DETECTED Final   Candida krusei NOT DETECTED NOT DETECTED Final   Candida parapsilosis NOT DETECTED NOT DETECTED Final   Candida tropicalis NOT DETECTED NOT DETECTED Final    Comment: Performed at [facility], [address] [place], [place] [address]  MRSA PCR Screening     Status: Abnormal   Collection Time: 04/16/18  5:47 AM  Result Value Ref Range Status   MRSA by PCR POSITIVE (A) NEGATIVE Final    Comment:        The GeneXpert MRSA Assay (FDA approved for NASAL specimens only), is one component of a comprehensive MRSA colonization surveillance program. It is not intended to diagnose MRSA infection nor to guide or monitor treatment for MRSA infections. RESULT CALLED TO, READ BACK BY AND VERIFIED WITH: OLIVIA ROGERS AT 7939 ON 04/16/18 [place]. Performed at [facility], [address] [place], [place] [address]   CULTURE, BLOOD (ROUTINE X 2) w Reflex to ID Panel     Status: None (Preliminary result)   Collection Time: 04/16/18  1:18 PM  Result Value Ref Range Status   Specimen Description BLOOD RIGHT HAND  Final   Special Requests   Final    BOTTLES DRAWN AEROBIC AND ANAEROBIC Blood Culture adequate volume   Culture   Final    NO GROWTH 2 DAYS Performed  at [facility], [address] [place], [place] [address]    Report Status PENDING  Incomplete  CULTURE, BLOOD (ROUTINE X 2) w Reflex to ID Panel     Status: None (Preliminary result)   Collection Time: 04/16/18  1:18 PM  Result Value Ref Range Status   Specimen Description BLOOD LEFT HAND  Final   Special Requests   Final    BOTTLES DRAWN AEROBIC AND ANAEROBIC Blood Culture adequate volume   Culture   Final    NO GROWTH 2 DAYS Performed at [facility], [address] [place], [place] [address]    Report Status PENDING  Incomplete    Coagulation Studies: Recent Labs    04/16/18 0434 04/17/18 0334 04/18/18 0413  LABPROT 26.8* 26.4* 26.0*  INR 2.50 2.45 2.40    Urinalysis: No results for input(s): COLORURINE, LABSPEC, PHURINE, GLUCOSEU, HGBUR, BILIRUBINUR, KETONESUR, PROTEINUR, UROBILINOGEN, NITRITE, LEUKOCYTESUR in the last 72 hours.  Invalid input(s): APPERANCEUR    Imaging: US Renal  Result Date: 04/16/2018 CLINICAL DATA:  Acute renal failure. EXAM: RENAL / URINARY TRACT ULTRASOUND COMPLETE COMPARISON:  CT 03/07/2018. FINDINGS: Right Kidney: Length: 11.3 cm. Echogenicity within normal limits. Probable vascular calcifications. Nonobstructing calyceal stones cannot be excluded. No mass or hydronephrosis visualized. Left Kidney: Length: 11.6 cm. Echogenicity within normal limits. Probable vascular calcifications. Nonobstructing calyceal stones cannot be excluded. No mass or hydronephrosis visualized. Bladder: 3.9 cm echogenic mass noted along the posterior bladder wall. Bladder tumor cannot be excluded. Urologic evaluation should be considered. Questionable bladder diverticulum also noted. IMPRESSION: 1. Probable bilateral renal vascular calcifications suggesting renal vascular disease. Nonobstructing calyceal stones cannot be excluded. No acute renal abnormality. No hydronephrosis. 2. 3.9 cm echogenic mass noted along the posterior bladder wall. Bladder tumor  cannot be excluded. Urologic evaluation suggested. Questionable bladder diverticulum also noted. Electronically Signed   By: Marcello Moores  Register   On: 04/16/2018 12:27     Medications:    . amitriptyline  25 mg Oral QHS  . atorvastatin  40 mg Oral QHS  . carvedilol  3.125 mg Oral BID  . cefdinir  300 mg Oral Daily  . Chlorhexidine Gluconate Cloth  6 each Topical Q0600  . cholecalciferol  4,000 Units Oral Daily  . gabapentin  100 mg Oral BID  . insulin aspart  0-20 Units Subcutaneous TID WC  . insulin aspart  0-5 Units Subcutaneous QHS  . insulin glargine  14 Units Subcutaneous QAC supper  . insulin glargine  25 Units Subcutaneous QAC breakfast  . iron polysaccharides  150 mg Oral Daily  . isosorbide mononitrate  30 mg Oral BID  . multivitamin-lutein  1 capsule Oral BID  . mupirocin ointment  1 application Nasal BID  . pantoprazole  40 mg Oral QHS  . sertraline  100 mg Oral QHS  . vitamin C  500 mg Oral Daily  . Warfarin - Pharmacist Dosing Inpatient   Does not apply q1800   albuterol, alum & mag hydroxide-simeth, nitroGLYCERIN  Assessment/ Plan:  [name] is a 72 y.o.  female with atrial fibrillation, hypertension, diabetes mellitus type II insulin dependent, diabetic neuropathy, congestive heart failure, coronary artery disease status post CABG, GERD, CLL, bilateral below the knee amputations, who was admitted to [facility] on 04/14/2018 for Sepsis, due to unspecified organism ([place]) [A41.9] Community acquired pneumonia, unspecified laterality [J18.9]  1. Acute renal failure with hyperkalemia and hyponatremia on chronic kidney disease stage IV with proteinuria: baseline creatinine of 2.55, GFR of 18 on 02/2018 Acute renal failure secondary to ATN, prerenal azotemia and sepsis Chronic kidney disease secondary to diabetic nephropathy, hypertension.  - Holding losartan and furosemide - Low potassium diet.  2. Hypertension: with history of congestive heart failure systolic with EF  of 53-97% on echo 10/09/16.  Irregular heart beat on examination: atrial fibrillation - Continue isosorbide mononitrate, carvedilol  3. Urinary tract infection: E. Coli.  - cefdinir  4. 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 Leukocytosis  - followed by hematology at [facility]  5. Diabetes mellitus type II with chronic kidney disease: insulin dependent. Hemoglobin A1c 9.1% on 03/07/18.   6. Bladder mass: found on ultrasound  -  Outpatient urology consultation.    LOS: 4 Netanel Yannuzzi 8/8/201910:08 AM

## 2018-04-18 NOTE — Progress Notes (Signed)
Patient with c/o increasing edema, so much so that she can no longer fit her prosthetic. RN notes 1-2+ edema in bilateral legs and arms. Patient also now complaining of SOB. Lung sounds now noted to have crackles in the bases. Nephrology ordered patient to restart her lasix. 2L O2 placed back on patient. Will continue to monitor.

## 2018-04-18 NOTE — Care Management Note (Signed)
Case Management Note  Patient Details  Name: Caroline Hernandez MRN: [id number] Date of Birth: 1946-06-04   Per patients request referral sent to [place] [facility].  Heart Failure protocol has been signed and provided to [facility] with [place].  Patient is currently SOB and requiring acute o2.  Patient to receive lasix and RN to attempt to wean O2  Subjective/Objective:                    Action/Plan:   Expected Discharge Date:  04/18/18               Expected Discharge Plan:  [place]  In-House Referral:     Discharge planning Services  CM Consult  Post Acute Care Choice:  Home Health Choice offered to:  Patient  DME Arranged:    DME Agency:     HH Arranged:  RN, PT [place] Agency:  [place]  Status of Service:  Completed, signed off  If discussed at [place] of Stay Meetings, dates discussed:    Additional Comments:  Beverly Sessions, RN 04/18/2018, 2:57 PM

## 2018-04-18 NOTE — Progress Notes (Deleted)
Caroline Hernandez to be D/C'd home per MD order.  Discussed prescriptions and follow up appointments with the patient. Prescriptions given to patient, medication list explained in detail. Pt verbalized understanding.  Allergies as of 04/18/2018      Reactions   Zosyn [piperacillin Sod-tazobactam So] Other (See Comments)   Renal failure      Medication List    STOP taking these medications   furosemide 40 MG tablet Commonly known as:  LASIX   losartan 100 MG tablet Commonly known as:  COZAAR     TAKE these medications   albuterol 108 (90 Base) MCG/ACT inhaler Commonly known as:  PROVENTIL HFA;VENTOLIN HFA Inhale 2 puffs into the lungs every 6 (six) hours as needed for wheezing or shortness of breath.   amitriptyline 25 MG tablet Commonly known as:  ELAVIL Take 25 mg by mouth at bedtime.   amLODipine 10 MG tablet Commonly known as:  NORVASC Take 1 tablet (10 mg total) by mouth daily.   atorvastatin 40 MG tablet Commonly known as:  LIPITOR Take 40 mg by mouth at bedtime.   carvedilol 3.125 MG tablet Commonly known as:  COREG TAKE 1 TABLET BY MOUTH TWICE DAILY   cefdinir 300 MG capsule Commonly known as:  OMNICEF Take 1 capsule (300 mg total) by mouth daily.   gabapentin 100 MG capsule Commonly known as:  NEURONTIN Take 100 mg by mouth 2 (two) times daily.   HM VITAMIN D3 4000 units Caps Generic drug:  Cholecalciferol Take 4,000 Units by mouth daily.   insulin aspart 100 UNIT/ML injection Commonly known as:  novoLOG Inject 5-10 Units into the skin 3 (three) times daily before meals. 10 units into the skin before breakfast then 5 units before lunch then 10 units before supper (evening meal)   iron polysaccharides 150 MG capsule Commonly known as:  NIFEREX Take 150 mg by mouth daily.   isosorbide mononitrate 30 MG 24 hr tablet Commonly known as:  IMDUR Take 2 tablets (60 mg total) by mouth 2 (two) times daily. What changed:  additional instructions   LANTUS 100  UNIT/ML injection Generic drug:  insulin glargine Inject 14-38 Units into the skin 2 (two) times daily before a meal. 38 units into the skin before breakfast and 14 units before supper (evening meal)   nitroGLYCERIN 0.4 MG SL tablet Commonly known as:  NITROSTAT Place 1 tablet (0.4 mg total) under the tongue every 5 (five) minutes as needed for chest pain.   pantoprazole 40 MG tablet Commonly known as:  PROTONIX Take 40 mg by mouth at bedtime.   potassium chloride SA 20 MEQ tablet Commonly known as:  K-DUR,KLOR-CON TAKE 1 TABLET BY MOUTH TWICE DAILY What changed:    how much to take  how to take this  when to take this   PREVIDENT 5000 DRY MOUTH 1.1 % Gel dental gel Generic drug:  sodium fluoride Place 1 application onto teeth daily.   sertraline 100 MG tablet Commonly known as:  ZOLOFT Take 100 mg by mouth at bedtime.   vitamin C 500 MG tablet Commonly known as:  ASCORBIC ACID Take 500 mg by mouth daily.   VITEYES AREDS FORMULA/LUTEIN Caps Take 1 capsule by mouth 2 (two) times daily.   warfarin 4 MG tablet Commonly known as:  COUMADIN Take as directed. If you are unsure how to take this medication, talk to your nurse or doctor. Original instructions:  TAKE AS DIRECTED BY COUMADIN CLINIC       Vitals:  04/17/18 2036 04/18/18 0546  BP: (!) 142/62 (!) 144/81  Pulse: 74 70  Resp: (!) 22 (!) 24  Temp: 98.1 F (36.7 C) 98.4 F (36.9 C)  SpO2: 96% 93%    Skin clean, dry and intact without evidence of skin break down, no evidence of skin tears noted. IV catheter discontinued intact. Site without signs and symptoms of complications. Dressing and pressure applied. Pt denies pain at this time. No complaints noted.  An After Visit Summary was printed and given to the patient. Patient escorted via [place], and D/C home via private auto. Work note given.  Caroline Hernandez A Caroline Hernandez

## 2018-04-18 NOTE — Progress Notes (Signed)
ANTICOAGULATION CONSULT NOTE - Initial Consult  Pharmacy Consult for warfarin Indication: atrial fibrillation  Allergies  Allergen Reactions  . Zosyn [Piperacillin Sod-Tazobactam So] Other (See Comments)    Renal failure    Patient Measurements: Height: 5\' 8"  (172.7 cm) Weight: 211 lb (95.7 kg) IBW/kg (Calculated) : 63.9   Vital Signs: Temp: 98.4 F (36.9 C) (08/08 1153) Temp Source: Oral (08/08 1153) BP: 141/70 (08/08 1153) Pulse Rate: 76 (08/08 1153)  Labs: Recent Labs    04/16/18 0434 04/17/18 0334 04/18/18 0413  HGB 9.6* 9.5* 9.6*  HCT 30.3* 29.7* 29.5*  PLT 162 163 163  LABPROT 26.8* 26.4* 26.0*  INR 2.50 2.45 2.40  CREATININE 4.64* 4.41* 3.56*    Estimated Creatinine Clearance: 17.8 mL/min (A) (by C-G formula based on SCr of 3.56 mg/dL (H)).   Medical History: Past Medical History:  Diagnosis Date  . Amputation of left lower extremity below knee ([place])   . Amputation of right lower extremity below knee ([place])   . CAD (coronary artery disease)    a. 2004 Cardiac Arrest/CABG x 3 (LIMA->LAD, VG->OM, VG->RCA);  b. 06/2015 lexiscan MV: no significant ischemia, EF 48%, low risk->Med Rx.  . Chronic combined systolic and diastolic CHF (congestive heart failure) ([place])    a. 12/2014 Echo: EF 45-50%;  b. 08/2015 Echo: EF 45-50%, ant, antsept HK, mildly dil LA, nl RV, mild-mod TR, sev PAH (4mmHg).  . CKD (chronic kidney disease), stage IV ([place])   . CLL (chronic lymphocytic leukemia) ([place])   . Diabetes mellitus without complication ([place])   . Essential hypertension   . GERD (gastroesophageal reflux disease)   . Hiatal hernia   . History of cardiac arrest    a. 2004.  Marland Kitchen Hyperlipidemia   . Ischemic cardiomyopathy    a. s/p MDT ICD (originally had 1655 lead-->gen change and lead revision ~ 2012 @ [facility]); b. 12/2014 Echo: EF 45-50%;  c.08/2015 Echo: EF 45-50%, ant, antsept HK. Nl RV.  Marland Kitchen PAD (peripheral artery disease) (H[facility])    a. s/p BKA  . Persistent atrial fibrillation  ([place])    a. Dx 12/2014.  CHA2DS2VASc = 6--> warfarin;  b. 09/2015 s/p DCCV-->on amio; c. s/p DCCV 04/25/16    Assessment: 72 year old female admitted for CAP. Patient takes warfarin for afib. Pharmacy has been consulted for warfarin dosing. The patient's home dose is 6 mg on Mon, Tues, Thurs, Fri and Sat and 4 mg on Wed and Sun.  8/4  INR 1.89    Warfarin 6mg  8/5  INR 2.11    Warfarin 6mg  8/6  INR 2.50    Warfarin 5mg  8/7  INR 2.45    Warfarin 5mg  8/8  INR 2.40  Goal of Therapy:  INR 2-3 Monitor platelets by anticoagulation protocol: Yes   Plan:  INR therapeutic. Continue pt home dose of 4mg  Wed and Sun and 6mg  all other days. INR in the AM  Ahan Eisenberger D Jamison Soward, Pharm.D, BCPS Clinical Pharmacist 04/18/2018 2:16 PM

## 2018-04-19 LAB — GLUCOSE, CAPILLARY: Glucose-Capillary: 115 mg/dL — ABNORMAL HIGH (ref 70–99)

## 2018-04-19 LAB — PROTIME-INR
INR: 2.08
Prothrombin Time: 23.2 seconds — ABNORMAL HIGH (ref 11.4–15.2)

## 2018-04-19 LAB — BASIC METABOLIC PANEL
ANION GAP: 8 (ref 5–15)
BUN: 48 mg/dL — ABNORMAL HIGH (ref 8–23)
CHLORIDE: 108 mmol/L (ref 98–111)
CO2: 24 mmol/L (ref 22–32)
Calcium: 8.3 mg/dL — ABNORMAL LOW (ref 8.9–10.3)
Creatinine, Ser: 2.7 mg/dL — ABNORMAL HIGH (ref 0.44–1.00)
GFR calc non Af Amer: 17 mL/min — ABNORMAL LOW (ref >60)
GFR, EST AFRICAN AMERICAN: 19 mL/min — AB (ref >60)
GLUCOSE: 123 mg/dL — AB (ref 70–99)
Potassium: 4.3 mmol/L (ref 3.5–5.1)
Sodium: 140 mmol/L (ref 135–145)

## 2018-04-19 NOTE — Discharge Instructions (Signed)
It was a pleasure meeting you during your hospitalization.  You came the hospital because you were having difficulty breathing. We found that you had a pneumonia. You also developed a kidney injury, probably due to dehydration, taking the lasix, and the vancomycin. Your kidneys have gotten a lot better.  For your pneumonia- please continued taking the antibiotic (Cefdinir) once a day for 3 more days (including today!).  For your kidneys- please DO NOT take the losartan for now, until you are seen by the kidney doctor in clinic. You should keep taking the Lasix twice a day because your kidneys got much better.  You had an ultrasound of your kidneys done, which showed a possible bladder mass. Please have your primary care doctor refer you to a urologist to see if you need any more work-up.   -Dr. Brett Albino

## 2018-04-19 NOTE — Care Management Note (Signed)
Case Management Note  Patient Details  Name: Caroline Hernandez MRN: [id number] Date of Birth: [birth year]   Discharge order in for today. Corene Cornea with [facility] notified  Subjective/Objective:                    Action/Plan:   Expected Discharge Date:  04/19/18               Expected Discharge Plan:  [place]  In-House Referral:     Discharge planning Services  CM Consult  Post Acute Care Choice:  Home Health Choice offered to:  Patient  DME Arranged:    DME Agency:     HH Arranged:  RN, PT [place] Agency:  [place]  Status of Service:  Completed, signed off  If discussed at [place] of Stay Meetings, dates discussed:    Additional Comments:  Beverly Sessions, RN 04/19/2018, 10:33 AM

## 2018-04-19 NOTE — Progress Notes (Signed)
04/19/2018 11:30 AM  [name] to be D/C'd Home per MD order.  Discussed prescriptions and follow up appointments with the patient. Prescriptions given to patient, medication list explained in detail. Pt verbalized understanding.  Allergies as of 04/19/2018      Reactions   Zosyn [piperacillin Sod-tazobactam So] Other (See Comments)   Renal failure      Medication List    STOP taking these medications   losartan 100 MG tablet Commonly known as:  COZAAR     TAKE these medications   albuterol 108 (90 Base) MCG/ACT inhaler Commonly known as:  PROVENTIL HFA;VENTOLIN HFA Inhale 2 puffs into the lungs every 6 (six) hours as needed for wheezing or shortness of breath.   amitriptyline 25 MG tablet Commonly known as:  ELAVIL Take 25 mg by mouth at bedtime.   amLODipine 10 MG tablet Commonly known as:  NORVASC Take 1 tablet (10 mg total) by mouth daily.   atorvastatin 40 MG tablet Commonly known as:  LIPITOR Take 40 mg by mouth at bedtime.   carvedilol 3.125 MG tablet Commonly known as:  COREG TAKE 1 TABLET BY MOUTH TWICE DAILY   cefdinir 300 MG capsule Commonly known as:  OMNICEF Take 1 capsule (300 mg total) by mouth daily.   furosemide 40 MG tablet Commonly known as:  LASIX TAKE 1 TABLET(40 MG) BY MOUTH TWICE DAILY   gabapentin 100 MG capsule Commonly known as:  NEURONTIN Take 100 mg by mouth 2 (two) times daily.   HM VITAMIN D3 4000 units Caps Generic drug:  Cholecalciferol Take 4,000 Units by mouth daily.   insulin aspart 100 UNIT/ML injection Commonly known as:  novoLOG Inject 5-10 Units into the skin 3 (three) times daily before meals. 10 units into the skin before breakfast then 5 units before lunch then 10 units before supper (evening meal)   iron polysaccharides 150 MG capsule Commonly known as:  NIFEREX Take 150 mg by mouth daily.   isosorbide mononitrate 30 MG 24 hr tablet Commonly known as:  IMDUR Take 2 tablets (60 mg total) by mouth 2 (two) times  daily. What changed:  additional instructions   LANTUS 100 UNIT/ML injection Generic drug:  insulin glargine Inject 14-38 Units into the skin 2 (two) times daily before a meal. 38 units into the skin before breakfast and 14 units before supper (evening meal)   nitroGLYCERIN 0.4 MG SL tablet Commonly known as:  NITROSTAT Place 1 tablet (0.4 mg total) under the tongue every 5 (five) minutes as needed for chest pain.   pantoprazole 40 MG tablet Commonly known as:  PROTONIX Take 40 mg by mouth at bedtime.   potassium chloride SA 20 MEQ tablet Commonly known as:  K-DUR,KLOR-CON TAKE 1 TABLET BY MOUTH TWICE DAILY What changed:    how much to take  how to take this  when to take this   PREVIDENT 5000 DRY MOUTH 1.1 % Gel dental gel Generic drug:  sodium fluoride Place 1 application onto teeth daily.   sertraline 100 MG tablet Commonly known as:  ZOLOFT Take 100 mg by mouth at bedtime.   vitamin C 500 MG tablet Commonly known as:  ASCORBIC ACID Take 500 mg by mouth daily.   VITEYES AREDS FORMULA/LUTEIN Caps Take 1 capsule by mouth 2 (two) times daily.   warfarin 4 MG tablet Commonly known as:  COUMADIN Take as directed. If you are unsure how to take this medication, talk to your nurse or doctor. Original instructions:  TAKE  AS DIRECTED BY COUMADIN CLINIC       Vitals:   04/18/18 1917 04/19/18 0533  BP: (!) 156/65 (!) 157/67  Pulse: 70 64  Resp: 16 20  Temp: 98.3 F (36.8 C) 97.8 F (36.6 C)  SpO2: 95% 96%    Skin clean, dry and intact without evidence of skin break down, no evidence of skin tears noted. IV catheter discontinued intact. Site without signs and symptoms of complications. Dressing and pressure applied. Pt denies pain at this time. No complaints noted.  An After Visit Summary was printed and given to the patient. Patient escorted via [place], and D/C home via private auto.  Caroline Hernandez

## 2018-04-19 NOTE — Discharge Summary (Signed)
[facility] [place] NAME: Caroline Hernandez    MR#:  [id number]  DATE OF BIRTH:  03/14/1946  DATE OF ADMISSION:  04/14/2018   ADMITTING PHYSICIAN: Gorden Harms, MD  DATE OF DISCHARGE: 04/19/18  PRIMARY CARE PHYSICIAN: System, Provider Not In   ADMISSION DIAGNOSIS:  Sepsis, due to unspecified organism ([place]) [A41.9] Community acquired pneumonia, unspecified laterality [J18.9] DISCHARGE DIAGNOSIS:  Active Problems:   CAP (community acquired pneumonia)  SECONDARY DIAGNOSIS:   Past Medical History:  Diagnosis Date  . Amputation of left lower extremity below knee ([place])   . Amputation of right lower extremity below knee ([place])   . CAD (coronary artery disease)    a. 2004 Cardiac Arrest/CABG x 3 (LIMA->LAD, VG->OM, VG->RCA);  b. 06/2015 lexiscan MV: no significant ischemia, EF 48%, low risk->Med Rx.  . Chronic combined systolic and diastolic CHF (congestive heart failure) ([place])    a. 12/2014 Echo: EF 45-50%;  b. 08/2015 Echo: EF 45-50%, ant, antsept HK, mildly dil LA, nl RV, mild-mod TR, sev PAH (2mmHg).  . CKD (chronic kidney disease), stage IV ([place])   . CLL (chronic lymphocytic leukemia) ([place])   . Diabetes mellitus without complication ([place])   . Essential hypertension   . GERD (gastroesophageal reflux disease)   . Hiatal hernia   . History of cardiac arrest    a. 2004.  [place] Hyperlipidemia   . Ischemic cardiomyopathy    a. s/p MDT ICD (originally had 9233 lead-->gen change and lead revision ~ 2012 @ [facility]); b. 12/2014 Echo: EF 45-50%;  c.08/2015 Echo: EF 45-50%, ant, antsept HK. Nl RV.  [place] PAD (peripheral artery disease) ([facility])    a. s/p BKA  . Persistent atrial fibrillation ([place])    a. Dx 12/2014.  CHA2DS2VASc = 6--> warfarin;  b. 09/2015 s/p DCCV-->on amio; c. s/p DCCV 04/25/16   HOSPITAL COURSE:   [name] is a 72 year old female with a PMH of CAD, CLL. HTN, HLD, chronic combined systolic and diastolic HF, bilateral BKAs who presented to the  ED with fever, shortness of breath, and productive cough. She was hypoxic to 85^ on room air. CXR showed bilateral pneumonia. She was admitted for further management.  E. Coli bacteremia 2/2 CAP- compounded by immunosuppression from CLL. Bacteremia could also have been due to UTI, but urine culture not collected on admission. - initially on rocephin/azithromycin, changed to vanc/mero (zosyn allergy), then changed to omnicef to cover both the lungs and the urine - repeat blood cultures on 8/6 with no growth - initially on oxygen, but was able to be weaned to room air. Unable to ambulate with pulse ox due to bilateral BKAs  Acute renal failure in chronic kidney disease stage IV- likely due to combination of dehydration, lasix use, and vancomycin. Cr peaked at 4.64 and then trended down to 2.70 on the day of discharge - losartan held during admission and not restarted on discharge - lasix continued on discharge - nephrology was consulted - given aggreesive IV fluids  Volume overload with history of systolic congestive heart failure- Last ECHO with EF 40-45%. Patient noted to have increased swelling of her stumps and was unable to get her prosthetic legs on. Lasix restarted at 40mg  po bid and her swelling greatly improved. - continue coreg and statin - held losartan for AKI - lasix restarted at 40mg  bid - [facility] RN ordered on discharge for lasix protocol at home  Leukocytosis with history of CLL- follows with  Dr. Selena Batten [facility]). WBC peaked at 52.8, but decreased to 37.6 on the day of discharge. Baseline WBCs in the 30s. - will need f/u on discharge  Hypertension- BPs mostly normotensive during admission - held losartan on discharge - continued coreg, norvasc, and imdur  Chronic diabetes mellitus type 2- she did have an episode of low blood sugar to 68 while not eating much. Improved as appetite improved.  - home insulin regimen continued on discharge  Chronic atrial fibrillation-  rate-controlled, INR therapeutic. - continued coumadin and coreg  Chronic morbid obesity - discussed lifestyle modification  DISCHARGE CONDITIONS:  E. Coli bacteremia 2/2 CAP Acute renal failure in CKD, resolved CLL Hypertension Type 2 diabetes Chronic a-fib Morbid obesity CONSULTS OBTAINED:  nephrology  DRUG ALLERGIES:   Allergies  Allergen Reactions  . Zosyn [Piperacillin Sod-Tazobactam So] Other (See Comments)    Renal failure   DISCHARGE MEDICATIONS:   Allergies as of 04/19/2018      Reactions   Zosyn [piperacillin Sod-tazobactam So] Other (See Comments)   Renal failure      Medication List    STOP taking these medications   losartan 100 MG tablet Commonly known as:  COZAAR     TAKE these medications   albuterol 108 (90 Base) MCG/ACT inhaler Commonly known as:  PROVENTIL HFA;VENTOLIN HFA Inhale 2 puffs into the lungs every 6 (six) hours as needed for wheezing or shortness of breath.   amitriptyline 25 MG tablet Commonly known as:  ELAVIL Take 25 mg by mouth at bedtime.   amLODipine 10 MG tablet Commonly known as:  NORVASC Take 1 tablet (10 mg total) by mouth daily.   atorvastatin 40 MG tablet Commonly known as:  LIPITOR Take 40 mg by mouth at bedtime.   carvedilol 3.125 MG tablet Commonly known as:  COREG TAKE 1 TABLET BY MOUTH TWICE DAILY   cefdinir 300 MG capsule Commonly known as:  OMNICEF Take 1 capsule (300 mg total) by mouth daily.   furosemide 40 MG tablet Commonly known as:  LASIX TAKE 1 TABLET(40 MG) BY MOUTH TWICE DAILY   gabapentin 100 MG capsule Commonly known as:  NEURONTIN Take 100 mg by mouth 2 (two) times daily.   HM VITAMIN D3 4000 units Caps Generic drug:  Cholecalciferol Take 4,000 Units by mouth daily.   insulin aspart 100 UNIT/ML injection Commonly known as:  novoLOG Inject 5-10 Units into the skin 3 (three) times daily before meals. 10 units into the skin before breakfast then 5 units before lunch then 10 units  before supper (evening meal)   iron polysaccharides 150 MG capsule Commonly known as:  NIFEREX Take 150 mg by mouth daily.   isosorbide mononitrate 30 MG 24 hr tablet Commonly known as:  IMDUR Take 2 tablets (60 mg total) by mouth 2 (two) times daily. What changed:  additional instructions   LANTUS 100 UNIT/ML injection Generic drug:  insulin glargine Inject 14-38 Units into the skin 2 (two) times daily before a meal. 38 units into the skin before breakfast and 14 units before supper (evening meal)   nitroGLYCERIN 0.4 MG SL tablet Commonly known as:  NITROSTAT Place 1 tablet (0.4 mg total) under the tongue every 5 (five) minutes as needed for chest pain.   pantoprazole 40 MG tablet Commonly known as:  PROTONIX Take 40 mg by mouth at bedtime.   potassium chloride SA 20 MEQ tablet Commonly known as:  K-DUR,KLOR-CON TAKE 1 TABLET BY MOUTH TWICE DAILY What changed:    how  much to take  how to take this  when to take this   PREVIDENT 5000 DRY MOUTH 1.1 % Gel dental gel Generic drug:  sodium fluoride Place 1 application onto teeth daily.   sertraline 100 MG tablet Commonly known as:  ZOLOFT Take 100 mg by mouth at bedtime.   vitamin C 500 MG tablet Commonly known as:  ASCORBIC ACID Take 500 mg by mouth daily.   VITEYES AREDS FORMULA/LUTEIN Caps Take 1 capsule by mouth 2 (two) times daily.   warfarin 4 MG tablet Commonly known as:  COUMADIN Take as directed. If you are unsure how to take this medication, talk to your nurse or doctor. Original instructions:  TAKE AS DIRECTED BY COUMADIN CLINIC      DISCHARGE INSTRUCTIONS:  1. F/u with PCP in 1-2 weeks 2. F/u with hem/onc in 1-2 weeks. 3. F/u in nephrology in 1-2 weeks. Lasix restarted at 40mg  bid. Losartan held on discharge. Please monitor BP, Cr, and volume status and restart lisinopril as needed. 4. Treated with empiric antibiotics while hospitalized and transitioned to [facility] on discharge for a total 7 day  course 5. Noted to have volume overload on the day of discharge after receiving aggressive IVFs for acute renal failure. Lasix restarted at 40mg  bid. [facility] RN ordered for home lasix protocol. DIET:  Heart healthy, diabetic diet DISCHARGE CONDITION:  Stable ACTIVITY:  Activity as tolerated OXYGEN:  Home Oxygen: No.  Oxygen Delivery: room air DISCHARGE LOCATION:  home   If you experience worsening of your admission symptoms, develop shortness of breath, life threatening emergency, suicidal or homicidal thoughts you must seek medical attention immediately by calling 911 or calling your MD immediately  if symptoms less severe.  You Must read complete instructions/literature along with all the possible adverse reactions/side effects for all the Medicines you take and that have been prescribed to you. Take any new Medicines after you have completely understood and accpet all the possible adverse reactions/side effects.   Please note  You were cared for by a hospitalist during your hospital stay. If you have any questions about your discharge medications or the care you received while you were in the hospital after you are discharged, you can call the unit and asked to speak with the hospitalist on call if the hospitalist that took care of you is not available. Once you are discharged, your primary care physician will handle any further medical issues. Please note that NO REFILLS for any discharge medications will be authorized once you are discharged, as it is imperative that you return to your primary care physician (or establish a relationship with a primary care physician if you do not have one) for your aftercare needs so that they can reassess your need for medications and monitor your lab values.    On the day of Discharge:  VITAL SIGNS:  Blood pressure (!) 157/67, pulse 64, temperature 97.8 F (36.6 C), temperature source Oral, resp. rate 20, height 5\' 8"  (1.727 m), weight 95.7 kg, SpO2 96  %. PHYSICAL EXAMINATION:  GENERAL:  72 y.o.-year-old patient lying in the bed with no acute distress.  EYES: Pupils equal, round, reactive to light and accommodation. No scleral icterus. Extraocular muscles intact.  HEENT: Head atraumatic, normocephalic. Oropharynx and nasopharynx clear.  NECK:  Supple, no jugular venous distention. No thyroid enlargement, no tenderness.  LUNGS: Normal breath sounds bilaterally, +faint crackles in the left lung base, No use of accessory muscles of respiration.  CARDIOVASCULAR: regular rate, irregularly irregular  rhythm. S1, S2 normal. No murmurs, rubs, or gallops.  ABDOMEN: Soft, non-tender, non-distended. Bowel sounds present. No organomegaly or mass.  EXTREMITIES: bilateral BKAs, no pitting edema NEUROLOGIC: Cranial nerves II through XII are intact. 5/5 muscle strength. Gait not assessed. PSYCHIATRIC: The patient is alert and oriented x 3.  SKIN: No obvious rash, lesion, or ulcer.  DATA REVIEW:   CBC Recent Labs  Lab 04/18/18 0413  WBC 37.6*  HGB 9.6*  HCT 29.5*  PLT 163    Chemistries  Recent Labs  Lab 04/14/18 1343  04/19/18 0727  NA 137   < > 140  K 5.0   < > 4.3  CL 102   < > 108  CO2 24   < > 24  GLUCOSE 119*   < > 123*  BUN 41*   < > 48*  CREATININE 3.35*   < > 2.70*  CALCIUM 8.5*   < > 8.3*  AST 22  --   --   ALT 12  --   --   ALKPHOS 74  --   --   BILITOT 1.8*  --   --    < > = values in this interval not displayed.     Microbiology Results  Results for orders placed or performed during the hospital encounter of 04/14/18  Culture, blood (Routine x 2)     Status: Abnormal   Collection Time: 04/14/18  1:44 PM  Result Value Ref Range Status   Specimen Description   Final    BLOOD BLOOD LEFT FOREARM Performed at [facility], [address] [place], [place] [address]    Special Requests   Final    BOTTLES DRAWN AEROBIC AND ANAEROBIC Blood Culture adequate volume Performed at [facility], [address] [place], [place] [address]    Culture  Setup Time   Final    GRAM NEGATIVE RODS IN BOTH AEROBIC AND ANAEROBIC BOTTLES CRITICAL RESULT CALLED TO, READ BACK BY AND VERIFIED WITH: DAVID BESANTI AT 0865 04/15/18.PMH Performed at [facility], [place] [address] [place], [place] [address]    Culture ESCHERICHIA COLI (A)  Final   Report Status 04/17/2018 FINAL  Final   Organism ID, Bacteria ESCHERICHIA COLI  Final      Susceptibility   Escherichia coli - MIC*    AMPICILLIN <=2 SENSITIVE Sensitive     CEFAZOLIN <=4 SENSITIVE Sensitive     CEFEPIME <=1 SENSITIVE Sensitive     CEFTAZIDIME <=1 SENSITIVE Sensitive     CEFTRIAXONE <=1 SENSITIVE Sensitive     CIPROFLOXACIN <=0.25 SENSITIVE Sensitive     GENTAMICIN <=1 SENSITIVE Sensitive     IMIPENEM <=0.25 SENSITIVE Sensitive     TRIMETH/SULFA <=20 SENSITIVE Sensitive     AMPICILLIN/SULBACTAM <=2 SENSITIVE Sensitive     PIP/TAZO <=4 SENSITIVE Sensitive     Extended ESBL NEGATIVE Sensitive     * ESCHERICHIA COLI  Culture, blood (Routine x 2)     Status: Abnormal   Collection Time: 04/14/18  1:44 PM  Result Value Ref Range Status   Specimen Description   Final    BLOOD BLOOD RIGHT FOREARM Performed at [facility], [address] [place],  [address]    Special Requests   Final    BOTTLES DRAWN AEROBIC AND ANAEROBIC Blood Culture adequate volume Performed at [facility], [address] [place],  [address]    Culture  Setup Time   Final    Lonell Grandchild  NEGATIVE RODS IN BOTH AEROBIC AND ANAEROBIC BOTTLES CRITICAL RESULT CALLED TO, READ BACK BY AND VERIFIED WITH: DAVID BESANTI AT 7169 04/15/18.PMH Performed at [facility], [address] [place], [place] [address]    Culture (A)  Final    ESCHERICHIA COLI SUSCEPTIBILITIES PERFORMED ON PREVIOUS CULTURE WITHIN THE LAST 5 DAYS. Performed at  [facility], [place] [address], [place], [place] [address]    Report Status 04/17/2018 FINAL  Final    Blood Culture ID Panel (Reflexed)     Status: Abnormal   Collection Time: 04/14/18  1:44 PM  Result Value Ref Range Status   Enterococcus species NOT DETECTED NOT DETECTED Final   Vancomycin resistance NOT DETECTED NOT DETECTED Final   Listeria monocytogenes NOT DETECTED NOT DETECTED Final   Staphylococcus species NOT DETECTED NOT DETECTED Final   Staphylococcus aureus NOT DETECTED NOT DETECTED Final   Methicillin resistance NOT DETECTED NOT DETECTED Final   Streptococcus species NOT DETECTED NOT DETECTED Final   Streptococcus agalactiae NOT DETECTED NOT DETECTED Final   Streptococcus pneumoniae NOT DETECTED NOT DETECTED Final   Streptococcus pyogenes NOT DETECTED NOT DETECTED Final   Acinetobacter baumannii NOT DETECTED NOT DETECTED Final   Enterobacteriaceae species DETECTED (A) NOT DETECTED Final    Comment: CRITICAL RESULT CALLED TO, READ BACK BY AND VERIFIED WITH: DAVID BESANTI AT 5102 04/15/18.PMH Enterobacteriaceae represent a large family of gram-negative bacteria, not a single organism.    Enterobacter cloacae complex NOT DETECTED NOT DETECTED Final   Escherichia coli DETECTED (A) NOT DETECTED Final    Comment: CRITICAL RESULT CALLED TO, READ BACK BY AND VERIFIED WITH: DAVID BESANTI AT 5852 04/15/18.PMH    Klebsiella oxytoca NOT DETECTED NOT DETECTED Final   Klebsiella pneumoniae NOT DETECTED NOT DETECTED Final   Proteus species NOT DETECTED NOT DETECTED Final   Serratia marcescens NOT DETECTED NOT DETECTED Final   Carbapenem resistance NOT DETECTED NOT DETECTED Final   Haemophilus influenzae NOT DETECTED NOT DETECTED Final   Neisseria meningitidis NOT DETECTED NOT DETECTED Final   Pseudomonas aeruginosa NOT DETECTED NOT DETECTED Final   Candida albicans NOT DETECTED NOT DETECTED Final   Candida glabrata NOT DETECTED NOT DETECTED Final   Candida krusei NOT DETECTED NOT DETECTED Final   Candida parapsilosis NOT DETECTED NOT DETECTED Final   Candida tropicalis NOT DETECTED  NOT DETECTED Final    Comment: Performed at [facility], [address] [place], [place] [address]  MRSA PCR Screening     Status: Abnormal   Collection Time: 04/16/18  5:47 AM  Result Value Ref Range Status   MRSA by PCR POSITIVE (A) NEGATIVE Final    Comment:        The GeneXpert MRSA Assay (FDA approved for NASAL specimens only), is one component of a comprehensive MRSA colonization surveillance program. It is not intended to diagnose MRSA infection nor to guide or monitor treatment for MRSA infections. RESULT CALLED TO, READ BACK BY AND VERIFIED WITH: OLIVIA ROGERS AT 2353 ON 04/16/18 [place]. Performed at [facility], [address] [place], [place] [address]   CULTURE, BLOOD (ROUTINE X 2) w Reflex to ID Panel     Status: None (Preliminary result)   Collection Time: 04/16/18  1:18 PM  Result Value Ref Range Status   Specimen Description BLOOD RIGHT HAND  Final   Special Requests   Final    BOTTLES DRAWN AEROBIC AND ANAEROBIC Blood Culture adequate volume   Culture   Final  NO GROWTH 3 DAYS Performed at [facility], [address] [place], [place] [address]    Report Status PENDING  Incomplete  CULTURE, BLOOD (ROUTINE X 2) w Reflex to ID Panel     Status: None (Preliminary result)   Collection Time: 04/16/18  1:18 PM  Result Value Ref Range Status   Specimen Description BLOOD LEFT HAND  Final   Special Requests   Final    BOTTLES DRAWN AEROBIC AND ANAEROBIC Blood Culture adequate volume   Culture   Final    NO GROWTH 3 DAYS Performed at [facility], [address], [place], [place] [address]    Report Status PENDING  Incomplete    RADIOLOGY:  No results found.   Management plans discussed with the patient, family and they are in agreement.  CODE STATUS: Full Code   TOTAL TIME TAKING CARE OF THIS PATIENT: 35 minutes.    Berna Spare Lawson Mahone M.D on 04/19/2018 at 9:06 AM  Between 7am to 6pm - Pager - [phone]  After 6pm  go to [email or web address] - Proofreader  [facility]  Office  [phone]  CC: Primary care physician; System, Provider Not In   Note: This dictation was prepared with Dragon dictation along with smaller phrase technology. Any transcriptional errors that result from this process are unintentional.

## 2018-04-19 NOTE — Care Management Important Message (Signed)
Important Message  Patient Details  Name: Caroline Hernandez MRN: [id number] Date of Birth: 1946/08/26   Medicare Important Message Given:  Yes    Beverly Sessions, RN 04/19/2018, 11:18 AM

## 2018-04-21 LAB — CULTURE, BLOOD (ROUTINE X 2)
CULTURE: NO GROWTH
CULTURE: NO GROWTH
SPECIAL REQUESTS: ADEQUATE
SPECIAL REQUESTS: ADEQUATE

## 2018-05-06 ENCOUNTER — Other Ambulatory Visit: Payer: Self-pay | Admitting: Cardiovascular Disease

## 2018-05-07 NOTE — Telephone Encounter (Signed)
Refill Request.  

## 2018-05-08 ENCOUNTER — Ambulatory Visit (INDEPENDENT_AMBULATORY_CARE_PROVIDER_SITE_OTHER): Payer: Medicare HMO

## 2018-05-08 DIAGNOSIS — Z7901 Long term (current) use of anticoagulants: Secondary | ICD-10-CM

## 2018-05-08 DIAGNOSIS — Z5181 Encounter for therapeutic drug level monitoring: Secondary | ICD-10-CM

## 2018-05-08 DIAGNOSIS — I481 Persistent atrial fibrillation: Secondary | ICD-10-CM | POA: Diagnosis not present

## 2018-05-08 DIAGNOSIS — I4819 Other persistent atrial fibrillation: Secondary | ICD-10-CM

## 2018-05-08 LAB — POCT INR: INR: 1.7 — AB (ref 2.0–3.0)

## 2018-05-08 NOTE — Patient Instructions (Signed)
Please take 2 tablets tonight, then continue dosage 6 mg daily except 4mg  on Sundays and Thursdays.   Recheck in 4 weeks.

## 2018-05-16 NOTE — Progress Notes (Signed)
Patient ID: Caroline Hernandez, female   DOB: Jul 28, 1946, 72 y.o.   MRN: [id number] Cardiology Office Note  Date:  05/17/2018   ID:  Caroline Hernandez, DOB 1945/10/24, MRN [id number]  PCP:  System, Provider Not In   Chief Complaint  Patient presents with  . OTHER    Afib. Meds reviewed verbally with pt.    HPI:  72 y.o. 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,  atrial fibrillation starting possibly in April 2016,  noted again June 2016,  acute on chronic diastolic CHF initially April 2016,   admitted to the hospital with diastolic CHF/leg swelling,  abdominal bloating, angina,  She has bilateral prosthesis She has pacer ICD ablation for atrial fibrillation 07/18/2016 who presents For routine follow-up of her atrial fibrillation  Was in the hospital with UTI Then back in the hospital with [facility] records reviewed with the patient in detail Back into atrial fib, last EKG was in NSR E. Coli bacteremia 2/2 CAP- compounded by immunosuppression from CLL. Bacteremia could also have been due to UTI, but urine culture not collected on admission. Acute renal failure in chronic kidney disease stage IV- likely due to combination of dehydration, lasix use, and vancomycin. Cr peaked at 4.64 and then trended down to 2.70 on the day of discharge Volume overload with history of systolic congestive heart failure- Last ECHO with EF 40-45%. Patient noted to have increased swelling of her stumps and was unable to get her prosthetic legs on. Lasix restarted at 40mg  po bid and her swelling greatly improved. Leukocytosis with history of CLL- follows with Dr. Selena Batten [facility]). WBC peaked at 52.8, but decreased to 37.6 on the day of discharge. Baseline WBCs in the 30s. Chronic diabetes mellitus type 2- she did have an episode of low blood sugar to 68 while not eating much. Improved as appetite improved.   Discharge from the  hospital in atrial fibrillation  In follow-up today reports that she is improving Presents in a wheelchair Denies any significant tachycardia or palpitations concerning for atrial fibrillation Denies significant leg swelling Reports blood pressure well controlled at home  Previous history discussed with her ablation for atrial fibrillation 07/18/2016 10/09/2016:  Cardioversion  Hemoglobin A1c greater than 10, trying to watch her diet   TEE: 40% to 45% Mild to mod TR  EKG personally reviewed by myself on todays visit Shows atrial fibrillation ventricular rate 52 bpm poor R wave progression through the anterior precordial leads, left axis  Other past medical history 07/18/2016: 1.  Electrophysiology study and radiofrequency catheter ablation on 07/18/16 by Dr Thompson Grayer.  This study demonstrated  Successful electrical isolation and anatomical encircling of all four pulmonary veins with radiofrequency current.  Additional ablation along the posterior wall of the left atrium   Atrial fibrillation successfully cardioverted to sinus rhythm. Prolonged AV delay noted.  Other past medical history reviewed She underwent cardioversion at the end of January 2017 for persistent atrial fibrillation Maintaining normal sinus rhythm early February (amiodarone decreased down to 100 mg at that time), and in March when seen by Dr. Caryl Comes Reports having some shortness of breath, worsening leg swelling over the past month or 2 Weight has increased from low 180 pounds up to 191 pounds at which time she increased Lasix up to 40 mg twice a day Mild improvement in her weight, down 4 pounds Still with significant fatigue, leg swelling, shortness  of breath on exertion  History of coronary disease with cardiac arrest in 2004, taken for three-vessel CABG, LIMA to the LAD, vein graft to the RCA, vein graft to an OM. No stenting or intervention since that time.  chronic infections of her legs leading to  amputation. In hindsight family wonders if this could've been exacerbated by CLL.   She reports having problems in the past with anemia. Started on iron supplementation with improvement  PMH:   has a past medical history of Amputation of left lower extremity below knee ([place]), Amputation of right lower extremity below knee ([place]), CAD (coronary artery disease), Chronic combined systolic and diastolic CHF (congestive heart failure) ([place]), CKD (chronic kidney disease), stage IV ([place]), CLL (chronic lymphocytic leukemia) ([place]), Diabetes mellitus without complication ([place]), Essential hypertension, GERD (gastroesophageal reflux disease), Hiatal hernia, History of cardiac arrest, Hyperlipidemia, Ischemic cardiomyopathy, PAD (peripheral artery disease) ([place]), and Persistent atrial fibrillation ([place]).  PSH:    Past Surgical History:  Procedure Laterality Date  . ABDOMINAL HYSTERECTOMY    . Amputation lower extremity bilaterally Bilateral   . CARDIAC CATHETERIZATION    . CARDIOVERSION N/A 10/09/2016   Procedure: CARDIOVERSION;  Surgeon: Lelon Perla, MD;  Location: [facility] ENDOSCOPY;  Service: Cardiovascular;  Laterality: N/A;  . CORONARY ARTERY BYPASS GRAFT    . ELECTROPHYSIOLOGIC STUDY N/A 10/07/2015   Procedure: CARDIOVERSION;  Surgeon: Minna Merritts, MD;  Location: [facility] ORS;  Service: Cardiovascular;  Laterality: N/A;  . ELECTROPHYSIOLOGIC STUDY N/A 04/25/2016   Procedure: Cardioversion;  Surgeon: Minna Merritts, MD;  Location: [facility] ORS;  Service: Cardiovascular;  Laterality: N/A;  . ELECTROPHYSIOLOGIC STUDY N/A 07/18/2016   Procedure: Atrial Fibrillation Ablation;  Surgeon: Thompson Grayer, MD;  Location: [facility] CV LAB;  Service: Cardiovascular;  Laterality: N/A;  . IMPLANTABLE CARDIOVERTER DEFIBRILLATOR IMPLANT  2005   [employer]   . TEE WITHOUT CARDIOVERSION N/A 07/18/2016   Procedure: TRANSESOPHAGEAL ECHOCARDIOGRAM (TEE);  Surgeon: Sueanne Margarita, MD;  Location: [facility] ENDOSCOPY;  Service:  Cardiovascular;  Laterality: N/A;  . TEE WITHOUT CARDIOVERSION N/A 10/09/2016   Procedure: TRANSESOPHAGEAL ECHOCARDIOGRAM (TEE);  Surgeon: Lelon Perla, MD;  Location: [facility] ENDOSCOPY;  Service: Cardiovascular;  Laterality: N/A;    Current Outpatient Medications  Medication Sig Dispense Refill  . albuterol (PROVENTIL HFA;VENTOLIN HFA) 108 (90 Base) MCG/ACT inhaler Inhale 2 puffs into the lungs every 6 (six) hours as needed for wheezing or shortness of breath. 1 Inhaler 0  . amitriptyline (ELAVIL) 25 MG tablet Take 25 mg by mouth at bedtime.   11  . amLODipine (NORVASC) 10 MG tablet Take 1 tablet (10 mg total) by mouth daily. 90 tablet 3  . atorvastatin (LIPITOR) 40 MG tablet Take 40 mg by mouth at bedtime.   11  . carvedilol (COREG) 3.125 MG tablet TAKE 1 TABLET BY MOUTH TWICE DAILY 60 tablet 3  . cefdinir (OMNICEF) 300 MG capsule Take 1 capsule (300 mg total) by mouth daily. 3 capsule 0  . Cholecalciferol (HM VITAMIN D3) 4000 UNITS CAPS Take 4,000 Units by mouth daily.    . furosemide (LASIX) 40 MG tablet TAKE 1 TABLET(40 MG) BY MOUTH TWICE DAILY 60 tablet 3  . gabapentin (NEURONTIN) 100 MG capsule Take 100 mg by mouth 2 (two) times daily.    . insulin aspart (NOVOLOG) 100 UNIT/ML injection Inject 5-10 Units into the skin 3 (three) times daily before meals. 10 units into the skin before breakfast then 5 units before lunch then 10 units before supper (evening meal)    .  insulin glargine (LANTUS) 100 UNIT/ML injection Inject 14-38 Units into the skin 2 (two) times daily before a meal. 38 units into the skin before breakfast and 14 units before supper (evening meal)    . iron polysaccharides (NIFEREX) 150 MG capsule Take 150 mg by mouth daily.    . isosorbide mononitrate (IMDUR) 30 MG 24 hr tablet Take 2 tablets (60 mg total) by mouth 2 (two) times daily. 90 tablet 0  . losartan (COZAAR) 100 MG tablet TAKE 1 TABLET BY MOUTH EVERY DAY    . Multiple Vitamins-Minerals (VITEYES AREDS FORMULA/LUTEIN)  CAPS Take 1 capsule by mouth 2 (two) times daily.    . nitroGLYCERIN (NITROSTAT) 0.4 MG SL tablet Place 1 tablet (0.4 mg total) under the tongue every 5 (five) minutes as needed for chest pain. 30 tablet 0  . pantoprazole (PROTONIX) 40 MG tablet Take 40 mg by mouth at bedtime.    . potassium chloride SA (K-DUR,KLOR-CON) 20 MEQ tablet TAKE 1 TABLET BY MOUTH TWICE DAILY (Patient taking differently: TAKE 1 TABLET BY MOUTH ONCE DAILY) 180 tablet 0  . PREVIDENT 5000 DRY MOUTH 1.1 % GEL dental gel Place 1 application onto teeth daily.   5  . sertraline (ZOLOFT) 100 MG tablet Take 100 mg by mouth at bedtime.   11  . vitamin C (ASCORBIC ACID) 500 MG tablet Take 500 mg by mouth daily.    Marland Kitchen warfarin (COUMADIN) 4 MG tablet TAKE AS DIRECTED BY COUMADIN CLINIC 50 tablet 0   No current facility-administered medications for this visit.      Allergies:   Zosyn [piperacillin sod-tazobactam so]   Social History:  The patient  reports that she has never smoked. She has never used smokeless tobacco. She reports that she does not drink alcohol or use drugs.   Family History:   family history includes Atrial fibrillation in her mother; CAD in her mother; Diabetes in her father and mother; Lung cancer in her father.    Review of Systems: Review of Systems  Constitutional: Negative.   Respiratory: Negative.   Cardiovascular: Positive for leg swelling.  Gastrointestinal: Negative.   Musculoskeletal: Negative.   Neurological: Negative.   Psychiatric/Behavioral: Negative.   All other systems reviewed and are negative.    PHYSICAL EXAM: VS:  BP 138/62 (BP Location: Left Arm, Patient Position: Sitting, Cuff Size: Normal)   Pulse (!) 52   Ht 5\' 8"  (1.727 m)   Wt 211 lb (95.7 kg)   BMI 32.08 kg/m  , BMI Body mass index is 32.08 kg/m. Constitutional:  oriented to person, place, and time. No distress. Presents in a wheelchair HENT:  Head: Normocephalic and atraumatic.  Eyes:  no discharge. No scleral  icterus.  Neck: Normal range of motion. Neck supple. No JVD present.  Cardiovascular: Normal rate, regular rhythm, normal heart sounds and intact distal pulses. Exam reveals no gallop and no friction rub. No edema No murmur heard. Pulmonary/Chest: Effort normal and breath sounds normal. No stridor. No respiratory distress.  no wheezes.  no rales.  no tenderness.  Abdominal: Soft.  no distension.  no tenderness.  Musculoskeletal: Normal range of motion.  no  tenderness or deformity. Bilateral amputation above the knee Neurological:  normal muscle tone. Coordination normal. No atrophy Skin: Skin is warm and dry. No rash noted. not diaphoretic.  Psychiatric:  normal mood and affect. behavior is normal. Thought content normal.    Recent Labs: 04/14/2018: ALT 12 04/18/2018: Hemoglobin 9.6; Platelets 163 04/19/2018: BUN 48; Creatinine, Ser  2.70; Potassium 4.3; Sodium 140    Lipid Panel Lab Results  Component Value Date   CHOL 91 06/13/2015   HDL 25 (L) 06/13/2015   LDLCALC 40 06/13/2015   TRIG 128 06/13/2015      Wt Readings from Last 3 Encounters:  05/17/18 211 lb (95.7 kg)  04/14/18 211 lb (95.7 kg)  03/10/18 244 lb 14.9 oz (111.1 kg)     ASSESSMENT AND PLAN:  SOB (shortness of breath) -  Breathing stable on Lasix twice a day  extra Lasix as needed for worsening leg swelling or abdominal bloating  Persistent atrial fibrillation (HCC) -  History of ablation, cardioversion January 2018,  Onset of atrial fibrillation in the setting of pneumonia Rate is running low Long discussion with her concerning various treatment options She is uncertain if she would like to restore normal sinus rhythm Options include cardioversion Not a good candidate to start antiarrhythmics given the bradycardia She will call us back if she would like a procedure, currently asymptomatic  Leg swelling Is having significant leg swelling No abdominal bloating  Coronary artery disease involving native  coronary artery of native heart with angina pectoris with documented spasm ([place]) Currently with no symptoms of angina. No further workup at this time. Continue current medication regimen.  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 No changes made to the medications.   Hospital records reviewed  Total encounter time more than 45 minutes  Greater than 50% was spent in counseling and coordination of care with the patient   Disposition:   F/U  12 months   Orders Placed This Encounter  Procedures  . EKG 12-Lead     Signed, Esmond Plants, M.D., Ph.D. 05/17/2018  [place], [place]

## 2018-05-17 ENCOUNTER — Encounter: Payer: Self-pay | Admitting: Cardiovascular Disease

## 2018-05-17 ENCOUNTER — Ambulatory Visit: Payer: Medicare HMO | Admitting: Cardiovascular Disease

## 2018-05-17 VITALS — BP 138/62 | HR 52 | Ht 68.0 in | Wt 211.0 lb

## 2018-05-17 DIAGNOSIS — N184 Chronic kidney disease, stage 4 (severe): Secondary | ICD-10-CM

## 2018-05-17 DIAGNOSIS — I481 Persistent atrial fibrillation: Secondary | ICD-10-CM

## 2018-05-17 DIAGNOSIS — I25118 Atherosclerotic heart disease of native coronary artery with other forms of angina pectoris: Secondary | ICD-10-CM

## 2018-05-17 DIAGNOSIS — I4819 Other persistent atrial fibrillation: Secondary | ICD-10-CM

## 2018-05-17 DIAGNOSIS — I5043 Acute on chronic combined systolic (congestive) and diastolic (congestive) heart failure: Secondary | ICD-10-CM

## 2018-05-17 DIAGNOSIS — I10 Essential (primary) hypertension: Secondary | ICD-10-CM | POA: Diagnosis not present

## 2018-05-17 DIAGNOSIS — R0602 Shortness of breath: Secondary | ICD-10-CM

## 2018-05-17 DIAGNOSIS — I259 Chronic ischemic heart disease, unspecified: Secondary | ICD-10-CM

## 2018-05-17 DIAGNOSIS — Z95810 Presence of automatic (implantable) cardiac defibrillator: Secondary | ICD-10-CM

## 2018-05-17 NOTE — Patient Instructions (Signed)

## 2018-06-05 ENCOUNTER — Ambulatory Visit (INDEPENDENT_AMBULATORY_CARE_PROVIDER_SITE_OTHER): Payer: Medicare HMO

## 2018-06-05 DIAGNOSIS — I481 Persistent atrial fibrillation: Secondary | ICD-10-CM

## 2018-06-05 DIAGNOSIS — Z7901 Long term (current) use of anticoagulants: Secondary | ICD-10-CM | POA: Diagnosis not present

## 2018-06-05 DIAGNOSIS — Z5181 Encounter for therapeutic drug level monitoring: Secondary | ICD-10-CM

## 2018-06-05 DIAGNOSIS — I4819 Other persistent atrial fibrillation: Secondary | ICD-10-CM

## 2018-06-05 LAB — POCT INR: INR: 2 (ref 2.0–3.0)

## 2018-06-05 NOTE — Patient Instructions (Signed)
Please continue dosage 6 mg daily except 4mg  on Sundays and Thursdays.   Recheck in 3 weeks.

## 2018-06-07 ENCOUNTER — Other Ambulatory Visit: Payer: Self-pay | Admitting: Cardiovascular Disease

## 2018-06-07 NOTE — Telephone Encounter (Signed)
Please review for refill. Thanks!  

## 2018-06-11 ENCOUNTER — Other Ambulatory Visit: Payer: Self-pay | Admitting: Cardiovascular Disease

## 2018-06-17 ENCOUNTER — Telehealth: Payer: Self-pay | Admitting: Cardiovascular Disease

## 2018-06-17 NOTE — Telephone Encounter (Signed)
Pt wanted to let us know she has stopped her coumadin 10/5 due to her surgery.

## 2018-06-17 NOTE — Telephone Encounter (Addendum)
Left message for pt to call back.  Advised her that I need to know what type of surgery she is having, when it is scheduled (it doesn't look like Dr. Rockey Situ gave cardiac clearance to stop coumadin), and that she will need to take 1/2 tab extra x 2 days when she is cleared to resume coumadin. Asked her to call back to discuss all of this.   Per Care Everywhere, pt is sched for cystocopy/TURBT on 10/9 @ [facility] w/ Dr. Brendia Sacks. He advised her to stop (coumadin) 5 days before surgery-LAST DOSE 06-13-18.

## 2018-06-20 NOTE — Telephone Encounter (Signed)
Called the pt and she stated she held her Coumadin 5 days prior to procedure and was told by her MD to hold her Coumadin until Friday, then on Friday to resume her normal dose. She had her procedure yesterday and states no problems. Pt will have her INR checked on Wednesday in the [facility]. She states she is not having any bleeding from the biopsy she had. Pt verbalized understanding of the plan to have INR checked.

## 2018-06-20 NOTE — Telephone Encounter (Signed)
Pt is returning the call.  

## 2018-07-01 ENCOUNTER — Ambulatory Visit (INDEPENDENT_AMBULATORY_CARE_PROVIDER_SITE_OTHER): Payer: Medicare HMO

## 2018-07-01 DIAGNOSIS — I4819 Other persistent atrial fibrillation: Secondary | ICD-10-CM

## 2018-07-01 DIAGNOSIS — Z5181 Encounter for therapeutic drug level monitoring: Secondary | ICD-10-CM | POA: Diagnosis not present

## 2018-07-01 DIAGNOSIS — Z23 Encounter for immunization: Secondary | ICD-10-CM

## 2018-07-01 DIAGNOSIS — Z7901 Long term (current) use of anticoagulants: Secondary | ICD-10-CM

## 2018-07-01 LAB — POCT INR: INR: 1.5 — AB (ref 2.0–3.0)

## 2018-07-01 NOTE — Patient Instructions (Signed)
Please take extra 1/2 tablet today & tomorrow, then continue dosage 6 mg daily except 4mg  on Sundays and Thursdays.   Recheck in 2 weeks.

## 2018-07-07 ENCOUNTER — Other Ambulatory Visit: Payer: Self-pay | Admitting: Cardiovascular Disease

## 2018-07-08 NOTE — Telephone Encounter (Signed)
Please review for refill.  

## 2018-07-11 ENCOUNTER — Other Ambulatory Visit: Payer: Self-pay | Admitting: Cardiovascular Disease

## 2018-07-17 ENCOUNTER — Ambulatory Visit (INDEPENDENT_AMBULATORY_CARE_PROVIDER_SITE_OTHER): Payer: Medicare HMO

## 2018-07-17 DIAGNOSIS — Z7901 Long term (current) use of anticoagulants: Secondary | ICD-10-CM

## 2018-07-17 DIAGNOSIS — I4819 Other persistent atrial fibrillation: Secondary | ICD-10-CM

## 2018-07-17 DIAGNOSIS — Z5181 Encounter for therapeutic drug level monitoring: Secondary | ICD-10-CM | POA: Diagnosis not present

## 2018-07-17 LAB — POCT INR: INR: 1.6 — AB (ref 2.0–3.0)

## 2018-07-17 NOTE — Patient Instructions (Signed)
Please take 2 tablets today & tomorrow, then START NEW DOSAGE of 1.5 tablets every day. Recheck in 1 week.

## 2018-07-24 ENCOUNTER — Ambulatory Visit (INDEPENDENT_AMBULATORY_CARE_PROVIDER_SITE_OTHER): Payer: Medicare HMO

## 2018-07-24 DIAGNOSIS — I4819 Other persistent atrial fibrillation: Secondary | ICD-10-CM | POA: Diagnosis not present

## 2018-07-24 DIAGNOSIS — Z7901 Long term (current) use of anticoagulants: Secondary | ICD-10-CM | POA: Diagnosis not present

## 2018-07-24 DIAGNOSIS — Z5181 Encounter for therapeutic drug level monitoring: Secondary | ICD-10-CM

## 2018-07-24 LAB — POCT INR: INR: 2.1 (ref 2.0–3.0)

## 2018-07-24 NOTE — Patient Instructions (Signed)
Please continue dosage of 1.5 tablets every day. Recheck in 2 weeks.

## 2018-08-04 ENCOUNTER — Other Ambulatory Visit: Payer: Self-pay | Admitting: Cardiovascular Disease

## 2018-08-05 NOTE — Telephone Encounter (Signed)
Please review for refill, Thanks !  

## 2018-08-07 ENCOUNTER — Ambulatory Visit (INDEPENDENT_AMBULATORY_CARE_PROVIDER_SITE_OTHER): Payer: Medicare HMO

## 2018-08-07 DIAGNOSIS — Z5181 Encounter for therapeutic drug level monitoring: Secondary | ICD-10-CM | POA: Diagnosis not present

## 2018-08-07 DIAGNOSIS — Z7901 Long term (current) use of anticoagulants: Secondary | ICD-10-CM | POA: Diagnosis not present

## 2018-08-07 DIAGNOSIS — I4819 Other persistent atrial fibrillation: Secondary | ICD-10-CM

## 2018-08-07 LAB — POCT INR: INR: 1.8 — AB (ref 2.0–3.0)

## 2018-08-07 NOTE — Patient Instructions (Addendum)
Please take extra 1/2 tablet today and tomorrow , then continue dosage of 1.5 tablets every day. Recheck in 2 weeks.

## 2018-08-18 ENCOUNTER — Other Ambulatory Visit: Payer: Self-pay | Admitting: Cardiovascular Disease

## 2018-08-21 ENCOUNTER — Ambulatory Visit (INDEPENDENT_AMBULATORY_CARE_PROVIDER_SITE_OTHER): Payer: Medicare HMO

## 2018-08-21 DIAGNOSIS — Z5181 Encounter for therapeutic drug level monitoring: Secondary | ICD-10-CM | POA: Diagnosis not present

## 2018-08-21 DIAGNOSIS — Z7901 Long term (current) use of anticoagulants: Secondary | ICD-10-CM

## 2018-08-21 DIAGNOSIS — I4819 Other persistent atrial fibrillation: Secondary | ICD-10-CM

## 2018-08-21 LAB — POCT INR: INR: 2.2 (ref 2.0–3.0)

## 2018-08-21 NOTE — Patient Instructions (Signed)
Please continue dosage of 1.5 tablets every day. Recheck in 3 weeks.

## 2018-09-02 ENCOUNTER — Ambulatory Visit (INDEPENDENT_AMBULATORY_CARE_PROVIDER_SITE_OTHER): Payer: Medicare HMO

## 2018-09-02 DIAGNOSIS — I4819 Other persistent atrial fibrillation: Secondary | ICD-10-CM | POA: Diagnosis not present

## 2018-09-02 DIAGNOSIS — Z5181 Encounter for therapeutic drug level monitoring: Secondary | ICD-10-CM | POA: Diagnosis not present

## 2018-09-02 DIAGNOSIS — Z7901 Long term (current) use of anticoagulants: Secondary | ICD-10-CM | POA: Diagnosis not present

## 2018-09-02 LAB — POCT INR: INR: 2.7 (ref 2.0–3.0)

## 2018-09-02 NOTE — Patient Instructions (Signed)
Please continue dosage of 1.5 tablets every day. Recheck in 3 weeks.

## 2018-09-20 ENCOUNTER — Other Ambulatory Visit: Payer: Self-pay | Admitting: Cardiovascular Disease

## 2018-09-20 NOTE — Telephone Encounter (Signed)
Please review for refill. Thanks!  

## 2018-09-22 ENCOUNTER — Other Ambulatory Visit: Payer: Self-pay | Admitting: Cardiovascular Disease

## 2018-09-23 ENCOUNTER — Other Ambulatory Visit: Payer: Self-pay | Admitting: Cardiovascular Disease

## 2018-09-23 NOTE — Telephone Encounter (Signed)
Please review for refill.  

## 2018-09-23 NOTE — Telephone Encounter (Signed)
Please review for refill, Thanks !  

## 2018-09-23 NOTE — Telephone Encounter (Signed)
Please review for refill, thanks ! 

## 2018-09-25 ENCOUNTER — Ambulatory Visit (INDEPENDENT_AMBULATORY_CARE_PROVIDER_SITE_OTHER): Payer: Medicare HMO

## 2018-09-25 DIAGNOSIS — Z5181 Encounter for therapeutic drug level monitoring: Secondary | ICD-10-CM | POA: Diagnosis not present

## 2018-09-25 DIAGNOSIS — Z7901 Long term (current) use of anticoagulants: Secondary | ICD-10-CM

## 2018-09-25 DIAGNOSIS — I4819 Other persistent atrial fibrillation: Secondary | ICD-10-CM | POA: Diagnosis not present

## 2018-09-25 LAB — POCT INR: INR: 1.8 — AB (ref 2.0–3.0)

## 2018-09-25 NOTE — Patient Instructions (Signed)
Please take 2 tablets tonight, then continue dosage of 1.5 tablets every day. Recheck in 2 weeks.

## 2018-09-30 NOTE — Progress Notes (Signed)
Cardiology Office Note Date:  10/02/2018  Patient ID:  Caroline Hernandez, Caroline Hernandez September 13, 1945, MRN [id number] PCP:  System, Provider Not In  Cardiologist:  Dr. Rockey Situ, MD Electrophysiologist: Dr. Caryl Comes, MD    Chief Complaint: Upper leg swelling, abdominal distention, shortness of breath, weight gain  History of Present Illness: Caroline Hernandez is a 73 y.o. female with history of CAD with cardiac arrest s/p 3-vessel CABG in 2004 with LIMA to LAD, SVG to OM, and SVG to RCA, chronic combined CHF, ICM s/p ICD, persistent Afib diagnosed in 2016 on Coumadin s/p DCCV in 09/2015 and 04/2016 with Mobitz type I noted on post tracing s/p RFCA in 07/2016 s/p repeat TEE/DCCV 09/2016, pulmonary hypertension, CLL, CKD stage IV, anemia of chronic disease, PAD s/p bilateral BKA, DM2, HTN, HLD, and GERD who presents for evaluation of volume overload.  Most recent ischemic evaluation via nuclear stress test in 06/2015 showed no significant ischemia with an old scar noted in the mid to distal anteroseptal and septal region, EF 48%, low risk scan. Most recent echo via TEE in 09/2016 showed an EF of 40-45%, mild MR, mildly dilated LA with mild spontaneous echo contrast in the LAA, large PFO with left to right shunting. She was admitted to the hospital in the summer of 2019 twice with UTI and PNA complicated by GNR bacteremia. She was last seen in the office in 05/2017 and was back in Afib. She was uncertain if she wanted to pursue rhythm control. She was not a candidate for AAD given baseline bradycardia as well as her ischemic heart disease.   Most recent INR from 09/25/2018 of 1.8.   Labs: 04/2018 - K+ 4.3, SCr 2.70, WBC 37.6, HGB 9.6, PLT 163, AST/ALT normal, albumin 3.7 02/2018 - A1c 9.1  Patient comes in accompanied by her daughter today.  She reports up until the past week and 1/2 to 2 weeks she had been doing quite well.  However approximately 2 weeks prior she began to develop upper leg edema (patient is status post bilateral  BKA), stump edema, abdominal distention, shortness of breath, and weight gain.  When patient was last evaluated in our office in 05/2017 she was noted to weigh 211 pounds.  She was seen by [facility] in 08/2018 and reported a weight of 214 pounds.  Since then, she has trended to a weight of 230 pounds by her home scale today and in our office today.  She denies any dietary changes and has been compliant with her medications, taking Lasix 40 mg twice daily and occasionally 3 times daily.  She intermittently notes some tachypalpitations.  No chest pain, orthopnea, dizziness, presyncope, or syncope.  She does note some early satiety.  No shocks from her device.  She is interested in pursuing repeat cardioversion when able.  Past Medical History:  Diagnosis Date  . Amputation of left lower extremity below knee ([place])   . Amputation of right lower extremity below knee ([place])   . CAD (coronary artery disease)    a. 2004 Cardiac Arrest/CABG x 3 (LIMA->LAD, VG->OM, VG->RCA);  b. 06/2015 lexiscan MV: no significant ischemia, EF 48%, low risk->Med Rx.  . Chronic combined systolic and diastolic CHF (congestive heart failure) ([place])    a. 12/2014 Echo: EF 45-50%;  b. 08/2015 Echo: EF 45-50%, ant, antsept HK, mildly dil LA, nl RV, mild-mod TR, sev PAH (49mmHg).  . CKD (chronic kidney disease), stage IV ([place])   . CLL (chronic lymphocytic leukemia) ([place])   .  Diabetes mellitus without complication ([place])   . Essential hypertension   . GERD (gastroesophageal reflux disease)   . Hiatal hernia   . History of cardiac arrest    a. 2004.  Marland Kitchen Hyperlipidemia   . Ischemic cardiomyopathy    a. s/p MDT ICD (originally had 9628 lead-->gen change and lead revision ~ 2012 @ [place]); b. 12/2014 Echo: EF 45-50%;  c.08/2015 Echo: EF 45-50%, ant, antsept HK. Nl RV.  Marland Kitchen PAD (peripheral artery disease) ([facility])    a. s/p BKA  . Persistent atrial fibrillation    a. Dx 12/2014.  CHA2DS2VASc = 6--> warfarin;  b.  09/2015 s/p DCCV-->on amio; c. s/p DCCV 04/25/16    Past Surgical History:  Procedure Laterality Date  . ABDOMINAL HYSTERECTOMY    . Amputation lower extremity bilaterally Bilateral   . CARDIAC CATHETERIZATION    . CARDIOVERSION N/A 10/09/2016   Procedure: CARDIOVERSION;  Surgeon: Lelon Perla, MD;  Location: [facility] ENDOSCOPY;  Service: Cardiovascular;  Laterality: N/A;  . CORONARY ARTERY BYPASS GRAFT    . ELECTROPHYSIOLOGIC STUDY N/A 10/07/2015   Procedure: CARDIOVERSION;  Surgeon: Minna Merritts, MD;  Location: [facility] ORS;  Service: Cardiovascular;  Laterality: N/A;  . ELECTROPHYSIOLOGIC STUDY N/A 04/25/2016   Procedure: Cardioversion;  Surgeon: Minna Merritts, MD;  Location: [facility] ORS;  Service: Cardiovascular;  Laterality: N/A;  . ELECTROPHYSIOLOGIC STUDY N/A 07/18/2016   Procedure: Atrial Fibrillation Ablation;  Surgeon: Thompson Grayer, MD;  Location: [place] CV LAB;  Service: Cardiovascular;  Laterality: N/A;  . IMPLANTABLE CARDIOVERTER DEFIBRILLATOR IMPLANT  2005   [employer]   . TEE WITHOUT CARDIOVERSION N/A 07/18/2016   Procedure: TRANSESOPHAGEAL ECHOCARDIOGRAM (TEE);  Surgeon: Sueanne Margarita, MD;  Location: [facility] ENDOSCOPY;  Service: Cardiovascular;  Laterality: N/A;  . TEE WITHOUT CARDIOVERSION N/A 10/09/2016   Procedure: TRANSESOPHAGEAL ECHOCARDIOGRAM (TEE);  Surgeon: Lelon Perla, MD;  Location: [facility] ENDOSCOPY;  Service: Cardiovascular;  Laterality: N/A;    Current Meds  Medication Sig  . albuterol (PROVENTIL HFA;VENTOLIN HFA) 108 (90 Base) MCG/ACT inhaler Inhale 2 puffs into the lungs every 6 (six) hours as needed for wheezing or shortness of breath.  Marland Kitchen amitriptyline (ELAVIL) 25 MG tablet Take 25 mg by mouth at bedtime.   Marland Kitchen amLODipine (NORVASC) 10 MG tablet TAKE 1 TABLET(10 MG) BY MOUTH DAILY  . atorvastatin (LIPITOR) 40 MG tablet Take 40 mg by mouth at bedtime.   . carvedilol (COREG) 3.125 MG tablet TAKE 1 TABLET BY MOUTH TWICE DAILY  . Cholecalciferol (HM VITAMIN D3) 4000  UNITS CAPS Take 4,000 Units by mouth daily.  Marland Kitchen gabapentin (NEURONTIN) 100 MG capsule Take 100 mg by mouth 2 (two) times daily.  . insulin aspart (NOVOLOG) 100 UNIT/ML injection Inject 5-10 Units into the skin 3 (three) times daily before meals. 10 units into the skin before breakfast then 5 units before lunch then 10 units before supper (evening meal)  . insulin glargine (LANTUS) 100 UNIT/ML injection Inject 14-38 Units into the skin 2 (two) times daily before a meal. 38 units into the skin before breakfast and 14 units before supper (evening meal)  . iron polysaccharides (NIFEREX) 150 MG capsule Take 150 mg by mouth daily.  . isosorbide mononitrate (IMDUR) 30 MG 24 hr tablet TAKE 2 TABLETS(60 MG) BY MOUTH TWICE DAILY  . losartan (COZAAR) 100 MG tablet TAKE 1 TABLET BY MOUTH EVERY DAY  . Multiple Vitamins-Minerals (VITEYES AREDS FORMULA/LUTEIN) CAPS Take 1 capsule by mouth 2 (two) times daily.  . nitroGLYCERIN (NITROSTAT) 0.4 MG  SL tablet Place 1 tablet (0.4 mg total) under the tongue every 5 (five) minutes as needed for chest pain.  . pantoprazole (PROTONIX) 40 MG tablet Take 40 mg by mouth at bedtime.  . potassium chloride SA (K-DUR,KLOR-CON) 20 MEQ tablet TAKE 1 TABLET BY MOUTH TWICE DAILY  . PREVIDENT 5000 DRY MOUTH 1.1 % GEL dental gel Place 1 application onto teeth daily.   . sertraline (ZOLOFT) 100 MG tablet Take 100 mg by mouth at bedtime.   . vitamin C (ASCORBIC ACID) 500 MG tablet Take 500 mg by mouth daily.  Marland Kitchen warfarin (COUMADIN) 4 MG tablet TAKE AS DIRECTED BY COUMADIN CLINIC  . warfarin (COUMADIN) 4 MG tablet TAKE AS DIRECTED BY COUMADIN CLINIC  . [DISCONTINUED] furosemide (LASIX) 40 MG tablet TAKE 1 TABLET(40 MG) BY MOUTH TWICE DAILY    Allergies:   Piperacillin; Piperacillin-tazobactam in dex; and Zosyn [piperacillin sod-tazobactam so]   Social History:  The patient  reports that she has never smoked. She has never used smokeless tobacco. She reports that she does not drink  alcohol or use drugs.   Family History:  The patient's family history includes Atrial fibrillation in her mother; CAD in her mother; Diabetes in her father and mother; Lung cancer in her father.  ROS:   Review of Systems  Constitutional: Positive for malaise/fatigue. Negative for chills, diaphoresis, fever and weight loss.  HENT: Negative for congestion.   Eyes: Negative for discharge and redness.  Respiratory: Positive for shortness of breath. Negative for cough, hemoptysis, sputum production and wheezing.   Cardiovascular: Positive for leg swelling. Negative for chest pain, palpitations, orthopnea, claudication and PND.  Gastrointestinal: Negative for abdominal pain, blood in stool, heartburn, melena, nausea and vomiting.  Genitourinary: Negative for hematuria.  Musculoskeletal: Negative for falls and myalgias.  Skin: Negative for rash.  Neurological: Positive for weakness. Negative for dizziness, tingling, tremors, sensory change, speech change, focal weakness and loss of consciousness.  Endo/Heme/Allergies: Does not bruise/bleed easily.  Psychiatric/Behavioral: Negative for substance abuse. The patient is not nervous/anxious.   All other systems reviewed and are negative.    PHYSICAL EXAM:   VS:  BP 130/60 (BP Location: Left Arm, Patient Position: Sitting, Cuff Size: Normal)   Pulse (!) 54   Ht 5\' 8"  (1.727 m)   Wt 230 lb (104.3 kg)   BMI 34.97 kg/m  BMI: Body mass index is 34.97 kg/m.  Physical Exam  Constitutional: She is oriented to person, place, and time. She appears well-developed and well-nourished.  HENT:  Head: Normocephalic and atraumatic.  Eyes: Right eye exhibits no discharge. Left eye exhibits no discharge.  Neck: Normal range of motion. JVD present.  JVD is elevated approximately 8 cm  Cardiovascular: Normal rate, S1 normal, S2 normal and normal heart sounds. An irregularly irregular rhythm present. Exam reveals no distant heart sounds, no friction rub, no  midsystolic click and no opening snap.  No murmur heard. Pulmonary/Chest: Effort normal. No respiratory distress. She has no decreased breath sounds. She has no wheezes. She has rales in the right lower field and the left lower field. She exhibits no tenderness.  Abdominal: Soft. She exhibits distension. There is no abdominal tenderness.  Musculoskeletal:        General: Edema present.     Comments: She is status post bilateral BKA with bilateral prostheses noted.  She has 1+ upper lower extremity pitting edema to the bilateral hips  Neurological: She is alert and oriented to person, place, and time.  Skin: Skin  is warm and dry. No cyanosis. Nails show no clubbing.  Psychiatric: She has a normal mood and affect. Her speech is normal and behavior is normal. Judgment and thought content normal.     EKG:  Was ordered and interpreted by me today. Shows Afib, 54 bpm, left axis deviation, nonspecific IVCD (unchanged from prior)  Recent Labs: 04/14/2018: ALT 12 04/18/2018: Hemoglobin 9.6; Platelets 163 04/19/2018: BUN 48; Creatinine, Ser 2.70; Potassium 4.3; Sodium 140  No results found for requested labs within last 8760 hours.   CrCl cannot be calculated (Patient's most recent lab result is older than the maximum 21 days allowed.).   Wt Readings from Last 3 Encounters:  10/02/18 230 lb (104.3 kg)  05/17/18 211 lb (95.7 kg)  04/14/18 211 lb (95.7 kg)     Other studies reviewed: Additional studies/records reviewed today include: summarized above  ASSESSMENT AND PLAN:  1. Acute on chronic combined CHF status post ICD: She is significantly volume overloaded with weight being up at least 19 pounds, possibly more as she was noted to have significant lower extremity swelling at her last visit in 05/2018.  Change her Lasix to torsemide 40 mg x 1 followed by 20 mg daily thereafter with KCl repletion.  Check BMP today and again in 1 week in follow-up.  Schedule echocardiogram.  Possibly exacerbated by  underlying A. fib.  We will need to restore normal rhythm following adequate diuresis.  2. Persistent A. Fib: She remains in A. fib with bradycardic ventricular response.  Her most recent INR was noted to be subtherapeutic at 1.8 on 09/25/2018.  In this setting, following outpatient diuresis she will require a TEE guided cardioversion in an effort to restore normal rhythm.  She has been unable to be placed on antiarrhythmic therapy secondary to underlying bradycardic heart rates as well as ischemic heart disease.  Continue Coumadin per Coumadin clinic as well as Coreg for rate control.  3. CAD involving the native coronary arteries status post CABG without angina: No symptoms concerning for angina.  On Coumadin in place of aspirin.  Remains on Lipitor, Imdur, and Coreg.  4. CKD stage IV: Check BMP today and again in 1 week in follow-up.  Monitor with diuresis.  5. PAD: Status post bilateral BKA.  6. Hypertension: Blood pressure is reasonably controlled today.  Disposition: F/u with Dr. Rockey Situ or an APP in 1 week.  Current medicines are reviewed at length with the patient today.  The patient did not have any concerns regarding medicines.  Signed, Christell Faith, PA-C 10/02/2018 4:21 PM     [place],  [phone]

## 2018-10-02 ENCOUNTER — Encounter: Payer: Self-pay | Admitting: Physician Assistant

## 2018-10-02 ENCOUNTER — Ambulatory Visit: Payer: Medicare HMO | Admitting: Physician Assistant

## 2018-10-02 VITALS — BP 130/60 | HR 54 | Ht 68.0 in | Wt 230.0 lb

## 2018-10-02 DIAGNOSIS — I10 Essential (primary) hypertension: Secondary | ICD-10-CM | POA: Diagnosis not present

## 2018-10-02 DIAGNOSIS — I5043 Acute on chronic combined systolic (congestive) and diastolic (congestive) heart failure: Secondary | ICD-10-CM

## 2018-10-02 DIAGNOSIS — I739 Peripheral vascular disease, unspecified: Secondary | ICD-10-CM

## 2018-10-02 DIAGNOSIS — I4819 Other persistent atrial fibrillation: Secondary | ICD-10-CM | POA: Diagnosis not present

## 2018-10-02 DIAGNOSIS — I2510 Atherosclerotic heart disease of native coronary artery without angina pectoris: Secondary | ICD-10-CM

## 2018-10-02 DIAGNOSIS — Z95810 Presence of automatic (implantable) cardiac defibrillator: Secondary | ICD-10-CM

## 2018-10-02 DIAGNOSIS — N184 Chronic kidney disease, stage 4 (severe): Secondary | ICD-10-CM | POA: Diagnosis not present

## 2018-10-02 MED ORDER — TORSEMIDE 20 MG PO TABS: 20.0000 mg | ORAL_TABLET | Freq: Every day | ORAL | 3 refills | Status: DC

## 2018-10-02 NOTE — Patient Instructions (Signed)
Medication Instructions:  Stop the furosemide START Torsemide 40 mg for one day only then take torsemide 20 mg daily.  If you need a refill on your cardiac medications before your next appointment, please call your pharmacy.   Lab work: Your provider would like for you to have the following labs today: BMET  If you have labs (blood work) drawn today and your tests are completely normal, you will receive your results only by: Marland Kitchen MyChart Message (if you have MyChart) OR . A paper copy in the mail If you have any lab test that is abnormal or we need to change your treatment, we will call you to review the results.  Testing/Procedures: None ordered  Follow-Up: At [facility], you and your health needs are our priority.  As part of our continuing mission to provide you with exceptional heart care, we have created designated Provider Care Teams.  These Care Teams include your primary Cardiologist (physician) and Advanced Practice Providers (APPs -  Physician Assistants and Nurse Practitioners) who all work together to provide you with the care you need, when you need it. You will need a follow up appointment in 1 week and for a repeat BMET.  You may see Dr. Rockey Situ or one of the following Advanced Practice Providers on your designated Care Team:   Christell Faith, [place]

## 2018-10-03 ENCOUNTER — Telehealth: Payer: Self-pay | Admitting: *Deleted

## 2018-10-03 LAB — BASIC METABOLIC PANEL
BUN/Creatinine Ratio: 14 (ref 12–28)
BUN: 42 mg/dL — AB (ref 8–27)
CALCIUM: 9.1 mg/dL (ref 8.7–10.3)
CO2: 17 mmol/L — AB (ref 20–29)
CREATININE: 2.96 mg/dL — AB (ref 0.57–1.00)
Chloride: 105 mmol/L (ref 96–106)
GFR calc Af Amer: 18 mL/min/{1.73_m2} — ABNORMAL LOW (ref >59)
GFR, EST NON AFRICAN AMERICAN: 15 mL/min/{1.73_m2} — AB (ref >59)
Glucose: 91 mg/dL (ref 65–99)
POTASSIUM: 4.9 mmol/L (ref 3.5–5.2)
Sodium: 143 mmol/L (ref 134–144)

## 2018-10-03 NOTE — Telephone Encounter (Signed)
Results called to pt. Pt verbalized understanding.  

## 2018-10-03 NOTE — Telephone Encounter (Signed)
-----   Message from Rise Mu, PA-C sent at 10/03/2018  7:19 AM EST ----- Random glucose is ok.  Renal function is slightly up from last check though around her approximate baseline.  Potassium at goal. Hopefully with the transition from Lasix to torsemide we can improve her abdominal congestion and with diuresis her renal function will improve. Continue with torsemide and follow up in 1 week.

## 2018-10-07 ENCOUNTER — Ambulatory Visit (INDEPENDENT_AMBULATORY_CARE_PROVIDER_SITE_OTHER): Payer: Medicare HMO | Admitting: Nurse Practitioner

## 2018-10-07 ENCOUNTER — Ambulatory Visit (INDEPENDENT_AMBULATORY_CARE_PROVIDER_SITE_OTHER): Payer: Medicare HMO

## 2018-10-07 ENCOUNTER — Encounter: Payer: Self-pay | Admitting: Nurse Practitioner

## 2018-10-07 VITALS — BP 144/70 | HR 60 | Ht 68.0 in | Wt 227.8 lb

## 2018-10-07 DIAGNOSIS — I4819 Other persistent atrial fibrillation: Secondary | ICD-10-CM

## 2018-10-07 DIAGNOSIS — I5043 Acute on chronic combined systolic (congestive) and diastolic (congestive) heart failure: Secondary | ICD-10-CM

## 2018-10-07 DIAGNOSIS — I10 Essential (primary) hypertension: Secondary | ICD-10-CM

## 2018-10-07 DIAGNOSIS — N184 Chronic kidney disease, stage 4 (severe): Secondary | ICD-10-CM

## 2018-10-07 DIAGNOSIS — I2510 Atherosclerotic heart disease of native coronary artery without angina pectoris: Secondary | ICD-10-CM | POA: Diagnosis not present

## 2018-10-07 DIAGNOSIS — Z5181 Encounter for therapeutic drug level monitoring: Secondary | ICD-10-CM | POA: Diagnosis not present

## 2018-10-07 DIAGNOSIS — Z7901 Long term (current) use of anticoagulants: Secondary | ICD-10-CM

## 2018-10-07 LAB — POCT INR: INR: 1.9 — AB (ref 2.0–3.0)

## 2018-10-07 NOTE — Patient Instructions (Signed)
Please START NEW DOSAGE of 1.5 tablets every day EXCEPT 2 TABLETS ON MONDAYS & FRIDAYS. Recheck in 2 weeks.

## 2018-10-07 NOTE — Progress Notes (Signed)
Office Visit    Patient Name: Caroline Hernandez Date of Encounter: 10/07/2018  Primary Care Provider:  System, Provider Not In Primary Cardiologist:  Ida Rogue, MD  Chief Complaint    73 y/o ? w/ a h/o CAD, hypertension, hyperlipidemia, ischemic cardiomyopathy, chronic combined systolic and diastolic congestive heart failure, peripheral arterial disease status post bilateral BKA's, stage IV chronic kidney disease, and persistent atrial fibrillation, who presents for follow-up due to recurrent heart failure and A. fib.  Past Medical History    Past Medical History:  Diagnosis Date  . Amputation of left lower extremity below knee ([place])   . Amputation of right lower extremity below knee ([place])   . CAD (coronary artery disease)    a. 2004 Cardiac Arrest/CABG x 3 (LIMA->LAD, VG->OM, VG->RCA);  b. 06/2015 lexiscan MV: no significant ischemia, EF 48%, low risk->Med Rx.  . Chronic combined systolic and diastolic CHF (congestive heart failure) ([place])    a. 12/2014 Echo: EF 45-50%;  b. 08/2015 Echo: EF 45-50%, ant, antsept HK, mildly dil LA, nl RV, mild-mod TR, sev PAH (68mmHg); b. 08/2017 TEE: EF 40-45%, large PFO w/ L->R shunting.  . CKD (chronic kidney disease), stage IV ([place])   . CLL (chronic lymphocytic leukemia) ([place])   . Diabetes mellitus without complication ([place])   . Essential hypertension   . GERD (gastroesophageal reflux disease)   . Hiatal hernia   . History of cardiac arrest    a. 2004.  Marland Kitchen Hyperlipidemia   . Ischemic cardiomyopathy    a. s/p MDT ICD (originally had 3614 lead-->gen change and lead revision ~ 2012 @ [facility]); b. 12/2014 Echo: EF 45-50%;  c.08/2015 Echo: EF 45-50%; d. 08/2017 Echo: EF 40-45%.  Marland Kitchen PAD (peripheral artery disease) ([facility])    a. s/p bilat BKA  . Persistent atrial fibrillation    a. Dx 12/2014.  CHA2DS2VASc = 6--> warfarin;  b. 09/2015 s/p DCCV-->on amio; c. s/p DCCV 04/25/16; d. 07/2016 s/p RFCA/PVI in setting of recurrent afib despite amio; e. 05/2018  Recurrent afib.   Past Surgical History:  Procedure Laterality Date  . ABDOMINAL HYSTERECTOMY    . Amputation lower extremity bilaterally Bilateral   . CARDIAC CATHETERIZATION    . CARDIOVERSION N/A 10/09/2016   Procedure: CARDIOVERSION;  Surgeon: Lelon Perla, MD;  Location: [facility] ENDOSCOPY;  Service: Cardiovascular;  Laterality: N/A;  . CORONARY ARTERY BYPASS GRAFT    . ELECTROPHYSIOLOGIC STUDY N/A 10/07/2015   Procedure: CARDIOVERSION;  Surgeon: Minna Merritts, MD;  Location: [facility] ORS;  Service: Cardiovascular;  Laterality: N/A;  . ELECTROPHYSIOLOGIC STUDY N/A 04/25/2016   Procedure: Cardioversion;  Surgeon: Minna Merritts, MD;  Location: [facility] ORS;  Service: Cardiovascular;  Laterality: N/A;  . ELECTROPHYSIOLOGIC STUDY N/A 07/18/2016   Procedure: Atrial Fibrillation Ablation;  Surgeon: Thompson Grayer, MD;  Location: [facility] LAB;  Service: Cardiovascular;  Laterality: N/A;  . IMPLANTABLE CARDIOVERTER DEFIBRILLATOR IMPLANT  2005   [employer]   . TEE WITHOUT CARDIOVERSION N/A 07/18/2016   Procedure: TRANSESOPHAGEAL ECHOCARDIOGRAM (TEE);  Surgeon: Sueanne Margarita, MD;  Location: [facility] ENDOSCOPY;  Service: Cardiovascular;  Laterality: N/A;  . TEE WITHOUT CARDIOVERSION N/A 10/09/2016   Procedure: TRANSESOPHAGEAL ECHOCARDIOGRAM (TEE);  Surgeon: Lelon Perla, MD;  Location: [facility] ENDOSCOPY;  Service: Cardiovascular;  Laterality: N/A;    Allergies  Allergies  Allergen Reactions  . Piperacillin Other (See Comments)    Renal failure  . 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 See ID  note**PER PT-CAUSED RENAL FAILURE**  . Zosyn [Piperacillin Sod-Tazobactam So] Other (See Comments)    Renal failure    History of Present Illness    73 year old female with the above complex past medical history including coronary artery disease, hypertension, hyperlipidemia, ischemic cardiomyopathy, combined systolic and diastolic heart failure, peripheral arterial  disease status post bilateral BKA's, stage IV chronic kidney disease, and persistent atrial fibrillation.  She is status post cardiac arrest and coronary artery bypass grafting x3 in 2004.  This was complicated by ischemic cardiomyopathy and systolic heart failure and she subsequently underwent ICD placement 2005.  In the spring 2016, she was diagnosed with atrial fibrillation and she was anticoagulated with warfarin.  She was later placed on amiodarone therapy and subsequently underwent cardioversion in January 2017.  She had recurrent atrial fibrillation and required repeat cardioversion in August 2017, but unfortunately developed recurrent atrial fibrillation and was referred to atrial fibrillation clinic in the fall 2017 and subsequently underwent catheter ablation in November 2017.  Historically, she has experienced worsening heart failure symptoms in the setting of atrial fibrillation.  Her last echocardiogram in December 2018 showed an EF of 40 to 45%.    In September 2019, she was noted to be back in rate controlled atrial fibrillation.  She was seen by Dr. Rockey Situ and at that point, decision was made to continue rate control given baseline bradycardia, renal failure, and prior failure of amiodarone.  Patient was not necessarily interested in cardioversion at that time either.  She was seen on January 22 due to progressive lower extremity swelling and approximately 16 pound weight gain despite taking Lasix 40 mg twice daily.  She was switched to torsemide 40 mg x 1 and then 20 mg daily however, patient says she has been taking 20 mg twice daily ever since January 22.  Lab work was evaluated and showed stable renal function that day.  Since that visit, she is down 3 pounds.  Though she has noted improvement in abdominal bloating/swelling, she is continued to have significant bilateral thigh edema.  Activity is fairly limited as she does not walk well on her prostheses but she has noted some dyspnea on  exertion with limited activity.  She denies chest pain, PND, orthopnea, dizziness, syncope, or early satiety.  Home Medications    Prior to Admission medications   Medication Sig Start Date End Date Taking? Authorizing Provider  albuterol (PROVENTIL HFA;VENTOLIN HFA) 108 (90 Base) MCG/ACT inhaler Inhale 2 puffs into the lungs every 6 (six) hours as needed for wheezing or shortness of breath. 03/10/18  Yes Sudini, Alveta Heimlich, MD  amitriptyline (ELAVIL) 25 MG tablet Take 25 mg by mouth at bedtime.    Yes [provider]  amLODipine (NORVASC) 10 MG tablet TAKE 1 TABLET(10 MG) BY MOUTH DAILY 08/19/18  Yes Gollan, Kathlene November, MD  atorvastatin (LIPITOR) 40 MG tablet Take 40 mg by mouth at bedtime.    Yes [provider]  carvedilol (COREG) 3.125 MG tablet TAKE 1 TABLET BY MOUTH TWICE DAILY 06/07/18  Yes Gollan, Kathlene November, MD  Cholecalciferol (HM VITAMIN D3) 4000 UNITS CAPS Take 4,000 Units by mouth daily.   Yes [provider]  gabapentin (NEURONTIN) 100 MG capsule Take 100 mg by mouth 2 (two) times daily.   Yes [provider]  insulin aspart (NOVOLOG) 100 UNIT/ML injection Inject 5-10 Units into the skin 3 (three) times daily before meals. 10 units into the skin before breakfast then 5 units before lunch then 10 units  before supper (evening meal)   Yes [provider]  insulin glargine (LANTUS) 100 UNIT/ML injection Inject 14-38 Units into the skin 2 (two) times daily before a meal. 38 units into the skin before breakfast and 14 units before supper (evening meal)   Yes [provider]  iron polysaccharides (NIFEREX) 150 MG capsule Take 150 mg by mouth daily.   Yes [provider]  isosorbide mononitrate (IMDUR) 30 MG 24 hr tablet TAKE 2 TABLETS(60 MG) BY MOUTH TWICE DAILY 08/19/18  Yes Gollan, Kathlene November, MD  losartan (COZAAR) 100 MG tablet TAKE 1 TABLET BY MOUTH EVERY DAY 05/14/18  Yes [provider]  Multiple Vitamins-Minerals (VITEYES  AREDS FORMULA/LUTEIN) CAPS Take 1 capsule by mouth 2 (two) times daily.   Yes [provider]  nitroGLYCERIN (NITROSTAT) 0.4 MG SL tablet Place 1 tablet (0.4 mg total) under the tongue every 5 (five) minutes as needed for chest pain. 06/14/15  Yes Mody, Ulice Bold, MD  pantoprazole (PROTONIX) 40 MG tablet Take 40 mg by mouth at bedtime. 06/19/16  Yes [provider]  potassium chloride SA (K-DUR,KLOR-CON) 20 MEQ tablet TAKE 1 TABLET BY MOUTH TWICE DAILY 07/11/18  Yes Gollan, Kathlene November, MD  PREVIDENT 5000 DRY MOUTH 1.1 % GEL dental gel Place 1 application onto teeth daily.    Yes [provider]  sertraline (ZOLOFT) 100 MG tablet Take 100 mg by mouth at bedtime.  06/02/15  Yes [provider]  torsemide (DEMADEX) 20 MG tablet Take 1 tablet (20 mg total) by mouth daily. 10/02/18  Yes Dunn, Areta Haber, PA-C  vitamin C (ASCORBIC ACID) 500 MG tablet Take 500 mg by mouth daily.   Yes [provider]  warfarin (COUMADIN) 4 MG tablet TAKE AS DIRECTED BY COUMADIN CLINIC 09/23/18  Yes Gollan, Kathlene November, MD  warfarin (COUMADIN) 4 MG tablet TAKE AS DIRECTED BY COUMADIN CLINIC 10/02/18  Yes Minna Merritts, MD    Review of Systems    Lower extremity edema with dyspnea on exertion as outlined above.  She occasionally notes palpitations.  She denies chest pain, PND, orthopnea, dizziness, syncope, or early satiety.  All other systems reviewed and are otherwise negative except as noted above.  Physical Exam    VS:  BP (!) 144/70 (BP Location: Left Arm, Patient Position: Sitting, Cuff Size: Large)   Pulse 60   Ht 5\' 8"  (1.727 m)   Wt 227 lb 12.8 oz (103.3 kg)   BMI 34.64 kg/m  , BMI Body mass index is 34.64 kg/m. GEN: Well nourished, well developed, in no acute distress. HEENT: normal. Neck: Supple, no JVD, carotid bruits, or masses. Cardiac: Irregularly irregular, 2/6 systolic ejection murmur the right upper sternal border, no rubs, or gallops. No clubbing, cyanosis of the  upper extremities.  2+ bilateral woody edema of the thighs above her prostheses. Respiratory:  Respirations regular and unlabored, clear to auscultation bilaterally. GI: Soft, nontender, nondistended, BS + x 4. MS: no deformity or atrophy. Skin: warm and dry, no rash. Neuro:  Strength and sensation are intact. Psych: Normal affect.  Accessory Clinical Findings    ECG personally reviewed by me today - Afib, 60, LAD, IVCD, prior septal infarct  - no acute changes.  Lab Results  Component Value Date   CREATININE 2.96 (H) 10/02/2018   BUN 42 (H) 10/02/2018   NA 143 10/02/2018   K 4.9 10/02/2018   CL 105 10/02/2018   CO2 17 (L) 10/02/2018     Assessment & Plan  1.  Persistent Atrial Fibrillation:  Pt with a h/o Afib s/p prior cardioversions on amiodarone with eventual return of Afib in 05/2016 and subsequent RFCA/PVI in 07/2016.  She was found to be back in afib in 05/2018 and has been rate-controlled on  blocker therapy since.  Though she did well throughout the fall and early winter, ~ 2 wks ago, she noted increasing lower ext edema/abd girth, and DOE.  She was seen in clinic 1/22 and was up ~ 19 lbs from her dry wt.  Labs showed stable renal fxn/dysfxn.  Lasix was d/c'd in favor of torsemide, which she has been taking - 20mg  BID.  Though she has had reduction in abd girth/swelling, her wt is only down 3 lbs and she cont to have thigh edema.  That she has presumably been in afib since Sept 2019 and has only run into volume issues over the past 2 wks in the absence of change in diet or meds, is concerning.  As previously noted, she may ultimately require repeat DCCV, however, as she has failed DCCV in the past, even with amiodarone on board, I'd like to diurese her further first and collect more data re: EF w/ echo.   BMET today w/ plan to escalate torsemide dosing if renal fxn stable.    She is well-rate-controlled on  blocker therapy.  I will arrange for f/u with Dr. Caryl Comes to consider  alternatives to rate-control (? Referral back to AFib clinic for touch up PVI), as antiarrhythmic options are limited by her h/o CAD/CHF/CKD IV.  2.  Acute on chronic combined systolic and diastolic CHF/ICM:  As above, volume minimally better on low-dose torsemide - currently taking 20 BID. F/u bmet today and will plan to escalate to 40 BID if renal fxn stable w/ office f/u in 1-2 wks.  It's unclear to what extent afib is playing a role in Ss, but it is likely playing a role. F/u echo as above.  She is s/p single lead AICD.  QRS is wide @ 126, thus benefit of CRT less clear, though Dr. Rockey Situ previously concerned about resumption of antiarrhythmic therapy 2/2 bradycardia w/ 1st degree AVB when in sinus.  As above, will refer back to Dr. Caryl Comes.  Cont  blocker and ARB.  3.  CKD IV:  Renal fxn stable last week.  F/u today.  If creat worse, will need to refer back to nephrology - she sees Dr. Candiss Norse locally.  4.  CAD:  S/p prior CABG.  No chest pain.  F/u echo in setting of worsening volume.  If EF down significantly, would benefit from ischemic eval, though renal fxn prohibitive to cath.  Cont  blocker, nitrate, ARB, statin. No ASA in setting of chronic coumadin.  5.  DMII: A1c 9.1 in June 2019. On insulin per IM.  6.  HL:  No LDL in several years. Nl LFTs in 07/2018.  Cont statin therapy.    7.  PAD: cont statin.  8.  Essential HTN:  BP elevated today.  F/u labs with plan to escalate diuretics if appropriate.  9.  Dispo:  F/u bmet today.  F/u in clinic in 2 wks.  F/u with Dr. Caryl Comes.  Murray Hodgkins, NP 10/07/2018, 3:05 PM

## 2018-10-07 NOTE — Patient Instructions (Addendum)
Medication Instructions:  No changes  If you need a refill on your cardiac medications before your next appointment, please call your pharmacy.   Lab work: Your provider would like for you to have the following labs today: BMET  If you have labs (blood work) drawn today and your tests are completely normal, you will receive your results only by: Marland Kitchen MyChart Message (if you have MyChart) OR . A paper copy in the mail If you have any lab test that is abnormal or we need to change your treatment, we will call you to review the results.  Testing/Procedures: None ordered  Follow-Up: At [facility], you and your health needs are our priority.  As part of our continuing mission to provide you with exceptional heart care, we have created designated Provider Care Teams.  These Care Teams include your primary Cardiologist (physician) and Advanced Practice Providers (APPs -  Physician Assistants and Nurse Practitioners) who all work together to provide you with the care you need, when you need it. You will need a follow up appointment in 2 weeks. You may see Christell Faith, PA or one of the following Advanced Practice Providers on your designated Care Team.

## 2018-10-08 ENCOUNTER — Telehealth: Payer: Self-pay | Admitting: *Deleted

## 2018-10-08 DIAGNOSIS — I4819 Other persistent atrial fibrillation: Secondary | ICD-10-CM

## 2018-10-08 LAB — BASIC METABOLIC PANEL
BUN / CREAT RATIO: 13 (ref 12–28)
BUN: 39 mg/dL — ABNORMAL HIGH (ref 8–27)
CHLORIDE: 103 mmol/L (ref 96–106)
CO2: 22 mmol/L (ref 20–29)
Calcium: 9.4 mg/dL (ref 8.7–10.3)
Creatinine, Ser: 2.93 mg/dL — ABNORMAL HIGH (ref 0.57–1.00)
GFR calc Af Amer: 18 mL/min/{1.73_m2} — ABNORMAL LOW (ref >59)
GFR calc non Af Amer: 16 mL/min/{1.73_m2} — ABNORMAL LOW (ref >59)
GLUCOSE: 98 mg/dL (ref 65–99)
POTASSIUM: 5.2 mmol/L (ref 3.5–5.2)
SODIUM: 142 mmol/L (ref 134–144)

## 2018-10-08 NOTE — Telephone Encounter (Signed)
Reviewed results and recommendations with patient and she verbalized understanding with no further questions at this time. Repeat labs entered and medications updated at this time. She wrote all information down and read back to me to verify. She was appreciative for the call and had no further questions at this time.

## 2018-10-08 NOTE — Telephone Encounter (Signed)
-----   Message from Theora Gianotti, NP sent at 10/08/2018  7:51 AM EST ----- Renal fxn stable.  Please have her increase torsemide to 40 BID.  F/u bmet next Monday.

## 2018-10-08 NOTE — Telephone Encounter (Signed)
-----   Message from Theora Gianotti, NP sent at 10/08/2018  7:52 AM EST ----- K is mildly elevated - she may stop potassium supplementation.

## 2018-10-11 ENCOUNTER — Ambulatory Visit (INDEPENDENT_AMBULATORY_CARE_PROVIDER_SITE_OTHER): Payer: Medicare HMO

## 2018-10-11 DIAGNOSIS — I5043 Acute on chronic combined systolic (congestive) and diastolic (congestive) heart failure: Secondary | ICD-10-CM

## 2018-10-14 ENCOUNTER — Telehealth: Payer: Self-pay

## 2018-10-14 NOTE — Telephone Encounter (Signed)
-----   Message from Theora Gianotti, NP sent at 10/12/2018  8:00 AM EST ----- Heart squeezing fxn 35-40%, which is slightly less than previously recorded.  Mildly leaky mitral valve and mildly to moderately leaky tricuspid valve. Keep f/u as planned.  We would normally consider a heart cath given abrupt onset of symptoms and further reduction of heart squeezing fxn, but much less likely in setting of her kidney disease.

## 2018-10-14 NOTE — Telephone Encounter (Signed)
Called pt to discuss results from Echo. Pt verbalized understanding and has f/u with Dr. Caryl Comes tomorrow morning. Pt reports that she will be at appt tomorrow and has no questions at this time.   Advised pt to call for any further questions or concerns.

## 2018-10-15 ENCOUNTER — Ambulatory Visit (INDEPENDENT_AMBULATORY_CARE_PROVIDER_SITE_OTHER): Payer: Medicare HMO | Admitting: Internal Medicine

## 2018-10-15 ENCOUNTER — Encounter: Payer: Self-pay | Admitting: Internal Medicine

## 2018-10-15 VITALS — BP 142/78 | HR 81 | Ht 68.0 in | Wt 238.0 lb

## 2018-10-15 DIAGNOSIS — I4819 Other persistent atrial fibrillation: Secondary | ICD-10-CM

## 2018-10-15 DIAGNOSIS — I255 Ischemic cardiomyopathy: Secondary | ICD-10-CM

## 2018-10-15 DIAGNOSIS — Z79899 Other long term (current) drug therapy: Secondary | ICD-10-CM | POA: Diagnosis not present

## 2018-10-15 DIAGNOSIS — G4730 Sleep apnea, unspecified: Secondary | ICD-10-CM

## 2018-10-15 DIAGNOSIS — Z95810 Presence of automatic (implantable) cardiac defibrillator: Secondary | ICD-10-CM

## 2018-10-15 MED ORDER — CARVEDILOL 3.125 MG PO TABS: ORAL_TABLET | ORAL | 5 refills | Status: DC

## 2018-10-15 MED ORDER — AMIODARONE HCL 400 MG PO TABS: ORAL_TABLET | ORAL | 0 refills | Status: DC

## 2018-10-15 MED ORDER — LOSARTAN POTASSIUM 50 MG PO TABS: 50.0000 mg | ORAL_TABLET | Freq: Every day | ORAL | 3 refills | Status: DC

## 2018-10-15 NOTE — Progress Notes (Signed)
Patient Care Team: System, Provider Not In as PCP - General Gollan, Kathlene November, MD as PCP - Cardiology (Cardiology) Minna Merritts, MD as Consulting Physician (Cardiology)      HPI Caroline Hernandez is a 73 y.o. female seen in follow-up for ICD implanted for primary prevention in the setting of ischemic heart disease with prior bypass surgery In 2004.    She has a history of a prior ICD for cardiac arrest;  this occurred prior to her bypass. She then had a 6949-lead implanted in the end up with lead failure treated with insertion of a new lead and the abandoning of the old lead. This was done 2012. She has had no interval ICD discharges.  She has a history of atrial fibrillation initially diagnosed 2016. She underwent cardioversion 1/17. 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  She underwent catheter ablation by Dr. Greggory Brandy.     She has chronic kidney disease and her creatinine was 2.93 on 10/07/2018.   Date Cr K TSH LFT WBC Hgb  6/19     52.8 11.8  8/19   22  37.6 9.6  1/20 2.93 5.2                   DATE TEST EF   12/15 Echo   45-50 %   1/20 /Echo   35-40 % LAE mild            She remains on Coumadin  w recent subtherapeutic INR.  Reviewing her chart she underwent ablation for atrial fibrillation 2017 and felt considerably better until the recurrence of atrial fibrillation fall 2019.  Seen 2 weeks ago with progressive lower extremity edema dyspnea nocturnal dyspnea.  She has significant dyspnea on exertion making effort ever more challenging.  She is able to transfer.  Snores with daytime somnolence   Thromboembolic risk factors ( age -73, HTN-1, DM-1, Vasc disease -1, CHF-1, Gender-1) for a CHADSVASc Score of 6     Past Medical History:  Diagnosis Date  . Amputation of left lower extremity below knee ([place])   . Amputation of right lower extremity below knee ([place])   . CAD (coronary artery disease)    a. 2004 Cardiac Arrest/CABG x 3 (LIMA->LAD, VG->OM,  VG->RCA);  b. 06/2015 lexiscan MV: no significant ischemia, EF 48%, low risk->Med Rx.  . Chronic combined systolic and diastolic CHF (congestive heart failure) ([place])    a. 12/2014 Echo: EF 45-50%;  b. 08/2015 Echo: EF 45-50%, ant, antsept HK, mildly dil LA, nl RV, mild-mod TR, sev PAH (38mmHg); b. 08/2017 TEE: EF 40-45%, large PFO w/ L->R shunting.  . CKD (chronic kidney disease), stage IV ([place])   . CLL (chronic lymphocytic leukemia) ([place])   . Diabetes mellitus without complication ([place])   . Essential hypertension   . GERD (gastroesophageal reflux disease)   . Hiatal hernia   . History of cardiac arrest    a. 2004.  Marland Kitchen Hyperlipidemia   . Ischemic cardiomyopathy    a. s/p MDT ICD (originally had 2458 lead-->gen change and lead revision ~ 2012 @ [place]); b. 12/2014 Echo: EF 45-50%;  c.08/2015 Echo: EF 45-50%; d. 08/2017 Echo: EF 40-45%.  Marland Kitchen PAD (peripheral artery disease) (HCC)    a. s/p bilat BKA  . Persistent atrial fibrillation    a. Dx 12/2014.  CHA2DS2VASc = 6--> warfarin;  b. 09/2015 s/p DCCV-->on amio; c. s/p DCCV 04/25/16; d. 07/2016 s/p RFCA/PVI in setting of recurrent afib  despite amio; e. 05/2018 Recurrent afib.    Past Surgical History:  Procedure Laterality Date  . ABDOMINAL HYSTERECTOMY    . Amputation lower extremity bilaterally Bilateral   . CARDIAC CATHETERIZATION    . CARDIOVERSION N/A 10/09/2016   Procedure: CARDIOVERSION;  Surgeon: Lelon Perla, MD;  Location: [facility] ENDOSCOPY;  Service: Cardiovascular;  Laterality: N/A;  . CORONARY ARTERY BYPASS GRAFT    . ELECTROPHYSIOLOGIC STUDY N/A 10/07/2015   Procedure: CARDIOVERSION;  Surgeon: Minna Merritts, MD;  Location: [facility] ORS;  Service: Cardiovascular;  Laterality: N/A;  . ELECTROPHYSIOLOGIC STUDY N/A 04/25/2016   Procedure: Cardioversion;  Surgeon: Minna Merritts, MD;  Location: [facility] ORS;  Service: Cardiovascular;  Laterality: N/A;  . ELECTROPHYSIOLOGIC STUDY N/A 07/18/2016   Procedure: Atrial Fibrillation Ablation;  Surgeon:  Thompson Grayer, MD;  Location: [place] CV LAB;  Service: Cardiovascular;  Laterality: N/A;  . IMPLANTABLE CARDIOVERTER DEFIBRILLATOR IMPLANT  2005   [employer]   . TEE WITHOUT CARDIOVERSION N/A 07/18/2016   Procedure: TRANSESOPHAGEAL ECHOCARDIOGRAM (TEE);  Surgeon: Sueanne Margarita, MD;  Location: [facility] ENDOSCOPY;  Service: Cardiovascular;  Laterality: N/A;  . TEE WITHOUT CARDIOVERSION N/A 10/09/2016   Procedure: TRANSESOPHAGEAL ECHOCARDIOGRAM (TEE);  Surgeon: Lelon Perla, MD;  Location: [facility] ENDOSCOPY;  Service: Cardiovascular;  Laterality: N/A;    Current Outpatient Medications  Medication Sig Dispense Refill  . albuterol (PROVENTIL HFA;VENTOLIN HFA) 108 (90 Base) MCG/ACT inhaler Inhale 2 puffs into the lungs every 6 (six) hours as needed for wheezing or shortness of breath. 1 Inhaler 0  . amitriptyline (ELAVIL) 25 MG tablet Take 25 mg by mouth at bedtime.   11  . amLODipine (NORVASC) 10 MG tablet TAKE 1 TABLET(10 MG) BY MOUTH DAILY 90 tablet 0  . atorvastatin (LIPITOR) 40 MG tablet Take 40 mg by mouth at bedtime.   11  . carvedilol (COREG) 3.125 MG tablet TAKE 1 TABLET BY MOUTH TWICE DAILY 60 tablet 5  . Cholecalciferol (HM VITAMIN D3) 4000 UNITS CAPS Take 4,000 Units by mouth daily.    Marland Kitchen gabapentin (NEURONTIN) 100 MG capsule Take 100 mg by mouth 2 (two) times daily.    . insulin aspart (NOVOLOG) 100 UNIT/ML injection Inject 5-10 Units into the skin 3 (three) times daily before meals. 10 units into the skin before breakfast then 5 units before lunch then 10 units before supper (evening meal)    . insulin glargine (LANTUS) 100 UNIT/ML injection Inject 14-38 Units into the skin 2 (two) times daily before a meal. 38 units into the skin before breakfast and 14 units before supper (evening meal)    . iron polysaccharides (NIFEREX) 150 MG capsule Take 150 mg by mouth daily.    . isosorbide mononitrate (IMDUR) 30 MG 24 hr tablet TAKE 2 TABLETS(60 MG) BY MOUTH TWICE DAILY 180 tablet 0  . losartan  (COZAAR) 100 MG tablet TAKE 1 TABLET BY MOUTH EVERY DAY    . Multiple Vitamins-Minerals (VITEYES AREDS FORMULA/LUTEIN) CAPS Take 1 capsule by mouth 2 (two) times daily.    . nitroGLYCERIN (NITROSTAT) 0.4 MG SL tablet Place 1 tablet (0.4 mg total) under the tongue every 5 (five) minutes as needed for chest pain. 30 tablet 0  . pantoprazole (PROTONIX) 40 MG tablet Take 40 mg by mouth at bedtime.    Marland Kitchen PREVIDENT 5000 DRY MOUTH 1.1 % GEL dental gel Place 1 application onto teeth daily.   5  . sertraline (ZOLOFT) 100 MG tablet Take 100 mg by mouth at bedtime.   11  .  torsemide (DEMADEX) 20 MG tablet Take 2 tablets (40 mg total) by mouth 2 (two) times daily. 30 tablet 3  . vitamin C (ASCORBIC ACID) 500 MG tablet Take 500 mg by mouth daily.    Marland Kitchen warfarin (COUMADIN) 4 MG tablet TAKE AS DIRECTED BY COUMADIN CLINIC 50 tablet 0  . warfarin (COUMADIN) 4 MG tablet TAKE AS DIRECTED BY COUMADIN CLINIC 50 tablet 0   No current facility-administered medications for this visit.     Allergies  Allergen Reactions  . Piperacillin Other (See Comments)    Renal failure  . 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 See ID note**PER PT-CAUSED RENAL FAILURE**  . Zosyn [Piperacillin Sod-Tazobactam So] Other (See Comments)    Renal failure      Review of Systems negative except from HPI and PMH  Physical Exam BP (!) 142/78 (BP Location: Left Arm, Patient Position: Sitting, Cuff Size: Normal)   Pulse 81   Ht 5\' 8"  (1.727 m)   Wt 238 lb (108 kg)   BMI 36.19 kg/m  Well developed and well nourished in no acute distress sitting in wheel chair  HENT normal E scleral and icterus clear Neck Supple JVP <10  Clear to ausculation Regular rate and rhythm, 2/6 mb Soft   No clubbing cyanosis s/p B BKA Alert and oriented, grossly normal motor and sensory function Skin Warm and Dry  ECG Personally reviewed    atrial fibrillation and 81 Intervals-/13/41 IVCD Left axis  deviation  Assessment and  Plan  Atrial Fibrillation-persistent  ICD  The patient's device was interrogated.  The information was reviewed. No changes were made in the programming.     Ischemic cardiomyopathy  Aborted Cardiac Arrest   Hyperkalemia  CLL  Anemia  Chronic renal insufficiency grade 5  Sleep disorder breathing   Congestive heart failure acute/chronic/systolic/diastolic  She is less volume overloaded since the introduction of torsemide.  We will continue gentle diuresis.  Check metabolic profile today as well as potassium  Atrial fibrillation seems to likely be a contributor to her congestive heart failure.  Will reintroduce amiodarone, her substrate having been modified by ablation.  Anticipate cardioversion in about 3 weeks.  Have reviewed side effects as well as down titrated her Coumadin because of drug interactions.  Would consider repeat PVI.  Her hematological issues are concerning.  Her anemia seems to have been rather acute from 6-8/19.  Will recheck CBC and Furth count today.  We will check amiodarone surveillance laboratories for baseline.  With sleep disordered breathing atrial fibrillation, needs a sleep study.  Will arrange.  More than 50% of 40  min was spent in counseling related to the above  Current medicines are reviewed at length with the patient today .  The patient does not  have concerns regarding medicines.  CLL is followed at [facility], Margit Banda am acting as a scribe for Virl Axe, M.D.   I have reviewed the above documentation for accuracy and completeness, and I agree with the above.  Virl Axe

## 2018-10-15 NOTE — Patient Instructions (Addendum)
Medication Instructions:  - Your physician has recommended you make the following change in your medication:   1) DECREASE coreg (carvedilol) 3.125 mg- take 1 tablet by mouth once daily  2) DECREASE cozaar (losartan) to 50 mg- take 1 tablet by mouth once daily  3) DECREASE coumadin (warfarin) 4 mg- take 1.5 tablets (6 mg) by mouth once daily  4) START amiodarone:  - 400 mg- take 1 tablet by mouth TWICE daily x 2 weeks, then - 400 mg- take 1 tablet by mouth ONCE daily x 2 weeks, then  - 200 mg- take 1 tablet by mouth ONCE daily    If you need a refill on your cardiac medications before your next appointment, please call your pharmacy.   Lab work: - Your physician recommends that you have lab work today: BMP/ CBC/ Liver/ TSH [employer]- 1st desk on the right to check in).  If you have labs (blood work) drawn today and your tests are completely normal, you will receive your results only by: Marland Kitchen MyChart Message (if you have MyChart) OR . A paper copy in the mail If you have any lab test that is abnormal or we need to change your treatment, we will call you to review the results.  Testing/Procedures: - Your physician has recommended that you have a Cardioversion (DCCV) in 3 weeks. Electrical Cardioversion uses a jolt of electricity to your heart either through paddles or wired patches attached to your chest. This is a controlled, usually prescheduled, procedure. Defibrillation is done under light anesthesia in the hospital, and you usually go home the day of the procedure. This is done to get your heart back into a normal rhythm. You are not awake for the procedure.   You are scheduled for a Cardioversion on Wednesday 11/06/2018 with Dr. Rockey Situ.  Please arrive at the [facility] at 6:30 a.m. on the day of your procedure. 1st desk on the right to check in.   DIET INSTRUCTIONS:  Nothing to eat or drink after midnight the night before your procedure  1) Labs:  today  2) Medications:  You may take all of your regular medications with enough water to get them down safely the morning of your procedure unless listed below:  - HOLD demadex (torsemide) the morning of your procedure - HOLD all insulin the morning of your procedure, if you take any night time insulin, then please take a 1/2 of your usual dose the night prior  3) Must have a responsible person to drive you home.  4) Bring a current list of your medications and current insurance cards.    If you have any questions after you get home, please call the office at [phone]  - Your physician has recommended that you have a pulmonary function test. Pulmonary Function Tests are a group of tests that measure how well air moves in and out of your lungs.- see attached instruction sheet  - You have been referred to : [facility] - evaluation of sleep apnea.  Follow-Up: At [facility], you and your health needs are our priority.  As part of our continuing mission to provide you with exceptional heart care, we have created designated Provider Care Teams.  These Care Teams include your primary Cardiologist (physician) and Advanced Practice Providers (APPs -  Physician Assistants and Nurse Practitioners) who all work together to provide you with the care you need, when you need it.  . In 5-6 weeks with Dr. Caryl Comes  .  In 1 week with [facility] in the Coumadin Clinic  Any Other Special Instructions Will Be Listed Below (If Applicable). - N/A   Pulmonary Function Tests Pulmonary function tests (PFTs) are used to measure how well your lungs work, find out what is causing your lung problems, and figure out the best treatment for you. You may have PFTs:  When you have an illness involving the lungs.  To follow changes in your lung function over time if you have a chronic lung disease.  If you are an Nature conservation officer. This checks the effects of being exposed to chemicals over a long period of  time.  To check lung function before having surgery or other procedures.  To check your lungs if you smoke.  To check if prescribed medicines or treatments are helping your lungs. Your results will be compared to the expected lung function of someone with healthy lungs who is similar to you in:  Age.  Gender.  Height.  Weight.  Race or ethnicity. This is done to show how your lungs compare to normal lung function (percent predicted). This is how your health care provider knows if your lung function is normal or not. If you have had PFTs done before, your health care provider will compare your current results with past results. This shows if your lung function is better, worse, or the same as before. Tell a health care provider about:  Any allergies you have.  All medicines you are taking, including inhaler or nebulizer medicines, vitamins, herbs, eye drops, creams, and over-the-counter medicines.  Any blood disorders you have.  Any surgeries you have had, especially recent eye surgery, abdominal surgery, or chest surgery. These can make PFTs difficult or unsafe.  Any medical conditions you have, including chest pain or heart problems, tuberculosis, or respiratory infections such as pneumonia, a cold, or the flu.  Any fear of being in closed spaces (claustrophobia). Some of your tests may be in a closed space. What are the risks? Generally, this is a safe procedure. However, problems may occur, including:  Light-headedness due to over-breathing (hyperventilation).  An asthma attack from deep breathing.  A collapsed lung. What happens before the procedure?  Take over-the-counter and prescription medicines only as told by your health care provider. If you take inhaler or nebulizer medicines, ask your health care provider which medicines you should take on the day of your testing. Some inhaler medicines may interfere with PFTs if they are taken shortly before the  tests.  Follow your health care provider's instructions on eating and drinking restrictions. This may include avoiding eating large meals and drinking alcohol before the testing.  Do not use any products that contain nicotine or tobacco, such as cigarettes and e-cigarettes. If you need help quitting, ask your health care provider.  Wear comfortable clothing that will not interfere with breathing. What happens during the procedure?   You will be given a soft nose clip to wear. This is done so all of your breaths will go through your mouth instead of your nose.  You will be given a germ-free (sterile) mouthpiece. It will be attached to a machine that measures your breathing (spirometer).  You will be asked to do various breathing maneuvers. The maneuvers will be done by breathing in (inhaling) and breathing out (exhaling). You may be asked to repeat the maneuvers several times before the testing is done.  It is important to follow the instructions exactly to get accurate results. Make sure to blow  as hard and as fast as you can when you are told to do so.  You may be given a medicine that makes the small air passages in your lungs larger (bronchodilator) after testing has been done. This medicine will make it easier for you to breathe.  The tests will be repeated after the bronchodilator has taken effect.  You will be monitored carefully during the procedure for faintness, dizziness, trouble breathing, or any other problems. The procedure may vary among health care providers and hospitals. What happens after the procedure?  It is up to you to get your test results. Ask your health care provider, or the department that is doing the tests, when your results will be ready. After you have received your test results, talk with your health care provider about treatment options, if necessary. Summary  Pulmonary function tests (PFTs) are used to measure how well your lungs work, find out what is  causing your lung problems, and figure out the best treatment for you.  Wear comfortable clothing that will not interfere with breathing.  It is up to you to get your test results. After you have received them, talk with your health care provider about treatment options, if necessary. This information is not intended to replace advice given to you by your health care provider. Make sure you discuss any questions you have with your health care provider. Document Released: 04/20/2004 Document Revised: 07/20/2016 Document Reviewed: 07/20/2016 Elsevier Interactive Patient Education  2019 [employer].    Curator cardioversion is the delivery of a jolt of electricity to restore a normal rhythm to the heart. A rhythm that is too fast or is not regular keeps the heart from pumping well. In this procedure, sticky patches or metal paddles are placed on the chest to deliver electricity to the heart from a device. This procedure may be done in an emergency if:  There is low or no blood pressure as a result of the heart rhythm.  Normal rhythm must be restored as fast as possible to protect the brain and heart from further damage.  It may save a life. This procedure may also be done for irregular or fast heart rhythms that are not immediately life-threatening. Tell a health care provider about:  Any allergies you have.  All medicines you are taking, including vitamins, herbs, eye drops, creams, and over-the-counter medicines.  Any problems you or family members have had with anesthetic medicines.  Any blood disorders you have.  Any surgeries you have had.  Any medical conditions you have.  Whether you are pregnant or may be pregnant. What are the risks? Generally, this is a safe procedure. However, problems may occur, including:  Allergic reactions to medicines.  A blood clot that breaks free and travels to other parts of your body.  The possible return of  an abnormal heart rhythm within hours or days after the procedure.  Your heart stopping (cardiac arrest). This is rare. What happens before the procedure? Medicines  Your health care provider may have you start taking: ? Blood-thinning medicines (anticoagulants) so your blood does not clot as easily. ? Medicines may be given to help stabilize your heart rate and rhythm.  Ask your health care provider about changing or stopping your regular medicines. This is especially important if you are taking diabetes medicines or blood thinners. General instructions  Plan to have someone take you home from the hospital or clinic.  If you will be going home right  after the procedure, plan to have someone with you for 24 hours.  Follow instructions from your health care provider about eating or drinking restrictions. What happens during the procedure?  To lower your risk of infection: ? Your health care team will wash or sanitize their hands. ? Your skin will be washed with soap.  An IV tube will be inserted into one of your veins.  You will be given a medicine to help you relax (sedative).  Sticky patches (electrodes) or metal paddles may be placed on your chest.  An electrical shock will be delivered. The procedure may vary among health care providers and hospitals. What happens after the procedure?   Your blood pressure, heart rate, breathing rate, and blood oxygen level will be monitored until the medicines you were given have worn off.  Do not drive for 24 hours if you were given a sedative.  Your heart rhythm will be watched to make sure it does not change. This information is not intended to replace advice given to you by your health care provider. Make sure you discuss any questions you have with your health care provider. Document Released: 08/18/2002 Document Revised: 04/26/2016 Document Reviewed: 03/03/2016 Elsevier Interactive Patient Education  2019 [employer].

## 2018-10-16 ENCOUNTER — Other Ambulatory Visit: Payer: Self-pay

## 2018-10-16 ENCOUNTER — Emergency Department: Payer: Medicare HMO

## 2018-10-16 ENCOUNTER — Inpatient Hospital Stay
Admission: EM | Admit: 2018-10-16 | Discharge: 2018-10-31 | DRG: 981 | Disposition: A | Discharge status: Home / Self Care | Payer: Medicare HMO | Attending: Internal Medicine | Admitting: Internal Medicine

## 2018-10-16 DIAGNOSIS — N185 Chronic kidney disease, stage 5: Secondary | ICD-10-CM | POA: Diagnosis not present

## 2018-10-16 DIAGNOSIS — I700 Atherosclerosis of aorta: Secondary | ICD-10-CM | POA: Diagnosis present

## 2018-10-16 DIAGNOSIS — Z66 Do not resuscitate: Secondary | ICD-10-CM | POA: Diagnosis present

## 2018-10-16 DIAGNOSIS — I739 Peripheral vascular disease, unspecified: Secondary | ICD-10-CM | POA: Diagnosis not present

## 2018-10-16 DIAGNOSIS — J9601 Acute respiratory failure with hypoxia: Secondary | ICD-10-CM | POA: Diagnosis present

## 2018-10-16 DIAGNOSIS — I255 Ischemic cardiomyopathy: Secondary | ICD-10-CM | POA: Diagnosis present

## 2018-10-16 DIAGNOSIS — E1122 Type 2 diabetes mellitus with diabetic chronic kidney disease: Secondary | ICD-10-CM | POA: Diagnosis present

## 2018-10-16 DIAGNOSIS — Z8249 Family history of ischemic heart disease and other diseases of the circulatory system: Secondary | ICD-10-CM

## 2018-10-16 DIAGNOSIS — N184 Chronic kidney disease, stage 4 (severe): Secondary | ICD-10-CM | POA: Diagnosis not present

## 2018-10-16 DIAGNOSIS — Z6832 Body mass index (BMI) 32.0-32.9, adult: Secondary | ICD-10-CM

## 2018-10-16 DIAGNOSIS — I5023 Acute on chronic systolic (congestive) heart failure: Secondary | ICD-10-CM

## 2018-10-16 DIAGNOSIS — I4819 Other persistent atrial fibrillation: Secondary | ICD-10-CM | POA: Diagnosis present

## 2018-10-16 DIAGNOSIS — Z79899 Other long term (current) drug therapy: Secondary | ICD-10-CM

## 2018-10-16 DIAGNOSIS — I4891 Unspecified atrial fibrillation: Secondary | ICD-10-CM

## 2018-10-16 DIAGNOSIS — D72829 Elevated white blood cell count, unspecified: Secondary | ICD-10-CM

## 2018-10-16 DIAGNOSIS — R05 Cough: Secondary | ICD-10-CM | POA: Diagnosis not present

## 2018-10-16 DIAGNOSIS — I509 Heart failure, unspecified: Secondary | ICD-10-CM | POA: Diagnosis present

## 2018-10-16 DIAGNOSIS — R34 Anuria and oliguria: Secondary | ICD-10-CM | POA: Diagnosis present

## 2018-10-16 DIAGNOSIS — N171 Acute kidney failure with acute cortical necrosis: Secondary | ICD-10-CM | POA: Diagnosis not present

## 2018-10-16 DIAGNOSIS — E1140 Type 2 diabetes mellitus with diabetic neuropathy, unspecified: Secondary | ICD-10-CM | POA: Diagnosis present

## 2018-10-16 DIAGNOSIS — Z951 Presence of aortocoronary bypass graft: Secondary | ICD-10-CM

## 2018-10-16 DIAGNOSIS — B961 Klebsiella pneumoniae [K. pneumoniae] as the cause of diseases classified elsewhere: Secondary | ICD-10-CM | POA: Diagnosis present

## 2018-10-16 DIAGNOSIS — N179 Acute kidney failure, unspecified: Secondary | ICD-10-CM | POA: Diagnosis present

## 2018-10-16 DIAGNOSIS — Z992 Dependence on renal dialysis: Secondary | ICD-10-CM

## 2018-10-16 DIAGNOSIS — I2510 Atherosclerotic heart disease of native coronary artery without angina pectoris: Secondary | ICD-10-CM | POA: Diagnosis present

## 2018-10-16 DIAGNOSIS — Z89511 Acquired absence of right leg below knee: Secondary | ICD-10-CM | POA: Diagnosis not present

## 2018-10-16 DIAGNOSIS — C9110 Chronic lymphocytic leukemia of B-cell type not having achieved remission: Secondary | ICD-10-CM | POA: Diagnosis present

## 2018-10-16 DIAGNOSIS — E11649 Type 2 diabetes mellitus with hypoglycemia without coma: Secondary | ICD-10-CM | POA: Diagnosis present

## 2018-10-16 DIAGNOSIS — R0602 Shortness of breath: Secondary | ICD-10-CM | POA: Diagnosis not present

## 2018-10-16 DIAGNOSIS — I120 Hypertensive chronic kidney disease with stage 5 chronic kidney disease or end stage renal disease: Secondary | ICD-10-CM | POA: Diagnosis not present

## 2018-10-16 DIAGNOSIS — N3000 Acute cystitis without hematuria: Secondary | ICD-10-CM

## 2018-10-16 DIAGNOSIS — Z8674 Personal history of sudden cardiac arrest: Secondary | ICD-10-CM

## 2018-10-16 DIAGNOSIS — R059 Cough, unspecified: Secondary | ICD-10-CM

## 2018-10-16 DIAGNOSIS — E1151 Type 2 diabetes mellitus with diabetic peripheral angiopathy without gangrene: Secondary | ICD-10-CM | POA: Diagnosis present

## 2018-10-16 DIAGNOSIS — F329 Major depressive disorder, single episode, unspecified: Secondary | ICD-10-CM | POA: Diagnosis present

## 2018-10-16 DIAGNOSIS — Z89512 Acquired absence of left leg below knee: Secondary | ICD-10-CM | POA: Diagnosis not present

## 2018-10-16 DIAGNOSIS — I132 Hypertensive heart and chronic kidney disease with heart failure and with stage 5 chronic kidney disease, or end stage renal disease: Principal | ICD-10-CM | POA: Diagnosis present

## 2018-10-16 DIAGNOSIS — N186 End stage renal disease: Secondary | ICD-10-CM | POA: Diagnosis present

## 2018-10-16 DIAGNOSIS — R531 Weakness: Secondary | ICD-10-CM | POA: Diagnosis not present

## 2018-10-16 DIAGNOSIS — E669 Obesity, unspecified: Secondary | ICD-10-CM | POA: Diagnosis present

## 2018-10-16 DIAGNOSIS — I130 Hypertensive heart and chronic kidney disease with heart failure and stage 1 through stage 4 chronic kidney disease, or unspecified chronic kidney disease: Secondary | ICD-10-CM | POA: Diagnosis not present

## 2018-10-16 DIAGNOSIS — E871 Hypo-osmolality and hyponatremia: Secondary | ICD-10-CM | POA: Diagnosis not present

## 2018-10-16 DIAGNOSIS — N390 Urinary tract infection, site not specified: Secondary | ICD-10-CM | POA: Diagnosis not present

## 2018-10-16 DIAGNOSIS — Z794 Long term (current) use of insulin: Secondary | ICD-10-CM

## 2018-10-16 DIAGNOSIS — Z95810 Presence of automatic (implantable) cardiac defibrillator: Secondary | ICD-10-CM | POA: Diagnosis not present

## 2018-10-16 DIAGNOSIS — Z833 Family history of diabetes mellitus: Secondary | ICD-10-CM

## 2018-10-16 DIAGNOSIS — N189 Chronic kidney disease, unspecified: Secondary | ICD-10-CM | POA: Diagnosis not present

## 2018-10-16 DIAGNOSIS — E785 Hyperlipidemia, unspecified: Secondary | ICD-10-CM | POA: Diagnosis present

## 2018-10-16 DIAGNOSIS — I5043 Acute on chronic combined systolic (congestive) and diastolic (congestive) heart failure: Secondary | ICD-10-CM | POA: Diagnosis not present

## 2018-10-16 DIAGNOSIS — J209 Acute bronchitis, unspecified: Secondary | ICD-10-CM | POA: Diagnosis present

## 2018-10-16 DIAGNOSIS — D631 Anemia in chronic kidney disease: Secondary | ICD-10-CM | POA: Diagnosis present

## 2018-10-16 DIAGNOSIS — D696 Thrombocytopenia, unspecified: Secondary | ICD-10-CM | POA: Diagnosis not present

## 2018-10-16 DIAGNOSIS — Z7951 Long term (current) use of inhaled steroids: Secondary | ICD-10-CM

## 2018-10-16 DIAGNOSIS — Z90710 Acquired absence of both cervix and uterus: Secondary | ICD-10-CM

## 2018-10-16 DIAGNOSIS — R0902 Hypoxemia: Secondary | ICD-10-CM | POA: Diagnosis not present

## 2018-10-16 DIAGNOSIS — I25118 Atherosclerotic heart disease of native coronary artery with other forms of angina pectoris: Secondary | ICD-10-CM | POA: Diagnosis not present

## 2018-10-16 DIAGNOSIS — Z7901 Long term (current) use of anticoagulants: Secondary | ICD-10-CM

## 2018-10-16 DIAGNOSIS — K660 Peritoneal adhesions (postprocedural) (postinfection): Secondary | ICD-10-CM | POA: Diagnosis present

## 2018-10-16 DIAGNOSIS — J449 Chronic obstructive pulmonary disease, unspecified: Secondary | ICD-10-CM | POA: Diagnosis not present

## 2018-10-16 DIAGNOSIS — Z950 Presence of cardiac pacemaker: Secondary | ICD-10-CM

## 2018-10-16 DIAGNOSIS — Z6836 Body mass index (BMI) 36.0-36.9, adult: Secondary | ICD-10-CM

## 2018-10-16 DIAGNOSIS — L8990 Pressure ulcer of unspecified site, unspecified stage: Secondary | ICD-10-CM | POA: Diagnosis present

## 2018-10-16 DIAGNOSIS — Z801 Family history of malignant neoplasm of trachea, bronchus and lung: Secondary | ICD-10-CM

## 2018-10-16 DIAGNOSIS — K219 Gastro-esophageal reflux disease without esophagitis: Secondary | ICD-10-CM | POA: Diagnosis present

## 2018-10-16 DIAGNOSIS — R8271 Bacteriuria: Secondary | ICD-10-CM | POA: Diagnosis not present

## 2018-10-16 DIAGNOSIS — Z1611 Resistance to penicillins: Secondary | ICD-10-CM | POA: Diagnosis present

## 2018-10-16 DIAGNOSIS — I482 Chronic atrial fibrillation, unspecified: Secondary | ICD-10-CM | POA: Diagnosis not present

## 2018-10-16 LAB — RESPIRATORY PANEL BY PCR
Adenovirus: NOT DETECTED
Bordetella pertussis: NOT DETECTED
Chlamydophila pneumoniae: NOT DETECTED
Coronavirus 229E: NOT DETECTED
Coronavirus HKU1: NOT DETECTED
Coronavirus NL63: NOT DETECTED
Coronavirus OC43: NOT DETECTED
INFLUENZA B-RVPPCR: NOT DETECTED
Influenza A: NOT DETECTED
MYCOPLASMA PNEUMONIAE-RVPPCR: NOT DETECTED
Metapneumovirus: DETECTED — AB
Parainfluenza Virus 1: NOT DETECTED
Parainfluenza Virus 2: NOT DETECTED
Parainfluenza Virus 3: NOT DETECTED
Parainfluenza Virus 4: NOT DETECTED
Respiratory Syncytial Virus: NOT DETECTED
Rhinovirus / Enterovirus: NOT DETECTED

## 2018-10-16 LAB — GLUCOSE, CAPILLARY
Glucose-Capillary: 154 mg/dL — ABNORMAL HIGH (ref 70–99)
Glucose-Capillary: 147 mg/dL — ABNORMAL HIGH (ref 70–99)
Glucose-Capillary: 132 mg/dL — ABNORMAL HIGH (ref 70–99)
Glucose-Capillary: 98 mg/dL (ref 70–99)

## 2018-10-16 LAB — COMPREHENSIVE METABOLIC PANEL
ALT: 22 U/L (ref 0–44)
AST: 36 U/L (ref 15–41)
Albumin: 4.2 g/dL (ref 3.5–5.0)
Alkaline Phosphatase: 84 U/L (ref 38–126)
Anion gap: 9 (ref 5–15)
BUN: 38 mg/dL — ABNORMAL HIGH (ref 8–23)
CO2: 25 mmol/L (ref 22–32)
Calcium: 8.6 mg/dL — ABNORMAL LOW (ref 8.9–10.3)
Chloride: 101 mmol/L (ref 98–111)
Creatinine, Ser: 3.01 mg/dL — ABNORMAL HIGH (ref 0.44–1.00)
GFR calc non Af Amer: 15 mL/min — ABNORMAL LOW (ref >60)
GFR, EST AFRICAN AMERICAN: 17 mL/min — AB (ref >60)
Glucose, Bld: 185 mg/dL — ABNORMAL HIGH (ref 70–99)
Potassium: 4.2 mmol/L (ref 3.5–5.1)
Sodium: 135 mmol/L (ref 135–145)
TOTAL PROTEIN: 7.3 g/dL (ref 6.5–8.1)
Total Bilirubin: 1.3 mg/dL — ABNORMAL HIGH (ref 0.3–1.2)

## 2018-10-16 LAB — PROTIME-INR
INR: 2.65
PROTHROMBIN TIME: 27.9 s — AB (ref 11.4–15.2)

## 2018-10-16 LAB — CBC
HCT: 39.5 % (ref 36.0–46.0)
Hemoglobin: 12 g/dL (ref 12.0–15.0)
MCH: 25.2 pg — ABNORMAL LOW (ref 26.0–34.0)
MCHC: 30.4 g/dL (ref 30.0–36.0)
MCV: 82.8 fL (ref 80.0–100.0)
NRBC: 0 % (ref 0.0–0.2)
Platelets: 103 10*3/uL — ABNORMAL LOW (ref 150–400)
RBC: 4.77 MIL/uL (ref 3.87–5.11)
RDW: 18.6 % — ABNORMAL HIGH (ref 11.5–15.5)
WBC: 16.7 10*3/uL — ABNORMAL HIGH (ref 4.0–10.5)

## 2018-10-16 LAB — BRAIN NATRIURETIC PEPTIDE: B Natriuretic Peptide: 1501 pg/mL — ABNORMAL HIGH (ref 0.0–100.0)

## 2018-10-16 LAB — URINALYSIS, COMPLETE (UACMP) WITH MICROSCOPIC
Bilirubin Urine: NEGATIVE
Glucose, UA: NEGATIVE mg/dL
Ketones, ur: NEGATIVE mg/dL
Nitrite: NEGATIVE
Protein, ur: 100 mg/dL — AB
Specific Gravity, Urine: 1.011 (ref 1.005–1.030)
WBC, UA: >50 WBC/hpf — ABNORMAL HIGH (ref 0–5)
pH: 6 (ref 5.0–8.0)

## 2018-10-16 LAB — MRSA PCR SCREENING: MRSA by PCR: POSITIVE — AB

## 2018-10-16 LAB — TROPONIN I
Troponin I: 0.09 ng/mL (ref <0.03)
Troponin I: 0.12 ng/mL (ref <0.03)

## 2018-10-16 LAB — INFLUENZA PANEL BY PCR (TYPE A & B)
Influenza A By PCR: NEGATIVE
Influenza B By PCR: NEGATIVE

## 2018-10-16 MED ORDER — ALBUTEROL SULFATE (2.5 MG/3ML) 0.083% IN NEBU
2.5000 mg | INHALATION_SOLUTION | Freq: Four times a day (QID) | RESPIRATORY_TRACT | Status: DC
  Administered 2018-10-17 (×3): 2.5 mg via RESPIRATORY_TRACT
  Filled 2018-10-16 (×3): qty 3

## 2018-10-16 MED ORDER — IPRATROPIUM-ALBUTEROL 0.5-2.5 (3) MG/3ML IN SOLN
3.0000 mL | Freq: Once | RESPIRATORY_TRACT | Status: AC
  Administered 2018-10-16: 3 mL via RESPIRATORY_TRACT
  Filled 2018-10-16: qty 3

## 2018-10-16 MED ORDER — FUROSEMIDE 10 MG/ML IJ SOLN
40.0000 mg | Freq: Once | INTRAMUSCULAR | Status: AC
  Administered 2018-10-16: 40 mg via INTRAVENOUS
  Filled 2018-10-16: qty 4

## 2018-10-16 MED ORDER — AMLODIPINE BESYLATE 5 MG PO TABS
10.0000 mg | ORAL_TABLET | Freq: Every day | ORAL | Status: DC
  Administered 2018-10-16: 10 mg via ORAL

## 2018-10-16 MED ORDER — CARVEDILOL 6.25 MG PO TABS
3.1250 mg | ORAL_TABLET | Freq: Two times a day (BID) | ORAL | Status: DC
  Administered 2018-10-16: 3.125 mg via ORAL

## 2018-10-16 MED ORDER — INSULIN ASPART 100 UNIT/ML ~~LOC~~ SOLN
0.0000 [IU] | Freq: Every day | SUBCUTANEOUS | Status: DC
  Administered 2018-10-17: 2 [IU] via SUBCUTANEOUS
  Administered 2018-10-18: 3 [IU] via SUBCUTANEOUS
  Administered 2018-10-19: 4 [IU] via SUBCUTANEOUS
  Administered 2018-10-20: 5 [IU] via SUBCUTANEOUS
  Administered 2018-10-21: 4 [IU] via SUBCUTANEOUS
  Administered 2018-10-22: 2 [IU] via SUBCUTANEOUS
  Filled 2018-10-16 (×6): qty 1

## 2018-10-16 MED ORDER — SODIUM CHLORIDE 0.9 % IV SOLN
1.0000 g | Freq: Once | INTRAVENOUS | Status: AC
  Administered 2018-10-16: 1 g via INTRAVENOUS
  Filled 2018-10-16: qty 10

## 2018-10-16 MED ORDER — NITROGLYCERIN 0.4 MG SL SUBL: 0.4000 mg | SUBLINGUAL_TABLET | SUBLINGUAL | Status: DC | PRN

## 2018-10-16 MED ORDER — AMITRIPTYLINE HCL 25 MG PO TABS
25.0000 mg | ORAL_TABLET | Freq: Every day | ORAL | Status: DC
  Administered 2018-10-16 – 2018-10-30 (×15): 25 mg via ORAL
  Filled 2018-10-16 (×16): qty 1

## 2018-10-16 MED ORDER — HEPARIN SODIUM (PORCINE) 5000 UNIT/ML IJ SOLN
5000.0000 [IU] | Freq: Three times a day (TID) | INTRAMUSCULAR | Status: DC
  Filled 2018-10-16: qty 1

## 2018-10-16 MED ORDER — INSULIN ASPART 100 UNIT/ML ~~LOC~~ SOLN
0.0000 [IU] | Freq: Three times a day (TID) | SUBCUTANEOUS | Status: DC
  Administered 2018-10-16: 3 [IU] via SUBCUTANEOUS
  Administered 2018-10-16 (×2): 2 [IU] via SUBCUTANEOUS
  Administered 2018-10-17: 3 [IU] via SUBCUTANEOUS
  Administered 2018-10-18: 5 [IU] via SUBCUTANEOUS
  Administered 2018-10-18: 8 [IU] via SUBCUTANEOUS
  Administered 2018-10-18: 15 [IU] via SUBCUTANEOUS
  Administered 2018-10-19: 11 [IU] via SUBCUTANEOUS
  Administered 2018-10-19: 8 [IU] via SUBCUTANEOUS
  Administered 2018-10-19 – 2018-10-21 (×5): 11 [IU] via SUBCUTANEOUS
  Administered 2018-10-21: 1 [IU] via SUBCUTANEOUS
  Administered 2018-10-21: 8 [IU] via SUBCUTANEOUS
  Administered 2018-10-22 – 2018-10-23 (×3): 3 [IU] via SUBCUTANEOUS
  Administered 2018-10-23 – 2018-10-24 (×2): 2 [IU] via SUBCUTANEOUS
  Administered 2018-10-24: 3 [IU] via SUBCUTANEOUS
  Administered 2018-10-26 – 2018-10-27 (×2): 2 [IU] via SUBCUTANEOUS
  Administered 2018-10-28 – 2018-10-29 (×4): 3 [IU] via SUBCUTANEOUS
  Administered 2018-10-30 (×2): 2 [IU] via SUBCUTANEOUS
  Administered 2018-10-30 – 2018-10-31 (×2): 3 [IU] via SUBCUTANEOUS
  Filled 2018-10-16 (×32): qty 1

## 2018-10-16 MED ORDER — HYDRALAZINE HCL 20 MG/ML IJ SOLN: 5.0000 mg | Freq: Four times a day (QID) | INTRAMUSCULAR | Status: DC | PRN

## 2018-10-16 MED ORDER — CARVEDILOL 3.125 MG PO TABS
3.1250 mg | ORAL_TABLET | Freq: Every day | ORAL | Status: DC
  Administered 2018-10-17 – 2018-10-24 (×7): 3.125 mg via ORAL
  Filled 2018-10-16 (×8): qty 1

## 2018-10-16 MED ORDER — GABAPENTIN 100 MG PO CAPS
100.0000 mg | ORAL_CAPSULE | Freq: Two times a day (BID) | ORAL | Status: DC
  Administered 2018-10-16 – 2018-10-30 (×30): 100 mg via ORAL
  Filled 2018-10-16 (×31): qty 1

## 2018-10-16 MED ORDER — IPRATROPIUM BROMIDE 0.02 % IN SOLN
0.5000 mg | Freq: Four times a day (QID) | RESPIRATORY_TRACT | Status: DC | PRN
  Administered 2018-10-19: 0.5 mg via RESPIRATORY_TRACT
  Filled 2018-10-16: qty 2.5

## 2018-10-16 MED ORDER — FUROSEMIDE 10 MG/ML IJ SOLN: 40.0000 mg | Freq: Two times a day (BID) | INTRAMUSCULAR | Status: DC

## 2018-10-16 MED ORDER — SERTRALINE HCL 50 MG PO TABS
100.0000 mg | ORAL_TABLET | Freq: Every day | ORAL | Status: DC
  Administered 2018-10-16 – 2018-10-30 (×15): 100 mg via ORAL
  Filled 2018-10-16 (×15): qty 2

## 2018-10-16 MED ORDER — BUDESONIDE 0.5 MG/2ML IN SUSP
0.5000 mg | Freq: Two times a day (BID) | RESPIRATORY_TRACT | Status: DC
  Administered 2018-10-17 – 2018-10-31 (×27): 0.5 mg via RESPIRATORY_TRACT
  Filled 2018-10-16 (×28): qty 2

## 2018-10-16 MED ORDER — PANTOPRAZOLE SODIUM 40 MG PO TBEC
40.0000 mg | DELAYED_RELEASE_TABLET | Freq: Every day | ORAL | Status: DC
  Administered 2018-10-16 – 2018-10-30 (×15): 40 mg via ORAL
  Filled 2018-10-16 (×15): qty 1

## 2018-10-16 MED ORDER — ONDANSETRON HCL 4 MG/2ML IJ SOLN
4.0000 mg | Freq: Four times a day (QID) | INTRAMUSCULAR | Status: DC | PRN
  Administered 2018-10-16 – 2018-10-29 (×4): 4 mg via INTRAVENOUS
  Filled 2018-10-16 (×4): qty 2

## 2018-10-16 MED ORDER — ACETAMINOPHEN 650 MG RE SUPP: 650.0000 mg | Freq: Four times a day (QID) | RECTAL | Status: DC | PRN

## 2018-10-16 MED ORDER — ACETAMINOPHEN 325 MG PO TABS
650.0000 mg | ORAL_TABLET | Freq: Four times a day (QID) | ORAL | Status: DC | PRN
  Administered 2018-10-26: 650 mg via ORAL
  Filled 2018-10-16: qty 2

## 2018-10-16 MED ORDER — AMIODARONE HCL 200 MG PO TABS: 400.0000 mg | ORAL_TABLET | Freq: Every day | ORAL | Status: DC

## 2018-10-16 MED ORDER — SULFAMETHOXAZOLE-TRIMETHOPRIM 800-160 MG PO TABS: 1.0000 | ORAL_TABLET | Freq: Once | ORAL | Status: DC

## 2018-10-16 MED ORDER — FUROSEMIDE 10 MG/ML IJ SOLN
60.0000 mg | Freq: Two times a day (BID) | INTRAMUSCULAR | Status: DC
  Administered 2018-10-16: 60 mg via INTRAVENOUS
  Filled 2018-10-16: qty 6

## 2018-10-16 MED ORDER — WARFARIN SODIUM 4 MG PO TABS
4.0000 mg | ORAL_TABLET | Freq: Once | ORAL | Status: AC
  Administered 2018-10-16: 4 mg via ORAL
  Filled 2018-10-16: qty 1

## 2018-10-16 MED ORDER — AMIODARONE HCL 200 MG PO TABS
400.0000 mg | ORAL_TABLET | Freq: Two times a day (BID) | ORAL | Status: DC
  Administered 2018-10-16 – 2018-10-26 (×21): 400 mg via ORAL
  Filled 2018-10-16 (×22): qty 2

## 2018-10-16 MED ORDER — HYDROCOD POLST-CPM POLST ER 10-8 MG/5ML PO SUER
5.0000 mL | Freq: Once | ORAL | Status: AC
  Administered 2018-10-16: 5 mL via ORAL
  Filled 2018-10-16: qty 5

## 2018-10-16 MED ORDER — WARFARIN - PHARMACIST DOSING INPATIENT
Freq: Every day | Status: DC
  Administered 2018-10-16: 17:00:00
  Filled 2018-10-16: qty 1

## 2018-10-16 MED ORDER — SODIUM CHLORIDE 0.9% FLUSH
3.0000 mL | Freq: Two times a day (BID) | INTRAVENOUS | Status: DC
  Administered 2018-10-16 – 2018-10-30 (×29): 3 mL via INTRAVENOUS

## 2018-10-16 MED ORDER — ISOSORBIDE MONONITRATE ER 30 MG PO TB24
30.0000 mg | ORAL_TABLET | Freq: Every day | ORAL | Status: DC
  Administered 2018-10-17 – 2018-10-30 (×13): 30 mg via ORAL
  Filled 2018-10-16 (×14): qty 1

## 2018-10-16 MED ORDER — AMLODIPINE BESYLATE 10 MG PO TABS
10.0000 mg | ORAL_TABLET | Freq: Every day | ORAL | Status: DC
  Administered 2018-10-17 – 2018-10-30 (×12): 10 mg via ORAL
  Filled 2018-10-16 (×14): qty 1

## 2018-10-16 MED ORDER — SODIUM CHLORIDE 0.9 % IV SOLN
1.0000 g | INTRAVENOUS | Status: DC
  Administered 2018-10-17 – 2018-10-21 (×5): 1 g via INTRAVENOUS
  Filled 2018-10-16 (×5): qty 1

## 2018-10-16 MED ORDER — ISOSORBIDE MONONITRATE ER 60 MG PO TB24
30.0000 mg | ORAL_TABLET | Freq: Every day | ORAL | Status: DC
  Administered 2018-10-16: 30 mg via ORAL

## 2018-10-16 MED ORDER — ALBUTEROL SULFATE (2.5 MG/3ML) 0.083% IN NEBU: 2.5000 mg | INHALATION_SOLUTION | Freq: Four times a day (QID) | RESPIRATORY_TRACT | Status: DC | PRN

## 2018-10-16 MED ORDER — ATORVASTATIN CALCIUM 20 MG PO TABS
40.0000 mg | ORAL_TABLET | Freq: Every day | ORAL | Status: DC
  Administered 2018-10-16 – 2018-10-30 (×15): 40 mg via ORAL
  Filled 2018-10-16: qty 4
  Filled 2018-10-16 (×14): qty 2

## 2018-10-16 MED ORDER — ONDANSETRON HCL 4 MG PO TABS
4.0000 mg | ORAL_TABLET | Freq: Four times a day (QID) | ORAL | Status: DC | PRN
  Administered 2018-10-27: 4 mg via ORAL
  Filled 2018-10-16: qty 1

## 2018-10-16 NOTE — Progress Notes (Signed)
ANTICOAGULATION CONSULT NOTE - Initial Consult  Pharmacy Consult for Warfarin Indication: atrial fibrillation  Allergies  Allergen Reactions  . Piperacillin Other (See Comments)    Renal failure  . 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 See ID note**PER PT-CAUSED RENAL FAILURE**  . Zosyn [Piperacillin Sod-Tazobactam So] Other (See Comments)    Renal failure    Patient Measurements: Height: 5\' 8"  (172.7 cm) Weight: 212 lb (96.2 kg) IBW/kg (Calculated) : 63.9 Heparin Dosing Weight:    Vital Signs: Temp: 99.2 F (37.3 C) (02/05 0357) Temp Source: Oral (02/05 0357) BP: 156/70 (02/05 1100) Pulse Rate: 76 (02/05 1100)  Labs: Recent Labs    10/16/18 0405 10/16/18 0650 10/16/18 0800  HGB 12.0  --   --   HCT 39.5  --   --   PLT 103*  --   --   LABPROT  --   --  27.9*  INR  --   --  2.65  CREATININE 3.01*  --   --   TROPONINI  --  0.09*  --     Estimated Creatinine Clearance: 21.1 mL/min (A) (by C-G formula based on SCr of 3.01 mg/dL (H)).   Medical History: Past Medical History:  Diagnosis Date  . Amputation of left lower extremity below knee ([place])   . Amputation of right lower extremity below knee ([place])   . CAD (coronary artery disease)    a. 2004 Cardiac Arrest/CABG x 3 (LIMA->LAD, VG->OM, VG->RCA);  b. 06/2015 lexiscan MV: no significant ischemia, EF 48%, low risk->Med Rx.  . Chronic combined systolic and diastolic CHF (congestive heart failure) ([place])    a. 12/2014 Echo: EF 45-50%;  b. 08/2015 Echo: EF 45-50%, ant, antsept HK, mildly dil LA, nl RV, mild-mod TR, sev PAH (45mmHg); b. 08/2017 TEE: EF 40-45%, large PFO w/ L->R shunting.  . CKD (chronic kidney disease), stage IV ([place])   . CLL (chronic lymphocytic leukemia) ([place])   . Diabetes mellitus without complication ([place])   . Essential hypertension   . GERD (gastroesophageal reflux disease)   . Hiatal hernia   . History of cardiac arrest    a. 2004.  Marland Kitchen Hyperlipidemia   .  Ischemic cardiomyopathy    a. s/p MDT ICD (originally had 1638 lead-->gen change and lead revision ~ 2012 @ [place]); b. 12/2014 Echo: EF 45-50%;  c.08/2015 Echo: EF 45-50%; d. 08/2017 Echo: EF 40-45%.  Marland Kitchen PAD (peripheral artery disease) (HCC)    a. s/p bilat BKA  . Persistent atrial fibrillation    a. Dx 12/2014.  CHA2DS2VASc = 6--> warfarin;  b. 09/2015 s/p DCCV-->on amio; c. s/p DCCV 04/25/16; d. 07/2016 s/p RFCA/PVI in setting of recurrent afib despite amio; e. 05/2018 Recurrent afib.    Assessment: Patient is a 73yo female admitted for SOB. Pharmacy consulted for management of Warfarin for afib.   Goal of Therapy:  INR 2-3   Plan:  INR on admission is therapeutic. Looks like patient may have been taking Warfarin 4mg  prior to admission. Will order Warfarin 4mg  time one tonight. Daily INR to guide therapy.  Paulina Fusi, PharmD, BCPS 10/16/2018 12:12 PM

## 2018-10-16 NOTE — ED Notes (Signed)
Pt provided lunch tray.

## 2018-10-16 NOTE — ED Notes (Signed)
Placed pt on 2 L nasal cannula for comfort

## 2018-10-16 NOTE — Progress Notes (Signed)
Patient ID: Caroline Hernandez, female   DOB: 1945-12-17, 73 y.o.   MRN: [id number]  [facility] PROGRESS NOTE  Caroline Hernandez TIW:[id number] DOB: 24-Oct-1945 DOA: 10/16/2018 PCP: System, Provider Not In  HPI/Subjective: Patient with some cough and shortness of breath.  Some other family members are sick.  She had a fever for couple days.  Coughing up greenish phlegm.  Objective: Vitals:   10/16/18 1500 10/16/18 1615  BP: (!) 143/65 (!) 149/76  Pulse: 64 68  Resp: (!) 22 15  Temp:    SpO2: 94% 94%   No intake or output data in the 24 hours ending 10/16/18 1737 Filed Weights   10/16/18 0358  Weight: 96.2 kg    ROS: Review of Systems  Constitutional: Negative for chills and fever.  Eyes: Negative for blurred vision.  Respiratory: Positive for cough, shortness of breath and wheezing.   Cardiovascular: Negative for chest pain.  Gastrointestinal: Negative for abdominal pain, constipation, diarrhea, nausea and vomiting.  Genitourinary: Negative for dysuria.  Musculoskeletal: Negative for joint pain.  Neurological: Negative for dizziness and headaches.   Exam: Physical Exam  Constitutional: She is oriented to person, place, and time.  HENT:  Nose: No mucosal edema.  Mouth/Throat: No oropharyngeal exudate or posterior oropharyngeal edema.  Eyes: Pupils are equal, round, and reactive to light. Conjunctivae, EOM and lids are normal.  Neck: No JVD present. Carotid bruit is not present. No edema present. No thyroid mass and no thyromegaly present.  Cardiovascular: S1 normal and S2 normal. Exam reveals no gallop.  No murmur heard. Pulses:      Dorsalis pedis pulses are 2+ on the right side and 2+ on the left side.  Respiratory: No respiratory distress. She has decreased breath sounds in the right lower field and the left lower field. She has no wheezes. She has rhonchi in the right lower field and the left lower field. She has no rales.  GI: Soft. Bowel sounds are normal. There is no  abdominal tenderness.  Musculoskeletal:     Comments: Patient is a bilateral amputee but she states that her legs are little swollen.  Lymphadenopathy:    She has no cervical adenopathy.  Neurological: She is alert and oriented to person, place, and time. No cranial nerve deficit.  Skin: Skin is warm. No rash noted. Nails show no clubbing.  Psychiatric: She has a normal mood and affect.      Data Reviewed: Basic Metabolic Panel: Recent Labs  Lab 10/16/18 0405  NA 135  K 4.2  CL 101  CO2 25  GLUCOSE 185*  BUN 38*  CREATININE 3.01*  CALCIUM 8.6*   Liver Function Tests: Recent Labs  Lab 10/16/18 0405  AST 36  ALT 22  ALKPHOS 84  BILITOT 1.3*  PROT 7.3  ALBUMIN 4.2   CBC: Recent Labs  Lab 10/16/18 0405  WBC 16.7*  HGB 12.0  HCT 39.5  MCV 82.8  PLT 103*   Cardiac Enzymes: Recent Labs  Lab 10/16/18 0650 10/16/18 1614  TROPONINI 0.09* 0.12*   BNP (last 3 results) Recent Labs    10/16/18 0405  BNP 1,501.0*     CBG: Recent Labs  Lab 10/16/18 0819 10/16/18 1324 10/16/18 1649  GLUCAP 154* 147* 132*    Recent Results (from the past 240 hour(s))  Respiratory Panel by PCR     Status: Abnormal   Collection Time: 10/16/18  4:05 AM  Result Value Ref Range Status   Adenovirus NOT DETECTED NOT DETECTED Final  Coronavirus 229E NOT DETECTED NOT DETECTED Final    Comment: (NOTE) The Coronavirus on the Respiratory Panel, DOES NOT test for the novel  Coronavirus (2019 nCoV)    Coronavirus HKU1 NOT DETECTED NOT DETECTED Final   Coronavirus NL63 NOT DETECTED NOT DETECTED Final   Coronavirus OC43 NOT DETECTED NOT DETECTED Final   Metapneumovirus DETECTED (A) NOT DETECTED Final   Rhinovirus / Enterovirus NOT DETECTED NOT DETECTED Final   Influenza A NOT DETECTED NOT DETECTED Final   Influenza B NOT DETECTED NOT DETECTED Final   Parainfluenza Virus 1 NOT DETECTED NOT DETECTED Final   Parainfluenza Virus 2 NOT DETECTED NOT DETECTED Final   Parainfluenza  Virus 3 NOT DETECTED NOT DETECTED Final   Parainfluenza Virus 4 NOT DETECTED NOT DETECTED Final   Respiratory Syncytial Virus NOT DETECTED NOT DETECTED Final   Bordetella pertussis NOT DETECTED NOT DETECTED Final   Chlamydophila pneumoniae NOT DETECTED NOT DETECTED Final   Mycoplasma pneumoniae NOT DETECTED NOT DETECTED Final    Comment: Performed at [facility], [place] [address] [place], [place] [address]     Studies: Dg Chest 2 View  Result Date: 10/16/2018 CLINICAL DATA:  Initial evaluation for acute cough. EXAM: CHEST - 2 VIEW COMPARISON:  Prior radiograph from 04/14/2018. FINDINGS: Left-sided pacemaker/AICD in place. Median sternotomy wires noted. Stable cardiomegaly. Mediastinal silhouette normal. Aortic atherosclerosis. Lungs mildly hypoinflated. No focal infiltrates. Mild perihilar vascular congestion without overt pulmonary edema. No pleural effusion. No pneumothorax. No acute osseous finding. IMPRESSION: 1. Cardiomegaly with mild perihilar vascular congestion without overt pulmonary edema. 2. No other active cardiopulmonary disease. Electronically Signed   By: Jeannine Boga M.D.   On: 10/16/2018 04:54    Scheduled Meds: . amiodarone  400 mg Oral BID  . [START ON 10/30/2018] amiodarone  400 mg Oral Daily  . amitriptyline  25 mg Oral QHS  . [START ON 10/17/2018] amLODipine  10 mg Oral Daily  . atorvastatin  40 mg Oral QHS  . [START ON 10/17/2018] carvedilol  3.125 mg Oral Daily  . furosemide  60 mg Intravenous BID  . gabapentin  100 mg Oral BID  . insulin aspart  0-15 Units Subcutaneous TID WC  . insulin aspart  0-5 Units Subcutaneous QHS  . [START ON 10/17/2018] isosorbide mononitrate  30 mg Oral Daily  . pantoprazole  40 mg Oral QHS  . sertraline  100 mg Oral QHS  . sodium chloride flush  3 mL Intravenous Q12H  . Warfarin - Pharmacist Dosing Inpatient   Does not apply q1800   Continuous Infusions: . cefTRIAXone (ROCEPHIN)  IV      Assessment/Plan:  1. Acute on  chronic systolic congestive heart failure.  Continue IV Lasix.  Patient on low-dose Coreg.  Spironolactone contraindicated with chronic kidney disease.  No ACE or arm at this point also with chronic kidney disease. 2. Bronchitis.  Positive metapneumovirus on respiratory panel.  Continue nebulizer treatments. 3. Acute cystitis.  On Rocephin.  Follow-up urine culture. 4. Chronic atrial fibrillation.  On Coumadin and amiodarone and Coreg 5. Depression on Zoloft 6. Obesity BMI of 32.23.  Weight loss needed 7. Hyperlipidemia unspecified on atorvastatin 8. Type 2 diabetes mellitus on sliding scale insulin  Code Status:     Code Status Orders  (From admission, onward)         Start     Ordered   10/16/18 0650  Full code  Continuous     10/16/18 0649  Code Status History    Date Active Date Inactive Code Status Order ID Comments User Context   04/14/2018 1701 04/19/2018 1446 Full Code [id number]  Gorden Harms, MD Inpatient   03/07/2018 1058 03/10/2018 1548 DNR [id number]  [name], MD ED   07/18/2016 1718 07/19/2016 1717 Full Code [id number]  Thompson Grayer, MD Inpatient   06/12/2015 1810 06/14/2015 1928 Full Code [id number]  Loletha Grayer, MD ED    Advance Directive Documentation     Most Recent Value  Type of Advance Directive  Healthcare Power of Attorney, Living will  Pre-existing out of facility DNR order (yellow form or pink MOST form)  -  "MOST" Form in Place?  -      Disposition Plan: To be determined  Antibiotics:  Rocephin  Time spent: 45 minutes  [place]

## 2018-10-16 NOTE — ED Notes (Signed)
Pt unable to use bedpan. Pt assisted to bedside commode. Pt successfully had bowel movement. Pt's linen changed.

## 2018-10-16 NOTE — ED Notes (Signed)
Cath pt for urine

## 2018-10-16 NOTE — ED Provider Notes (Signed)
Caroline Hernandez _____________________________   First MD Initiated Contact with Patient 10/16/18 0400     (approximate)  I have reviewed the triage vital signs and the nursing notes.   HISTORY  Chief Complaint No chief complaint on file.   HPI Caroline Hernandez is a 73 y.o. female with below list of chronic medical conditions presents to the emergency department with generalized weakness nonproductive cough.  Patient also admits to foul-smelling urine and urinary frequency.  Patient denies fever afebrile on presentation oxygen saturation during evaluation 90% on room air.  Patient admits to receiving flu vaccination this year   Past Medical History:  Diagnosis Date  . Amputation of left lower extremity below knee ([place])   . Amputation of right lower extremity below knee ([place])   . CAD (coronary artery disease)    a. 2004 Cardiac Arrest/CABG x 3 (LIMA->LAD, VG->OM, VG->RCA);  b. 06/2015 lexiscan MV: no significant ischemia, EF 48%, low risk->Med Rx.  . Chronic combined systolic and diastolic CHF (congestive heart failure) ([place])    a. 12/2014 Echo: EF 45-50%;  b. 08/2015 Echo: EF 45-50%, ant, antsept HK, mildly dil LA, nl RV, mild-mod TR, sev PAH (83mmHg); b. 08/2017 TEE: EF 40-45%, large PFO w/ L->R shunting.  . CKD (chronic kidney disease), stage IV ([place])   . CLL (chronic lymphocytic leukemia) ([place])   . Diabetes mellitus without complication ([place])   . Essential hypertension   . GERD (gastroesophageal reflux disease)   . Hiatal hernia   . History of cardiac arrest    a. 2004.  Marland Kitchen Hyperlipidemia   . Ischemic cardiomyopathy    a. s/p MDT ICD (originally had 1779 lead-->gen change and lead revision ~ 2012 @ [place]); b. 12/2014 Echo: EF 45-50%;  c.08/2015 Echo: EF 45-50%; d. 08/2017 Echo: EF 40-45%.  Marland Kitchen PAD (peripheral artery disease) ([facility])    a. s/p bilat BKA  . Persistent atrial fibrillation    a. Dx 12/2014.  CHA2DS2VASc = 6-->  warfarin;  b. 09/2015 s/p DCCV-->on amio; c. s/p DCCV 04/25/16; d. 07/2016 s/p RFCA/PVI in setting of recurrent afib despite amio; e. 05/2018 Recurrent afib.    Patient Active Problem List   Diagnosis Date Noted  . CAP (community acquired pneumonia) 04/14/2018  . UTI (urinary tract infection) 03/07/2018  . A-fib ([place]) 07/18/2016  . Chronic diastolic CHF (congestive heart failure) ([place])   . SOB (shortness of breath)   . Chronic systolic CHF (congestive heart failure) ([place]) 08/20/2015  . CKD (chronic kidney disease), stage IV ([place])   . Ischemic cardiomyopathy   . Chronic combined systolic and diastolic CHF (congestive heart failure) ([place])   . Long term (current) use of anticoagulants [Z79.01] 06/23/2015  . Essential hypertension 06/18/2015  . Systolic and diastolic CHF, acute on chronic ([place])   . Acute on chronic renal failure ([place])   . Angina pectoris ([place])   . Coronary artery disease involving native coronary artery of native heart with angina pectoris with documented spasm ([place])   . 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 Persistent atrial fibrillation   . Bradycardia   . Chest pain 06/12/2015    Past Surgical History:  Procedure Laterality Date  . ABDOMINAL HYSTERECTOMY    . Amputation lower extremity bilaterally Bilateral   . CARDIAC CATHETERIZATION    . CARDIOVERSION N/A 10/09/2016   Procedure: CARDIOVERSION;  Surgeon: Lelon Perla, MD;  Location: [facility];  Service: Cardiovascular;  Laterality:  N/A;  . CORONARY ARTERY BYPASS GRAFT    . ELECTROPHYSIOLOGIC STUDY N/A 10/07/2015   Procedure: CARDIOVERSION;  Surgeon: Minna Merritts, MD;  Location: [facility];  Service: Cardiovascular;  Laterality: N/A;  . ELECTROPHYSIOLOGIC STUDY N/A 04/25/2016   Procedure: Cardioversion;  Surgeon: Minna Merritts, MD;  Location: [facility];  Service: Cardiovascular;  Laterality: N/A;  . ELECTROPHYSIOLOGIC STUDY N/A 07/18/2016   Procedure: Atrial Fibrillation  Ablation;  Surgeon: Thompson Grayer, MD;  Location: [facility];  Service: Cardiovascular;  Laterality: N/A;  . IMPLANTABLE CARDIOVERTER DEFIBRILLATOR IMPLANT  2005   [employer]   . TEE WITHOUT CARDIOVERSION N/A 07/18/2016   Procedure: TRANSESOPHAGEAL ECHOCARDIOGRAM (TEE);  Surgeon: Sueanne Margarita, MD;  Location: [facility];  Service: Cardiovascular;  Laterality: N/A;  . TEE WITHOUT CARDIOVERSION N/A 10/09/2016   Procedure: TRANSESOPHAGEAL ECHOCARDIOGRAM (TEE);  Surgeon: Lelon Perla, MD;  Location: [facility] ENDOSCOPY;  Service: Cardiovascular;  Laterality: N/A;    Prior to Admission medications   Medication Sig Start Date End Date Taking? Authorizing Provider  albuterol (PROVENTIL HFA;VENTOLIN HFA) 108 (90 Base) MCG/ACT inhaler Inhale 2 puffs into the lungs every 6 (six) hours as needed for wheezing or shortness of breath. 03/10/18   [name], MD  amiodarone (PACERONE) 400 MG tablet Take 1 tablet (400 mg) by mouth twice daily x 2 weeks, then take 1 tablet (400 mg) by mouth once daily x 2 weeks 10/15/18   Deboraha Sprang, MD  amitriptyline (ELAVIL) 25 MG tablet Take 25 mg by mouth at bedtime.     [provider]  amLODipine (NORVASC) 10 MG tablet TAKE 1 TABLET(10 MG) BY MOUTH DAILY 08/19/18   Minna Merritts, MD  atorvastatin (LIPITOR) 40 MG tablet Take 40 mg by mouth at bedtime.     [provider]  carvedilol (COREG) 3.125 MG tablet Take 1 tablet (3.125 mg) by mouth once daily 10/15/18   Deboraha Sprang, MD  Cholecalciferol (HM VITAMIN D3) 4000 UNITS CAPS Take 4,000 Units by mouth daily.    [provider]  gabapentin (NEURONTIN) 100 MG capsule Take 100 mg by mouth 2 (two) times daily.    [provider]  insulin aspart (NOVOLOG) 100 UNIT/ML injection Inject 5-10 Units into the skin 3 (three) times daily before meals. 10 units into the skin before breakfast then 5 units before lunch then 10 units before supper (evening meal)    [provider]    insulin glargine (LANTUS) 100 UNIT/ML injection Inject 14-38 Units into the skin 2 (two) times daily before a meal. 38 units into the skin before breakfast and 14 units before supper (evening meal)    [provider]  iron polysaccharides (NIFEREX) 150 MG capsule Take 150 mg by mouth daily.    [provider]  isosorbide mononitrate (IMDUR) 30 MG 24 hr tablet TAKE 2 TABLETS(60 MG) BY MOUTH TWICE DAILY 08/19/18   Minna Merritts, MD  losartan (COZAAR) 50 MG tablet Take 1 tablet (50 mg total) by mouth daily. 10/15/18 01/13/19  Deboraha Sprang, MD  Multiple Vitamins-Minerals (VITEYES AREDS FORMULA/LUTEIN) CAPS Take 1 capsule by mouth 2 (two) times daily.    [provider]  nitroGLYCERIN (NITROSTAT) 0.4 MG SL tablet Place 1 tablet (0.4 mg total) under the tongue every 5 (five) minutes as needed for chest pain. 06/14/15   Bettey Costa, MD  pantoprazole (PROTONIX) 40 MG tablet Take 40 mg by mouth at bedtime. 06/19/16   [provider]  PREVIDENT 5000  DRY MOUTH 1.1 % GEL dental gel Place 1 application onto teeth daily.     [provider]  sertraline (ZOLOFT) 100 MG tablet Take 100 mg by mouth at bedtime.  06/02/15   [provider]  torsemide (DEMADEX) 20 MG tablet Take 2 tablets (40 mg total) by mouth 2 (two) times daily. 10/08/18   Theora Gianotti, NP  vitamin C (ASCORBIC ACID) 500 MG tablet Take 500 mg by mouth daily.    [provider]  warfarin (COUMADIN) 4 MG tablet TAKE AS DIRECTED BY COUMADIN CLINIC 10/02/18   Minna Merritts, MD    Allergies Piperacillin; Piperacillin-tazobactam in dex; and Zosyn [piperacillin sod-tazobactam so]  Family History  Problem Relation Age of Onset  . CAD Mother   . Diabetes Mother   . Atrial fibrillation Mother   . Lung cancer Father   . Diabetes Father     Social History Social History   Tobacco Use  . Smoking status: Never Smoker  . Smokeless tobacco: Never Used  Substance Use Topics   . Alcohol use: No  . Drug use: No    Review of Systems Constitutional: No fever/chills Eyes: No visual changes. ENT: No sore throat. Cardiovascular: Denies chest pain. Respiratory:   Positive for cough and dyspnea Gastrointestinal: No abdominal pain.  No nausea, no vomiting.  No diarrhea.  No constipation. Genitourinary: Positive for urinary frequency and foul-smelling urine. Musculoskeletal: Negative for neck pain.  Negative for back pain. Integumentary: Negative for rash. Neurological: Negative for headaches, focal weakness or numbness.   ____________________________________________   PHYSICAL EXAM:  VITAL SIGNS: ED Triage Vitals  Enc Vitals Group     BP 10/16/18 0357 (!) 164/83     Pulse Rate 10/16/18 0357 78     Resp 10/16/18 0357 16     Temp 10/16/18 0357 99.2 F (37.3 C)     Temp Source 10/16/18 0357 Oral     SpO2 10/16/18 0357 91 %     Weight 10/16/18 0358 96.2 kg (212 lb)     Height 10/16/18 0358 1.727 m (5\' 8" )     Head Circumference --      Peak Flow --      Pain Score 10/16/18 0358 0     Pain Loc --      Pain Edu? --      Excl. in [place]? --     Constitutional: Alert and oriented. Well appearing and in no acute distress. Eyes: Conjunctivae are normal. Mouth/Throat: Mucous membranes are moist. Oropharynx non-erythematous. Neck: No stridor.  Cardiovascular: Normal rate, regular rhythm. Good peripheral circulation. Grossly normal heart sounds. Respiratory: Normal respiratory effort.  No retractions.  Tory wheezing  gastrointestinal: Soft and nontender. No distention.  Musculoskeletal: No lower extremity tenderness nor edema. No gross deformities of extremities. Neurologic:  Normal speech and language. No gross focal neurologic deficits are appreciated.  Skin:  Skin is warm, dry and intact. No rash noted.   ____________________________________________   LABS (all labs ordered are listed, but only abnormal results are displayed)  Labs Reviewed  CBC -  Abnormal; Notable for the following components:      Result Value   WBC 16.7 (*)    MCH 25.2 (*)    RDW 18.6 (*)    Platelets 103 (*)    All other components within normal limits  COMPREHENSIVE METABOLIC PANEL - Abnormal; Notable for the following components:   Glucose, Bld 185 (*)    BUN 38 (*)  Creatinine, Ser 3.01 (*)    Calcium 8.6 (*)    Total Bilirubin 1.3 (*)    GFR calc non Af Amer 15 (*)    GFR calc Af Amer 17 (*)    All other components within normal limits  BRAIN NATRIURETIC PEPTIDE - Abnormal; Notable for the following components:   B Natriuretic Peptide 1,501.0 (*)    All other components within normal limits  URINALYSIS, COMPLETE (UACMP) WITH MICROSCOPIC - Abnormal; Notable for the following components:   Color, Urine YELLOW (*)    APPearance CLOUDY (*)    Hgb urine dipstick MODERATE (*)    Protein, ur 100 (*)    Leukocytes, UA LARGE (*)    WBC, UA >50 (*)    Bacteria, UA MANY (*)    All other components within normal limits  INFLUENZA PANEL BY PCR (TYPE A & B)   ____________________________________________  EKG  ED ECG REPORT I, Clay N , the attending physician, personally viewed and interpreted this ECG.   Date: 10/16/2018  EKG Time: 4:01 AM  Rate: 90  Rhythm: Atrial fibrillation  Axis: Leftward axis  Intervals: Irregular RR interval  ST&T Change: None  ____________________________________________  RADIOLOGY I, Hudson N , personally viewed and evaluated these images (plain radiographs) as part of my medical decision making, as well as reviewing the written report by the radiologist.  ED MD interpretation: Cardiomegaly with mild peri-fire vascular congestion no overt pulmonary edema per radiologist  Official radiology report(s): Dg Chest 2 View  Result Date: 10/16/2018 CLINICAL DATA:  Initial evaluation for acute cough. EXAM: CHEST - 2 VIEW COMPARISON:  Prior radiograph from 04/14/2018. FINDINGS: Left-sided pacemaker/AICD in  place. Median sternotomy wires noted. Stable cardiomegaly. Mediastinal silhouette normal. Aortic atherosclerosis. Lungs mildly hypoinflated. No focal infiltrates. Mild perihilar vascular congestion without overt pulmonary edema. No pleural effusion. No pneumothorax. No acute osseous finding. IMPRESSION: 1. Cardiomegaly with mild perihilar vascular congestion without overt pulmonary edema. 2. No other active cardiopulmonary disease. Electronically Signed   By: Jeannine Boga M.D.   On: 10/16/2018 04:54     Procedures   ____________________________________________   INITIAL IMPRESSION / ASSESSMENT AND PLAN / ED COURSE  As part of my medical decision making, I reviewed the following data within the electronic MEDICAL RECORD NUMBER  73 year old female presented with above-stated history and physical exam concerning for possible pneumonia bronchitis URI influenza pulmonary edema.  Influenza  negative, chest x-ray revealed no evidence of pneumonia or overt pulmonary edema.  Amatory data revealed a BNP of 1500.  Urinalysis consistent with a UTI.  Patient given Lasix 40 mg IV ceftriaxone 1 g.  On reevaluation patient's oxygen saturation currently 85% on room air and as such patient discussed with Dr. Posey Pronto for hospital admission further evaluation and management. ____________________________________________  FINAL CLINICAL IMPRESSION(S) / ED DIAGNOSES  Final diagnoses:  Acute cystitis without hematuria  Hypoxia  Acute bronchitis, unspecified organism     MEDICATIONS GIVEN DURING THIS VISIT:  Medications  ipratropium-albuterol (DUONEB) 0.5-2.5 (3) MG/3ML nebulizer solution 3 mL (has no administration in time range)  ipratropium-albuterol (DUONEB) 0.5-2.5 (3) MG/3ML nebulizer solution 3 mL (has no administration in time range)  cefTRIAXone (ROCEPHIN) 1 g in sodium chloride 0.9 % 100 mL IVPB (has no administration in time range)  chlorpheniramine-HYDROcodone (TUSSIONEX) 10-8 MG/5ML suspension 5  mL (5 mLs Oral Given 10/16/18 0403)     ED Discharge Orders    None       Hernandez:  This document was prepared using  [employer] forces training and education officer and may include unintentional dictation errors.   Gregor Hams, MD 10/16/18 [phone]

## 2018-10-16 NOTE — H&P (Signed)
[facility] [place] NAME: Caroline Hernandez    MR#:  [id number]  DATE OF BIRTH:  31-Mar-1946  DATE OF ADMISSION:  10/16/2018  PRIMARY CARE PHYSICIAN: System, Provider Not In   REQUESTING/REFERRING PHYSICIAN: Dr. Owens Shark  CHIEF COMPLAINT:  No chief complaint on file.   HISTORY OF PRESENT ILLNESS:  Caroline Hernandez  is a 74 y.o. female with a known history listed below presented to emergency room for evaluation of shortness of breath, congestion, cough and foul-smelling urine.  Patient has these symptoms for last 1 to 2 days.  Patient noticed fluid started building up in her system.  Patient feels chest congestion.  Denies chest pain or tightness.  Complaining of shortness of breath with exertion.  Patient has cough without sputum production.  No fever or chills.  Patient complaining of foul-smelling urine every time she uses bathroom.  No other complaints.  In emergency room patient has initial evaluation with labs, chest x-ray and urine analysis.  UA suggestive of UTI.  Chest x-ray suggestive of mild pulmonary vascular congestion.  Patient with elevated proBNP.  Patient started on IV Rocephin and IV Lasix in emergency room.  Hospitalist team requested for admission.  PAST MEDICAL HISTORY:   Past Medical History:  Diagnosis Date  . Amputation of left lower extremity below knee ([place])   . Amputation of right lower extremity below knee ([place])   . CAD (coronary artery disease)    a. 2004 Cardiac Arrest/CABG x 3 (LIMA->LAD, VG->OM, VG->RCA);  b. 06/2015 lexiscan MV: no significant ischemia, EF 48%, low risk->Med Rx.  . Chronic combined systolic and diastolic CHF (congestive heart failure) ([place])    a. 12/2014 Echo: EF 45-50%;  b. 08/2015 Echo: EF 45-50%, ant, antsept HK, mildly dil LA, nl RV, mild-mod TR, sev PAH (21mmHg); b. 08/2017 TEE: EF 40-45%, large PFO w/ L->R shunting.  . CKD (chronic kidney disease), stage IV ([place])   . CLL (chronic lymphocytic  leukemia) ([place])   . Diabetes mellitus without complication ([place])   . Essential hypertension   . GERD (gastroesophageal reflux disease)   . Hiatal hernia   . History of cardiac arrest    a. 2004.  Marland Kitchen Hyperlipidemia   . Ischemic cardiomyopathy    a. s/p MDT ICD (originally had 0177 lead-->gen change and lead revision ~ 2012 @ [place]); b. 12/2014 Echo: EF 45-50%;  c.08/2015 Echo: EF 45-50%; d. 08/2017 Echo: EF 40-45%.  Marland Kitchen PAD (peripheral artery disease) (HCC)    a. s/p bilat BKA  . Persistent atrial fibrillation    a. Dx 12/2014.  CHA2DS2VASc = 6--> warfarin;  b. 09/2015 s/p DCCV-->on amio; c. s/p DCCV 04/25/16; d. 07/2016 s/p RFCA/PVI in setting of recurrent afib despite amio; e. 05/2018 Recurrent afib.    PAST SURGICAL HISTORY:   Past Surgical History:  Procedure Laterality Date  . ABDOMINAL HYSTERECTOMY    . Amputation lower extremity bilaterally Bilateral   . CARDIAC CATHETERIZATION    . CARDIOVERSION N/A 10/09/2016   Procedure: CARDIOVERSION;  Surgeon: Lelon Perla, MD;  Location: [facility] ENDOSCOPY;  Service: Cardiovascular;  Laterality: N/A;  . CORONARY ARTERY BYPASS GRAFT    . ELECTROPHYSIOLOGIC STUDY N/A 10/07/2015   Procedure: CARDIOVERSION;  Surgeon: Minna Merritts, MD;  Location: [facility] ORS;  Service: Cardiovascular;  Laterality: N/A;  . ELECTROPHYSIOLOGIC STUDY N/A 04/25/2016   Procedure: Cardioversion;  Surgeon: Minna Merritts, MD;  Location: [facility] ORS;  Service: Cardiovascular;  Laterality: N/A;  . ELECTROPHYSIOLOGIC  STUDY N/A 07/18/2016   Procedure: Atrial Fibrillation Ablation;  Surgeon: Thompson Grayer, MD;  Location: [facility] CV LAB;  Service: Cardiovascular;  Laterality: N/A;  . IMPLANTABLE CARDIOVERTER DEFIBRILLATOR IMPLANT  2005   [employer]   . TEE WITHOUT CARDIOVERSION N/A 07/18/2016   Procedure: TRANSESOPHAGEAL ECHOCARDIOGRAM (TEE);  Surgeon: Sueanne Margarita, MD;  Location: [facility] ENDOSCOPY;  Service: Cardiovascular;  Laterality: N/A;  . TEE WITHOUT CARDIOVERSION N/A 10/09/2016    Procedure: TRANSESOPHAGEAL ECHOCARDIOGRAM (TEE);  Surgeon: Lelon Perla, MD;  Location: [facility] ENDOSCOPY;  Service: Cardiovascular;  Laterality: N/A;    SOCIAL HISTORY:   Social History   Tobacco Use  . Smoking status: Never Smoker  . Smokeless tobacco: Never Used  Substance Use Topics  . Alcohol use: No    FAMILY HISTORY:   Family History  Problem Relation Age of Onset  . CAD Mother   . Diabetes Mother   . Atrial fibrillation Mother   . Lung cancer Father   . Diabetes Father     DRUG ALLERGIES:   Allergies  Allergen Reactions  . Piperacillin Other (See Comments)    Renal failure  . 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 See ID note**PER PT-CAUSED RENAL FAILURE**  . Zosyn [Piperacillin Sod-Tazobactam So] Other (See Comments)    Renal failure    REVIEW OF SYSTEMS:   ROS -12 point review of system reviewed positive as per HPI otherwise negative.  MEDICATIONS AT HOME:   Prior to Admission medications   Medication Sig Start Date End Date Taking? Authorizing Provider  albuterol (PROVENTIL HFA;VENTOLIN HFA) 108 (90 Base) MCG/ACT inhaler Inhale 2 puffs into the lungs every 6 (six) hours as needed for wheezing or shortness of breath. 03/10/18   [name], MD  amiodarone (PACERONE) 400 MG tablet Take 1 tablet (400 mg) by mouth twice daily x 2 weeks, then take 1 tablet (400 mg) by mouth once daily x 2 weeks 10/15/18   Deboraha Sprang, MD  amitriptyline (ELAVIL) 25 MG tablet Take 25 mg by mouth at bedtime.     [provider]  amLODipine (NORVASC) 10 MG tablet TAKE 1 TABLET(10 MG) BY MOUTH DAILY 08/19/18   Minna Merritts, MD  atorvastatin (LIPITOR) 40 MG tablet Take 40 mg by mouth at bedtime.     [provider]  carvedilol (COREG) 3.125 MG tablet Take 1 tablet (3.125 mg) by mouth once daily 10/15/18   Deboraha Sprang, MD  Cholecalciferol (HM VITAMIN D3) 4000 UNITS CAPS Take 4,000 Units by mouth daily.    [provider]  gabapentin (NEURONTIN) 100 MG capsule Take 100 mg by mouth 2 (two) times daily.    [provider]  insulin aspart (NOVOLOG) 100 UNIT/ML injection Inject 5-10 Units into the skin 3 (three) times daily before meals. 10 units into the skin before breakfast then 5 units before lunch then 10 units before supper (evening meal)    [provider]  insulin glargine (LANTUS) 100 UNIT/ML injection Inject 14-38 Units into the skin 2 (two) times daily before a meal. 38 units into the skin before breakfast and 14 units before supper (evening meal)    [provider]  iron polysaccharides (NIFEREX) 150 MG capsule Take 150 mg by mouth daily.    [provider]  isosorbide mononitrate (IMDUR) 30 MG 24 hr tablet TAKE 2 TABLETS(60 MG) BY MOUTH TWICE DAILY 08/19/18   Minna Merritts, MD  losartan (COZAAR) 50 MG  tablet Take 1 tablet (50 mg total) by mouth daily. 10/15/18 01/13/19  Deboraha Sprang, MD  Multiple Vitamins-Minerals (VITEYES AREDS FORMULA/LUTEIN) CAPS Take 1 capsule by mouth 2 (two) times daily.    [provider]  nitroGLYCERIN (NITROSTAT) 0.4 MG SL tablet Place 1 tablet (0.4 mg total) under the tongue every 5 (five) minutes as needed for chest pain. 06/14/15   Bettey Costa, MD  pantoprazole (PROTONIX) 40 MG tablet Take 40 mg by mouth at bedtime. 06/19/16   [provider]  PREVIDENT 5000 DRY MOUTH 1.1 % GEL dental gel Place 1 application onto teeth daily.     [provider]  sertraline (ZOLOFT) 100 MG tablet Take 100 mg by mouth at bedtime.  06/02/15   [provider]  torsemide (DEMADEX) 20 MG tablet Take 2 tablets (40 mg total) by mouth 2 (two) times daily. 10/08/18   Theora Gianotti, NP  vitamin C (ASCORBIC ACID) 500 MG tablet Take 500 mg by mouth daily.    [provider]  warfarin (COUMADIN) 4 MG tablet TAKE AS DIRECTED BY COUMADIN CLINIC 10/02/18   Minna Merritts, MD      VITAL SIGNS:  Blood  pressure (!) 149/114, pulse 79, temperature 99.2 F (37.3 C), temperature source Oral, resp. rate 19, height 5\' 8"  (1.727 m), weight 96.2 kg, SpO2 95 %.  PHYSICAL EXAMINATION:  Physical Exam  GENERAL:  73 y.o.-year-old patient lying in the bed with no acute distress.  EYES: Pupils equal, round, reactive to light and accommodation. No scleral icterus. Extraocular muscles intact.  HEENT: Head atraumatic, normocephalic. Oropharynx and nasopharynx clear.  NECK:  Supple, + jugular venous distention. No thyroid enlargement, no tenderness.  LUNGS: Bibasilar crackles, few wheezing heard CARDIOVASCULAR: S1, S2 normal. No murmurs, rubs, or gallops.  Irregularly irregular. ABDOMEN: Soft, nontender, nondistended. Bowel sounds present. No organomegaly or mass.  EXTREMITIES: B/L LE amputations.  NEUROLOGIC: Cranial nerves II through XII are intact. Muscle strength 5/5 in all extremities. Sensation intact. Gait not checked.  PSYCHIATRIC: The patient is alert and oriented x 3.  SKIN: Warm, dry  LABORATORY PANEL:   CBC Recent Labs  Lab 10/16/18 0405  WBC 16.7*  HGB 12.0  HCT 39.5  PLT 103*   ------------------------------------------------------------------------------------------------------------------  Chemistries  Recent Labs  Lab 10/16/18 0405  NA 135  K 4.2  CL 101  CO2 25  GLUCOSE 185*  BUN 38*  CREATININE 3.01*  CALCIUM 8.6*  AST 36  ALT 22  ALKPHOS 84  BILITOT 1.3*   ------------------------------------------------------------------------------------------------------------------  Cardiac Enzymes No results for input(s): TROPONINI in the last 168 hours. ------------------------------------------------------------------------------------------------------------------  RADIOLOGY:  Dg Chest 2 View  Result Date: 10/16/2018 CLINICAL DATA:  Initial evaluation for acute cough. EXAM: CHEST - 2 VIEW COMPARISON:  Prior radiograph from 04/14/2018. FINDINGS: Left-sided  pacemaker/AICD in place. Median sternotomy wires noted. Stable cardiomegaly. Mediastinal silhouette normal. Aortic atherosclerosis. Lungs mildly hypoinflated. No focal infiltrates. Mild perihilar vascular congestion without overt pulmonary edema. No pleural effusion. No pneumothorax. No acute osseous finding. IMPRESSION: 1. Cardiomegaly with mild perihilar vascular congestion without overt pulmonary edema. 2. No other active cardiopulmonary disease. Electronically Signed   By: Jeannine Boga M.D.   On: 10/16/2018 04:54      IMPRESSION AND PLAN:   1.  UTI: Patient started on IV Rocephin.  Follow-up culture.  Monitor leukocytosis.  2.  Acute on chronic systolic CHF exacerbation: Patient started on IV Lasix 40 mg twice daily.  Monitor creatinine while patient on Lasix.  Continue home medications.  Daily weight.  Monitor input and output.  3.  Possible acute kidney injury on chronic kidney disease stage IV: Patient with fluctuating baseline creatinine.  Hold losartan.  Monitor creatinine closely as patient will be on IV Lasix.  Avoid nephrotoxic agents.  4. Hypertension-monitor blood pressure.  Continue home medications.  PRN IV hydralazine.  5. diabetes mellitus type 2- monitor CBG.  Sliding scale insulin.  6. Chronic atrial fibrillation- rate-controlled.  Continue home medications.  Pharmacy to dose Coumadin.  7.  Chronic other medical problems: Monitor.  Continue home medication as ordered.  DVT prophylaxis: On Coumadin  All the records are reviewed and case discussed with ED provider. Management plans discussed with the patient who is in agreement.  CODE STATUS: Full  TOTAL TIME TAKING CARE OF THIS PATIENT: 36 minutes.    Sedalia Muta M.D on 10/16/2018 at 6:42 AM  Between 7am to 6pm - Pager - [phone]  After 6pm go to [email or web address] - Proofreader  [facility]  Office  [phone]  CC: Primary care physician; System, Provider Not  In

## 2018-10-16 NOTE — ED Notes (Signed)
Pt given breakfast tray

## 2018-10-16 NOTE — ED Triage Notes (Addendum)
Pt arrived from home via EMS with complaints of weakness and coughing. Pt also states a strong smell of urine and that she is prone to UTIs. Pt is alert and oriented x 4. VS per EMS BP-157/88 O2sat-100%RA. Hx of diabetes, cardiac history and bilateral amputee. Dr. Owens Shark at bedside. Pt also has a pacemaker and defib

## 2018-10-17 ENCOUNTER — Other Ambulatory Visit: Payer: Self-pay | Admitting: Internal Medicine

## 2018-10-17 DIAGNOSIS — I4819 Other persistent atrial fibrillation: Secondary | ICD-10-CM

## 2018-10-17 LAB — COMPREHENSIVE METABOLIC PANEL
ALK PHOS: 67 U/L (ref 38–126)
ALT: 25 U/L (ref 0–44)
AST: 45 U/L — ABNORMAL HIGH (ref 15–41)
Albumin: 3.7 g/dL (ref 3.5–5.0)
Anion gap: 10 (ref 5–15)
BUN: 52 mg/dL — ABNORMAL HIGH (ref 8–23)
CO2: 25 mmol/L (ref 22–32)
Calcium: 8.5 mg/dL — ABNORMAL LOW (ref 8.9–10.3)
Chloride: 100 mmol/L (ref 98–111)
Creatinine, Ser: 3.6 mg/dL — ABNORMAL HIGH (ref 0.44–1.00)
GFR calc Af Amer: 14 mL/min — ABNORMAL LOW (ref >60)
GFR calc non Af Amer: 12 mL/min — ABNORMAL LOW (ref >60)
GLUCOSE: 95 mg/dL (ref 70–99)
Potassium: 4.2 mmol/L (ref 3.5–5.1)
Sodium: 135 mmol/L (ref 135–145)
Total Bilirubin: 0.8 mg/dL (ref 0.3–1.2)
Total Protein: 7 g/dL (ref 6.5–8.1)

## 2018-10-17 LAB — PROTIME-INR
INR: 3.13
Prothrombin Time: 31.7 seconds — ABNORMAL HIGH (ref 11.4–15.2)

## 2018-10-17 LAB — CBC
HEMATOCRIT: 36.9 % (ref 36.0–46.0)
HEMOGLOBIN: 11.2 g/dL — AB (ref 12.0–15.0)
MCH: 25.3 pg — ABNORMAL LOW (ref 26.0–34.0)
MCHC: 30.4 g/dL (ref 30.0–36.0)
MCV: 83.5 fL (ref 80.0–100.0)
Platelets: 96 10*3/uL — ABNORMAL LOW (ref 150–400)
RBC: 4.42 MIL/uL (ref 3.87–5.11)
RDW: 18.8 % — ABNORMAL HIGH (ref 11.5–15.5)
WBC: 14.7 10*3/uL — ABNORMAL HIGH (ref 4.0–10.5)
nRBC: 0 % (ref 0.0–0.2)

## 2018-10-17 LAB — TROPONIN I: Troponin I: 0.1 ng/mL (ref <0.03)

## 2018-10-17 LAB — GLUCOSE, CAPILLARY
Glucose-Capillary: 90 mg/dL (ref 70–99)
Glucose-Capillary: 117 mg/dL — ABNORMAL HIGH (ref 70–99)
Glucose-Capillary: 167 mg/dL — ABNORMAL HIGH (ref 70–99)
Glucose-Capillary: 217 mg/dL — ABNORMAL HIGH (ref 70–99)

## 2018-10-17 LAB — APTT: aPTT: 63 seconds — ABNORMAL HIGH (ref 24–36)

## 2018-10-17 MED ORDER — METHYLPREDNISOLONE SODIUM SUCC 125 MG IJ SOLR
60.0000 mg | Freq: Two times a day (BID) | INTRAMUSCULAR | Status: DC
  Administered 2018-10-17 – 2018-10-21 (×9): 60 mg via INTRAVENOUS
  Filled 2018-10-17 (×10): qty 2

## 2018-10-17 MED ORDER — HYDROXYZINE HCL 25 MG PO TABS
25.0000 mg | ORAL_TABLET | Freq: Three times a day (TID) | ORAL | Status: DC | PRN
  Administered 2018-10-17 – 2018-10-18 (×3): 25 mg via ORAL
  Administered 2018-10-18: 12.5 mg via ORAL
  Administered 2018-10-19 – 2018-10-30 (×12): 25 mg via ORAL
  Filled 2018-10-17 (×17): qty 1

## 2018-10-17 MED ORDER — ALBUTEROL SULFATE (2.5 MG/3ML) 0.083% IN NEBU
2.5000 mg | INHALATION_SOLUTION | Freq: Three times a day (TID) | RESPIRATORY_TRACT | Status: DC
  Administered 2018-10-17 – 2018-10-31 (×35): 2.5 mg via RESPIRATORY_TRACT
  Filled 2018-10-17 (×40): qty 3

## 2018-10-17 MED ORDER — GUAIFENESIN-DM 100-10 MG/5ML PO SYRP
5.0000 mL | ORAL_SOLUTION | ORAL | Status: DC | PRN
  Administered 2018-10-17 – 2018-10-29 (×11): 5 mL via ORAL
  Filled 2018-10-17 (×12): qty 5

## 2018-10-17 MED ORDER — TRAMADOL HCL 50 MG PO TABS
50.0000 mg | ORAL_TABLET | Freq: Two times a day (BID) | ORAL | Status: DC | PRN
  Administered 2018-10-17 – 2018-10-30 (×8): 50 mg via ORAL
  Filled 2018-10-17 (×9): qty 1

## 2018-10-17 MED ORDER — GERHARDT'S BUTT CREAM
TOPICAL_CREAM | Freq: Two times a day (BID) | CUTANEOUS | Status: DC
  Administered 2018-10-17 – 2018-10-30 (×19): via TOPICAL
  Filled 2018-10-17: qty 1

## 2018-10-17 NOTE — Progress Notes (Signed)
PT Cancellation Note  Patient Details Name: Caroline Hernandez MRN: [id number] DOB: 04-Sep-1946   Cancelled Treatment:    Reason Eval/Treat Not Completed: Patient declined, no reason specified.  PT consult received.  Chart reviewed.  Pt reporting being SOB at rest and did not feel up to participating in therapy today (pt declined therapy today); O2 sats 90% on 3 L O2 via nasal cannula (nurse notified).  Will re-attempt PT evaluation tomorrow.  Leitha Bleak, PT 10/17/18, 3:42 PM [phone]

## 2018-10-17 NOTE — Progress Notes (Signed)
ANTICOAGULATION CONSULT NOTE - Initial Consult  Pharmacy Consult for Warfarin Indication: atrial fibrillation  Allergies  Allergen Reactions  . Piperacillin Other (See Comments)    Renal failure  . 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 See ID note**PER PT-CAUSED RENAL FAILURE**  . Zosyn [Piperacillin Sod-Tazobactam So] Other (See Comments)    Renal failure    Patient Measurements: Height: 5\' 8"  (172.7 cm) Weight: 234 lb 4.8 oz (106.3 kg) IBW/kg (Calculated) : 63.9 Heparin Dosing Weight:    Vital Signs: Temp: 98.8 F (37.1 C) (02/06 0756) Temp Source: Oral (02/06 0756) BP: 134/82 (02/06 0756) Pulse Rate: 68 (02/06 0756)  Labs: Recent Labs    10/16/18 0405 10/16/18 0650 10/16/18 0800 10/16/18 1614 10/17/18 0530  HGB 12.0  --   --   --  11.2*  HCT 39.5  --   --   --  36.9  PLT 103*  --   --   --  96*  APTT  --   --   --   --  63*  LABPROT  --   --  27.9*  --  31.7*  INR  --   --  2.65  --  3.13  CREATININE 3.01*  --   --   --  3.60*  TROPONINI  --  0.09*  --  0.12* 0.10*    Estimated Creatinine Clearance: 18.6 mL/min (A) (by C-G formula based on SCr of 3.6 mg/dL (H)).   Medical History: Past Medical History:  Diagnosis Date  . Amputation of left lower extremity below knee ([place])   . Amputation of right lower extremity below knee ([place])   . CAD (coronary artery disease)    a. 2004 Cardiac Arrest/CABG x 3 (LIMA->LAD, VG->OM, VG->RCA);  b. 06/2015 lexiscan MV: no significant ischemia, EF 48%, low risk->Med Rx.  . Chronic combined systolic and diastolic CHF (congestive heart failure) ([place])    a. 12/2014 Echo: EF 45-50%;  b. 08/2015 Echo: EF 45-50%, ant, antsept HK, mildly dil LA, nl RV, mild-mod TR, sev PAH (48mmHg); b. 08/2017 TEE: EF 40-45%, large PFO w/ L->R shunting.  . CKD (chronic kidney disease), stage IV ([place])   . CLL (chronic lymphocytic leukemia) ([place])   . Diabetes mellitus without complication ([place])   . Essential  hypertension   . GERD (gastroesophageal reflux disease)   . Hiatal hernia   . History of cardiac arrest    a. 2004.  Marland Kitchen Hyperlipidemia   . Ischemic cardiomyopathy    a. s/p MDT ICD (originally had 0177 lead-->gen change and lead revision ~ 2012 @ [place]); b. 12/2014 Echo: EF 45-50%;  c.08/2015 Echo: EF 45-50%; d. 08/2017 Echo: EF 40-45%.  Marland Kitchen PAD (peripheral artery disease) (H[facility])    a. s/p bilat BKA  . Persistent atrial fibrillation    a. Dx 12/2014.  CHA2DS2VASc = 6--> warfarin;  b. 09/2015 s/p DCCV-->on amio; c. s/p DCCV 04/25/16; d. 07/2016 s/p RFCA/PVI in setting of recurrent afib despite amio; e. 05/2018 Recurrent afib.    Assessment: Patient is a 73yo female admitted for SOB. Pharmacy consulted for management of Warfarin for afib.   WARFARIN COURSE DATE INR DOSE 2/6 2.65 4 mg 2/7 3.13  Goal of Therapy:  INR 2-3   Plan:  INR supratherapeutic this morning possibly due to initiating amiodarone. Will hold dose this evening since INR rose about 0.5 and recheck daily.   Laural Benes, PharmD, BCPS 10/17/2018 11:20 AM

## 2018-10-17 NOTE — Progress Notes (Addendum)
[facility] [place] NAME: Caroline Hernandez    MR#:  [id number]  DATE OF BIRTH:  03/28/1946  SUBJECTIVE:  CHIEF COMPLAINT:  No chief complaint on file. Continues to complain of chest congestion, shortness of breath and weakness On 2 L oxygen.  Saturation 71% on room air at rest.  REVIEW OF SYSTEMS:    Review of Systems  Constitutional: Positive for malaise/fatigue. Negative for chills and fever.  HENT: Negative for sore throat.   Eyes: Negative for blurred vision, double vision and pain.  Respiratory: Positive for cough, shortness of breath and wheezing. Negative for hemoptysis.   Cardiovascular: Negative for palpitations, orthopnea and leg swelling.  Gastrointestinal: Negative for abdominal pain, constipation, diarrhea, heartburn, nausea and vomiting.  Genitourinary: Negative for dysuria and hematuria.  Musculoskeletal: Negative for back pain and joint pain.  Skin: Negative for rash.  Neurological: Negative for sensory change, speech change, focal weakness and headaches.  Endo/Heme/Allergies: Does not bruise/bleed easily.  Psychiatric/Behavioral: Negative for depression. The patient is not nervous/anxious.     DRUG ALLERGIES:   Allergies  Allergen Reactions  . Piperacillin Other (See Comments)    Renal failure  . 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 See ID note**PER PT-CAUSED RENAL FAILURE**  . Zosyn [Piperacillin Sod-Tazobactam So] Other (See Comments)    Renal failure    VITALS:  Blood pressure 134/82, pulse 68, temperature 98.8 F (37.1 C), temperature source Oral, resp. rate 20, height 5\' 8"  (1.727 m), weight 106.3 kg, SpO2 (!) 71 %.  PHYSICAL EXAMINATION:   Physical Exam  GENERAL:  73 y.o.-year-old patient lying in the bed with conversational dyspnea.  Obese EYES: Pupils equal, round, reactive to light and accommodation. No scleral icterus. Extraocular muscles intact.  HEENT: Head  atraumatic, normocephalic. Oropharynx and nasopharynx clear.  NECK:  Supple, no jugular venous distention. No thyroid enlargement, no tenderness.  LUNGS: Bilateral wheezing CARDIOVASCULAR: S1, S2 normal. No murmurs, rubs, or gallops.  ABDOMEN: Soft, nontender, nondistended. Bowel sounds present. No organomegaly or mass.  EXTREMITIES: No cyanosis, clubbing or edema b/l.   Bilateral below-knee amputations NEUROLOGIC: Cranial nerves II through XII are intact. No focal Motor or sensory deficits b/l.   PSYCHIATRIC: The patient is alert and oriented x 3.  SKIN: No obvious rash, lesion, or ulcer.   LABORATORY PANEL:   CBC Recent Labs  Lab 10/17/18 0530  WBC 14.7*  HGB 11.2*  HCT 36.9  PLT 96*   ------------------------------------------------------------------------------------------------------------------ Chemistries  Recent Labs  Lab 10/17/18 0530  NA 135  K 4.2  CL 100  CO2 25  GLUCOSE 95  BUN 52*  CREATININE 3.60*  CALCIUM 8.5*  AST 45*  ALT 25  ALKPHOS 67  BILITOT 0.8   ------------------------------------------------------------------------------------------------------------------  Cardiac Enzymes Recent Labs  Lab 10/17/18 0530  TROPONINI 0.10*   ------------------------------------------------------------------------------------------------------------------  RADIOLOGY:  Dg Chest 2 View  Result Date: 10/16/2018 CLINICAL DATA:  Initial evaluation for acute cough. EXAM: CHEST - 2 VIEW COMPARISON:  Prior radiograph from 04/14/2018. FINDINGS: Left-sided pacemaker/AICD in place. Median sternotomy wires noted. Stable cardiomegaly. Mediastinal silhouette normal. Aortic atherosclerosis. Lungs mildly hypoinflated. No focal infiltrates. Mild perihilar vascular congestion without overt pulmonary edema. No pleural effusion. No pneumothorax. No acute osseous finding. IMPRESSION: 1. Cardiomegaly with mild perihilar vascular congestion without overt pulmonary edema. 2. No other  active cardiopulmonary disease. Electronically Signed   By: Jeannine Boga M.D.   On: 10/16/2018 04:54  ASSESSMENT AND PLAN:   *Acute bronchitis with acute hypoxic respiratory failure -Heart  IV steroids - Scheduled Nebulizers - Inhalers -Wean O2 as tolerated - Consult pulmonary if no improvement. Respiratory panel with Metapneumovirus. Needs continued inpatient stay due to severe hypoxia and shortness of breath.  *Acute kidney injury over CKD stage III.  Will hold IV Lasix.  *Acute on chronic systolic congestive heart failure.  Likely decompensated secondary to bronchitis.  Will have to hold Lasix due to worsening renal function.  Monitor input and output.  *UTI with leukocytosis.  Gram-negative rods in urine cultures.  Continue ceftriaxone  *Chronic atrial fibrillation.  Continue Coumadin, amiodarone and Coreg  *Diabetes mellitus type 2.  On sliding scale insulin.  Will likely have hyperglycemia secondary to being started on IV steroids   All the records are reviewed and case discussed with Care Management/Social Workerr. Management plans discussed with the patient, family and they are in agreement.  CODE STATUS: Full code  DVT Prophylaxis: SCDs  TOTAL TIME TAKING CARE OF THIS PATIENT: 35 minutes.   POSSIBLE D/C IN 1-2 DAYS, DEPENDING ON CLINICAL CONDITION.  Leia Alf Isom Kochan M.D on 10/17/2018 at 12:38 PM  Between 7am to 6pm - Pager - [phone]  After 6pm go to [email or web address] - password [email or web address] [facility]  Office  [phone]  CC: Primary care physician; System, Provider Not In  Note: This dictation was prepared with Dragon dictation along with smaller phrase technology. Any transcriptional errors that result from this process are unintentional.

## 2018-10-17 NOTE — Consult Note (Signed)
[facility] Nurse wound consult note Reason for Consult:Bilateral BKA with scabbed intact lesions from prosthesis.  Patient states she pads these areas when she wears prosthesis and does not need any topical treatment.  Scabs are chronic, dry and intact and more from friction than an acute injury. Partial thickness skin loss from moisture associated skin damage from urinary incontinence. Present to upper buttocks and groin folds.  Has female external urinary diversion (Purewick) in place.  Will add barrier cream.  No further needs at this time.  Wound type: see above Pressure Injury POA: Yes  Dressing procedure/placement/frequency: Cleanse buttocks and perineum with soap and water.  Apply Gerhardts butt paste twice daily and PRN soilage.  No disposable briefs or underpads while in bed.   Will not follow at this time.  Please re-consult if needed.  Domenic Moras MSN, RN, FNP-BC CWON Wound, Ostomy, Continence Nurse Pager [phone]

## 2018-10-18 LAB — BASIC METABOLIC PANEL
Anion gap: 13 (ref 5–15)
BUN: 70 mg/dL — ABNORMAL HIGH (ref 8–23)
CO2: 23 mmol/L (ref 22–32)
Calcium: 8.4 mg/dL — ABNORMAL LOW (ref 8.9–10.3)
Chloride: 96 mmol/L — ABNORMAL LOW (ref 98–111)
Creatinine, Ser: 3.86 mg/dL — ABNORMAL HIGH (ref 0.44–1.00)
GFR calc Af Amer: 13 mL/min — ABNORMAL LOW (ref >60)
GFR calc non Af Amer: 11 mL/min — ABNORMAL LOW (ref >60)
Glucose, Bld: 265 mg/dL — ABNORMAL HIGH (ref 70–99)
Potassium: 4.3 mmol/L (ref 3.5–5.1)
Sodium: 132 mmol/L — ABNORMAL LOW (ref 135–145)

## 2018-10-18 LAB — PROTIME-INR
INR: 3.96
PROTHROMBIN TIME: 38.1 s — AB (ref 11.4–15.2)

## 2018-10-18 LAB — GLUCOSE, CAPILLARY
Glucose-Capillary: 236 mg/dL — ABNORMAL HIGH (ref 70–99)
Glucose-Capillary: 408 mg/dL — ABNORMAL HIGH (ref 70–99)
Glucose-Capillary: 288 mg/dL — ABNORMAL HIGH (ref 70–99)
Glucose-Capillary: 292 mg/dL — ABNORMAL HIGH (ref 70–99)

## 2018-10-18 MED ORDER — MUPIROCIN 2 % EX OINT
1.0000 "application " | TOPICAL_OINTMENT | Freq: Two times a day (BID) | CUTANEOUS | Status: AC
  Administered 2018-10-18 – 2018-10-22 (×10): 1 via NASAL
  Filled 2018-10-18: qty 22

## 2018-10-18 MED ORDER — INSULIN GLARGINE 100 UNIT/ML ~~LOC~~ SOLN
20.0000 [IU] | Freq: Every day | SUBCUTANEOUS | Status: DC
  Administered 2018-10-18 – 2018-10-21 (×4): 20 [IU] via SUBCUTANEOUS
  Filled 2018-10-18 (×4): qty 0.2

## 2018-10-18 MED ORDER — CHLORHEXIDINE GLUCONATE CLOTH 2 % EX PADS
6.0000 | MEDICATED_PAD | Freq: Every day | CUTANEOUS | Status: DC
  Administered 2018-10-18 – 2018-10-22 (×4): 6 via TOPICAL

## 2018-10-18 NOTE — Progress Notes (Signed)
Inpatient Diabetes Program Recommendations  AACE/ADA: New Consensus Statement on Inpatient Glycemic Control   Target Ranges:  Prepandial:   less than 140 mg/dL      Peak postprandial:   less than 180 mg/dL (1-2 hours)      Critically ill patients:  140 - 180 mg/dL   Results for CLOIS, [place] (MRN [id number]) as of 10/18/2018 10:54  Ref. Range 10/17/2018 07:56 10/17/2018 11:50 10/17/2018 15:51 10/17/2018 21:25 10/18/2018 08:08  Glucose-Capillary Latest Ref Range: 70 - 99 mg/dL 90 117 (H) 167 (H) 217 (H) 236 (H)   Review of Glycemic Control  Diabetes history: DM2 Outpatient Diabetes medications: Lantus 38 units QAM, Lantus 14 units QPM, Novolog 10 units with breakfast, Novolog 5 units with lunch, Novolog 10 units with supper Current orders for Inpatient glycemic control: Novolog 0-15 units TID with meals, Novolog 0-5 units QHS; Solumedrol 60 mg BID  Inpatient Diabetes Program Recommendations:  Insulin - Basal: If steroids are continued, please consider ordering Lantus 10 units Q24H. HbgA1C: Please consider ordering an A1C to evaluate glycemic control over the past 2-3 months.  Thanks, Barnie Alderman, RN, MSN, CDE Diabetes Coordinator Inpatient Diabetes Program [phone] (Team Pager from 8am to 5pm)

## 2018-10-18 NOTE — Progress Notes (Signed)
[facility] [place] NAME: Deshonna Trnka    MR#:  [id number]  DATE OF BIRTH:  July 11, 1946  SUBJECTIVE:  CHIEF COMPLAINT:  No chief complaint on file.  Still has cough and shortness of breath.  On 3 L oxygen.  Afebrile.  Weak.  REVIEW OF SYSTEMS:    Review of Systems  Constitutional: Positive for malaise/fatigue. Negative for chills and fever.  HENT: Negative for sore throat.   Eyes: Negative for blurred vision, double vision and pain.  Respiratory: Positive for cough, shortness of breath and wheezing. Negative for hemoptysis.   Cardiovascular: Negative for palpitations, orthopnea and leg swelling.  Gastrointestinal: Negative for abdominal pain, constipation, diarrhea, heartburn, nausea and vomiting.  Genitourinary: Negative for dysuria and hematuria.  Musculoskeletal: Negative for back pain and joint pain.  Skin: Negative for rash.  Neurological: Negative for sensory change, speech change, focal weakness and headaches.  Endo/Heme/Allergies: Does not bruise/bleed easily.  Psychiatric/Behavioral: Negative for depression. The patient is not nervous/anxious.     DRUG ALLERGIES:   Allergies  Allergen Reactions  . Piperacillin Other (See Comments)    Renal failure  . 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 See ID note**PER PT-CAUSED RENAL FAILURE**  . Zosyn [Piperacillin Sod-Tazobactam So] Other (See Comments)    Renal failure    VITALS:  Blood pressure (!) 143/68, pulse 70, temperature 98.3 F (36.8 C), temperature source Oral, resp. rate 20, height 5\' 8"  (1.727 m), weight 109.3 kg, SpO2 97 %.  PHYSICAL EXAMINATION:   Physical Exam  GENERAL:  73 y.o.-year-old patient lying in the bed . Unable to speak in full sentences without shortness of breath EYES: Pupils equal, round, reactive to light and accommodation. No scleral icterus. Extraocular muscles intact.  HEENT: Head atraumatic,  normocephalic. Oropharynx and nasopharynx clear.  NECK:  Supple, no jugular venous distention. No thyroid enlargement, no tenderness.  LUNGS: Bilateral wheezing, coarse breath sounds CARDIOVASCULAR: S1, S2 normal. No murmurs, rubs, or gallops.  ABDOMEN: Soft, nontender, nondistended. Bowel sounds present. No organomegaly or mass.  EXTREMITIES: No cyanosis, clubbing or edema b/l.   Bilateral below-knee amputations NEUROLOGIC: Cranial nerves II through XII are intact. No focal Motor or sensory deficits b/l.   PSYCHIATRIC: The patient is alert and oriented x 3.  SKIN: No obvious rash, lesion, or ulcer.   LABORATORY PANEL:   CBC Recent Labs  Lab 10/17/18 0530  WBC 14.7*  HGB 11.2*  HCT 36.9  PLT 96*   ------------------------------------------------------------------------------------------------------------------ Chemistries  Recent Labs  Lab 10/17/18 0530 10/18/18 0621  NA 135 132*  K 4.2 4.3  CL 100 96*  CO2 25 23  GLUCOSE 95 265*  BUN 52* 70*  CREATININE 3.60* 3.86*  CALCIUM 8.5* 8.4*  AST 45*  --   ALT 25  --   ALKPHOS 67  --   BILITOT 0.8  --    ------------------------------------------------------------------------------------------------------------------  Cardiac Enzymes Recent Labs  Lab 10/17/18 0530  TROPONINI 0.10*   ------------------------------------------------------------------------------------------------------------------  RADIOLOGY:  No results found.   ASSESSMENT AND PLAN:   *Acute bronchitis with acute hypoxic respiratory failure -IV steroids - Scheduled Nebulizers - Inhalers -Wean O2 as tolerated Respiratory panel with Metapneumovirus. Still needs oxygen and is short of breath.  No significant improvement at this point.  Will need continued inpatient care.  *Acute kidney injury over CKD stage III.   IV Lasix discontinued.  Mild worsening of creatinine today.  Will repeat tomorrow.  Patient  sees Dr. Candiss Norse of nephrology.  Will  consult tomorrow if no improvement.  *Acute on chronic systolic congestive heart failure.  Likely decompensated secondary to bronchitis.   No signs of fluid overload at this time.  Was on IV Lasix initially which was stopped due to acute kidney injury.  *UTI with leukocytosis.  Klebsiella in urine cultures.  Sensitivity pending. continue ceftriaxone  *Chronic atrial fibrillation.  Continue Coumadin, amiodarone and Coreg  *Diabetes mellitus type 2.  On sliding scale insulin.  Patient is on Lantus at home and notices hypoglycemic episodes into 50s and 60s.  Here blood sugars were normal prior to starting on steroids.  Today I noticed that they have trended up into 200s.  Will restart Lantus.  Will need home doses lowered at discharge.  All the records are reviewed and case discussed with Care Management/Social Worker Management plans discussed with the patient, family and they are in agreement.  CODE STATUS: Full code  DVT Prophylaxis: SCDs  TOTAL TIME TAKING CARE OF THIS PATIENT: 35 minutes.   POSSIBLE D/C IN 1-2 DAYS, DEPENDING ON CLINICAL CONDITION.  Leia Alf Eulas Schweitzer M.D on 10/18/2018 at 11:53 AM  Between 7am to 6pm - Pager - [phone]  After 6pm go to [email or web address] - password [email or web address] [facility]  Office  [phone]  CC: Primary care physician; System, Provider Not In  Note: This dictation was prepared with Dragon dictation along with smaller phrase technology. Any transcriptional errors that result from this process are unintentional.

## 2018-10-18 NOTE — Plan of Care (Signed)
RD consulted for nutrition education regarding CHF.  RD provided "Heart Failure Nutrition Therapy" handout from the Academy of Nutrition and Dietetics.   Reviewed patient's dietary recall. Pt eats 3 meals a day, with snacks in between. Pt has breakfast around 7am and it is often a boiled egg and oatmeal with black coffee. Lunch will be a [place] sandwich with a piece of fruit with 1/2 c water and 1/2 c milk. Dinner may be a meat with a green salad, and with 1/2 c water and 1/2 c milk. Pt reported that she has had education on diabetes and renal but not much on CHF. Pt mentioned that she watches her intake of green vegetables and salt. She said she does not salt her food and only eats at [facility] maybe once a week. Her snack is often graham crackers.  Provided examples on ways to decrease sodium intake in diet (rinsing canned vegetables, different spices to use in cooking). Encouraged pt to continue not to salt food and to keep limiting processed foods (fast foods, frozen meals). Encouraged her to continue to have her fruits and vegetables.   RD discussed why it is important for patient to adhere to diet recommendations, and emphasized the role of fluids, foods to avoid, and importance of weighing self daily. Teach back method used.  Expect fair to good compliance.  Body mass index is 36.64 kg/m.  Pt meets criteria for Obesity II based on current BMI.  Current diet order is Renal, 1.2L fluid restriction, patient is consuming approximately 100% of meals at this time. Labs and medications reviewed.   No further nutrition interventions warranted at this time. If additional nutrition issues arise, please re-consult RD.   [facility] Intern

## 2018-10-18 NOTE — Progress Notes (Signed)
PT Cancellation Note  Patient Details Name: Carolynn Tuley MRN: [id number] DOB: 13-May-1946   Cancelled Treatment:    Reason Eval/Treat Not Completed: Patient declined, no reason specified.  Pt reporting not feeling well today and feels too tired to participate in therapy (pt declining PT today) but reports she will participate tomorrow with therapy.  Additional information obtained from pt:  Pt lives in 1 level home with daughter, SIL, and granddaughter with ramp to enter.  No h/o recent falls.  Pt uses scooter for house work but otherwise ambulatory short household distances with B LE prosthesis and walker.  DME include RW, BSC, shower seat, electric scooter, manual w/c, and hospital bed.  Has raised toilet with grab bar; takes sponge bath vs does shower with assist (has tub shower with grab bar and shower seat).  Places gauze pads between skin and prosthesis d/t skin concerns.  Will re-attempt PT evaluation tomorrow.   Leitha Bleak, PT 10/18/18, 2:00 PM [phone]

## 2018-10-18 NOTE — Progress Notes (Signed)
ANTICOAGULATION CONSULT NOTE - Initial Consult  Pharmacy Consult for Warfarin Indication: atrial fibrillation  Allergies  Allergen Reactions  . Piperacillin Other (See Comments)    Renal failure  . 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 See ID note**PER PT-CAUSED RENAL FAILURE**  . Zosyn [Piperacillin Sod-Tazobactam So] Other (See Comments)    Renal failure    Patient Measurements: Height: 5\' 8"  (172.7 cm) Weight: 241 lb (109.3 kg) IBW/kg (Calculated) : 63.9 Heparin Dosing Weight:    Vital Signs: Temp: 97.5 F (36.4 C) (02/07 0506) Temp Source: Oral (02/07 0506) BP: 133/66 (02/07 0506) Pulse Rate: 72 (02/07 0506)  Labs: Recent Labs    10/16/18 0405 10/16/18 0650 10/16/18 0800 10/16/18 1614 10/17/18 0530 10/18/18 0621  HGB 12.0  --   --   --  11.2*  --   HCT 39.5  --   --   --  36.9  --   PLT 103*  --   --   --  96*  --   APTT  --   --   --   --  63*  --   LABPROT  --   --  27.9*  --  31.7* 38.1*  INR  --   --  2.65  --  3.13 3.96  CREATININE 3.01*  --   --   --  3.60* 3.86*  TROPONINI  --  0.09*  --  0.12* 0.10*  --     Estimated Creatinine Clearance: 17.6 mL/min (A) (by C-G formula based on SCr of 3.86 mg/dL (H)).   Medical History: Past Medical History:  Diagnosis Date  . Amputation of left lower extremity below knee ([place])   . Amputation of right lower extremity below knee ([place])   . CAD (coronary artery disease)    a. 2004 Cardiac Arrest/CABG x 3 (LIMA->LAD, VG->OM, VG->RCA);  b. 06/2015 lexiscan MV: no significant ischemia, EF 48%, low risk->Med Rx.  . Chronic combined systolic and diastolic CHF (congestive heart failure) ([place])    a. 12/2014 Echo: EF 45-50%;  b. 08/2015 Echo: EF 45-50%, ant, antsept HK, mildly dil LA, nl RV, mild-mod TR, sev PAH (70mmHg); b. 08/2017 TEE: EF 40-45%, large PFO w/ L->R shunting.  . CKD (chronic kidney disease), stage IV ([place])   . CLL (chronic lymphocytic leukemia) ([place])   . Diabetes  mellitus without complication ([place])   . Essential hypertension   . GERD (gastroesophageal reflux disease)   . Hiatal hernia   . History of cardiac arrest    a. 2004.  Marland Kitchen Hyperlipidemia   . Ischemic cardiomyopathy    a. s/p MDT ICD (originally had 6144 lead-->gen change and lead revision ~ 2012 @ [place]); b. 12/2014 Echo: EF 45-50%;  c.08/2015 Echo: EF 45-50%; d. 08/2017 Echo: EF 40-45%.  Marland Kitchen PAD (peripheral artery disease) (H[facility])    a. s/p bilat BKA  . Persistent atrial fibrillation    a. Dx 12/2014.  CHA2DS2VASc = 6--> warfarin;  b. 09/2015 s/p DCCV-->on amio; c. s/p DCCV 04/25/16; d. 07/2016 s/p RFCA/PVI in setting of recurrent afib despite amio; e. 05/2018 Recurrent afib.    Assessment: Patient is a 73yo female admitted for SOB. Pharmacy consulted for management of Warfarin for afib.   WARFARIN COURSE DATE INR DOSE 2/6 2.65 4 mg 2/7 3.13 HELD 2/8 3.96  Goal of Therapy:  INR 2-3   Plan:  INR supratherapeutic this morning possibly due to initiating amiodarone. Will hold dose again this evening and  recheck daily.   Laural Benes, PharmD, BCPS 10/18/2018 7:23 AM

## 2018-10-19 DIAGNOSIS — L8990 Pressure ulcer of unspecified site, unspecified stage: Secondary | ICD-10-CM

## 2018-10-19 LAB — URINE CULTURE

## 2018-10-19 LAB — BASIC METABOLIC PANEL WITH GFR
Anion gap: 11 (ref 5–15)
BUN: 86 mg/dL — ABNORMAL HIGH (ref 8–23)
CO2: 23 mmol/L (ref 22–32)
Calcium: 8.7 mg/dL — ABNORMAL LOW (ref 8.9–10.3)
Chloride: 96 mmol/L — ABNORMAL LOW (ref 98–111)
Creatinine, Ser: 3.97 mg/dL — ABNORMAL HIGH (ref 0.44–1.00)
GFR calc Af Amer: 12 mL/min — ABNORMAL LOW
GFR calc non Af Amer: 11 mL/min — ABNORMAL LOW
Glucose, Bld: 309 mg/dL — ABNORMAL HIGH (ref 70–99)
Potassium: 4.3 mmol/L (ref 3.5–5.1)
Sodium: 130 mmol/L — ABNORMAL LOW (ref 135–145)

## 2018-10-19 LAB — PROTIME-INR
INR: 3.82
Prothrombin Time: 37 s — ABNORMAL HIGH (ref 11.4–15.2)

## 2018-10-19 LAB — GLUCOSE, CAPILLARY
Glucose-Capillary: 290 mg/dL — ABNORMAL HIGH (ref 70–99)
Glucose-Capillary: 361 mg/dL — ABNORMAL HIGH (ref 70–99)
Glucose-Capillary: 311 mg/dL — ABNORMAL HIGH (ref 70–99)
Glucose-Capillary: 314 mg/dL — ABNORMAL HIGH (ref 70–99)

## 2018-10-19 NOTE — Progress Notes (Signed)
ANTICOAGULATION CONSULT NOTE -   Pharmacy Consult for Warfarin Indication: atrial fibrillation  Allergies  Allergen Reactions  . Piperacillin Other (See Comments)    Renal failure  . 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 See ID note**PER PT-CAUSED RENAL FAILURE**  . Zosyn [Piperacillin Sod-Tazobactam So] Other (See Comments)    Renal failure    Patient Measurements: Height: 5\' 8"  (172.7 cm) Weight: 242 lb (109.8 kg) IBW/kg (Calculated) : 63.9 Heparin Dosing Weight:    Vital Signs: Temp: 97.5 F (36.4 C) (02/08 0817) Temp Source: Oral (02/08 0817) BP: 145/73 (02/08 0817) Pulse Rate: 66 (02/08 0817)  Labs: Recent Labs    10/16/18 1614 10/17/18 0530 10/18/18 0621 10/19/18 0507  HGB  --  11.2*  --   --   HCT  --  36.9  --   --   PLT  --  96*  --   --   APTT  --  63*  --   --   LABPROT  --  31.7* 38.1* 37.0*  INR  --  3.13 3.96 3.82  CREATININE  --  3.60* 3.86* 3.97*  TROPONINI 0.12* 0.10*  --   --     Estimated Creatinine Clearance: 17.1 mL/min (A) (by C-G formula based on SCr of 3.97 mg/dL (H)).   Medical History: Past Medical History:  Diagnosis Date  . Amputation of left lower extremity below knee ([place])   . Amputation of right lower extremity below knee ([place])   . CAD (coronary artery disease)    a. 2004 Cardiac Arrest/CABG x 3 (LIMA->LAD, VG->OM, VG->RCA);  b. 06/2015 lexiscan MV: no significant ischemia, EF 48%, low risk->Med Rx.  . Chronic combined systolic and diastolic CHF (congestive heart failure) ([place])    a. 12/2014 Echo: EF 45-50%;  b. 08/2015 Echo: EF 45-50%, ant, antsept HK, mildly dil LA, nl RV, mild-mod TR, sev PAH (75mmHg); b. 08/2017 TEE: EF 40-45%, large PFO w/ L->R shunting.  . CKD (chronic kidney disease), stage IV ([place])   . CLL (chronic lymphocytic leukemia) ([place])   . Diabetes mellitus without complication ([place])   . Essential hypertension   . GERD (gastroesophageal reflux disease)   . Hiatal hernia    . History of cardiac arrest    a. 2004.  Marland Kitchen Hyperlipidemia   . Ischemic cardiomyopathy    a. s/p MDT ICD (originally had 5732 lead-->gen change and lead revision ~ 2012 @ [place]); b. 12/2014 Echo: EF 45-50%;  c.08/2015 Echo: EF 45-50%; d. 08/2017 Echo: EF 40-45%.  Marland Kitchen PAD (peripheral artery disease) (H[facility])    a. s/p bilat BKA  . Persistent atrial fibrillation    a. Dx 12/2014.  CHA2DS2VASc = 6--> warfarin;  b. 09/2015 s/p DCCV-->on amio; c. s/p DCCV 04/25/16; d. 07/2016 s/p RFCA/PVI in setting of recurrent afib despite amio; e. 05/2018 Recurrent afib.    Assessment: Patient is a 73yo female admitted for SOB. Pharmacy consulted for management of Warfarin for afib.   WARFARIN COURSE DATE INR DOSE 2/5 2.65 4 mg 2/6 3.13 HELD 2/7 3.96     HELD 2/8       3.82  Goal of Therapy:  INR 2-3   Plan:  INR supratherapeutic this morning possibly due to initiating amiodarone. Will hold dose again this evening and recheck daily.   Olivia Canter, [facility] 10/19/2018 10:46 AM

## 2018-10-19 NOTE — Progress Notes (Addendum)
CCMD called and reported that pt just have a 2.48 seconds and HR went down to 38 but went right back up.On assessment pt was stable. VSS. Notify prime. Will continue to monitor.  Update 0106: Talked to Dr. Jannifer Franklin and states to monitor the pt. Will continue to monitor.

## 2018-10-19 NOTE — Evaluation (Signed)
Physical Therapy Evaluation Patient Details Name: Caroline Hernandez MRN: [id number] DOB: 1946-08-23 Today's Date: 10/19/2018   History of Present Illness  73 yo female with onset of CHF and 20# wgt gain was admitted to diurese, and noted elevated troponin and had foul smelling urine.  Nonproductive cough, congestion, UTI, ABT started.  PMHx:  B BK amputation, CAD, MI, CHF, CKD, CLL, hiatal hernia, ischemic cardiomyopathy, a-fib  Clinical Impression  Pt was seen for mobility and strengthening to balance and control use of RW for steps to chair.  Her plan is to progress as possible to walking and transferring to power device, and to increase safety with all standing and transfers.  Her acute therapy will be to initiate all these goals and focus on her standing stability with stump socks if available.      Follow Up Recommendations SNF    Equipment Recommendations  Other (comment);None recommended by PT(recommend prosthetics consult as outpatient)    Recommendations for Other Services       Precautions / Restrictions Precautions Precautions: Fall(telemetry) Required Braces or Orthoses: Other Brace(B BK prosthetics) Restrictions Weight Bearing Restrictions: No      Mobility  Bed Mobility Overal bed mobility: Needs Assistance Bed Mobility: Supine to Sit     Supine to sit: Min guard     General bed mobility comments: using elevated HOB and bedrail to sit up side of bed  Transfers Overall transfer level: Needs assistance Equipment used: Rolling walker (2 wheeled);1 person hand held assist Transfers: Sit to/from Stand Sit to Stand: Mod assist         General transfer comment: mod with cues either from side of bed or chair  Ambulation/Gait Ambulation/Gait assistance: Min assist Gait Distance (Feet): 5 Feet Assistive device: Rolling walker (2 wheeled);1 person hand held assist Gait Pattern/deviations: Step-to pattern;Decreased stride length;Wide base of support(has trouble with  getting L leg under her due to ROM loss hip) Gait velocity: reduced Gait velocity interpretation: <1.8 ft/sec, indicate of risk for recurrent falls    Stairs            Wheelchair Mobility    Modified Rankin (Stroke Patients Only)       Balance Overall balance assessment: Needs assistance Sitting-balance support: Feet supported;Bilateral upper extremity supported Sitting balance-Leahy Scale: Fair     Standing balance support: Bilateral upper extremity supported;During functional activity Standing balance-Leahy Scale: Poor                               Pertinent Vitals/Pain Pain Assessment: No/denies pain    Home Living Family/patient expects to be discharged to:: Private residence Living Arrangements: Children Available Help at Discharge: Family Type of Home: House Home Access: Ramped entrance     Home Layout: One level Home Equipment: Environmental consultant - 2 wheels;Bedside commode;Shower seat;Electric scooter;Wheelchair - manual;Hospital bed Additional Comments: has family in the evening and nearby help    Prior Function Level of Independence: Independent with assistive device(s)         Comments: pt states she typically does cooking and cleaning,  family shops and assists to sort pills.       Hand Dominance   Dominant Hand: Right    Extremity/Trunk Assessment   Upper Extremity Assessment Upper Extremity Assessment: Overall WFL for tasks assessed    Lower Extremity Assessment Lower Extremity Assessment: Overall WFL for tasks assessed    Cervical / Trunk Assessment Cervical / Trunk Assessment: Normal  Communication  Communication: No difficulties  Cognition Arousal/Alertness: Awake/alert Behavior During Therapy: WFL for tasks assessed/performed Overall Cognitive Status: Within Functional Limits for tasks assessed                                 General Comments: able to discuss her history accurately      General Comments  General comments (skin integrity, edema, etc.): pt has dirty sleeves to layer on for prosthesis use, smell of urine    Exercises     Assessment/Plan    PT Assessment Patient needs continued PT services  PT Problem List Decreased range of motion;Decreased activity tolerance;Decreased balance;Decreased mobility;Decreased coordination;Decreased knowledge of use of DME;Decreased safety awareness;Cardiopulmonary status limiting activity;Obesity       PT Treatment Interventions DME instruction;Gait training;Functional mobility training;Therapeutic activities;Therapeutic exercise;Balance training;Neuromuscular re-education;Patient/family education    PT Goals (Current goals can be found in the Care Plan section)  Acute Rehab PT Goals Patient Stated Goal: to get home and feel stronger PT Goal Formulation: With patient Time For Goal Achievement: 11/02/18 Potential to Achieve Goals: Good    Frequency Min 2X/week   Barriers to discharge Inaccessible home environment;Decreased caregiver support home with ramped entrance and no assistance at times    Co-evaluation               AM-PAC PT "6 Clicks" Mobility  Outcome Measure Help needed turning from your back to your side while in a flat bed without using bedrails?: A Little Help needed moving from lying on your back to sitting on the side of a flat bed without using bedrails?: A Little Help needed moving to and from a bed to a chair (including a wheelchair)?: A Little Help needed standing up from a chair using your arms (e.g., wheelchair or bedside chair)?: A Lot Help needed to walk in hospital room?: A Lot Help needed climbing 3-5 steps with a railing? : Total 6 Click Score: 14    End of Session Equipment Utilized During Treatment: Gait belt;Oxygen Activity Tolerance: Patient tolerated treatment well;Patient limited by fatigue Patient left: in chair;with call bell/phone within reach;with chair alarm set;with nursing/sitter in  room Nurse Communication: Mobility status;Other (comment)(instruction for use of prosthetics) PT Visit Diagnosis: Unsteadiness on feet (R26.81);Other abnormalities of gait and mobility (R26.89);Difficulty in walking, not elsewhere classified (R26.2)    Time: 1607-3710 PT Time Calculation (min) (ACUTE ONLY): 29 min   Charges:   PT Evaluation $PT Eval Moderate Complexity: 1 Mod PT Treatments $Therapeutic Activity: 8-22 mins       Ramond Dial 10/19/2018, 5:36 PM  Mee Hives, PT MS Acute Rehab Dept. Number: [place] [facility] [place]

## 2018-10-19 NOTE — Progress Notes (Signed)
[facility] [place] NAME: Caroline Hernandez    MR#:  [id number]  DATE OF BIRTH:  1946-09-02  SUBJECTIVE:  CHIEF COMPLAINT:  No chief complaint on file.  Still has mild cough and shortness of breath.  On 2 L oxygen.  Afebrile.   REVIEW OF SYSTEMS:    Review of Systems  Constitutional: Positive for malaise/fatigue. Negative for chills and fever.  HENT: Negative for sore throat.   Eyes: Negative for blurred vision, double vision and pain.  Respiratory: Positive for cough, shortness of breath and wheezing. Negative for hemoptysis.   Cardiovascular: Negative for palpitations, orthopnea and leg swelling.  Gastrointestinal: Negative for abdominal pain, constipation, diarrhea, heartburn, nausea and vomiting.  Genitourinary: Negative for dysuria and hematuria.  Musculoskeletal: Negative for back pain and joint pain.  Skin: Negative for rash.  Neurological: Negative for sensory change, speech change, focal weakness and headaches.  Endo/Heme/Allergies: Does not bruise/bleed easily.  Psychiatric/Behavioral: Negative for depression. The patient is not nervous/anxious.     DRUG ALLERGIES:   Allergies  Allergen Reactions  . Piperacillin Other (See Comments)    Renal failure  . 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 See ID note**PER PT-CAUSED RENAL FAILURE**  . Zosyn [Piperacillin Sod-Tazobactam So] Other (See Comments)    Renal failure    VITALS:  Blood pressure (!) 145/73, pulse 66, temperature (!) 97.5 F (36.4 C), temperature source Oral, resp. rate 20, height 5\' 8"  (1.727 m), weight 109.8 kg, SpO2 97 %.  PHYSICAL EXAMINATION:   Physical Exam  GENERAL:  73 y.o.-year-old patient lying in the bed . Unable to speak in full sentences without shortness of breath EYES: Pupils equal, round, reactive to light and accommodation. No scleral icterus. Extraocular muscles intact.  HEENT: Head atraumatic,  normocephalic. Oropharynx and nasopharynx clear.  NECK:  Supple, no jugular venous distention. No thyroid enlargement, no tenderness.  LUNGS: Bilateral wheezing, coarse breath sounds CARDIOVASCULAR: S1, S2 normal. No murmurs, rubs, or gallops.  ABDOMEN: Soft, nontender, nondistended. Bowel sounds present. No organomegaly or mass.  EXTREMITIES: No cyanosis, clubbing or edema b/l.   Bilateral below-knee amputations NEUROLOGIC: Cranial nerves II through XII are intact. No focal Motor or sensory deficits b/l.   PSYCHIATRIC: The patient is alert and oriented x 3.  SKIN: No obvious rash, lesion, or ulcer.   LABORATORY PANEL:   CBC Recent Labs  Lab 10/17/18 0530  WBC 14.7*  HGB 11.2*  HCT 36.9  PLT 96*   ------------------------------------------------------------------------------------------------------------------ Chemistries  Recent Labs  Lab 10/17/18 0530  10/19/18 0507  NA 135   < > 130*  K 4.2   < > 4.3  CL 100   < > 96*  CO2 25   < > 23  GLUCOSE 95   < > 309*  BUN 52*   < > 86*  CREATININE 3.60*   < > 3.97*  CALCIUM 8.5*   < > 8.7*  AST 45*  --   --   ALT 25  --   --   ALKPHOS 67  --   --   BILITOT 0.8  --   --    < > = values in this interval not displayed.   ------------------------------------------------------------------------------------------------------------------  Cardiac Enzymes Recent Labs  Lab 10/17/18 0530  TROPONINI 0.10*   ------------------------------------------------------------------------------------------------------------------  RADIOLOGY:  No results found.   ASSESSMENT AND PLAN:   *Acute bronchitis with acute hypoxic respiratory failure -IV steroids - Scheduled  Nebulizers - Inhalers -Wean O2 as tolerated Respiratory panel with Metapneumovirus. Still needs oxygen and is short of breath.  No significant improvement at this point.  Will need continued inpatient care. Wean oxygen as tolerated  *Acute kidney injury over CKD stage  III.   Creatinine has worsened Nephrology consult Off diuretics Monitor renal function  *Acute on chronic systolic congestive heart failure.  Likely decompensated secondary to bronchitis.   No signs of fluid overload at this time.  Was on IV Lasix initially which was stopped due to acute kidney injury.  *UTI with leukocytosis.  Klebsiella in urine cultures.  Sensitivity pending. continue ceftriaxone antibiotic  *Chronic atrial fibrillation.  Continue Coumadin, amiodarone and Coreg  *Diabetes mellitus type 2.  On sliding scale insulin along with Lantus insulin Monitor blood sugars closely to avoid hypoglycemia  All the records are reviewed and case discussed with Care Management/Social Worker Management plans discussed with the patient, family and they are in agreement.  CODE STATUS: Full code  DVT Prophylaxis: SCDs  TOTAL TIME TAKING CARE OF THIS PATIENT: 37 minutes.   POSSIBLE D/C IN 1-2 DAYS, DEPENDING ON CLINICAL CONDITION.  Saundra Shelling M.D on 10/19/2018 at 1:34 PM  Between 7am to 6pm - Pager - [phone]  After 6pm go to [email or web address] - password [email or web address] [facility]  Office  [phone]  CC: Primary care physician; System, Provider Not In  Note: This dictation was prepared with Dragon dictation along with smaller phrase technology. Any transcriptional errors that result from this process are unintentional.

## 2018-10-19 NOTE — Plan of Care (Signed)
  Problem: Education: Goal: Ability to demonstrate management of disease process will improve Outcome: Progressing Goal: Ability to verbalize understanding of medication therapies will improve Outcome: Progressing   

## 2018-10-20 ENCOUNTER — Inpatient Hospital Stay: Payer: Medicare HMO

## 2018-10-20 LAB — BASIC METABOLIC PANEL
Anion gap: 12 (ref 5–15)
BUN: 98 mg/dL — ABNORMAL HIGH (ref 8–23)
CO2: 23 mmol/L (ref 22–32)
Calcium: 8.4 mg/dL — ABNORMAL LOW (ref 8.9–10.3)
Chloride: 97 mmol/L — ABNORMAL LOW (ref 98–111)
Creatinine, Ser: 3.98 mg/dL — ABNORMAL HIGH (ref 0.44–1.00)
GFR calc Af Amer: 12 mL/min — ABNORMAL LOW (ref >60)
GFR calc non Af Amer: 11 mL/min — ABNORMAL LOW (ref >60)
Glucose, Bld: 305 mg/dL — ABNORMAL HIGH (ref 70–99)
Potassium: 4.1 mmol/L (ref 3.5–5.1)
Sodium: 132 mmol/L — ABNORMAL LOW (ref 135–145)

## 2018-10-20 LAB — GLUCOSE, CAPILLARY
GLUCOSE-CAPILLARY: 320 mg/dL — AB (ref 70–99)
GLUCOSE-CAPILLARY: 316 mg/dL — AB (ref 70–99)
Glucose-Capillary: 345 mg/dL — ABNORMAL HIGH (ref 70–99)
Glucose-Capillary: 332 mg/dL — ABNORMAL HIGH (ref 70–99)
Glucose-Capillary: 301 mg/dL — ABNORMAL HIGH (ref 70–99)
Glucose-Capillary: 345 mg/dL — ABNORMAL HIGH (ref 70–99)

## 2018-10-20 LAB — PROTIME-INR
INR: 3.64
Prothrombin Time: 35.7 seconds — ABNORMAL HIGH (ref 11.4–15.2)

## 2018-10-20 MED ORDER — SODIUM CHLORIDE 0.9 % IV SOLN
INTRAVENOUS | Status: DC | PRN
  Administered 2018-10-20 – 2018-10-21 (×3): 500 mL via INTRAVENOUS

## 2018-10-20 NOTE — Progress Notes (Signed)
ANTICOAGULATION CONSULT NOTE -   Pharmacy Consult for Warfarin Indication: atrial fibrillation  Allergies  Allergen Reactions  . Piperacillin Other (See Comments)    Renal failure  . 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 See ID note**PER PT-CAUSED RENAL FAILURE**  . Zosyn [Piperacillin Sod-Tazobactam So] Other (See Comments)    Renal failure    Patient Measurements: Height: 5\' 8"  (172.7 cm) Weight: 246 lb 0.5 oz (111.6 kg) IBW/kg (Calculated) : 63.9 Heparin Dosing Weight:    Vital Signs: Temp: 97.9 F (36.6 C) (02/09 0824) Temp Source: Oral (02/09 0824) BP: 128/59 (02/09 0824) Pulse Rate: 63 (02/09 0824)  Labs: Recent Labs    10/18/18 0621 10/19/18 0507 10/20/18 0244  LABPROT 38.1* 37.0* 35.7*  INR 3.96 3.82 3.64  CREATININE 3.86* 3.97* 3.98*    Estimated Creatinine Clearance: 17.2 mL/min (A) (by C-G formula based on SCr of 3.98 mg/dL (H)).   Medical History: Past Medical History:  Diagnosis Date  . Amputation of left lower extremity below knee ([place])   . Amputation of right lower extremity below knee ([place])   . CAD (coronary artery disease)    a. 2004 Cardiac Arrest/CABG x 3 (LIMA->LAD, VG->OM, VG->RCA);  b. 06/2015 lexiscan MV: no significant ischemia, EF 48%, low risk->Med Rx.  . Chronic combined systolic and diastolic CHF (congestive heart failure) ([place])    a. 12/2014 Echo: EF 45-50%;  b. 08/2015 Echo: EF 45-50%, ant, antsept HK, mildly dil LA, nl RV, mild-mod TR, sev PAH (36mmHg); b. 08/2017 TEE: EF 40-45%, large PFO w/ L->R shunting.  . CKD (chronic kidney disease), stage IV ([place])   . CLL (chronic lymphocytic leukemia) ([place])   . Diabetes mellitus without complication ([place])   . Essential hypertension   . GERD (gastroesophageal reflux disease)   . Hiatal hernia   . History of cardiac arrest    a. 2004.  Marland Kitchen Hyperlipidemia   . Ischemic cardiomyopathy    a. s/p MDT ICD (originally had 5053 lead-->gen change and lead  revision ~ 2012 @ [facility]); b. 12/2014 Echo: EF 45-50%;  c.08/2015 Echo: EF 45-50%; d. 08/2017 Echo: EF 40-45%.  Marland Kitchen PAD (peripheral artery disease) ([facility])    a. s/p bilat BKA  . Persistent atrial fibrillation    a. Dx 12/2014.  CHA2DS2VASc = 6--> warfarin;  b. 09/2015 s/p DCCV-->on amio; c. s/p DCCV 04/25/16; d. 07/2016 s/p RFCA/PVI in setting of recurrent afib despite amio; e. 05/2018 Recurrent afib.    Assessment: Patient is a 73yo female admitted for SOB. Pharmacy consulted for management of Warfarin for afib.   WARFARIN COURSE DATE INR DOSE 2/5 2.65 4 mg 2/6 3.13 HELD 2/7 3.96     HELD 2/8       3.82     HELD 2/9       3.64      Goal of Therapy:  INR 2-3   Plan:  INR supratherapeutic this morning possibly due to initiating amiodarone. Will hold dose again this evening and recheck daily.   Olivia Canter, [facility] 10/20/2018 9:43 AM

## 2018-10-20 NOTE — Plan of Care (Signed)
  Problem: Education: Goal: Ability to verbalize understanding of medication therapies will improve Outcome: Progressing   Problem: Activity: Goal: Capacity to carry out activities will improve Outcome: Progressing   

## 2018-10-20 NOTE — Progress Notes (Signed)
[facility] [place] NAME: Caroline Hernandez    MR#:  [id number]  DATE OF BIRTH:  22-Apr-1946  SUBJECTIVE:  Patient seen and evaluated today Has mild cough No fever No chest pain On oxygen via nasal cannula at 2 L  REVIEW OF SYSTEMS:    Review of Systems  Constitutional: Positive for malaise/fatigue. Negative for chills and fever.  HENT: Negative for sore throat.   Eyes: Negative for blurred vision, double vision and pain.  Respiratory: Positive for cough, shortness of breath and wheezing. Negative for hemoptysis.   Cardiovascular: Negative for palpitations, orthopnea and leg swelling.  Gastrointestinal: Negative for abdominal pain, constipation, diarrhea, heartburn, nausea and vomiting.  Genitourinary: Negative for dysuria and hematuria.  Musculoskeletal: Negative for back pain and joint pain.  Skin: Negative for rash.  Neurological: Negative for sensory change, speech change, focal weakness and headaches.  Endo/Heme/Allergies: Does not bruise/bleed easily.  Psychiatric/Behavioral: Negative for depression. The patient is not nervous/anxious.     DRUG ALLERGIES:   Allergies  Allergen Reactions  . Piperacillin Other (See Comments)    Renal failure  . 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 See ID note**PER PT-CAUSED RENAL FAILURE**  . Zosyn [Piperacillin Sod-Tazobactam So] Other (See Comments)    Renal failure    VITALS:  Blood pressure (!) 128/59, pulse 63, temperature 97.9 F (36.6 C), temperature source Oral, resp. rate 18, height 5\' 8"  (1.727 m), weight 111.6 kg, SpO2 98 %.  PHYSICAL EXAMINATION:   Physical Exam  GENERAL:  73 y.o.-year-old patient lying in the bed . Unable to speak in full sentences without shortness of breath EYES: Pupils equal, round, reactive to light and accommodation. No scleral icterus. Extraocular muscles intact.  HEENT: Head atraumatic, normocephalic. Oropharynx  and nasopharynx clear.  NECK:  Supple, no jugular venous distention. No thyroid enlargement, no tenderness.  LUNGS: Scattered wheezing, decreased coarse breath sounds Bilateral good airflow CARDIOVASCULAR: S1, S2 normal. No murmurs, rubs, or gallops.  ABDOMEN: Soft, nontender, nondistended. Bowel sounds present. No organomegaly or mass.  EXTREMITIES: No cyanosis, clubbing or edema b/l.   Bilateral below-knee amputations NEUROLOGIC: Cranial nerves II through XII are intact. No focal Motor or sensory deficits b/l.   PSYCHIATRIC: The patient is alert and oriented x 3.  SKIN: No obvious rash, lesion, or ulcer.   LABORATORY PANEL:   CBC Recent Labs  Lab 10/17/18 0530  WBC 14.7*  HGB 11.2*  HCT 36.9  PLT 96*   ------------------------------------------------------------------------------------------------------------------ Chemistries  Recent Labs  Lab 10/17/18 0530  10/20/18 0244  NA 135   < > 132*  K 4.2   < > 4.1  CL 100   < > 97*  CO2 25   < > 23  GLUCOSE 95   < > 305*  BUN 52*   < > 98*  CREATININE 3.60*   < > 3.98*  CALCIUM 8.5*   < > 8.4*  AST 45*  --   --   ALT 25  --   --   ALKPHOS 67  --   --   BILITOT 0.8  --   --    < > = values in this interval not displayed.   ------------------------------------------------------------------------------------------------------------------  Cardiac Enzymes Recent Labs  Lab 10/17/18 0530  TROPONINI 0.10*   ------------------------------------------------------------------------------------------------------------------  RADIOLOGY:  No results found.   ASSESSMENT AND PLAN:   *Acute bronchitis with acute hypoxic respiratory failure -Discontinue IV Solu-Medrol and switch  to oral steroids - Scheduled Nebulizers - Inhalers -Wean O2 as tolerated Respiratory panel with Metapneumovirus. Wean oxygen as tolerated  *Acute kidney injury over CKD stage III.   Creatinine has worsened Nephrology consult appreciated Off  diuretics Monitor renal function, expecting the creatinine to come down  *Acute on chronic systolic congestive heart failure.  Likely decompensated secondary to bronchitis.   No signs of fluid overload at this time.  Was on IV Lasix initially which was stopped due to acute kidney injury.  *UTI with leukocytosis.  Klebsiella in urine cultures. continue ceftriaxone antibiotic  *Chronic atrial fibrillation.  Continue Coumadin, amiodarone and Coreg  *Diabetes mellitus type 2.  On sliding scale insulin along with Lantus insulin Monitor blood sugars closely to avoid hypoglycemia  All the records are reviewed and case discussed with Care Management/Social Worker Management plans discussed with the patient, family and they are in agreement.  CODE STATUS: Full code  DVT Prophylaxis: SCDs  TOTAL TIME TAKING CARE OF THIS PATIENT: 35 minutes.   POSSIBLE D/C IN 1-2 DAYS, DEPENDING ON CLINICAL CONDITION.  Saundra Shelling M.D on 10/20/2018 at 12:12 PM  Between 7am to 6pm - Pager - [phone]  After 6pm go to [email or web address] - password [email or web address] [facility]  Office  [phone]  CC: Primary care physician; System, Provider Not In  Note: This dictation was prepared with Dragon dictation along with smaller phrase technology. Any transcriptional errors that result from this process are unintentional.

## 2018-10-20 NOTE — Progress Notes (Signed)
[facility]  ROUNDING NOTE   Subjective:  Patient well-known to [place] as we follow her in the office for her underlying chronic disease stage IV. Came in now with increasing shortness of breath, congestion, and cough. Patient did receive intravenous Lasix this admission. Thereafter creatinine began rising and is currently up to 3.98 with a BUN of 98. EGFR low at 11. Diuretics have been stopped. Patient also appears to have UTI with Klebsiella in the urine.  Objective:  Vital signs in last 24 hours:  Temp:  [97.4 F (36.3 C)-97.9 F (36.6 C)] 97.9 F (36.6 C) (02/09 0824) Pulse Rate:  [51-63] 63 (02/09 0824) Resp:  [18] 18 (02/09 0824) BP: (128-134)/(53-67) 128/59 (02/09 0824) SpO2:  [93 %-98 %] 98 % (02/09 0824) Weight:  [111.6 kg] 111.6 kg (02/09 0500)  Weight change: 1.829 kg Filed Weights   10/18/18 0506 10/19/18 0329 10/20/18 0500  Weight: 109.3 kg 109.8 kg 111.6 kg    Intake/Output: I/O last 3 completed shifts: In: 120 [P.O.:120] Out: 1550 [Urine:1550]   Intake/Output this shift:  No intake/output data recorded.  Physical Exam: General: No acute distress  Head: Normocephalic, atraumatic. Moist oral mucosal membranes  Eyes: Anicteric  Neck: Supple, trachea midline  Lungs:  Scattered rhonchi, normal effort  Heart: S1S2 no rubs  Abdomen:  Soft, nontender, bowel sounds present  Extremities: B/L BKA  Neurologic: Awake, alert, following commands  Skin: No lesions       Basic Metabolic Panel: Recent Labs  Lab 10/16/18 0405 10/17/18 0530 10/18/18 0621 10/19/18 0507 10/20/18 0244  NA 135 135 132* 130* 132*  K 4.2 4.2 4.3 4.3 4.1  CL 101 100 96* 96* 97*  CO2 25 25 23 23 23   GLUCOSE 185* 95 265* 309* 305*  BUN 38* 52* 70* 86* 98*  CREATININE 3.01* 3.60* 3.86* 3.97* 3.98*  CALCIUM 8.6* 8.5* 8.4* 8.7* 8.4*    Liver Function Tests: Recent Labs  Lab 10/16/18 0405 10/17/18 0530  AST 36 45*  ALT 22 25  ALKPHOS 84 67  BILITOT 1.3* 0.8  PROT  7.3 7.0  ALBUMIN 4.2 3.7   No results for input(s): LIPASE, AMYLASE in the last 168 hours. No results for input(s): AMMONIA in the last 168 hours.  CBC: Recent Labs  Lab 10/16/18 0405 10/17/18 0530  WBC 16.7* 14.7*  HGB 12.0 11.2*  HCT 39.5 36.9  MCV 82.8 83.5  PLT 103* 96*    Cardiac Enzymes: Recent Labs  Lab 10/16/18 0650 10/16/18 1614 10/17/18 0530  TROPONINI 0.09* 0.12* 0.10*    BNP: Invalid input(s): POCBNP  CBG: Recent Labs  Lab 10/19/18 1655 10/19/18 2111 10/20/18 0822 10/20/18 1204 10/20/18 1406  GLUCAP 311* 314* 345* 320* 332*    Microbiology: Results for orders placed or performed during the hospital encounter of 10/16/18  Respiratory Panel by PCR     Status: Abnormal   Collection Time: 10/16/18  4:05 AM  Result Value Ref Range Status   Adenovirus NOT DETECTED NOT DETECTED Final   Coronavirus 229E NOT DETECTED NOT DETECTED Final    Comment: (NOTE) The Coronavirus on the Respiratory Panel, DOES NOT test for the novel  Coronavirus (2019 nCoV)    Coronavirus HKU1 NOT DETECTED NOT DETECTED Final   Coronavirus NL63 NOT DETECTED NOT DETECTED Final   Coronavirus OC43 NOT DETECTED NOT DETECTED Final   Metapneumovirus DETECTED (A) NOT DETECTED Final   Rhinovirus / Enterovirus NOT DETECTED NOT DETECTED Final   Influenza A NOT DETECTED NOT DETECTED Final  Influenza B NOT DETECTED NOT DETECTED Final   Parainfluenza Virus 1 NOT DETECTED NOT DETECTED Final   Parainfluenza Virus 2 NOT DETECTED NOT DETECTED Final   Parainfluenza Virus 3 NOT DETECTED NOT DETECTED Final   Parainfluenza Virus 4 NOT DETECTED NOT DETECTED Final   Respiratory Syncytial Virus NOT DETECTED NOT DETECTED Final   Bordetella pertussis NOT DETECTED NOT DETECTED Final   Chlamydophila pneumoniae NOT DETECTED NOT DETECTED Final   Mycoplasma pneumoniae NOT DETECTED NOT DETECTED Final    Comment: Performed at [facility], [place] [address], [place], [place] [address]  Urine culture      Status: Abnormal   Collection Time: 10/16/18  4:23 AM  Result Value Ref Range Status   Specimen Description URINE, CLEAN CATCH  Final   Special Requests   Final    NONE Performed at [facility], [address] [place], [place] [address]    Culture >=100,000 COLONIES/mL KLEBSIELLA PNEUMONIAE (A)  Final   Report Status 10/19/2018 FINAL  Final   Organism ID, Bacteria KLEBSIELLA PNEUMONIAE (A)  Final      Susceptibility   Klebsiella pneumoniae - MIC*    AMPICILLIN RESISTANT Resistant     CEFAZOLIN <=4 SENSITIVE Sensitive     CEFTRIAXONE <=1 SENSITIVE Sensitive     CIPROFLOXACIN <=0.25 SENSITIVE Sensitive     GENTAMICIN <=1 SENSITIVE Sensitive     IMIPENEM <=0.25 SENSITIVE Sensitive     NITROFURANTOIN <=16 SENSITIVE Sensitive     TRIMETH/SULFA <=20 SENSITIVE Sensitive     AMPICILLIN/SULBACTAM 4 SENSITIVE Sensitive     PIP/TAZO <=4 SENSITIVE Sensitive     Extended ESBL NEGATIVE Sensitive     * >=100,000 COLONIES/mL KLEBSIELLA PNEUMONIAE  MRSA PCR Screening     Status: Abnormal   Collection Time: 10/16/18  5:13 PM  Result Value Ref Range Status   MRSA by PCR POSITIVE (A) NEGATIVE Final    Comment:        The GeneXpert MRSA Assay (FDA approved for NASAL specimens only), is one component of a comprehensive MRSA colonization surveillance program. It is not intended to diagnose MRSA infection nor to guide or monitor treatment for MRSA infections. RESULT CALLED TO, READ BACK BY AND VERIFIED WITH: MAI QUIARORO 10/16/18 @ 1838  [facility] Performed at [facility], [address] [place], [place] [address]     Coagulation Studies: Recent Labs    10/18/18 3212 10/19/18 0507 10/20/18 0244  LABPROT 38.1* 37.0* 35.7*  INR 3.96 3.82 3.64    Urinalysis: No results for input(s): COLORURINE, LABSPEC, PHURINE, GLUCOSEU, HGBUR, BILIRUBINUR, KETONESUR, PROTEINUR, UROBILINOGEN, NITRITE, LEUKOCYTESUR in the last 72 hours.  Invalid input(s): APPERANCEUR    Imaging: No  results found.   Medications:   . sodium chloride 500 mL (10/20/18 0652)  . cefTRIAXone (ROCEPHIN)  IV 1 g (10/20/18 0653)   . albuterol  2.5 mg Nebulization TID  . amiodarone  400 mg Oral BID  . amitriptyline  25 mg Oral QHS  . amLODipine  10 mg Oral Daily  . atorvastatin  40 mg Oral QHS  . budesonide (PULMICORT) nebulizer solution  0.5 mg Nebulization BID  . carvedilol  3.125 mg Oral Daily  . Chlorhexidine Gluconate Cloth  6 each Topical Q0600  . gabapentin  100 mg Oral BID  . Gerhardt's butt cream   Topical BID  . insulin aspart  0-15 Units Subcutaneous TID WC  . insulin aspart  0-5 Units Subcutaneous QHS  . insulin glargine  20 Units  Subcutaneous Daily  . isosorbide mononitrate  30 mg Oral Daily  . methylPREDNISolone (SOLU-MEDROL) injection  60 mg Intravenous BID  . mupirocin ointment  1 application Nasal BID  . pantoprazole  40 mg Oral QHS  . sertraline  100 mg Oral QHS  . sodium chloride flush  3 mL Intravenous Q12H  . Warfarin - Pharmacist Dosing Inpatient   Does not apply q1800   sodium chloride, acetaminophen **OR** acetaminophen, guaiFENesin-dextromethorphan, hydrALAZINE, hydrOXYzine, ipratropium, nitroGLYCERIN, ondansetron **OR** ondansetron (ZOFRAN) IV, traMADol  Assessment/ Plan:  73 y.o. female with atrial fibrillation, hypertension, diabetes mellitus type II insulin dependent, diabetic neuropathy, congestive heart failure, coronary artery disease status post CABG, GERD, CLL, bilateral below the knee amputations admitted with cough, shortness of breath, UTI and now with acute renal failure with superimposed chronic kidney disease stage IV.  1.  Acute renal failure/chronic kidney disease stage IV/diabetes mellitus type 2 with chronic kidney disease.  The patient's baseline creatinine appears to be 2.9 with an EGFR of 16.  Upon presentation now creatinine was 3.01 but has worsened up to 3.98 with a BUN of 98.  Suspect that UTI as well as overdiuresis playing some role  now.  Obtain renal ultrasound to make sure there is no underlying obstruction.  We may need to consider temporary dialysis if renal function does not improve.  This was explained to the patient.  She verbalized understanding of this.  For now continue gentle IV fluid hydration.  2.  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  Hemoglobin currently 11.2.  No acute indication for Epogen at the moment.  3.  Urinary tract infection.  Treatment as per hospitalist.  4.  History of bladder mass.  Patient was to have had urology evaluation for this issue previously.  LOS: 4 Janecia Palau 2/9/20202:33 PM

## 2018-10-21 ENCOUNTER — Other Ambulatory Visit (INDEPENDENT_AMBULATORY_CARE_PROVIDER_SITE_OTHER): Payer: Self-pay | Admitting: Vascular Surgery

## 2018-10-21 DIAGNOSIS — I120 Hypertensive chronic kidney disease with stage 5 chronic kidney disease or end stage renal disease: Secondary | ICD-10-CM

## 2018-10-21 LAB — CBC
HCT: 36.7 % (ref 36.0–46.0)
Hemoglobin: 11.4 g/dL — ABNORMAL LOW (ref 12.0–15.0)
MCH: 25.2 pg — AB (ref 26.0–34.0)
MCHC: 31.1 g/dL (ref 30.0–36.0)
MCV: 81 fL (ref 80.0–100.0)
Platelets: 123 10*3/uL — ABNORMAL LOW (ref 150–400)
RBC: 4.53 MIL/uL (ref 3.87–5.11)
RDW: 18.4 % — ABNORMAL HIGH (ref 11.5–15.5)
WBC: 31 10*3/uL — ABNORMAL HIGH (ref 4.0–10.5)
nRBC: 0 % (ref 0.0–0.2)

## 2018-10-21 LAB — GLUCOSE, CAPILLARY
Glucose-Capillary: 316 mg/dL — ABNORMAL HIGH (ref 70–99)
Glucose-Capillary: 324 mg/dL — ABNORMAL HIGH (ref 70–99)
Glucose-Capillary: 276 mg/dL — ABNORMAL HIGH (ref 70–99)
Glucose-Capillary: 302 mg/dL — ABNORMAL HIGH (ref 70–99)

## 2018-10-21 LAB — BASIC METABOLIC PANEL
Anion gap: 11 (ref 5–15)
BUN: 111 mg/dL — ABNORMAL HIGH (ref 8–23)
CO2: 23 mmol/L (ref 22–32)
Calcium: 8.4 mg/dL — ABNORMAL LOW (ref 8.9–10.3)
Chloride: 97 mmol/L — ABNORMAL LOW (ref 98–111)
Creatinine, Ser: 4.27 mg/dL — ABNORMAL HIGH (ref 0.44–1.00)
GFR calc Af Amer: 11 mL/min — ABNORMAL LOW (ref >60)
GFR calc non Af Amer: 10 mL/min — ABNORMAL LOW (ref >60)
Glucose, Bld: 310 mg/dL — ABNORMAL HIGH (ref 70–99)
Potassium: 4 mmol/L (ref 3.5–5.1)
Sodium: 131 mmol/L — ABNORMAL LOW (ref 135–145)

## 2018-10-21 LAB — PROTIME-INR
INR: 2.14
Prothrombin Time: 23.6 seconds — ABNORMAL HIGH (ref 11.4–15.2)

## 2018-10-21 MED ORDER — PHYTONADIONE 5 MG PO TABS
5.0000 mg | ORAL_TABLET | Freq: Once | ORAL | Status: AC
  Administered 2018-10-21: 5 mg via ORAL
  Filled 2018-10-21: qty 1

## 2018-10-21 MED ORDER — INSULIN GLARGINE 100 UNIT/ML ~~LOC~~ SOLN
20.0000 [IU] | Freq: Two times a day (BID) | SUBCUTANEOUS | Status: DC
  Administered 2018-10-21 – 2018-10-24 (×6): 20 [IU] via SUBCUTANEOUS
  Filled 2018-10-21 (×9): qty 0.2

## 2018-10-21 MED ORDER — CEPHALEXIN 250 MG PO CAPS
250.0000 mg | ORAL_CAPSULE | Freq: Two times a day (BID) | ORAL | Status: AC
  Administered 2018-10-21 – 2018-10-23 (×4): 250 mg via ORAL
  Filled 2018-10-21 (×4): qty 1

## 2018-10-21 MED ORDER — POLYETHYLENE GLYCOL 3350 17 G PO PACK
17.0000 g | PACK | Freq: Once | ORAL | Status: AC
  Administered 2018-10-21: 17 g via ORAL
  Filled 2018-10-21: qty 1

## 2018-10-21 MED ORDER — INSULIN ASPART 100 UNIT/ML ~~LOC~~ SOLN
5.0000 [IU] | Freq: Three times a day (TID) | SUBCUTANEOUS | Status: DC
  Administered 2018-10-21 – 2018-10-31 (×17): 5 [IU] via SUBCUTANEOUS
  Filled 2018-10-21 (×17): qty 1

## 2018-10-21 MED ORDER — WARFARIN SODIUM 4 MG PO TABS
4.0000 mg | ORAL_TABLET | Freq: Once | ORAL | Status: DC
  Filled 2018-10-21: qty 1

## 2018-10-21 MED ORDER — HEPARIN (PORCINE) 25000 UT/250ML-% IV SOLN
1000.0000 [IU]/h | INTRAVENOUS | Status: DC
  Administered 2018-10-22 – 2018-10-23 (×2): 1200 [IU]/h via INTRAVENOUS
  Filled 2018-10-21 (×2): qty 250

## 2018-10-21 MED ORDER — CLINDAMYCIN PHOSPHATE 300 MG/50ML IV SOLN
300.0000 mg | Freq: Once | INTRAVENOUS | Status: AC
  Administered 2018-10-21: 300 mg via INTRAVENOUS
  Administered 2018-10-22: 20:00:00 via INTRAVENOUS
  Filled 2018-10-21: qty 50

## 2018-10-21 MED ORDER — PREDNISONE 20 MG PO TABS
40.0000 mg | ORAL_TABLET | Freq: Every day | ORAL | Status: DC
  Administered 2018-10-22 – 2018-10-23 (×2): 40 mg via ORAL
  Filled 2018-10-21 (×2): qty 2

## 2018-10-21 MED ORDER — CIPROFLOXACIN HCL 500 MG PO TABS
250.0000 mg | ORAL_TABLET | Freq: Every day | ORAL | Status: DC
  Administered 2018-10-21: 250 mg via ORAL
  Filled 2018-10-21: qty 0.5

## 2018-10-21 MED ORDER — LACTULOSE 10 GM/15ML PO SOLN
20.0000 g | Freq: Two times a day (BID) | ORAL | Status: DC
  Administered 2018-10-21 – 2018-10-29 (×12): 20 g via ORAL
  Filled 2018-10-21 (×18): qty 30

## 2018-10-21 MED ORDER — SENNA 8.6 MG PO TABS
1.0000 | ORAL_TABLET | Freq: Every day | ORAL | Status: DC
  Administered 2018-10-21 – 2018-10-30 (×9): 8.6 mg via ORAL
  Filled 2018-10-21 (×10): qty 1

## 2018-10-21 NOTE — Plan of Care (Signed)
  Problem: Activity: Goal: Capacity to carry out activities will improve Outcome: Progressing   Problem: Education: Goal: Ability to demonstrate management of disease process will improve Outcome: Not Progressing   Problem: Cardiac: Goal: Ability to achieve and maintain adequate cardiopulmonary perfusion will improve Outcome: Not Progressing  Patient to get hemodialysis catheter for worsening renal function

## 2018-10-21 NOTE — Progress Notes (Deleted)
Cardiology Office Note Date:  10/21/2018  Patient ID:  Caroline Hernandez, Caroline Hernandez 07/24/1946, MRN [id number] PCP:  System, Provider Not In  Cardiologist:  Dr. Rockey Situ, MD Electrophysiologist: Dr. Caryl Comes, MD  ***refresh   Chief Complaint: ***  History of Present Illness: Caroline Hernandez is a 73 y.o. female with history of CAD with cardiac arrest s/p 3-vessel CABG in 2004 with LIMA to LAD, SVG to OM, and SVG to RCA, chronic combined CHF, ICM s/p ICD, persistent Afib diagnosed in 2016 on Coumadin s/p DCCV in 09/2015 and 04/2016 with Mobitz type I noted on post tracing s/p RFCA in 07/2016 s/p repeat TEE/DCCV 09/2016, pulmonary hypertension, CLL, CKD stage IV, anemia of chronic disease, PAD s/p bilateral BKA, DM2, HTN, HLD, and GERD who presents for  follow-up.  Most recent ischemic evaluation via nuclear stress test in 06/2015 showed no significant ischemia with an old scar noted in the mid to distal anteroseptal and septal region, EF 48%, low risk scan. Most recent echo via TEE in 09/2016 showed an EF of 40-45%, mild MR, mildly dilated LA with mild spontaneous echo contrast in the LAA, large PFO with left to right shunting. She was admitted to the hospital in the summer of 2019 twice with UTI and PNA complicated by GNR bacteremia. She was last seen in the office in 05/2017 and was back in Afib. She was uncertain if she wanted to pursue rhythm control. She was not a candidate for AAD given baseline bradycardia as well as her ischemic heart disease.   Most recent INR from 10/21/2018 of 2.4.   Labs: 04/2018 - K+ 4.3, SCr 2.70, WBC 37.6, HGB 9.6, PLT 163, AST/ALT normal, albumin 3.7 02/2018 - A1c 9.1  Patient comes in accompanied by her daughter today.  She reports up until the past week and 1/2 to 2 weeks she had been doing quite well.  However approximately 2 weeks prior she began to develop upper leg edema (patient is status post bilateral BKA), stump edema, abdominal distention, shortness of breath, and weight  gain.  When patient was last evaluated in our office in 05/2017 she was noted to weigh 211 pounds.  She was seen by [facility] in 08/2018 and reported a weight of 214 pounds.  Since then, she has trended to a weight of 230 pounds by her home scale today and in our office today.  She denies any dietary changes and has been compliant with her medications, taking Lasix 40 mg twice daily and occasionally 3 times daily.  She intermittently notes some tachypalpitations.  No chest pain, orthopnea, dizziness, presyncope, or syncope.  She does note some early satiety.  No shocks from her device.  She is interested in pursuing repeat cardioversion when able.   Past Medical History:  Diagnosis Date  . Amputation of left lower extremity below knee ([place])   . Amputation of right lower extremity below knee ([place])   . CAD (coronary artery disease)    a. 2004 Cardiac Arrest/CABG x 3 (LIMA->LAD, VG->OM, VG->RCA);  b. 06/2015 lexiscan MV: no significant ischemia, EF 48%, low risk->Med Rx.  . Chronic combined systolic and diastolic CHF (congestive heart failure) ([place])    a. 12/2014 Echo: EF 45-50%;  b. 08/2015 Echo: EF 45-50%, ant, antsept HK, mildly dil LA, nl RV, mild-mod TR, sev PAH (58mmHg); b. 08/2017 TEE: EF 40-45%, large PFO w/ L->R shunting.  . CKD (chronic kidney disease), stage IV ([place])   . CLL (chronic lymphocytic leukemia) ([place])   .  Diabetes mellitus without complication ([place])   . Essential hypertension   . GERD (gastroesophageal reflux disease)   . Hiatal hernia   . History of cardiac arrest    a. 2004.  Marland Kitchen Hyperlipidemia   . Ischemic cardiomyopathy    a. s/p MDT ICD (originally had 8938 lead-->gen change and lead revision ~ 2012 @ [place]); b. 12/2014 Echo: EF 45-50%;  c.08/2015 Echo: EF 45-50%; d. 08/2017 Echo: EF 40-45%.  Marland Kitchen PAD (peripheral artery disease) (HCC)    a. s/p bilat BKA  . Persistent atrial fibrillation    a. Dx 12/2014.  CHA2DS2VASc = 6--> warfarin;  b.  09/2015 s/p DCCV-->on amio; c. s/p DCCV 04/25/16; d. 07/2016 s/p RFCA/PVI in setting of recurrent afib despite amio; e. 05/2018 Recurrent afib.    Past Surgical History:  Procedure Laterality Date  . ABDOMINAL HYSTERECTOMY    . Amputation lower extremity bilaterally Bilateral   . CARDIAC CATHETERIZATION    . CARDIOVERSION N/A 10/09/2016   Procedure: CARDIOVERSION;  Surgeon: Lelon Perla, MD;  [facility] ENDOSCOPY;  Service: Cardiovascular;  Laterality: N/A;  . CORONARY ARTERY BYPASS GRAFT    . ELECTROPHYSIOLOGIC STUDY N/A 10/07/2015   Procedure: CARDIOVERSION;  Surgeon: Minna Merritts, MD;  Location: [facility] ORS;  Service: Cardiovascular;  Laterality: N/A;  . ELECTROPHYSIOLOGIC STUDY N/A 04/25/2016   Procedure: Cardioversion;  Surgeon: Minna Merritts, MD;  Location: [facility] ORS;  Service: Cardiovascular;  Laterality: N/A;  . ELECTROPHYSIOLOGIC STUDY N/A 07/18/2016   Procedure: Atrial Fibrillation Ablation;  Surgeon: Thompson Grayer, MD;  Location: [place] CV LAB;  Service: Cardiovascular;  Laterality: N/A;  . IMPLANTABLE CARDIOVERTER DEFIBRILLATOR IMPLANT  2005   [employer]   . TEE WITHOUT CARDIOVERSION N/A 07/18/2016   Procedure: TRANSESOPHAGEAL ECHOCARDIOGRAM (TEE);  Surgeon: Sueanne Margarita, MD;  Location[facility] ENDOSCOPY;  Service: Cardiovascular;  Laterality: N/A;  . TEE WITHOUT CARDIOVERSION N/A 10/09/2016   Procedure: TRANSESOPHAGEAL ECHOCARDIOGRAM (TEE);  Surgeon: Lelon Perla, MD;  Location: [facility] ENDOSCOPY;  Service: Cardiovascular;  Laterality: N/A;    No outpatient medications have been marked as taking for the 10/22/18 encounter (Appointment) with Rise Mu, PA-C.    Allergies:   Piperacillin; Piperacillin-tazobactam in dex; and Zosyn [piperacillin sod-tazobactam so]   Social History:  The patient  reports that she has never smoked. She has never used smokeless tobacco. She reports that she does not drink alcohol or use drugs.   Family History:  The patient's family history  includes Atrial fibrillation in her mother; CAD in her mother; Diabetes in her father and mother; Lung cancer in her father.  ROS:   ROS   PHYSICAL EXAM: *** VS:  There were no vitals taken for this visit. BMI: There is no height or weight on file to calculate BMI.  Physical Exam   EKG:  Was ordered and interpreted by me today. Shows ***  Recent Labs: 10/16/2018: B Natriuretic Peptide 1,501.0 10/17/2018: ALT 25; Hemoglobin 11.2; Platelets 96 10/20/2018: BUN 98; Creatinine, Ser 3.98; Potassium 4.1; Sodium 132  No results found for requested labs within last 8760 hours.   Estimated Creatinine Clearance: 17.3 mL/min (A) (by C-G formula based on SCr of 3.98 mg/dL (H)).   Wt Readings from Last 3 Encounters:  10/21/18 247 lb 2.2 oz (112.1 kg)  10/15/18 238 lb (108 kg)  10/07/18 227 lb 12.8 oz (103.3 kg)     Other studies reviewed: Additional studies/records reviewed today include: summarized above  ASSESSMENT AND PLAN:  1. ***  Disposition: F/u  with Dr. Rockey Situ or an APP in ***. Follow up with EP as directed.   Current medicines are reviewed at length with the patient today.  The patient did not have any concerns regarding medicines.  Signed, Christell Faith, PA-C 10/21/2018 8:37 AM     [place] [address] [place] [address] [place],  [address] [phone]

## 2018-10-21 NOTE — Progress Notes (Signed)
[place] [facility] [place] NAME: Caroline Hernandez    MR#:  [id number]  DATE OF BIRTH:  31-Jul-1946  SUBJECTIVE:  Patient seen and evaluated today Has decreased cough No fever No chest pain On oxygen via nasal cannula at 2 L  REVIEW OF SYSTEMS:    Review of Systems  Constitutional: Positive for malaise/fatigue. Negative for chills and fever.  HENT: Negative for sore throat.   Eyes: Negative for blurred vision, double vision and pain.  Respiratory: Positive for cough, shortness of breath and wheezing. Negative for hemoptysis.   Cardiovascular: Negative for palpitations, orthopnea and leg swelling.  Gastrointestinal: Negative for abdominal pain, constipation, diarrhea, heartburn, nausea and vomiting.  Genitourinary: Negative for dysuria and hematuria.  Musculoskeletal: Negative for back pain and joint pain.  Skin: Negative for rash.  Neurological: Negative for sensory change, speech change, focal weakness and headaches.  Endo/Heme/Allergies: Does not bruise/bleed easily.  Psychiatric/Behavioral: Negative for depression. The patient is not nervous/anxious.     DRUG ALLERGIES:   Allergies  Allergen Reactions  . Piperacillin Other (See Comments)    Renal failure  . 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 See ID note**PER PT-CAUSED RENAL FAILURE**  . Zosyn [Piperacillin Sod-Tazobactam So] Other (See Comments)    Renal failure    VITALS:  Blood pressure (!) 151/61, pulse 64, temperature 98 F (36.7 C), temperature source Oral, resp. rate 16, height 5\' 8"  (1.727 m), weight 112.1 kg, SpO2 95 %.  PHYSICAL EXAMINATION:   Physical Exam  GENERAL:  73 y.o.-year-old patient lying in the bed . Unable to speak in full sentences without shortness of breath EYES: Pupils equal, round, reactive to light and accommodation. No scleral icterus. Extraocular muscles intact.  HEENT: Head atraumatic, normocephalic.  Oropharynx and nasopharynx clear.  NECK:  Supple, no jugular venous distention. No thyroid enlargement, no tenderness.  LUNGS: Scattered wheezing, decreased coarse breath sounds Bilateral good airflow CARDIOVASCULAR: S1, S2 normal. No murmurs, rubs, or gallops.  ABDOMEN: Soft, nontender, nondistended. Bowel sounds present. No organomegaly or mass.  EXTREMITIES: No cyanosis, clubbing or edema b/l.   Bilateral below-knee amputations NEUROLOGIC: Cranial nerves II through XII are intact. No focal Motor or sensory deficits b/l.   PSYCHIATRIC: The patient is alert and oriented x 3.  SKIN: No obvious rash, lesion, or ulcer.   LABORATORY PANEL:   CBC Recent Labs  Lab 10/17/18 0530  WBC 14.7*  HGB 11.2*  HCT 36.9  PLT 96*   ------------------------------------------------------------------------------------------------------------------ Chemistries  Recent Labs  Lab 10/17/18 0530  10/21/18 0426  NA 135   < > 131*  K 4.2   < > 4.0  CL 100   < > 97*  CO2 25   < > 23  GLUCOSE 95   < > 310*  BUN 52*   < > 111*  CREATININE 3.60*   < > 4.27*  CALCIUM 8.5*   < > 8.4*  AST 45*  --   --   ALT 25  --   --   ALKPHOS 67  --   --   BILITOT 0.8  --   --    < > = values in this interval not displayed.   ------------------------------------------------------------------------------------------------------------------  Cardiac Enzymes Recent Labs  Lab 10/17/18 0530  TROPONINI 0.10*   ------------------------------------------------------------------------------------------------------------------  RADIOLOGY:  US Renal  Result Date: 10/20/2018 CLINICAL DATA:  Acute renal failure EXAM: RENAL / URINARY TRACT ULTRASOUND COMPLETE COMPARISON:  None.  FINDINGS: Right Kidney: Renal measurements: 8.9 x 4.4 x 5.5 cm = volume: 113 mL . Echogenicity within normal limits. No mass or hydronephrosis visualized. Left Kidney: Renal measurements: 11.4 x 7.2 x 5.7 cm = volume: 250 mL. Echogenicity within  normal limits. No mass or hydronephrosis visualized. Bladder: Poorly visualized/decompressed by indwelling Foley catheter. IMPRESSION: No hydronephrosis. Bladder decompressed by indwelling Foley catheter. Electronically Signed   By: Julian Hy M.D.   On: 10/20/2018 17:19     ASSESSMENT AND PLAN:   *Acute bronchitis with acute hypoxic respiratory failure - oral steroids - Scheduled Nebulizers - Inhalers -Wean O2 as tolerated Respiratory panel with Metapneumovirus. Wean oxygen as tolerated  *Acute kidney injury over CKD stage III.   Creatinine has worsened 4.27 today Nephrology f/u Off diuretics Monitor renal function  *Acute on chronic systolic congestive heart failure.  Likely decompensated secondary to bronchitis.   No signs of fluid overload at this time.  Was on IV Lasix initially which was stopped due to acute kidney injury.  *UTI with leukocytosis.  Klebsiella in urine cultures.  Discontinue ceftriaxone and start oral keflex abx  *Chronic atrial fibrillation.  Continue Coumadin, amiodarone and Coreg  *Diabetes mellitus type 2.  On sliding scale insulin along with Lantus insulin Monitor blood sugars closely to avoid hypoglycemia  All the records are reviewed and case discussed with Care Management/Social Worker Management plans discussed with the patient, family and they are in agreement.  CODE STATUS: Full code  DVT Prophylaxis: SCDs  TOTAL TIME TAKING CARE OF THIS PATIENT: 36 minutes.   POSSIBLE D/C IN 1-2 DAYS, DEPENDING ON CLINICAL CONDITION.  Saundra Shelling M.D on 10/21/2018 at 11:39 AM  Between 7am to 6pm - Pager - [phone]  After 6pm go to [email or web address] - password [email or web address] [facility]  Office  [phone]  CC: Primary care physician; System, Provider Not In  Note: This dictation was prepared with Dragon dictation along with smaller phrase technology. Any transcriptional errors that result from this process are  unintentional.

## 2018-10-21 NOTE — Progress Notes (Signed)
Inpatient Diabetes Program Recommendations  AACE/ADA: New Consensus Statement on Inpatient Glycemic Control (2015)  Target Ranges:  Prepandial:   less than 140 mg/dL      Peak postprandial:   less than 180 mg/dL (1-2 hours)      Critically ill patients:  140 - 180 mg/dL   Results for Caroline Hernandez, Caroline Hernandez (MRN [id number]) as of 10/21/2018 10:30  Ref. Range 10/20/2018 08:22 10/20/2018 12:04 10/20/2018 14:06 10/20/2018 16:24 10/20/2018 20:47 10/20/2018 22:32  Glucose-Capillary Latest Ref Range: 70 - 99 mg/dL 345 (H)  11 units NOVOLOG +  20 units LANTUS  320 (H) 332 (H)  11 units NOVOLOG  316 (H)  11 units NOVOLOG  301 (H) 345 (H)  5 units NOVOLOG    Results for Caroline Hernandez, Caroline Hernandez (MRN [id number]) as of 10/21/2018 10:30  Ref. Range 10/21/2018 08:47  Glucose-Capillary Latest Ref Range: 70 - 99 mg/dL 316 (H)    Home DM Meds: Lantus 38 units QAM/ 14 units QPM       Novolog 10 units with breakfast, Novolog 5 units with lunch, Novolog 10 units with supper   Current Orders: Lantus 20 units Daily      Novolog Moderate Correction Scale/ SSI (0-15 units) TID AC + HS     Getting Solumedrol 60 mg BID.  CBGs >300 mg/dl.     MD- Please consider the following in-hospital insulin adjustments:  1. Increase Lantus to 20 units BID (80% total home dose-takes total of 52 units at home)  2. Start Novolog Meal Coverage: Novolog 5 units TID with meals  (Please add the following Hold Parameters: Hold if pt eats <50% of meal, Hold if pt NPO)     --Will follow patient during hospitalization--  Wyn Quaker RN, MSN, CDE Diabetes Coordinator Inpatient Glycemic Control Team Team Pager: [phone] (8a-5p)

## 2018-10-21 NOTE — Consult Note (Signed)
ANTICOAGULATION CONSULT NOTE - Initial Consult  Pharmacy Consult for Heparin infusion Indication: atrial fibrillation  Allergies  Allergen Reactions  . Piperacillin Other (See Comments)    Renal failure  . 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 See ID note**PER PT-CAUSED RENAL FAILURE**  . Zosyn [Piperacillin Sod-Tazobactam So] Other (See Comments)    Renal failure    Patient Measurements: Height: 5\' 8"  (172.7 cm) Weight: 247 lb 2.2 oz (112.1 kg) IBW/kg (Calculated) : 63.9 Heparin Dosing Weight: 84.8 kg  Vital Signs: Temp: 98 F (36.7 C) (02/10 0846) Temp Source: Oral (02/10 0846) BP: 151/61 (02/10 0846) Pulse Rate: 64 (02/10 0846)  Labs: Recent Labs    10/19/18 0507 10/20/18 0244 10/21/18 0426  LABPROT 37.0* 35.7* 23.6*  INR 3.82 3.64 2.14  CREATININE 3.97* 3.98* 4.27*    Estimated Creatinine Clearance: 16.1 mL/min (A) (by C-G formula based on SCr of 4.27 mg/dL (H)).   Medical History: Past Medical History:  Diagnosis Date  . Amputation of left lower extremity below knee ([place])   . Amputation of right lower extremity below knee ([place])   . CAD (coronary artery disease)    a. 2004 Cardiac Arrest/CABG x 3 (LIMA->LAD, VG->OM, VG->RCA);  b. 06/2015 lexiscan MV: no significant ischemia, EF 48%, low risk->Med Rx.  . Chronic combined systolic and diastolic CHF (congestive heart failure) ([place])    a. 12/2014 Echo: EF 45-50%;  b. 08/2015 Echo: EF 45-50%, ant, antsept HK, mildly dil LA, nl RV, mild-mod TR, sev PAH (54mmHg); b. 08/2017 TEE: EF 40-45%, large PFO w/ L->R shunting.  . CKD (chronic kidney disease), stage IV ([place])   . CLL (chronic lymphocytic leukemia) ([place])   . Diabetes mellitus without complication ([place])   . Essential hypertension   . GERD (gastroesophageal reflux disease)   . Hiatal hernia   . History of cardiac arrest    a. 2004.  Marland Kitchen Hyperlipidemia   . Ischemic cardiomyopathy    a. s/p MDT ICD (originally had 8299  lead-->gen change and lead revision ~ 2012 @ [place]); b. 12/2014 Echo: EF 45-50%;  c.08/2015 Echo: EF 45-50%; d. 08/2017 Echo: EF 40-45%.  Marland Kitchen PAD (peripheral artery disease) (HCC)    a. s/p bilat BKA  . Persistent atrial fibrillation    a. Dx 12/2014.  CHA2DS2VASc = 6--> warfarin;  b. 09/2015 s/p DCCV-->on amio; c. s/p DCCV 04/25/16; d. 07/2016 s/p RFCA/PVI in setting of recurrent afib despite amio; e. 05/2018 Recurrent afib.    Medications:  Scheduled:  . albuterol  2.5 mg Nebulization TID  . amiodarone  400 mg Oral BID  . amitriptyline  25 mg Oral QHS  . amLODipine  10 mg Oral Daily  . atorvastatin  40 mg Oral QHS  . budesonide (PULMICORT) nebulizer solution  0.5 mg Nebulization BID  . carvedilol  3.125 mg Oral Daily  . cephALEXin  250 mg Oral Q12H  . Chlorhexidine Gluconate Cloth  6 each Topical Q0600  . gabapentin  100 mg Oral BID  . Gerhardt's butt cream   Topical BID  . insulin aspart  0-15 Units Subcutaneous TID WC  . insulin aspart  0-5 Units Subcutaneous QHS  . insulin aspart  5 Units Subcutaneous TID WC  . insulin glargine  20 Units Subcutaneous BID  . isosorbide mononitrate  30 mg Oral Daily  . lactulose  20 g Oral BID  . mupirocin ointment  1 application Nasal BID  . pantoprazole  40 mg Oral QHS  .  phytonadione  5 mg Oral Once  . [START ON 10/22/2018] predniSONE  40 mg Oral Q breakfast  . senna  1 tablet Oral Daily  . sertraline  100 mg Oral QHS  . sodium chloride flush  3 mL Intravenous Q12H   Infusions:  . sodium chloride 500 mL (10/21/18 2297)    Assessment: Patient previously being anticoagulated with warfarin.  Patient now being transitioned to heparin infusion due to planned procedures of Permcath placement and peritoneal dialysis access.  Due to this vascular surgery is planning to give patient Vitamin K 5 mg PO once for warfarin reversal with INR 2.14.  Per discussion with Marcelle Overlie, PA-C will start heparin drip without bolus.  Typical transition would be  initiated when INR is a lower limit of goal range without bolus and dosed per indication.  Goal of Therapy:  Heparin level 0.3-0.7 units/ml Monitor platelets by anticoagulation protocol: Yes   Plan:  Start heparin infusion at 1200 units/hr Check heparin level every 8 hours until two consecutive therapeutic levels then daily and CBC daily while on heparin Continue to monitor H&H and platelets  Forrest Moron, PharmD Clinical Pharmacist 10/21/2018,4:30 PM

## 2018-10-21 NOTE — Progress Notes (Addendum)
ANTICOAGULATION CONSULT NOTE -   Pharmacy Consult for Warfarin Indication: atrial fibrillation  Allergies  Allergen Reactions  . Piperacillin Other (See Comments)    Renal failure  . 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 See ID note**PER PT-CAUSED RENAL FAILURE**  . Zosyn [Piperacillin Sod-Tazobactam So] Other (See Comments)    Renal failure    Patient Measurements: Height: 5\' 8"  (172.7 cm) Weight: 247 lb 2.2 oz (112.1 kg) IBW/kg (Calculated) : 63.9 Heparin Dosing Weight:    Vital Signs: Temp: 97.9 F (36.6 C) (02/10 0607) Temp Source: Oral (02/10 0607) BP: 155/55 (02/10 0607) Pulse Rate: 63 (02/10 0607)  Labs: Recent Labs    10/19/18 0507 10/20/18 0244 10/21/18 0426  LABPROT 37.0* 35.7* 23.6*  INR 3.82 3.64 2.14  CREATININE 3.97* 3.98*  --     Estimated Creatinine Clearance: 17.3 mL/min (A) (by C-G formula based on SCr of 3.98 mg/dL (H)).   Medical History: Past Medical History:  Diagnosis Date  . Amputation of left lower extremity below knee ([place])   . Amputation of right lower extremity below knee ([place])   . CAD (coronary artery disease)    a. 2004 Cardiac Arrest/CABG x 3 (LIMA->LAD, VG->OM, VG->RCA);  b. 06/2015 lexiscan MV: no significant ischemia, EF 48%, low risk->Med Rx.  . Chronic combined systolic and diastolic CHF (congestive heart failure) ([place])    a. 12/2014 Echo: EF 45-50%;  b. 08/2015 Echo: EF 45-50%, ant, antsept HK, mildly dil LA, nl RV, mild-mod TR, sev PAH (48mmHg); b. 08/2017 TEE: EF 40-45%, large PFO w/ L->R shunting.  . CKD (chronic kidney disease), stage IV ([place])   . CLL (chronic lymphocytic leukemia) ([place])   . Diabetes mellitus without complication ([place])   . Essential hypertension   . GERD (gastroesophageal reflux disease)   . Hiatal hernia   . History of cardiac arrest    a. 2004.  Marland Kitchen Hyperlipidemia   . Ischemic cardiomyopathy    a. s/p MDT ICD (originally had 1655 lead-->gen change and lead  revision ~ 2012 @ [place]); b. 12/2014 Echo: EF 45-50%;  c.08/2015 Echo: EF 45-50%; d. 08/2017 Echo: EF 40-45%.  Marland Kitchen PAD (peripheral artery disease) (HCC)    a. s/p bilat BKA  . Persistent atrial fibrillation    a. Dx 12/2014.  CHA2DS2VASc = 6--> warfarin;  b. 09/2015 s/p DCCV-->on amio; c. s/p DCCV 04/25/16; d. 07/2016 s/p RFCA/PVI in setting of recurrent afib despite amio; e. 05/2018 Recurrent afib.    Assessment: Patient is a 73yo female admitted for SOB. Pharmacy consulted for management of Warfarin for afib.   WARFARIN COURSE DATE INR DOSE 2/5 2.65 4 mg 2/6 3.13 HELD 2/7 3.96     HELD 2/8       3.82     HELD 2/9       3.64     HELD 2/10     2.14   Goal of Therapy:  INR 2-3   Plan:  INR therapeutic @ 2.14 - Continue home dose 4mg  this evening and recheck INR/CBC with am labs   Lu Duffel, PharmD, BCPS Clinical Pharmacist 10/21/2018 7:30 AM

## 2018-10-21 NOTE — Consult Note (Signed)
[facility] Vascular Consult Note  MRN : [id number]  Caroline Hernandez is a 73 y.o. ([birth year]) female who presents with chief complaint of weakness  History of Present Illness:  The patient is a 73 year old female with past medical history of congestive heart failure, acute on chronic renal failure, coronary artery disease, chronic A. Fib, essential hypertension, peripheral artery disease status post bilateral BKA, hyperlipidemia, DM, GERD, CLL presented to the [facility] emergency department with a chief complaint of weakness.  The patient's symptoms have resolved since admission however her kidney function continues to decline.  At this time, the patient denies any shortness of breath or chest pain.  Patient's weakness has improved. Patient denies any uremic symptoms.  Patient denies any fever, nausea vomiting.  The patient has a known history of chronic kidney disease.  The patient's kidney function continues to decline and nephrology feels that she may have progressed to end-stage renal disease.  Nephrology has discussed dialysis with the patient.  At this time, the patient does not have an appropriate access to dialyze from.  Vascular surgery was consulted by Dr. Juleen China Permcath placement and peritoneal dialysis catheter placement.  Current Facility-Administered Medications  Medication Dose Route Frequency Provider Last Rate Last Dose  . 0.9 %  sodium chloride infusion   Intravenous PRN Saundra Shelling, MD 10 mL/hr at 10/21/18 0605 500 mL at 10/21/18 0605  . acetaminophen (TYLENOL) tablet 650 mg  650 mg Oral Q6H PRN Sedalia Muta, MD       Or  . acetaminophen (TYLENOL) suppository 650 mg  650 mg Rectal Q6H PRN Sedalia Muta, MD      . albuterol (PROVENTIL) (2.5 MG/3ML) 0.083% nebulizer solution 2.5 mg  2.5 mg Nebulization TID [name], MD   2.5 mg at 10/21/18 1357  . amiodarone (PACERONE) tablet 400 mg  400 mg Oral BID Sedalia Muta, MD   400 mg at 10/21/18 1024  . amitriptyline (ELAVIL) tablet 25 mg  25 mg Oral QHS Sedalia Muta, MD   25 mg at 10/20/18 2243  . amLODipine (NORVASC) tablet 10 mg  10 mg Oral Daily Loletha Grayer, MD   10 mg at 10/21/18 0932  . atorvastatin (LIPITOR) tablet 40 mg  40 mg Oral QHS Sedalia Muta, MD   40 mg at 10/20/18 2243  . budesonide (PULMICORT) nebulizer solution 0.5 mg  0.5 mg Nebulization BID Loletha Grayer, MD   0.5 mg at 10/21/18 0730  . carvedilol (COREG) tablet 3.125 mg  3.125 mg Oral Daily Sedalia Muta, MD   3.125 mg at 10/21/18 0933  . cephALEXin (KEFLEX) capsule 250 mg  250 mg Oral Q12H Pyreddy, Pavan, MD      . Chlorhexidine Gluconate Cloth 2 % PADS 6 each  6 each Topical Q0600 [name], MD   6 each at 10/21/18 0606  . gabapentin (NEURONTIN) capsule 100 mg  100 mg Oral BID Sedalia Muta, MD   100 mg at 10/21/18 0933  . Gerhardt's butt cream   Topical BID Sudini, Alveta Heimlich, MD      . guaiFENesin-dextromethorphan [facility] DM) 100-10 MG/5ML syrup 5 mL  5 mL Oral Q4H PRN Sedalia Muta, MD   5 mL at 10/21/18 0932  . hydrALAZINE (APRESOLINE) injection 5 mg  5 mg Intravenous Q6H PRN Sedalia Muta, MD      . hydrOXYzine (ATARAX/VISTARIL) tablet 25 mg  25 mg Oral TID PRN [name], MD   25 mg at 10/20/18 2243  . insulin aspart (novoLOG)  injection 0-15 Units  0-15 Units Subcutaneous TID WC Sedalia Muta, MD   11 Units at 10/21/18 1224  . insulin aspart (novoLOG) injection 0-5 Units  0-5 Units Subcutaneous QHS Sedalia Muta, MD   5 Units at 10/20/18 2244  . insulin aspart (novoLOG) injection 5 Units  5 Units Subcutaneous TID WC Pyreddy, Pavan, MD      . insulin glargine (LANTUS) injection 20 Units  20 Units Subcutaneous BID Pyreddy, Pavan, MD      . ipratropium (ATROVENT) nebulizer solution 0.5 mg  0.5 mg Nebulization Q6H PRN Sedalia Muta, MD   0.5 mg at 10/19/18 2338  . isosorbide mononitrate (IMDUR) 24 hr tablet 30 mg  30 mg Oral Daily Sedalia Muta, MD   30 mg at 10/21/18 0932  . lactulose (CHRONULAC) 10 GM/15ML solution 20 g  20 g Oral BID Harrie Foreman, MD   20 g at 10/21/18 0932  . mupirocin ointment (BACTROBAN) 2 % 1 application  1 application Nasal BID [name], MD   1 application at 69/79/48 [phone]  . nitroGLYCERIN (NITROSTAT) SL tablet 0.4 mg  0.4 mg Sublingual Q5 min PRN Sedalia Muta, MD      . ondansetron [facility]) tablet 4 mg  4 mg Oral Q6H PRN Sedalia Muta, MD       Or  . ondansetron [facility]) injection 4 mg  4 mg Intravenous Q6H PRN Sedalia Muta, MD   4 mg at 10/16/18 1334  . pantoprazole (PROTONIX) EC tablet 40 mg  40 mg Oral QHS Sedalia Muta, MD   40 mg at 10/20/18 2243  . phytonadione (VITAMIN K) tablet 5 mg  5 mg Oral Once Lenoir Facchini, Janalyn Harder, PA-C      . [START ON 10/22/2018] predniSONE (DELTASONE) tablet 40 mg  40 mg Oral Q breakfast Pyreddy, Pavan, MD      . senna (SENOKOT) tablet 8.6 mg  1 tablet Oral Daily Harrie Foreman, MD   8.6 mg at 10/21/18 0932  . sertraline (ZOLOFT) tablet 100 mg  100 mg Oral QHS Sedalia Muta, MD   100 mg at 10/20/18 2243  . sodium chloride flush (NS) 0.9 % injection 3 mL  3 mL Intravenous Q12H Sedalia Muta, MD   3 mL at 10/21/18 0933  . traMADol (ULTRAM) tablet 50 mg  50 mg Oral Q12H PRN [name], MD   50 mg at 10/17/18 1312   Past Medical History:  Diagnosis Date  . Amputation of left lower extremity below knee ([place])   . Amputation of right lower extremity below knee ([place])   . CAD (coronary artery disease)    a. 2004 Cardiac Arrest/CABG x 3 (LIMA->LAD, VG->OM, VG->RCA);  b. 06/2015 lexiscan MV: no significant ischemia, EF 48%, low risk->Med Rx.  . Chronic combined systolic and diastolic CHF (congestive heart failure) ([place])    a. 12/2014 Echo: EF 45-50%;  b. 08/2015 Echo: EF 45-50%, ant, antsept HK, mildly dil LA, nl RV, mild-mod TR, sev PAH (40mmHg); b. 08/2017 TEE: EF 40-45%, large PFO w/ L->R shunting.  . CKD (chronic kidney disease), stage IV  ([place])   . CLL (chronic lymphocytic leukemia) ([place])   . Diabetes mellitus without complication ([place])   . Essential hypertension   . GERD (gastroesophageal reflux disease)   . Hiatal hernia   . History of cardiac arrest    a. 2004.  Marland Kitchen Hyperlipidemia   . Ischemic cardiomyopathy    a. s/p MDT ICD (originally had 5374 lead-->gen change and lead revision ~  2012 @ [facility] [place]); b. 12/2014 Echo: EF 45-50%;  c.08/2015 Echo: EF 45-50%; d. 08/2017 Echo: EF 40-45%.  Marland Kitchen PAD (peripheral artery disease) ([facility])    a. s/p bilat BKA  . Persistent atrial fibrillation    a. Dx 12/2014.  CHA2DS2VASc = 6--> warfarin;  b. 09/2015 s/p DCCV-->on amio; c. s/p DCCV 04/25/16; d. 07/2016 s/p RFCA/PVI in setting of recurrent afib despite amio; e. 05/2018 Recurrent afib.   Past Surgical History:  Procedure Laterality Date  . ABDOMINAL HYSTERECTOMY    . Amputation lower extremity bilaterally Bilateral   . CARDIAC CATHETERIZATION    . CARDIOVERSION N/A 10/09/2016   Procedure: CARDIOVERSION;  Surgeon: Lelon Perla, MD;  Location: [facility] ENDOSCOPY;  Service: Cardiovascular;  Laterality: N/A;  . CORONARY ARTERY BYPASS GRAFT    . ELECTROPHYSIOLOGIC STUDY N/A 10/07/2015   Procedure: CARDIOVERSION;  Surgeon: Minna Merritts, MD;  Location: [facility] ORS;  Service: Cardiovascular;  Laterality: N/A;  . ELECTROPHYSIOLOGIC STUDY N/A 04/25/2016   Procedure: Cardioversion;  Surgeon: Minna Merritts, MD;  Location: [facility] ORS;  Service: Cardiovascular;  Laterality: N/A;  . ELECTROPHYSIOLOGIC STUDY N/A 07/18/2016   Procedure: Atrial Fibrillation Ablation;  Surgeon: Thompson Grayer, MD;  Location: [place] CV LAB;  Service: Cardiovascular;  Laterality: N/A;  . IMPLANTABLE CARDIOVERTER DEFIBRILLATOR IMPLANT  2005   [employer]   . TEE WITHOUT CARDIOVERSION N/A 07/18/2016   Procedure: TRANSESOPHAGEAL ECHOCARDIOGRAM (TEE);  Surgeon: Sueanne Margarita, MD;  Location: [facility] ENDOSCOPY;  Service: Cardiovascular;  Laterality: N/A;  . TEE WITHOUT CARDIOVERSION N/A  10/09/2016   Procedure: TRANSESOPHAGEAL ECHOCARDIOGRAM (TEE);  Surgeon: Lelon Perla, MD;  Location: [facility] ENDOSCOPY;  Service: Cardiovascular;  Laterality: N/A;   Social History Social History   Tobacco Use  . Smoking status: Never Smoker  . Smokeless tobacco: Never Used  Substance Use Topics  . Alcohol use: No  . Drug use: No   Family History Family History  Problem Relation Age of Onset  . CAD Mother   . Diabetes Mother   . Atrial fibrillation Mother   . Lung cancer Father   . Diabetes Father   Denies family history of peripheral artery disease, venous disease or bleeding/clotting disorders  Allergies  Allergen Reactions  . Piperacillin Other (See Comments)    Renal failure  . 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 See ID note**PER PT-CAUSED RENAL FAILURE**  . Zosyn [Piperacillin Sod-Tazobactam So] Other (See Comments)    Renal failure     REVIEW OF SYSTEMS (Negative unless checked)  Constitutional: [] Weight loss  [] Fever  [] Chills Cardiac: [] Chest pain   [] Chest pressure   [] Palpitations   [] Shortness of breath when laying flat   [] Shortness of breath at rest   [] Shortness of breath with exertion. Vascular:  [] Pain in legs with walking   [] Pain in legs at rest   [] Pain in legs when laying flat   [] Claudication   [] Pain in feet when walking  [] Pain in feet at rest  [] Pain in feet when laying flat   [] History of DVT   [] Phlebitis   [] Swelling in legs   [] Varicose veins   [] Non-healing ulcers Pulmonary:   [] Uses home oxygen   [] Productive cough   [] Hemoptysis   [] Wheeze  [x] COPD   [] Asthma Neurologic:  [] Dizziness  [] Blackouts   [] Seizures   [] History of stroke   [] History of TIA  [] Aphasia   [] Temporary blindness   [] Dysphagia   [] Weakness or numbness in arms   [] Weakness  or numbness in legs Musculoskeletal:  [] Arthritis   [] Joint swelling   [] Joint pain   [] Low back pain Hematologic:  [] Easy bruising  [] Easy bleeding    [] Hypercoagulable state   [] Anemic  [] Hepatitis Gastrointestinal:  [] Blood in stool   [] Vomiting blood  [x] Gastroesophageal reflux/heartburn   [] Difficulty swallowing. Genitourinary:  [x] Chronic kidney disease   [] Difficult urination  [] Frequent urination  [] Burning with urination   [] Blood in urine Skin:  [] Rashes   [] Ulcers   [] Wounds Psychological:  [] History of anxiety   []  History of major depression.  Physical Examination  Vitals:   10/20/18 2047 10/21/18 0607 10/21/18 0732 10/21/18 0846  BP: (!) 141/77 (!) 155/55  (!) 151/61  Pulse: 61 63  64  Resp: 16 16    Temp: 98.4 F (36.9 C) 97.9 F (36.6 C)  98 F (36.7 C)  TempSrc: Oral Oral  Oral  SpO2: 97% 98% 95% 95%  Weight:  112.1 kg    Height:       Body mass index is 37.58 kg/m. Gen:  WD/WN, NAD Head: [place]/AT, No temporalis wasting. Prominent temp pulse not noted. Ear/Nose/Throat: Hearing grossly intact, nares w/o erythema or drainage, oropharynx w/o Erythema/Exudate Eyes: Sclera non-icteric, conjunctiva clear Neck: Trachea midline.  No JVD.  Pulmonary:  Good air movement, respirations not labored, equal bilaterally.  Cardiac: Irregularly irregular Vascular:  Vessel Right Left  Radial Palpable Palpable  Ulnar Palpable Palpable  Brachial Palpable Palpable  Carotid Palpable, without bruit Palpable, without bruit  Aorta Not palpable N/A  Femoral Palpable Palpable  Popliteal Palpable Palpable  PT    DP     Gastrointestinal: soft, non-tender/non-distended. No guarding/reflex.  Musculoskeletal: M/S 5/5 throughout. No edema. Neurologic: Sensation grossly intact in extremities.  Symmetrical.  Speech is fluent. Motor exam as listed above. Psychiatric: Judgment intact, Mood & affect appropriate for pt's clinical situation. Dermatologic: No rashes or ulcers noted.  No cellulitis or open wounds. Lymph : No Cervical, Axillary, or Inguinal lymphadenopathy.  CBC Lab Results  Component Value Date   WBC 14.7 (H) 10/17/2018    HGB 11.2 (L) 10/17/2018   HCT 36.9 10/17/2018   MCV 83.5 10/17/2018   PLT 96 (L) 10/17/2018   BMET    Component Value Date/Time   NA 131 (L) 10/21/2018 0426   NA 142 10/07/2018 1425   K 4.0 10/21/2018 0426   CL 97 (L) 10/21/2018 0426   CO2 23 10/21/2018 0426   GLUCOSE 310 (H) 10/21/2018 0426   BUN 111 (H) 10/21/2018 0426   BUN 39 (H) 10/07/2018 1425   CREATININE 4.27 (H) 10/21/2018 0426   CALCIUM 8.4 (L) 10/21/2018 0426   GFRNONAA 10 (L) 10/21/2018 0426   GFRAA 11 (L) 10/21/2018 0426   Estimated Creatinine Clearance: 16.1 mL/min (A) (by C-G formula based on SCr of 4.27 mg/dL (H)).  COAG Lab Results  Component Value Date   INR 2.14 10/21/2018   INR 3.64 10/20/2018   INR 3.82 10/19/2018   Radiology Dg Chest 2 View  Result Date: 10/16/2018 CLINICAL DATA:  Initial evaluation for acute cough. EXAM: CHEST - 2 VIEW COMPARISON:  Prior radiograph from 04/14/2018. FINDINGS: Left-sided pacemaker/AICD in place. Median sternotomy wires noted. Stable cardiomegaly. Mediastinal silhouette normal. Aortic atherosclerosis. Lungs mildly hypoinflated. No focal infiltrates. Mild perihilar vascular congestion without overt pulmonary edema. No pleural effusion. No pneumothorax. No acute osseous finding. IMPRESSION: 1. Cardiomegaly with mild perihilar vascular congestion without overt pulmonary edema. 2. No other active cardiopulmonary disease. Electronically Signed   By: Marland Kitchen  Jeannine Boga M.D.   On: 10/16/2018 04:54   US Renal  Result Date: 10/20/2018 CLINICAL DATA:  Acute renal failure EXAM: RENAL / URINARY TRACT ULTRASOUND COMPLETE COMPARISON:  None. FINDINGS: Right Kidney: Renal measurements: 8.9 x 4.4 x 5.5 cm = volume: 113 mL . Echogenicity within normal limits. No mass or hydronephrosis visualized. Left Kidney: Renal measurements: 11.4 x 7.2 x 5.7 cm = volume: 250 mL. Echogenicity within normal limits. No mass or hydronephrosis visualized. Bladder: Poorly visualized/decompressed by indwelling  Foley catheter. IMPRESSION: No hydronephrosis. Bladder decompressed by indwelling Foley catheter. Electronically Signed   By: Julian Hy M.D.   On: 10/20/2018 17:19   Assessment/Plan The patient is a 73 year old female with past medical history of congestive heart failure, acute on chronic renal failure, coronary artery disease, chronic A. Fib, essential hypertension, peripheral artery disease status post bilateral BKA, hyperlipidemia, DM, GERD, CLL presented to the [facility] emergency department with a chief complaint of weakness. 1. ESRD: With known history of chronic kidney disease now progressing to end-stage renal disease.  Nephrology has discussed initiating dialysis.  The patient and her daughter would like to proceed however the patient does not have an adequate dialysis access at this time.  Recommend placing a PermCath to allow the patient to dialyze as soon as possible.  The patient's INR is 2.14.  I will give 5mg  vitamin K tonight to reverse this.  Procedure, risks and benefits were explained to the patient and her daughter were at the bedside.  All questions were answered.  The patient and her daughter wish to proceed.  This is planned for tomorrow with Dr. stair. 2.  Peritoneal dialysis: The patient would like to move forward with peritoneal dialysis status and more permanent dialysis access.  Procedure, risks and benefits were explained to the patient.  All questions were answered.  Again, the patient's INR cannot be theraputic for this procedure.  I will consult pharmacy and asked them to give recommendations on a heparin drip for the patient's chronic atrial fibrillation in preparation for these upcoming procedures and surgery.  We will plan on this procedure on Wed or Friday - schedule depending with Dr. Delana Meyer. 3. Hyperlipidemia: Encouraged good control as its slows the progression of atherosclerotic disease 4. Diabetes: Encouraged good control as its slows  the progression of atherosclerotic disease  Discussed with Dr. Francene Castle, PA-C  10/21/2018 4:14 PM    This note was created with Dragon medical transcription system.  Any error is purely unintentional

## 2018-10-21 NOTE — Progress Notes (Signed)
[facility]  ROUNDING NOTE   Subjective:   UOP 600  Patient states she is doing well.   Kidney function worse today.   Daughter at bedside.   Objective:  Vital signs in last 24 hours:  Temp:  [97.9 F (36.6 C)-98.4 F (36.9 C)] 98 F (36.7 C) (02/10 0846) Pulse Rate:  [60-64] 64 (02/10 0846) Resp:  [16-18] 16 (02/10 0607) BP: (141-155)/(55-77) 151/61 (02/10 0846) SpO2:  [95 %-98 %] 95 % (02/10 0846) Weight:  [112.1 kg] 112.1 kg (02/10 0607)  Weight change: 0.5 kg Filed Weights   10/19/18 0329 10/20/18 0500 10/21/18 0607  Weight: 109.8 kg 111.6 kg 112.1 kg    Intake/Output: I/O last 3 completed shifts: In: 312.4 [I.V.:12.4; IV Piggyback:300] Out: 4481 [Urine:1450]   Intake/Output this shift:  No intake/output data recorded.  Physical Exam: General: No acute distress  Head: Normocephalic, atraumatic. Moist oral mucosal membranes  Eyes: Anicteric  Neck: Supple, trachea midline  Lungs:  Scattered rhonchi, normal effort  Heart: S1S2 no rubs  Abdomen:  Soft, nontender, bowel sounds present  Extremities: B/L BKA  Neurologic: Awake, alert, following commands  Skin: No lesions       Basic Metabolic Panel: Recent Labs  Lab 10/17/18 0530 10/18/18 0621 10/19/18 0507 10/20/18 0244 10/21/18 0426  NA 135 132* 130* 132* 131*  K 4.2 4.3 4.3 4.1 4.0  CL 100 96* 96* 97* 97*  CO2 25 23 23 23 23   GLUCOSE 95 265* 309* 305* 310*  BUN 52* 70* 86* 98* 111*  CREATININE 3.60* 3.86* 3.97* 3.98* 4.27*  CALCIUM 8.5* 8.4* 8.7* 8.4* 8.4*    Liver Function Tests: Recent Labs  Lab 10/16/18 0405 10/17/18 0530  AST 36 45*  ALT 22 25  ALKPHOS 84 67  BILITOT 1.3* 0.8  PROT 7.3 7.0  ALBUMIN 4.2 3.7   No results for input(s): LIPASE, AMYLASE in the last 168 hours. No results for input(s): AMMONIA in the last 168 hours.  CBC: Recent Labs  Lab 10/16/18 0405 10/17/18 0530  WBC 16.7* 14.7*  HGB 12.0 11.2*  HCT 39.5 36.9  MCV 82.8 83.5  PLT 103* 96*     Cardiac Enzymes: Recent Labs  Lab 10/16/18 0650 10/16/18 1614 10/17/18 0530  TROPONINI 0.09* 0.12* 0.10*    BNP: Invalid input(s): POCBNP  CBG: Recent Labs  Lab 10/20/18 1624 10/20/18 2047 10/20/18 2232 10/21/18 0847 10/21/18 1213  GLUCAP 316* 301* 345* 316* 324*    Microbiology: Results for orders placed or performed during the hospital encounter of 10/16/18  Respiratory Panel by PCR     Status: Abnormal   Collection Time: 10/16/18  4:05 AM  Result Value Ref Range Status   Adenovirus NOT DETECTED NOT DETECTED Final   Coronavirus 229E NOT DETECTED NOT DETECTED Final    Comment: (NOTE) The Coronavirus on the Respiratory Panel, DOES NOT test for the novel  Coronavirus (2019 nCoV)    Coronavirus HKU1 NOT DETECTED NOT DETECTED Final   Coronavirus NL63 NOT DETECTED NOT DETECTED Final   Coronavirus OC43 NOT DETECTED NOT DETECTED Final   Metapneumovirus DETECTED (A) NOT DETECTED Final   Rhinovirus / Enterovirus NOT DETECTED NOT DETECTED Final   Influenza A NOT DETECTED NOT DETECTED Final   Influenza B NOT DETECTED NOT DETECTED Final   Parainfluenza Virus 1 NOT DETECTED NOT DETECTED Final   Parainfluenza Virus 2 NOT DETECTED NOT DETECTED Final   Parainfluenza Virus 3 NOT DETECTED NOT DETECTED Final   Parainfluenza Virus 4 NOT DETECTED  NOT DETECTED Final   Respiratory Syncytial Virus NOT DETECTED NOT DETECTED Final   Bordetella pertussis NOT DETECTED NOT DETECTED Final   Chlamydophila pneumoniae NOT DETECTED NOT DETECTED Final   Mycoplasma pneumoniae NOT DETECTED NOT DETECTED Final    Comment: Performed at [facility], [address], [place], [place] [address]  Urine culture     Status: Abnormal   Collection Time: 10/16/18  4:23 AM  Result Value Ref Range Status   Specimen Description URINE, CLEAN CATCH  Final   Special Requests   Final    NONE Performed at [facility], [address] [place], [place] [address]    Culture >=100,000  COLONIES/mL KLEBSIELLA PNEUMONIAE (A)  Final   Report Status 10/19/2018 FINAL  Final   Organism ID, Bacteria KLEBSIELLA PNEUMONIAE (A)  Final      Susceptibility   Klebsiella pneumoniae - MIC*    AMPICILLIN RESISTANT Resistant     CEFAZOLIN <=4 SENSITIVE Sensitive     CEFTRIAXONE <=1 SENSITIVE Sensitive     CIPROFLOXACIN <=0.25 SENSITIVE Sensitive     GENTAMICIN <=1 SENSITIVE Sensitive     IMIPENEM <=0.25 SENSITIVE Sensitive     NITROFURANTOIN <=16 SENSITIVE Sensitive     TRIMETH/SULFA <=20 SENSITIVE Sensitive     AMPICILLIN/SULBACTAM 4 SENSITIVE Sensitive     PIP/TAZO <=4 SENSITIVE Sensitive     Extended ESBL NEGATIVE Sensitive     * >=100,000 COLONIES/mL KLEBSIELLA PNEUMONIAE  MRSA PCR Screening     Status: Abnormal   Collection Time: 10/16/18  5:13 PM  Result Value Ref Range Status   MRSA by PCR POSITIVE (A) NEGATIVE Final    Comment:        The GeneXpert MRSA Assay (FDA approved for NASAL specimens only), is one component of a comprehensive MRSA colonization surveillance program. It is not intended to diagnose MRSA infection nor to guide or monitor treatment for MRSA infections. RESULT CALLED TO, READ BACK BY AND VERIFIED WITH: MAI QUIARORO 10/16/18 @ 1838  [facility] Performed at [facility], [address] [place], [place] [address]     Coagulation Studies: Recent Labs    10/19/18 0507 10/20/18 0244 10/21/18 0426  LABPROT 37.0* 35.7* 23.6*  INR 3.82 3.64 2.14    Urinalysis: No results for input(s): COLORURINE, LABSPEC, PHURINE, GLUCOSEU, HGBUR, BILIRUBINUR, KETONESUR, PROTEINUR, UROBILINOGEN, NITRITE, LEUKOCYTESUR in the last 72 hours.  Invalid input(s): APPERANCEUR    Imaging: US Renal  Result Date: 10/20/2018 CLINICAL DATA:  Acute renal failure EXAM: RENAL / URINARY TRACT ULTRASOUND COMPLETE COMPARISON:  None. FINDINGS: Right Kidney: Renal measurements: 8.9 x 4.4 x 5.5 cm = volume: 113 mL . Echogenicity within normal limits. No mass or hydronephrosis  visualized. Left Kidney: Renal measurements: 11.4 x 7.2 x 5.7 cm = volume: 250 mL. Echogenicity within normal limits. No mass or hydronephrosis visualized. Bladder: Poorly visualized/decompressed by indwelling Foley catheter. IMPRESSION: No hydronephrosis. Bladder decompressed by indwelling Foley catheter. Electronically Signed   By: Julian Hy M.D.   On: 10/20/2018 17:19     Medications:   . sodium chloride 500 mL (10/21/18 0605)   . albuterol  2.5 mg Nebulization TID  . amiodarone  400 mg Oral BID  . amitriptyline  25 mg Oral QHS  . amLODipine  10 mg Oral Daily  . atorvastatin  40 mg Oral QHS  . budesonide (PULMICORT) nebulizer solution  0.5 mg Nebulization BID  . carvedilol  3.125 mg Oral Daily  . Chlorhexidine Gluconate Cloth  6 each  Topical Q0600  . ciprofloxacin  250 mg Oral Daily  . gabapentin  100 mg Oral BID  . Gerhardt's butt cream   Topical BID  . insulin aspart  0-15 Units Subcutaneous TID WC  . insulin aspart  0-5 Units Subcutaneous QHS  . insulin glargine  20 Units Subcutaneous Daily  . isosorbide mononitrate  30 mg Oral Daily  . lactulose  20 g Oral BID  . mupirocin ointment  1 application Nasal BID  . pantoprazole  40 mg Oral QHS  . [START ON 10/22/2018] predniSONE  40 mg Oral Q breakfast  . senna  1 tablet Oral Daily  . sertraline  100 mg Oral QHS  . sodium chloride flush  3 mL Intravenous Q12H  . warfarin  4 mg Oral ONCE-1800  . Warfarin - Pharmacist Dosing Inpatient   Does not apply q1800   sodium chloride, acetaminophen **OR** acetaminophen, guaiFENesin-dextromethorphan, hydrALAZINE, hydrOXYzine, ipratropium, nitroGLYCERIN, ondansetron **OR** ondansetron (ZOFRAN) IV, traMADol  Assessment/ Plan:  Ms. Emaleigh Guimond is a 73 y.o. Albanese female with atrial fibrillation, hypertension, diabetes mellitus type II insulin dependent, diabetic neuropathy, congestive heart failure, coronary artery disease status post CABG, GERD, CLL, bilateral below the knee  amputations admitted with cough, shortness of breath, UTI and now with acute renal failure with superimposed chronic kidney disease stage IV.  1.  Acute renal failure on chronic kidney disease stage IV: concern that patient has progressed to End Stage Renal Disease.  Discussed dialysis with patient.  Consult vascular for access placement.  Patient and daughter are amendable for dialysis. Discussed both home and in center modalities.   2.  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  Hemoglobin 11.2.    3.  Urinary tract infection. Klebsiella - ceftriaxone  4. Diabetes mellitus type II with chronic kidney disease:history of poor control.   5. Hypertension: mildly elevated - amlodipine.    LOS: Avis Jsiah Menta 2/10/20201:40 PM

## 2018-10-21 NOTE — Plan of Care (Signed)
  Problem: Activity: Goal: Capacity to carry out activities will improve Outcome: Progressing   Problem: Cardiac: Goal: Ability to achieve and maintain adequate cardiopulmonary perfusion will improve Outcome: Progressing   

## 2018-10-22 ENCOUNTER — Ambulatory Visit: Payer: Medicare HMO | Admitting: Physician Assistant

## 2018-10-22 ENCOUNTER — Encounter
Admission: EM | Disposition: A | Discharge status: Home / Self Care | Payer: Self-pay | Source: Home / Self Care | Attending: Internal Medicine

## 2018-10-22 ENCOUNTER — Ambulatory Visit: Payer: Medicare HMO

## 2018-10-22 DIAGNOSIS — Z992 Dependence on renal dialysis: Secondary | ICD-10-CM

## 2018-10-22 DIAGNOSIS — N186 End stage renal disease: Secondary | ICD-10-CM

## 2018-10-22 HISTORY — PX: DIALYSIS/PERMA CATHETER INSERTION: CATH118288

## 2018-10-22 LAB — GLUCOSE, CAPILLARY
Glucose-Capillary: 173 mg/dL — ABNORMAL HIGH (ref 70–99)
Glucose-Capillary: 160 mg/dL — ABNORMAL HIGH (ref 70–99)
Glucose-Capillary: 206 mg/dL — ABNORMAL HIGH (ref 70–99)

## 2018-10-22 LAB — CBC
HCT: 35.3 % — ABNORMAL LOW (ref 36.0–46.0)
Hemoglobin: 11.1 g/dL — ABNORMAL LOW (ref 12.0–15.0)
MCH: 25.3 pg — ABNORMAL LOW (ref 26.0–34.0)
MCHC: 31.4 g/dL (ref 30.0–36.0)
MCV: 80.6 fL (ref 80.0–100.0)
Platelets: 130 10*3/uL — ABNORMAL LOW (ref 150–400)
RBC: 4.38 MIL/uL (ref 3.87–5.11)
RDW: 18.2 % — ABNORMAL HIGH (ref 11.5–15.5)
WBC: 36.1 10*3/uL — ABNORMAL HIGH (ref 4.0–10.5)
nRBC: 0 % (ref 0.0–0.2)

## 2018-10-22 LAB — RENAL FUNCTION PANEL
Albumin: 3.6 g/dL (ref 3.5–5.0)
Anion gap: 11 (ref 5–15)
BUN: 114 mg/dL — AB (ref 8–23)
CO2: 23 mmol/L (ref 22–32)
Calcium: 8.2 mg/dL — ABNORMAL LOW (ref 8.9–10.3)
Chloride: 98 mmol/L (ref 98–111)
Creatinine, Ser: 4.19 mg/dL — ABNORMAL HIGH (ref 0.44–1.00)
GFR calc Af Amer: 12 mL/min — ABNORMAL LOW (ref >60)
GFR calc non Af Amer: 10 mL/min — ABNORMAL LOW (ref >60)
Glucose, Bld: 209 mg/dL — ABNORMAL HIGH (ref 70–99)
PHOSPHORUS: 5.5 mg/dL — AB (ref 2.5–4.6)
Potassium: 4 mmol/L (ref 3.5–5.1)
Sodium: 132 mmol/L — ABNORMAL LOW (ref 135–145)

## 2018-10-22 LAB — MAGNESIUM: MAGNESIUM: 2.7 mg/dL — AB (ref 1.7–2.4)

## 2018-10-22 LAB — PROTIME-INR
INR: 1.65
Prothrombin Time: 19.3 seconds — ABNORMAL HIGH (ref 11.4–15.2)

## 2018-10-22 LAB — HEPARIN LEVEL (UNFRACTIONATED)
Heparin Unfractionated: 0.55 IU/mL (ref 0.30–0.70)
Heparin Unfractionated: 0.27 IU/mL — ABNORMAL LOW (ref 0.30–0.70)

## 2018-10-22 SURGERY — DIALYSIS/PERMA CATHETER INSERTION
Anesthesia: Choice

## 2018-10-22 MED ORDER — MIDAZOLAM HCL 2 MG/2ML IJ SOLN
INTRAMUSCULAR | Status: DC | PRN
  Administered 2018-10-22: 2 mg via INTRAVENOUS
  Administered 2018-10-22: 1 mg via INTRAVENOUS

## 2018-10-22 MED ORDER — FAMOTIDINE 20 MG PO TABS: 40.0000 mg | ORAL_TABLET | ORAL | Status: DC | PRN

## 2018-10-22 MED ORDER — METHYLPREDNISOLONE SODIUM SUCC 125 MG IJ SOLR: 125.0000 mg | INTRAMUSCULAR | Status: DC | PRN

## 2018-10-22 MED ORDER — FENTANYL CITRATE (PF) 100 MCG/2ML IJ SOLN
INTRAMUSCULAR | Status: AC
  Filled 2018-10-22: qty 2

## 2018-10-22 MED ORDER — CEFAZOLIN SODIUM-DEXTROSE 1-4 GM/50ML-% IV SOLN
INTRAVENOUS | Status: AC
  Filled 2018-10-22: qty 50

## 2018-10-22 MED ORDER — NEPRO/CARBSTEADY PO LIQD
237.0000 mL | Freq: Two times a day (BID) | ORAL | Status: DC
  Administered 2018-10-23 – 2018-10-30 (×9): 237 mL via ORAL

## 2018-10-22 MED ORDER — FENTANYL CITRATE (PF) 100 MCG/2ML IJ SOLN
INTRAMUSCULAR | Status: DC | PRN
  Administered 2018-10-22: 50 ug via INTRAVENOUS
  Administered 2018-10-22: 25 ug via INTRAVENOUS

## 2018-10-22 MED ORDER — HEPARIN (PORCINE) IN NACL 1000-0.9 UT/500ML-% IV SOLN
INTRAVENOUS | Status: AC
  Filled 2018-10-22: qty 500

## 2018-10-22 MED ORDER — SODIUM CHLORIDE 0.9 % IV SOLN: INTRAVENOUS | Status: DC

## 2018-10-22 MED ORDER — RENA-VITE PO TABS
1.0000 | ORAL_TABLET | Freq: Every day | ORAL | Status: DC
  Administered 2018-10-23 – 2018-10-30 (×8): 1 via ORAL
  Filled 2018-10-22 (×11): qty 1

## 2018-10-22 MED ORDER — CHLORHEXIDINE GLUCONATE CLOTH 2 % EX PADS
6.0000 | MEDICATED_PAD | Freq: Every day | CUTANEOUS | Status: DC
  Administered 2018-10-23 – 2018-10-29 (×6): 6 via TOPICAL

## 2018-10-22 MED ORDER — DIPHENHYDRAMINE HCL 50 MG/ML IJ SOLN
50.0000 mg | Freq: Once | INTRAMUSCULAR | Status: AC
  Administered 2018-10-22: 50 mg via INTRAVENOUS
  Filled 2018-10-22: qty 1

## 2018-10-22 MED ORDER — LIDOCAINE-EPINEPHRINE (PF) 1 %-1:200000 IJ SOLN
INTRAMUSCULAR | Status: AC
  Filled 2018-10-22: qty 30

## 2018-10-22 MED ORDER — HYDROMORPHONE HCL 1 MG/ML IJ SOLN
1.0000 mg | Freq: Once | INTRAMUSCULAR | Status: AC | PRN
  Administered 2018-10-26: 1 mg via INTRAVENOUS
  Filled 2018-10-22: qty 1

## 2018-10-22 MED ORDER — CLINDAMYCIN PHOSPHATE 300 MG/50ML IV SOLN
INTRAVENOUS | Status: AC
  Filled 2018-10-22: qty 50

## 2018-10-22 MED ORDER — ONDANSETRON HCL 4 MG/2ML IJ SOLN: 4.0000 mg | Freq: Four times a day (QID) | INTRAMUSCULAR | Status: DC | PRN

## 2018-10-22 MED ORDER — MIDAZOLAM HCL 2 MG/ML PO SYRP: 8.0000 mg | ORAL_SOLUTION | Freq: Once | ORAL | Status: DC | PRN

## 2018-10-22 MED ORDER — MIDAZOLAM HCL 5 MG/5ML IJ SOLN
INTRAMUSCULAR | Status: AC
  Filled 2018-10-22: qty 5

## 2018-10-22 MED ORDER — HEPARIN SODIUM (PORCINE) 10000 UNIT/ML IJ SOLN
INTRAMUSCULAR | Status: AC
  Filled 2018-10-22: qty 1

## 2018-10-22 SURGICAL SUPPLY — 9 items
CATH PALINDROME-P 19CM W/VT (CATHETERS) ×3 IMPLANT
DERMABOND ADVANCED (GAUZE/BANDAGES/DRESSINGS) ×2
DERMABOND ADVANCED .7 DNX12 (GAUZE/BANDAGES/DRESSINGS) ×1 IMPLANT
NEEDLE ENTRY 21GA 7CM ECHOTIP (NEEDLE) ×3 IMPLANT
PACK ANGIOGRAPHY (CUSTOM PROCEDURE TRAY) ×3 IMPLANT
SET INTRO CAPELLA COAXIAL (SET/KITS/TRAYS/PACK) ×3 IMPLANT
SUT MNCRL AB 4-0 PS2 18 (SUTURE) ×3 IMPLANT
SUT SILK 0 FSL (SUTURE) ×3 IMPLANT
TOWEL OR 17X26 4PK STRL BLUE (TOWEL DISPOSABLE) ×3 IMPLANT

## 2018-10-22 NOTE — Op Note (Signed)
[facility]   OPERATIVE NOTE     PROCEDURE: 1. Insertion of a 19 cm tip to cuff palindrome tunneled dialysis catheter. 2. Catheter placement and cannulation under ultrasound and fluoroscopic guidance  PRE-OPERATIVE DIAGNOSIS: end-stage renal requiring hemodialysis  POST-OPERATIVE DIAGNOSIS: same as above  SURGEON: Hortencia Pilar  ANESTHESIA: Conscious sedation was administered under my direct supervision by the interventional radiology RN. IV Versed plus fentanyl were utilized. Continuous ECG, pulse oximetry and blood pressure was monitored throughout the entire procedure.  Conscious sedation was for a total of 31 minutes.  ESTIMATED BLOOD LOSS: Minimal  FINDING(S): 1.  Tips of the catheter in the right atrium on fluoroscopy 2.  No obvious pneumothorax on fluoroscopy  SPECIMEN(S):  none  INDICATIONS:   Caroline Hernandez is a 73 y.o. female  presents with end stage renal disease.  Therefore, the patient requires a tunneled dialysis catheter placement.  The patient is informed of  the risks catheter placement include but are not limited to: bleeding, infection, central venous injury, pneumothorax, possible venous stenosis, possible malpositioning in the venous system, and possible infections related to long-term catheter presence.  The patient was aware of these risks and agreed to proceed.  DESCRIPTION: The patient was taken back to Special Procedure suite.  Prior to sedation, the patient was given IV antibiotics.  After obtaining adequate sedation, the patient was prepped and draped in the standard fashion for a chest or neck tunneled dialysis catheter placement.  Appropriate Time Out is called.     The the right neck and chest wall are then infiltrated with 1% Lidocaine with epinepherine.  A 19 cm tip to cuff palindrome catheter is then selected, opened on the back table and prepped. Ultrasound is placed in a sterile sleeve.  Under ultrasound guidance, the right  internal jugular vein is examined and is noted to be echolucent and easily compressible indicating patency.   An image is recorded for the permanent record.  The right internal jugular vein is cannulated with the microneedle under direct ultrasound vissualization.  A Microwire followed by a micro sheath is then inserted without difficulty.   A J-wire was then advanced under fluoroscopic guidance into the inferior vena cava and the wire was secured.  Small counter incision was then made at the wire insertion site. A small pocket was fashioned with blunt dissection to allow easier passage of the cuff.  The dilator and peel-away sheath are then advanced over the wire under fluoroscopic guidance. Small incision is made at the selected exit site and the tunneling device was passed subcutaneously to the neck counter incision. Catheter is then pulled through the subcutaneous tunnel.  Catheter is then advanced through the peel-away sheath and the peel-away sheath is removed.  The catheter is then verified for tip position under fluoroscopy.    Each port was tested by aspirating and flushing.  No resistance was noted.  Each port was then thoroughly flushed with heparinized saline.  The catheter was secured in placed with two interrupted stitches of 0 silk tied to the catheter.  The neck incision was closed with a U-stitch of 4-0 Monocryl.  The neck and chest incision were cleaned and sterile bandages applied including a Biopatch.  Each port was then packed with concentrated heparin (1000 Units/mL) at the manufacturer recommended volumes to each port.  Sterile caps were applied to each port.  On completion fluoroscopy, the tips of the catheter were in the right atrium, and there was no evidence of  pneumothorax.  COMPLICATIONS: None  CONDITION: Unchanged   Hortencia Pilar [facility] Office: [phone]   10/22/2018, 8:24 PM

## 2018-10-22 NOTE — Progress Notes (Signed)
PT Cancellation Note  Patient Details Name: Sean Macwilliams MRN: [id number] DOB: 23-Jan-1946   Cancelled Treatment:    Reason Eval/Treat Not Completed: Other (comment): PT services held secondary to pt preparing for access catheter to be placed by vascular surgery today for HD.  Will attempt to see pt at a future date/time as appropriate.     Linus Salmons PT, DPT 10/22/18, 2:58 PM

## 2018-10-22 NOTE — Plan of Care (Signed)
  Problem: Activity: Goal: Capacity to carry out activities will improve Outcome: Progressing   Problem: Cardiac: Goal: Ability to achieve and maintain adequate cardiopulmonary perfusion will improve Outcome: Progressing   

## 2018-10-22 NOTE — Consult Note (Signed)
ANTICOAGULATION CONSULT NOTE - Initial Consult  Pharmacy Consult for Heparin infusion Indication: atrial fibrillation  Allergies  Allergen Reactions  . Piperacillin Other (See Comments)    Renal failure  . 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 See ID note**PER PT-CAUSED RENAL FAILURE**  . Zosyn [Piperacillin Sod-Tazobactam So] Other (See Comments)    Renal failure    Patient Measurements: Height: 5\' 8"  (172.7 cm) Weight: 246 lb 14.6 oz (112 kg) IBW/kg (Calculated) : 63.9 Heparin Dosing Weight: 84.8 kg  Vital Signs: Temp: 97.7 F (36.5 C) (02/11 0805) Temp Source: Oral (02/11 0550) BP: 141/76 (02/11 0805) Pulse Rate: 60 (02/11 0905)  Labs: Recent Labs    10/20/18 0244 10/21/18 0426 10/21/18 1658 10/22/18 0433  HGB  --   --  11.4* 11.1*  HCT  --   --  36.7 35.3*  PLT  --   --  123* 130*  LABPROT 35.7* 23.6*  --  19.3*  INR 3.64 2.14  --  1.65  CREATININE 3.98* 4.27*  --  4.19*    Estimated Creatinine Clearance: 16.4 mL/min (A) (by C-G formula based on SCr of 4.19 mg/dL (H)).   Medical History: Past Medical History:  Diagnosis Date  . Amputation of left lower extremity below knee ([place])   . Amputation of right lower extremity below knee ([place])   . CAD (coronary artery disease)    a. 2004 Cardiac Arrest/CABG x 3 (LIMA->LAD, VG->OM, VG->RCA);  b. 06/2015 lexiscan MV: no significant ischemia, EF 48%, low risk->Med Rx.  . Chronic combined systolic and diastolic CHF (congestive heart failure) ([place])    a. 12/2014 Echo: EF 45-50%;  b. 08/2015 Echo: EF 45-50%, ant, antsept HK, mildly dil LA, nl RV, mild-mod TR, sev PAH (61mmHg); b. 08/2017 TEE: EF 40-45%, large PFO w/ L->R shunting.  . CKD (chronic kidney disease), stage IV ([place])   . CLL (chronic lymphocytic leukemia) ([place])   . Diabetes mellitus without complication ([place])   . Essential hypertension   . GERD (gastroesophageal reflux disease)   . Hiatal hernia   . History of  cardiac arrest    a. 2004.  Marland Kitchen Hyperlipidemia   . Ischemic cardiomyopathy    a. s/p MDT ICD (originally had 3785 lead-->gen change and lead revision ~ 2012 @ [facility]); b. 12/2014 Echo: EF 45-50%;  c.08/2015 Echo: EF 45-50%; d. 08/2017 Echo: EF 40-45%.  Marland Kitchen PAD (peripheral artery disease) ([facility])    a. s/p bilat BKA  . Persistent atrial fibrillation    a. Dx 12/2014.  CHA2DS2VASc = 6--> warfarin;  b. 09/2015 s/p DCCV-->on amio; c. s/p DCCV 04/25/16; d. 07/2016 s/p RFCA/PVI in setting of recurrent afib despite amio; e. 05/2018 Recurrent afib.    Medications:  Scheduled:  . albuterol  2.5 mg Nebulization TID  . amiodarone  400 mg Oral BID  . amitriptyline  25 mg Oral QHS  . amLODipine  10 mg Oral Daily  . atorvastatin  40 mg Oral QHS  . budesonide (PULMICORT) nebulizer solution  0.5 mg Nebulization BID  . carvedilol  3.125 mg Oral Daily  . cephALEXin  250 mg Oral Q12H  . Chlorhexidine Gluconate Cloth  6 each Topical Q0600  . diphenhydrAMINE  50 mg Intravenous Once  . gabapentin  100 mg Oral BID  . Gerhardt's butt cream   Topical BID  . insulin aspart  0-15 Units Subcutaneous TID WC  . insulin aspart  0-5 Units Subcutaneous QHS  . insulin aspart  5 Units Subcutaneous TID WC  . insulin glargine  20 Units Subcutaneous BID  . isosorbide mononitrate  30 mg Oral Daily  . lactulose  20 g Oral BID  . mupirocin ointment  1 application Nasal BID  . pantoprazole  40 mg Oral QHS  . predniSONE  40 mg Oral Q breakfast  . senna  1 tablet Oral Daily  . sertraline  100 mg Oral QHS  . sodium chloride flush  3 mL Intravenous Q12H   Infusions:  . sodium chloride Stopped (10/22/18 0104)  . sodium chloride    . heparin 1,200 Units/hr (10/22/18 0415)    Assessment: Patient previously being anticoagulated with warfarin.  Patient now being transitioned to heparin infusion due to planned procedures of Permcath placement and peritoneal dialysis access.  Due to this vascular surgery is planning to give patient  Vitamin K 5 mg PO once for warfarin reversal with INR 2.14.    Per discussion with Marcelle Overlie, PA-C will start heparin drip without bolus.  Typical transition would be initiated when INR is a lower limit of goal range without bolus and dosed per indication.  Heparin is currently running at 1200 units/hr and level has not been checked prior to now (first level check was delayed)  2/11 1026 HL = 0.55 Therapeutic x 1  Goal of Therapy:  Heparin level 0.3-0.7 units/ml Monitor platelets by anticoagulation protocol: Yes   Plan:  Continue current drip rate and recheck heparin level in 8 hours per protocol   Lu Duffel, PharmD, BCPS Clinical Pharmacist 10/22/2018 10:53 AM

## 2018-10-22 NOTE — Progress Notes (Signed)
[facility]  ROUNDING NOTE   Subjective:   Patient has agreed for dialysis. Orders prepared. Catheter to be placed today.   Daughter at bedside.   Vitamin K given yesterday.   Objective:  Vital signs in last 24 hours:  Temp:  [97.7 F (36.5 C)-98.2 F (36.8 C)] 97.7 F (36.5 C) (02/11 0805) Pulse Rate:  [50-65] 60 (02/11 0905) Resp:  [18-20] 18 (02/11 0805) BP: (141-152)/(63-76) 141/76 (02/11 0805) SpO2:  [95 %-99 %] 99 % (02/11 0805) Weight:  [629 kg] 112 kg (02/11 0550)  Weight change: -0.1 kg Filed Weights   10/20/18 0500 10/21/18 0607 10/22/18 0550  Weight: 111.6 kg 112.1 kg 112 kg    Intake/Output: I/O last 3 completed shifts: In: 408.3 [I.V.:58.3; IV Piggyback:350] Out: 1150 [Urine:1150]   Intake/Output this shift:  Total I/O In: 3 [I.V.:3] Out: -   Physical Exam: General: No acute distress  Head: Normocephalic, atraumatic. Moist oral mucosal membranes  Eyes: Anicteric  Neck: Supple, trachea midline  Lungs:  Scattered rhonchi, normal effort  Heart: S1S2 no rubs  Abdomen:  Soft, nontender, bowel sounds present  Extremities: B/L BKA  Neurologic: Awake, alert, following commands  Skin: No lesions       Basic Metabolic Panel: Recent Labs  Lab 10/18/18 0621 10/19/18 0507 10/20/18 0244 10/21/18 0426 10/22/18 0433  NA 132* 130* 132* 131* 132*  K 4.3 4.3 4.1 4.0 4.0  CL 96* 96* 97* 97* 98  CO2 23 23 23 23 23   GLUCOSE 265* 309* 305* 310* 209*  BUN 70* 86* 98* 111* 114*  CREATININE 3.86* 3.97* 3.98* 4.27* 4.19*  CALCIUM 8.4* 8.7* 8.4* 8.4* 8.2*  MG  --   --   --   --  2.7*  PHOS  --   --   --   --  5.5*    Liver Function Tests: Recent Labs  Lab 10/16/18 0405 10/17/18 0530 10/22/18 0433  AST 36 45*  --   ALT 22 25  --   ALKPHOS 84 67  --   BILITOT 1.3* 0.8  --   PROT 7.3 7.0  --   ALBUMIN 4.2 3.7 3.6   No results for input(s): LIPASE, AMYLASE in the last 168 hours. No results for input(s): AMMONIA in the last 168  hours.  CBC: Recent Labs  Lab 10/16/18 0405 10/17/18 0530 10/21/18 1658 10/22/18 0433  WBC 16.7* 14.7* 31.0* 36.1*  HGB 12.0 11.2* 11.4* 11.1*  HCT 39.5 36.9 36.7 35.3*  MCV 82.8 83.5 81.0 80.6  PLT 103* 96* 123* 130*    Cardiac Enzymes: Recent Labs  Lab 10/16/18 0650 10/16/18 1614 10/17/18 0530  TROPONINI 0.09* 0.12* 0.10*    BNP: Invalid input(s): POCBNP  CBG: Recent Labs  Lab 10/21/18 1213 10/21/18 1715 10/21/18 2111 10/22/18 0802 10/22/18 1147  GLUCAP 324* 276* 302* 173* 160*    Microbiology: Results for orders placed or performed during the hospital encounter of 10/16/18  Respiratory Panel by PCR     Status: Abnormal   Collection Time: 10/16/18  4:05 AM  Result Value Ref Range Status   Adenovirus NOT DETECTED NOT DETECTED Final   Coronavirus 229E NOT DETECTED NOT DETECTED Final    Comment: (NOTE) The Coronavirus on the Respiratory Panel, DOES NOT test for the novel  Coronavirus (2019 nCoV)    Coronavirus HKU1 NOT DETECTED NOT DETECTED Final   Coronavirus NL63 NOT DETECTED NOT DETECTED Final   Coronavirus OC43 NOT DETECTED NOT DETECTED Final   Metapneumovirus DETECTED (  A) NOT DETECTED Final   Rhinovirus / Enterovirus NOT DETECTED NOT DETECTED Final   Influenza A NOT DETECTED NOT DETECTED Final   Influenza B NOT DETECTED NOT DETECTED Final   Parainfluenza Virus 1 NOT DETECTED NOT DETECTED Final   Parainfluenza Virus 2 NOT DETECTED NOT DETECTED Final   Parainfluenza Virus 3 NOT DETECTED NOT DETECTED Final   Parainfluenza Virus 4 NOT DETECTED NOT DETECTED Final   Respiratory Syncytial Virus NOT DETECTED NOT DETECTED Final   Bordetella pertussis NOT DETECTED NOT DETECTED Final   Chlamydophila pneumoniae NOT DETECTED NOT DETECTED Final   Mycoplasma pneumoniae NOT DETECTED NOT DETECTED Final    Comment: Performed at [facility], [place] [address], [place], [place] [address]  Urine culture     Status: Abnormal   Collection Time: 10/16/18  4:23 AM   Result Value Ref Range Status   Specimen Description URINE, CLEAN CATCH  Final   Special Requests   Final    NONE Performed at [facility], [address] [place], [place] [address]    Culture >=100,000 COLONIES/mL KLEBSIELLA PNEUMONIAE (A)  Final   Report Status 10/19/2018 FINAL  Final   Organism ID, Bacteria KLEBSIELLA PNEUMONIAE (A)  Final      Susceptibility   Klebsiella pneumoniae - MIC*    AMPICILLIN RESISTANT Resistant     CEFAZOLIN <=4 SENSITIVE Sensitive     CEFTRIAXONE <=1 SENSITIVE Sensitive     CIPROFLOXACIN <=0.25 SENSITIVE Sensitive     GENTAMICIN <=1 SENSITIVE Sensitive     IMIPENEM <=0.25 SENSITIVE Sensitive     NITROFURANTOIN <=16 SENSITIVE Sensitive     TRIMETH/SULFA <=20 SENSITIVE Sensitive     AMPICILLIN/SULBACTAM 4 SENSITIVE Sensitive     PIP/TAZO <=4 SENSITIVE Sensitive     Extended ESBL NEGATIVE Sensitive     * >=100,000 COLONIES/mL KLEBSIELLA PNEUMONIAE  MRSA PCR Screening     Status: Abnormal   Collection Time: 10/16/18  5:13 PM  Result Value Ref Range Status   MRSA by PCR POSITIVE (A) NEGATIVE Final    Comment:        The GeneXpert MRSA Assay (FDA approved for NASAL specimens only), is one component of a comprehensive MRSA colonization surveillance program. It is not intended to diagnose MRSA infection nor to guide or monitor treatment for MRSA infections. RESULT CALLED TO, READ BACK BY AND VERIFIED WITH: MAI QUIARORO 10/16/18 @ 1838  [facility] Performed at [facility], [address] [place], [place] [address]     Coagulation Studies: Recent Labs    10/20/18 0244 10/21/18 0426 10/22/18 0433  LABPROT 35.7* 23.6* 19.3*  INR 3.64 2.14 1.65    Urinalysis: No results for input(s): COLORURINE, LABSPEC, PHURINE, GLUCOSEU, HGBUR, BILIRUBINUR, KETONESUR, PROTEINUR, UROBILINOGEN, NITRITE, LEUKOCYTESUR in the last 72 hours.  Invalid input(s): APPERANCEUR    Imaging: US Renal  Result Date: 10/20/2018 CLINICAL DATA:  Acute  renal failure EXAM: RENAL / URINARY TRACT ULTRASOUND COMPLETE COMPARISON:  None. FINDINGS: Right Kidney: Renal measurements: 8.9 x 4.4 x 5.5 cm = volume: 113 mL . Echogenicity within normal limits. No mass or hydronephrosis visualized. Left Kidney: Renal measurements: 11.4 x 7.2 x 5.7 cm = volume: 250 mL. Echogenicity within normal limits. No mass or hydronephrosis visualized. Bladder: Poorly visualized/decompressed by indwelling Foley catheter. IMPRESSION: No hydronephrosis. Bladder decompressed by indwelling Foley catheter. Electronically Signed   By: Julian Hy M.D.   On: 10/20/2018 17:19     Medications:   . sodium chloride Stopped (10/22/18 0104)  .  sodium chloride    . heparin 1,200 Units/hr (10/22/18 0415)   . albuterol  2.5 mg Nebulization TID  . amiodarone  400 mg Oral BID  . amitriptyline  25 mg Oral QHS  . amLODipine  10 mg Oral Daily  . atorvastatin  40 mg Oral QHS  . budesonide (PULMICORT) nebulizer solution  0.5 mg Nebulization BID  . carvedilol  3.125 mg Oral Daily  . cephALEXin  250 mg Oral Q12H  . Chlorhexidine Gluconate Cloth  6 each Topical Q0600  . diphenhydrAMINE  50 mg Intravenous Once  . gabapentin  100 mg Oral BID  . Gerhardt's butt cream   Topical BID  . insulin aspart  0-15 Units Subcutaneous TID WC  . insulin aspart  0-5 Units Subcutaneous QHS  . insulin aspart  5 Units Subcutaneous TID WC  . insulin glargine  20 Units Subcutaneous BID  . isosorbide mononitrate  30 mg Oral Daily  . lactulose  20 g Oral BID  . mupirocin ointment  1 application Nasal BID  . pantoprazole  40 mg Oral QHS  . predniSONE  40 mg Oral Q breakfast  . senna  1 tablet Oral Daily  . sertraline  100 mg Oral QHS  . sodium chloride flush  3 mL Intravenous Q12H   sodium chloride, acetaminophen **OR** acetaminophen, famotidine, guaiFENesin-dextromethorphan, hydrALAZINE, HYDROmorphone (DILAUDID) injection, hydrOXYzine, ipratropium, methylPREDNISolone (SOLU-MEDROL) injection,  midazolam, nitroGLYCERIN, ondansetron **OR** ondansetron (ZOFRAN) IV, traMADol  Assessment/ Plan:  [name] is a 73 y.o. [place] female with atrial fibrillation, hypertension, diabetes mellitus type II insulin dependent, diabetic neuropathy, congestive heart failure, coronary artery disease status post CABG, GERD, CLL, bilateral below the knee amputations admitted with cough, shortness of breath, UTI and now with acute renal failure with superimposed chronic kidney disease stage IV.  1.  End Stage Renal Disease with hyponatremia and uremia.  Discussed dialysis with patient.  Consult vascular for access placement.  Dialysis for later today.  Outpatient planning for [facility].  Patient wants to eventually do peritoneal dialysis at home. Will see about PD catheter placement during this admission.   2.  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  Hemoglobin 11.1. Thrombocytopenia    3.  Urinary tract infection. Klebsiella - cephalexin  4. Diabetes mellitus type II with chronic kidney disease:insulin dependent. history of poor control.   5. Hypertension:  - amlodipine.    LOS: 6 Caroline Hernandez 2/11/20201:31 PM

## 2018-10-22 NOTE — Consult Note (Signed)
ANTICOAGULATION CONSULT NOTE - Initial Consult  Pharmacy Consult for Heparin infusion Indication: atrial fibrillation  Allergies  Allergen Reactions  . Piperacillin Other (See Comments)    Renal failure  . 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 See ID note**PER PT-CAUSED RENAL FAILURE**  . Zosyn [Piperacillin Sod-Tazobactam So] Other (See Comments)    Renal failure    Patient Measurements: Height: 5\' 8"  (172.7 cm) Weight: 246 lb 14.6 oz (112 kg) IBW/kg (Calculated) : 63.9 Heparin Dosing Weight: 84.8 kg  Vital Signs: Temp: 97.6 F (36.4 C) (02/11 2141) Temp Source: Oral (02/11 2141) BP: 145/70 (02/11 2141) Pulse Rate: 61 (02/11 2141)  Labs: Recent Labs    10/20/18 0244 10/21/18 0426 10/21/18 1658 10/22/18 0433 10/22/18 1026  HGB  --   --  11.4* 11.1*  --   HCT  --   --  36.7 35.3*  --   PLT  --   --  123* 130*  --   LABPROT 35.7* 23.6*  --  19.3*  --   INR 3.64 2.14  --  1.65  --   HEPARINUNFRC  --   --   --   --  0.55  CREATININE 3.98* 4.27*  --  4.19*  --     Estimated Creatinine Clearance: 16.4 mL/min (A) (by C-G formula based on SCr of 4.19 mg/dL (H)).   Medical History: Past Medical History:  Diagnosis Date  . Amputation of left lower extremity below knee ([place])   . Amputation of right lower extremity below knee ([place])   . CAD (coronary artery disease)    a. 2004 Cardiac Arrest/CABG x 3 (LIMA->LAD, VG->OM, VG->RCA);  b. 06/2015 lexiscan MV: no significant ischemia, EF 48%, low risk->Med Rx.  . Chronic combined systolic and diastolic CHF (congestive heart failure) ([place])    a. 12/2014 Echo: EF 45-50%;  b. 08/2015 Echo: EF 45-50%, ant, antsept HK, mildly dil LA, nl RV, mild-mod TR, sev PAH (77mmHg); b. 08/2017 TEE: EF 40-45%, large PFO w/ L->R shunting.  . CKD (chronic kidney disease), stage IV ([place])   . CLL (chronic lymphocytic leukemia) ([place])   . Diabetes mellitus without complication ([place])   . Essential hypertension    . GERD (gastroesophageal reflux disease)   . Hiatal hernia   . History of cardiac arrest    a. 2004.  Marland Kitchen Hyperlipidemia   . Ischemic cardiomyopathy    a. s/p MDT ICD (originally had 9357 lead-->gen change and lead revision ~ 2012 @ [place] [facility]); b. 12/2014 Echo: EF 45-50%;  c.08/2015 Echo: EF 45-50%; d. 08/2017 Echo: EF 40-45%.  Marland Kitchen PAD (peripheral artery disease) (H[facility])    a. s/p bilat BKA  . Persistent atrial fibrillation    a. Dx 12/2014.  CHA2DS2VASc = 6--> warfarin;  b. 09/2015 s/p DCCV-->on amio; c. s/p DCCV 04/25/16; d. 07/2016 s/p RFCA/PVI in setting of recurrent afib despite amio; e. 05/2018 Recurrent afib.    Medications:  Scheduled:  . albuterol  2.5 mg Nebulization TID  . amiodarone  400 mg Oral BID  . amitriptyline  25 mg Oral QHS  . amLODipine  10 mg Oral Daily  . atorvastatin  40 mg Oral QHS  . budesonide (PULMICORT) nebulizer solution  0.5 mg Nebulization BID  . carvedilol  3.125 mg Oral Daily  . cephALEXin  250 mg Oral Q12H  . [START ON 10/23/2018] Chlorhexidine Gluconate Cloth  6 each Topical Q0600  . [START ON 10/23/2018] feeding supplement (NEPRO CARB STEADY)  237 mL Oral BID BM  . gabapentin  100 mg Oral BID  . Gerhardt's butt cream   Topical BID  . insulin aspart  0-15 Units Subcutaneous TID WC  . insulin aspart  0-5 Units Subcutaneous QHS  . insulin aspart  5 Units Subcutaneous TID WC  . insulin glargine  20 Units Subcutaneous BID  . isosorbide mononitrate  30 mg Oral Daily  . lactulose  20 g Oral BID  . [START ON 10/23/2018] multivitamin  1 tablet Oral QHS  . pantoprazole  40 mg Oral QHS  . predniSONE  40 mg Oral Q breakfast  . senna  1 tablet Oral Daily  . sertraline  100 mg Oral QHS  . sodium chloride flush  3 mL Intravenous Q12H   Infusions:  . sodium chloride Stopped (10/22/18 0104)  . heparin 1,200 Units/hr (10/22/18 2248)    Assessment: Patient previously being anticoagulated with warfarin.  Patient now being transitioned to heparin infusion due to planned  procedures of Permcath placement and peritoneal dialysis access.  Due to this vascular surgery is planning to give patient Vitamin K 5 mg PO once for warfarin reversal with INR 2.14.    Per discussion with Marcelle Overlie, PA-C will start heparin drip without bolus.  Typical transition would be initiated when INR is a lower limit of goal range without bolus and dosed per indication.  Heparin is currently running at 1200 units/hr and level has not been checked prior to now (first level check was delayed)  2/11 1026 HL = 0.55 Therapeutic x 1  Goal of Therapy:  Heparin level 0.3-0.7 units/ml Monitor platelets by anticoagulation protocol: Yes   Plan:  02/11 @ 2248 heparin drip restarted @ 1200 units/hr and will check HL @ 0700. CBC stable will continue to monitor.  Tobie Lords, PharmD, BCPS Clinical Pharmacist 10/22/2018 10:58 PM

## 2018-10-22 NOTE — Consult Note (Signed)
ANTICOAGULATION CONSULT NOTE - Initial Consult  Pharmacy Consult for Heparin infusion Indication: atrial fibrillation  Allergies  Allergen Reactions  . Piperacillin Other (See Comments)    Renal failure  . 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 See ID note**PER PT-CAUSED RENAL FAILURE**  . Zosyn [Piperacillin Sod-Tazobactam So] Other (See Comments)    Renal failure    Patient Measurements: Height: 5\' 8"  (172.7 cm) Weight: 247 lb 2.2 oz (112.1 kg) IBW/kg (Calculated) : 63.9 Heparin Dosing Weight: 84.8 kg  Vital Signs: Temp: 97.7 F (36.5 C) (02/10 2046) Temp Source: Oral (02/10 2046) BP: 149/70 (02/10 2046) Pulse Rate: 65 (02/10 2046)  Labs: Recent Labs    10/19/18 0507 10/20/18 0244 10/21/18 0426 10/21/18 1658  HGB  --   --   --  11.4*  HCT  --   --   --  36.7  PLT  --   --   --  123*  LABPROT 37.0* 35.7* 23.6*  --   INR 3.82 3.64 2.14  --   CREATININE 3.97* 3.98* 4.27*  --     Estimated Creatinine Clearance: 16.1 mL/min (A) (by C-G formula based on SCr of 4.27 mg/dL (H)).   Medical History: Past Medical History:  Diagnosis Date  . Amputation of left lower extremity below knee ([place])   . Amputation of right lower extremity below knee ([place])   . CAD (coronary artery disease)    a. 2004 Cardiac Arrest/CABG x 3 (LIMA->LAD, VG->OM, VG->RCA);  b. 06/2015 lexiscan MV: no significant ischemia, EF 48%, low risk->Med Rx.  . Chronic combined systolic and diastolic CHF (congestive heart failure) ([place])    a. 12/2014 Echo: EF 45-50%;  b. 08/2015 Echo: EF 45-50%, ant, antsept HK, mildly dil LA, nl RV, mild-mod TR, sev PAH (23mmHg); b. 08/2017 TEE: EF 40-45%, large PFO w/ L->R shunting.  . CKD (chronic kidney disease), stage IV ([place])   . CLL (chronic lymphocytic leukemia) ([place])   . Diabetes mellitus without complication ([place])   . Essential hypertension   . GERD (gastroesophageal reflux disease)   . Hiatal hernia   . History of cardiac  arrest    a. 2004.  Marland Kitchen Hyperlipidemia   . Ischemic cardiomyopathy    a. s/p MDT ICD (originally had 5400 lead-->gen change and lead revision ~ 2012 @ [place]); b. 12/2014 Echo: EF 45-50%;  c.08/2015 Echo: EF 45-50%; d. 08/2017 Echo: EF 40-45%.  Marland Kitchen PAD (peripheral artery disease) (H[facility])    a. s/p bilat BKA  . Persistent atrial fibrillation    a. Dx 12/2014.  CHA2DS2VASc = 6--> warfarin;  b. 09/2015 s/p DCCV-->on amio; c. s/p DCCV 04/25/16; d. 07/2016 s/p RFCA/PVI in setting of recurrent afib despite amio; e. 05/2018 Recurrent afib.    Medications:  Scheduled:  . albuterol  2.5 mg Nebulization TID  . amiodarone  400 mg Oral BID  . amitriptyline  25 mg Oral QHS  . amLODipine  10 mg Oral Daily  . atorvastatin  40 mg Oral QHS  . budesonide (PULMICORT) nebulizer solution  0.5 mg Nebulization BID  . carvedilol  3.125 mg Oral Daily  . cephALEXin  250 mg Oral Q12H  . Chlorhexidine Gluconate Cloth  6 each Topical Q0600  . diphenhydrAMINE  50 mg Intravenous Once  . gabapentin  100 mg Oral BID  . Gerhardt's butt cream   Topical BID  . insulin aspart  0-15 Units Subcutaneous TID WC  . insulin aspart  0-5 Units Subcutaneous  QHS  . insulin aspart  5 Units Subcutaneous TID WC  . insulin glargine  20 Units Subcutaneous BID  . isosorbide mononitrate  30 mg Oral Daily  . lactulose  20 g Oral BID  . mupirocin ointment  1 application Nasal BID  . pantoprazole  40 mg Oral QHS  . predniSONE  40 mg Oral Q breakfast  . senna  1 tablet Oral Daily  . sertraline  100 mg Oral QHS  . sodium chloride flush  3 mL Intravenous Q12H   Infusions:  . sodium chloride 500 mL (10/21/18 2347)  . sodium chloride    . heparin      Assessment: Patient previously being anticoagulated with warfarin.  Patient now being transitioned to heparin infusion due to planned procedures of Permcath placement and peritoneal dialysis access.  Due to this vascular surgery is planning to give patient Vitamin K 5 mg PO once for warfarin  reversal with INR 2.14.  Per discussion with Marcelle Overlie, PA-C will start heparin drip without bolus.  Typical transition would be initiated when INR is a lower limit of goal range without bolus and dosed per indication.  Goal of Therapy:  Heparin level 0.3-0.7 units/ml Monitor platelets by anticoagulation protocol: Yes   Plan:  02/11 @ 0100 Patient had not had heparin drip running until now. Patient was having IV access issues, drip being started now at 0100 will retime HL for 0900.  Tobie Lords, PharmD Clinical Pharmacist 10/22/2018,12:59 AM

## 2018-10-22 NOTE — Progress Notes (Signed)
[place] [facility] [place] NAME: Caroline Hernandez    MR#:  [id number]  DATE OF BIRTH:  02-Feb-1946  SUBJECTIVE:  Patient seen and evaluated today Renal Function has worsened No fever No chest pain On oxygen via nasal cannula at 2 L  REVIEW OF SYSTEMS:    Review of Systems  Constitutional: Positive for malaise/fatigue. Negative for chills and fever.  HENT: Negative for sore throat.   Eyes: Negative for blurred vision, double vision and pain.  Respiratory: Positive for cough, shortness of breath and wheezing. Negative for hemoptysis.   Cardiovascular: Negative for palpitations, orthopnea and leg swelling.  Gastrointestinal: Negative for abdominal pain, constipation, diarrhea, heartburn, nausea and vomiting.  Genitourinary: Negative for dysuria and hematuria.  Musculoskeletal: Negative for back pain and joint pain.  Skin: Negative for rash.  Neurological: Negative for sensory change, speech change, focal weakness and headaches.  Endo/Heme/Allergies: Does not bruise/bleed easily.  Psychiatric/Behavioral: Negative for depression. The patient is not nervous/anxious.     DRUG ALLERGIES:   Allergies  Allergen Reactions  . Piperacillin Other (See Comments)    Renal failure  . 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 See ID note**PER PT-CAUSED RENAL FAILURE**  . Zosyn [Piperacillin Sod-Tazobactam So] Other (See Comments)    Renal failure    VITALS:  Blood pressure (!) 141/76, pulse 60, temperature 97.7 F (36.5 C), resp. rate 18, height 5\' 8"  (1.727 m), weight 112 kg, SpO2 99 %.  PHYSICAL EXAMINATION:   Physical Exam  GENERAL:  73 y.o.-year-old patient lying in the bed on oxygen via nasal cannula. Unable to speak in full sentences without shortness of breath EYES: Pupils equal, round, reactive to light and accommodation. No scleral icterus. Extraocular muscles intact.  HEENT: Head atraumatic,  normocephalic. Oropharynx and nasopharynx clear.  NECK:  Supple, no jugular venous distention. No thyroid enlargement, no tenderness.  LUNGS: Scattered wheezing, decreased coarse breath sounds Bilateral good airflow CARDIOVASCULAR: S1, S2 normal. No murmurs, rubs, or gallops.  ABDOMEN: Soft, nontender, nondistended. Bowel sounds present. No organomegaly or mass.  EXTREMITIES: No cyanosis, clubbing or edema b/l.   Bilateral below-knee amputations NEUROLOGIC: Cranial nerves II through XII are intact. No focal Motor or sensory deficits b/l.   PSYCHIATRIC: The patient is alert and oriented x 3.  SKIN: No obvious rash, lesion, or ulcer.   LABORATORY PANEL:   CBC Recent Labs  Lab 10/22/18 0433  WBC 36.1*  HGB 11.1*  HCT 35.3*  PLT 130*   ------------------------------------------------------------------------------------------------------------------ Chemistries  Recent Labs  Lab 10/17/18 0530  10/22/18 0433  NA 135   < > 132*  K 4.2   < > 4.0  CL 100   < > 98  CO2 25   < > 23  GLUCOSE 95   < > 209*  BUN 52*   < > 114*  CREATININE 3.60*   < > 4.19*  CALCIUM 8.5*   < > 8.2*  MG  --   --  2.7*  AST 45*  --   --   ALT 25  --   --   ALKPHOS 67  --   --   BILITOT 0.8  --   --    < > = values in this interval not displayed.   ------------------------------------------------------------------------------------------------------------------  Cardiac Enzymes Recent Labs  Lab 10/17/18 0530  TROPONINI 0.10*   ------------------------------------------------------------------------------------------------------------------  RADIOLOGY:  US Renal  Result Date: 10/20/2018 CLINICAL DATA:  Acute renal  failure EXAM: RENAL / URINARY TRACT ULTRASOUND COMPLETE COMPARISON:  None. FINDINGS: Right Kidney: Renal measurements: 8.9 x 4.4 x 5.5 cm = volume: 113 mL . Echogenicity within normal limits. No mass or hydronephrosis visualized. Left Kidney: Renal measurements: 11.4 x 7.2 x 5.7 cm =  volume: 250 mL. Echogenicity within normal limits. No mass or hydronephrosis visualized. Bladder: Poorly visualized/decompressed by indwelling Foley catheter. IMPRESSION: No hydronephrosis. Bladder decompressed by indwelling Foley catheter. Electronically Signed   By: Julian Hy M.D.   On: 10/20/2018 17:19     ASSESSMENT AND PLAN:   *Acute bronchitis with acute hypoxic respiratory failure - oral steroids to continue - Scheduled Nebulizers - Inhalers -Wean O2 as tolerated -Respiratory panel with Metapneumovirus. -Wean oxygen as tolerated  *Acute kidney injury over CKD stage III worsened Creatinine has worsened 4.91 Dialysis has been planned Access catheter to be placed by vascular surgery today Nephrology f/u Monitor renal function  *Acute on chronic systolic congestive heart failure.  Likely decompensated secondary to bronchitis.   No signs of fluid overload at this time.   *UTI with leukocytosis.  Klebsiella in urine cultures.  Continue oral Keflex antibiotic  *Chronic atrial fibrillation.  Continue Coumadin, amiodarone and Coreg  *Diabetes mellitus type 2.  On sliding scale insulin along with Lantus insulin Monitor blood sugars closely to avoid hypoglycemia  *Leukocytosis No evidence of any sepsis Could be from leukemia Could be from steroids Currently on antibiotic We will follow-up the trend  All the records are reviewed and case discussed with Care Management/Social Worker Management plans discussed with the patient, family and they are in agreement.  CODE STATUS: Full code  DVT Prophylaxis: SCDs  TOTAL TIME TAKING CARE OF THIS PATIENT: 37 minutes.   POSSIBLE D/C IN 1-2 DAYS, DEPENDING ON CLINICAL CONDITION.  Saundra Shelling M.D on 10/22/2018 at 71:50 PM  Between 7am to 6pm - Pager - [phone]  After 6pm go to [email or web address] - password [email or web address] [facility]  Office  [phone]  CC: Primary care physician; System, Provider Not  In  Note: This dictation was prepared with Dragon dictation along with smaller phrase technology. Any transcriptional errors that result from this process are unintentional.

## 2018-10-23 ENCOUNTER — Encounter: Payer: Self-pay | Admitting: Vascular Surgery

## 2018-10-23 ENCOUNTER — Ambulatory Visit: Payer: Medicare HMO | Admitting: Family

## 2018-10-23 DIAGNOSIS — I5043 Acute on chronic combined systolic (congestive) and diastolic (congestive) heart failure: Secondary | ICD-10-CM

## 2018-10-23 DIAGNOSIS — C9110 Chronic lymphocytic leukemia of B-cell type not having achieved remission: Secondary | ICD-10-CM

## 2018-10-23 DIAGNOSIS — Z881 Allergy status to other antibiotic agents status: Secondary | ICD-10-CM

## 2018-10-23 DIAGNOSIS — Z992 Dependence on renal dialysis: Secondary | ICD-10-CM

## 2018-10-23 DIAGNOSIS — I4819 Other persistent atrial fibrillation: Secondary | ICD-10-CM

## 2018-10-23 DIAGNOSIS — N189 Chronic kidney disease, unspecified: Secondary | ICD-10-CM

## 2018-10-23 DIAGNOSIS — R05 Cough: Secondary | ICD-10-CM

## 2018-10-23 DIAGNOSIS — Z79899 Other long term (current) drug therapy: Secondary | ICD-10-CM

## 2018-10-23 DIAGNOSIS — Z794 Long term (current) use of insulin: Secondary | ICD-10-CM

## 2018-10-23 DIAGNOSIS — N179 Acute kidney failure, unspecified: Secondary | ICD-10-CM

## 2018-10-23 DIAGNOSIS — Z89512 Acquired absence of left leg below knee: Secondary | ICD-10-CM

## 2018-10-23 DIAGNOSIS — Z89511 Acquired absence of right leg below knee: Secondary | ICD-10-CM

## 2018-10-23 DIAGNOSIS — I509 Heart failure, unspecified: Secondary | ICD-10-CM

## 2018-10-23 DIAGNOSIS — I4891 Unspecified atrial fibrillation: Secondary | ICD-10-CM

## 2018-10-23 DIAGNOSIS — N390 Urinary tract infection, site not specified: Secondary | ICD-10-CM

## 2018-10-23 DIAGNOSIS — I2510 Atherosclerotic heart disease of native coronary artery without angina pectoris: Secondary | ICD-10-CM

## 2018-10-23 DIAGNOSIS — I130 Hypertensive heart and chronic kidney disease with heart failure and stage 1 through stage 4 chronic kidney disease, or unspecified chronic kidney disease: Secondary | ICD-10-CM

## 2018-10-23 DIAGNOSIS — J449 Chronic obstructive pulmonary disease, unspecified: Secondary | ICD-10-CM

## 2018-10-23 DIAGNOSIS — R8271 Bacteriuria: Secondary | ICD-10-CM

## 2018-10-23 DIAGNOSIS — Z7901 Long term (current) use of anticoagulants: Secondary | ICD-10-CM

## 2018-10-23 DIAGNOSIS — Z95810 Presence of automatic (implantable) cardiac defibrillator: Secondary | ICD-10-CM

## 2018-10-23 DIAGNOSIS — N184 Chronic kidney disease, stage 4 (severe): Secondary | ICD-10-CM

## 2018-10-23 DIAGNOSIS — R0602 Shortness of breath: Secondary | ICD-10-CM

## 2018-10-23 DIAGNOSIS — E1122 Type 2 diabetes mellitus with diabetic chronic kidney disease: Secondary | ICD-10-CM

## 2018-10-23 DIAGNOSIS — R531 Weakness: Secondary | ICD-10-CM

## 2018-10-23 DIAGNOSIS — Z951 Presence of aortocoronary bypass graft: Secondary | ICD-10-CM

## 2018-10-23 LAB — CBC
HCT: 37 % (ref 36.0–46.0)
Hemoglobin: 11.1 g/dL — ABNORMAL LOW (ref 12.0–15.0)
MCH: 25.5 pg — ABNORMAL LOW (ref 26.0–34.0)
MCHC: 30 g/dL (ref 30.0–36.0)
MCV: 85.1 fL (ref 80.0–100.0)
Platelets: 133 10*3/uL — ABNORMAL LOW (ref 150–400)
RBC: 4.35 MIL/uL (ref 3.87–5.11)
RDW: 18.4 % — AB (ref 11.5–15.5)
WBC: 41.7 10*3/uL — AB (ref 4.0–10.5)
nRBC: 0.1 % (ref 0.0–0.2)

## 2018-10-23 LAB — BASIC METABOLIC PANEL
ANION GAP: 12 (ref 5–15)
BUN: 117 mg/dL — ABNORMAL HIGH (ref 8–23)
CALCIUM: 8.3 mg/dL — AB (ref 8.9–10.3)
CO2: 20 mmol/L — ABNORMAL LOW (ref 22–32)
Chloride: 102 mmol/L (ref 98–111)
Creatinine, Ser: 3.87 mg/dL — ABNORMAL HIGH (ref 0.44–1.00)
GFR calc non Af Amer: 11 mL/min — ABNORMAL LOW (ref >60)
GFR, EST AFRICAN AMERICAN: 13 mL/min — AB (ref >60)
Glucose, Bld: 185 mg/dL — ABNORMAL HIGH (ref 70–99)
Potassium: 4.4 mmol/L (ref 3.5–5.1)
Sodium: 134 mmol/L — ABNORMAL LOW (ref 135–145)

## 2018-10-23 LAB — HEPATITIS B CORE ANTIBODY, IGM: Hep B C IgM: NEGATIVE

## 2018-10-23 LAB — GLUCOSE, CAPILLARY
Glucose-Capillary: 191 mg/dL — ABNORMAL HIGH (ref 70–99)
Glucose-Capillary: 166 mg/dL — ABNORMAL HIGH (ref 70–99)
Glucose-Capillary: 132 mg/dL — ABNORMAL HIGH (ref 70–99)
Glucose-Capillary: 163 mg/dL — ABNORMAL HIGH (ref 70–99)

## 2018-10-23 LAB — HEPATITIS B SURFACE ANTIBODY,QUALITATIVE: Hep B S Ab: NONREACTIVE

## 2018-10-23 LAB — HEPATITIS C ANTIBODY: HCV Ab: <0.1 s/co ratio (ref 0.0–0.9)

## 2018-10-23 LAB — PROTIME-INR
INR: 1.26
Prothrombin Time: 15.7 seconds — ABNORMAL HIGH (ref 11.4–15.2)

## 2018-10-23 LAB — HEPARIN LEVEL (UNFRACTIONATED): Heparin Unfractionated: 1.06 IU/mL — ABNORMAL HIGH (ref 0.30–0.70)

## 2018-10-23 LAB — HEPATITIS B SURFACE ANTIGEN: Hepatitis B Surface Ag: NEGATIVE

## 2018-10-23 MED ORDER — WARFARIN SODIUM 6 MG PO TABS
6.0000 mg | ORAL_TABLET | Freq: Once | ORAL | Status: DC
  Filled 2018-10-23: qty 1

## 2018-10-23 MED ORDER — WARFARIN SODIUM 4 MG PO TABS
4.0000 mg | ORAL_TABLET | Freq: Once | ORAL | Status: AC
  Administered 2018-10-23: 4 mg via ORAL
  Filled 2018-10-23: qty 1

## 2018-10-23 MED ORDER — WARFARIN - PHARMACIST DOSING INPATIENT
Freq: Every day | Status: DC
  Administered 2018-10-23: 18:00:00

## 2018-10-23 NOTE — NC FL2 (Signed)
[place] LEVEL OF CARE SCREENING TOOL     IDENTIFICATION  Patient Name: Caroline Hernandez Birthdate: 28-Jan-1946 Sex: female Admission Date (Current Location): 10/16/2018  Startup and [place] Number:  Engineering geologist and Address:  [facility], [address], [place], [place] [address]      Provider Number: [id number]  Attending Physician Name and Address:  Saundra Shelling, MD  Relative Name and Phone Number:  Wall,Jennifer Daughter [phone]  [phone]     Current Level of Care: Hospital Recommended Level of Care: [place] Prior Approval Number:    Date Approved/Denied:   PASRR Number: [id number]  Discharge Plan: SNF    Current Diagnoses: Patient Active Problem List   Diagnosis Date Noted  . Pressure injury of skin 10/19/2018  . CHF (congestive heart failure) ([place]) 10/16/2018  . CAP (community acquired pneumonia) 04/14/2018  . UTI (urinary tract infection) 03/07/2018  . A-fib ([place]) 07/18/2016  . Chronic diastolic CHF (congestive heart failure) ([place])   . SOB (shortness of breath)   . Chronic systolic CHF (congestive heart failure) ([place]) 08/20/2015  . CKD (chronic kidney disease), stage IV ([place])   . Ischemic cardiomyopathy   . Chronic combined systolic and diastolic CHF (congestive heart failure) ([place])   . Long term (current) use of anticoagulants [Z79.01] 06/23/2015  . Essential hypertension 06/18/2015  . Systolic and diastolic CHF, acute on chronic ([place])   . Acute on chronic renal failure ([place])   . Angina pectoris ([place])   . Coronary artery disease involving native coronary artery of native heart with angina pectoris with documented spasm ([place])   . 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 Persistent atrial fibrillation   . Bradycardia   . Chest pain 06/12/2015    Orientation RESPIRATION BLADDER Height & Weight     Self, Time, Situation, Place  O2(3L) Incontinent Weight:  246 lb 7.6 oz (111.8 kg) Height:  5\' 8"  (172.7 cm)  BEHAVIORAL SYMPTOMS/MOOD NEUROLOGICAL BOWEL NUTRITION STATUS      Incontinent Diet(Fluid Restriction renal diet)  AMBULATORY STATUS COMMUNICATION OF NEEDS Skin   Limited Assist Verbally PU Stage and Appropriate Care PU Stage 1 Dressing: BID                     Personal Care Assistance Level of Assistance  Bathing, Feeding, Dressing Bathing Assistance: Limited assistance Feeding assistance: Independent Dressing Assistance: Limited assistance     Functional Limitations Info  Sight, Hearing, Speech Sight Info: Adequate Hearing Info: Adequate Speech Info: Adequate    SPECIAL CARE FACTORS FREQUENCY  PT (By licensed PT), OT (By licensed OT)     PT Frequency: minimum 5x a week OT Frequency: minimum 5x a week            Contractures Contractures Info: Not present    Additional Factors Info  Code Status, Allergies, Insulin Sliding Scale, Psychotropic, Isolation Precautions Code Status Info: Full Code Allergies Info: PIPERACILLIN, PIPERACILLIN-TAZOBACTAM IN DEX, ZOSYN PIPERACILLIN SOD-TAZOBACTAM SO  Psychotropic Info: sertraline (ZOLOFT) tablet 100 mg  Insulin Sliding Scale Info: insulin aspart (novoLOG) injection 0-15 Units 3x a day with meals Isolation Precautions Info: Contact precautions for history of MRSA     Current Medications (10/23/2018):  This is the current hospital active medication list Current Facility-Administered Medications  Medication Dose Route Frequency Provider Last Rate Last Dose  . 0.9 %  sodium chloride infusion   Intravenous PRN Schnier, Dolores Lory, MD  Stopped at 10/22/18 0104  . acetaminophen (TYLENOL) tablet 650 mg  650 mg Oral Q6H PRN Schnier, Dolores Lory, MD       Or  . acetaminophen (TYLENOL) suppository 650 mg  650 mg Rectal Q6H PRN Schnier, Dolores Lory, MD      . albuterol (PROVENTIL) (2.5 MG/3ML) 0.083% nebulizer solution 2.5 mg  2.5 mg Nebulization TID Delana Meyer Dolores Lory, MD   2.5 mg at  10/23/18 1310  . amiodarone (PACERONE) tablet 400 mg  400 mg Oral BID Katha Cabal, MD   400 mg at 10/23/18 0946  . amitriptyline (ELAVIL) tablet 25 mg  25 mg Oral QHS Schnier, Dolores Lory, MD   25 mg at 10/22/18 2250  . amLODipine (NORVASC) tablet 10 mg  10 mg Oral Daily Schnier, Dolores Lory, MD   10 mg at 10/22/18 0905  . atorvastatin (LIPITOR) tablet 40 mg  40 mg Oral QHS Schnier, Dolores Lory, MD   40 mg at 10/22/18 2250  . budesonide (PULMICORT) nebulizer solution 0.5 mg  0.5 mg Nebulization BID Schnier, Dolores Lory, MD   0.5 mg at 10/23/18 0755  . carvedilol (COREG) tablet 3.125 mg  3.125 mg Oral Daily Schnier, Dolores Lory, MD   3.125 mg at 10/22/18 0905  . Chlorhexidine Gluconate Cloth 2 % PADS 6 each  6 each Topical Q0600 Schnier, Dolores Lory, MD   6 each at 10/23/18 [phone]  . feeding supplement (NEPRO CARB STEADY) liquid 237 mL  237 mL Oral BID BM Schnier, Dolores Lory, MD   237 mL at 10/23/18 0948  . gabapentin (NEURONTIN) capsule 100 mg  100 mg Oral BID Delana Meyer Dolores Lory, MD   100 mg at 10/23/18 0947  . Gerhardt's butt cream   Topical BID Schnier, Dolores Lory, MD      . guaiFENesin-dextromethorphan [facility] DM) 100-10 MG/5ML syrup 5 mL  5 mL Oral Q4H PRN Schnier, Dolores Lory, MD   5 mL at 10/22/18 2253  . hydrALAZINE (APRESOLINE) injection 5 mg  5 mg Intravenous Q6H PRN Schnier, Dolores Lory, MD      . HYDROmorphone (DILAUDID) injection 1 mg  1 mg Intravenous Once PRN Schnier, Dolores Lory, MD      . hydrOXYzine (ATARAX/VISTARIL) tablet 25 mg  25 mg Oral TID PRN Delana Meyer Dolores Lory, MD   25 mg at 10/21/18 2156  . insulin aspart (novoLOG) injection 0-15 Units  0-15 Units Subcutaneous TID WC Schnier, Dolores Lory, MD   3 Units at 10/23/18 [phone]  . insulin aspart (novoLOG) injection 0-5 Units  0-5 Units Subcutaneous QHS Schnier, Dolores Lory, MD   2 Units at 10/22/18 2254  . insulin aspart (novoLOG) injection 5 Units  5 Units Subcutaneous TID WC Schnier, Dolores Lory, MD   5 Units at 10/23/18 [phone]  . insulin glargine  (LANTUS) injection 20 Units  20 Units Subcutaneous BID Schnier, Dolores Lory, MD   20 Units at 10/23/18 [phone]  . ipratropium (ATROVENT) nebulizer solution 0.5 mg  0.5 mg Nebulization Q6H PRN Schnier, Dolores Lory, MD   0.5 mg at 10/19/18 2338  . isosorbide mononitrate (IMDUR) 24 hr tablet 30 mg  30 mg Oral Daily Schnier, Dolores Lory, MD   30 mg at 10/23/18 0947  . lactulose (CHRONULAC) 10 GM/15ML solution 20 g  20 g Oral BID Schnier, Dolores Lory, MD   20 g at 10/23/18 0948  . multivitamin (RENA-VIT) tablet 1 tablet  1 tablet Oral QHS Schnier, Dolores Lory, MD      . nitroGLYCERIN (  NITROSTAT) SL tablet 0.4 mg  0.4 mg Sublingual Q5 min PRN Schnier, Dolores Lory, MD      . ondansetron [facility]) tablet 4 mg  4 mg Oral Q6H PRN Schnier, Dolores Lory, MD       Or  . ondansetron [facility]) injection 4 mg  4 mg Intravenous Q6H PRN Schnier, Dolores Lory, MD   4 mg at 10/16/18 1334  . pantoprazole (PROTONIX) EC tablet 40 mg  40 mg Oral QHS Schnier, Dolores Lory, MD   40 mg at 10/22/18 2250  . senna (SENOKOT) tablet 8.6 mg  1 tablet Oral Daily Schnier, Dolores Lory, MD   8.6 mg at 10/22/18 0857  . sertraline (ZOLOFT) tablet 100 mg  100 mg Oral QHS Schnier, Dolores Lory, MD   100 mg at 10/22/18 2250  . sodium chloride flush (NS) 0.9 % injection 3 mL  3 mL Intravenous Q12H Schnier, Dolores Lory, MD   3 mL at 10/22/18 2257  . traMADol (ULTRAM) tablet 50 mg  50 mg Oral Q12H PRN Schnier, Dolores Lory, MD   50 mg at 10/17/18 1312  . warfarin (COUMADIN) tablet 4 mg  4 mg Oral ONCE-1800 ShanleverPierce Crane, [facility]      . Warfarin - Pharmacist Dosing Inpatient   Does not apply [place], [place], [facility]         Discharge Medications: Please see discharge summary for a list of discharge medications.  Relevant Imaging Results:  Relevant Lab Results:   Additional Information SSN [id number]  Patient is a new dialysis as well, not sure where she will be going yet, or when she will be schudled.  Raylie Maddison, [place], LCSWA

## 2018-10-23 NOTE — [place] Notes (Signed)
Pre Tx Assessment   10/23/18 1420  Neurological  Level of Consciousness Alert  Orientation Level Oriented X4  Respiratory  Respiratory Pattern Labored;Irregular  Bilateral Breath Sounds Rhonchi;Coarse crackles;Diminished  Cough Non-productive;Strong  Cardiac  Pulse Regular  Heart Sounds S1, S2  ECG Monitor Yes  Cardiac Rhythm Atrial fibrillation  Vascular  R Radial Pulse +2  L Radial Pulse +2  Edema Generalized;Facial  Integumentary  Integumentary (WDL) X  Skin Color Appropriate for ethnicity  Musculoskeletal  Musculoskeletal (WDL) X  Generalized Weakness  (hemodialysis access)  GU Assessment  Genitourinary (WDL) WDL  Psychosocial  Psychosocial (WDL) WDL

## 2018-10-23 NOTE — Progress Notes (Signed)
[facility]  ROUNDING NOTE   Subjective:   Due to scheduling conflict, patient did not get tunneled dialysis catheter until late and initial hemodialysis treatment postponed until later today.   Patient states she is doing well and has several questions of about hemodialysis.   Objective:  Vital signs in last 24 hours:  Temp:  [97.5 F (36.4 C)-97.6 F (36.4 C)] 97.5 F (36.4 C) (02/12 0809) Pulse Rate:  [53-118] 118 (02/12 0809) Resp:  [18-20] 20 (02/12 0517) BP: (118-155)/(63-96) 118/96 (02/12 0809) SpO2:  [46 %-99 %] 99 % (02/12 0809) Weight:  [111.8 kg] 111.8 kg (02/12 0517)  Weight change: -0.2 kg Filed Weights   10/21/18 0607 10/22/18 0550 10/23/18 0517  Weight: 112.1 kg 112 kg 111.8 kg    Intake/Output: I/O last 3 completed shifts: In: 98.9 [I.V.:48.9; IV Piggyback:50] Out: 3846 [Urine:1650]   Intake/Output this shift:  Total I/O In: -  Out: 100 [Urine:100]  Physical Exam: General: No acute distress  Head: Normocephalic, atraumatic. Moist oral mucosal membranes  Eyes: Anicteric  Neck: Supple, trachea midline  Lungs:  Scattered rhonchi, normal effort  Heart: S1S2 no rubs  Abdomen:  Soft, nontender, bowel sounds present  Extremities: B/L BKA  Neurologic: Awake, alert, following commands  Skin: No lesions  Access:  RIJ permcath 2/12 Dr. Delana Meyer    Basic Metabolic Panel: Recent Labs  Lab 10/19/18 0507 10/20/18 0244 10/21/18 0426 10/22/18 0433 10/23/18 0726  NA 130* 132* 131* 132* 134*  K 4.3 4.1 4.0 4.0 4.4  CL 96* 97* 97* 98 102  CO2 23 23 23 23  20*  GLUCOSE 309* 305* 310* 209* 185*  BUN 86* 98* 111* 114* 117*  CREATININE 3.97* 3.98* 4.27* 4.19* 3.87*  CALCIUM 8.7* 8.4* 8.4* 8.2* 8.3*  MG  --   --   --  2.7*  --   PHOS  --   --   --  5.5*  --     Liver Function Tests: Recent Labs  Lab 10/17/18 0530 10/22/18 0433  AST 45*  --   ALT 25  --   ALKPHOS 67  --   BILITOT 0.8  --   PROT 7.0  --   ALBUMIN 3.7 3.6   No results  for input(s): LIPASE, AMYLASE in the last 168 hours. No results for input(s): AMMONIA in the last 168 hours.  CBC: Recent Labs  Lab 10/17/18 0530 10/21/18 1658 10/22/18 0433 10/23/18 0726  WBC 14.7* 31.0* 36.1* 41.7*  HGB 11.2* 11.4* 11.1* 11.1*  HCT 36.9 36.7 35.3* 37.0  MCV 83.5 81.0 80.6 85.1  PLT 96* 123* 130* 133*    Cardiac Enzymes: Recent Labs  Lab 10/16/18 1614 10/17/18 0530  TROPONINI 0.12* 0.10*    BNP: Invalid input(s): POCBNP  CBG: Recent Labs  Lab 10/22/18 0802 10/22/18 1147 10/22/18 2140 10/23/18 0806 10/23/18 1143  GLUCAP 173* 160* 206* 191* 166*    Microbiology: Results for orders placed or performed during the hospital encounter of 10/16/18  Respiratory Panel by PCR     Status: Abnormal   Collection Time: 10/16/18  4:05 AM  Result Value Ref Range Status   Adenovirus NOT DETECTED NOT DETECTED Final   Coronavirus 229E NOT DETECTED NOT DETECTED Final    Comment: (NOTE) The Coronavirus on the Respiratory Panel, DOES NOT test for the novel  Coronavirus (2019 nCoV)    Coronavirus HKU1 NOT DETECTED NOT DETECTED Final   Coronavirus NL63 NOT DETECTED NOT DETECTED Final   Coronavirus OC43 NOT DETECTED  NOT DETECTED Final   Metapneumovirus DETECTED (A) NOT DETECTED Final   Rhinovirus / Enterovirus NOT DETECTED NOT DETECTED Final   Influenza A NOT DETECTED NOT DETECTED Final   Influenza B NOT DETECTED NOT DETECTED Final   Parainfluenza Virus 1 NOT DETECTED NOT DETECTED Final   Parainfluenza Virus 2 NOT DETECTED NOT DETECTED Final   Parainfluenza Virus 3 NOT DETECTED NOT DETECTED Final   Parainfluenza Virus 4 NOT DETECTED NOT DETECTED Final   Respiratory Syncytial Virus NOT DETECTED NOT DETECTED Final   Bordetella pertussis NOT DETECTED NOT DETECTED Final   Chlamydophila pneumoniae NOT DETECTED NOT DETECTED Final   Mycoplasma pneumoniae NOT DETECTED NOT DETECTED Final    Comment: Performed at [facility], [place] [address], [place], [place]  [address]  Urine culture     Status: Abnormal   Collection Time: 10/16/18  4:23 AM  Result Value Ref Range Status   Specimen Description URINE, CLEAN CATCH  Final   Special Requests   Final    NONE Performed at [facility], [address] [place], [place] [address]    Culture >=100,000 COLONIES/mL KLEBSIELLA PNEUMONIAE (A)  Final   Report Status 10/19/2018 FINAL  Final   Organism ID, Bacteria KLEBSIELLA PNEUMONIAE (A)  Final      Susceptibility   Klebsiella pneumoniae - MIC*    AMPICILLIN RESISTANT Resistant     CEFAZOLIN <=4 SENSITIVE Sensitive     CEFTRIAXONE <=1 SENSITIVE Sensitive     CIPROFLOXACIN <=0.25 SENSITIVE Sensitive     GENTAMICIN <=1 SENSITIVE Sensitive     IMIPENEM <=0.25 SENSITIVE Sensitive     NITROFURANTOIN <=16 SENSITIVE Sensitive     TRIMETH/SULFA <=20 SENSITIVE Sensitive     AMPICILLIN/SULBACTAM 4 SENSITIVE Sensitive     PIP/TAZO <=4 SENSITIVE Sensitive     Extended ESBL NEGATIVE Sensitive     * >=100,000 COLONIES/mL KLEBSIELLA PNEUMONIAE  MRSA PCR Screening     Status: Abnormal   Collection Time: 10/16/18  5:13 PM  Result Value Ref Range Status   MRSA by PCR POSITIVE (A) NEGATIVE Final    Comment:        The GeneXpert MRSA Assay (FDA approved for NASAL specimens only), is one component of a comprehensive MRSA colonization surveillance program. It is not intended to diagnose MRSA infection nor to guide or monitor treatment for MRSA infections. RESULT CALLED TO, READ BACK BY AND VERIFIED WITH: MAI QUIARORO 10/16/18 @ 1838  [facility] Performed at [facility], [address] [place], [place] [address]     Coagulation Studies: Recent Labs    10/21/18 0426 10/22/18 0433 10/23/18 0726  LABPROT 23.6* 19.3* 15.7*  INR 2.14 1.65 1.26    Urinalysis: No results for input(s): COLORURINE, LABSPEC, PHURINE, GLUCOSEU, HGBUR, BILIRUBINUR, KETONESUR, PROTEINUR, UROBILINOGEN, NITRITE, LEUKOCYTESUR in the last 72 hours.  Invalid input(s):  APPERANCEUR    Imaging: No results found.   Medications:   . sodium chloride Stopped (10/22/18 0104)  . heparin 1,000 Units/hr (10/23/18 1001)   . albuterol  2.5 mg Nebulization TID  . amiodarone  400 mg Oral BID  . amitriptyline  25 mg Oral QHS  . amLODipine  10 mg Oral Daily  . atorvastatin  40 mg Oral QHS  . budesonide (PULMICORT) nebulizer solution  0.5 mg Nebulization BID  . carvedilol  3.125 mg Oral Daily  . Chlorhexidine Gluconate Cloth  6 each Topical Q0600  . feeding supplement (NEPRO CARB STEADY)  237 mL Oral BID BM  .  gabapentin  100 mg Oral BID  . Gerhardt's butt cream   Topical BID  . insulin aspart  0-15 Units Subcutaneous TID WC  . insulin aspart  0-5 Units Subcutaneous QHS  . insulin aspart  5 Units Subcutaneous TID WC  . insulin glargine  20 Units Subcutaneous BID  . isosorbide mononitrate  30 mg Oral Daily  . lactulose  20 g Oral BID  . multivitamin  1 tablet Oral QHS  . pantoprazole  40 mg Oral QHS  . predniSONE  40 mg Oral Q breakfast  . senna  1 tablet Oral Daily  . sertraline  100 mg Oral QHS  . sodium chloride flush  3 mL Intravenous Q12H   sodium chloride, acetaminophen **OR** acetaminophen, guaiFENesin-dextromethorphan, hydrALAZINE, HYDROmorphone (DILAUDID) injection, hydrOXYzine, ipratropium, nitroGLYCERIN, ondansetron **OR** ondansetron (ZOFRAN) IV, traMADol  Assessment/ Plan:  [name] is a 73 y.o. [place] female with atrial fibrillation, hypertension, diabetes mellitus type II insulin dependent, diabetic neuropathy, congestive heart failure, coronary artery disease status post CABG, GERD, CLL, bilateral below the knee amputations admitted with cough, shortness of breath, UTI and now with acute renal failure with superimposed chronic kidney disease stage IV.  1.  End Stage Renal Disease with hyponatremia and uremia.  Discussed dialysis with patient.  Dialysis for later today. First treatment Outpatient planning for [facility]   Patient wants to eventually do peritoneal dialysis at home. PD catheter scheduled to be placed on Friday, 2/14.   2.  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  Hemoglobin 11.1. Thrombocytopenia    3.  Urinary tract infection. Klebsiella - cephalexin  4. Diabetes mellitus type II with chronic kidney disease:insulin dependent. history of poor control.   5. Hypertension:  - amlodipine.    LOS: 7 Caroline Hernandez 2/12/202012:16 PM

## 2018-10-23 NOTE — Consult Note (Signed)
NAME: Caroline Hernandez  DOB: [birth year]  MRN: [id number]  Date/Time: 10/23/2018 5:58 PM  REQUESTING PROVIDER:Dr.Pyreddy Subjective:  REASON FOR CONSULT: worsening leucocytosis ? Caroline Hernandez is a 73 y.o. female with a history of CLL, CAD, status post CABG in 2 3, CKD, diabetes mellitus, hypertension, CHF, ICD, A. fib, bilateral amputee presented to the ED on 10/16/2018 via EMS with complaints of weakness and coughing which was nonproductive cough.  Patient was also stating that she had a strong smell of urine.  In the ED her vitals were blood pressure of 164/83, heart rate of 78, temperature of 99.2, sats of 91% on room air.  Chest x-ray revealed left-sided pacemaker, stable cardiomegaly, no focal infiltrates in the lung, mild perihilar vascular congestion without overt pulmonary edema or pleural effusion.  WBC was 16.7, Hb of 12, platelet of 103, creatinine of 3.01 and BNP of 1500.  She had a catheter urine sent for culture and UA.  The UA showed WBC of more than 50, RBC of 21-50.  Urine culture showed Klebsiella pneumoniae pansensitive except for ampicillin.  She was initially given ceftriaxone from 10/16/2018 till 10/21/2018 and later that was changed to Keflex orally .  She was seen by nephrologist because of her abnormal creatinine and has had a dialysis catheter placed by Dr. Franchot Gallo on 10/22/2018 and underwent dialysis today I am asked to see the patient for leuccream sputum which ahs been present for nearly 3 weeks. She says her wight went up by 20 pounds and her cardiologist prescribed toresemide and that made her swelling go down but her kidney function worsened. She does not have dysuria or abdominal pain. Had told the staff that her urine had a smell and they did a straight cath and urine culture was positive for klebsiellla and she got ceftriaxone- She was recently treated as OP for an abnormal urine test She has no diarrhea, no fever Past Medical History:  Diagnosis Date  . Amputation of left  lower extremity below knee ([place])   . Amputation of right lower extremity below knee ([place])   . CAD (coronary artery disease)    a. 2004 Cardiac Arrest/CABG x 3 (LIMA->LAD, VG->OM, VG->RCA);  b. 06/2015 lexiscan MV: no significant ischemia, EF 48%, low risk->Med Rx.  . Chronic combined systolic and diastolic CHF (congestive heart failure) ([place])    a. 12/2014 Echo: EF 45-50%;  b. 08/2015 Echo: EF 45-50%, ant, antsept HK, mildly dil LA, nl RV, mild-mod TR, sev PAH (93mmHg); b. 08/2017 TEE: EF 40-45%, large PFO w/ L->R shunting.  . CKD (chronic kidney disease), stage IV ([place])   . CLL (chronic lymphocytic leukemia) ([place])   . Diabetes mellitus without complication ([place])   . Essential hypertension   . GERD (gastroesophageal reflux disease)   . Hiatal hernia   . History of cardiac arrest    a. 2004.  Marland Kitchen Hyperlipidemia   . Ischemic cardiomyopathy    a. s/p MDT ICD (originally had 2993 lead-->gen change and lead revision ~ 2012 @ [place]); b. 12/2014 Echo: EF 45-50%;  c.08/2015 Echo: EF 45-50%; d. 08/2017 Echo: EF 40-45%.  Marland Kitchen PAD (peripheral artery disease) ([facility])    a. s/p bilat BKA  . Persistent atrial fibrillation    a. Dx 12/2014.  CHA2DS2VASc = 6--> warfarin;  b. 09/2015 s/p DCCV-->on amio; c. s/p DCCV 04/25/16; d. 07/2016 s/p RFCA/PVI in setting of recurrent afib despite amio; e. 05/2018 Recurrent afib.    Past Surgical History:  Procedure Laterality Date  . ABDOMINAL HYSTERECTOMY    .  Amputation lower extremity bilaterally Bilateral   . CARDIAC CATHETERIZATION    . CARDIOVERSION N/A 10/09/2016   Procedure: CARDIOVERSION;  Surgeon: Lelon Perla, MD;  Location: [facility] ENDOSCOPY;  Service: Cardiovascular;  Laterality: N/A;  . CORONARY ARTERY BYPASS GRAFT    . DIALYSIS/PERMA CATHETER INSERTION N/A 10/22/2018   Procedure: DIALYSIS/PERMA CATHETER INSERTION;  Surgeon: Katha Cabal, MD;  Location: [facility] CV LAB;  Service: Cardiovascular;  Laterality: N/A;  . ELECTROPHYSIOLOGIC STUDY N/A 10/07/2015     Procedure: CARDIOVERSION;  Surgeon: Minna Merritts, MD;  Location: [facility] ORS;  Service: Cardiovascular;  Laterality: N/A;  . ELECTROPHYSIOLOGIC STUDY N/A 04/25/2016   Procedure: Cardioversion;  Surgeon: Minna Merritts, MD;  Location: [facility] ORS;  Service: Cardiovascular;  Laterality: N/A;  . ELECTROPHYSIOLOGIC STUDY N/A 07/18/2016   Procedure: Atrial Fibrillation Ablation;  Surgeon: Thompson Grayer, MD;  Location: [facility] CV LAB;  Service: Cardiovascular;  Laterality: N/A;  . IMPLANTABLE CARDIOVERTER DEFIBRILLATOR IMPLANT  2005   [employer]   . TEE WITHOUT CARDIOVERSION N/A 07/18/2016   Procedure: TRANSESOPHAGEAL ECHOCARDIOGRAM (TEE);  Surgeon: Sueanne Margarita, MD;  Location: [facility] ENDOSCOPY;  Service: Cardiovascular;  Laterality: N/A;  . TEE WITHOUT CARDIOVERSION N/A 10/09/2016   Procedure: TRANSESOPHAGEAL ECHOCARDIOGRAM (TEE);  Surgeon: Lelon Perla, MD;  Location: [facility] ENDOSCOPY;  Service: Cardiovascular;  Laterality: N/A;    Social history Non-smoker No alcohol Lives with her daughter She used to be an Therapist, sports   Family History  Problem Relation Age of Onset  . CAD Mother   . Diabetes Mother   . Atrial fibrillation Mother   . Lung cancer Father   . Diabetes Father    Allergies  Allergen Reactions  . Piperacillin Other (See Comments)    Renal failure  . 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 See ID note**PER PT-CAUSED RENAL FAILURE**  . Zosyn [Piperacillin Sod-Tazobactam So] Other (See Comments)    Renal failure    ? Current Facility-Administered Medications  Medication Dose Route Frequency Provider Last Rate Last Dose  . 0.9 %  sodium chloride infusion   Intravenous PRN Schnier, Dolores Lory, MD   Stopped at 10/22/18 0104  . acetaminophen (TYLENOL) tablet 650 mg  650 mg Oral Q6H PRN Schnier, Dolores Lory, MD       Or  . acetaminophen (TYLENOL) suppository 650 mg  650 mg Rectal Q6H PRN Schnier, Dolores Lory, MD      . albuterol (PROVENTIL) (2.5  MG/3ML) 0.083% nebulizer solution 2.5 mg  2.5 mg Nebulization TID Delana Meyer Dolores Lory, MD   2.5 mg at 10/23/18 1310  . amiodarone (PACERONE) tablet 400 mg  400 mg Oral BID Katha Cabal, MD   400 mg at 10/23/18 0946  . amitriptyline (ELAVIL) tablet 25 mg  25 mg Oral QHS Schnier, Dolores Lory, MD   25 mg at 10/22/18 2250  . amLODipine (NORVASC) tablet 10 mg  10 mg Oral Daily Schnier, Dolores Lory, MD   10 mg at 10/22/18 0905  . atorvastatin (LIPITOR) tablet 40 mg  40 mg Oral QHS Schnier, Dolores Lory, MD   40 mg at 10/22/18 2250  . budesonide (PULMICORT) nebulizer solution 0.5 mg  0.5 mg Nebulization BID Schnier, Dolores Lory, MD   0.5 mg at 10/23/18 0755  . carvedilol (COREG) tablet 3.125 mg  3.125 mg Oral Daily Schnier, Dolores Lory, MD   3.125 mg at 10/22/18 0905  . Chlorhexidine Gluconate Cloth 2 % PADS 6 each  6 each  Topical Q0600 Schnier, Dolores Lory, MD   6 each at 10/23/18 [phone]  . feeding supplement (NEPRO CARB STEADY) liquid 237 mL  237 mL Oral BID BM Schnier, Dolores Lory, MD   237 mL at 10/23/18 0948  . gabapentin (NEURONTIN) capsule 100 mg  100 mg Oral BID Delana Meyer Dolores Lory, MD   100 mg at 10/23/18 0947  . Gerhardt's butt cream   Topical BID Schnier, Dolores Lory, MD      . guaiFENesin-dextromethorphan [facility] DM) 100-10 MG/5ML syrup 5 mL  5 mL Oral Q4H PRN Schnier, Dolores Lory, MD   5 mL at 10/22/18 2253  . hydrALAZINE (APRESOLINE) injection 5 mg  5 mg Intravenous Q6H PRN Schnier, Dolores Lory, MD      . HYDROmorphone (DILAUDID) injection 1 mg  1 mg Intravenous Once PRN Schnier, Dolores Lory, MD      . hydrOXYzine (ATARAX/VISTARIL) tablet 25 mg  25 mg Oral TID PRN Delana Meyer Dolores Lory, MD   25 mg at 10/21/18 2156  . insulin aspart (novoLOG) injection 0-15 Units  0-15 Units Subcutaneous TID WC Schnier, Dolores Lory, MD   2 Units at 10/23/18 1756  . insulin aspart (novoLOG) injection 0-5 Units  0-5 Units Subcutaneous QHS Schnier, Dolores Lory, MD   2 Units at 10/22/18 2254  . insulin aspart (novoLOG) injection 5 Units   5 Units Subcutaneous TID WC Schnier, Dolores Lory, MD   5 Units at 10/23/18 1757  . insulin glargine (LANTUS) injection 20 Units  20 Units Subcutaneous BID Schnier, Dolores Lory, MD   20 Units at 10/23/18 [phone]  . ipratropium (ATROVENT) nebulizer solution 0.5 mg  0.5 mg Nebulization Q6H PRN Schnier, Dolores Lory, MD   0.5 mg at 10/19/18 2338  . isosorbide mononitrate (IMDUR) 24 hr tablet 30 mg  30 mg Oral Daily Schnier, Dolores Lory, MD   30 mg at 10/23/18 0947  . lactulose (CHRONULAC) 10 GM/15ML solution 20 g  20 g Oral BID Schnier, Dolores Lory, MD   20 g at 10/23/18 0948  . multivitamin (RENA-VIT) tablet 1 tablet  1 tablet Oral QHS Schnier, Dolores Lory, MD      . nitroGLYCERIN (NITROSTAT) SL tablet 0.4 mg  0.4 mg Sublingual Q5 min PRN Schnier, Dolores Lory, MD      . ondansetron [facility]) tablet 4 mg  4 mg Oral Q6H PRN Schnier, Dolores Lory, MD       Or  . ondansetron [facility]) injection 4 mg  4 mg Intravenous Q6H PRN Schnier, Dolores Lory, MD   4 mg at 10/16/18 1334  . pantoprazole (PROTONIX) EC tablet 40 mg  40 mg Oral QHS Schnier, Dolores Lory, MD   40 mg at 10/22/18 2250  . senna (SENOKOT) tablet 8.6 mg  1 tablet Oral Daily Schnier, Dolores Lory, MD   8.6 mg at 10/22/18 0857  . sertraline (ZOLOFT) tablet 100 mg  100 mg Oral QHS Schnier, Dolores Lory, MD   100 mg at 10/22/18 2250  . sodium chloride flush (NS) 0.9 % injection 3 mL  3 mL Intravenous Q12H Schnier, Dolores Lory, MD   3 mL at 10/23/18 0930  . traMADol (ULTRAM) tablet 50 mg  50 mg Oral Q12H PRN Schnier, Dolores Lory, MD   50 mg at 10/17/18 1312  . Warfarin - Pharmacist Dosing Inpatient   Does not apply [address], Pierce Crane, RPH         Abtx:  Anti-infectives (From admission, onward)   Start     Dose/Rate Route  Frequency Ordered Stop   10/22/18 1548  ceFAZolin (ANCEF) 1-4 GM/50ML-% IVPB  Status:  Discontinued    Note to Pharmacy:  Despina Arias  : cabinet override      10/22/18 1548 10/22/18 1627   10/21/18 2230  clindamycin (CLEOCIN) IVPB 300 mg     300  mg 100 mL/hr over 30 Minutes Intravenous  Once 10/21/18 2219 10/22/18 2015   10/21/18 2200  cephALEXin (KEFLEX) capsule 250 mg     250 mg Oral Every 12 hours 10/21/18 1429 10/23/18 0920   10/21/18 1230  ciprofloxacin (CIPRO) tablet 250 mg  Status:  Discontinued     250 mg Oral Daily 10/21/18 1144 10/21/18 1429   10/16/18 0700  cefTRIAXone (ROCEPHIN) 1 g in sodium chloride 0.9 % 100 mL IVPB  Status:  Discontinued     1 g 200 mL/hr over 30 Minutes Intravenous Every 24 hours 10/16/18 0649 10/21/18 1142   10/16/18 0630  cefTRIAXone (ROCEPHIN) 1 g in sodium chloride 0.9 % 100 mL IVPB     1 g 200 mL/hr over 30 Minutes Intravenous  Once 10/16/18 0617 10/16/18 0810   10/16/18 0615  sulfamethoxazole-trimethoprim (BACTRIM DS,SEPTRA DS) 800-160 MG per tablet 1 tablet  Status:  Discontinued     1 tablet Oral  Once 10/16/18 9833 10/16/18 0616      REVIEW OF SYSTEMS:  Const: negative fever, negative chills, positive weight gain Eyes: negative diplopia or visual changes, negative eye pain ENT: negative coryza, negative sore throat Resp: Positive cough, productive of green sputum, dyspnea Cards: negative for chest pain, palpitations,  has stump edema GU: negative for frequency, dysuria and hematuria GI: Negative for abdominal pain, diarrhea, bleeding, constipation Skin: negative for rash and pruritus Heme: negative for easy bruising and gum/nose bleeding MS: Generalized weakness Neurolo:negative for headaches, dizziness, vertigo, memory problems  Psych: negative for feelings of anxiety, depression  Endocrine: No polyuria or polydipsia Allergy/Immunology-Zosyn caused AIN  Objective:  VITALS:  BP (!) 151/69   Pulse (!) 53   Temp (!) 97.5 F (36.4 C) (Oral)   Resp 17   Ht 5\' 8"  (1.727 m)   Wt 111.8 kg   SpO2 99%   BMI 37.48 kg/m  PHYSICAL EXAM:  General: Alert, cooperative, no distress, appears stated age.  Pale Head: Normocephalic, without obvious abnormality, atraumatic. Eyes:  Conjunctivae clear, anicteric sclerae. Pupils are equal ENT Nares normal. No drainage or sinus tenderness. Lips, mucosa, and tongue normal. No Thrush, edentulous Neck: Supple, symmetrical, no adenopathy, thyroid: non tender no carotid bruit and no JVD. Back: No CVA tenderness. Lungs: Bilateral air entry crypts and rhonchi present  heart: S1-S2 irregular Sternal scar Abdomen: Soft, non-tender,not distended. Bowel sounds normal. No masses Extremities: Bilateral BKA stump healthy Skin: No rashes or lesions. Or bruising Lymph: Cervical, supraclavicular normal. Neurologic: Grossly non-focal Pertinent Labs Lab Results CBC     Component Value Date/Time   WBC 41.7 (H) 10/23/2018 0726   RBC 4.35 10/23/2018 0726   HGB 11.1 (L) 10/23/2018 0726   HGB 12.7 10/02/2016 1442   HCT 37.0 10/23/2018 0726   HCT 40.5 10/02/2016 1442   PLT 133 (L) 10/23/2018 0726   PLT 193 10/02/2016 1442   MCV 85.1 10/23/2018 0726   MCV 87 10/02/2016 1442   MCH 25.5 (L) 10/23/2018 0726   MCHC 30.0 10/23/2018 0726   RDW 18.4 (H) 10/23/2018 0726   RDW 16.1 (H) 10/02/2016 1442   LYMPHSABS 26.3 (H) 04/14/2018 1343   LYMPHSABS 24.6 (H) 10/02/2016 1442  MONOABS 0.8 04/14/2018 1343   EOSABS 0.0 04/14/2018 1343   EOSABS 0.3 10/02/2016 1442   BASOSABS 0.0 04/14/2018 1343   BASOSABS 0.1 10/02/2016 1442    CMP Latest Ref Rng & Units 10/23/2018 10/22/2018 10/21/2018  Glucose 70 - 99 mg/dL 185(H) 209(H) 310(H)  BUN 8 - 23 mg/dL 117(H) 114(H) 111(H)  Creatinine 0.44 - 1.00 mg/dL 3.87(H) 4.19(H) 4.27(H)  Sodium 135 - 145 mmol/L 134(L) 132(L) 131(L)  Potassium 3.5 - 5.1 mmol/L 4.4 4.0 4.0  Chloride 98 - 111 mmol/L 102 98 97(L)  CO2 22 - 32 mmol/L 20(L) 23 23  Calcium 8.9 - 10.3 mg/dL 8.3(L) 8.2(L) 8.4(L)  Total Protein 6.5 - 8.1 g/dL - - -  Total Bilirubin 0.3 - 1.2 mg/dL - - -  Alkaline Phos 38 - 126 U/L - - -  AST 15 - 41 U/L - - -  ALT 0 - 44 U/L - - -      Microbiology: Recent Results (from the past 240  hour(s))  Respiratory Panel by PCR     Status: Abnormal   Collection Time: 10/16/18  4:05 AM  Result Value Ref Range Status   Adenovirus NOT DETECTED NOT DETECTED Final   Coronavirus 229E NOT DETECTED NOT DETECTED Final    Comment: (NOTE) The Coronavirus on the Respiratory Panel, DOES NOT test for the novel  Coronavirus (2019 nCoV)    Coronavirus HKU1 NOT DETECTED NOT DETECTED Final   Coronavirus NL63 NOT DETECTED NOT DETECTED Final   Coronavirus OC43 NOT DETECTED NOT DETECTED Final   Metapneumovirus DETECTED (A) NOT DETECTED Final   Rhinovirus / Enterovirus NOT DETECTED NOT DETECTED Final   Influenza A NOT DETECTED NOT DETECTED Final   Influenza B NOT DETECTED NOT DETECTED Final   Parainfluenza Virus 1 NOT DETECTED NOT DETECTED Final   Parainfluenza Virus 2 NOT DETECTED NOT DETECTED Final   Parainfluenza Virus 3 NOT DETECTED NOT DETECTED Final   Parainfluenza Virus 4 NOT DETECTED NOT DETECTED Final   Respiratory Syncytial Virus NOT DETECTED NOT DETECTED Final   Bordetella pertussis NOT DETECTED NOT DETECTED Final   Chlamydophila pneumoniae NOT DETECTED NOT DETECTED Final   Mycoplasma pneumoniae NOT DETECTED NOT DETECTED Final    Comment: Performed at [facility], [address] [place], [place] [address]  Urine culture     Status: Abnormal   Collection Time: 10/16/18  4:23 AM  Result Value Ref Range Status   Specimen Description URINE, CLEAN CATCH  Final   Special Requests   Final    NONE Performed at [facility], [address] [place], [place] [address]    Culture >=100,000 COLONIES/mL KLEBSIELLA PNEUMONIAE (A)  Final   Report Status 10/19/2018 FINAL  Final   Organism ID, Bacteria KLEBSIELLA PNEUMONIAE (A)  Final      Susceptibility   Klebsiella pneumoniae - MIC*    AMPICILLIN RESISTANT Resistant     CEFAZOLIN <=4 SENSITIVE Sensitive     CEFTRIAXONE <=1 SENSITIVE Sensitive     CIPROFLOXACIN <=0.25 SENSITIVE Sensitive     GENTAMICIN <=1 SENSITIVE  Sensitive     IMIPENEM <=0.25 SENSITIVE Sensitive     NITROFURANTOIN <=16 SENSITIVE Sensitive     TRIMETH/SULFA <=20 SENSITIVE Sensitive     AMPICILLIN/SULBACTAM 4 SENSITIVE Sensitive     PIP/TAZO <=4 SENSITIVE Sensitive     Extended ESBL NEGATIVE Sensitive     * >=100,000 COLONIES/mL KLEBSIELLA PNEUMONIAE  MRSA PCR Screening     Status: Abnormal   Collection  Time: 10/16/18  5:13 PM  Result Value Ref Range Status   MRSA by PCR POSITIVE (A) NEGATIVE Final    Comment:        The GeneXpert MRSA Assay (FDA approved for NASAL specimens only), is one component of a comprehensive MRSA colonization surveillance program. It is not intended to diagnose MRSA infection nor to guide or monitor treatment for MRSA infections. RESULT CALLED TO, READ BACK BY AND VERIFIED WITH: MAI QUIARORO 10/16/18 @ 1838  [facility] Performed at [facility], [address] [place], [place] [address]     IMAGING RESULTS:  I have personally reviewed the films ? Impression/Recommendation ? ?73 year old female admitted with cough, shortness of breath and weakness.  Asymptomatic bacteriuria was treated with 1 week of ceftriaxone. urine smelly urine which is not sign of urinary tract infection.  No antibiotic needed now. ? _Cough and shortness of breath: Could be due to the metapneumovirus diagnosed by  respiratory viral PCR and could also be from congestive heart failure.  Recommend sending a sputum for culture as she has cream sputum and repeating a chest x-ray to look for infiltrate. She has MRSA in the in her nares.  And sputum is even more important to rule out MRSA.  Leukocytosis chronic but is worsening now.  She has intermittently had [place] count of up to 50 K.  She has CLL.  The leukocytosis could be a manifestation of CLL .  But would recommend a differential to look at the [facility] of lymphocytes and also a peripheral smear.  She also received steroids which could have made it worse. There is no  evidence of C. difficile.  The chest x-ray from 2 /5 did not show any pneumonia.  But because of ongoing cough and sputum production recommend repeating chest x-ray and  sending sputum for culture.  Will send blood for culture. Currently not concerned about ICD vegetation.   Worsening renal function.  Getting dialysis now.   Chronic A. fib: On Coumadin, amiodarone   Diabetes mellitus on sliding scale insulin  Acute on chronic congestive heart failure: She had a high BNP on admission.  The chronic cough of 3 weeks could be secondary to CHF.  Currently on carvedilol, isosorbide mononitrate   __________________________________________________ Discussed the management with the patient   note:  This document was prepared using Dragon voice recognition software and may include unintentional dictation errors.

## 2018-10-23 NOTE — Assessment & Plan Note (Signed)
#  73 year old female patient with multiple medical problems including CLL CHF COPD CKD-acute renal failure currently on dialysis A. 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  No concerns for any overt progression of CLL at this time.  [place] count slightly elevated from baseline-likely reactive.  Mild thrombocytopenia likely secondary to CLL.  Recommend checking quantitative immunoglobulin.  #UTI-status post antibiotics; awaiting ID evaluation.  #CKD/AKI-on dialysis.  As per nephrology.  #A. fib on Coumadin/CAD CHF/PVD.  Thank you Dr.Pyreddy for allowing me to participate in the care of your pleasant patient. Please do not hesitate to contact me with questions or concerns in the interim.

## 2018-10-23 NOTE — Progress Notes (Addendum)
[facility] [place] NAME: Caroline Hernandez    MR#:  [id number]  DATE OF BIRTH:  03/08/1946  SUBJECTIVE:  Patient seen and evaluated today Vas-Cath has been placed for dialysis access No fever No chest pain On oxygen via nasal cannula at 2 L  REVIEW OF SYSTEMS:    Review of Systems  Constitutional: Negative.   HENT: Negative.   Eyes: Negative.   Respiratory: Mild cough present Cardiovascular: Negative.   Gastrointestinal: Negative.   Genitourinary: Negative.   Musculoskeletal: Negative.   Skin: Negative.   Neurological: Negative.   Endo/Heme/Allergies: Negative.   Psychiatric/Behavioral: Negative.   All other systems reviewed and are negative.  DRUG ALLERGIES:   Allergies  Allergen Reactions  . Piperacillin Other (See Comments)    Renal failure  . 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 See ID note**PER PT-CAUSED RENAL FAILURE**  . Zosyn [Piperacillin Sod-Tazobactam So] Other (See Comments)    Renal failure    VITALS:  Blood pressure (!) 118/96, pulse (!) 118, temperature (!) 97.5 F (36.4 C), temperature source Oral, resp. rate 20, height 5\' 8"  (1.727 m), weight 111.8 kg, SpO2 99 %.  PHYSICAL EXAMINATION:   Physical Exam  GENERAL:  73 y.o.-year-old patient lying in the bed on oxygen via nasal cannula. Unable to speak in full sentences without shortness of breath EYES: Pupils equal, round, reactive to light and accommodation. No scleral icterus. Extraocular muscles intact.  HEENT: Head atraumatic, normocephalic. Oropharynx and nasopharynx clear.  NECK:  Supple, no jugular venous distention. No thyroid enlargement, no tenderness.  LUNGS: Scattered wheezing, decreased coarse breath sounds Bilateral good airflow CARDIOVASCULAR: S1, S2 normal. No murmurs, rubs, or gallops.  ABDOMEN: Soft, nontender, nondistended. Bowel sounds present. No organomegaly or mass.  EXTREMITIES: No cyanosis,  clubbing or edema b/l.   Bilateral below-knee amputations NEUROLOGIC: Cranial nerves II through XII are intact. No focal Motor or sensory deficits b/l.   PSYCHIATRIC: The patient is alert and oriented x 3.  SKIN: No obvious rash, lesion, or ulcer.   LABORATORY PANEL:   CBC Recent Labs  Lab 10/23/18 0726  WBC 41.7*  HGB 11.1*  HCT 37.0  PLT 133*   ------------------------------------------------------------------------------------------------------------------ Chemistries  Recent Labs  Lab 10/17/18 0530  10/22/18 0433 10/23/18 0726  NA 135   < > 132* 134*  K 4.2   < > 4.0 4.4  CL 100   < > 98 102  CO2 25   < > 23 20*  GLUCOSE 95   < > 209* 185*  BUN 52*   < > 114* 117*  CREATININE 3.60*   < > 4.19* 3.87*  CALCIUM 8.5*   < > 8.2* 8.3*  MG  --   --  2.7*  --   AST 45*  --   --   --   ALT 25  --   --   --   ALKPHOS 67  --   --   --   BILITOT 0.8  --   --   --    < > = values in this interval not displayed.   ------------------------------------------------------------------------------------------------------------------  Cardiac Enzymes Recent Labs  Lab 10/17/18 0530  TROPONINI 0.10*   ------------------------------------------------------------------------------------------------------------------  RADIOLOGY:  No results found.   ASSESSMENT AND PLAN:   *Acute bronchitis improved Status post acute hypoxic respiratory failure - oral steroids to continue - Scheduled Nebulizers - Inhalers -Wean O2 as tolerated -Respiratory panel with  Metapneumovirus. -Wean oxygen as tolerated  *Acute kidney injury over CKD stage III worsened Dialysis by nephrology today Access catheter  placed by vascular surgery yesterday Nephrology f/u Monitor renal function  *Acute on chronic systolic congestive heart failure.  Likely decompensated secondary to bronchitis.   No signs of fluid overload at this time.   *UTI with leukocytosis.  Klebsiella in urine cultures.  Continue  oral Keflex antibiotic  *Chronic atrial fibrillation.  Continue Coumadin, amiodarone and Coreg  *Diabetes mellitus type 2.  On sliding scale insulin along with Lantus insulin Monitor blood sugars closely to avoid hypoglycemia  *Leukocytosis worsening Will get oncology evaluation and ID consult No evidence of any sepsis Could be from leukemia Could be from steroids and hence we will stop the steroids and follow-up WBC count Currently on antibiotic We will follow-up the trend  All the records are reviewed and case discussed with Care Management/Social Worker Management plans discussed with the patient, family and they are in agreement.  CODE STATUS: Full code  DVT Prophylaxis: SCDs  TOTAL TIME TAKING CARE OF THIS PATIENT: 37 minutes.   POSSIBLE D/C IN 1-2 DAYS, DEPENDING ON CLINICAL CONDITION.  Saundra Shelling M.D on 10/23/2018 at 12:49 PM  Between 7am to 6pm - Pager - [phone]  After 6pm go to [email or web address] - password [email or web address] [facility]  Office  [phone]  CC: Primary care physician; System, Provider Not In  Note: This dictation was prepared with Dragon dictation along with smaller phrase technology. Any transcriptional errors that result from this process are unintentional.

## 2018-10-23 NOTE — Progress Notes (Signed)
HD Post tx Assessment   10/23/18 1700  Neurological  Level of Consciousness Alert  Orientation Level Oriented X4  Respiratory  Respiratory Pattern Labored  Bilateral Breath Sounds Rhonchi  Cough Non-productive  Cardiac  Pulse Regular  Heart Sounds S1, S2  ECG Monitor Yes  Cardiac Rhythm Atrial fibrillation  Vascular  R Radial Pulse +2  L Radial Pulse +2  Edema Generalized;Facial  Integumentary  Integumentary (WDL) X  Skin Color Appropriate for ethnicity  Musculoskeletal  Musculoskeletal (WDL) X  Generalized Weakness  (hemodialysis access)  GU Assessment  Genitourinary (WDL) WDL  Psychosocial  Psychosocial (WDL) WDL

## 2018-10-23 NOTE — Progress Notes (Signed)
HD Tx Start   10/23/18 1430  Vital Signs  Pulse Rate (!) 58  Pulse Rate Source Monitor  Resp 16  BP (!) 155/66  BP Location Left Arm  BP Method Automatic  Patient Position (if appropriate) Lying  Oxygen Therapy  SpO2 99 %  O2 Device Nasal Cannula  O2 Flow Rate (L/min) 3 L/min  During Hemodialysis Assessment  Blood Flow Rate (mL/min) 200 mL/min  Arterial Pressure (mmHg) -40 mmHg  Venous Pressure (mmHg) 50 mmHg  Transmembrane Pressure (mmHg) 30 mmHg  Ultrafiltration Rate (mL/min) 250 mL/min  Dialysate Flow Rate (mL/min) 300 ml/min  Conductivity: Machine  14  HD Safety Checks Performed Yes  Dialysis Fluid Bolus Normal Saline  Bolus Amount (mL) 250 mL  Intra-Hemodialysis Comments Tx initiated  Education / Care Plan  Dialysis Education Provided Yes  Documented Education in Care Plan Yes

## 2018-10-23 NOTE — Consult Note (Signed)
ANTICOAGULATION CONSULT NOTE - Initial Consult  Pharmacy Consult for Heparin infusion Indication: atrial fibrillation  Allergies  Allergen Reactions  . Piperacillin Other (See Comments)    Renal failure  . 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 See ID note**PER PT-CAUSED RENAL FAILURE**  . Zosyn [Piperacillin Sod-Tazobactam So] Other (See Comments)    Renal failure    Patient Measurements: Height: 5\' 8"  (172.7 cm) Weight: 246 lb 7.6 oz (111.8 kg) IBW/kg (Calculated) : 63.9 Heparin Dosing Weight: 84.8 kg  Vital Signs: Temp: 97.5 F (36.4 C) (02/12 0809) Temp Source: Oral (02/12 0809) BP: 118/96 (02/12 0809) Pulse Rate: 118 (02/12 0809)  Labs: Recent Labs    10/21/18 0426  10/21/18 1658 10/22/18 0433 10/22/18 1026 10/22/18 2254 10/23/18 0726  HGB  --    < > 11.4* 11.1*  --   --  11.1*  HCT  --   --  36.7 35.3*  --   --  37.0  PLT  --   --  123* 130*  --   --  133*  LABPROT 23.6*  --   --  19.3*  --   --  15.7*  INR 2.14  --   --  1.65  --   --  1.26  HEPARINUNFRC  --   --   --   --  0.55 0.27* 1.06*  CREATININE 4.27*  --   --  4.19*  --   --  3.87*   < > = values in this interval not displayed.    Estimated Creatinine Clearance: 17.7 mL/min (A) (by C-G formula based on SCr of 3.87 mg/dL (H)).   Medical History: Past Medical History:  Diagnosis Date  . Amputation of left lower extremity below knee ([facility])   . Amputation of right lower extremity below knee ([facility])   . CAD (coronary artery disease)    a. 2004 Cardiac Arrest/CABG x 3 (LIMA->LAD, VG->OM, VG->RCA);  b. 06/2015 lexiscan MV: no significant ischemia, EF 48%, low risk->Med Rx.  . Chronic combined systolic and diastolic CHF (congestive heart failure) ([facility])    a. 12/2014 Echo: EF 45-50%;  b. 08/2015 Echo: EF 45-50%, ant, antsept HK, mildly dil LA, nl RV, mild-mod TR, sev PAH (38mmHg); b. 08/2017 TEE: EF 40-45%, large PFO w/ L->R shunting.  . CKD (chronic kidney disease),  stage IV ([facility])   . CLL (chronic lymphocytic leukemia) ([facility])   . Diabetes mellitus without complication ([facility])   . Essential hypertension   . GERD (gastroesophageal reflux disease)   . Hiatal hernia   . History of cardiac arrest    a. 2004.  Marland Kitchen Hyperlipidemia   . Ischemic cardiomyopathy    a. s/p MDT ICD (originally had 1884 lead-->gen change and lead revision ~ 2012 @ [facility]); b. 12/2014 Echo: EF 45-50%;  c.08/2015 Echo: EF 45-50%; d. 08/2017 Echo: EF 40-45%.  Marland Kitchen PAD (peripheral artery disease) ([facility])    a. s/p bilat BKA  . Persistent atrial fibrillation    a. Dx 12/2014.  CHA2DS2VASc = 6--> warfarin;  b. 09/2015 s/p DCCV-->on amio; c. s/p DCCV 04/25/16; d. 07/2016 s/p RFCA/PVI in setting of recurrent afib despite amio; e. 05/2018 Recurrent afib.    Medications:  Scheduled:  . albuterol  2.5 mg Nebulization TID  . amiodarone  400 mg Oral BID  . amitriptyline  25 mg Oral QHS  . amLODipine  10 mg Oral Daily  . atorvastatin  40 mg Oral QHS  . budesonide (PULMICORT)  nebulizer solution  0.5 mg Nebulization BID  . carvedilol  3.125 mg Oral Daily  . cephALEXin  250 mg Oral Q12H  . Chlorhexidine Gluconate Cloth  6 each Topical Q0600  . feeding supplement (NEPRO CARB STEADY)  237 mL Oral BID BM  . gabapentin  100 mg Oral BID  . Gerhardt's butt cream   Topical BID  . insulin aspart  0-15 Units Subcutaneous TID WC  . insulin aspart  0-5 Units Subcutaneous QHS  . insulin aspart  5 Units Subcutaneous TID WC  . insulin glargine  20 Units Subcutaneous BID  . isosorbide mononitrate  30 mg Oral Daily  . lactulose  20 g Oral BID  . multivitamin  1 tablet Oral QHS  . pantoprazole  40 mg Oral QHS  . predniSONE  40 mg Oral Q breakfast  . senna  1 tablet Oral Daily  . sertraline  100 mg Oral QHS  . sodium chloride flush  3 mL Intravenous Q12H   Infusions:  . sodium chloride Stopped (10/22/18 0104)  . heparin 1,200 Units/hr (10/23/18 0981)    Assessment: Patient previously being anticoagulated with  warfarin.  Patient now being transitioned to heparin infusion due to planned procedures of Permcath placement and peritoneal dialysis access.  Due to this vascular surgery is planning to give patient Vitamin K 5 mg PO once for warfarin reversal with INR 2.14.    Per discussion with Marcelle Overlie, PA-C will start heparin drip without bolus.  Typical transition would be initiated when INR is a lower limit of goal range without bolus and dosed per indication.  Heparin is currently running at 1200 units/hr and level has not been checked prior to now (first level check was delayed)  2/11 1026 HL = 0.55 Therapeutic x 1 2/11 2254 HL = 0.27 (but drip had been paused) - restarted at same rate 2/12 0726 HL = 1.06  Goal of Therapy:  Heparin level 0.3-0.7 units/ml Monitor platelets by anticoagulation protocol: Yes   Plan:  Verified with nursing that HL had been drawn from opposite arm of drip.  Nursing to hold drip from 0930 to 1030 and restart at lower rate of 1000 units/hr.  Will recheck HL @ [place], PharmD, BCPS Clinical Pharmacist 10/23/2018 9:20 AM

## 2018-10-23 NOTE — Progress Notes (Signed)
HD Tx End    10/23/18 1630  Vital Signs  Pulse Rate (!) 52  Resp 14  BP (!) 151/64  Oxygen Therapy  SpO2 99 %  During Hemodialysis Assessment  Blood Flow Rate (mL/min) 200 mL/min  Arterial Pressure (mmHg) -60 mmHg  Venous Pressure (mmHg) 80 mmHg  Transmembrane Pressure (mmHg) 30 mmHg  Ultrafiltration Rate (mL/min) 250 mL/min  Dialysate Flow Rate (mL/min) 300 ml/min  Conductivity: Machine  14  HD Safety Checks Performed Yes  Intra-Hemodialysis Comments  (Tx ends, tolerated well)  Post-Hemodialysis Assessment  Rinseback Volume (mL) 250 mL  KECN 25 V  Dialyzer Clearance Clear  Duration of HD Treatment -hour(s) 2 hour(s)  Hemodialysis Intake (mL) 500 mL  UF Total -Machine (mL) 500 mL  Net UF (mL) 0 mL  Tolerated HD Treatment Yes

## 2018-10-23 NOTE — Clinical Social Work Note (Signed)
Clinical Social Work Assessment  Patient Details  Name: Caroline Hernandez MRN: [id number] Date of Birth: 1946-02-21  Date of referral:  10/23/18               Reason for consult:  Facility Placement                Permission sought to share information with:  Facility Sport and exercise psychologist Permission granted to share information::  Yes, Verbal Permission Granted  Name::     Wall,Jennifer Daughter [phone]  [phone]   Agency::  [facility]  Relationship::     Contact Information:     Housing/Transportation Living arrangements for the past 2 months:  Single Family Home Source of Information:  Patient, Adult Children Patient Interpreter Needed:  None Criminal Activity/Legal Involvement Pertinent to Current Situation/Hospitalization:  No - Comment as needed Significant Relationships:  Adult Children Lives with:  Adult Children Do you feel safe going back to the place where you live?  No Need for family participation in patient care:  Yes (Comment)  Care giving concerns:  Patient feels that she needs short term rehab before returning back home.   Social Worker assessment / plan:  Patient is a 73 year old female who is alert and oriented x4. Patient lives with her daughter, CSW explained role and process for finding a short term rehab bed and how insurance will pay for stay.  Patient states she has been to a SNF in the past in [facility], she was not very happy with the experience.  Patient was explained that there are other facilities locally, and CSW can look for a SNF bed in [facility].  Patient was explained if insurance denies she may have to consider going home with home health, patient and her daughter expressed understanding.  Patient and daughter did not have any other questions.  Employment status:  Retired Nurse, adult PT Recommendations:  [place] / Referral to community resources:  [place]  Patient/Family's Response to care:  Patient and daughter are in agreement to going to SNF for short term rehab.  Patient/Family's Understanding of and Emotional Response to Diagnosis, Current Treatment, and Prognosis:  Patient is hopeful that she will not have to be in SNF for very long.  Emotional Assessment Appearance:  Appears stated age Attitude/Demeanor/Rapport:    Affect (typically observed):  Appropriate, Stable, Calm Orientation:  Oriented to Self, Oriented to Place, Oriented to  Time, Oriented to Situation Alcohol / Substance use:  Not Applicable Psych involvement (Current and /or in the community):  No (Comment)  Discharge Needs  Concerns to be addressed:  Lack of Support, Care Coordination Readmission within the last 30 days:  No Current discharge risk:  Lack of support system Barriers to Discharge:  Continued Medical Work up, [employer]   Anell Barr 10/23/2018, 5:36 PM

## 2018-10-23 NOTE — Progress Notes (Signed)
Pre HD Tx   10/23/18 1400  Hand-Off documentation  Report given to (Full Name) Trellis Paganini RN  Report received from (Full Name) Georgina Peer RN  Vital Signs  Temp (!) 97.5 F (36.4 C)  Temp Source Oral  Pulse Rate (!) 56  Pulse Rate Source Monitor  Resp 16  BP (!) 148/76  BP Location Left Arm  BP Method Automatic  Patient Position (if appropriate) Lying  Oxygen Therapy  SpO2 99 %  O2 Device Nasal Cannula  O2 Flow Rate (L/min) 3 L/min  Pain Assessment  Pain Scale 0-10  Pain Score 0  Dialysis Weight  Weight 111.8 kg  Type of Weight Pre-Dialysis  Machine Checks  Machine Number 5  Station Number 1  UF/Alarm Test Passed  Conductivity: Meter 14  Conductivity: Machine  14  pH 7.4  Reverse Osmosis main  Normal Saline Lot Number [id number]  Dialyzer Lot Number [id number]  Disposable Set Lot Number [id number]  Machine Temperature 98.6 F (37 C)  Musician and Audible Yes  Blood Lines Intact and Secured Yes  Pre Treatment Patient Checks  Vascular access used during treatment Catheter  Hepatitis B Surface Antigen Results Negative  Date Hepatitis B Surface Antigen Drawn 10/21/18  Isolation Initiated Yes  Hepatitis B Surface Antibody  (<10)  Date Hepatitis B Surface Antibody Drawn 10/21/18  Hemodialysis Consent Verified Yes  Hemodialysis Standing Orders Initiated Yes  ECG (Telemetry) Monitor On Yes  Prime Ordered Normal Saline  Length of  DialysisTreatment -hour(s) 2 Hour(s)  Dialyzer Elisio 17H NR  Dialysate 3K, 2.5 Ca  Dialysate Flow Ordered 300  Blood Flow Rate Ordered 200 mL/min  Ultrafiltration Goal 0 Liters  Dialysis Blood Pressure Support Ordered Normal Saline

## 2018-10-23 NOTE — Clinical Social Work Note (Signed)
CSW spoke to patient and her family they would like to pursue SNF placement in [facility] [place] was given permission to begin bed search.  CSW explained to patient and her daughter the process, along with how insurance will pay for stay.  Jones Broom. [place], MSW, [place]  10/23/2018 2:05 PM

## 2018-10-23 NOTE — Progress Notes (Signed)
PT Cancellation Note  Patient Details Name: Caroline Hernandez MRN: [id number] DOB: August 12, 1946   Cancelled Treatment:     Pt declined session at this time.  Reported general fatigue and not feeling well.  Awaiting dialysis treatment today.  Will continue as appropriate.   Chesley Noon 10/23/2018, 12:10 PM

## 2018-10-23 NOTE — Consult Note (Signed)
[place] CONSULT NOTE  Patient Care Team: System, Provider Not In as PCP - General Gollan, Kathlene November, MD as PCP - Cardiology (Cardiology) Minna Merritts, MD as Consulting Physician (Cardiology)  CHIEF COMPLAINTS/PURPOSE OF CONSULTATION:  CLL  HISTORY OF PRESENTING ILLNESS: Patient seen in hemodialysis Caroline Hernandez 73 y.o.  female with a history of CLL currently on surveillance; history of multiple medical problems including CHF/COPD/CKD PVD/A. fib on Coumadin is currently admitted to hospital for UTI/infection.  Patient was treated with ceftriaxone followed by Keflex.  Unfortunately patient noted to have progressive renal failure; and patient is currently started on hemodialysis this afternoon.  Patient is Piasecki count has steadily been going up.  Oncology has been consulted for further evaluation recommendations.  ID evaluation is pending.  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  However, as patient is clinically asymptomatic she is currently under surveillance with hematology oncology at [facility].  Review of Systems  Constitutional: Positive for malaise/fatigue and weight loss. Negative for chills, diaphoresis and fever.  HENT: Negative for nosebleeds and sore throat.   Eyes: Negative for double vision.  Respiratory: Negative for cough, hemoptysis, sputum production, shortness of breath and wheezing.   Cardiovascular: Negative for chest pain, palpitations, orthopnea and leg swelling.  Gastrointestinal: Negative for abdominal pain, blood in stool, constipation, diarrhea, heartburn, melena, nausea and vomiting.  Genitourinary: Negative for dysuria, frequency and urgency.  Musculoskeletal: Negative for back pain and joint pain.  Skin: Negative.  Negative for itching and rash.  Neurological: Negative for dizziness, tingling, focal weakness,  weakness and headaches.  Endo/Heme/Allergies: Does not bruise/bleed easily.  Psychiatric/Behavioral: Negative for depression. The patient is not nervous/anxious and does not have insomnia.      MEDICAL HISTORY:  Past Medical History:  Diagnosis Date  . Amputation of left lower extremity below knee ([place])   . Amputation of right lower extremity below knee ([place])   . CAD (coronary artery disease)    a. 2004 Cardiac Arrest/CABG x 3 (LIMA->LAD, VG->OM, VG->RCA);  b. 06/2015 lexiscan MV: no significant ischemia, EF 48%, low risk->Med Rx.  . Chronic combined systolic and diastolic CHF (congestive heart failure) ([place])    a. 12/2014 Echo: EF 45-50%;  b. 08/2015 Echo: EF 45-50%, ant, antsept HK, mildly dil LA, nl RV, mild-mod TR, sev PAH (30mmHg); b. 08/2017 TEE: EF 40-45%, large PFO w/ L->R shunting.  . CKD (chronic kidney disease), stage IV ([place])   . CLL (chronic lymphocytic leukemia) ([place])   . Diabetes mellitus without complication ([place])   . Essential hypertension   . GERD (gastroesophageal reflux disease)   . Hiatal hernia   . History of cardiac arrest    a. 2004.  Marland Kitchen Hyperlipidemia   . Ischemic cardiomyopathy    a. s/p MDT ICD (originally had 1324 lead-->gen change and lead revision ~ 2012 @ [place]); b. 12/2014 Echo: EF 45-50%;  c.08/2015 Echo: EF 45-50%; d. 08/2017 Echo: EF 40-45%.  Marland Kitchen PAD (peripheral artery disease) (HCC)    a. s/p bilat BKA  . Persistent atrial fibrillation    a. Dx 12/2014.  CHA2DS2VASc = 6--> warfarin;  b. 09/2015 s/p DCCV-->on amio; c. s/p DCCV 04/25/16; d. 07/2016 s/p RFCA/PVI in setting of recurrent afib despite amio; e. 05/2018 Recurrent afib.    SURGICAL HISTORY: Past Surgical History:  Procedure Laterality Date  . ABDOMINAL HYSTERECTOMY    .  Amputation lower extremity bilaterally Bilateral   . CARDIAC CATHETERIZATION    . CARDIOVERSION N/A 10/09/2016   Procedure: CARDIOVERSION;  Surgeon: Lelon Perla, MD;  Location: [facility] ENDOSCOPY;  Service: Cardiovascular;   Laterality: N/A;  . CORONARY ARTERY BYPASS GRAFT    . DIALYSIS/PERMA CATHETER INSERTION N/A 10/22/2018   Procedure: DIALYSIS/PERMA CATHETER INSERTION;  Surgeon: Katha Cabal, MD;  Location: [place] CV LAB;  Service: Cardiovascular;  Laterality: N/A;  . ELECTROPHYSIOLOGIC STUDY N/A 10/07/2015   Procedure: CARDIOVERSION;  Surgeon: Minna Merritts, MD;  Location: [facility] ORS;  Service: Cardiovascular;  Laterality: N/A;  . ELECTROPHYSIOLOGIC STUDY N/A 04/25/2016   Procedure: Cardioversion;  Surgeon: Minna Merritts, MD;  Location: [facility] ORS;  Service: Cardiovascular;  Laterality: N/A;  . ELECTROPHYSIOLOGIC STUDY N/A 07/18/2016   Procedure: Atrial Fibrillation Ablation;  Surgeon: Thompson Grayer, MD;  Location: [place] CV LAB;  Service: Cardiovascular;  Laterality: N/A;  . IMPLANTABLE CARDIOVERTER DEFIBRILLATOR IMPLANT  2005   [employer]   . TEE WITHOUT CARDIOVERSION N/A 07/18/2016   Procedure: TRANSESOPHAGEAL ECHOCARDIOGRAM (TEE);  Surgeon: Sueanne Margarita, MD;  Location: [facility] ENDOSCOPY;  Service: Cardiovascular;  Laterality: N/A;  . TEE WITHOUT CARDIOVERSION N/A 10/09/2016   Procedure: TRANSESOPHAGEAL ECHOCARDIOGRAM (TEE);  Surgeon: Lelon Perla, MD;  Location: [facility] ENDOSCOPY;  Service: Cardiovascular;  Laterality: N/A;    SOCIAL HISTORY: Social History   Socioeconomic History  . Marital status: Divorced    Spouse name: Not on file  . Number of children: Not on file  . Years of education: Not on file  . Highest education level: Not on file  Occupational History  . Not on file  Social Needs  . Financial resource strain: Not on file  . Food insecurity:    Worry: Not on file    Inability: Not on file  . Transportation needs:    Medical: Not on file    Non-medical: Not on file  Tobacco Use  . Smoking status: Never Smoker  . Smokeless tobacco: Never Used  Substance and Sexual Activity  . Alcohol use: No  . Drug use: No  . Sexual activity: Not on file  Lifestyle  . Physical  activity:    Days per week: Not on file    Minutes per session: Not on file  . Stress: Not on file  Relationships  . Social connections:    Talks on phone: Not on file    Gets together: Not on file    Attends religious service: Not on file    Active member of club or organization: Not on file    Attends meetings of clubs or organizations: Not on file    Relationship status: Not on file  . Intimate partner violence:    Fear of current or ex partner: Not on file    Emotionally abused: Not on file    Physically abused: Not on file    Forced sexual activity: Not on file  Other Topics Concern  . Not on file  Social History Narrative  . Not on file    FAMILY HISTORY: Family History  Problem Relation Age of Onset  . CAD Mother   . Diabetes Mother   . Atrial fibrillation Mother   . Lung cancer Father   . Diabetes Father     ALLERGIES:  is allergic to piperacillin; piperacillin-tazobactam in dex; and zosyn [piperacillin sod-tazobactam so].  MEDICATIONS:  Current Facility-Administered Medications  Medication Dose Route Frequency Provider Last Rate Last Dose  . 0.9 %  sodium chloride infusion   Intravenous PRN Schnier, Dolores Lory, MD   Stopped at 10/22/18 0104  . acetaminophen (TYLENOL) tablet 650 mg  650 mg Oral Q6H PRN Schnier, Dolores Lory, MD       Or  . acetaminophen (TYLENOL) suppository 650 mg  650 mg Rectal Q6H PRN Schnier, Dolores Lory, MD      . albuterol (PROVENTIL) (2.5 MG/3ML) 0.083% nebulizer solution 2.5 mg  2.5 mg Nebulization TID Delana Meyer Dolores Lory, MD   2.5 mg at 10/23/18 1310  . amiodarone (PACERONE) tablet 400 mg  400 mg Oral BID Katha Cabal, MD   400 mg at 10/23/18 0946  . amitriptyline (ELAVIL) tablet 25 mg  25 mg Oral QHS Schnier, Dolores Lory, MD   25 mg at 10/22/18 2250  . amLODipine (NORVASC) tablet 10 mg  10 mg Oral Daily Schnier, Dolores Lory, MD   10 mg at 10/22/18 0905  . atorvastatin (LIPITOR) tablet 40 mg  40 mg Oral QHS Schnier, Dolores Lory, MD   40 mg at  10/22/18 2250  . budesonide (PULMICORT) nebulizer solution 0.5 mg  0.5 mg Nebulization BID Schnier, Dolores Lory, MD   0.5 mg at 10/23/18 0755  . carvedilol (COREG) tablet 3.125 mg  3.125 mg Oral Daily Schnier, Dolores Lory, MD   3.125 mg at 10/22/18 0905  . Chlorhexidine Gluconate Cloth 2 % PADS 6 each  6 each Topical Q0600 Schnier, Dolores Lory, MD   6 each at 10/23/18 [phone]  . feeding supplement (NEPRO CARB STEADY) liquid 237 mL  237 mL Oral BID BM Schnier, Dolores Lory, MD   237 mL at 10/23/18 0948  . gabapentin (NEURONTIN) capsule 100 mg  100 mg Oral BID Delana Meyer Dolores Lory, MD   100 mg at 10/23/18 0947  . Gerhardt's butt cream   Topical BID Schnier, Dolores Lory, MD      . guaiFENesin-dextromethorphan [facility] DM) 100-10 MG/5ML syrup 5 mL  5 mL Oral Q4H PRN Schnier, Dolores Lory, MD   5 mL at 10/22/18 2253  . hydrALAZINE (APRESOLINE) injection 5 mg  5 mg Intravenous Q6H PRN Schnier, Dolores Lory, MD      . HYDROmorphone (DILAUDID) injection 1 mg  1 mg Intravenous Once PRN Schnier, Dolores Lory, MD      . hydrOXYzine (ATARAX/VISTARIL) tablet 25 mg  25 mg Oral TID PRN Delana Meyer Dolores Lory, MD   25 mg at 10/21/18 2156  . insulin aspart (novoLOG) injection 0-15 Units  0-15 Units Subcutaneous TID WC Schnier, Dolores Lory, MD   2 Units at 10/23/18 1756  . insulin aspart (novoLOG) injection 0-5 Units  0-5 Units Subcutaneous QHS Schnier, Dolores Lory, MD   2 Units at 10/22/18 2254  . insulin aspart (novoLOG) injection 5 Units  5 Units Subcutaneous TID WC Schnier, Dolores Lory, MD   5 Units at 10/23/18 1757  . insulin glargine (LANTUS) injection 20 Units  20 Units Subcutaneous BID Schnier, Dolores Lory, MD   20 Units at 10/23/18 [phone]  . ipratropium (ATROVENT) nebulizer solution 0.5 mg  0.5 mg Nebulization Q6H PRN Schnier, Dolores Lory, MD   0.5 mg at 10/19/18 2338  . isosorbide mononitrate (IMDUR) 24 hr tablet 30 mg  30 mg Oral Daily Schnier, Dolores Lory, MD   30 mg at 10/23/18 0947  . lactulose (CHRONULAC) 10 GM/15ML solution 20 g  20 g Oral  BID Schnier, Dolores Lory, MD   20 g at 10/23/18 0948  . multivitamin (RENA-VIT) tablet 1 tablet  1 tablet  Oral QHS Schnier, Dolores Lory, MD      . nitroGLYCERIN (NITROSTAT) SL tablet 0.4 mg  0.4 mg Sublingual Q5 min PRN Schnier, Dolores Lory, MD      . ondansetron [facility]) tablet 4 mg  4 mg Oral Q6H PRN Schnier, Dolores Lory, MD       Or  . ondansetron [facility]) injection 4 mg  4 mg Intravenous Q6H PRN Schnier, Dolores Lory, MD   4 mg at 10/16/18 1334  . pantoprazole (PROTONIX) EC tablet 40 mg  40 mg Oral QHS Schnier, Dolores Lory, MD   40 mg at 10/22/18 2250  . senna (SENOKOT) tablet 8.6 mg  1 tablet Oral Daily Schnier, Dolores Lory, MD   8.6 mg at 10/23/18 7353  . sertraline (ZOLOFT) tablet 100 mg  100 mg Oral QHS Schnier, Dolores Lory, MD   100 mg at 10/22/18 2250  . sodium chloride flush (NS) 0.9 % injection 3 mL  3 mL Intravenous Q12H Schnier, Dolores Lory, MD   3 mL at 10/23/18 0930  . traMADol (ULTRAM) tablet 50 mg  50 mg Oral Q12H PRN Schnier, Dolores Lory, MD   50 mg at 10/17/18 1312  . Warfarin - Pharmacist Dosing Inpatient   Does not apply [address] [place], [facility]          .  PHYSICAL EXAMINATION:  Vitals:   10/23/18 1600 10/23/18 1615  BP: (!) 143/80 (!) 151/69  Pulse: (!) 56 (!) 53  Resp: 18 17  Temp:    SpO2: 98% 99%   Filed Weights   10/22/18 0550 10/23/18 0517 10/23/18 1400  Weight: 246 lb 14.6 oz (112 kg) 246 lb 7.6 oz (111.8 kg) 246 lb 7.6 oz (111.8 kg)    Physical Exam  Constitutional: She is oriented to person, place, and time and well-developed, well-nourished, and in no distress.  Elderly Caucasian female.  Resting comfortably in the bed.  Undergoing dialysis.  HENT:  Head: Normocephalic and atraumatic.  Mouth/Throat: Oropharynx is clear and moist. No oropharyngeal exudate.  Eyes: Pupils are equal, round, and reactive to light.  Neck: Normal range of motion. Neck supple.  Cardiovascular: Normal rate and regular rhythm.  Pulmonary/Chest: No respiratory distress. She has no  wheezes.  Decreased air entry bilateral bases.  Abdominal: Soft. Bowel sounds are normal. She exhibits no distension and no mass. There is no abdominal tenderness. There is no rebound and no guarding.  Musculoskeletal: Normal range of motion.        General: No tenderness or edema.     Comments: Bilateral above-knee amputations.  Neurological: She is alert and oriented to person, place, and time.  Skin: Skin is warm.  Psychiatric: Affect normal.     LABORATORY DATA:  I have reviewed the data as listed Lab Results  Component Value Date   WBC 41.7 (H) 10/23/2018   HGB 11.1 (L) 10/23/2018   HCT 37.0 10/23/2018   MCV 85.1 10/23/2018   PLT 133 (L) 10/23/2018   Recent Labs    04/14/18 1343  10/16/18 0405 10/17/18 0530  10/21/18 0426 10/22/18 0433 10/23/18 0726  NA 137   < > 135 135   < > 131* 132* 134*  K 5.0   < > 4.2 4.2   < > 4.0 4.0 4.4  CL 102   < > 101 100   < > 97* 98 102  CO2 24   < > 25 25   < > 23 23 20*  GLUCOSE 119*   < >  185* 95   < > 310* 209* 185*  BUN 41*   < > 38* 52*   < > 111* 114* 117*  CREATININE 3.35*   < > 3.01* 3.60*   < > 4.27* 4.19* 3.87*  CALCIUM 8.5*   < > 8.6* 8.5*   < > 8.4* 8.2* 8.3*  GFRNONAA 13*   < > 15* 12*   < > 10* 10* 11*  GFRAA 15*   < > 17* 14*   < > 11* 12* 13*  PROT 7.3  --  7.3 7.0  --   --   --   --   ALBUMIN 3.7   < > 4.2 3.7  --   --  3.6  --   AST 22  --  36 45*  --   --   --   --   ALT 12  --  22 25  --   --   --   --   ALKPHOS 74  --  84 67  --   --   --   --   BILITOT 1.8*  --  1.3* 0.8  --   --   --   --    < > = values in this interval not displayed.    RADIOGRAPHIC STUDIES: I have personally reviewed the radiological images as listed and agreed with the findings in the report. Dg Chest 2 View  Result Date: 10/16/2018 CLINICAL DATA:  Initial evaluation for acute cough. EXAM: CHEST - 2 VIEW COMPARISON:  Prior radiograph from 04/14/2018. FINDINGS: Left-sided pacemaker/AICD in place. Median sternotomy wires noted. Stable  cardiomegaly. Mediastinal silhouette normal. Aortic atherosclerosis. Lungs mildly hypoinflated. No focal infiltrates. Mild perihilar vascular congestion without overt pulmonary edema. No pleural effusion. No pneumothorax. No acute osseous finding. IMPRESSION: 1. Cardiomegaly with mild perihilar vascular congestion without overt pulmonary edema. 2. No other active cardiopulmonary disease. Electronically Signed   By: Jeannine Boga M.D.   On: 10/16/2018 04:54   US Renal  Result Date: 10/20/2018 CLINICAL DATA:  Acute renal failure EXAM: RENAL / URINARY TRACT ULTRASOUND COMPLETE COMPARISON:  None. FINDINGS: Right Kidney: Renal measurements: 8.9 x 4.4 x 5.5 cm = volume: 113 mL . Echogenicity within normal limits. No mass or hydronephrosis visualized. Left Kidney: Renal measurements: 11.4 x 7.2 x 5.7 cm = volume: 250 mL. Echogenicity within normal limits. No mass or hydronephrosis visualized. Bladder: Poorly visualized/decompressed by indwelling Foley catheter. IMPRESSION: No hydronephrosis. Bladder decompressed by indwelling Foley catheter. Electronically Signed   By: Julian Hy M.D.   On: 10/20/2018 17:19    CLL (chronic lymphocytic leukemia) ([place]) #74 year old female patient with multiple medical problems including CLL CHF COPD CKD-acute renal failure currently on dialysis A. 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  No concerns for any overt progression of CLL at this time.  Hedman count slightly elevated from baseline-likely reactive.  Mild thrombocytopenia likely secondary to CLL.  Recommend checking quantitative immunoglobulin.  #UTI-status post antibiotics; awaiting ID evaluation.  #CKD/AKI-on dialysis.  As per nephrology.  #A. fib on Coumadin/CAD CHF/PVD.  Thank you Dr.Pyreddy for allowing me to participate in the care of your pleasant patient. Please do not hesitate to contact me with questions or concerns in the interim.     All questions  were answered. The patient knows to call the clinic with any problems, questions or concerns.    Cammie Sickle, MD 10/23/2018 7:13 PM

## 2018-10-23 NOTE — Progress Notes (Signed)
ANTICOAGULATION CONSULT NOTE -   Pharmacy Consult for Warfarin Indication: atrial fibrillation  Allergies  Allergen Reactions  . Piperacillin Other (See Comments)    Renal failure  . 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 See ID note**PER PT-CAUSED RENAL FAILURE**  . Zosyn [Piperacillin Sod-Tazobactam So] Other (See Comments)    Renal failure    Patient Measurements: Height: 5\' 8"  (172.7 cm) Weight: 246 lb 7.6 oz (111.8 kg) IBW/kg (Calculated) : 63.9   Vital Signs: Temp: 97.5 F (36.4 C) (02/12 0809) Temp Source: Oral (02/12 0809) BP: 118/96 (02/12 0809) Pulse Rate: 118 (02/12 0809)  Labs: Recent Labs    10/21/18 0426  10/21/18 1658 10/22/18 0433 10/22/18 1026 10/22/18 2254 10/23/18 0726  HGB  --    < > 11.4* 11.1*  --   --  11.1*  HCT  --   --  36.7 35.3*  --   --  37.0  PLT  --   --  123* 130*  --   --  133*  LABPROT 23.6*  --   --  19.3*  --   --  15.7*  INR 2.14  --   --  1.65  --   --  1.26  HEPARINUNFRC  --   --   --   --  0.55 0.27* 1.06*  CREATININE 4.27*  --   --  4.19*  --   --  3.87*   < > = values in this interval not displayed.    Estimated Creatinine Clearance: 17.7 mL/min (A) (by C-G formula based on SCr of 3.87 mg/dL (H)).   Medical History: Past Medical History:  Diagnosis Date  . Amputation of left lower extremity below knee ([place])   . Amputation of right lower extremity below knee ([place])   . CAD (coronary artery disease)    a. 2004 Cardiac Arrest/CABG x 3 (LIMA->LAD, VG->OM, VG->RCA);  b. 06/2015 lexiscan MV: no significant ischemia, EF 48%, low risk->Med Rx.  . Chronic combined systolic and diastolic CHF (congestive heart failure) ([place])    a. 12/2014 Echo: EF 45-50%;  b. 08/2015 Echo: EF 45-50%, ant, antsept HK, mildly dil LA, nl RV, mild-mod TR, sev PAH (12mmHg); b. 08/2017 TEE: EF 40-45%, large PFO w/ L->R shunting.  . CKD (chronic kidney disease), stage IV ([place])   . CLL (chronic lymphocytic  leukemia) ([place])   . Diabetes mellitus without complication ([place])   . Essential hypertension   . GERD (gastroesophageal reflux disease)   . Hiatal hernia   . History of cardiac arrest    a. 2004.  Marland Kitchen Hyperlipidemia   . Ischemic cardiomyopathy    a. s/p MDT ICD (originally had 8101 lead-->gen change and lead revision ~ 2012 @ [place]); b. 12/2014 Echo: EF 45-50%;  c.08/2015 Echo: EF 45-50%; d. 08/2017 Echo: EF 40-45%.  Marland Kitchen PAD (peripheral artery disease) (HCC)    a. s/p bilat BKA  . Persistent atrial fibrillation    a. Dx 12/2014.  CHA2DS2VASc = 6--> warfarin;  b. 09/2015 s/p DCCV-->on amio; c. s/p DCCV 04/25/16; d. 07/2016 s/p RFCA/PVI in setting of recurrent afib despite amio; e. 05/2018 Recurrent afib.    Assessment: Patient is a 73yo female admitted for SOB. Pharmacy consulted for management of Warfarin for afib.   WARFARIN COURSE DATE INR DOSE 2/5 2.65 4 mg 2/6 3.13 HELD 2/7 3.96     HELD 2/8       3.82     HELD 2/9  3.64     HELD 2/10     2.14     HELD (on heparin drip) 2/11     1.65     HELD (on heparin drip) 2/12     1.06     Restarted at 4mg  (heparin drip stoppped @ 2pm)  Goal of Therapy:  INR 2-3   Plan:  Restart Warfarin at home dose of 4mg  - Recheck INR/CBC with am labs   [place], PharmD, BCPS Clinical Pharmacist 10/23/2018 1:59 PM

## 2018-10-24 ENCOUNTER — Inpatient Hospital Stay: Payer: Medicare HMO

## 2018-10-24 DIAGNOSIS — N186 End stage renal disease: Secondary | ICD-10-CM

## 2018-10-24 DIAGNOSIS — Z95828 Presence of other vascular implants and grafts: Secondary | ICD-10-CM

## 2018-10-24 LAB — CBC WITH DIFFERENTIAL/PLATELET
Abs Immature Granulocytes: 0.13 10*3/uL — ABNORMAL HIGH (ref 0.00–0.07)
Basophils Absolute: 0.1 10*3/uL (ref 0.0–0.1)
Basophils Relative: 0 %
Eosinophils Absolute: 0 10*3/uL (ref 0.0–0.5)
Eosinophils Relative: 0 %
HEMATOCRIT: 34.7 % — AB (ref 36.0–46.0)
Hemoglobin: 10.7 g/dL — ABNORMAL LOW (ref 12.0–15.0)
Immature Granulocytes: 0 %
Lymphocytes Relative: 77 %
Lymphs Abs: 26.5 10*3/uL — ABNORMAL HIGH (ref 0.7–4.0)
MCH: 25.3 pg — ABNORMAL LOW (ref 26.0–34.0)
MCHC: 30.8 g/dL (ref 30.0–36.0)
MCV: 82 fL (ref 80.0–100.0)
Monocytes Absolute: 0.7 10*3/uL (ref 0.1–1.0)
Monocytes Relative: 2 %
Neutro Abs: 7.2 10*3/uL (ref 1.7–7.7)
Neutrophils Relative %: 21 %
Platelets: 140 10*3/uL — ABNORMAL LOW (ref 150–400)
RBC: 4.23 MIL/uL (ref 3.87–5.11)
RDW: 18.5 % — ABNORMAL HIGH (ref 11.5–15.5)
Smear Review: NORMAL
WBC: 34.6 10*3/uL — ABNORMAL HIGH (ref 4.0–10.5)
nRBC: 0 % (ref 0.0–0.2)

## 2018-10-24 LAB — GLUCOSE, CAPILLARY
GLUCOSE-CAPILLARY: 178 mg/dL — AB (ref 70–99)
Glucose-Capillary: 144 mg/dL — ABNORMAL HIGH (ref 70–99)
Glucose-Capillary: 126 mg/dL — ABNORMAL HIGH (ref 70–99)

## 2018-10-24 LAB — PROTIME-INR
INR: 1.12
Prothrombin Time: 14.3 seconds (ref 11.4–15.2)

## 2018-10-24 LAB — HEPARIN LEVEL (UNFRACTIONATED): Heparin Unfractionated: 0.79 IU/mL — ABNORMAL HIGH (ref 0.30–0.70)

## 2018-10-24 MED ORDER — WARFARIN SODIUM 6 MG PO TABS
6.0000 mg | ORAL_TABLET | Freq: Once | ORAL | Status: DC
  Filled 2018-10-24: qty 1

## 2018-10-24 MED ORDER — HEPARIN (PORCINE) 25000 UT/250ML-% IV SOLN
900.0000 [IU]/h | INTRAVENOUS | Status: DC
  Administered 2018-10-24: 1000 [IU]/h via INTRAVENOUS
  Filled 2018-10-24: qty 250

## 2018-10-24 MED ORDER — CLINDAMYCIN PHOSPHATE 300 MG/50ML IV SOLN
300.0000 mg | Freq: Once | INTRAVENOUS | Status: DC
  Filled 2018-10-24: qty 50

## 2018-10-24 MED ORDER — HEPARIN BOLUS VIA INFUSION
4000.0000 [IU] | Freq: Once | INTRAVENOUS | Status: AC
  Administered 2018-10-24: 4000 [IU] via INTRAVENOUS
  Filled 2018-10-24: qty 4000

## 2018-10-24 NOTE — Plan of Care (Signed)
  Problem: Education: Goal: Ability to demonstrate management of disease process will improve Outcome: Progressing Goal: Ability to verbalize understanding of medication therapies will improve Outcome: Progressing   Problem: Activity: Goal: Capacity to carry out activities will improve Outcome: Progressing   Problem: Cardiac: Goal: Ability to achieve and maintain adequate cardiopulmonary perfusion will improve Outcome: Progressing   

## 2018-10-24 NOTE — Progress Notes (Signed)
HD TX End   10/24/18 1815  Vital Signs  Pulse Rate (!) 48  Resp 14  BP (!) 147/65  Oxygen Therapy  SpO2 99 %  During Hemodialysis Assessment  Blood Flow Rate (mL/min) 250 mL/min  Arterial Pressure (mmHg) -110 mmHg  Venous Pressure (mmHg) 100 mmHg  Transmembrane Pressure (mmHg) 50 mmHg  Ultrafiltration Rate (mL/min) 600 mL/min  Dialysate Flow Rate (mL/min) 500 ml/min  Conductivity: Machine  14  HD Safety Checks Performed Yes  KECN 35.9 KECN  Dialysis Fluid Bolus Normal Saline  Bolus Amount (mL) 250 mL  Intra-Hemodialysis Comments Tx completed;Tolerated well

## 2018-10-24 NOTE — Progress Notes (Signed)
Pre HD Tx Assessment Running asymptomatic bradycardia pre tx (HR in 40's-50's) MD aware. Other vitals stable, pt calm cooperative and lung sounds reveal course crackles/rhonchi throughout bilaterally. Goal UF removal 1kg today, totaling to 1500 mL with rinseback/prime.    10/24/18 1520  Neurological  Level of Consciousness Alert  Orientation Level Oriented X4  Respiratory  Respiratory Pattern Labored  Chest Assessment Chest expansion symmetrical  Bilateral Breath Sounds Rhonchi  R Upper  Breath Sounds Rhonchi  L Upper Breath Sounds Rhonchi  R Lower Breath Sounds Rhonchi  L Lower Breath Sounds Rhonchi  Cough Non-productive  Sputum Amount Moderate  Cardiac  Pulse Irregular  Heart Sounds Distant  ECG Monitor Yes  Cardiac Rhythm Atrial fibrillation  Vascular  R Radial Pulse +2  L Radial Pulse +2  Edema Generalized  Generalized Edema +1  Integumentary  Integumentary (WDL) X  Skin Color Appropriate for ethnicity  Additional Integumentary Comments  (CVC HD cath in right upper chest)  Musculoskeletal  Musculoskeletal (WDL) X  Generalized Weakness Yes  GU Assessment  Genitourinary (WDL) X  Genitourinary Symptoms  (HD  )  Psychosocial  Psychosocial (WDL) WDL

## 2018-10-24 NOTE — Progress Notes (Addendum)
Pt HR is going from a-fib 49-55 and have a scheduled amiodarone 400 mg tonight. Talked to dr. Molli Hazard and ordered to hold Amiodarone tonight. Will continue to monitor.  Update 0210: CCMD called and reported HR 39. Pt asymptomatic. VSS. Notify Prime. Will continue to monitor.  Update 0230: Dr. Buck Mam made aware but no new order was placed. No new order was placed.

## 2018-10-24 NOTE — Progress Notes (Signed)
HD Tx Start   10/24/18 1545  Vital Signs  Pulse Rate (!) 43  Resp 16  BP (!) 148/65  Oxygen Therapy  SpO2 100 %  During Hemodialysis Assessment  Blood Flow Rate (mL/min) 250 mL/min  Arterial Pressure (mmHg) -100 mmHg  Venous Pressure (mmHg) 80 mmHg  Transmembrane Pressure (mmHg) 50 mmHg  Ultrafiltration Rate (mL/min) 600 mL/min  Dialysate Flow Rate (mL/min) 500 ml/min  Conductivity: Machine  14  HD Safety Checks Performed Yes  Dialysis Fluid Bolus Normal Saline  Bolus Amount (mL) 250 mL  Intra-Hemodialysis Comments Tx initiated (Noted bradycardia, MD aware at bedside, asymptomatic)

## 2018-10-24 NOTE — Progress Notes (Signed)
Pre HD Tx   10/24/18 1520  Vital Signs  Temp 98.2 F (36.8 C)  Temp Source Oral  Pulse Rate (!) 49  Pulse Rate Source Monitor  Resp 16  BP (!) 147/66  BP Location Right Arm  BP Method Automatic  Patient Position (if appropriate) Lying  Oxygen Therapy  SpO2 100 %  O2 Device Nasal Cannula  O2 Flow Rate (L/min) 3 L/min  Pain Assessment  Pain Scale 0-10  Pain Score 0  Dialysis Weight  Weight 109 kg  Type of Weight Pre-Dialysis  Time-Out for Hemodialysis  What Procedure?  (HD)  Pt Identifiers(min of two) First/Last Name;MRN/Account#  Correct Site? Yes  Correct Side? Yes  Correct Procedure? Yes  Consents Verified? Yes  Rad Studies Available? No  Safety Precautions Reviewed? Yes  Engineer, civil (consulting) Number 4  Station Number 3  UF/Alarm Test Passed  Conductivity: Meter 14  Conductivity: Machine  14  pH 7.4  Reverse Osmosis main  Normal Saline Lot Number [id number]  Dialyzer Lot Number [id number]  Disposable Set Lot Number [id number]  Machine Temperature 98.6 F (37 C)  Musician and Audible Yes  Blood Lines Intact and Secured Yes  Pre Treatment Patient Checks  Vascular access used during treatment Catheter  Hepatitis B Surface Antigen Results Negative  Date Hepatitis B Surface Antigen Drawn 10/21/18  Isolation Initiated Yes  Hepatitis B Surface Antibody  (<10)  Date Hepatitis B Surface Antibody Drawn 10/21/18  Hemodialysis Consent Verified Yes  Hemodialysis Standing Orders Initiated Yes  ECG (Telemetry) Monitor On Yes  Prime Ordered Normal Saline  Length of  DialysisTreatment -hour(s) 2.5 Hour(s)  Dialyzer Elisio 17H NR  Dialysate 2K, 2.5 Ca  Dialysate Flow Ordered 500  Blood Flow Rate Ordered 250 mL/min  Ultrafiltration Goal 1 Liters  Dialysis Blood Pressure Support Ordered Normal Saline  Education / Care Plan  Dialysis Education Provided Yes  Documented Education in Care Plan Yes

## 2018-10-24 NOTE — Progress Notes (Signed)
Physical Therapy Treatment Patient Details Name: Caroline Hernandez MRN: [id number] DOB: Aug 19, 1946 Today's Date: 10/24/2018    History of Present Illness 73 yo female with onset of CHF and 20# wgt gain was admitted to diurese, and noted elevated troponin and had foul smelling urine.  Nonproductive cough, congestion, UTI, ABT started.  Permcath placed 2/11 by vascular. PMHx:  B BK amputation, CAD, MI, CHF, CKD, CLL, hiatal hernia, ischemic cardiomyopathy, a-fib    PT Comments    Caroline Hernandez limited this session due to decrease in HR and dizziness. Pt on 3L O2 and SpO2 did not drop below 98% throughout entire treatment. Min guard for sup> sit. In supine resting HR 52, HR in supine with exercises 55-58. Pt reported 9/10 dizziness with inital movement to go from supine to sit, pt instructed to rest, dizziness decreased to 6/10 and BP in sitting 163/73. Pt sat at EOB where she reported 5/10 dizziness. HR in sitting at EOB dropped as low as 43 and went as high as 65. With HR drop to 43 SPT instructed pt to get into supine.  Multiform PVCs and Run on PVCs noted while pt in sitting, with each occurance pt instructed to take a break. BP resting in supine 151/60. RN notified of BP, HR, and PVC's. Current discharge plan remains appropriate.    Follow Up Recommendations  SNF     Equipment Recommendations  Other (comment);None recommended by PT(prosthetic consult for outpatient)    Recommendations for Other Services       Precautions / Restrictions Precautions Precautions: Fall Restrictions Weight Bearing Restrictions: No    Mobility  Bed Mobility Overal bed mobility: Needs Assistance Bed Mobility: Supine to Sit;Sit to Supine     Supine to sit: Min guard Sit to supine: Min guard   General bed mobility comments: Pt on 3L O2. Pt able to go sup<>sit with HOB elevated and min guard for safety. With initial movement pt reported 9/10 dizziness that decreased to 6/10 with rest in supine. Sitting at EOB  pt reported 5/10 dizziness. BP in sitting 163/73 and 151/60 following mobility in supine.  Transfers                 General transfer comment: unabble to attempt this session due to decrease in HR to 43  Ambulation/Gait                 Stairs             Wheelchair Mobility    Modified [place] (Stroke Patients Only)       Balance Overall balance assessment: Needs assistance Sitting-balance support: Single extremity supported Sitting balance-Leahy Scale: Fair Sitting balance - Comments: Pt able to maintain balance with intermittent use of single UE, if challenged suspect pt to lose balance.        Standing balance comment: unable to attempt this session due to HR                             Cognition Arousal/Alertness: Awake/alert Behavior During Therapy: WFL for tasks assessed/performed Overall Cognitive Status: Within Functional Limits for tasks assessed                                        Exercises General Exercises - Lower Extremity Quad Sets: Strengthening;Both;10 reps;Supine Hip ABduction/ADduction: AROM;Strengthening;Both;10 reps;Supine Straight Leg Raises: Strengthening;Both;10  reps;Supine    General Comments General comments (skin integrity, edema, etc.): Pt on 3L O2 and SpO2 did not drop below 98% throughout entire treatment. In supine HR 52, HR in supine with exercises 55-58. Pt reported 9/10 dizziness with inital movement to go from supine to sit, pt instructed to rest, dizziness decreased to 6/10. Pt sat at EOB where she reported 5/10 dizziness. BP in sitting 163/73. Multiform PVCs and Run on PVCs noted while pt in sitting, HR in sitting at EOB dropped as low as 43 and went as high as 65. With HR drop to 43 SPT instructed pt to get into supine. BP resting in supine 151/60. RN notified of BP, HR, and PVC's.      Pertinent Vitals/Pain Pain Assessment: No/denies pain    Home Living                       Prior Function            PT Goals (current goals can now be found in the care plan section) Acute Rehab PT Goals Patient Stated Goal: to get home and feel stronger PT Goal Formulation: With patient Time For Goal Achievement: 11/02/18 Potential to Achieve Goals: Good Progress towards PT goals: Not progressing toward goals - comment(decreased HR)    Frequency    Min 2X/week      PT Plan Current plan remains appropriate    Co-evaluation              AM-PAC PT "6 Clicks" Mobility   Outcome Measure  Help needed turning from your back to your side while in a flat bed without using bedrails?: A Little Help needed moving from lying on your back to sitting on the side of a flat bed without using bedrails?: A Little Help needed moving to and from a bed to a chair (including a wheelchair)?: A Little Help needed standing up from a chair using your arms (e.g., wheelchair or bedside chair)?: A Little Help needed to walk in hospital room?: A Little Help needed climbing 3-5 steps with a railing? : A Lot 6 Click Score: 17    End of Session Equipment Utilized During Treatment: Gait belt;Oxygen Activity Tolerance: Treatment limited secondary to medical complications (Comment)(decreased HR, dizziness ) Patient left: in bed;with call bell/phone within reach;with bed alarm set;with nursing/sitter in room Nurse Communication: Mobility status;Other (comment)(HR, BP, PVCs) PT Visit Diagnosis: Unsteadiness on feet (R26.81);Other abnormalities of gait and mobility (R26.89);Difficulty in walking, not elsewhere classified (R26.2)     Time: 1245-8099 PT Time Calculation (min) (ACUTE ONLY): 29 min  Charges:  $Therapeutic Exercise: 8-22 mins $Therapeutic Activity: 8-22 mins             Dorothy Spark, SPT  10/24/2018, 3:47 PM

## 2018-10-24 NOTE — Progress Notes (Signed)
[facility]  ROUNDING NOTE   Subjective:   First hemodialysis treatment yesterday 2/12.   Wbc 34.6  Seen and examined on second hemodialysis treatment.     HEMODIALYSIS FLOWSHEET:  Blood Flow Rate (mL/min): 200 mL/min Arterial Pressure (mmHg): -60 mmHg Venous Pressure (mmHg): 80 mmHg Transmembrane Pressure (mmHg): 30 mmHg Ultrafiltration Rate (mL/min): 250 mL/min Dialysate Flow Rate (mL/min): 300 ml/min Conductivity: Machine : 14 Conductivity: Machine : 14 Dialysis Fluid Bolus: Normal Saline Bolus Amount (mL): 250 mL    Objective:  Vital signs in last 24 hours:  Temp:  [97.5 F (36.4 C)-97.8 F (36.6 C)] 97.6 F (36.4 C) (02/13 0801) Pulse Rate:  [46-64] 55 (02/13 0801) Resp:  [14-18] 18 (02/13 0430) BP: (141-163)/(61-113) 163/69 (02/13 0801) SpO2:  [95 %-99 %] 98 % (02/13 0801) Weight:  [109 kg-111.8 kg] 109 kg (02/13 0430)  Weight change: 0 kg Filed Weights   10/23/18 0517 10/23/18 1400 10/24/18 0430  Weight: 111.8 kg 111.8 kg 109 kg    Intake/Output: I/O last 3 completed shifts: In: 236 [P.O.:236] Out: 1500 [Urine:1500]   Intake/Output this shift:  No intake/output data recorded.  Physical Exam: General: No acute distress  Head: Normocephalic, atraumatic. Moist oral mucosal membranes  Eyes: Anicteric  Neck: Supple, trachea midline  Lungs:  Scattered rhonchi, normal effort  Heart: S1S2 no rubs  Abdomen:  Soft, nontender, bowel sounds present  Extremities: B/L BKA  Neurologic: Awake, alert, following commands  Skin: No lesions  Access:  RIJ permcath 2/12 Dr. Delana Meyer    Basic Metabolic Panel: Recent Labs  Lab 10/19/18 0507 10/20/18 0244 10/21/18 0426 10/22/18 0433 10/23/18 0726  NA 130* 132* 131* 132* 134*  K 4.3 4.1 4.0 4.0 4.4  CL 96* 97* 97* 98 102  CO2 23 23 23 23  20*  GLUCOSE 309* 305* 310* 209* 185*  BUN 86* 98* 111* 114* 117*  CREATININE 3.97* 3.98* 4.27* 4.19* 3.87*  CALCIUM 8.7* 8.4* 8.4* 8.2* 8.3*  MG  --   --    --  2.7*  --   PHOS  --   --   --  5.5*  --     Liver Function Tests: Recent Labs  Lab 10/22/18 0433  ALBUMIN 3.6   No results for input(s): LIPASE, AMYLASE in the last 168 hours. No results for input(s): AMMONIA in the last 168 hours.  CBC: Recent Labs  Lab 10/21/18 1658 10/22/18 0433 10/23/18 0726 10/24/18 0613  WBC 31.0* 36.1* 41.7* 34.6*  NEUTROABS  --   --   --  7.2  HGB 11.4* 11.1* 11.1* 10.7*  HCT 36.7 35.3* 37.0 34.7*  MCV 81.0 80.6 85.1 82.0  PLT 123* 130* 133* 140*    Cardiac Enzymes: No results for input(s): CKTOTAL, CKMB, CKMBINDEX, TROPONINI in the last 168 hours.  BNP: Invalid input(s): POCBNP  CBG: Recent Labs  Lab 10/23/18 0806 10/23/18 1143 10/23/18 1750 10/23/18 2100 10/24/18 0803  GLUCAP 191* 166* 132* 163* 178*    Microbiology: Results for orders placed or performed during the hospital encounter of 10/16/18  Respiratory Panel by PCR     Status: Abnormal   Collection Time: 10/16/18  4:05 AM  Result Value Ref Range Status   Adenovirus NOT DETECTED NOT DETECTED Final   Coronavirus 229E NOT DETECTED NOT DETECTED Final    Comment: (NOTE) The Coronavirus on the Respiratory Panel, DOES NOT test for the novel  Coronavirus (2019 nCoV)    Coronavirus HKU1 NOT DETECTED NOT DETECTED Final   Coronavirus  NL63 NOT DETECTED NOT DETECTED Final   Coronavirus OC43 NOT DETECTED NOT DETECTED Final   Metapneumovirus DETECTED (A) NOT DETECTED Final   Rhinovirus / Enterovirus NOT DETECTED NOT DETECTED Final   Influenza A NOT DETECTED NOT DETECTED Final   Influenza B NOT DETECTED NOT DETECTED Final   Parainfluenza Virus 1 NOT DETECTED NOT DETECTED Final   Parainfluenza Virus 2 NOT DETECTED NOT DETECTED Final   Parainfluenza Virus 3 NOT DETECTED NOT DETECTED Final   Parainfluenza Virus 4 NOT DETECTED NOT DETECTED Final   Respiratory Syncytial Virus NOT DETECTED NOT DETECTED Final   Bordetella pertussis NOT DETECTED NOT DETECTED Final   Chlamydophila  pneumoniae NOT DETECTED NOT DETECTED Final   Mycoplasma pneumoniae NOT DETECTED NOT DETECTED Final    Comment: Performed at [facility], [place] [address] [place], [place] [address]  Urine culture     Status: Abnormal   Collection Time: 10/16/18  4:23 AM  Result Value Ref Range Status   Specimen Description URINE, CLEAN CATCH  Final   Special Requests   Final    NONE Performed at [facility], [address] [place], [place] [address]    Culture >=100,000 COLONIES/mL KLEBSIELLA PNEUMONIAE (A)  Final   Report Status 10/19/2018 FINAL  Final   Organism ID, Bacteria KLEBSIELLA PNEUMONIAE (A)  Final      Susceptibility   Klebsiella pneumoniae - MIC*    AMPICILLIN RESISTANT Resistant     CEFAZOLIN <=4 SENSITIVE Sensitive     CEFTRIAXONE <=1 SENSITIVE Sensitive     CIPROFLOXACIN <=0.25 SENSITIVE Sensitive     GENTAMICIN <=1 SENSITIVE Sensitive     IMIPENEM <=0.25 SENSITIVE Sensitive     NITROFURANTOIN <=16 SENSITIVE Sensitive     TRIMETH/SULFA <=20 SENSITIVE Sensitive     AMPICILLIN/SULBACTAM 4 SENSITIVE Sensitive     PIP/TAZO <=4 SENSITIVE Sensitive     Extended ESBL NEGATIVE Sensitive     * >=100,000 COLONIES/mL KLEBSIELLA PNEUMONIAE  MRSA PCR Screening     Status: Abnormal   Collection Time: 10/16/18  5:13 PM  Result Value Ref Range Status   MRSA by PCR POSITIVE (A) NEGATIVE Final    Comment:        The GeneXpert MRSA Assay (FDA approved for NASAL specimens only), is one component of a comprehensive MRSA colonization surveillance program. It is not intended to diagnose MRSA infection nor to guide or monitor treatment for MRSA infections. RESULT CALLED TO, READ BACK BY AND VERIFIED WITH: MAI QUIARORO 10/16/18 @ 1838  [place] Performed at [facility], [address] [place], [place] [address]   CULTURE, BLOOD (ROUTINE X 2) w Reflex to ID Panel     Status: None (Preliminary result)   Collection Time: 10/24/18 12:11 AM  Result Value Ref Range Status    Specimen Description BLOOD RIGHT ASSIST CONTROL  Final   Special Requests   Final    BOTTLES DRAWN AEROBIC AND ANAEROBIC Blood Culture adequate volume   Culture   Final    NO GROWTH < 12 HOURS Performed at [facility], [address], [place], [place] [address]    Report Status PENDING  Incomplete  CULTURE, BLOOD (ROUTINE X 2) w Reflex to ID Panel     Status: None (Preliminary result)   Collection Time: 10/24/18 12:17 AM  Result Value Ref Range Status   Specimen Description BLOOD RIGHT HAND  Final   Special Requests   Final    BOTTLES DRAWN AEROBIC AND ANAEROBIC Blood  Culture adequate volume   Culture   Final    NO GROWTH < 12 HOURS Performed at [facility], [address] [place], [place] [address]    Report Status PENDING  Incomplete    Coagulation Studies: Recent Labs    10/22/18 0433 10/23/18 0726 10/24/18 0613  LABPROT 19.3* 15.7* 14.3  INR 1.65 1.26 1.12    Urinalysis: No results for input(s): COLORURINE, LABSPEC, PHURINE, GLUCOSEU, HGBUR, BILIRUBINUR, KETONESUR, PROTEINUR, UROBILINOGEN, NITRITE, LEUKOCYTESUR in the last 72 hours.  Invalid input(s): APPERANCEUR    Imaging: Dg Chest 2 View  Result Date: 10/24/2018 CLINICAL DATA:  Cough.  Leukocytosis. EXAM: CHEST - 2 VIEW COMPARISON:  10/16/2018 FINDINGS: Previous median sternotomy. Pacemaker AICD appears unchanged. Newly seen right internal jugular central line tip at the SVC RA junction. Mild cardiomegaly as seen previously. The right lung remains clear. Mild patchy density at the left base could be mild left base atelectasis or mild left lower lobe pneumonia. No acute bone finding. IMPRESSION: Patchy density in the left lower lobe could be atelectasis or pneumonia. Electronically Signed   By: Nelson Chimes M.D.   On: 10/24/2018 10:54     Medications:   . sodium chloride Stopped (10/22/18 0104)   . albuterol  2.5 mg Nebulization TID  . amiodarone  400 mg Oral BID  . amitriptyline  25 mg  Oral QHS  . amLODipine  10 mg Oral Daily  . atorvastatin  40 mg Oral QHS  . budesonide (PULMICORT) nebulizer solution  0.5 mg Nebulization BID  . carvedilol  3.125 mg Oral Daily  . Chlorhexidine Gluconate Cloth  6 each Topical Q0600  . feeding supplement (NEPRO CARB STEADY)  237 mL Oral BID BM  . gabapentin  100 mg Oral BID  . Gerhardt's butt cream   Topical BID  . insulin aspart  0-15 Units Subcutaneous TID WC  . insulin aspart  0-5 Units Subcutaneous QHS  . insulin aspart  5 Units Subcutaneous TID WC  . insulin glargine  20 Units Subcutaneous BID  . isosorbide mononitrate  30 mg Oral Daily  . lactulose  20 g Oral BID  . multivitamin  1 tablet Oral QHS  . pantoprazole  40 mg Oral QHS  . senna  1 tablet Oral Daily  . sertraline  100 mg Oral QHS  . sodium chloride flush  3 mL Intravenous Q12H  . warfarin  6 mg Oral ONCE-1800  . Warfarin - Pharmacist Dosing Inpatient   Does not apply q1800   sodium chloride, acetaminophen **OR** acetaminophen, guaiFENesin-dextromethorphan, hydrALAZINE, HYDROmorphone (DILAUDID) injection, hydrOXYzine, ipratropium, nitroGLYCERIN, ondansetron **OR** ondansetron (ZOFRAN) IV, traMADol  Assessment/ Plan:  [name] is a 73 y.o. [place] female with atrial fibrillation, hypertension, diabetes mellitus type II insulin dependent, diabetic neuropathy, congestive heart failure, coronary artery disease status post CABG, GERD, CLL, bilateral below the knee amputations admitted with cough, shortness of breath, UTI and now with acute renal failure with superimposed chronic kidney disease stage IV.  1.  End Stage Renal Disease with hyponatremia and uremia.  First dialysis 2/12 through permcath Dialysis for later today. Second treatment. Seen and examined   Outpatient planning for [facility].  Patient wants to eventually do peritoneal dialysis at home. PD catheter scheduled to be placed on Friday, 2/14.   2.  Anemia of chronic kidney disease with CLL and  leukocytosis.    Thrombocytopenia   - No indication to start EPO  3.  Urinary tract infection. Klebsiella - cephalexin  completed - Appreciate ID input  4. Diabetes mellitus type II with chronic kidney disease:insulin dependent. history of poor control.   5. Hypertension:  - amlodipine.    LOS: 8 Caroline Hernandez 2/13/202011:13 AM

## 2018-10-24 NOTE — Progress Notes (Addendum)
ANTICOAGULATION CONSULT NOTE -   Pharmacy Consult for Warfarin Indication: atrial fibrillation  Allergies  Allergen Reactions  . Piperacillin Other (See Comments)    Renal failure  . 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 See ID note**PER PT-CAUSED RENAL FAILURE**  . Zosyn [Piperacillin Sod-Tazobactam So] Other (See Comments)    Renal failure    Patient Measurements: Height: 5\' 8"  (172.7 cm) Weight: 240 lb 4.8 oz (109 kg) IBW/kg (Calculated) : 63.9   Vital Signs: Temp: 97.7 F (36.5 C) (02/13 0430) Temp Source: Oral (02/13 0430) BP: 158/61 (02/13 0430) Pulse Rate: 60 (02/13 0430)  Labs: Recent Labs    10/22/18 0433 10/22/18 1026 10/22/18 2254 10/23/18 0726 10/24/18 0613  HGB 11.1*  --   --  11.1* 10.7*  HCT 35.3*  --   --  37.0 34.7*  PLT 130*  --   --  133* 140*  LABPROT 19.3*  --   --  15.7* 14.3  INR 1.65  --   --  1.26 1.12  HEPARINUNFRC  --  0.55 0.27* 1.06*  --   CREATININE 4.19*  --   --  3.87*  --     Estimated Creatinine Clearance: 17.5 mL/min (A) (by C-G formula based on SCr of 3.87 mg/dL (H)).   Medical History: Past Medical History:  Diagnosis Date  . Amputation of left lower extremity below knee ([place])   . Amputation of right lower extremity below knee ([place])   . CAD (coronary artery disease)    a. 2004 Cardiac Arrest/CABG x 3 (LIMA->LAD, VG->OM, VG->RCA);  b. 06/2015 lexiscan MV: no significant ischemia, EF 48%, low risk->Med Rx.  . Chronic combined systolic and diastolic CHF (congestive heart failure) ([place])    a. 12/2014 Echo: EF 45-50%;  b. 08/2015 Echo: EF 45-50%, ant, antsept HK, mildly dil LA, nl RV, mild-mod TR, sev PAH (18mmHg); b. 08/2017 TEE: EF 40-45%, large PFO w/ L->R shunting.  . CKD (chronic kidney disease), stage IV ([place])   . CLL (chronic lymphocytic leukemia) ([place])   . Diabetes mellitus without complication ([place])   . Essential hypertension   . GERD (gastroesophageal reflux disease)   .  Hiatal hernia   . History of cardiac arrest    a. 2004.  Marland Kitchen Hyperlipidemia   . Ischemic cardiomyopathy    a. s/p MDT ICD (originally had 7412 lead-->gen change and lead revision ~ 2012 @ [facility]); b. 12/2014 Echo: EF 45-50%;  c.08/2015 Echo: EF 45-50%; d. 08/2017 Echo: EF 40-45%.  Marland Kitchen PAD (peripheral artery disease) ([facility])    a. s/p bilat BKA  . Persistent atrial fibrillation    a. Dx 12/2014.  CHA2DS2VASc = 6--> warfarin;  b. 09/2015 s/p DCCV-->on amio; c. s/p DCCV 04/25/16; d. 07/2016 s/p RFCA/PVI in setting of recurrent afib despite amio; e. 05/2018 Recurrent afib.    Assessment: Patient is a 73yo female admitted for SOB. Pharmacy consulted for management of Warfarin for afib.   WARFARIN COURSE DATE INR DOSE 2/5 2.65 4 mg 2/6 3.13 HELD 2/7 3.96     HELD 2/8       3.82     HELD 2/9       3.64     HELD 2/10     2.14     HELD - VitK 5mg  given (on heparin drip) 2/11     1.65     HELD (on heparin drip) 2/12     1.26     Restarted at  4mg  (heparin drip stoppped @ 2pm) 2/13     1.12       Goal of Therapy:  INR 2-3   Plan:  Will dose at 50% greater than home dose 6mg  (as INR still subtherapeutic) - Recheck INR daily with am labs and CBC at least every 3 days   (Addendum - order cancelled and dose not being given - Vascular planned procedure tomorrow 2/14 with Dr Delana Meyer for laparoscopic placement of peritoneal dialysis catheter)  Lu Duffel, PharmD, BCPS Clinical Pharmacist 10/24/2018 7:17 AM

## 2018-10-24 NOTE — Progress Notes (Signed)
[facility] [place] NAME: Caroline Hernandez    MR#:  [id number]  DATE OF BIRTH:  Jun 18, 1946  SUBJECTIVE:   The patient still has shortness of breath and cough, on oxygen via nasal cannula at 3 L REVIEW OF SYSTEMS:    Review of Systems  Constitutional: Negative.   HENT: Negative.   Eyes: Negative.   Respiratory: Cough and shortness of breath. Cardiovascular: Negative.   Gastrointestinal: Negative.   Genitourinary: Negative.   Musculoskeletal: Negative.   Skin: Negative.   Neurological: Negative.   Endo/Heme/Allergies: Negative.   Psychiatric/Behavioral: Negative.   All other systems reviewed and are negative.  DRUG ALLERGIES:   Allergies  Allergen Reactions  . Piperacillin Other (See Comments)    Renal failure  . 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 See ID note**PER PT-CAUSED RENAL FAILURE**  . Zosyn [Piperacillin Sod-Tazobactam So] Other (See Comments)    Renal failure    VITALS:  Blood pressure (!) 163/69, pulse (!) 55, temperature 97.6 F (36.4 C), temperature source Oral, resp. rate 18, height 5\' 8"  (1.727 m), weight 109 kg, SpO2 96 %.  PHYSICAL EXAMINATION:   Physical Exam  GENERAL:  73 y.o.-year-old patient lying in the bed on oxygen via nasal cannula.  Obesity. Unable to speak in full sentences without shortness of breath EYES: Pupils equal, round, reactive to light and accommodation. No scleral icterus. Extraocular muscles intact.  HEENT: Head atraumatic, normocephalic. Oropharynx and nasopharynx clear.  NECK:  Supple, no jugular venous distention. No thyroid enlargement, no tenderness.  LUNGS: Bilateral basilar rales, no wheezing or rhonchi.  No use of accessory muscles to breathe. CARDIOVASCULAR: S1, S2 normal. No murmurs, rubs, or gallops.  ABDOMEN: Soft, nontender, nondistended. Bowel sounds present. No organomegaly or mass.  EXTREMITIES: No cyanosis, clubbing or edema  b/l.   Bilateral below-knee amputations NEUROLOGIC: Cranial nerves II through XII are intact. No focal Motor or sensory deficits b/l.   PSYCHIATRIC: The patient is alert and oriented x 3.  SKIN: No obvious rash, lesion, or ulcer.   LABORATORY PANEL:   CBC Recent Labs  Lab 10/24/18 0613  WBC 34.6*  HGB 10.7*  HCT 34.7*  PLT 140*   ------------------------------------------------------------------------------------------------------------------ Chemistries  Recent Labs  Lab 10/22/18 0433 10/23/18 0726  NA 132* 134*  K 4.0 4.4  CL 98 102  CO2 23 20*  GLUCOSE 209* 185*  BUN 114* 117*  CREATININE 4.19* 3.87*  CALCIUM 8.2* 8.3*  MG 2.7*  --    ------------------------------------------------------------------------------------------------------------------  Cardiac Enzymes No results for input(s): TROPONINI in the last 168 hours. ------------------------------------------------------------------------------------------------------------------  RADIOLOGY:  Dg Chest 2 View  Result Date: 10/24/2018 CLINICAL DATA:  Cough.  Leukocytosis. EXAM: CHEST - 2 VIEW COMPARISON:  10/16/2018 FINDINGS: Previous median sternotomy. Pacemaker AICD appears unchanged. Newly seen right internal jugular central line tip at the SVC RA junction. Mild cardiomegaly as seen previously. The right lung remains clear. Mild patchy density at the left base could be mild left base atelectasis or mild left lower lobe pneumonia. No acute bone finding. IMPRESSION: Patchy density in the left lower lobe could be atelectasis or pneumonia. Electronically Signed   By: Nelson Chimes M.D.   On: 10/24/2018 10:54     ASSESSMENT AND PLAN:   *Acute bronchitis improved Status post acute hypoxic respiratory failure - oral steroids to continue - Scheduled Nebulizers - Inhalers -Wean O2 as tolerated -Respiratory panel with Metapneumovirus. -Wean oxygen as tolerated, NEB  prn.  *Acute kidney injury over CKD stage III  worsened Continue hemodialysis for 3 days per Dr. Juleen China. Access catheter was placed.  *Acute on chronic systolic congestive heart failure, 35-40%.   Continue hemodialysis.  *UTI with leukocytosis.  Klebsiella in urine cultures.  Continue oral Keflex antibiotic  *Chronic atrial fibrillation.  Continue amiodarone and Coreg Hold Coumadin Coumadin for vascular procedure per vascular surgery PA.  Start heparin drip.  *Diabetes mellitus type 2.  On sliding scale insulin along with Lantus insulin Monitor blood sugars closely to avoid hypoglycemia  *Leukocytosis, history of CLL. No evidence of any sepsis. Possible due to reaction to steroid, not due to CLL per oncology consult.   Discussed with Dr. Juleen China. All the records are reviewed and case discussed with Care Management/Social Worker Management plans discussed with the patient, family and they are in agreement.  CODE STATUS: Full code  DVT Prophylaxis: SCDs  TOTAL TIME TAKING CARE OF THIS PATIENT: 33 minutes.   POSSIBLE D/C IN 2 DAYS, DEPENDING ON CLINICAL CONDITION.  Demetrios Loll M.D on 10/24/2018 at 3:13 PM  Between 7am to 6pm - Pager - [phone]  After 6pm go to [email or web address] - password [email or web address] [facility]  Office  [phone]  CC: Primary care physician; System, Provider Not In  Note: This dictation was prepared with Dragon dictation along with smaller phrase technology. Any transcriptional errors that result from this process are unintentional.

## 2018-10-24 NOTE — Plan of Care (Signed)
  Problem: Education: Goal: Knowledge of General Education information will improve Description Including pain rating scale, medication(s)/side effects and non-pharmacologic comfort measures Outcome: Progressing   Problem: Cardiac: Goal: Ability to achieve and maintain adequate cardiopulmonary perfusion will improve Outcome: Progressing   Problem: Health Behavior/Discharge Planning: Goal: Ability to manage health-related needs will improve Outcome: Progressing Note:  Pt for PD cath insertion tomorrow (2/14) diet order entered and consent has already been obtained. Will cont to monitor status.

## 2018-10-24 NOTE — Progress Notes (Signed)
Wound care already completed on patient this AM with morning med pass. Will continue to monitor that area for the remainder of the shift for any soilage / need to re-apply cream. Daron Offer

## 2018-10-24 NOTE — Consult Note (Signed)
ANTICOAGULATION CONSULT NOTE - Initial Consult  Pharmacy Consult for Heparin infusion Indication: atrial fibrillation  Allergies  Allergen Reactions  . Piperacillin Other (See Comments)    Renal failure  . 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 See ID note**PER PT-CAUSED RENAL FAILURE**  . Zosyn [Piperacillin Sod-Tazobactam So] Other (See Comments)    Renal failure    Patient Measurements: Height: 5\' 8"  (172.7 cm) Weight: 240 lb 4.8 oz (109 kg) IBW/kg (Calculated) : 63.9 Heparin Dosing Weight: 84.8 kg  Vital Signs: Temp: 97.6 F (36.4 C) (02/13 0801) Temp Source: Oral (02/13 0801) BP: 163/69 (02/13 0801) Pulse Rate: 55 (02/13 0801)  Labs: Recent Labs    10/22/18 0433 10/22/18 1026 10/22/18 2254 10/23/18 0726 10/24/18 0613  HGB 11.1*  --   --  11.1* 10.7*  HCT 35.3*  --   --  37.0 34.7*  PLT 130*  --   --  133* 140*  LABPROT 19.3*  --   --  15.7* 14.3  INR 1.65  --   --  1.26 1.12  HEPARINUNFRC  --  0.55 0.27* 1.06*  --   CREATININE 4.19*  --   --  3.87*  --     Estimated Creatinine Clearance: 17.5 mL/min (A) (by C-G formula based on SCr of 3.87 mg/dL (H)).   Medical History: Past Medical History:  Diagnosis Date  . Amputation of left lower extremity below knee ([place])   . Amputation of right lower extremity below knee ([place])   . CAD (coronary artery disease)    a. 2004 Cardiac Arrest/CABG x 3 (LIMA->LAD, VG->OM, VG->RCA);  b. 06/2015 lexiscan MV: no significant ischemia, EF 48%, low risk->Med Rx.  . Chronic combined systolic and diastolic CHF (congestive heart failure) ([place])    a. 12/2014 Echo: EF 45-50%;  b. 08/2015 Echo: EF 45-50%, ant, antsept HK, mildly dil LA, nl RV, mild-mod TR, sev PAH (74mmHg); b. 08/2017 TEE: EF 40-45%, large PFO w/ L->R shunting.  . CKD (chronic kidney disease), stage IV ([place])   . CLL (chronic lymphocytic leukemia) ([place])   . Diabetes mellitus without complication ([place])   . Essential hypertension    . GERD (gastroesophageal reflux disease)   . Hiatal hernia   . History of cardiac arrest    a. 2004.  Marland Kitchen Hyperlipidemia   . Ischemic cardiomyopathy    a. s/p MDT ICD (originally had 4098 lead-->gen change and lead revision ~ 2012 @ [place]); b. 12/2014 Echo: EF 45-50%;  c.08/2015 Echo: EF 45-50%; d. 08/2017 Echo: EF 40-45%.  Marland Kitchen PAD (peripheral artery disease) ([facility])    a. s/p bilat BKA  . Persistent atrial fibrillation    a. Dx 12/2014.  CHA2DS2VASc = 6--> warfarin;  b. 09/2015 s/p DCCV-->on amio; c. s/p DCCV 04/25/16; d. 07/2016 s/p RFCA/PVI in setting of recurrent afib despite amio; e. 05/2018 Recurrent afib.    Medications:  Scheduled:  . albuterol  2.5 mg Nebulization TID  . amiodarone  400 mg Oral BID  . amitriptyline  25 mg Oral QHS  . amLODipine  10 mg Oral Daily  . atorvastatin  40 mg Oral QHS  . budesonide (PULMICORT) nebulizer solution  0.5 mg Nebulization BID  . carvedilol  3.125 mg Oral Daily  . Chlorhexidine Gluconate Cloth  6 each Topical Q0600  . feeding supplement (NEPRO CARB STEADY)  237 mL Oral BID BM  . gabapentin  100 mg Oral BID  . Gerhardt's butt cream   Topical BID  .  heparin  4,000 Units Intravenous Once  . insulin aspart  0-15 Units Subcutaneous TID WC  . insulin aspart  0-5 Units Subcutaneous QHS  . insulin aspart  5 Units Subcutaneous TID WC  . insulin glargine  20 Units Subcutaneous BID  . isosorbide mononitrate  30 mg Oral Daily  . lactulose  20 g Oral BID  . multivitamin  1 tablet Oral QHS  . pantoprazole  40 mg Oral QHS  . senna  1 tablet Oral Daily  . sertraline  100 mg Oral QHS  . sodium chloride flush  3 mL Intravenous Q12H   Infusions:  . sodium chloride Stopped (10/22/18 0104)  . [START ON 10/25/2018] clindamycin (CLEOCIN) IV    . heparin      Assessment: Patient previously being anticoagulated with warfarin.  Patient now being transitioned to heparin infusion due to planned procedures of Permcath placement and peritoneal dialysis access.  Due  to this vascular surgery is planning to give patient Vitamin K 5 mg PO once for warfarin reversal with INR 2.14.    Per discussion with Marcelle Overlie, PA-C will start heparin drip without bolus.  Typical transition would be initiated when INR is a lower limit of goal range without bolus and dosed per indication.  Heparin is currently running at 1200 units/hr and level has not been checked prior to now (first level check was delayed)  2/11 1026 HL = 0.55 Therapeutic x 1 2/11 2254 HL = 0.27 (but drip had been paused) - restarted at same rate 2/12 0726 HL = 1.06  At this point Heparin drip was stopped and Warfarin was resumed - however, patient is going to have another procedure tomorrow (2/14) and so warfarin is being held (INR this am 1.12) in lieu of this procedure and heparin drip is being restart with a bolus at the previously adjusted rate  Goal of Therapy:  Heparin level 0.3-0.7 units/ml Monitor platelets by anticoagulation protocol: Yes   Plan:  Will give 4000 unit bolus and start rate back at 1000 units/hr  Will recheck HL @ [facility], PharmD, BCPS Clinical Pharmacist 10/24/2018 2:48 PM

## 2018-10-24 NOTE — Progress Notes (Signed)
Post HD Assessment   10/24/18 1830  Neurological  Level of Consciousness Alert  Orientation Level Oriented X4  Respiratory  Respiratory Pattern Labored  Chest Assessment Chest expansion symmetrical  Bilateral Breath Sounds Rhonchi  R Upper  Breath Sounds Rhonchi  L Upper Breath Sounds Rhonchi  R Lower Breath Sounds Rhonchi  L Lower Breath Sounds Rhonchi  Cardiac  Pulse Irregular  Heart Sounds Distant  ECG Monitor Yes  Cardiac Rhythm Atrial fibrillation  Vascular  R Radial Pulse +2  L Radial Pulse +2  Edema Generalized  Generalized Edema +1  Integumentary  Integumentary (WDL) X  Skin Color Appropriate for ethnicity  Musculoskeletal  Musculoskeletal (WDL) X  Generalized Weakness Yes  GU Assessment  Genitourinary (WDL) X  Genitourinary Symptoms  (HD  )  Psychosocial  Psychosocial (WDL) WDL

## 2018-10-24 NOTE — Progress Notes (Signed)
Post HD Tx    10/24/18 1820  Hand-Off documentation  Report given to (Full Name) Salome Spotted RN  Report received from (Full Name) Trellis Paganini RN  Vital Signs  Pulse Rate (!) 49  Resp 15  Oxygen Therapy  SpO2 100 %  Post-Hemodialysis Assessment  Rinseback Volume (mL) 250 mL  KECN 35.9 V  Dialyzer Clearance Lightly streaked  Duration of HD Treatment -hour(s) 2.5 hour(s)  Hemodialysis Intake (mL) 500 mL  UF Total -Machine (mL) 1500 mL  Net UF (mL) 1000 mL  Tolerated HD Treatment Yes

## 2018-10-24 NOTE — Progress Notes (Signed)
[facility] Daily Progress Note   Subjective: 2 Days Post-Op: 1. Insertion of a 19 cm tip to cuff palindrome tunneled dialysis catheter. 2. Catheter placement and cannulation under ultrasound and fluoroscopic guidance  Patient without complaint this AM. Permcath functioning appropriately.   Objective: Vitals:   10/23/18 1927 10/23/18 1951 10/24/18 0430 10/24/18 0801  BP: (!) 163/75  (!) 158/61 (!) 163/69  Pulse: 64  60 (!) 55  Resp: 16  18   Temp: 97.8 F (36.6 C)  97.7 F (36.5 C) 97.6 F (36.4 C)  TempSrc: Oral  Oral Oral  SpO2: 98% 95% 97% 98%  Weight:   109 kg   Height:        Intake/Output Summary (Last 24 hours) at 10/24/2018 1059 Last data filed at 10/24/2018 0431 Gross per 24 hour  Intake 236 ml  Output 1000 ml  Net -764 ml   Physical Exam: A&Ox3, NAD Chest: Right Permcath intact, clean and dry CV: Irregularly Irregular Pulmonary: CTA Bilaterally Abdomen: Soft, Nontender, Nondistended, (+) Bowel Sounds Vascular: Warm, Non-tender, Mild edema   Laboratory: CBC    Component Value Date/Time   WBC 34.6 (H) 10/24/2018 0613   HGB 10.7 (L) 10/24/2018 0613   HGB 12.7 10/02/2016 1442   HCT 34.7 (L) 10/24/2018 0613   HCT 40.5 10/02/2016 1442   PLT 140 (L) 10/24/2018 0613   PLT 193 10/02/2016 1442   BMET    Component Value Date/Time   NA 134 (L) 10/23/2018 0726   NA 142 10/07/2018 1425   K 4.4 10/23/2018 0726   CL 102 10/23/2018 0726   CO2 20 (L) 10/23/2018 0726   GLUCOSE 185 (H) 10/23/2018 0726   BUN 117 (H) 10/23/2018 0726   BUN 39 (H) 10/07/2018 1425   CREATININE 3.87 (H) 10/23/2018 0726   CALCIUM 8.3 (L) 10/23/2018 0726   GFRNONAA 11 (L) 10/23/2018 0726   GFRAA 13 (L) 10/23/2018 0726   Assessment/Planning: The patient is a 73 year old female with multiple medical issues included ESRD, POD #2 Permcath insertion 1) Permcath working well. 2) Plan was for peritoneal dialysis catheter insertion tomorrow with Dr. Delana Meyer.  3)  Patient now being seen by ID. Will discuss with [place] if appropriate to move forward with PD catheter insertion at this time.   Will discuss with Dr. Eber Hong Shreyas Piatkowski PA-C 10/24/2018 10:59 AM

## 2018-10-24 NOTE — Clinical Social Work Note (Addendum)
CSW has started insurance authorization for patient to go to SNF.  CSW faxed clinical information to patient's insurance company.  CSW also attempted to contact patient's daughter with bed offers, had to leave a message on voice mail.  CSW to continue to follow patient's progress throughout discharge planning.  Jones Broom. [place], MSW, [place]  10/24/2018 6:09 PM

## 2018-10-25 ENCOUNTER — Encounter
Admission: EM | Disposition: A | Discharge status: Home / Self Care | Payer: Self-pay | Source: Home / Self Care | Attending: Internal Medicine

## 2018-10-25 ENCOUNTER — Inpatient Hospital Stay: Payer: Medicare HMO | Admitting: Anesthesiology

## 2018-10-25 DIAGNOSIS — Z992 Dependence on renal dialysis: Secondary | ICD-10-CM

## 2018-10-25 DIAGNOSIS — I120 Hypertensive chronic kidney disease with stage 5 chronic kidney disease or end stage renal disease: Secondary | ICD-10-CM

## 2018-10-25 DIAGNOSIS — N186 End stage renal disease: Secondary | ICD-10-CM

## 2018-10-25 HISTORY — PX: CAPD INSERTION: SHX5233

## 2018-10-25 LAB — BASIC METABOLIC PANEL
Anion gap: 5 (ref 5–15)
BUN: 44 mg/dL — ABNORMAL HIGH (ref 8–23)
CO2: 30 mmol/L (ref 22–32)
Calcium: 8.1 mg/dL — ABNORMAL LOW (ref 8.9–10.3)
Chloride: 104 mmol/L (ref 98–111)
Creatinine, Ser: 2.06 mg/dL — ABNORMAL HIGH (ref 0.44–1.00)
GFR calc Af Amer: 28 mL/min — ABNORMAL LOW (ref >60)
GFR calc non Af Amer: 24 mL/min — ABNORMAL LOW (ref >60)
Glucose, Bld: 57 mg/dL — ABNORMAL LOW (ref 70–99)
Potassium: 3.5 mmol/L (ref 3.5–5.1)
Sodium: 139 mmol/L (ref 135–145)

## 2018-10-25 LAB — QUANTIFERON-TB GOLD PLUS (RQFGPL)
QuantiFERON Mitogen Value: >10 IU/mL
QuantiFERON Nil Value: 0.02 IU/mL
QuantiFERON TB1 Ag Value: 0.02 IU/mL
QuantiFERON TB2 Ag Value: 0.03 IU/mL

## 2018-10-25 LAB — CBC
HCT: 34.5 % — ABNORMAL LOW (ref 36.0–46.0)
Hemoglobin: 10.6 g/dL — ABNORMAL LOW (ref 12.0–15.0)
MCH: 25.4 pg — ABNORMAL LOW (ref 26.0–34.0)
MCHC: 30.7 g/dL (ref 30.0–36.0)
MCV: 82.5 fL (ref 80.0–100.0)
Platelets: 146 10*3/uL — ABNORMAL LOW (ref 150–400)
RBC: 4.18 MIL/uL (ref 3.87–5.11)
RDW: 18.3 % — ABNORMAL HIGH (ref 11.5–15.5)
WBC: 41.7 10*3/uL — ABNORMAL HIGH (ref 4.0–10.5)
nRBC: 0 % (ref 0.0–0.2)

## 2018-10-25 LAB — MAGNESIUM: Magnesium: 2.3 mg/dL (ref 1.7–2.4)

## 2018-10-25 LAB — GLUCOSE, CAPILLARY
GLUCOSE-CAPILLARY: 93 mg/dL (ref 70–99)
Glucose-Capillary: 45 mg/dL — ABNORMAL LOW (ref 70–99)
Glucose-Capillary: 127 mg/dL — ABNORMAL HIGH (ref 70–99)
Glucose-Capillary: 71 mg/dL (ref 70–99)
Glucose-Capillary: 78 mg/dL (ref 70–99)
Glucose-Capillary: 103 mg/dL — ABNORMAL HIGH (ref 70–99)
Glucose-Capillary: 142 mg/dL — ABNORMAL HIGH (ref 70–99)

## 2018-10-25 LAB — PROTIME-INR
INR: 1.14
PROTHROMBIN TIME: 14.5 s (ref 11.4–15.2)

## 2018-10-25 LAB — TYPE AND SCREEN
ABO/RH(D): O POS
Antibody Screen: NEGATIVE

## 2018-10-25 LAB — HEPARIN LEVEL (UNFRACTIONATED)
Heparin Unfractionated: 0.4 IU/mL (ref 0.30–0.70)
Heparin Unfractionated: 1.28 IU/mL — ABNORMAL HIGH (ref 0.30–0.70)

## 2018-10-25 LAB — QUANTIFERON-TB GOLD PLUS: QuantiFERON-TB Gold Plus: NEGATIVE

## 2018-10-25 LAB — APTT: aPTT: 72 seconds — ABNORMAL HIGH (ref 24–36)

## 2018-10-25 SURGERY — LAPAROSCOPIC INSERTION CONTINUOUS AMBULATORY PERITONEAL DIALYSIS  (CAPD) CATHETER
Anesthesia: General

## 2018-10-25 MED ORDER — GLYCOPYRROLATE 0.2 MG/ML IJ SOLN
INTRAMUSCULAR | Status: AC
  Filled 2018-10-25: qty 2

## 2018-10-25 MED ORDER — INSULIN GLARGINE 100 UNIT/ML ~~LOC~~ SOLN
15.0000 [IU] | Freq: Two times a day (BID) | SUBCUTANEOUS | Status: DC
  Administered 2018-10-25 – 2018-10-30 (×9): 15 [IU] via SUBCUTANEOUS
  Filled 2018-10-25 (×15): qty 0.15

## 2018-10-25 MED ORDER — FENTANYL CITRATE (PF) 100 MCG/2ML IJ SOLN
INTRAMUSCULAR | Status: AC
  Filled 2018-10-25: qty 2

## 2018-10-25 MED ORDER — FENTANYL CITRATE (PF) 100 MCG/2ML IJ SOLN: 25.0000 ug | INTRAMUSCULAR | Status: DC | PRN

## 2018-10-25 MED ORDER — SUGAMMADEX SODIUM 200 MG/2ML IV SOLN
INTRAVENOUS | Status: AC
  Filled 2018-10-25: qty 2

## 2018-10-25 MED ORDER — HEPARIN (PORCINE) 25000 UT/250ML-% IV SOLN
900.0000 [IU]/h | INTRAVENOUS | Status: DC
  Administered 2018-10-25 – 2018-10-26 (×2): 900 [IU]/h via INTRAVENOUS
  Filled 2018-10-25 (×3): qty 250

## 2018-10-25 MED ORDER — EPINEPHRINE PF 1 MG/ML IJ SOLN
INTRAMUSCULAR | Status: AC
  Filled 2018-10-25: qty 1

## 2018-10-25 MED ORDER — ONDANSETRON HCL 4 MG/2ML IJ SOLN: 4.0000 mg | Freq: Once | INTRAMUSCULAR | Status: DC | PRN

## 2018-10-25 MED ORDER — ALBUTEROL SULFATE HFA 108 (90 BASE) MCG/ACT IN AERS
INHALATION_SPRAY | RESPIRATORY_TRACT | Status: AC
  Filled 2018-10-25: qty 6.7

## 2018-10-25 MED ORDER — DEXTROSE 50 % IV SOLN
50.0000 mL | Freq: Once | INTRAVENOUS | Status: AC
  Administered 2018-10-25: 50 mL via INTRAVENOUS
  Filled 2018-10-25: qty 50

## 2018-10-25 MED ORDER — HEPARIN SOD (PORK) LOCK FLUSH 100 UNIT/ML IV SOLN
INTRAVENOUS | Status: AC
  Administered 2018-10-25: 500 [IU]
  Filled 2018-10-25: qty 5

## 2018-10-25 MED ORDER — CLINDAMYCIN PHOSPHATE 300 MG/50ML IV SOLN
300.0000 mg | Freq: Once | INTRAVENOUS | Status: AC
  Administered 2018-10-25: 300 mg via INTRAVENOUS
  Filled 2018-10-25: qty 50

## 2018-10-25 MED ORDER — ROCURONIUM BROMIDE 50 MG/5ML IV SOLN
INTRAVENOUS | Status: AC
  Filled 2018-10-25: qty 1

## 2018-10-25 MED ORDER — ALBUTEROL SULFATE HFA 108 (90 BASE) MCG/ACT IN AERS
INHALATION_SPRAY | RESPIRATORY_TRACT | Status: DC | PRN
  Administered 2018-10-25: 6 via RESPIRATORY_TRACT

## 2018-10-25 MED ORDER — FENTANYL CITRATE (PF) 100 MCG/2ML IJ SOLN
INTRAMUSCULAR | Status: DC | PRN
  Administered 2018-10-25: 50 ug via INTRAVENOUS

## 2018-10-25 MED ORDER — MIDAZOLAM HCL 2 MG/2ML IJ SOLN
INTRAMUSCULAR | Status: AC
  Filled 2018-10-25: qty 2

## 2018-10-25 MED ORDER — EPHEDRINE SULFATE 50 MG/ML IJ SOLN
INTRAMUSCULAR | Status: DC | PRN
  Administered 2018-10-25: 5 mg via INTRAVENOUS
  Administered 2018-10-25: 10 mg via INTRAVENOUS
  Administered 2018-10-25: 5 mg via INTRAVENOUS
  Administered 2018-10-25: 10 mg via INTRAVENOUS
  Administered 2018-10-25 (×2): 5 mg via INTRAVENOUS
  Administered 2018-10-25: 10 mg via INTRAVENOUS

## 2018-10-25 MED ORDER — MIDAZOLAM HCL 2 MG/2ML IJ SOLN
INTRAMUSCULAR | Status: DC | PRN
  Administered 2018-10-25: 1 mg via INTRAVENOUS

## 2018-10-25 MED ORDER — BUPIVACAINE HCL (PF) 0.5 % IJ SOLN
INTRAMUSCULAR | Status: AC
  Filled 2018-10-25: qty 30

## 2018-10-25 MED ORDER — ONDANSETRON HCL 4 MG/2ML IJ SOLN
INTRAMUSCULAR | Status: AC
  Filled 2018-10-25: qty 2

## 2018-10-25 MED ORDER — PROPOFOL 10 MG/ML IV BOLUS
INTRAVENOUS | Status: DC | PRN
  Administered 2018-10-25: 150 mg via INTRAVENOUS

## 2018-10-25 MED ORDER — LIDOCAINE HCL (CARDIAC) PF 100 MG/5ML IV SOSY
PREFILLED_SYRINGE | INTRAVENOUS | Status: DC | PRN
  Administered 2018-10-25: 100 mg via INTRAVENOUS

## 2018-10-25 MED ORDER — EPHEDRINE SULFATE 50 MG/ML IJ SOLN
INTRAMUSCULAR | Status: AC
  Filled 2018-10-25: qty 1

## 2018-10-25 MED ORDER — DEXTROSE-NACL 5-0.45 % IV SOLN
INTRAVENOUS | Status: DC
  Administered 2018-10-25 (×2): via INTRAVENOUS
  Administered 2018-10-25: 50 mL/h via INTRAVENOUS

## 2018-10-25 MED ORDER — BUPIVACAINE-EPINEPHRINE (PF) 0.5% -1:200000 IJ SOLN
INTRAMUSCULAR | Status: DC | PRN
  Administered 2018-10-25: 30 mL

## 2018-10-25 MED ORDER — LIDOCAINE HCL (PF) 2 % IJ SOLN
INTRAMUSCULAR | Status: AC
  Filled 2018-10-25: qty 10

## 2018-10-25 MED ORDER — PROPOFOL 10 MG/ML IV BOLUS
INTRAVENOUS | Status: AC
  Filled 2018-10-25: qty 20

## 2018-10-25 MED ORDER — ROCURONIUM BROMIDE 100 MG/10ML IV SOLN
INTRAVENOUS | Status: DC | PRN
  Administered 2018-10-25: 40 mg via INTRAVENOUS
  Administered 2018-10-25: 10 mg via INTRAVENOUS

## 2018-10-25 MED ORDER — SUGAMMADEX SODIUM 200 MG/2ML IV SOLN
INTRAVENOUS | Status: DC | PRN
  Administered 2018-10-25: 300 mg via INTRAVENOUS

## 2018-10-25 MED ORDER — GLYCOPYRROLATE 0.2 MG/ML IJ SOLN
INTRAMUSCULAR | Status: DC | PRN
  Administered 2018-10-25: 0.2 mg via INTRAVENOUS
  Administered 2018-10-25: 0.1 mg via INTRAVENOUS

## 2018-10-25 SURGICAL SUPPLY — 42 items
"PENCIL ELECTRO HAND CTR " (MISCELLANEOUS) ×1 IMPLANT
ADAPTER CATH DIALYSIS 18.75 (CATHETERS) IMPLANT
ADAPTER CATH DIALYSIS 18.75CM (CATHETERS)
BLADE SURG 15 STRL LF DISP TIS (BLADE) ×1 IMPLANT
BLADE SURG 15 STRL SS (BLADE) ×2
CANISTER SUCT 1200ML W/VALVE (MISCELLANEOUS) ×3 IMPLANT
CATH DLYS SWAN NECK 62.5CM (CATHETERS) IMPLANT
CHLORAPREP W/TINT 26ML (MISCELLANEOUS) ×3 IMPLANT
COVER WAND RF STERILE (DRAPES) ×1 IMPLANT
DERMABOND ADVANCED (GAUZE/BANDAGES/DRESSINGS) ×2
DERMABOND ADVANCED .7 DNX12 (GAUZE/BANDAGES/DRESSINGS) ×1 IMPLANT
ELECT REM PT RETURN 9FT ADLT (ELECTROSURGICAL) ×3
ELECTRODE REM PT RTRN 9FT ADLT (ELECTROSURGICAL) ×1 IMPLANT
GAUZE SPONGE 4X4 12PLY STRL (GAUZE/BANDAGES/DRESSINGS) ×1 IMPLANT
GLOVE BIO SURGEON STRL SZ7 (GLOVE) ×7 IMPLANT
GLOVE INDICATOR 7.5 STRL GRN (GLOVE) ×7 IMPLANT
GLOVE SURG SYN 8.0 (GLOVE) ×3 IMPLANT
GLOVE SURG SYN 8.0 PF PI (GLOVE) ×1 IMPLANT
GOWN STRL REUS W/ TWL LRG LVL3 (GOWN DISPOSABLE) ×1 IMPLANT
GOWN STRL REUS W/ TWL XL LVL3 (GOWN DISPOSABLE) ×2 IMPLANT
GOWN STRL REUS W/TWL LRG LVL3 (GOWN DISPOSABLE) ×2
GOWN STRL REUS W/TWL XL LVL3 (GOWN DISPOSABLE) ×4
HANDLE YANKAUER SUCT BULB TIP (MISCELLANEOUS) ×2 IMPLANT
KIT TURNOVER KIT A (KITS) ×3 IMPLANT
LABEL OR SOLS (LABEL) ×3 IMPLANT
MINICAP W/POVIDONE IODINE SOL (MISCELLANEOUS) ×3 IMPLANT
NDL HYPO 25X1 1.5 SAFETY (NEEDLE) ×1 IMPLANT
NEEDLE HYPO 25X1 1.5 SAFETY (NEEDLE) ×3 IMPLANT
NEEDLE INSUFFLATION 150MM (ENDOMECHANICALS) ×3 IMPLANT
NS IRRIG 500ML POUR BTL (IV SOLUTION) ×3 IMPLANT
PACK LAP CHOLECYSTECTOMY (MISCELLANEOUS) ×3 IMPLANT
PENCIL ELECTRO HAND CTR (MISCELLANEOUS) ×3 IMPLANT
SET TRANSFER 6 W/TWIST CLAMP 5 (SET/KITS/TRAYS/PACK) ×3 IMPLANT
SET TUBE SMOKE EVAC HIGH FLOW (TUBING) ×3 IMPLANT
SLEEVE ENDOPATH XCEL 5M (ENDOMECHANICALS) ×2 IMPLANT
SUT MNCRL AB 4-0 PS2 18 (SUTURE) ×3 IMPLANT
SUT VIC AB 3-0 SH 27 (SUTURE) ×4
SUT VIC AB 3-0 SH 27X BRD (SUTURE) ×2 IMPLANT
SUT VICRYL 0 AB UR-6 (SUTURE) ×8 IMPLANT
SYR 50ML LL SCALE MARK (SYRINGE) ×3 IMPLANT
TROCAR XCEL BLUNT TIP 100MML (ENDOMECHANICALS) ×2 IMPLANT
TROCAR XCEL NON-BLD 5MMX100MML (ENDOMECHANICALS) ×3 IMPLANT

## 2018-10-25 NOTE — Progress Notes (Signed)
Pre HD assessment    10/25/18 1626  Vital Signs  Temp 97.7 F (36.5 C)  Temp Source Oral  Pulse Rate (!) 55  Pulse Rate Source Monitor  Resp 19  BP 136/60  BP Location Right Arm  BP Method Automatic  Patient Position (if appropriate) Lying  Oxygen Therapy  SpO2 99 %  O2 Device Nasal Cannula  O2 Flow Rate (L/min) 2 L/min  Pain Assessment  Pain Scale 0-10  Pain Score 0  Dialysis Weight  Weight 108.1 kg  Type of Weight Pre-Dialysis  Time-Out for Hemodialysis  What Procedure? HD  Pt Identifiers(min of two) First/Last Name;MRN/Account#  Correct Site? Yes  Correct Side? Yes  Correct Procedure? Yes  Consents Verified? Yes  Rad Studies Available? N/A  Safety Precautions Reviewed? Yes  Research scientist (physical sciences)  (2A)  Station Number 4  UF/Alarm Test Passed  Conductivity: Meter 13.8  Conductivity: Machine  13.6  pH 7.2  Reverse Osmosis main  Normal Saline Lot Number [id number]  Dialyzer Lot Number [id number]  Disposable Set Lot Number [id number]  Machine Temperature 98.6 F (37 C)  Musician and Audible Yes  Blood Lines Intact and Secured Yes  Pre Treatment Patient Checks  Vascular access used during treatment Catheter  Hepatitis B Surface Antigen Results Negative  Date Hepatitis B Surface Antigen Drawn 10/22/18  Hepatitis B Surface Antibody  (<10)  Date Hepatitis B Surface Antibody Drawn 10/22/18  Hemodialysis Consent Verified Yes  Hemodialysis Standing Orders Initiated Yes  ECG (Telemetry) Monitor On Yes  Prime Ordered Normal Saline  Length of  DialysisTreatment -hour(s) 3 Hour(s)  Dialyzer Elisio 17H NR  Dialysate 3K, 2.5 Ca  Dialysis Anticoagulant None  Dialysate Flow Ordered 600  Blood Flow Rate Ordered 300 mL/min  Ultrafiltration Goal 2 Liters  Pre Treatment Labs Other (Comment) (heparin level)  Dialysis Blood Pressure Support Ordered Normal Saline  Education / Care Plan  Dialysis Education Provided Yes  Documented Education in Care Plan Yes

## 2018-10-25 NOTE — Clinical Social Work Note (Signed)
CSW spoke with patient and her daughter, they have decided to take patient home with home health.  CSW explained the difference between going home with home health or going to SNF for short term rehab.  Patient and daughter are aware of the advantages and disadvantages of going to SNF verse going home with home health.  Patient and daughter are still agreeing to go home instead of SNF.  Case discussed with case manager and plan is to discharge home with home health.  CSW to sign off please re-consult if social work needs arise.  Jones Broom. [place], MSW, [place]

## 2018-10-25 NOTE — Progress Notes (Signed)
HD tx start    10/25/18 1633  Vital Signs  Pulse Rate (!) 55  Pulse Rate Source Monitor  Resp 19  BP 139/63  BP Location Right Arm  BP Method Automatic  Patient Position (if appropriate) Lying  Oxygen Therapy  SpO2 97 %  O2 Device Nasal Cannula  O2 Flow Rate (L/min) 2 L/min  During Hemodialysis Assessment  Blood Flow Rate (mL/min) 300 mL/min  Arterial Pressure (mmHg) -70 mmHg  Venous Pressure (mmHg) 60 mmHg  Transmembrane Pressure (mmHg) 80 mmHg  Ultrafiltration Rate (mL/min) 830 mL/min  Dialysate Flow Rate (mL/min) 600 ml/min  Conductivity: Machine  14.1  HD Safety Checks Performed Yes  Dialysis Fluid Bolus Normal Saline  Bolus Amount (mL) 250 mL  Intra-Hemodialysis Comments Tx initiated

## 2018-10-25 NOTE — Progress Notes (Signed)
Post HD assessment    10/25/18 1945  Neurological  Level of Consciousness Alert  Orientation Level Oriented X4  Respiratory  Respiratory Pattern Regular;Unlabored  Chest Assessment Chest expansion symmetrical  Cardiac  Pulse Irregular  ECG Monitor Yes  Cardiac Rhythm Atrial fibrillation;SB  Vascular  R Radial Pulse +2  L Radial Pulse +2  Integumentary  Integumentary (WDL) X  Skin Color Appropriate for ethnicity  Musculoskeletal  Musculoskeletal (WDL) X  Generalized Weakness Yes  Assistive Device None  GU Assessment  Genitourinary (WDL) X  Genitourinary Symptoms  (HD)  Psychosocial  Psychosocial (WDL) WDL

## 2018-10-25 NOTE — Progress Notes (Signed)
Patient came to Pre-op with telemetry monitor on. Will remove and place on end of bed.

## 2018-10-25 NOTE — Progress Notes (Signed)
Hypoglycemic Event  CBG: 45  Treatment: 50ML dextrose 50%   Symptoms:none  Follow-up CBG: [id number] CBG Result:127  Possible Reasons for Event:pt NPO for procedure Comments/MD notified:notified, no new orders    Caroline Hernandez

## 2018-10-25 NOTE — Consult Note (Signed)
ANTICOAGULATION CONSULT NOTE - Initial Consult  Pharmacy Consult for Heparin infusion Indication: atrial fibrillation  Allergies  Allergen Reactions  . Piperacillin Other (See Comments)    Renal failure  . 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 See ID note**PER PT-CAUSED RENAL FAILURE**  . Zosyn [Piperacillin Sod-Tazobactam So] Other (See Comments)    Renal failure    Patient Measurements: Height: 5\' 8"  (172.7 cm) Weight: 240 lb 4.8 oz (109 kg) IBW/kg (Calculated) : 63.9 Heparin Dosing Weight: 84.8 kg  Vital Signs: Temp: 98.3 F (36.8 C) (02/13 2218) Temp Source: Oral (02/13 2218) BP: 152/54 (02/13 2218) Pulse Rate: 64 (02/13 2218)  Labs: Recent Labs    10/22/18 0433  10/22/18 2254 10/23/18 0726 10/24/18 0613 10/24/18 2334  HGB 11.1*  --   --  11.1* 10.7*  --   HCT 35.3*  --   --  37.0 34.7*  --   PLT 130*  --   --  133* 140*  --   LABPROT 19.3*  --   --  15.7* 14.3  --   INR 1.65  --   --  1.26 1.12  --   HEPARINUNFRC  --    < > 0.27* 1.06*  --  0.79*  CREATININE 4.19*  --   --  3.87*  --   --    < > = values in this interval not displayed.    Estimated Creatinine Clearance: 17.5 mL/min (A) (by C-G formula based on SCr of 3.87 mg/dL (H)).   Medical History: Past Medical History:  Diagnosis Date  . Amputation of left lower extremity below knee ([place])   . Amputation of right lower extremity below knee ([place])   . CAD (coronary artery disease)    a. 2004 Cardiac Arrest/CABG x 3 (LIMA->LAD, VG->OM, VG->RCA);  b. 06/2015 lexiscan MV: no significant ischemia, EF 48%, low risk->Med Rx.  . Chronic combined systolic and diastolic CHF (congestive heart failure) ([place])    a. 12/2014 Echo: EF 45-50%;  b. 08/2015 Echo: EF 45-50%, ant, antsept HK, mildly dil LA, nl RV, mild-mod TR, sev PAH (65mmHg); b. 08/2017 TEE: EF 40-45%, large PFO w/ L->R shunting.  . CKD (chronic kidney disease), stage IV ([place])   . CLL (chronic lymphocytic  leukemia) ([place])   . Diabetes mellitus without complication ([place])   . Essential hypertension   . GERD (gastroesophageal reflux disease)   . Hiatal hernia   . History of cardiac arrest    a. 2004.  Marland Kitchen Hyperlipidemia   . Ischemic cardiomyopathy    a. s/p MDT ICD (originally had 5852 lead-->gen change and lead revision ~ 2012 @ [facility]); b. 12/2014 Echo: EF 45-50%;  c.08/2015 Echo: EF 45-50%; d. 08/2017 Echo: EF 40-45%.  Marland Kitchen PAD (peripheral artery disease) ([facility])    a. s/p bilat BKA  . Persistent atrial fibrillation    a. Dx 12/2014.  CHA2DS2VASc = 6--> warfarin;  b. 09/2015 s/p DCCV-->on amio; c. s/p DCCV 04/25/16; d. 07/2016 s/p RFCA/PVI in setting of recurrent afib despite amio; e. 05/2018 Recurrent afib.    Medications:  Scheduled:  . albuterol  2.5 mg Nebulization TID  . amiodarone  400 mg Oral BID  . amitriptyline  25 mg Oral QHS  . amLODipine  10 mg Oral Daily  . atorvastatin  40 mg Oral QHS  . budesonide (PULMICORT) nebulizer solution  0.5 mg Nebulization BID  . Chlorhexidine Gluconate Cloth  6 each Topical Q0600  . feeding supplement (  NEPRO CARB STEADY)  237 mL Oral BID BM  . gabapentin  100 mg Oral BID  . Gerhardt's butt cream   Topical BID  . insulin aspart  0-15 Units Subcutaneous TID WC  . insulin aspart  0-5 Units Subcutaneous QHS  . insulin aspart  5 Units Subcutaneous TID WC  . insulin glargine  20 Units Subcutaneous BID  . isosorbide mononitrate  30 mg Oral Daily  . lactulose  20 g Oral BID  . multivitamin  1 tablet Oral QHS  . pantoprazole  40 mg Oral QHS  . senna  1 tablet Oral Daily  . sertraline  100 mg Oral QHS  . sodium chloride flush  3 mL Intravenous Q12H   Infusions:  . sodium chloride Stopped (10/22/18 0104)  . clindamycin (CLEOCIN) IV    . heparin 1,000 Units/hr (10/24/18 1543)    Assessment: Patient previously being anticoagulated with warfarin.  Patient now being transitioned to heparin infusion due to planned procedures of Permcath placement and peritoneal  dialysis access.  Due to this vascular surgery is planning to give patient Vitamin K 5 mg PO once for warfarin reversal with INR 2.14.    Per discussion with Marcelle Overlie, PA-C will start heparin drip without bolus.  Typical transition would be initiated when INR is a lower limit of goal range without bolus and dosed per indication.  Heparin is currently running at 1200 units/hr and level has not been checked prior to now (first level check was delayed)  2/11 1026 HL = 0.55 Therapeutic x 1 2/11 2254 HL = 0.27 (but drip had been paused) - restarted at same rate 2/12 0726 HL = 1.06  At this point Heparin drip was stopped and Warfarin was resumed - however, patient is going to have another procedure tomorrow (2/14) and so warfarin is being held (INR this am 1.12) in lieu of this procedure and heparin drip is being restart with a bolus at the previously adjusted rate  Goal of Therapy:  Heparin level 0.3-0.7 units/ml Monitor platelets by anticoagulation protocol: Yes   Plan:  02/14 @ 0100 HL 0.79 supratherapeutic. Will decrease rate to 900 units/hr and will recheck HL @ 0900. H/h trending down slightly will continue to monitor.  Tobie Lords, PharmD, BCPS Clinical Pharmacist 10/25/2018 1:06 AM

## 2018-10-25 NOTE — Anesthesia Post-op Follow-up Note (Signed)
Anesthesia QCDR form completed.        

## 2018-10-25 NOTE — Transfer of Care (Signed)
Immediate Anesthesia Transfer of Care Note  Patient: Caroline Hernandez  Procedure(s) Performed: LAPAROSCOPIC INSERTION CONTINUOUS AMBULATORY PERITONEAL DIALYSIS  (CAPD) CATHETER (N/A )  Patient Location: PACU  Anesthesia Type:General  Level of Consciousness: drowsy  Airway & Oxygen Therapy: Patient Spontanous Breathing and Patient connected to face mask oxygen  Post-op Assessment: Report given to RN and Post -op Vital signs reviewed and stable  Post vital signs: stable  Last Vitals:  Vitals Value Taken Time  BP 140/63 10/25/2018  2:35 PM  Temp 36.7 C 10/25/2018  2:35 PM  Pulse 57 10/25/2018  2:47 PM  Resp 17 10/25/2018  2:47 PM  SpO2 98 % 10/25/2018  2:47 PM  Vitals shown include unvalidated device data.  Last Pain:  Vitals:   10/25/18 1435  TempSrc:   PainSc: Asleep      Patients Stated Pain Goal: 0 (23/70/23 0172)  Complications: No apparent anesthesia complications

## 2018-10-25 NOTE — Anesthesia Procedure Notes (Signed)
Procedure Name: Intubation Date/Time: 10/25/2018 1:06 PM Performed by: Lavone Orn, CRNA Pre-anesthesia Checklist: Patient identified, Emergency Drugs available, Suction available, Patient being monitored and Timeout performed Patient Re-evaluated:Patient Re-evaluated prior to induction Oxygen Delivery Method: Circle system utilized Preoxygenation: Pre-oxygenation with 100% oxygen Induction Type: IV induction Ventilation: Mask ventilation without difficulty Laryngoscope Size: Mac and 4 Grade View: Grade II Tube type: Oral Tube size: 7.0 mm Number of attempts: 1 Airway Equipment and Method: Stylet Placement Confirmation: ETT inserted through vocal cords under direct vision and breath sounds checked- equal and bilateral Secured at: 22 cm Tube secured with: Tape Dental Injury: Teeth and Oropharynx as per pre-operative assessment

## 2018-10-25 NOTE — Care Management Important Message (Signed)
Important Message  Patient Details  Name: Caroline Hernandez MRN: [id number] Date of Birth: March 04, 1946   Medicare Important Message Given:  Yes    [name], RN 10/25/2018, 2:51 PM

## 2018-10-25 NOTE — Progress Notes (Signed)
[facility]  ROUNDING NOTE   Subjective:   Daughter at bedside. Patient completed two dialysis treatments. Tolerated well. Third treatment for today.   PD catheter to be placed today.    Objective:  Vital signs in last 24 hours:  Temp:  [98.2 F (36.8 C)-98.5 F (36.9 C)] 98.5 F (36.9 C) (02/14 0756) Pulse Rate:  [43-64] 46 (02/14 1223) Resp:  [14-20] 17 (02/14 1223) BP: (140-156)/(50-69) 146/57 (02/14 1223) SpO2:  [93 %-100 %] 97 % (02/14 1224) FiO2 (%):  [95 %] 95 % (02/13 1401) Weight:  [109 kg] 109 kg (02/14 1223)  Weight change: -2.8 kg Filed Weights   10/24/18 1520 10/25/18 0431 10/25/18 1223  Weight: 109 kg 109 kg 109 kg    Intake/Output: I/O last 3 completed shifts: In: 341.2 [P.O.:236; I.V.:105.2] Out: 1900 [Urine:900; Other:1000]   Intake/Output this shift:  Total I/O In: 53.5 [I.V.:53.5] Out: -   Physical Exam: General: No acute distress  Head: Normocephalic, atraumatic. Moist oral mucosal membranes  Eyes: Anicteric  Neck: Supple, trachea midline  Lungs:  Scattered rhonchi, normal effort  Heart: S1S2 no rubs  Abdomen:  Soft, nontender, bowel sounds present  Extremities: B/L BKA  Neurologic: Awake, alert, following commands  Skin: No lesions  Access:  RIJ permcath 2/12 Dr. Delana Meyer    Basic Metabolic Panel: Recent Labs  Lab 10/20/18 0244 10/21/18 0426 10/22/18 0433 10/23/18 0726 10/25/18 0754  NA 132* 131* 132* 134* 139  K 4.1 4.0 4.0 4.4 3.5  CL 97* 97* 98 102 104  CO2 23 23 23  20* 30  GLUCOSE 305* 310* 209* 185* 57*  BUN 98* 111* 114* 117* 44*  CREATININE 3.98* 4.27* 4.19* 3.87* 2.06*  CALCIUM 8.4* 8.4* 8.2* 8.3* 8.1*  MG  --   --  2.7*  --  2.3  PHOS  --   --  5.5*  --   --     Liver Function Tests: Recent Labs  Lab 10/22/18 0433  ALBUMIN 3.6   No results for input(s): LIPASE, AMYLASE in the last 168 hours. No results for input(s): AMMONIA in the last 168 hours.  CBC: Recent Labs  Lab 10/21/18 1658  10/22/18 0433 10/23/18 0726 10/24/18 0613 10/25/18 0754  WBC 31.0* 36.1* 41.7* 34.6* 41.7*  NEUTROABS  --   --   --  7.2  --   HGB 11.4* 11.1* 11.1* 10.7* 10.6*  HCT 36.7 35.3* 37.0 34.7* 34.5*  MCV 81.0 80.6 85.1 82.0 82.5  PLT 123* 130* 133* 140* 146*    Cardiac Enzymes: No results for input(s): CKTOTAL, CKMB, CKMBINDEX, TROPONINI in the last 168 hours.  BNP: Invalid input(s): POCBNP  CBG: Recent Labs  Lab 10/24/18 2045 10/25/18 0757 10/25/18 0847 10/25/18 1134 10/25/18 1203  GLUCAP 126* 45* 127* 71 24    Microbiology: Results for orders placed or performed during the hospital encounter of 10/16/18  Respiratory Panel by PCR     Status: Abnormal   Collection Time: 10/16/18  4:05 AM  Result Value Ref Range Status   Adenovirus NOT DETECTED NOT DETECTED Final   Coronavirus 229E NOT DETECTED NOT DETECTED Final    Comment: (NOTE) The Coronavirus on the Respiratory Panel, DOES NOT test for the novel  Coronavirus (2019 nCoV)    Coronavirus HKU1 NOT DETECTED NOT DETECTED Final   Coronavirus NL63 NOT DETECTED NOT DETECTED Final   Coronavirus OC43 NOT DETECTED NOT DETECTED Final   Metapneumovirus DETECTED (A) NOT DETECTED Final   Rhinovirus / Enterovirus NOT DETECTED  NOT DETECTED Final   Influenza A NOT DETECTED NOT DETECTED Final   Influenza B NOT DETECTED NOT DETECTED Final   Parainfluenza Virus 1 NOT DETECTED NOT DETECTED Final   Parainfluenza Virus 2 NOT DETECTED NOT DETECTED Final   Parainfluenza Virus 3 NOT DETECTED NOT DETECTED Final   Parainfluenza Virus 4 NOT DETECTED NOT DETECTED Final   Respiratory Syncytial Virus NOT DETECTED NOT DETECTED Final   Bordetella pertussis NOT DETECTED NOT DETECTED Final   Chlamydophila pneumoniae NOT DETECTED NOT DETECTED Final   Mycoplasma pneumoniae NOT DETECTED NOT DETECTED Final    Comment: Performed at [facility], [place] [address] [place], [place] [address]  Urine culture     Status: Abnormal   Collection Time:  10/16/18  4:23 AM  Result Value Ref Range Status   Specimen Description URINE, CLEAN CATCH  Final   Special Requests   Final    NONE Performed at [facility], [address] [place], [place] [address]    Culture >=100,000 COLONIES/mL KLEBSIELLA PNEUMONIAE (A)  Final   Report Status 10/19/2018 FINAL  Final   Organism ID, Bacteria KLEBSIELLA PNEUMONIAE (A)  Final      Susceptibility   Klebsiella pneumoniae - MIC*    AMPICILLIN RESISTANT Resistant     CEFAZOLIN <=4 SENSITIVE Sensitive     CEFTRIAXONE <=1 SENSITIVE Sensitive     CIPROFLOXACIN <=0.25 SENSITIVE Sensitive     GENTAMICIN <=1 SENSITIVE Sensitive     IMIPENEM <=0.25 SENSITIVE Sensitive     NITROFURANTOIN <=16 SENSITIVE Sensitive     TRIMETH/SULFA <=20 SENSITIVE Sensitive     AMPICILLIN/SULBACTAM 4 SENSITIVE Sensitive     PIP/TAZO <=4 SENSITIVE Sensitive     Extended ESBL NEGATIVE Sensitive     * >=100,000 COLONIES/mL KLEBSIELLA PNEUMONIAE  MRSA PCR Screening     Status: Abnormal   Collection Time: 10/16/18  5:13 PM  Result Value Ref Range Status   MRSA by PCR POSITIVE (A) NEGATIVE Final    Comment:        The GeneXpert MRSA Assay (FDA approved for NASAL specimens only), is one component of a comprehensive MRSA colonization surveillance program. It is not intended to diagnose MRSA infection nor to guide or monitor treatment for MRSA infections. RESULT CALLED TO, READ BACK BY AND VERIFIED WITH: MAI QUIARORO 10/16/18 @ 1838  [place] Performed at [facility], [address] [place], [place] [address]   CULTURE, BLOOD (ROUTINE X 2) w Reflex to ID Panel     Status: None (Preliminary result)   Collection Time: 10/24/18 12:11 AM  Result Value Ref Range Status   Specimen Description BLOOD RIGHT ASSIST CONTROL  Final   Special Requests   Final    BOTTLES DRAWN AEROBIC AND ANAEROBIC Blood Culture adequate volume   Culture   Final    NO GROWTH 1 DAY Performed at [facility], [address] [place], [place] [address]    Report Status PENDING  Incomplete  CULTURE, BLOOD (ROUTINE X 2) w Reflex to ID Panel     Status: None (Preliminary result)   Collection Time: 10/24/18 12:17 AM  Result Value Ref Range Status   Specimen Description BLOOD RIGHT HAND  Final   Special Requests   Final    BOTTLES DRAWN AEROBIC AND ANAEROBIC Blood Culture adequate volume   Culture   Final    NO GROWTH 1 DAY Performed at [facility], [address], [place], [place] [address]  Report Status PENDING  Incomplete    Coagulation Studies: Recent Labs    10/23/18 0726 10/24/18 0613 10/25/18 0754  LABPROT 15.7* 14.3 14.5  INR 1.26 1.12 1.14    Urinalysis: No results for input(s): COLORURINE, LABSPEC, PHURINE, GLUCOSEU, HGBUR, BILIRUBINUR, KETONESUR, PROTEINUR, UROBILINOGEN, NITRITE, LEUKOCYTESUR in the last 72 hours.  Invalid input(s): APPERANCEUR    Imaging: Dg Chest 2 View  Result Date: 10/24/2018 CLINICAL DATA:  Cough.  Leukocytosis. EXAM: CHEST - 2 VIEW COMPARISON:  10/16/2018 FINDINGS: Previous median sternotomy. Pacemaker AICD appears unchanged. Newly seen right internal jugular central line tip at the SVC RA junction. Mild cardiomegaly as seen previously. The right lung remains clear. Mild patchy density at the left base could be mild left base atelectasis or mild left lower lobe pneumonia. No acute bone finding. IMPRESSION: Patchy density in the left lower lobe could be atelectasis or pneumonia. Electronically Signed   By: Nelson Chimes M.D.   On: 10/24/2018 10:54     Medications:   . [MAR Hold] sodium chloride Stopped (10/22/18 0104)  . [MAR Hold] clindamycin (CLEOCIN) IV    . dextrose 5 % and 0.45% NaCl 50 mL/hr at 10/25/18 1232  . heparin Stopped (10/25/18 1230)   . [MAR Hold] albuterol  2.5 mg Nebulization TID  . [MAR Hold] amiodarone  400 mg Oral BID  . [MAR Hold] amitriptyline  25 mg Oral QHS  . [MAR Hold] amLODipine  10 mg Oral Daily  . [MAR Hold]  atorvastatin  40 mg Oral QHS  . [MAR Hold] budesonide (PULMICORT) nebulizer solution  0.5 mg Nebulization BID  . [MAR Hold] Chlorhexidine Gluconate Cloth  6 each Topical Q0600  . [MAR Hold] feeding supplement (NEPRO CARB STEADY)  237 mL Oral BID BM  . [MAR Hold] gabapentin  100 mg Oral BID  . [MAR Hold] Gerhardt's butt cream   Topical BID  . [MAR Hold] insulin aspart  0-15 Units Subcutaneous TID WC  . [MAR Hold] insulin aspart  0-5 Units Subcutaneous QHS  . [MAR Hold] insulin aspart  5 Units Subcutaneous TID WC  . [MAR Hold] insulin glargine  15 Units Subcutaneous BID  . [MAR Hold] isosorbide mononitrate  30 mg Oral Daily  . [MAR Hold] lactulose  20 g Oral BID  . [MAR Hold] multivitamin  1 tablet Oral QHS  . [MAR Hold] pantoprazole  40 mg Oral QHS  . [MAR Hold] senna  1 tablet Oral Daily  . [MAR Hold] sertraline  100 mg Oral QHS  . [MAR Hold] sodium chloride flush  3 mL Intravenous Q12H   [MAR Hold] sodium chloride, [MAR Hold] acetaminophen **OR** [MAR Hold] acetaminophen, bupivacaine-epinephrine, [MAR Hold] guaiFENesin-dextromethorphan, [MAR Hold] hydrALAZINE, [MAR Hold]  HYDROmorphone (DILAUDID) injection, [MAR Hold] hydrOXYzine, [MAR Hold] ipratropium, [MAR Hold] nitroGLYCERIN, [MAR Hold] ondansetron **OR** [MAR Hold] ondansetron (ZOFRAN) IV, [MAR Hold] traMADol  Assessment/ Plan:  [name] is a 73 y.o. [place] female with atrial fibrillation, hypertension, diabetes mellitus type II insulin dependent, diabetic neuropathy, congestive heart failure, coronary artery disease status post CABG, GERD, CLL, bilateral below the knee amputations admitted with cough, shortness of breath, UTI and now with acute renal failure with superimposed chronic kidney disease stage IV.  1.  End Stage Renal Disease with hyponatremia and uremia.  First dialysis 2/12 through permcath Dialysis for later today. Third treatment. Seen and examined   Outpatient planning for [facility].  Patient wants to  eventually do peritoneal dialysis at home. PD catheter scheduled to be placed  later today, Friday, 2/14 by Dr. Delana Meyer  2.  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    Thrombocytopenia   - No indication to start EPO  3.  Urinary tract infection. Klebsiella - cephalexin completed - Appreciate ID input  4. Diabetes mellitus type II with chronic kidney disease:insulin dependent. history of poor control.   5. Hypertension:  - amlodipine.    LOS: 9 Clarabell Matsuoka 2/14/20201:43 PM

## 2018-10-25 NOTE — Progress Notes (Signed)
HD tx end    10/25/18 1939  Vital Signs  Pulse Rate (!) 46  Pulse Rate Source Monitor  Resp 18  BP (!) 148/53  BP Location Right Arm  BP Method Automatic  Patient Position (if appropriate) Lying  Oxygen Therapy  SpO2 100 %  O2 Device Nasal Cannula  O2 Flow Rate (L/min) 2 L/min  During Hemodialysis Assessment  Dialysis Fluid Bolus Normal Saline  Bolus Amount (mL) 250 mL  Intra-Hemodialysis Comments Tx completed

## 2018-10-25 NOTE — Anesthesia Preprocedure Evaluation (Addendum)
Anesthesia Evaluation  Patient identified by MRN, date of birth, ID band Patient awake    Reviewed: Allergy & Precautions, NPO status , Patient's Chart, lab work & pertinent test results  History of Anesthesia Complications Negative for: history of anesthetic complications  Airway Mallampati: III       Dental   Pulmonary neg sleep apnea, neg COPD,  "Common cold"          Cardiovascular hypertension, Pt. on medications + CAD, + Peripheral Vascular Disease and +CHF (LVEF40%)  (-) Valvular Problems/Murmurs     Neuro/Psych    GI/Hepatic Neg liver ROS, hiatal hernia, GERD  Medicated and Controlled,  Endo/Other  diabetes, Type 2, Oral Hypoglycemic Agents, Insulin Dependent  Renal/GU ESRF and DialysisRenal disease     Musculoskeletal   Abdominal   Peds  Hematology   Anesthesia Other Findings   Reproductive/Obstetrics                            Anesthesia Physical Anesthesia Plan  ASA: IV  Anesthesia Plan: General   Post-op Pain Management:    Induction: Intravenous  PONV Risk Score and Plan: 3 and Ondansetron, Dexamethasone and Midazolam  Airway Management Planned: Oral ETT  Additional Equipment:   Intra-op Plan:   Post-operative Plan: Possible Post-op intubation/ventilation  Informed Consent: I have reviewed the patients History and Physical, chart, labs and discussed the procedure including the risks, benefits and alternatives for the proposed anesthesia with the patient or authorized representative who has indicated his/her understanding and acceptance.       Plan Discussed with:   Anesthesia Plan Comments:         Anesthesia Quick Evaluation

## 2018-10-25 NOTE — Progress Notes (Signed)
Post HD assessment. Pt tolerated tx well without c/o or complication. Net UF 2004, goal met.    10/25/18 1947  Vital Signs  Temp 98.4 F (36.9 C)  Temp Source Oral  Pulse Rate (!) 54  Pulse Rate Source Monitor  Resp 17  BP (!) 142/58  BP Location Right Arm  BP Method Automatic  Patient Position (if appropriate) Lying  Oxygen Therapy  SpO2 100 %  O2 Device Nasal Cannula  O2 Flow Rate (L/min) 2 L/min  Dialysis Weight  Weight 105.3 kg  Type of Weight Post-Dialysis  Post-Hemodialysis Assessment  Rinseback Volume (mL) 250 mL  KECN 52.5 V  Dialyzer Clearance Lightly streaked  Duration of HD Treatment -hour(s) 3 hour(s)  Hemodialysis Intake (mL) 500 mL  UF Total -Machine (mL) 2504 mL  Net UF (mL) 2004 mL  Tolerated HD Treatment Yes  Education / Care Plan  Dialysis Education Provided Yes  Documented Education in Care Plan Yes

## 2018-10-25 NOTE — Care Management (Signed)
Patient is set up for urgent PD start at [facility] at [place] on Monday morning per Elvera Bicker with [employer].

## 2018-10-25 NOTE — Progress Notes (Signed)
[facility] [place] [place] NAME: Caroline Hernandez    MR#:  [id number]  DATE OF BIRTH:  [birth year]  SUBJECTIVE:   The patient has better shortness of breath and cough, on oxygen via nasal cannula at 2 L REVIEW OF SYSTEMS:    Review of Systems  Constitutional: Negative.   HENT: Negative.   Eyes: Negative.   Respiratory: Cough and shortness of breath. Cardiovascular: Negative.   Gastrointestinal: Negative.   Genitourinary: Negative.   Musculoskeletal: Negative.   Skin: Negative.   Neurological: Negative.   Endo/Heme/Allergies: Negative.   Psychiatric/Behavioral: Negative.   All other systems reviewed and are negative.  DRUG ALLERGIES:   Allergies  Allergen Reactions  . Piperacillin Other (See Comments)    Renal failure  . 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 See ID note**PER PT-CAUSED RENAL FAILURE**  . Zosyn [Piperacillin Sod-Tazobactam So] Other (See Comments)    Renal failure    VITALS:  Blood pressure (!) 137/52, pulse (!) 59, temperature 97.7 F (36.5 C), resp. rate 10, height 5\' 8"  (1.727 m), weight 109 kg, SpO2 98 %.  PHYSICAL EXAMINATION:   Physical Exam  GENERAL:  73 y.o.-year-old patient lying in the bed on oxygen via nasal cannula.  Obesity. Unable to speak in full sentences without shortness of breath EYES: Pupils equal, round, reactive to light and accommodation. No scleral icterus. Extraocular muscles intact.  HEENT: Head atraumatic, normocephalic. Oropharynx and nasopharynx clear.  NECK:  Supple, no jugular venous distention. No thyroid enlargement, no tenderness.  LUNGS: No rales, no wheezing or rhonchi.  No use of accessory muscles to breathe. CARDIOVASCULAR: S1, S2 normal. No murmurs, rubs, or gallops.  ABDOMEN: Soft, nontender, nondistended. Bowel sounds present. No organomegaly or mass.  EXTREMITIES: No cyanosis, clubbing or edema b/l.   Bilateral below-knee  amputations NEUROLOGIC: Cranial nerves II through XII are intact. No focal Motor or sensory deficits b/l.   PSYCHIATRIC: The patient is alert and oriented x 3.  SKIN: No obvious rash, lesion, or ulcer.   LABORATORY PANEL:   CBC Recent Labs  Lab 10/25/18 0754  WBC 41.7*  HGB 10.6*  HCT 34.5*  PLT 146*   ------------------------------------------------------------------------------------------------------------------ Chemistries  Recent Labs  Lab 10/25/18 0754  NA 139  K 3.5  CL 104  CO2 30  GLUCOSE 57*  BUN 44*  CREATININE 2.06*  CALCIUM 8.1*  MG 2.3   ------------------------------------------------------------------------------------------------------------------  Cardiac Enzymes No results for input(s): TROPONINI in the last 168 hours. ------------------------------------------------------------------------------------------------------------------  RADIOLOGY:  Dg Chest 2 View  Result Date: 10/24/2018 CLINICAL DATA:  Cough.  Leukocytosis. EXAM: CHEST - 2 VIEW COMPARISON:  10/16/2018 FINDINGS: Previous median sternotomy. Pacemaker AICD appears unchanged. Newly seen right internal jugular central line tip at the SVC RA junction. Mild cardiomegaly as seen previously. The right lung remains clear. Mild patchy density at the left base could be mild left base atelectasis or mild left lower lobe pneumonia. No acute bone finding. IMPRESSION: Patchy density in the left lower lobe could be atelectasis or pneumonia. Electronically Signed   By: Nelson Chimes M.D.   On: 10/24/2018 10:54     ASSESSMENT AND PLAN:   *Acute bronchitis improved Status post acute hypoxic respiratory failure - oral steroids to continue - Scheduled Nebulizers - Inhalers -Wean O2 as tolerated -Respiratory panel with Metapneumovirus. -Wean oxygen as tolerated, NEB prn.  *Acute kidney injury over CKD stage III worsened Continue hemodialysis for 3 days per Dr.  Kolluru. PD access catheter placement  today.  *Acute respiratory failure due to acute on chronic systolic congestive heart failure, 35-40%.   Try to wean off oxygen by nasal cannula, DuoNeb as needed, Robitussin as needed. Continue hemodialysis.  *UTI with leukocytosis.  Klebsiella in urine cultures.  Continue oral Keflex antibiotic  *Chronic atrial fibrillation.  Continue amiodarone and Coreg Hold Coumadin Coumadin for vascular procedure per vascular surgery PA.  Start heparin drip.  *Diabetes mellitus type 2.  On sliding scale insulin along with Lantus insulin Monitor blood sugars closely to avoid hypoglycemia  *Leukocytosis, history of CLL. No evidence of any sepsis. Possible due to reaction to steroid, not due to CLL per oncology consult.   Generalized weakness.  The patient wants to go home with home health.  Discussed with Dr. Juleen China. All the records are reviewed and case discussed with Care Management/Social Worker Management plans discussed with the patient, her daughter and they are in agreement.  CODE STATUS: Full code  DVT Prophylaxis: SCDs  TOTAL TIME TAKING CARE OF THIS PATIENT: 28 minutes.   POSSIBLE D/C IN 2 DAYS, DEPENDING ON CLINICAL CONDITION.  Demetrios Loll M.D on 10/25/2018 at 3:31 PM  Between 7am to 6pm - Pager - [phone]  After 6pm go to [email or web address] - password [email or web address] [facility]  Office  [phone]  CC: Primary care physician; System, Provider Not In  Note: This dictation was prepared with Dragon dictation along with smaller phrase technology. Any transcriptional errors that result from this process are unintentional.

## 2018-10-25 NOTE — Consult Note (Signed)
ANTICOAGULATION CONSULT NOTE - Initial Consult  Pharmacy Consult for Heparin infusion Indication: atrial fibrillation  Allergies  Allergen Reactions  . Piperacillin Other (See Comments)    Renal failure  . 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 See ID note**PER PT-CAUSED RENAL FAILURE**  . Zosyn [Piperacillin Sod-Tazobactam So] Other (See Comments)    Renal failure    Patient Measurements: Height: 5\' 8"  (172.7 cm) Weight: 240 lb 4.8 oz (109 kg) IBW/kg (Calculated) : 63.9 Heparin Dosing Weight: 84.8 kg  Vital Signs: Temp: 98.4 F (36.9 C) (02/14 1542) Temp Source: Oral (02/14 1542) BP: 136/60 (02/14 1626) Pulse Rate: 47 (02/14 1542)  Labs: Recent Labs    10/23/18 0726 10/24/18 0613 10/24/18 2334 10/25/18 0754  HGB 11.1* 10.7*  --  10.6*  HCT 37.0 34.7*  --  34.5*  PLT 133* 140*  --  146*  APTT  --   --   --  72*  LABPROT 15.7* 14.3  --  14.5  INR 1.26 1.12  --  1.14  HEPARINUNFRC 1.06*  --  0.79* 0.40  CREATININE 3.87*  --   --  2.06*    Estimated Creatinine Clearance: 32.9 mL/min (A) (by C-G formula based on SCr of 2.06 mg/dL (H)).   Medical History: Past Medical History:  Diagnosis Date  . Amputation of left lower extremity below knee ([place])   . Amputation of right lower extremity below knee ([place])   . CAD (coronary artery disease)    a. 2004 Cardiac Arrest/CABG x 3 (LIMA->LAD, VG->OM, VG->RCA);  b. 06/2015 lexiscan MV: no significant ischemia, EF 48%, low risk->Med Rx.  . Chronic combined systolic and diastolic CHF (congestive heart failure) ([place])    a. 12/2014 Echo: EF 45-50%;  b. 08/2015 Echo: EF 45-50%, ant, antsept HK, mildly dil LA, nl RV, mild-mod TR, sev PAH (76mmHg); b. 08/2017 TEE: EF 40-45%, large PFO w/ L->R shunting.  . CKD (chronic kidney disease), stage IV ([place])   . CLL (chronic lymphocytic leukemia) ([place])   . Diabetes mellitus without complication ([place])   . Essential hypertension   . GERD  (gastroesophageal reflux disease)   . Hiatal hernia   . History of cardiac arrest    a. 2004.  Marland Kitchen Hyperlipidemia   . Ischemic cardiomyopathy    a. s/p MDT ICD (originally had 5027 lead-->gen change and lead revision ~ 2012 @ [place]); b. 12/2014 Echo: EF 45-50%;  c.08/2015 Echo: EF 45-50%; d. 08/2017 Echo: EF 40-45%.  Marland Kitchen PAD (peripheral artery disease) ([facility])    a. s/p bilat BKA  . Persistent atrial fibrillation    a. Dx 12/2014.  CHA2DS2VASc = 6--> warfarin;  b. 09/2015 s/p DCCV-->on amio; c. s/p DCCV 04/25/16; d. 07/2016 s/p RFCA/PVI in setting of recurrent afib despite amio; e. 05/2018 Recurrent afib.    Medications:  Scheduled:  . albuterol  2.5 mg Nebulization TID  . amiodarone  400 mg Oral BID  . amitriptyline  25 mg Oral QHS  . amLODipine  10 mg Oral Daily  . atorvastatin  40 mg Oral QHS  . budesonide (PULMICORT) nebulizer solution  0.5 mg Nebulization BID  . Chlorhexidine Gluconate Cloth  6 each Topical Q0600  . feeding supplement (NEPRO CARB STEADY)  237 mL Oral BID BM  . gabapentin  100 mg Oral BID  . Gerhardt's butt cream   Topical BID  . insulin aspart  0-15 Units Subcutaneous TID WC  . insulin aspart  0-5 Units Subcutaneous QHS  .  insulin aspart  5 Units Subcutaneous TID WC  . insulin glargine  15 Units Subcutaneous BID  . isosorbide mononitrate  30 mg Oral Daily  . lactulose  20 g Oral BID  . multivitamin  1 tablet Oral QHS  . pantoprazole  40 mg Oral QHS  . senna  1 tablet Oral Daily  . sertraline  100 mg Oral QHS  . sodium chloride flush  3 mL Intravenous Q12H   Infusions:  . sodium chloride Stopped (10/22/18 0104)  . clindamycin (CLEOCIN) IV    . dextrose 5 % and 0.45% NaCl 50 mL/hr at 10/25/18 1232  . heparin      Assessment: Patient previously being anticoagulated with warfarin.  Patient now being transitioned to heparin infusion due to planned procedures of Permcath placement and peritoneal dialysis access.  Due to this vascular surgery is planning to give patient  Vitamin K 5 mg PO once for warfarin reversal with INR 2.14.    Per discussion with Marcelle Overlie, PA-C will start heparin drip without bolus.  Typical transition would be initiated when INR is a lower limit of goal range without bolus and dosed per indication.  Heparin is currently running at 1200 units/hr and level has not been checked prior to now (first level check was delayed)  2/11 1026 HL = 0.55 Therapeutic x 1 2/11 2254 HL = 0.27 (but drip had been paused) - restarted at same rate 2/12 0726 HL = 1.06  At this point Heparin drip was stopped and Warfarin was resumed - however, patient is going to have another procedure tomorrow (2/14) and so warfarin is being held (INR this am 1.12) in lieu of this procedure and heparin drip is being restart with a bolus at the previously adjusted rate  2/14 0100 HL 0.79 (mildly supratherapeutic - dose decreased to 900 units/hr) 2/14 0754 HL 0.40   Goal of Therapy:  Heparin level 0.3-0.7 units/ml Monitor platelets by anticoagulation protocol: Yes   Plan:  2/14 Patient went for vascular procedure. Heparin is to be resumed at previous rate of 900 units/hr without a bolus at 18:30. Will check HL in 8 hours and follow nomogram for adjustments. Per Vascular consult may resume oral anticoagulant on 2/15. Daily CBC while on Heparin drip.  Paulina Fusi, PharmD, BCPS 10/25/2018 4:42 PM

## 2018-10-25 NOTE — Clinical Social Work Note (Signed)
CSW spoke to patient and her daughter they do not want to go to SNF they would rather go home with home health.  CSW received referral for SNF.  Case discussed with case manager and plan is to discharge home with home health.  CSW to sign off please re-consult if social work needs arise.  Jones Broom. [place], MSW, [place]

## 2018-10-25 NOTE — H&P (Signed)
[facility] History & Physical Update  The patient was interviewed and re-examined.  The patient's previous History and Physical has been reviewed and is unchanged.  There is no change in the plan of care. We plan to proceed with the scheduled procedure.  Hortencia Pilar, MD  10/25/2018, 12:41 PM

## 2018-10-25 NOTE — Op Note (Signed)
  OPERATIVE NOTE   PROCEDURE: 1. Laparoscopic peritoneal dialysis catheter placement.  PRE-OPERATIVE DIAGNOSIS: 1. End-stage renal disease 2. Hypertension  POST-OPERATIVE DIAGNOSIS: Same  SURGEON:  Hortencia Pilar  ASSISTANT(S): None  ANESTHESIA: general  ESTIMATED BLOOD LOSS: Minimal   FINDING(S): 1. None  SPECIMEN(S): None  INDICATIONS:  Caroline Hernandez is a 73 y.o. female who presents with end-stage renal disease she is currently maintaining with a right IJ catheter. The patient has decided to do peritoneal dialysis for his long-term dialysis. Risks and benefits of placement were discussed and he is agreeable to proceed.  Differences between peritoneal dialysis and hemodialysis were discussed.    DESCRIPTION: After obtaining full informed written consent, the patient was brought back to the operating room and placed supine upon the operating table. The patient received IV antibiotics prior to induction. After obtaining adequate anesthesia, the abdomen was prepped and draped in the standard fashion.   A small vertical incision was made in the midline just above the umbilicus and the dissection was carried down through the soft tissue to expose the fascia. 2-0 Vicryl sutures were then used to secure the fascia just lateral to the midline on both sides and an 15 blade was used to incise the fascia. The peritoneal cavity was entered under direct visualization significant adhesions were noted. Once a clear path to the peritoneal cavity was identified the hasson trocar was placed under direct visualization and insufflated the abdomen with carbon dioxide.  Extensive adhesions are noted. There is inability to visualize the left lower quadrant and therefore since the right lower quadrant appears free of adhesions the catheter will be placed from her right-sided approach.  A oblique incision is then made in the left lower quadrant over the medial rectus sheath and the rectus muscle the  dissection is carried down through the soft tissues and the fascia is exposed. Small incision is made in the fascia with the Bovie and a pursestring suture of 0 Vicryl is placed.  Seldinger needle was then inserted under direct visualization and the wire introduced under the view of the camera trocar was then placed and the catheter was advanced into the abdominal cavity over the trocar. Under direct visualization the catheters curled portion was positioned deep into the pelvis just to the right of midline. It was irrigated with heparinized saline. The deep cuff was secured to the fascial pursestring suture. A small counterincision was made in the right upper quadrant of the abdomen and the catheter was brought out this site. The appropriate distal connectors were placed, and I then placed 300 cc of saline through the catheter into the pelvis. The abdomen was desufflated. Immediately, 250 cc of effluent returned through the catheter when the bag was placed to gravity. I took one more look with the camera to ensure that the catheter was in the pelvis and it was. The hasson trocar was then removed. I then closed the incisions with a 2-0 Vicryl in the right lower quadrant incision and subsequently 3-0 Vicryl and 4-0 Monocryl, the other incisions were closed with 3-0 Vicryl and 4-0 Monocryl and placed Dermabond as dressing. Dry dressing was placed around the catheter exit site. The patient was then awakened from anesthesia and taken to the recovery room in stable condition having tolerated the procedure well.  COMPLICATIONS: None  CONDITION: None  Hortencia Pilar  10/25/2018,2:17 PM

## 2018-10-25 NOTE — Progress Notes (Addendum)
Pt HR was at 53-54. And have a scheduled amiodarone 400 mg tablet. Talked to dr. Tilford Pillar and ordered to give it. Will continue to monitor.  Update 0336: CCMD called and reported that PT HR went down to 36. On assessment pt was sleeping and not on any distress. VSS stable except oxygen. Pt was being wened off oxygen and was place on 1 liter but oxygen was 89 %. Pt was place back up to 2.5 liters and now sating at 95 %. Notify Dr. Posey Pronto. No new order was place. Will continue to monitor.  Update 0705: Pt peritoneal dialysis was soaked. Talked to Dr. Lorenso Courier and states to use 4 x 4 dressing and paper tape. Will notify incoming shift.  Update 0756: Dressing was change using 4 x 4 and a paper tape. Site was clean and dry. Will continue to monitor.

## 2018-10-25 NOTE — Progress Notes (Signed)
PT Cancellation Note  Patient Details Name: Caroline Hernandez MRN: [id number] DOB: Jul 29, 1946   Cancelled Treatment:    Reason Eval/Treat Not Completed: Patient at procedure or test/unavailable.  Will continue to follow acutely.   Collie Siad PT, DPT 10/25/2018, 12:44 PM

## 2018-10-25 NOTE — Progress Notes (Signed)
Pre HD assessment    10/25/18 1627  Neurological  Level of Consciousness Alert  Orientation Level Oriented X4  Respiratory  Respiratory Pattern Regular;Unlabored  Chest Assessment Chest expansion symmetrical  Cardiac  Pulse Irregular  ECG Monitor Yes  Cardiac Rhythm Atrial fibrillation;SB  Vascular  R Radial Pulse +2  L Radial Pulse +2  Integumentary  Integumentary (WDL) X  Skin Color Appropriate for ethnicity  Musculoskeletal  Musculoskeletal (WDL) X  Generalized Weakness Yes  Assistive Device None  GU Assessment  Genitourinary (WDL) X  Genitourinary Symptoms  (HD)  Psychosocial  Psychosocial (WDL) WDL

## 2018-10-25 NOTE — Progress Notes (Signed)
Inpatient Diabetes Program Recommendations  AACE/ADA: New Consensus Statement on Inpatient Glycemic Control (2015)  Target Ranges:  Prepandial:   less than 140 mg/dL      Peak postprandial:   less than 180 mg/dL (1-2 hours)      Critically ill patients:  140 - 180 mg/dL   Results for NATHIFA, RITTHALER (MRN [id number]) as of 10/25/2018 09:21  Ref. Range 10/24/2018 08:03 10/24/2018 12:06 10/24/2018 20:45  Glucose-Capillary Latest Ref Range: 70 - 99 mg/dL 178 (H)  8 units NOVOLOG given at 10am 144 (H)  7 units NOVOLOG +   20 units LANTUS given at 10am  126 (H)      20 units LANTUS given at 9:30pm   Results for ZAARA, SPROWL (MRN [id number]) as of 10/25/2018 09:21  Ref. Range 10/25/2018 07:57  Glucose-Capillary Latest Ref Range: 70 - 99 mg/dL 45 (L)    Home DM Meds: Lantus 38 units QAM/ 14 units QPM                             Novolog 10 units with breakfast, Novolog 5 units with lunch, Novolog 10 units with supper   Current Orders: Lantus 20 units BID                            Novolog Moderate Correction Scale/ SSI (0-15 units) TID AC + HS      Novolog 5 units TID with meals     MD- Patient with Severe Hypoglycemia this AM (CBG 45 mg/dl).  Please consider reducing Lantus to 15 units BID (25% reduction)     --Will follow patient during hospitalization--  Wyn Quaker RN, MSN, CDE Diabetes Coordinator Inpatient Glycemic Control Team Team Pager: [phone] (8a-5p)

## 2018-10-25 NOTE — Anesthesia Postprocedure Evaluation (Signed)
Anesthesia Post Note  Patient: Caroline Hernandez  Procedure(s) Performed: LAPAROSCOPIC INSERTION CONTINUOUS AMBULATORY PERITONEAL DIALYSIS  (CAPD) CATHETER (N/A )  Patient location during evaluation: PACU Anesthesia Type: General Level of consciousness: awake and alert Pain management: pain level controlled Vital Signs Assessment: post-procedure vital signs reviewed and stable Respiratory status: spontaneous breathing and respiratory function stable Cardiovascular status: stable Anesthetic complications: no     Last Vitals:  Vitals:   10/25/18 1224 10/25/18 1435  BP:    Pulse:    Resp:    Temp:  (P) 36.7 C  SpO2: 97%     Last Pain:  Vitals:   10/25/18 1223  TempSrc:   PainSc: 0-No pain                 KEPHART,WILLIAM K

## 2018-10-25 NOTE — Care Management Note (Signed)
Case Management Note  Patient Details  Name: Caroline Hernandez MRN: [id number] Date of Birth: [birth year]  Subjective/Objective:    Patient is from home with daughter.  New HD patient; will be set up at [facility] in [place].  Unsure of schedule at this time.  She has a history of bilateral BKA with prosthesis.  Uses a walker, has a hospital bed and ramp.  No equipment needs.  PT recommended SNF; patient wants to discharge to home with home health.  Provided [place] list; does not have a preference.  Made referral to [facility] for RN, PT, SW and aide; Cory with [place] [facility] accepted.  Will notify him at discharge. Current with PCP; obtains medications without difficult.  No further needs identified at this time.                  Action/Plan:   Expected Discharge Date:                  Expected Discharge Plan:  [place]  In-House Referral:  Clinical Social Work  Discharge planning Services  CM Consult  Post Acute Care Choice:    Choice offered to:  Patient  DME Arranged:    DME Agency:     HH Arranged:  RN, PT, Nurse's Aide, Social Work [employer] Agency:  [place]  Status of Service:  Completed, signed off  If discussed at [employer], dates discussed:    Additional Comments:  [name], RN 10/25/2018, 12:16 PM

## 2018-10-25 NOTE — Consult Note (Signed)
ANTICOAGULATION CONSULT NOTE - Initial Consult  Pharmacy Consult for Heparin infusion Indication: atrial fibrillation  Allergies  Allergen Reactions  . Piperacillin Other (See Comments)    Renal failure  . 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 See ID note**PER PT-CAUSED RENAL FAILURE**  . Zosyn [Piperacillin Sod-Tazobactam So] Other (See Comments)    Renal failure    Patient Measurements: Height: 5\' 8"  (172.7 cm) Weight: 240 lb 4.8 oz (109 kg) IBW/kg (Calculated) : 63.9 Heparin Dosing Weight: 84.8 kg  Vital Signs: Temp: 98.5 F (36.9 C) (02/14 0756) Temp Source: Oral (02/14 0756) BP: 156/55 (02/14 1105) Pulse Rate: 50 (02/14 1105)  Labs: Recent Labs    10/23/18 0726 10/24/18 0613 10/24/18 2334 10/25/18 0754  HGB 11.1* 10.7*  --  10.6*  HCT 37.0 34.7*  --  34.5*  PLT 133* 140*  --  146*  APTT  --   --   --  72*  LABPROT 15.7* 14.3  --  14.5  INR 1.26 1.12  --  1.14  HEPARINUNFRC 1.06*  --  0.79* 0.40  CREATININE 3.87*  --   --  2.06*    Estimated Creatinine Clearance: 32.9 mL/min (A) (by C-G formula based on SCr of 2.06 mg/dL (H)).   Medical History: Past Medical History:  Diagnosis Date  . Amputation of left lower extremity below knee ([place])   . Amputation of right lower extremity below knee ([place])   . CAD (coronary artery disease)    a. 2004 Cardiac Arrest/CABG x 3 (LIMA->LAD, VG->OM, VG->RCA);  b. 06/2015 lexiscan MV: no significant ischemia, EF 48%, low risk->Med Rx.  . Chronic combined systolic and diastolic CHF (congestive heart failure) ([place])    a. 12/2014 Echo: EF 45-50%;  b. 08/2015 Echo: EF 45-50%, ant, antsept HK, mildly dil LA, nl RV, mild-mod TR, sev PAH (68mmHg); b. 08/2017 TEE: EF 40-45%, large PFO w/ L->R shunting.  . CKD (chronic kidney disease), stage IV ([place])   . CLL (chronic lymphocytic leukemia) ([place])   . Diabetes mellitus without complication ([place])   . Essential hypertension   . GERD  (gastroesophageal reflux disease)   . Hiatal hernia   . History of cardiac arrest    a. 2004.  Marland Kitchen Hyperlipidemia   . Ischemic cardiomyopathy    a. s/p MDT ICD (originally had 6834 lead-->gen change and lead revision ~ 2012 @ [place]); b. 12/2014 Echo: EF 45-50%;  c.08/2015 Echo: EF 45-50%; d. 08/2017 Echo: EF 40-45%.  Marland Kitchen PAD (peripheral artery disease) (HCC)    a. s/p bilat BKA  . Persistent atrial fibrillation    a. Dx 12/2014.  CHA2DS2VASc = 6--> warfarin;  b. 09/2015 s/p DCCV-->on amio; c. s/p DCCV 04/25/16; d. 07/2016 s/p RFCA/PVI in setting of recurrent afib despite amio; e. 05/2018 Recurrent afib.    Medications:  Scheduled:  . albuterol  2.5 mg Nebulization TID  . amiodarone  400 mg Oral BID  . amitriptyline  25 mg Oral QHS  . amLODipine  10 mg Oral Daily  . atorvastatin  40 mg Oral QHS  . budesonide (PULMICORT) nebulizer solution  0.5 mg Nebulization BID  . Chlorhexidine Gluconate Cloth  6 each Topical Q0600  . feeding supplement (NEPRO CARB STEADY)  237 mL Oral BID BM  . gabapentin  100 mg Oral BID  . Gerhardt's butt cream   Topical BID  . insulin aspart  0-15 Units Subcutaneous TID WC  . insulin aspart  0-5 Units Subcutaneous QHS  .  insulin aspart  5 Units Subcutaneous TID WC  . insulin glargine  15 Units Subcutaneous BID  . isosorbide mononitrate  30 mg Oral Daily  . lactulose  20 g Oral BID  . multivitamin  1 tablet Oral QHS  . pantoprazole  40 mg Oral QHS  . senna  1 tablet Oral Daily  . sertraline  100 mg Oral QHS  . sodium chloride flush  3 mL Intravenous Q12H   Infusions:  . sodium chloride Stopped (10/22/18 0104)  . clindamycin (CLEOCIN) IV    . clindamycin (CLEOCIN) IV    . heparin 900 Units/hr (10/25/18 0114)    Assessment: Patient previously being anticoagulated with warfarin.  Patient now being transitioned to heparin infusion due to planned procedures of Permcath placement and peritoneal dialysis access.  Due to this vascular surgery is planning to give patient  Vitamin K 5 mg PO once for warfarin reversal with INR 2.14.    Per discussion with Marcelle Overlie, PA-C will start heparin drip without bolus.  Typical transition would be initiated when INR is a lower limit of goal range without bolus and dosed per indication.  Heparin is currently running at 1200 units/hr and level has not been checked prior to now (first level check was delayed)  2/11 1026 HL = 0.55 Therapeutic x 1 2/11 2254 HL = 0.27 (but drip had been paused) - restarted at same rate 2/12 0726 HL = 1.06  At this point Heparin drip was stopped and Warfarin was resumed - however, patient is going to have another procedure tomorrow (2/14) and so warfarin is being held (INR this am 1.12) in lieu of this procedure and heparin drip is being restart with a bolus at the previously adjusted rate  2/14 0100 HL 0.79 (mildly supratherapeutic - dose decreased to 900 units/hr) 2/14 0754 HL 0.40   Goal of Therapy:  Heparin level 0.3-0.7 units/ml Monitor platelets by anticoagulation protocol: Yes   Plan:  02/14 @ 0754 HL 0.40 - Will continue rate of 900 units/hr and will recheck HL @ 1600.   H/h trending down slightly will continue to monitor.  Lu Duffel, PharmD, BCPS Clinical Pharmacist 10/25/2018 12:02 PM

## 2018-10-25 NOTE — Plan of Care (Signed)
  Problem: Education: Goal: Ability to demonstrate management of disease process will improve Outcome: Progressing Goal: Ability to verbalize understanding of medication therapies will improve Outcome: Progressing   Problem: Education: Goal: Knowledge of General Education information will improve Description Including pain rating scale, medication(s)/side effects and non-pharmacologic comfort measures Outcome: Progressing

## 2018-10-26 DIAGNOSIS — J209 Acute bronchitis, unspecified: Secondary | ICD-10-CM

## 2018-10-26 DIAGNOSIS — N185 Chronic kidney disease, stage 5: Secondary | ICD-10-CM

## 2018-10-26 DIAGNOSIS — I25118 Atherosclerotic heart disease of native coronary artery with other forms of angina pectoris: Secondary | ICD-10-CM

## 2018-10-26 DIAGNOSIS — N3000 Acute cystitis without hematuria: Secondary | ICD-10-CM

## 2018-10-26 DIAGNOSIS — N171 Acute kidney failure with acute cortical necrosis: Secondary | ICD-10-CM

## 2018-10-26 LAB — RENAL FUNCTION PANEL
Albumin: 3.2 g/dL — ABNORMAL LOW (ref 3.5–5.0)
Anion gap: 6 (ref 5–15)
BUN: 28 mg/dL — ABNORMAL HIGH (ref 8–23)
CO2: 31 mmol/L (ref 22–32)
Calcium: 7.7 mg/dL — ABNORMAL LOW (ref 8.9–10.3)
Chloride: 99 mmol/L (ref 98–111)
Creatinine, Ser: 2.09 mg/dL — ABNORMAL HIGH (ref 0.44–1.00)
GFR calc Af Amer: 27 mL/min — ABNORMAL LOW (ref >60)
GFR, EST NON AFRICAN AMERICAN: 23 mL/min — AB (ref >60)
Glucose, Bld: 143 mg/dL — ABNORMAL HIGH (ref 70–99)
PHOSPHORUS: 2.7 mg/dL (ref 2.5–4.6)
Potassium: 3.8 mmol/L (ref 3.5–5.1)
Sodium: 136 mmol/L (ref 135–145)

## 2018-10-26 LAB — CBC
HCT: 34.2 % — ABNORMAL LOW (ref 36.0–46.0)
Hemoglobin: 10.2 g/dL — ABNORMAL LOW (ref 12.0–15.0)
MCH: 24.9 pg — ABNORMAL LOW (ref 26.0–34.0)
MCHC: 29.8 g/dL — ABNORMAL LOW (ref 30.0–36.0)
MCV: 83.6 fL (ref 80.0–100.0)
NRBC: 0 % (ref 0.0–0.2)
Platelets: 129 10*3/uL — ABNORMAL LOW (ref 150–400)
RBC: 4.09 MIL/uL (ref 3.87–5.11)
RDW: 18.3 % — ABNORMAL HIGH (ref 11.5–15.5)
WBC: 44.5 10*3/uL — ABNORMAL HIGH (ref 4.0–10.5)

## 2018-10-26 LAB — GLUCOSE, CAPILLARY
Glucose-Capillary: 101 mg/dL — ABNORMAL HIGH (ref 70–99)
Glucose-Capillary: 88 mg/dL (ref 70–99)
Glucose-Capillary: 130 mg/dL — ABNORMAL HIGH (ref 70–99)
Glucose-Capillary: 66 mg/dL — ABNORMAL LOW (ref 70–99)
Glucose-Capillary: 68 mg/dL — ABNORMAL LOW (ref 70–99)
Glucose-Capillary: 117 mg/dL — ABNORMAL HIGH (ref 70–99)
Glucose-Capillary: 130 mg/dL — ABNORMAL HIGH (ref 70–99)

## 2018-10-26 LAB — PROTIME-INR
INR: 1.08
Prothrombin Time: 13.9 seconds (ref 11.4–15.2)

## 2018-10-26 LAB — HEPARIN LEVEL (UNFRACTIONATED)
Heparin Unfractionated: 0.34 IU/mL (ref 0.30–0.70)
Heparin Unfractionated: 0.42 IU/mL (ref 0.30–0.70)

## 2018-10-26 MED ORDER — FUROSEMIDE 10 MG/ML IJ SOLN
80.0000 mg | Freq: Once | INTRAMUSCULAR | Status: AC
  Administered 2018-10-26: 80 mg via INTRAVENOUS
  Filled 2018-10-26: qty 8

## 2018-10-26 MED ORDER — WARFARIN SODIUM 4 MG PO TABS
4.0000 mg | ORAL_TABLET | Freq: Once | ORAL | Status: AC
  Administered 2018-10-26: 4 mg via ORAL
  Filled 2018-10-26: qty 1

## 2018-10-26 MED ORDER — AMIODARONE HCL 200 MG PO TABS
400.0000 mg | ORAL_TABLET | Freq: Every day | ORAL | Status: DC
  Filled 2018-10-26: qty 2

## 2018-10-26 MED ORDER — WARFARIN - PHARMACIST DOSING INPATIENT: Freq: Every day | Status: DC

## 2018-10-26 NOTE — Progress Notes (Signed)
[facility] [place] NAME: Caroline Hernandez    MR#:  [id number]  DATE OF BIRTH:  06-Dec-1945  SUBJECTIVE:   The patient has mild shortness of breath and cough, still on oxygen via nasal cannula at 2 L REVIEW OF SYSTEMS:    Review of Systems  Constitutional: Negative.   HENT: Negative.   Eyes: Negative.   Respiratory: Cough and shortness of breath. Cardiovascular: Negative.   Gastrointestinal: Negative.   Genitourinary: Negative.   Musculoskeletal: Negative.   Skin: Negative.   Neurological: Negative.   Endo/Heme/Allergies: Negative.   Psychiatric/Behavioral: Negative.   All other systems reviewed and are negative.  DRUG ALLERGIES:   Allergies  Allergen Reactions  . Piperacillin Other (See Comments)    Renal failure  . 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 See ID note**PER PT-CAUSED RENAL FAILURE**  . Zosyn [Piperacillin Sod-Tazobactam So] Other (See Comments)    Renal failure    VITALS:  Blood pressure (!) 114/45, pulse (!) 58, temperature 98 F (36.7 C), temperature source Oral, resp. rate 20, height 5\' 8"  (1.727 m), weight 105.1 kg, SpO2 97 %.  PHYSICAL EXAMINATION:   Physical Exam  GENERAL:  73 y.o.-year-old patient lying in the bed on oxygen via nasal cannula.  Obesity. Unable to speak in full sentences without shortness of breath EYES: Pupils equal, round, reactive to light and accommodation. No scleral icterus. Extraocular muscles intact.  HEENT: Head atraumatic, normocephalic. Oropharynx and nasopharynx clear.  NECK:  Supple, no jugular venous distention. No thyroid enlargement, no tenderness.  LUNGS: Bilateral basilar rales, no wheezing or rhonchi.  No use of accessory muscles to breathe. CARDIOVASCULAR: S1, S2 normal. No murmurs, rubs, or gallops.  ABDOMEN: Soft, nontender, nondistended. Bowel sounds present. No organomegaly or mass.  EXTREMITIES: No cyanosis, clubbing or  edema b/l.   Bilateral below-knee amputations NEUROLOGIC: Cranial nerves II through XII are intact. No focal Motor or sensory deficits b/l.   PSYCHIATRIC: The patient is alert and oriented x 3.  SKIN: No obvious rash, lesion, or ulcer.   LABORATORY PANEL:   CBC Recent Labs  Lab 10/26/18 0332  WBC 44.5*  HGB 10.2*  HCT 34.2*  PLT 129*   ------------------------------------------------------------------------------------------------------------------ Chemistries  Recent Labs  Lab 10/25/18 0754 10/26/18 1129  NA 139 136  K 3.5 3.8  CL 104 99  CO2 30 31  GLUCOSE 57* 143*  BUN 44* 28*  CREATININE 2.06* 2.09*  CALCIUM 8.1* 7.7*  MG 2.3  --    ------------------------------------------------------------------------------------------------------------------  Cardiac Enzymes No results for input(s): TROPONINI in the last 168 hours. ------------------------------------------------------------------------------------------------------------------  RADIOLOGY:  No results found.   ASSESSMENT AND PLAN:   *Acute bronchitis improved Status post acute hypoxic respiratory failure - oral steroids to continue - Scheduled Nebulizers - Inhalers -Wean O2 as tolerated -Respiratory panel with Metapneumovirus. -Wean oxygen as tolerated, NEB prn.  *Acute kidney injury over CKD stage III worsened Continue hemodialysis today per Dr. Juleen China. PD access catheter placed.  *Acute respiratory failure due to acute on chronic systolic congestive heart failure, 35-40%.   Try to wean off oxygen by nasal cannula, DuoNeb as needed, Robitussin as needed. Continue hemodialysis.  *UTI with leukocytosis.  Klebsiella in urine cultures.  Continue oral Keflex antibiotic  *Chronic atrial fibrillation.  Continue amiodarone and Coreg Resume Coumadin.  *Diabetes mellitus type 2.  On sliding scale insulin along with Lantus insulin Monitor blood sugars closely to avoid hypoglycemia  *Leukocytosis,  history of CLL. No evidence of any sepsis. Possible due to reaction to steroid, not due to CLL per oncology consult.   Generalized weakness.  The patient wants to go home with home health.  Discussed with Dr. Juleen China. All the records are reviewed and case discussed with Care Management/Social Worker Management plans discussed with the patient, her daughter and they are in agreement.  CODE STATUS: Full code  DVT Prophylaxis: SCDs  TOTAL TIME TAKING CARE OF THIS PATIENT: 26 minutes.   POSSIBLE D/C IN 1-2 DAYS, DEPENDING ON CLINICAL CONDITION.  Demetrios Loll M.D on 10/26/2018 at 2:12 PM  Between 7am to 6pm - Pager - [phone]  After 6pm go to [email or web address] - password [email or web address] [name]  Office  [phone]  CC: Primary care physician; System, Provider Not In  Note: This dictation was prepared with Dragon dictation along with smaller phrase technology. Any transcriptional errors that result from this process are unintentional.

## 2018-10-26 NOTE — Plan of Care (Signed)
  Problem: Education: Goal: Ability to demonstrate management of disease process will improve Outcome: Progressing Goal: Ability to verbalize understanding of medication therapies will improve Outcome: Progressing   Problem: Clinical Measurements: Goal: Cardiovascular complication will be avoided Outcome: Progressing

## 2018-10-26 NOTE — Progress Notes (Signed)
Post HD assessment. Pt tolerated tx well without c/o or complication. Net UF 3505, goal met.    10/26/18 1727  Vital Signs  Temp 98.7 F (37.1 C)  Temp Source Oral  Pulse Rate (!) 54  Pulse Rate Source Monitor  Resp 15  BP (!) 152/62  BP Location Right Arm  BP Method Automatic  Patient Position (if appropriate) Lying  Oxygen Therapy  SpO2 100 %  O2 Device Nasal Cannula  O2 Flow Rate (L/min) 2 L/min  Dialysis Weight  Weight 103.8 kg  Type of Weight Post-Dialysis  Post-Hemodialysis Assessment  Rinseback Volume (mL) 250 mL  KECN 67.3 V  Dialyzer Clearance Lightly streaked  Duration of HD Treatment -hour(s) 3 hour(s)  Hemodialysis Intake (mL) 500 mL  UF Total -Machine (mL) 4005 mL  Net UF (mL) 3505 mL  Tolerated HD Treatment Yes  Education / Care Plan  Dialysis Education Provided Yes  Documented Education in Care Plan Yes

## 2018-10-26 NOTE — Consult Note (Addendum)
ANTICOAGULATION CONSULT NOTE - Initial Consult  Pharmacy Consult for Heparin infusion and Warfarin Indication: atrial fibrillation  Allergies  Allergen Reactions  . Piperacillin Other (See Comments)    Renal failure  . 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 See ID note**PER PT-CAUSED RENAL FAILURE**  . Zosyn [Piperacillin Sod-Tazobactam So] Other (See Comments)    Renal failure    Patient Measurements: Height: 5\' 8"  (172.7 cm) Weight: 231 lb 11.2 oz (105.1 kg) IBW/kg (Calculated) : 63.9 Heparin Dosing Weight: 84.8 kg  Vital Signs: Temp: 98 F (36.7 C) (02/15 0709) Temp Source: Oral (02/15 0709) BP: 114/45 (02/15 0709) Pulse Rate: 58 (02/15 0709)  Labs: Recent Labs    10/24/18 0613  10/25/18 0754 10/25/18 1632 10/26/18 0332  HGB 10.7*  --  10.6*  --  10.2*  HCT 34.7*  --  34.5*  --  34.2*  PLT 140*  --  146*  --  129*  APTT  --   --  72*  --   --   LABPROT 14.3  --  14.5  --   --   INR 1.12  --  1.14  --   --   HEPARINUNFRC  --    < > 0.40 1.28* 0.34  CREATININE  --   --  2.06*  --   --    < > = values in this interval not displayed.    Estimated Creatinine Clearance: 32.3 mL/min (A) (by C-G formula based on SCr of 2.06 mg/dL (H)).   Medical History: Past Medical History:  Diagnosis Date  . Amputation of left lower extremity below knee ([place])   . Amputation of right lower extremity below knee ([place])   . CAD (coronary artery disease)    a. 2004 Cardiac Arrest/CABG x 3 (LIMA->LAD, VG->OM, VG->RCA);  b. 06/2015 lexiscan MV: no significant ischemia, EF 48%, low risk->Med Rx.  . Chronic combined systolic and diastolic CHF (congestive heart failure) ([place])    a. 12/2014 Echo: EF 45-50%;  b. 08/2015 Echo: EF 45-50%, ant, antsept HK, mildly dil LA, nl RV, mild-mod TR, sev PAH (24mmHg); b. 08/2017 TEE: EF 40-45%, large PFO w/ L->R shunting.  . CKD (chronic kidney disease), stage IV ([place])   . CLL (chronic lymphocytic leukemia) ([place])    . Diabetes mellitus without complication ([place])   . Essential hypertension   . GERD (gastroesophageal reflux disease)   . Hiatal hernia   . History of cardiac arrest    a. 2004.  Marland Kitchen Hyperlipidemia   . Ischemic cardiomyopathy    a. s/p MDT ICD (originally had 3785 lead-->gen change and lead revision ~ 2012 @ [place]); b. 12/2014 Echo: EF 45-50%;  c.08/2015 Echo: EF 45-50%; d. 08/2017 Echo: EF 40-45%.  Marland Kitchen PAD (peripheral artery disease) ([facility])    a. s/p bilat BKA  . Persistent atrial fibrillation    a. Dx 12/2014.  CHA2DS2VASc = 6--> warfarin;  b. 09/2015 s/p DCCV-->on amio; c. s/p DCCV 04/25/16; d. 07/2016 s/p RFCA/PVI in setting of recurrent afib despite amio; e. 05/2018 Recurrent afib.    Medications:  Scheduled:  . albuterol  2.5 mg Nebulization TID  . amiodarone  400 mg Oral BID  . amitriptyline  25 mg Oral QHS  . amLODipine  10 mg Oral Daily  . atorvastatin  40 mg Oral QHS  . budesonide (PULMICORT) nebulizer solution  0.5 mg Nebulization BID  . Chlorhexidine Gluconate Cloth  6 each Topical Q0600  . feeding supplement (  NEPRO CARB STEADY)  237 mL Oral BID BM  . furosemide  80 mg Intravenous Once  . gabapentin  100 mg Oral BID  . Gerhardt's butt cream   Topical BID  . insulin aspart  0-15 Units Subcutaneous TID WC  . insulin aspart  0-5 Units Subcutaneous QHS  . insulin aspart  5 Units Subcutaneous TID WC  . insulin glargine  15 Units Subcutaneous BID  . isosorbide mononitrate  30 mg Oral Daily  . lactulose  20 g Oral BID  . multivitamin  1 tablet Oral QHS  . pantoprazole  40 mg Oral QHS  . senna  1 tablet Oral Daily  . sertraline  100 mg Oral QHS  . sodium chloride flush  3 mL Intravenous Q12H  . warfarin  4 mg Oral ONCE-1800  . Warfarin - Pharmacist Dosing Inpatient   Does not apply q1800   Infusions:  . sodium chloride Stopped (10/22/18 0104)  . clindamycin (CLEOCIN) IV    . heparin 900 Units/hr (10/25/18 2021)    Assessment: Patient previously being anticoagulated with  warfarin.  Patient now being transitioned to heparin infusion due to planned procedures of Permcath placement and peritoneal dialysis access.  Due to this vascular surgery is planning to give patient Vitamin K 5 mg PO once for warfarin reversal with INR 2.14.    Per discussion with Marcelle Overlie, PA-C will start heparin drip without bolus.  Typical transition would be initiated when INR is a lower limit of goal range without bolus and dosed per indication.  Heparin is currently running at 1200 units/hr and level has not been checked prior to now (first level check was delayed)  2/11 1026 HL = 0.55 Therapeutic x 1 2/11 2254 HL = 0.27 (but drip had been paused) - restarted at same rate 2/12 0726 HL = 1.06  At this point Heparin drip was stopped and Warfarin was resumed - however, patient is going to have another procedure tomorrow (2/14) and so warfarin is being held (INR this am 1.12) in lieu of this procedure and heparin drip is being restart with a bolus at the previously adjusted rate  2/14 0100 HL 0.79 (mildly supratherapeutic - dose decreased to 900 units/hr) 2/14 0754 HL 0.40   WARFARIN: 2/14 INR 1.14    None 2/15 INR 1.08   Goal of Therapy:  Heparin level 0.3-0.7 units/ml Monitor platelets by anticoagulation protocol: Yes   Plan:  Heparin :  02/15 @ 0330 HL 0.34 therapeutic. Will continue current rate and will recheck HL @ 1100. CBC stable.  Warfarin: Restarting Warfarin 10/26/2018. (last Warfarin dose given on 2/12) Will order Warfarin 4 mg x 1. Patient on Amiodarone. Will f/u INR daily. Currently beidging on Heparin drip.   Chinita Greenland PharmD Clinical Pharmacist 10/26/2018

## 2018-10-26 NOTE — Consult Note (Signed)
ANTICOAGULATION CONSULT NOTE  Pharmacy Consult for Heparin infusion and Warfarin Indication: atrial fibrillation  Patient Measurements: Height: 5\' 8"  (172.7 cm) Weight: 231 lb 11.2 oz (105.1 kg) IBW/kg (Calculated) : 63.9 Heparin Dosing Weight: 84.8 kg  Vital Signs: Temp: 98 F (36.7 C) (02/15 0709) Temp Source: Oral (02/15 0709) BP: 114/45 (02/15 0709) Pulse Rate: 58 (02/15 0709)  Labs: Recent Labs    10/24/18 6503  10/25/18 0754 10/25/18 1632 10/26/18 0332 10/26/18 1129  HGB 10.7*  --  10.6*  --  10.2*  --   HCT 34.7*  --  34.5*  --  34.2*  --   PLT 140*  --  146*  --  129*  --   APTT  --   --  72*  --   --   --   LABPROT 14.3  --  14.5  --   --  13.9  INR 1.12  --  1.14  --   --  1.08  HEPARINUNFRC  --    < > 0.40 1.28* 0.34 0.42  CREATININE  --   --  2.06*  --   --  2.09*   < > = values in this interval not displayed.    Estimated Creatinine Clearance: 31.8 mL/min (A) (by C-G formula based on SCr of 2.09 mg/dL (H)).   Medical History: Past Medical History:  Diagnosis Date  . Amputation of left lower extremity below knee ([place])   . Amputation of right lower extremity below knee ([place])   . CAD (coronary artery disease)    a. 2004 Cardiac Arrest/CABG x 3 (LIMA->LAD, VG->OM, VG->RCA);  b. 06/2015 lexiscan MV: no significant ischemia, EF 48%, low risk->Med Rx.  . Chronic combined systolic and diastolic CHF (congestive heart failure) ([place])    a. 12/2014 Echo: EF 45-50%;  b. 08/2015 Echo: EF 45-50%, ant, antsept HK, mildly dil LA, nl RV, mild-mod TR, sev PAH (77mmHg); b. 08/2017 TEE: EF 40-45%, large PFO w/ L->R shunting.  . CKD (chronic kidney disease), stage IV ([place])   . CLL (chronic lymphocytic leukemia) ([place])   . Diabetes mellitus without complication ([place])   . Essential hypertension   . GERD (gastroesophageal reflux disease)   . Hiatal hernia   . History of cardiac arrest    a. 2004.  Marland Kitchen Hyperlipidemia   . Ischemic cardiomyopathy    a. s/p MDT ICD (originally  had 5465 lead-->gen change and lead revision ~ 2012 @ [place]); b. 12/2014 Echo: EF 45-50%;  c.08/2015 Echo: EF 45-50%; d. 08/2017 Echo: EF 40-45%.  Marland Kitchen PAD (peripheral artery disease) ([facility])    a. s/p bilat BKA  . Persistent atrial fibrillation    a. Dx 12/2014.  CHA2DS2VASc = 6--> warfarin;  b. 09/2015 s/p DCCV-->on amio; c. s/p DCCV 04/25/16; d. 07/2016 s/p RFCA/PVI in setting of recurrent afib despite amio; e. 05/2018 Recurrent afib.    Medications:  Scheduled:  . albuterol  2.5 mg Nebulization TID  . amiodarone  400 mg Oral BID  . amitriptyline  25 mg Oral QHS  . amLODipine  10 mg Oral Daily  . atorvastatin  40 mg Oral QHS  . budesonide (PULMICORT) nebulizer solution  0.5 mg Nebulization BID  . Chlorhexidine Gluconate Cloth  6 each Topical Q0600  . feeding supplement (NEPRO CARB STEADY)  237 mL Oral BID BM  . gabapentin  100 mg Oral BID  . Gerhardt's butt cream   Topical BID  . insulin aspart  0-15 Units Subcutaneous TID WC  .  insulin aspart  0-5 Units Subcutaneous QHS  . insulin aspart  5 Units Subcutaneous TID WC  . insulin glargine  15 Units Subcutaneous BID  . isosorbide mononitrate  30 mg Oral Daily  . lactulose  20 g Oral BID  . multivitamin  1 tablet Oral QHS  . pantoprazole  40 mg Oral QHS  . senna  1 tablet Oral Daily  . sertraline  100 mg Oral QHS  . sodium chloride flush  3 mL Intravenous Q12H  . warfarin  4 mg Oral ONCE-1800  . Warfarin - Pharmacist Dosing Inpatient   Does not apply q1800   Infusions:  . sodium chloride Stopped (10/22/18 0104)  . clindamycin (CLEOCIN) IV    . heparin 900 Units/hr (10/25/18 2021)    Assessment: Patient previously being anticoagulated with warfarin.  Patient now being transitioned to heparin infusion due to planned procedures of Permcath placement and peritoneal dialysis access.  Due to this vascular surgery is planning to give patient Vitamin K 5 mg PO once for warfarin reversal with INR 2.14.    Per discussion with Marcelle Overlie,  PA-C will start heparin drip without bolus.  Typical transition would be initiated when INR is a lower limit of goal range without bolus and dosed per indication.  Heparin is currently running at 1200 units/hr and level has not been checked prior to now (first level check was delayed)  2/11 1026 HL = 0.55 Therapeutic x 1 2/11 2254 HL = 0.27 (but drip had been paused) - restarted at same rate 2/12 0726 HL = 1.06  At this point Heparin drip was stopped and Warfarin was resumed - however, patient is going to have another procedure tomorrow (2/14) and so warfarin is being held (INR this am 1.12) in lieu of this procedure and heparin drip is being restart with a bolus at the previously adjusted rate  2/14 0100 HL 0.79 (mildly supratherapeutic - dose decreased to 900 units/hr) 2/14 0754 HL 0.40 2/15 0330 HL 0.34 2/15 1129 HL 0.42   WARFARIN: 2/14 INR 1.14     2/15 INR    Goal of Therapy:  Heparin level 0.3-0.7 units/ml Monitor platelets by anticoagulation protocol: Yes   Plan:  Heparin :  This is the second consecutive therapeutic heparin level. Will continue current rate and will recheck HL tomorrow with morning labs,  Including CBC.  Warfarin: Restarting Warfarin 10/26/2018. Will order Warfarin 4 mg x 1. Patient on Amiodarone. Will f/u INR daily. Currently brdging on Heparin drip.   Vallery Sa, PharmD Clinical Pharmacist 10/26/2018

## 2018-10-26 NOTE — Consult Note (Signed)
ANTICOAGULATION CONSULT NOTE - Initial Consult  Pharmacy Consult for Heparin infusion Indication: atrial fibrillation  Allergies  Allergen Reactions  . Piperacillin Other (See Comments)    Renal failure  . 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 See ID note**PER PT-CAUSED RENAL FAILURE**  . Zosyn [Piperacillin Sod-Tazobactam So] Other (See Comments)    Renal failure    Patient Measurements: Height: 5\' 8"  (172.7 cm) Weight: 230 lb 3.2 oz (104.4 kg)(amanda tech) IBW/kg (Calculated) : 63.9 Heparin Dosing Weight: 84.8 kg  Vital Signs: Temp: 98.7 F (37.1 C) (02/15 0331) Temp Source: Oral (02/15 0331) BP: 117/50 (02/15 0331) Pulse Rate: 44 (02/15 0331)  Labs: Recent Labs    10/23/18 0726 10/24/18 4431  10/25/18 0754 10/25/18 1632 10/26/18 0332  HGB 11.1* 10.7*  --  10.6*  --  10.2*  HCT 37.0 34.7*  --  34.5*  --  34.2*  PLT 133* 140*  --  146*  --  129*  APTT  --   --   --  72*  --   --   LABPROT 15.7* 14.3  --  14.5  --   --   INR 1.26 1.12  --  1.14  --   --   HEPARINUNFRC 1.06*  --    < > 0.40 1.28* 0.34  CREATININE 3.87*  --   --  2.06*  --   --    < > = values in this interval not displayed.    Estimated Creatinine Clearance: 32.1 mL/min (A) (by C-G formula based on SCr of 2.06 mg/dL (H)).   Medical History: Past Medical History:  Diagnosis Date  . Amputation of left lower extremity below knee ([place])   . Amputation of right lower extremity below knee ([place])   . CAD (coronary artery disease)    a. 2004 Cardiac Arrest/CABG x 3 (LIMA->LAD, VG->OM, VG->RCA);  b. 06/2015 lexiscan MV: no significant ischemia, EF 48%, low risk->Med Rx.  . Chronic combined systolic and diastolic CHF (congestive heart failure) ([place])    a. 12/2014 Echo: EF 45-50%;  b. 08/2015 Echo: EF 45-50%, ant, antsept HK, mildly dil LA, nl RV, mild-mod TR, sev PAH (4mmHg); b. 08/2017 TEE: EF 40-45%, large PFO w/ L->R shunting.  . CKD (chronic kidney disease),  stage IV ([place])   . CLL (chronic lymphocytic leukemia) ([place])   . Diabetes mellitus without complication ([place])   . Essential hypertension   . GERD (gastroesophageal reflux disease)   . Hiatal hernia   . History of cardiac arrest    a. 2004.  Marland Kitchen Hyperlipidemia   . Ischemic cardiomyopathy    a. s/p MDT ICD (originally had 5400 lead-->gen change and lead revision ~ 2012 @ [facility]); b. 12/2014 Echo: EF 45-50%;  c.08/2015 Echo: EF 45-50%; d. 08/2017 Echo: EF 40-45%.  Marland Kitchen PAD (peripheral artery disease) ([facility])    a. s/p bilat BKA  . Persistent atrial fibrillation    a. Dx 12/2014.  CHA2DS2VASc = 6--> warfarin;  b. 09/2015 s/p DCCV-->on amio; c. s/p DCCV 04/25/16; d. 07/2016 s/p RFCA/PVI in setting of recurrent afib despite amio; e. 05/2018 Recurrent afib.    Medications:  Scheduled:  . albuterol  2.5 mg Nebulization TID  . amiodarone  400 mg Oral BID  . amitriptyline  25 mg Oral QHS  . amLODipine  10 mg Oral Daily  . atorvastatin  40 mg Oral QHS  . budesonide (PULMICORT) nebulizer solution  0.5 mg Nebulization BID  . Chlorhexidine  Gluconate Cloth  6 each Topical [id number]  . feeding supplement (NEPRO CARB STEADY)  237 mL Oral BID BM  . gabapentin  100 mg Oral BID  . Gerhardt's butt cream   Topical BID  . insulin aspart  0-15 Units Subcutaneous TID WC  . insulin aspart  0-5 Units Subcutaneous QHS  . insulin aspart  5 Units Subcutaneous TID WC  . insulin glargine  15 Units Subcutaneous BID  . isosorbide mononitrate  30 mg Oral Daily  . lactulose  20 g Oral BID  . multivitamin  1 tablet Oral QHS  . pantoprazole  40 mg Oral QHS  . senna  1 tablet Oral Daily  . sertraline  100 mg Oral QHS  . sodium chloride flush  3 mL Intravenous Q12H   Infusions:  . sodium chloride Stopped (10/22/18 0104)  . clindamycin (CLEOCIN) IV    . dextrose 5 % and 0.45% NaCl 50 mL/hr at 10/25/18 2025  . heparin 900 Units/hr (10/25/18 2021)    Assessment: Patient previously being anticoagulated with warfarin.  Patient now  being transitioned to heparin infusion due to planned procedures of Permcath placement and peritoneal dialysis access.  Due to this vascular surgery is planning to give patient Vitamin K 5 mg PO once for warfarin reversal with INR 2.14.    Per discussion with Marcelle Overlie, PA-C will start heparin drip without bolus.  Typical transition would be initiated when INR is a lower limit of goal range without bolus and dosed per indication.  Heparin is currently running at 1200 units/hr and level has not been checked prior to now (first level check was delayed)  2/11 1026 HL = 0.55 Therapeutic x 1 2/11 2254 HL = 0.27 (but drip had been paused) - restarted at same rate 2/12 0726 HL = 1.06  At this point Heparin drip was stopped and Warfarin was resumed - however, patient is going to have another procedure tomorrow (2/14) and so warfarin is being held (INR this am 1.12) in lieu of this procedure and heparin drip is being restart with a bolus at the previously adjusted rate  2/14 0100 HL 0.79 (mildly supratherapeutic - dose decreased to 900 units/hr) 2/14 0754 HL 0.40   Goal of Therapy:  Heparin level 0.3-0.7 units/ml Monitor platelets by anticoagulation protocol: Yes   Plan:  02/15 @ 0330 HL 0.34 therapeutic. Will continue current rate and will recheck HL @ 1100. CBC stable.  Tobie Lords, PharmD, BCPS Clinical Pharmacist 10/26/2018

## 2018-10-26 NOTE — Progress Notes (Signed)
Pre HD assessment    10/26/18 1407  Neurological  Level of Consciousness Alert  Orientation Level Oriented X4  Respiratory  Respiratory Pattern Regular;Unlabored  Chest Assessment Chest expansion symmetrical  Cardiac  Pulse Irregular  ECG Monitor Yes  Cardiac Rhythm Atrial fibrillation;SB  Vascular  R Radial Pulse +2  L Radial Pulse +2  Edema Generalized  Integumentary  Integumentary (WDL) X  Skin Color Appropriate for ethnicity  Musculoskeletal  Musculoskeletal (WDL) X  Generalized Weakness Yes  Assistive Device None  GU Assessment  Genitourinary (WDL) X  Genitourinary Symptoms  (HD)  Psychosocial  Psychosocial (WDL) WDL

## 2018-10-26 NOTE — Progress Notes (Signed)
[facility]  ROUNDING NOTE   Subjective:   Completed three hemodialysis treatments. PD catheter placed yesterday.  Bridging back to warfarin.   Patient reports shortness of breath. Requiring nasal canula oxygen.   Objective:  Vital signs in last 24 hours:  Temp:  [97.7 F (36.5 C)-98.7 F (37.1 C)] 98 F (36.7 C) (02/15 0709) Pulse Rate:  [35-67] 58 (02/15 0709) Resp:  [10-28] 20 (02/15 0709) BP: (114-148)/(45-78) 114/45 (02/15 0709) SpO2:  [89 %-100 %] 97 % (02/15 0709) FiO2 (%):  [96 %] 96 % (02/14 2057) Weight:  [104.4 kg-109 kg] 105.1 kg (02/15 0709)  Weight change: 0 kg Filed Weights   10/25/18 1947 10/25/18 2018 10/26/18 0709  Weight: 105.3 kg 104.4 kg 105.1 kg    Intake/Output: I/O last 3 completed shifts: In: 963.8 [I.V.:963.8] Out: 2209 [Urine:200; FOYDX:4128; Blood:5]   Intake/Output this shift:  Total I/O In: -  Out: 550 [Urine:550]  Physical Exam: General: No acute distress  Head: Normocephalic, atraumatic. Moist oral mucosal membranes  Eyes: Anicteric  Neck: Supple, trachea midline  Lungs:  Scattered rhonchi, normal effort  Heart: S1S2 no rubs  Abdomen:  Soft, nontender, bowel sounds present  Extremities: B/L BKA  Neurologic: Awake, alert, following commands  Skin: No lesions  Access:  RIJ permcath 2/12 Dr. Delana Meyer    Basic Metabolic Panel: Recent Labs  Lab 10/20/18 0244 10/21/18 0426 10/22/18 0433 10/23/18 0726 10/25/18 0754  NA 132* 131* 132* 134* 139  K 4.1 4.0 4.0 4.4 3.5  CL 97* 97* 98 102 104  CO2 23 23 23  20* 30  GLUCOSE 305* 310* 209* 185* 57*  BUN 98* 111* 114* 117* 44*  CREATININE 3.98* 4.27* 4.19* 3.87* 2.06*  CALCIUM 8.4* 8.4* 8.2* 8.3* 8.1*  MG  --   --  2.7*  --  2.3  PHOS  --   --  5.5*  --   --     Liver Function Tests: Recent Labs  Lab 10/22/18 0433  ALBUMIN 3.6   No results for input(s): LIPASE, AMYLASE in the last 168 hours. No results for input(s): AMMONIA in the last 168  hours.  CBC: Recent Labs  Lab 10/22/18 0433 10/23/18 0726 10/24/18 0613 10/25/18 0754 10/26/18 0332  WBC 36.1* 41.7* 34.6* 41.7* 44.5*  NEUTROABS  --   --  7.2  --   --   HGB 11.1* 11.1* 10.7* 10.6* 10.2*  HCT 35.3* 37.0 34.7* 34.5* 34.2*  MCV 80.6 85.1 82.0 82.5 83.6  PLT 130* 133* 140* 146* 129*    Cardiac Enzymes: No results for input(s): CKTOTAL, CKMB, CKMBINDEX, TROPONINI in the last 168 hours.  BNP: Invalid input(s): POCBNP  CBG: Recent Labs  Lab 10/25/18 1459 10/25/18 2021 10/25/18 2241 10/26/18 0655 10/26/18 0758  GLUCAP 103* 93 142* 101* 88    Microbiology: Results for orders placed or performed during the hospital encounter of 10/16/18  Respiratory Panel by PCR     Status: Abnormal   Collection Time: 10/16/18  4:05 AM  Result Value Ref Range Status   Adenovirus NOT DETECTED NOT DETECTED Final   Coronavirus 229E NOT DETECTED NOT DETECTED Final    Comment: (NOTE) The Coronavirus on the Respiratory Panel, DOES NOT test for the novel  Coronavirus (2019 nCoV)    Coronavirus HKU1 NOT DETECTED NOT DETECTED Final   Coronavirus NL63 NOT DETECTED NOT DETECTED Final   Coronavirus OC43 NOT DETECTED NOT DETECTED Final   Metapneumovirus DETECTED (A) NOT DETECTED Final   Rhinovirus / Enterovirus  NOT DETECTED NOT DETECTED Final   Influenza A NOT DETECTED NOT DETECTED Final   Influenza B NOT DETECTED NOT DETECTED Final   Parainfluenza Virus 1 NOT DETECTED NOT DETECTED Final   Parainfluenza Virus 2 NOT DETECTED NOT DETECTED Final   Parainfluenza Virus 3 NOT DETECTED NOT DETECTED Final   Parainfluenza Virus 4 NOT DETECTED NOT DETECTED Final   Respiratory Syncytial Virus NOT DETECTED NOT DETECTED Final   Bordetella pertussis NOT DETECTED NOT DETECTED Final   Chlamydophila pneumoniae NOT DETECTED NOT DETECTED Final   Mycoplasma pneumoniae NOT DETECTED NOT DETECTED Final    Comment: Performed at [facility], [place] [address] [place], [place] [address]  Urine  culture     Status: Abnormal   Collection Time: 10/16/18  4:23 AM  Result Value Ref Range Status   Specimen Description URINE, CLEAN CATCH  Final   Special Requests   Final    NONE Performed at [facility], [address] [place], [place] [address]    Culture >=100,000 COLONIES/mL KLEBSIELLA PNEUMONIAE (A)  Final   Report Status 10/19/2018 FINAL  Final   Organism ID, Bacteria KLEBSIELLA PNEUMONIAE (A)  Final      Susceptibility   Klebsiella pneumoniae - MIC*    AMPICILLIN RESISTANT Resistant     CEFAZOLIN <=4 SENSITIVE Sensitive     CEFTRIAXONE <=1 SENSITIVE Sensitive     CIPROFLOXACIN <=0.25 SENSITIVE Sensitive     GENTAMICIN <=1 SENSITIVE Sensitive     IMIPENEM <=0.25 SENSITIVE Sensitive     NITROFURANTOIN <=16 SENSITIVE Sensitive     TRIMETH/SULFA <=20 SENSITIVE Sensitive     AMPICILLIN/SULBACTAM 4 SENSITIVE Sensitive     PIP/TAZO <=4 SENSITIVE Sensitive     Extended ESBL NEGATIVE Sensitive     * >=100,000 COLONIES/mL KLEBSIELLA PNEUMONIAE  MRSA PCR Screening     Status: Abnormal   Collection Time: 10/16/18  5:13 PM  Result Value Ref Range Status   MRSA by PCR POSITIVE (A) NEGATIVE Final    Comment:        The GeneXpert MRSA Assay (FDA approved for NASAL specimens only), is one component of a comprehensive MRSA colonization surveillance program. It is not intended to diagnose MRSA infection nor to guide or monitor treatment for MRSA infections. RESULT CALLED TO, READ BACK BY AND VERIFIED WITH: MAI QUIARORO 10/16/18 @ 1838  [place] Performed at [facility], [address] [place], [place] [address]   CULTURE, BLOOD (ROUTINE X 2) w Reflex to ID Panel     Status: None (Preliminary result)   Collection Time: 10/24/18 12:11 AM  Result Value Ref Range Status   Specimen Description BLOOD RIGHT ASSIST CONTROL  Final   Special Requests   Final    BOTTLES DRAWN AEROBIC AND ANAEROBIC Blood Culture adequate volume   Culture   Final    NO GROWTH 2  DAYS Performed at [facility], [address] [place], [place] [address]    Report Status PENDING  Incomplete  CULTURE, BLOOD (ROUTINE X 2) w Reflex to ID Panel     Status: None (Preliminary result)   Collection Time: 10/24/18 12:17 AM  Result Value Ref Range Status   Specimen Description BLOOD RIGHT HAND  Final   Special Requests   Final    BOTTLES DRAWN AEROBIC AND ANAEROBIC Blood Culture adequate volume   Culture   Final    NO GROWTH 2 DAYS Performed at [facility], [address], [place], [place] [address]  Report Status PENDING  Incomplete    Coagulation Studies: Recent Labs    10/24/18 0613 10/25/18 0754  LABPROT 14.3 14.5  INR 1.12 1.14    Urinalysis: No results for input(s): COLORURINE, LABSPEC, PHURINE, GLUCOSEU, HGBUR, BILIRUBINUR, KETONESUR, PROTEINUR, UROBILINOGEN, NITRITE, LEUKOCYTESUR in the last 72 hours.  Invalid input(s): APPERANCEUR    Imaging: No results found.   Medications:   . sodium chloride Stopped (10/22/18 0104)  . clindamycin (CLEOCIN) IV    . heparin 900 Units/hr (10/25/18 2021)   . albuterol  2.5 mg Nebulization TID  . amiodarone  400 mg Oral BID  . amitriptyline  25 mg Oral QHS  . amLODipine  10 mg Oral Daily  . atorvastatin  40 mg Oral QHS  . budesonide (PULMICORT) nebulizer solution  0.5 mg Nebulization BID  . Chlorhexidine Gluconate Cloth  6 each Topical Q0600  . feeding supplement (NEPRO CARB STEADY)  237 mL Oral BID BM  . furosemide  80 mg Intravenous Once  . gabapentin  100 mg Oral BID  . Gerhardt's butt cream   Topical BID  . insulin aspart  0-15 Units Subcutaneous TID WC  . insulin aspart  0-5 Units Subcutaneous QHS  . insulin aspart  5 Units Subcutaneous TID WC  . insulin glargine  15 Units Subcutaneous BID  . isosorbide mononitrate  30 mg Oral Daily  . lactulose  20 g Oral BID  . multivitamin  1 tablet Oral QHS  . pantoprazole  40 mg Oral QHS  . senna  1 tablet Oral Daily  . sertraline  100  mg Oral QHS  . sodium chloride flush  3 mL Intravenous Q12H  . warfarin  4 mg Oral ONCE-1800  . Warfarin - Pharmacist Dosing Inpatient   Does not apply q1800   sodium chloride, acetaminophen **OR** acetaminophen, guaiFENesin-dextromethorphan, hydrALAZINE, hydrOXYzine, ipratropium, nitroGLYCERIN, ondansetron **OR** ondansetron (ZOFRAN) IV, traMADol  Assessment/ Plan:  [name] is a 73 y.o. [place] female with atrial fibrillation, hypertension, diabetes mellitus type II insulin dependent, diabetic neuropathy, congestive heart failure, coronary artery disease status post CABG, GERD, CLL, bilateral below the knee amputations admitted with cough, shortness of breath, UTI and initiated on hemodialysis this admission.   1.  End Stage Renal Disease with hyponatremia and uremia.  First dialysis 2/12 through permcath. Completed three dialysis treatments.  PD catheter placed by Dr. Delana Meyer of 2/14.  Patient continues to have shortness of breath - Will schedule hemodialysis treatment today with UF goal of 3.5 liters. Orders prepared.  Outpatient planning for Peritoneal dialysis to be started on discharge at [place] to stay in place for back up hemodialysis as necessary.   2.  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    Thrombocytopenia   Hemoglobin 10.2 - No indication to start EPO  3.  Urinary tract infection. Klebsiella - cephalexin completed - Appreciate ID input  4. Diabetes mellitus type II with chronic kidney disease:insulin dependent. history of poor control.   5. Hypertension:  - amlodipine.   6. Atrial fibrillation: on amiodarone - anticoagulation with heparin gtt. Bridging to warfarin. Will see if patient can be on a factor Xa inhibitor.    LOS: [place] 2/15/202011:33 AM

## 2018-10-26 NOTE — Progress Notes (Signed)
HD tx end    10/26/18 1721  Vital Signs  Pulse Rate (!) 54  Pulse Rate Source Monitor  Resp 18  BP (!) 144/113  BP Location Right Arm  BP Method Automatic  Patient Position (if appropriate) Lying  Oxygen Therapy  SpO2 100 %  O2 Device Nasal Cannula  O2 Flow Rate (L/min) 2 L/min  During Hemodialysis Assessment  Dialysis Fluid Bolus Normal Saline  Bolus Amount (mL) 250 mL  Intra-Hemodialysis Comments Tx completed

## 2018-10-26 NOTE — Progress Notes (Signed)
HD tx start    10/26/18 1413  Vital Signs  Pulse Rate (!) 48  Pulse Rate Source Monitor  Resp 19  BP (!) 141/58  BP Location Right Arm  BP Method Automatic  Patient Position (if appropriate) Lying  Oxygen Therapy  SpO2 97 %  O2 Device Nasal Cannula  O2 Flow Rate (L/min) 2 L/min  During Hemodialysis Assessment  Blood Flow Rate (mL/min) 400 mL/min  Arterial Pressure (mmHg) -170 mmHg  Venous Pressure (mmHg) 140 mmHg  Transmembrane Pressure (mmHg) 50 mmHg  Ultrafiltration Rate (mL/min) 1330 mL/min  Dialysate Flow Rate (mL/min) 600 ml/min  Conductivity: Machine  14  HD Safety Checks Performed Yes  Dialysis Fluid Bolus Normal Saline  Bolus Amount (mL) 250 mL  Intra-Hemodialysis Comments Tx initiated

## 2018-10-26 NOTE — Progress Notes (Signed)
Pre HD assessment    10/26/18 1406  Vital Signs  Temp 97.9 F (36.6 C)  Temp Source Oral  Pulse Rate (!) 53  Pulse Rate Source Monitor  Resp 15  BP (!) 137/55  BP Location Right Arm  BP Method Automatic  Patient Position (if appropriate) Lying  Oxygen Therapy  SpO2 99 %  O2 Device Nasal Cannula  O2 Flow Rate (L/min) 2 L/min  Pain Assessment  Pain Scale 0-10  Pain Score 0  Dialysis Weight  Weight 106.9 kg  Type of Weight Pre-Dialysis  Time-Out for Hemodialysis  What Procedure? HD  Pt Identifiers(min of two) First/Last Name;MRN/Account#  Correct Site? Yes  Correct Side? Yes  Correct Procedure? Yes  Consents Verified? Yes  Rad Studies Available? N/A  Safety Precautions Reviewed? Yes  Research scientist (physical sciences)  (2A)  Station Number 4  UF/Alarm Test Passed  Conductivity: Meter 13.6  Conductivity: Machine  13.9  pH 7.2  Reverse Osmosis main  Normal Saline Lot Number [id number]  Dialyzer Lot Number [id number]  Disposable Set Lot Number [id number]  Machine Temperature 98.6 F (37 C)  Musician and Audible Yes  Blood Lines Intact and Secured Yes  Pre Treatment Patient Checks  Vascular access used during treatment Catheter  Hepatitis B Surface Antigen Results Negative  Date Hepatitis B Surface Antigen Drawn 10/22/18  Hepatitis B Surface Antibody  (<10)  Date Hepatitis B Surface Antibody Drawn 10/22/18  Hemodialysis Consent Verified Yes  Hemodialysis Standing Orders Initiated Yes  ECG (Telemetry) Monitor On Yes  Prime Ordered Normal Saline  Length of  DialysisTreatment -hour(s) 3 Hour(s)  Dialyzer Elisio 17H NR  Dialysate 2K, 2.5 Ca  Dialysis Anticoagulant None  Dialysate Flow Ordered 600  Blood Flow Rate Ordered 400 mL/min  Ultrafiltration Goal 3.5 Liters  Pre Treatment Labs CBC;Other (Comment) (BMP, INR, Heparin level)  Dialysis Blood Pressure Support Ordered Normal Saline  Education / Care Plan  Dialysis Education Provided Yes  Documented Education  in Care Plan Yes

## 2018-10-26 NOTE — Consult Note (Signed)
Cardiology Consultation:   Patient ID: Caroline Hernandez MRN: [id number]; DOB: 1946/04/10  Admit date: 10/16/2018 Date of Consult: 10/26/2018  Primary Care Provider: System, Provider Not In Primary Cardiologist: Ida Rogue, MD -[facility] Physician requesting consult: Dr. Bridgett Larsson Reason for consult: Bradycardia, atrial fibrillation, discussion of anticoagulation options   Patient Profile:   Caroline Hernandez is a 73 y.o. female with a hx of  74 y.o. 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,  atrial fibrillation starting possibly in April 2016,  noted again June 2016,  acute on chronic diastolic CHF initially April 2016,   admitted to the hospital with diastolic CHF/leg swelling,  abdominal bloating, angina,  She has bilateral prosthesis She has pacer ICD ablation for atrial fibrillation 07/18/2016 Recurrent atrial fibrillation when last seen in clinic September 2019, persistent since that time Presenting to the hospital with respiratory distress, urinary tract infection, acute on chronic renal failure    History of Present Illness:   Caroline Hernandez has had a long hospital course for acute bronchitis treated with steroids, nebulizers,, and urinary tract infection Klebsiella and urine cultures treated with antibiotics, developed worsening renal failure now on hemodialysis after port placement, persistent atrial fibrillation,  Notes indicating recent bradycardia.   Discussion with her while on dialysis this afternoon, Reports shortness of breath is dramatically improving after third round of dialysis, past 3 days Leg swelling is improving, cough improving Reports she is not particularly symptomatic from the atrial fibrillation but has not been  active while in the hospital  No family at the bedside  Review of recent records shows that she was recently seen by Dr. Caryl Comes, EP restarted on amiodarone with plan for  cardioversion in 3 weeks time She continues on amiodarone 400 twice daily No other rate controlling agents As an outpatient was on carvedilol 3.125 mg twice daily, this has been held  INR subtherapeutic for quite a few days in the setting of placement of a port  Heart rate low 50s during dialysis   Past Medical History:  Diagnosis Date  . Amputation of left lower extremity below knee ([place])   . Amputation of right lower extremity below knee ([place])   . CAD (coronary artery disease)    a. 2004 Cardiac Arrest/CABG x 3 (LIMA->LAD, VG->OM, VG->RCA);  b. 06/2015 lexiscan MV: no significant ischemia, EF 48%, low risk->Med Rx.  . Chronic combined systolic and diastolic CHF (congestive heart failure) ([place])    a. 12/2014 Echo: EF 45-50%;  b. 08/2015 Echo: EF 45-50%, ant, antsept HK, mildly dil LA, nl RV, mild-mod TR, sev PAH (92mmHg); b. 08/2017 TEE: EF 40-45%, large PFO w/ L->R shunting.  . CKD (chronic kidney disease), stage IV ([place])   . CLL (chronic lymphocytic leukemia) ([place])   . Diabetes mellitus without complication ([place])   . Essential hypertension   . GERD (gastroesophageal reflux disease)   . Hiatal hernia   . History of cardiac arrest    a. 2004.  Marland Kitchen Hyperlipidemia   . Ischemic cardiomyopathy    a. s/p MDT ICD (originally had 9417 lead-->gen change and lead revision ~ 2012 @ [place]); b. 12/2014 Echo: EF 45-50%;  c.08/2015 Echo: EF 45-50%; d. 08/2017 Echo: EF 40-45%.  Marland Kitchen PAD (peripheral artery disease) (H[facility])    a. s/p bilat BKA  . Persistent atrial fibrillation    a. Dx 12/2014.  CHA2DS2VASc = 6--> warfarin;  b. 09/2015 s/p DCCV-->on  amio; c. s/p DCCV 04/25/16; d. 07/2016 s/p RFCA/PVI in setting of recurrent afib despite amio; e. 05/2018 Recurrent afib.    Past Surgical History:  Procedure Laterality Date  . ABDOMINAL HYSTERECTOMY    . Amputation lower extremity bilaterally Bilateral   . CARDIAC CATHETERIZATION    . CARDIOVERSION N/A 10/09/2016   Procedure: CARDIOVERSION;  Surgeon: Lelon Perla, MD;  Location: [facility] ENDOSCOPY;  Service: Cardiovascular;  Laterality: N/A;  . CORONARY ARTERY BYPASS GRAFT    . DIALYSIS/PERMA CATHETER INSERTION N/A 10/22/2018   Procedure: DIALYSIS/PERMA CATHETER INSERTION;  Surgeon: Katha Cabal, MD;  Location: [place] CV LAB;  Service: Cardiovascular;  Laterality: N/A;  . ELECTROPHYSIOLOGIC STUDY N/A 10/07/2015   Procedure: CARDIOVERSION;  Surgeon: Minna Merritts, MD;  Location: [facility] ORS;  Service: Cardiovascular;  Laterality: N/A;  . ELECTROPHYSIOLOGIC STUDY N/A 04/25/2016   Procedure: Cardioversion;  Surgeon: Minna Merritts, MD;  Location: [facility] ORS;  Service: Cardiovascular;  Laterality: N/A;  . ELECTROPHYSIOLOGIC STUDY N/A 07/18/2016   Procedure: Atrial Fibrillation Ablation;  Surgeon: Thompson Grayer, MD;  Location: [place] CV LAB;  Service: Cardiovascular;  Laterality: N/A;  . IMPLANTABLE CARDIOVERTER DEFIBRILLATOR IMPLANT  2005   [employer]   . TEE WITHOUT CARDIOVERSION N/A 07/18/2016   Procedure: TRANSESOPHAGEAL ECHOCARDIOGRAM (TEE);  Surgeon: Sueanne Margarita, MD;  Location: [facility] ENDOSCOPY;  Service: Cardiovascular;  Laterality: N/A;  . TEE WITHOUT CARDIOVERSION N/A 10/09/2016   Procedure: TRANSESOPHAGEAL ECHOCARDIOGRAM (TEE);  Surgeon: Lelon Perla, MD;  Location: [facility] ENDOSCOPY;  Service: Cardiovascular;  Laterality: N/A;     Home Medications:  Prior to Admission medications   Medication Sig Start Date End Date Taking? Authorizing Provider  albuterol (PROVENTIL HFA;VENTOLIN HFA) 108 (90 Base) MCG/ACT inhaler Inhale 2 puffs into the lungs every 6 (six) hours as needed for wheezing or shortness of breath. 03/10/18  Yes Sudini, Alveta Heimlich, MD  amitriptyline (ELAVIL) 25 MG tablet Take 25 mg by mouth at bedtime.    Yes [provider]  amLODipine (NORVASC) 10 MG tablet TAKE 1 TABLET(10 MG) BY MOUTH DAILY 08/19/18  Yes Tracy Gerken, Kathlene November, MD  atorvastatin (LIPITOR) 40 MG tablet Take 40 mg by mouth at bedtime.    Yes [provider]  carvedilol (COREG) 3.125 MG tablet Take 1 tablet (3.125 mg) by mouth once daily 10/15/18  Yes Deboraha Sprang, MD  Cholecalciferol (HM VITAMIN D3) 100 MCG (4000 UT) CAPS Take 4,000 Units by mouth daily.    Yes [provider]  gabapentin (NEURONTIN) 100 MG capsule Take 100 mg by mouth 2 (two) times daily.   Yes [provider]  insulin aspart (NOVOLOG) 100 UNIT/ML injection Inject 5-10 Units into the skin 3 (three) times daily before meals. 10 units into the skin before breakfast then 5 units before lunch then 10 units before supper (evening meal)   Yes [provider]  insulin glargine (LANTUS) 100 UNIT/ML injection Inject 14-38 Units into the skin 2 (two) times daily before a meal. 38 units into the skin before breakfast and 14 units before supper (evening meal)   Yes [provider]  iron polysaccharides (NIFEREX) 150 MG capsule Take 150 mg by mouth daily.   Yes [provider]  isosorbide mononitrate (IMDUR) 30 MG 24 hr tablet TAKE 2 TABLETS(60 MG) BY MOUTH TWICE DAILY 08/19/18  Yes Kamalani Mastro, Kathlene November, MD  losartan (COZAAR) 50 MG tablet Take 1 tablet (50 mg total) by mouth daily. 10/15/18 01/13/19 Yes Deboraha Sprang, MD  Multiple  Vitamins-Minerals (VITEYES AREDS FORMULA/LUTEIN) CAPS Take 1 capsule by mouth 2 (two) times daily.   Yes [provider]  nitroGLYCERIN (NITROSTAT) 0.4 MG SL tablet Place 1 tablet (0.4 mg total) under the tongue every 5 (five) minutes as needed for chest pain. 06/14/15  Yes Mody, Ulice Bold, MD  pantoprazole (PROTONIX) 40 MG tablet Take 40 mg by mouth at bedtime. 06/19/16  Yes [provider]  sertraline (ZOLOFT) 100 MG tablet Take 100 mg by mouth at bedtime.  06/02/15  Yes [provider]  torsemide (DEMADEX) 20 MG tablet Take 2 tablets (40 mg total) by mouth 2 (two) times daily. 10/08/18  Yes Theora Gianotti, NP  vitamin C (ASCORBIC ACID) 500 MG tablet Take 500 mg by mouth daily.   Yes  [provider]  warfarin (COUMADIN) 4 MG tablet TAKE AS DIRECTED BY COUMADIN CLINIC 10/02/18  Yes Davelyn Gwinn, Kathlene November, MD  amiodarone (PACERONE) 400 MG tablet Take 1 tablet (400 mg) by mouth twice daily x 2 weeks, then take 1 tablet (400 mg) by mouth once daily x 2 weeks 10/15/18   Deboraha Sprang, MD  PREVIDENT 5000 DRY MOUTH 1.1 % GEL dental gel Place 1 application onto teeth daily.     [provider]    Inpatient Medications: Scheduled Meds: . albuterol  2.5 mg Nebulization TID  . amitriptyline  25 mg Oral QHS  . amLODipine  10 mg Oral Daily  . atorvastatin  40 mg Oral QHS  . budesonide (PULMICORT) nebulizer solution  0.5 mg Nebulization BID  . Chlorhexidine Gluconate Cloth  6 each Topical Q0600  . feeding supplement (NEPRO CARB STEADY)  237 mL Oral BID BM  . gabapentin  100 mg Oral BID  . Gerhardt's butt cream   Topical BID  . insulin aspart  0-15 Units Subcutaneous TID WC  . insulin aspart  0-5 Units Subcutaneous QHS  . insulin aspart  5 Units Subcutaneous TID WC  . insulin glargine  15 Units Subcutaneous BID  . isosorbide mononitrate  30 mg Oral Daily  . lactulose  20 g Oral BID  . multivitamin  1 tablet Oral QHS  . pantoprazole  40 mg Oral QHS  . senna  1 tablet Oral Daily  . sertraline  100 mg Oral QHS  . sodium chloride flush  3 mL Intravenous Q12H  . Warfarin - Pharmacist Dosing Inpatient   Does not apply q1800   Continuous Infusions: . sodium chloride Stopped (10/22/18 0104)  . clindamycin (CLEOCIN) IV    . heparin 900 Units/hr (10/25/18 2021)   PRN Meds: sodium chloride, acetaminophen **OR** acetaminophen, guaiFENesin-dextromethorphan, hydrALAZINE, hydrOXYzine, ipratropium, nitroGLYCERIN, ondansetron **OR** ondansetron (ZOFRAN) IV, traMADol  Allergies:    Allergies  Allergen Reactions  . Piperacillin Other (See Comments)    Renal failure  . 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 See ID note**PER PT-CAUSED  RENAL FAILURE**  . Zosyn [Piperacillin Sod-Tazobactam So] Other (See Comments)    Renal failure    Social History:   Social History   Socioeconomic History  . Marital status: Divorced    Spouse name: Not on file  . Number of children: Not on file  . Years of education: Not on file  . Highest education level: Not on file  Occupational History  . Not on file  Social Needs  . Financial resource strain: Not on file  . Food insecurity:    Worry: Not on file    Inability: Not  on file  . Transportation needs:    Medical: Not on file    Non-medical: Not on file  Tobacco Use  . Smoking status: Never Smoker  . Smokeless tobacco: Never Used  Substance and Sexual Activity  . Alcohol use: No  . Drug use: No  . Sexual activity: Not on file  Lifestyle  . Physical activity:    Days per week: Not on file    Minutes per session: Not on file  . Stress: Not on file  Relationships  . Social connections:    Talks on phone: Not on file    Gets together: Not on file    Attends religious service: Not on file    Active member of club or organization: Not on file    Attends meetings of clubs or organizations: Not on file    Relationship status: Not on file  . Intimate partner violence:    Fear of current or ex partner: Not on file    Emotionally abused: Not on file    Physically abused: Not on file    Forced sexual activity: Not on file  Other Topics Concern  . Not on file  Social History Narrative  . Not on file    Family History:    Family History  Problem Relation Age of Onset  . CAD Mother   . Diabetes Mother   . Atrial fibrillation Mother   . Lung cancer Father   . Diabetes Father      ROS:  Please see the history of present illness.  Review of Systems  Constitutional: Positive for malaise/fatigue.  Respiratory: Positive for cough and shortness of breath.   Cardiovascular: Negative.   Gastrointestinal: Negative.   Musculoskeletal: Negative.   Neurological: Negative.    Psychiatric/Behavioral: Negative.   All other systems reviewed and are negative.   Physical Exam/Data:   Vitals:   10/26/18 1721 10/26/18 1727 10/26/18 1728 10/26/18 1754  BP: (!) 144/113 (!) 152/62  (!) 132/58  Pulse: (!) 54 (!) 54 (!) 55 (!) 54  Resp: 18 15 16    Temp:  98.7 F (37.1 C)  98.7 F (37.1 C)  TempSrc:  Oral  Oral  SpO2: 100% 100% 100% 99%  Weight:  103.8 kg    Height:        Intake/Output Summary (Last 24 hours) at 10/26/2018 1840 Last data filed at 10/26/2018 1727 Gross per 24 hour  Intake 586.36 ml  Output 6059 ml  Net -5472.64 ml   Last 3 Weights 10/26/2018 10/26/2018 10/26/2018  Weight (lbs) 228 lb 13.4 oz 235 lb 10.8 oz 231 lb 11.2 oz  Weight (kg) 103.8 kg 106.9 kg 105.098 kg     Body mass index is 34.79 kg/m.  General:  Well nourished, well developed, in no acute distress,  HEENT: normal Lymph: no adenopathy Neck: Unable to estimate JVD, grossly normal Endocrine:  No thryomegaly Vascular: No carotid bruits; FA pulses 2+ bilaterally without bruits  Cardiac: Irregularly irregular, bradycardic no murmur  Lungs:  clear to auscultation bilaterally, no wheezing, rhonchi or rales  Abd: soft, nontender, no hepatomegaly  Ext: no edema Musculoskeletal: Amputation lower extremities below the knee Skin: warm and dry  Neuro: Limited exam performed Psych:  Normal affect   EKG:  The EKG was personally reviewed and demonstrates:   Telemetry:  Telemetry was personally reviewed and demonstrates:     Relevant CV Studies: Echocardiogram  1. The left ventricle has moderately reduced systolic function of 71-06%. The cavity size  is mildly increased. There is no left ventricular wall thickness. The left ventricular diastology could not be evaluated due to nondiagnostic images.  2. There is severe hypokinesis of the entire septal, anteroseptal and anterior left ventricular segments.  3. Mildly dilated left atrial size.  4. The mitral valve normal in structure.  Regurgitation is mild by color flow Doppler.  5. Normal tricuspid valve.  6. Tricuspid regurgitation mild-moderate.  7. The aortic valve normal. There is moderate sclerosis of the aortic valve.  8. The right ventricle is mildly enlarged in size. There is normal systolic function. Right ventricular systolic pressure is mildly elevated with an estimated pressure of 42.4 mmHg.   Laboratory Data:  Chemistry Recent Labs  Lab 10/23/18 0726 10/25/18 0754 10/26/18 1129  NA 134* 139 136  K 4.4 3.5 3.8  CL 102 104 99  CO2 20* 30 31  GLUCOSE 185* 57* 143*  BUN 117* 44* 28*  CREATININE 3.87* 2.06* 2.09*  CALCIUM 8.3* 8.1* 7.7*  GFRNONAA 11* 24* 23*  GFRAA 13* 28* 27*  ANIONGAP 12 5 6     Recent Labs  Lab 10/22/18 0433 10/26/18 1129  ALBUMIN 3.6 3.2*   Hematology Recent Labs  Lab 10/24/18 0613 10/25/18 0754 10/26/18 0332  WBC 34.6* 41.7* 44.5*  RBC 4.23 4.18 4.09  HGB 10.7* 10.6* 10.2*  HCT 34.7* 34.5* 34.2*  MCV 82.0 82.5 83.6  MCH 25.3* 25.4* 24.9*  MCHC 30.8 30.7 29.8*  RDW 18.5* 18.3* 18.3*  PLT 140* 146* 129*   Cardiac EnzymesNo results for input(s): TROPONINI in the last 168 hours. No results for input(s): TROPIPOC in the last 168 hours.  BNPNo results for input(s): BNP, PROBNP in the last 168 hours.  DDimer No results for input(s): DDIMER in the last 168 hours.  Radiology/Studies:  Dg Chest 2 View  Result Date: 10/24/2018 CLINICAL DATA:  Cough.  Leukocytosis. EXAM: CHEST - 2 VIEW COMPARISON:  10/16/2018 FINDINGS: Previous median sternotomy. Pacemaker AICD appears unchanged. Newly seen right internal jugular central line tip at the SVC RA junction. Mild cardiomegaly as seen previously. The right lung remains clear. Mild patchy density at the left base could be mild left base atelectasis or mild left lower lobe pneumonia. No acute bone finding. IMPRESSION: Patchy density in the left lower lobe could be atelectasis or pneumonia. Electronically Signed   By: Nelson Chimes  M.D.   On: 10/24/2018 10:54    Assessment and Plan:   1. Atrial fibrillation, persistent As he has been subtherapeutic would hold the amiodarone Amiodarone also likely contributing to her bradycardia She seems relatively asymptomatic from the atrial fibrillation -Long discussion concerning anticoagulation options Currently on warfarin.  He should be a candidate for NOAC but would need to discuss ideal dosing.  Consider Eliquis  -Would continue to hold carvedilol if rate continues to run low. Carvedilol can be restarted at a later date for rate control as amiodarone effect improves  2) cardiomyopathy, ischemic Ejection fraction lower now 35 to 40%, 2018 was 40 to 45% Possibly depressed secondary to arrhythmia/atrial fibrillation Also underlying coronary disease, history of bypass Denies having anginal symptoms, no ischemic work-up  3) PAD History of bilateral below the knee amputations  4) CAD with stable angina Pacer, ICD  5) acute bronchitis Improving with supportive therapy, notes indicating viral  6) acute on chronic renal failure On hemodialysis Improved shortness of breath   Total encounter time more than 110 minutes  Greater than 50% was spent in counseling and coordination of care  with the patient   For questions or updates, please contact [place] Please consult [email or web address] for contact info under     Signed, Ida Rogue, MD  10/26/2018 6:40 PM

## 2018-10-26 NOTE — Progress Notes (Signed)
PT Cancellation Note  Patient Details Name: Alayia Meggison MRN: [id number] DOB: 1946-01-19   Cancelled Treatment:    Reason Eval/Treat Not Completed: Patient at procedure or test/unavailable Patient at dialysis. Will attempt again at later time.   Janna Arch, PT, DPT   10/26/2018, 2:59 PM

## 2018-10-27 ENCOUNTER — Inpatient Hospital Stay: Payer: Medicare HMO

## 2018-10-27 DIAGNOSIS — R0902 Hypoxemia: Secondary | ICD-10-CM

## 2018-10-27 LAB — BASIC METABOLIC PANEL
Anion gap: 7 (ref 5–15)
BUN: 17 mg/dL (ref 8–23)
CO2: 31 mmol/L (ref 22–32)
Calcium: 7.9 mg/dL — ABNORMAL LOW (ref 8.9–10.3)
Chloride: 98 mmol/L (ref 98–111)
Creatinine, Ser: 2.03 mg/dL — ABNORMAL HIGH (ref 0.44–1.00)
GFR calc Af Amer: 28 mL/min — ABNORMAL LOW (ref >60)
GFR calc non Af Amer: 24 mL/min — ABNORMAL LOW (ref >60)
Glucose, Bld: 134 mg/dL — ABNORMAL HIGH (ref 70–99)
Potassium: 3.6 mmol/L (ref 3.5–5.1)
Sodium: 136 mmol/L (ref 135–145)

## 2018-10-27 LAB — CBC
HEMATOCRIT: 33.8 % — AB (ref 36.0–46.0)
Hemoglobin: 10.2 g/dL — ABNORMAL LOW (ref 12.0–15.0)
MCH: 25.4 pg — ABNORMAL LOW (ref 26.0–34.0)
MCHC: 30.2 g/dL (ref 30.0–36.0)
MCV: 84.3 fL (ref 80.0–100.0)
Platelets: 123 10*3/uL — ABNORMAL LOW (ref 150–400)
RBC: 4.01 MIL/uL (ref 3.87–5.11)
RDW: 18.5 % — ABNORMAL HIGH (ref 11.5–15.5)
WBC: 49.2 10*3/uL — ABNORMAL HIGH (ref 4.0–10.5)
nRBC: 0 % (ref 0.0–0.2)

## 2018-10-27 LAB — PROTIME-INR
INR: 1.16
Prothrombin Time: 14.7 seconds (ref 11.4–15.2)

## 2018-10-27 LAB — GLUCOSE, CAPILLARY
GLUCOSE-CAPILLARY: 186 mg/dL — AB (ref 70–99)
Glucose-Capillary: 137 mg/dL — ABNORMAL HIGH (ref 70–99)
Glucose-Capillary: 132 mg/dL — ABNORMAL HIGH (ref 70–99)
Glucose-Capillary: 114 mg/dL — ABNORMAL HIGH (ref 70–99)
Glucose-Capillary: 114 mg/dL — ABNORMAL HIGH (ref 70–99)

## 2018-10-27 LAB — HEPARIN LEVEL (UNFRACTIONATED): Heparin Unfractionated: 0.38 IU/mL (ref 0.30–0.70)

## 2018-10-27 MED ORDER — APIXABAN 5 MG PO TABS
5.0000 mg | ORAL_TABLET | Freq: Two times a day (BID) | ORAL | Status: DC
  Administered 2018-10-27 – 2018-10-30 (×8): 5 mg via ORAL
  Filled 2018-10-27 (×8): qty 1

## 2018-10-27 MED ORDER — BUDESONIDE 0.5 MG/2ML IN SUSP: 0.5000 mg | Freq: Two times a day (BID) | RESPIRATORY_TRACT | 2 refills | Status: DC

## 2018-10-27 MED ORDER — ISOSORBIDE MONONITRATE ER 30 MG PO TB24: 30.0000 mg | ORAL_TABLET | Freq: Every day | ORAL | 1 refills | Status: DC

## 2018-10-27 MED ORDER — INSULIN GLARGINE 100 UNIT/ML ~~LOC~~ SOLN: 15.0000 [IU] | Freq: Two times a day (BID) | SUBCUTANEOUS | 1 refills | Status: DC

## 2018-10-27 MED ORDER — TORSEMIDE 20 MG PO TABS
40.0000 mg | ORAL_TABLET | Freq: Every day | ORAL | Status: DC
  Administered 2018-10-27 – 2018-10-30 (×4): 40 mg via ORAL
  Filled 2018-10-27 (×4): qty 2

## 2018-10-27 MED ORDER — APIXABAN 5 MG PO TABS: 5.0000 mg | ORAL_TABLET | Freq: Two times a day (BID) | ORAL | 1 refills | Status: DC

## 2018-10-27 MED ORDER — LEVOFLOXACIN 750 MG PO TABS
750.0000 mg | ORAL_TABLET | ORAL | Status: DC
  Administered 2018-10-27: 750 mg via ORAL
  Filled 2018-10-27: qty 1

## 2018-10-27 MED ORDER — GUAIFENESIN-DM 100-10 MG/5ML PO SYRP: 5.0000 mL | ORAL_SOLUTION | ORAL | 0 refills | Status: DC | PRN

## 2018-10-27 MED ORDER — TORSEMIDE 20 MG PO TABS: 20.0000 mg | ORAL_TABLET | Freq: Every day | ORAL | Status: DC

## 2018-10-27 NOTE — Consult Note (Signed)
ANTICOAGULATION CONSULT NOTE  Pharmacy Consult for Heparin infusion and Warfarin Indication: atrial fibrillation  Patient Measurements: Height: 5\' 8"  (172.7 cm) Weight: 227 lb 9.6 oz (103.2 kg) IBW/kg (Calculated) : 63.9 Heparin Dosing Weight: 84.8 kg  Vital Signs: Temp: 99 F (37.2 C) (02/16 0356) Temp Source: Oral (02/16 0356) BP: 132/61 (02/16 0356) Pulse Rate: 49 (02/16 0356)  Labs: Recent Labs    10/25/18 0754  10/26/18 0332 10/26/18 1129 10/27/18 0501  HGB 10.6*  --  10.2*  --  10.2*  HCT 34.5*  --  34.2*  --  33.8*  PLT 146*  --  129*  --  123*  APTT 72*  --   --   --   --   LABPROT 14.5  --   --  13.9 14.7  INR 1.14  --   --  1.08 1.16  HEPARINUNFRC 0.40   < > 0.34 0.42 0.38  CREATININE 2.06*  --   --  2.09* 2.03*   < > = values in this interval not displayed.    Estimated Creatinine Clearance: 32.4 mL/min (A) (by C-G formula based on SCr of 2.03 mg/dL (H)).   Medical History: Past Medical History:  Diagnosis Date  . Amputation of left lower extremity below knee ([place])   . Amputation of right lower extremity below knee ([place])   . CAD (coronary artery disease)    a. 2004 Cardiac Arrest/CABG x 3 (LIMA->LAD, VG->OM, VG->RCA);  b. 06/2015 lexiscan MV: no significant ischemia, EF 48%, low risk->Med Rx.  . Chronic combined systolic and diastolic CHF (congestive heart failure) ([place])    a. 12/2014 Echo: EF 45-50%;  b. 08/2015 Echo: EF 45-50%, ant, antsept HK, mildly dil LA, nl RV, mild-mod TR, sev PAH (63mmHg); b. 08/2017 TEE: EF 40-45%, large PFO w/ L->R shunting.  . CKD (chronic kidney disease), stage IV ([place])   . CLL (chronic lymphocytic leukemia) ([place])   . Diabetes mellitus without complication ([place])   . Essential hypertension   . GERD (gastroesophageal reflux disease)   . Hiatal hernia   . History of cardiac arrest    a. 2004.  Marland Kitchen Hyperlipidemia   . Ischemic cardiomyopathy    a. s/p MDT ICD (originally had 8416 lead-->gen change and lead revision ~ 2012 @  [place]); b. 12/2014 Echo: EF 45-50%;  c.08/2015 Echo: EF 45-50%; d. 08/2017 Echo: EF 40-45%.  Marland Kitchen PAD (peripheral artery disease) ([facility])    a. s/p bilat BKA  . Persistent atrial fibrillation    a. Dx 12/2014.  CHA2DS2VASc = 6--> warfarin;  b. 09/2015 s/p DCCV-->on amio; c. s/p DCCV 04/25/16; d. 07/2016 s/p RFCA/PVI in setting of recurrent afib despite amio; e. 05/2018 Recurrent afib.    Medications:  Scheduled:  . albuterol  2.5 mg Nebulization TID  . amitriptyline  25 mg Oral QHS  . amLODipine  10 mg Oral Daily  . atorvastatin  40 mg Oral QHS  . budesonide (PULMICORT) nebulizer solution  0.5 mg Nebulization BID  . Chlorhexidine Gluconate Cloth  6 each Topical Q0600  . feeding supplement (NEPRO CARB STEADY)  237 mL Oral BID BM  . gabapentin  100 mg Oral BID  . Gerhardt's butt cream   Topical BID  . insulin aspart  0-15 Units Subcutaneous TID WC  . insulin aspart  0-5 Units Subcutaneous QHS  . insulin aspart  5 Units Subcutaneous TID WC  . insulin glargine  15 Units Subcutaneous BID  . isosorbide mononitrate  30 mg Oral Daily  .  lactulose  20 g Oral BID  . multivitamin  1 tablet Oral QHS  . pantoprazole  40 mg Oral QHS  . senna  1 tablet Oral Daily  . sertraline  100 mg Oral QHS  . sodium chloride flush  3 mL Intravenous Q12H  . Warfarin - Pharmacist Dosing Inpatient   Does not apply q1800   Infusions:  . sodium chloride Stopped (10/22/18 0104)  . clindamycin (CLEOCIN) IV    . heparin 900 Units/hr (10/26/18 2122)    Assessment: Patient previously being anticoagulated with warfarin.  Patient now being transitioned to heparin infusion due to planned procedures of Permcath placement and peritoneal dialysis access.  Due to this vascular surgery is planning to give patient Vitamin K 5 mg PO once for warfarin reversal with INR 2.14.    Per discussion with Marcelle Overlie, PA-C will start heparin drip without bolus.  Typical transition would be initiated when INR is a lower limit of goal  range without bolus and dosed per indication.  Heparin is currently running at 1200 units/hr and level has not been checked prior to now (first level check was delayed)  2/11 1026 HL = 0.55 Therapeutic x 1 2/11 2254 HL = 0.27 (but drip had been paused) - restarted at same rate 2/12 0726 HL = 1.06  At this point Heparin drip was stopped and Warfarin was resumed - however, patient is going to have another procedure tomorrow (2/14) and so warfarin is being held (INR this am 1.12) in lieu of this procedure and heparin drip is being restart with a bolus at the previously adjusted rate  2/14 0100 HL 0.79 (mildly supratherapeutic - dose decreased to 900 units/hr) 2/14 0754 HL 0.40 2/15 0330 HL 0.34 2/15 1129 HL 0.42   WARFARIN: 2/14 INR 1.14     2/15 INR    Goal of Therapy:  Heparin level 0.3-0.7 units/ml Monitor platelets by anticoagulation protocol: Yes   Plan:  Heparin :  02/16 @ 0500 HL 0.38 therapeutic. Will continue current rate and will recheck HL w/ am labs. CBC low but stable, will continue to monitor.  Tobie Lords, PharmD, BCPS Clinical Pharmacist 10/27/2018

## 2018-10-27 NOTE — Progress Notes (Signed)
ANTICOAGULATION CONSULT NOTE - Initial Consult  Pharmacy Consult for apixaban Indication: atrial fibrillation  Allergies  Allergen Reactions  . Piperacillin Other (See Comments)    Renal failure  . 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 See ID note**PER PT-CAUSED RENAL FAILURE**  . Zosyn [Piperacillin Sod-Tazobactam So] Other (See Comments)    Renal failure    Patient Measurements: Height: 5\' 8"  (172.7 cm) Weight: 227 lb 9.6 oz (103.2 kg) IBW/kg (Calculated) : 63.9 Heparin Dosing Weight:    Vital Signs: Temp: 99 F (37.2 C) (02/16 0356) Temp Source: Oral (02/16 0356) BP: 132/61 (02/16 0356) Pulse Rate: 49 (02/16 0356)  Labs: Recent Labs    10/25/18 0754  10/26/18 0332 10/26/18 1129 10/27/18 0501  HGB 10.6*  --  10.2*  --  10.2*  HCT 34.5*  --  34.2*  --  33.8*  PLT 146*  --  129*  --  123*  APTT 72*  --   --   --   --   LABPROT 14.5  --   --  13.9 14.7  INR 1.14  --   --  1.08 1.16  HEPARINUNFRC 0.40   < > 0.34 0.42 0.38  CREATININE 2.06*  --   --  2.09* 2.03*   < > = values in this interval not displayed.    Estimated Creatinine Clearance: 32.4 mL/min (A) (by C-G formula based on SCr of 2.03 mg/dL (H)).   Medical History: Past Medical History:  Diagnosis Date  . Amputation of left lower extremity below knee ([place])   . Amputation of right lower extremity below knee ([place])   . CAD (coronary artery disease)    a. 2004 Cardiac Arrest/CABG x 3 (LIMA->LAD, VG->OM, VG->RCA);  b. 06/2015 lexiscan MV: no significant ischemia, EF 48%, low risk->Med Rx.  . Chronic combined systolic and diastolic CHF (congestive heart failure) ([place])    a. 12/2014 Echo: EF 45-50%;  b. 08/2015 Echo: EF 45-50%, ant, antsept HK, mildly dil LA, nl RV, mild-mod TR, sev PAH (61mmHg); b. 08/2017 TEE: EF 40-45%, large PFO w/ L->R shunting.  . CKD (chronic kidney disease), stage IV ([place])   . CLL (chronic lymphocytic leukemia) ([place])   . Diabetes mellitus  without complication ([place])   . Essential hypertension   . GERD (gastroesophageal reflux disease)   . Hiatal hernia   . History of cardiac arrest    a. 2004.  Marland Kitchen Hyperlipidemia   . Ischemic cardiomyopathy    a. s/p MDT ICD (originally had 4008 lead-->gen change and lead revision ~ 2012 @ [facility]); b. 12/2014 Echo: EF 45-50%;  c.08/2015 Echo: EF 45-50%; d. 08/2017 Echo: EF 40-45%.  Marland Kitchen PAD (peripheral artery disease) ([facility])    a. s/p bilat BKA  . Persistent atrial fibrillation    a. Dx 12/2014.  CHA2DS2VASc = 6--> warfarin;  b. 09/2015 s/p DCCV-->on amio; c. s/p DCCV 04/25/16; d. 07/2016 s/p RFCA/PVI in setting of recurrent afib despite amio; e. 05/2018 Recurrent afib.    Medications:  Scheduled:  . albuterol  2.5 mg Nebulization TID  . amitriptyline  25 mg Oral QHS  . amLODipine  10 mg Oral Daily  . apixaban  5 mg Oral BID  . atorvastatin  40 mg Oral QHS  . budesonide (PULMICORT) nebulizer solution  0.5 mg Nebulization BID  . Chlorhexidine Gluconate Cloth  6 each Topical Q0600  . feeding supplement (NEPRO CARB STEADY)  237 mL Oral BID BM  . gabapentin  100 mg Oral BID  . Gerhardt's butt cream   Topical BID  . insulin aspart  0-15 Units Subcutaneous TID WC  . insulin aspart  0-5 Units Subcutaneous QHS  . insulin aspart  5 Units Subcutaneous TID WC  . insulin glargine  15 Units Subcutaneous BID  . isosorbide mononitrate  30 mg Oral Daily  . lactulose  20 g Oral BID  . multivitamin  1 tablet Oral QHS  . pantoprazole  40 mg Oral QHS  . senna  1 tablet Oral Daily  . sertraline  100 mg Oral QHS  . sodium chloride flush  3 mL Intravenous Q12H   Infusions:  . sodium chloride Stopped (10/22/18 0104)  . clindamycin (CLEOCIN) IV      Assessment: Patient previously being anticoagulated with warfarin.  Patient now being transitioned to heparin infusion due to planned procedures of Permcath placement and peritoneal dialysis access.  Due to this vascular surgery is planning to give patient Vitamin K  5 mg PO once for warfarin reversal with INR 2.14.  Patient on Heparin drip and had consult for Warfarin , now with consult for Apixaban.  Hemodialysis pt, Hgb 10.2 Plt 123 INR 1.16   Goal of Therapy:   Monitor platelets by anticoagulation protocol: Yes   Plan:  Will order apixaban 5 mg bid. Will stop Heparin drip and give apixaban at same time (or within an hour). Have discussed with nurse.     Keigen Caddell A 10/27/2018,7:13 AM

## 2018-10-27 NOTE — Progress Notes (Addendum)
Pharmacy called and reported that heparin is discontinued and transitioning the pt to apixaban (Eliquis) 5 mg tablet 2 times daily. Pharmacy instructed to give Apixaban 1 hour after stopping heparin. Heparin was stopped at 0720. Will notify incoming shift. Will continue to monitor.

## 2018-10-27 NOTE — Plan of Care (Signed)
  Problem: Education: Goal: Ability to demonstrate management of disease process will improve Outcome: Progressing Goal: Individualized Educational Video(s) Outcome: Progressing   Problem: Education: Goal: Knowledge of General Education information will improve Description Including pain rating scale, medication(s)/side effects and non-pharmacologic comfort measures Outcome: Progressing   Problem: Clinical Measurements: Goal: Cardiovascular complication will be avoided Outcome: Progressing

## 2018-10-27 NOTE — Progress Notes (Addendum)
[facility] [place] NAME: Caroline Hernandez    MR#:  [id number]  DATE OF BIRTH:  December 01, 1945  SUBJECTIVE:   The patient has worsening cough with sputum today, still on oxygen via nasal cannula at 2 L REVIEW OF SYSTEMS:    Review of Systems  Constitutional: Negative.   HENT: Negative.   Eyes: Negative.   Respiratory: Cough and shortness of breath. Cardiovascular: Negative.   Gastrointestinal: Negative.   Genitourinary: Negative.   Musculoskeletal: Negative.   Skin: Negative.   Neurological: Negative.   Endo/Heme/Allergies: Negative.   Psychiatric/Behavioral: Negative.   All other systems reviewed and are negative.  DRUG ALLERGIES:   Allergies  Allergen Reactions  . Piperacillin Other (See Comments)    Renal failure  . 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 See ID note**PER PT-CAUSED RENAL FAILURE**  . Zosyn [Piperacillin Sod-Tazobactam So] Other (See Comments)    Renal failure    VITALS:  Blood pressure (!) 138/47, pulse (!) 48, temperature 98.4 F (36.9 C), temperature source Oral, resp. rate 16, height 5\' 8"  (1.727 m), weight 103.2 kg, SpO2 94 %.  PHYSICAL EXAMINATION:   Physical Exam  GENERAL:  73 y.o.-year-old patient lying in the bed on oxygen via nasal cannula.  Obesity. Unable to speak in full sentences without shortness of breath EYES: Pupils equal, round, reactive to light and accommodation. No scleral icterus. Extraocular muscles intact.  HEENT: Head atraumatic, normocephalic. Oropharynx and nasopharynx clear.  NECK:  Supple, no jugular venous distention. No thyroid enlargement, no tenderness.  LUNGS: Bilateral basilar rales, no wheezing or rhonchi.  No use of accessory muscles to breathe. CARDIOVASCULAR: S1, S2 normal. No murmurs, rubs, or gallops.  ABDOMEN: Soft, nontender, nondistended. Bowel sounds present. No organomegaly or mass.  EXTREMITIES: No cyanosis, clubbing or  edema b/l.   Bilateral below-knee amputations NEUROLOGIC: Cranial nerves II through XII are intact. No focal Motor or sensory deficits b/l.   PSYCHIATRIC: The patient is alert and oriented x 3.  SKIN: No obvious rash, lesion, or ulcer.   LABORATORY PANEL:   CBC Recent Labs  Lab 10/27/18 0501  WBC 49.2*  HGB 10.2*  HCT 33.8*  PLT 123*   ------------------------------------------------------------------------------------------------------------------ Chemistries  Recent Labs  Lab 10/25/18 0754  10/27/18 0501  NA 139   < > 136  K 3.5   < > 3.6  CL 104   < > 98  CO2 30   < > 31  GLUCOSE 57*   < > 134*  BUN 44*   < > 17  CREATININE 2.06*   < > 2.03*  CALCIUM 8.1*   < > 7.9*  MG 2.3  --   --    < > = values in this interval not displayed.   ------------------------------------------------------------------------------------------------------------------  Cardiac Enzymes No results for input(s): TROPONINI in the last 168 hours. ------------------------------------------------------------------------------------------------------------------  RADIOLOGY:  No results found.   ASSESSMENT AND PLAN:   *Acute bronchitis improved She was on oral steroid. - Scheduled Nebulizers - Inhalers -Wean O2 as tolerated -Respiratory panel with Metapneumovirus. -Wean oxygen as tolerated, NEB prn.  *Acute kidney injury over CKD stage III worsened No need for hemodialysis today per Dr. Juleen China. PD access catheter placed.  Plan PD at home.  *Acute respiratory failure due to acute on chronic systolic congestive heart failure, 35-40%.   Unable to wean off oxygen by nasal cannula, she need home oxygen 2 L by nasal cannula. DuoNeb  as needed, Robitussin as needed. Continue hemodialysis.  *UTI with leukocytosis.  Klebsiella in urine cultures.  Completed oral Keflex antibiotic  *Chronic atrial fibrillation.  Discontinue amiodarone and Coreg due to bradycardia.  No cardioversion this time  per Dr. Rockey Situ. Change to Eliquis p.o.  *Diabetes mellitus type 2.  On sliding scale insulin along with Lantus insulin Monitor blood sugars closely to avoid hypoglycemia  *Leukocytosis, history of CLL. No evidence of any sepsis. Possible due to reaction to steroid, not due to CLL per oncology consult.  Worsening leukocytosis.  Follow-up CBC.  Chest x-ray. May start levaquin per Dr. Rockey Situ.  Generalized weakness.  The patient wants to go home with home health.  Discussed with Dr. Juleen China and Dr. Rockey Situ. All the records are reviewed and case discussed with Care Management/Social Worker Management plans discussed with the patient, her daughter and they are in agreement.  CODE STATUS: Full code  DVT Prophylaxis: SCDs  TOTAL TIME TAKING CARE OF THIS PATIENT: 35 minutes.   POSSIBLE D/C IN 2 DAYS, DEPENDING ON CLINICAL CONDITION.  Demetrios Loll M.D on 10/27/2018 at 1:44 PM  Between 7am to 6pm - Pager - [phone]  After 6pm go to [email or web address] - password [email or web address] [facility]  Office  [phone]  CC: Primary care physician; System, Provider Not In  Note: This dictation was prepared with Dragon dictation along with smaller phrase technology. Any transcriptional errors that result from this process are unintentional.

## 2018-10-27 NOTE — Progress Notes (Signed)
Progress Note  Patient Name: Caroline Hernandez Date of Encounter: 10/27/2018  Primary Cardiologist: Ida Rogue, MD   Subjective   Significant purulent cough, coughing up bloody brown sputum sitting in a sputum cup Mild tightness left chest from coughing Denies any leg swelling, abdominal bloating concerning for fluid overload Scheduled for dialysis tomorrow Has completed 4 days  Chest x-ray pending  Inpatient Medications    Scheduled Meds: . albuterol  2.5 mg Nebulization TID  . amitriptyline  25 mg Oral QHS  . amLODipine  10 mg Oral Daily  . apixaban  5 mg Oral BID  . atorvastatin  40 mg Oral QHS  . budesonide (PULMICORT) nebulizer solution  0.5 mg Nebulization BID  . Chlorhexidine Gluconate Cloth  6 each Topical Q0600  . feeding supplement (NEPRO CARB STEADY)  237 mL Oral BID BM  . gabapentin  100 mg Oral BID  . Gerhardt's butt cream   Topical BID  . insulin aspart  0-15 Units Subcutaneous TID WC  . insulin aspart  0-5 Units Subcutaneous QHS  . insulin aspart  5 Units Subcutaneous TID WC  . insulin glargine  15 Units Subcutaneous BID  . isosorbide mononitrate  30 mg Oral Daily  . lactulose  20 g Oral BID  . levofloxacin  750 mg Oral Q48H  . multivitamin  1 tablet Oral QHS  . pantoprazole  40 mg Oral QHS  . senna  1 tablet Oral Daily  . sertraline  100 mg Oral QHS  . sodium chloride flush  3 mL Intravenous Q12H  . torsemide  40 mg Oral Daily   Continuous Infusions: . sodium chloride Stopped (10/22/18 0104)  . clindamycin (CLEOCIN) IV     PRN Meds: sodium chloride, acetaminophen **OR** acetaminophen, guaiFENesin-dextromethorphan, hydrALAZINE, hydrOXYzine, ipratropium, nitroGLYCERIN, ondansetron **OR** ondansetron (ZOFRAN) IV, traMADol   Vital Signs    Vitals:   10/27/18 0356 10/27/18 0804 10/27/18 1027 10/27/18 1505  BP: 132/61 (!) 138/47  (!) 142/54  Pulse: (!) 49 (!) 48  (!) 44  Resp:   16   Temp: 99 F (37.2 C) 98.4 F (36.9 C)  98.6 F (37 C)    TempSrc: Oral Oral  Oral  SpO2: 93% 94%  94%  Weight: 103.2 kg     Height:        Intake/Output Summary (Last 24 hours) at 10/27/2018 1609 Last data filed at 10/27/2018 0544 Gross per 24 hour  Intake 156.48 ml  Output 3505 ml  Net -3348.52 ml   Last 3 Weights 10/27/2018 10/26/2018 10/26/2018  Weight (lbs) 227 lb 9.6 oz 228 lb 13.4 oz 235 lb 10.8 oz  Weight (kg) 103.239 kg 103.8 kg 106.9 kg      Telemetry    Atrial fibrillation rate in the low 50s- Personally Reviewed  ECG     - Personally Reviewed  Physical Exam   Constitutional:  oriented to person, place, and time.  Mild respiratory distress.  Significant coughing HENT:  Head: Grossly normal Eyes:  no discharge. No scleral icterus.  Neck: No JVD, no carotid bruits  Cardiovascular: Bradycardic, irregularly irregular Pulmonary/Chest: Coarse breath sounds particularly on the left, Abdominal: Soft.  no distension.  no tenderness.  Musculoskeletal: Bilateral lower extremity amputations Neurological:  normal muscle tone. Coordination normal. No atrophy Skin: Skin warm and dry Psychiatric: normal affect, pleasant   Labs    Chemistry Recent Labs  Lab 10/22/18 0433  10/25/18 0754 10/26/18 1129 10/27/18 0501  NA 132*   < > 139 136  136  K 4.0   < > 3.5 3.8 3.6  CL 98   < > 104 99 98  CO2 23   < > 30 31 31   GLUCOSE 209*   < > 57* 143* 134*  BUN 114*   < > 44* 28* 17  CREATININE 4.19*   < > 2.06* 2.09* 2.03*  CALCIUM 8.2*   < > 8.1* 7.7* 7.9*  ALBUMIN 3.6  --   --  3.2*  --   GFRNONAA 10*   < > 24* 23* 24*  GFRAA 12*   < > 28* 27* 28*  ANIONGAP 11   < > 5 6 7    < > = values in this interval not displayed.     Hematology Recent Labs  Lab 10/25/18 0754 10/26/18 0332 10/27/18 0501  WBC 41.7* 44.5* 49.2*  RBC 4.18 4.09 4.01  HGB 10.6* 10.2* 10.2*  HCT 34.5* 34.2* 33.8*  MCV 82.5 83.6 84.3  MCH 25.4* 24.9* 25.4*  MCHC 30.7 29.8* 30.2  RDW 18.3* 18.3* 18.5*  PLT 146* 129* 123*    Cardiac EnzymesNo  results for input(s): TROPONINI in the last 168 hours. No results for input(s): TROPIPOC in the last 168 hours.   BNPNo results for input(s): BNP, PROBNP in the last 168 hours.   DDimer No results for input(s): DDIMER in the last 168 hours.   Radiology    No results found.  Cardiac Studies   Echocardiogram October 11, 2018 1. The left ventricle has moderately reduced systolic function of 09-32%. The cavity size is mildly increased. There is no left ventricular wall thickness. The left ventricular diastology could not be evaluated due to nondiagnostic images.  2. There is severe hypokinesis of the entire septal, anteroseptal and anterior left ventricular segments.  3. Mildly dilated left atrial size.  4. The mitral valve normal in structure. Regurgitation is mild by color flow Doppler.  5. Normal tricuspid valve.  6. Tricuspid regurgitation mild-moderate.  7. The aortic valve normal. There is moderate sclerosis of the aortic valve.  8. The right ventricle is mildly enlarged in size. There is normal systolic function. Right ventricular systolic pressure is mildly elevated with an estimated pressure of 42.4 mmHg. It would be busier if would stop stealing or consults  Patient Profile     Sadira Standard is a 73 y.o. female with a hx of  72y.o. female with  coronary artery disease,  h/o bypass surgery in 2004,  CLL,  diabetes,  below the knee amputation b/lfor PAD,  atrial fibrillation starting possibly in April 2016,  noted again June 2016,  acute on chronic diastolic CHF initially April 2016,  admitted to the hospital with diastolic CHF/leg swelling,  abdominal bloating, angina,  She has bilateral prosthesis She haspacerICD ablation for atrial fibrillation 07/18/2016 Recurrent atrial fibrillation when last seen in clinic September 2019, persistent since that time Presenting to the hospital with respiratory distress, urinary tract infection, acute on chronic renal  failure   Assessment & Plan    1. Atrial fibrillation, persistent INR has been subtherapeutic, and in light of this amiodarone held yesterday Amiodarone also likely contributing to her bradycardia Rates continues to run mid to low 50s.  She is relatively asymptomatic Asymptomatic from atrial fibrillation Fluid status will be managed with dialysis Should be a good candidate for NOAC ,  Eliquis  -Would continue to hold carvedilol if rate continues to run low. Carvedilol can be restarted at a later date as heart rate increases,  currently still with bradycardia  2) cardiomyopathy, ischemic Ejection fraction lower now 35 to 40%, 2018 was 40 to 45% Possibly depressed secondary to arrhythmia/atrial fibrillation Also underlying coronary disease, history of bypass Denies having anginal symptoms, no ischemic work-up Medications limited by low heart rate, renal failure ACE and arb held  3) PAD History of bilateral below the knee amputations  4) CAD with stable angina Pacer, ICD  5) acute bronchitis Presenting symptom now seems to be coming back again worse today, worsening sputum production, tightness in the chest, more Rales -Less likely from CHF given no leg swelling, abdominal bloating and she has finished 4 days of hemodialysis Discussed with Dr. Bridgett Larsson Could consider second course of broad-spectrum antibiotics  6) acute on chronic renal failure On hemodialysis, completed 4 days Scheduled again tomorrow Leg edema resolved   Total encounter time more than 25 minutes  Greater than 50% was spent in counseling and coordination of care with the patient    For questions or updates, please contact [facility] Please consult [email or web address] for contact info under        Signed, Ida Rogue, MD  10/27/2018, 4:09 PM

## 2018-10-27 NOTE — Progress Notes (Signed)
Pharmacy Antibiotic Note  Caroline Hernandez is a 73 y.o. female admitted on 10/16/2018 with pneumonia.  Pharmacy has been consulted for Levaquin dosing.  Plan: Levaquin 750mg  PO q48h  Height: 5\' 8"  (172.7 cm) Weight: 227 lb 9.6 oz (103.2 kg) IBW/kg (Calculated) : 63.9  Temp (24hrs), Avg:98.7 F (37.1 C), Min:98.4 F (36.9 C), Max:99 F (37.2 C)  Recent Labs  Lab 10/22/18 0433 10/23/18 0726 10/24/18 0613 10/25/18 0754 10/26/18 0332 10/26/18 1129 10/27/18 0501  WBC 36.1* 41.7* 34.6* 41.7* 44.5*  --  49.2*  CREATININE 4.19* 3.87*  --  2.06*  --  2.09* 2.03*    Estimated Creatinine Clearance: 32.4 mL/min (A) (by C-G formula based on SCr of 2.03 mg/dL (H)).    Allergies  Allergen Reactions  . Piperacillin Other (See Comments)    Renal failure  . 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 See ID note**PER PT-CAUSED RENAL FAILURE**  . Zosyn [Piperacillin Sod-Tazobactam So] Other (See Comments)    Renal failure    Thank you for allowing pharmacy to be a part of this patient's care.  Paulina Fusi, PharmD, BCPS 10/27/2018 3:41 PM

## 2018-10-27 NOTE — Progress Notes (Signed)
[facility]  ROUNDING NOTE   Subjective:   Daughter at bedside.   Hemodialysis treatment yesterday. Tolerated treatment well. Seated in Chair. UF of 3.5 liters  Patient complains of productive cough. She wants to stay one more day.   She is scheduled for outpatient peritoneal dialysis training for tomorrow.   Transitioned to apixaban.   Objective:  Vital signs in last 24 hours:  Temp:  [97.9 F (36.6 C)-99 F (37.2 C)] 98.4 F (36.9 C) (02/16 0804) Pulse Rate:  [46-56] 48 (02/16 0804) Resp:  [14-19] 16 (02/16 1027) BP: (121-152)/(47-113) 138/47 (02/16 0804) SpO2:  [93 %-100 %] 94 % (02/16 0804) Weight:  [103.2 kg-106.9 kg] 103.2 kg (02/16 0356)  Weight change: -3.902 kg Filed Weights   10/26/18 1406 10/26/18 1727 10/27/18 0356  Weight: 106.9 kg 103.8 kg 103.2 kg    Intake/Output: I/O last 3 completed shifts: In: 661.6 [I.V.:661.6] Out: 6294 [Urine:550; TMLYY:5035]   Intake/Output this shift:  No intake/output data recorded.  Physical Exam: General: No acute distress  Head: Normocephalic, atraumatic. Moist oral mucosal membranes  Eyes: Anicteric  Neck: Supple, trachea midline  Lungs:  Scattered rhonchi, normal effort  Heart: S1S2 no rubs  Abdomen:  Soft, nontender, bowel sounds present, PD catheter exit site with some blood, no erythema  Extremities: B/L BKA  Neurologic: Awake, alert, following commands  Skin: No lesions  Access:  RIJ permcath 2/12 Dr. Delana Meyer, PD catheter Dr. Delana Meyer 4/65    Basic Metabolic Panel: Recent Labs  Lab 10/22/18 0433 10/23/18 0726 10/25/18 0754 10/26/18 1129 10/27/18 0501  NA 132* 134* 139 136 136  K 4.0 4.4 3.5 3.8 3.6  CL 98 102 104 99 98  CO2 23 20* 30 31 31   GLUCOSE 209* 185* 57* 143* 134*  BUN 114* 117* 44* 28* 17  CREATININE 4.19* 3.87* 2.06* 2.09* 2.03*  CALCIUM 8.2* 8.3* 8.1* 7.7* 7.9*  MG 2.7*  --  2.3  --   --   PHOS 5.5*  --   --  2.7  --     Liver Function Tests: Recent Labs  Lab  10/22/18 0433 10/26/18 1129  ALBUMIN 3.6 3.2*   No results for input(s): LIPASE, AMYLASE in the last 168 hours. No results for input(s): AMMONIA in the last 168 hours.  CBC: Recent Labs  Lab 10/23/18 0726 10/24/18 0613 10/25/18 0754 10/26/18 0332 10/27/18 0501  WBC 41.7* 34.6* 41.7* 44.5* 49.2*  NEUTROABS  --  7.2  --   --   --   HGB 11.1* 10.7* 10.6* 10.2* 10.2*  HCT 37.0 34.7* 34.5* 34.2* 33.8*  MCV 85.1 82.0 82.5 83.6 84.3  PLT 133* 140* 146* 129* 123*    Cardiac Enzymes: No results for input(s): CKTOTAL, CKMB, CKMBINDEX, TROPONINI in the last 168 hours.  BNP: Invalid input(s): POCBNP  CBG: Recent Labs  Lab 10/26/18 1816 10/26/18 1855 10/26/18 2105 10/27/18 0429 10/27/18 0802  GLUCAP 58* 117* 130* 60* 132*    Microbiology: Results for orders placed or performed during the hospital encounter of 10/16/18  Respiratory Panel by PCR     Status: Abnormal   Collection Time: 10/16/18  4:05 AM  Result Value Ref Range Status   Adenovirus NOT DETECTED NOT DETECTED Final   Coronavirus 229E NOT DETECTED NOT DETECTED Final    Comment: (NOTE) The Coronavirus on the Respiratory Panel, DOES NOT test for the novel  Coronavirus (2019 nCoV)    Coronavirus HKU1 NOT DETECTED NOT DETECTED Final   Coronavirus NL63 NOT DETECTED  NOT DETECTED Final   Coronavirus OC43 NOT DETECTED NOT DETECTED Final   Metapneumovirus DETECTED (A) NOT DETECTED Final   Rhinovirus / Enterovirus NOT DETECTED NOT DETECTED Final   Influenza A NOT DETECTED NOT DETECTED Final   Influenza B NOT DETECTED NOT DETECTED Final   Parainfluenza Virus 1 NOT DETECTED NOT DETECTED Final   Parainfluenza Virus 2 NOT DETECTED NOT DETECTED Final   Parainfluenza Virus 3 NOT DETECTED NOT DETECTED Final   Parainfluenza Virus 4 NOT DETECTED NOT DETECTED Final   Respiratory Syncytial Virus NOT DETECTED NOT DETECTED Final   Bordetella pertussis NOT DETECTED NOT DETECTED Final   Chlamydophila pneumoniae NOT DETECTED NOT  DETECTED Final   Mycoplasma pneumoniae NOT DETECTED NOT DETECTED Final    Comment: Performed at [facility], [place] [address] [place], [place] [address]  Urine culture     Status: Abnormal   Collection Time: 10/16/18  4:23 AM  Result Value Ref Range Status   Specimen Description URINE, CLEAN CATCH  Final   Special Requests   Final    NONE Performed at [facility], [address] [place], [place] [address]    Culture >=100,000 COLONIES/mL KLEBSIELLA PNEUMONIAE (A)  Final   Report Status 10/19/2018 FINAL  Final   Organism ID, Bacteria KLEBSIELLA PNEUMONIAE (A)  Final      Susceptibility   Klebsiella pneumoniae - MIC*    AMPICILLIN RESISTANT Resistant     CEFAZOLIN <=4 SENSITIVE Sensitive     CEFTRIAXONE <=1 SENSITIVE Sensitive     CIPROFLOXACIN <=0.25 SENSITIVE Sensitive     GENTAMICIN <=1 SENSITIVE Sensitive     IMIPENEM <=0.25 SENSITIVE Sensitive     NITROFURANTOIN <=16 SENSITIVE Sensitive     TRIMETH/SULFA <=20 SENSITIVE Sensitive     AMPICILLIN/SULBACTAM 4 SENSITIVE Sensitive     PIP/TAZO <=4 SENSITIVE Sensitive     Extended ESBL NEGATIVE Sensitive     * >=100,000 COLONIES/mL KLEBSIELLA PNEUMONIAE  MRSA PCR Screening     Status: Abnormal   Collection Time: 10/16/18  5:13 PM  Result Value Ref Range Status   MRSA by PCR POSITIVE (A) NEGATIVE Final    Comment:        The GeneXpert MRSA Assay (FDA approved for NASAL specimens only), is one component of a comprehensive MRSA colonization surveillance program. It is not intended to diagnose MRSA infection nor to guide or monitor treatment for MRSA infections. RESULT CALLED TO, READ BACK BY AND VERIFIED WITH: MAI QUIARORO 10/16/18 @ 1838  [place] Performed at [facility], [address] [place], [place] [address]   CULTURE, BLOOD (ROUTINE X 2) w Reflex to ID Panel     Status: None (Preliminary result)   Collection Time: 10/24/18 12:11 AM  Result Value Ref Range Status   Specimen Description BLOOD  RIGHT ASSIST CONTROL  Final   Special Requests   Final    BOTTLES DRAWN AEROBIC AND ANAEROBIC Blood Culture adequate volume   Culture   Final    NO GROWTH 3 DAYS Performed at [facility], [address] [place], [place] [address]    Report Status PENDING  Incomplete  CULTURE, BLOOD (ROUTINE X 2) w Reflex to ID Panel     Status: None (Preliminary result)   Collection Time: 10/24/18 12:17 AM  Result Value Ref Range Status   Specimen Description BLOOD RIGHT HAND  Final   Special Requests   Final    BOTTLES DRAWN AEROBIC AND ANAEROBIC Blood Culture adequate volume  Culture   Final    NO GROWTH 3 DAYS Performed at [facility]: [address] [place], [place] [address]    Report Status PENDING  Incomplete    Coagulation Studies: Recent Labs    10/25/18 0754 10/26/18 1129 10/27/18 0501  LABPROT 14.5 13.9 14.7  INR 1.14 1.08 1.16    Urinalysis: No results for input(s): COLORURINE, LABSPEC, PHURINE, GLUCOSEU, HGBUR, BILIRUBINUR, KETONESUR, PROTEINUR, UROBILINOGEN, NITRITE, LEUKOCYTESUR in the last 72 hours.  Invalid input(s): APPERANCEUR    Imaging: No results found.   Medications:   . sodium chloride Stopped (10/22/18 0104)  . clindamycin (CLEOCIN) IV     . albuterol  2.5 mg Nebulization TID  . amitriptyline  25 mg Oral QHS  . amLODipine  10 mg Oral Daily  . apixaban  5 mg Oral BID  . atorvastatin  40 mg Oral QHS  . budesonide (PULMICORT) nebulizer solution  0.5 mg Nebulization BID  . Chlorhexidine Gluconate Cloth  6 each Topical Q0600  . feeding supplement (NEPRO CARB STEADY)  237 mL Oral BID BM  . gabapentin  100 mg Oral BID  . Gerhardt's butt cream   Topical BID  . insulin aspart  0-15 Units Subcutaneous TID WC  . insulin aspart  0-5 Units Subcutaneous QHS  . insulin aspart  5 Units Subcutaneous TID WC  . insulin glargine  15 Units Subcutaneous BID  . isosorbide mononitrate  30 mg Oral Daily  . lactulose  20 g Oral BID  . multivitamin   1 tablet Oral QHS  . pantoprazole  40 mg Oral QHS  . senna  1 tablet Oral Daily  . sertraline  100 mg Oral QHS  . sodium chloride flush  3 mL Intravenous Q12H  . torsemide  40 mg Oral Daily   sodium chloride, acetaminophen **OR** acetaminophen, guaiFENesin-dextromethorphan, hydrALAZINE, hydrOXYzine, ipratropium, nitroGLYCERIN, ondansetron **OR** ondansetron (ZOFRAN) IV, traMADol  Assessment/ Plan:  [name] is a 73 y.o. [place] female with atrial fibrillation, hypertension, diabetes mellitus type II insulin dependent, diabetic neuropathy, congestive heart failure, coronary artery disease status post CABG, GERD, CLL, bilateral below the knee amputations admitted with cough, shortness of breath, UTI and initiated on hemodialysis this admission.   1.  End Stage Renal Disease with hyponatremia and uremia.  First dialysis 2/12 through permcath. Completed four hemodialysis treatments this admission PD catheter placed by Dr. Delana Meyer of 2/14.  Outpatient planning for Peritoneal dialysis to be started on discharge at [place] to stay in place for back up hemodialysis as necessary.   2.  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    Thrombocytopenia   Hemoglobin 10.2 - No indication to start EPO  3.  Urinary tract infection. Klebsiella - cephalexin completed - Appreciate ID input  4. Diabetes mellitus type II with chronic kidney disease:insulin dependent. history of poor control.   5. Hypertension:  - amlodipine.   6. Atrial fibrillation: on amiodarone - transitioned to PO apixaban.    LOS: 11 Caroline Hernandez 2/16/202011:21 AM

## 2018-10-28 ENCOUNTER — Encounter: Payer: Self-pay | Admitting: Vascular Surgery

## 2018-10-28 LAB — CBC
HCT: 32.8 % — ABNORMAL LOW (ref 36.0–46.0)
Hemoglobin: 9.8 g/dL — ABNORMAL LOW (ref 12.0–15.0)
MCH: 25.3 pg — AB (ref 26.0–34.0)
MCHC: 29.9 g/dL — AB (ref 30.0–36.0)
MCV: 84.5 fL (ref 80.0–100.0)
Platelets: 121 10*3/uL — ABNORMAL LOW (ref 150–400)
RBC: 3.88 MIL/uL (ref 3.87–5.11)
RDW: 18.6 % — ABNORMAL HIGH (ref 11.5–15.5)
WBC: 48 10*3/uL — ABNORMAL HIGH (ref 4.0–10.5)
nRBC: 0 % (ref 0.0–0.2)

## 2018-10-28 LAB — GLUCOSE, CAPILLARY
Glucose-Capillary: 116 mg/dL — ABNORMAL HIGH (ref 70–99)
Glucose-Capillary: 170 mg/dL — ABNORMAL HIGH (ref 70–99)
Glucose-Capillary: 154 mg/dL — ABNORMAL HIGH (ref 70–99)
Glucose-Capillary: 132 mg/dL — ABNORMAL HIGH (ref 70–99)

## 2018-10-28 MED ORDER — LEVOFLOXACIN 500 MG PO TABS
500.0000 mg | ORAL_TABLET | ORAL | Status: DC
  Administered 2018-10-29: 500 mg via ORAL
  Filled 2018-10-28: qty 1

## 2018-10-28 NOTE — Progress Notes (Signed)
Pharmacy Antibiotic Note  Caroline Hernandez is a 73 y.o. female admitted on 10/16/2018 with pneumonia.  Pharmacy has been consulted for Levaquin dosing. Pt has been receiving HD inpt with plans for PD outpatient.   Plan: Current orders for Levaquin 750mg  PO q48h. Will adjust to HD/PD dosing of levaquin 500 mg PO q48h.   Height: 5\' 8"  (172.7 cm) Weight: 228 lb 8 oz (103.6 kg) IBW/kg (Calculated) : 63.9  Temp (24hrs), Avg:98.3 F (36.8 C), Min:98 F (36.7 C), Max:98.7 F (37.1 C)  Recent Labs  Lab 10/22/18 0433 10/23/18 0726 10/24/18 0613 10/25/18 0754 10/26/18 0332 10/26/18 1129 10/27/18 0501 10/28/18 0555  WBC 36.1* 41.7* 34.6* 41.7* 44.5*  --  49.2* 48.0*  CREATININE 4.19* 3.87*  --  2.06*  --  2.09* 2.03*  --     Estimated Creatinine Clearance: 32.5 mL/min (A) (by C-G formula based on SCr of 2.03 mg/dL (H)).    Allergies  Allergen Reactions  . Piperacillin Other (See Comments)    Renal failure  . 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 See ID note**PER PT-CAUSED RENAL FAILURE**  . Zosyn [Piperacillin Sod-Tazobactam So] Other (See Comments)    Renal failure   Levaquin 2/16 >>  Thank you for allowing pharmacy to be a part of this patient's care.  Rayna Sexton, PharmD, BCPS Clinical Pharmacist 10/28/2018 11:57 AM

## 2018-10-28 NOTE — Progress Notes (Signed)
O2 sats have dropped.  Put back on oxygen. 3L [place].  Lungs sound wet.  Suspect she will improve with dialysis today.  No c/o SOB.

## 2018-10-28 NOTE — Progress Notes (Signed)
The patient is off the floor for dialysis. Will see tomorrow.

## 2018-10-28 NOTE — Plan of Care (Signed)
  Problem: Clinical Measurements: Goal: Cardiovascular complication will be avoided Outcome: Progressing   

## 2018-10-28 NOTE — Progress Notes (Signed)
Post HD assessment. Pt tolerated tx well without c/o or complication.Net UF 2518, goal met.    10/28/18 1725  Vital Signs  Temp 97.9 F (36.6 C)  Temp Source Oral  Pulse Rate (!) 48  Pulse Rate Source Monitor  Resp 19  BP (!) 141/49  BP Location Right Arm  BP Method Automatic  Patient Position (if appropriate) Sitting  Oxygen Therapy  SpO2 100 %  O2 Device Nasal Cannula  O2 Flow Rate (L/min) 3 L/min  Dialysis Weight  Weight 101.8 kg  Type of Weight Post-Dialysis  Post-Hemodialysis Assessment  Rinseback Volume (mL) 250 mL  KECN 65.9 V  Dialyzer Clearance Lightly streaked  Duration of HD Treatment -hour(s) 3 hour(s)  Hemodialysis Intake (mL) 500 mL  UF Total -Machine (mL) 3018 mL  Net UF (mL) 2518 mL  Tolerated HD Treatment Yes  Education / Care Plan  Dialysis Education Provided Yes  Documented Education in Care Plan Yes

## 2018-10-28 NOTE — Progress Notes (Signed)
HD tx start    10/28/18 1411  Vital Signs  Pulse Rate (!) 54  Pulse Rate Source Monitor  Resp 17  BP (!) 152/52  BP Location Right Arm  BP Method Automatic  Patient Position (if appropriate) Sitting  Oxygen Therapy  SpO2 99 %  O2 Device Nasal Cannula  O2 Flow Rate (L/min) 3 L/min  During Hemodialysis Assessment  Blood Flow Rate (mL/min) 400 mL/min  Arterial Pressure (mmHg) -180 mmHg  Venous Pressure (mmHg) 150 mmHg  Transmembrane Pressure (mmHg) 50 mmHg  Ultrafiltration Rate (mL/min) 1000 mL/min  Dialysate Flow Rate (mL/min) 600 ml/min  Conductivity: Machine  13.9  HD Safety Checks Performed Yes  Dialysis Fluid Bolus Normal Saline  Bolus Amount (mL) 250 mL  Intra-Hemodialysis Comments Tx initiated

## 2018-10-28 NOTE — Progress Notes (Signed)
[facility]  ROUNDING NOTE   Subjective:    Patient complains of productive cough. She wants to stay one more day.  3500 cc of fluid removed on February 15   Objective:  Vital signs in last 24 hours:  Temp:  [98 F (36.7 C)-98.7 F (37.1 C)] 98.7 F (37.1 C) (02/17 0759) Pulse Rate:  [44-57] 51 (02/17 0804) Resp:  [18-22] 20 (02/17 0759) BP: (126-142)/(53-55) 126/55 (02/17 0759) SpO2:  [63 %-100 %] 90 % (02/17 0815) Weight:  [103.6 kg] 103.6 kg (02/17 0538)  Weight change: -1.452 kg Filed Weights   10/26/18 1727 10/27/18 0356 10/28/18 0538  Weight: 103.8 kg 103.2 kg 103.6 kg    Intake/Output: I/O last 3 completed shifts: In: 75.2 [I.V.:75.2] Out: 0    Intake/Output this shift:  No intake/output data recorded.  Physical Exam: General: No acute distress  Head: Normocephalic, atraumatic. Moist oral mucosal membranes  Eyes: Anicteric  Neck: Supple, trachea midline  Lungs:  Scattered rhonchi, normal effort, oxygen by nasal cannula  Heart: S1S2 no rubs  Abdomen:  Soft, nontender, bowel sounds present, PD catheter exit site with some blood, no erythema  Extremities: B/L BKA  Neurologic: Awake, alert, following commands  Skin: No lesions  Access:  RIJ permcath 2/12 Dr. Delana Meyer, PD catheter Dr. Delana Meyer 5/40    Basic Metabolic Panel: Recent Labs  Lab 10/22/18 0433 10/23/18 0726 10/25/18 0754 10/26/18 1129 10/27/18 0501  NA 132* 134* 139 136 136  K 4.0 4.4 3.5 3.8 3.6  CL 98 102 104 99 98  CO2 23 20* 30 31 31   GLUCOSE 209* 185* 57* 143* 134*  BUN 114* 117* 44* 28* 17  CREATININE 4.19* 3.87* 2.06* 2.09* 2.03*  CALCIUM 8.2* 8.3* 8.1* 7.7* 7.9*  MG 2.7*  --  2.3  --   --   PHOS 5.5*  --   --  2.7  --     Liver Function Tests: Recent Labs  Lab 10/22/18 0433 10/26/18 1129  ALBUMIN 3.6 3.2*   No results for input(s): LIPASE, AMYLASE in the last 168 hours. No results for input(s): AMMONIA in the last 168 hours.  CBC: Recent Labs  Lab  10/24/18 0613 10/25/18 0754 10/26/18 0332 10/27/18 0501 10/28/18 0555  WBC 34.6* 41.7* 44.5* 49.2* 48.0*  NEUTROABS 7.2  --   --   --   --   HGB 10.7* 10.6* 10.2* 10.2* 9.8*  HCT 34.7* 34.5* 34.2* 33.8* 32.8*  MCV 82.0 82.5 83.6 84.3 84.5  PLT 140* 146* 129* 123* 121*    Cardiac Enzymes: No results for input(s): CKTOTAL, CKMB, CKMBINDEX, TROPONINI in the last 168 hours.  BNP: Invalid input(s): POCBNP  CBG: Recent Labs  Lab 10/27/18 0802 10/27/18 1141 10/27/18 1705 10/27/18 2113 10/28/18 0911  GLUCAP 132* 186* 114* 114* 116*    Microbiology: Results for orders placed or performed during the hospital encounter of 10/16/18  Respiratory Panel by PCR     Status: Abnormal   Collection Time: 10/16/18  4:05 AM  Result Value Ref Range Status   Adenovirus NOT DETECTED NOT DETECTED Final   Coronavirus 229E NOT DETECTED NOT DETECTED Final    Comment: (NOTE) The Coronavirus on the Respiratory Panel, DOES NOT test for the novel  Coronavirus (2019 nCoV)    Coronavirus HKU1 NOT DETECTED NOT DETECTED Final   Coronavirus NL63 NOT DETECTED NOT DETECTED Final   Coronavirus OC43 NOT DETECTED NOT DETECTED Final   Metapneumovirus DETECTED (A) NOT DETECTED Final   Rhinovirus /  Enterovirus NOT DETECTED NOT DETECTED Final   Influenza A NOT DETECTED NOT DETECTED Final   Influenza B NOT DETECTED NOT DETECTED Final   Parainfluenza Virus 1 NOT DETECTED NOT DETECTED Final   Parainfluenza Virus 2 NOT DETECTED NOT DETECTED Final   Parainfluenza Virus 3 NOT DETECTED NOT DETECTED Final   Parainfluenza Virus 4 NOT DETECTED NOT DETECTED Final   Respiratory Syncytial Virus NOT DETECTED NOT DETECTED Final   Bordetella pertussis NOT DETECTED NOT DETECTED Final   Chlamydophila pneumoniae NOT DETECTED NOT DETECTED Final   Mycoplasma pneumoniae NOT DETECTED NOT DETECTED Final    Comment: Performed at [facility], [place] [address] [place], [place] [address]  Urine culture     Status: Abnormal    Collection Time: 10/16/18  4:23 AM  Result Value Ref Range Status   Specimen Description URINE, CLEAN CATCH  Final   Special Requests   Final    NONE Performed at [facility], [address] [place], [place] [address]    Culture >=100,000 COLONIES/mL KLEBSIELLA PNEUMONIAE (A)  Final   Report Status 10/19/2018 FINAL  Final   Organism ID, Bacteria KLEBSIELLA PNEUMONIAE (A)  Final      Susceptibility   Klebsiella pneumoniae - MIC*    AMPICILLIN RESISTANT Resistant     CEFAZOLIN <=4 SENSITIVE Sensitive     CEFTRIAXONE <=1 SENSITIVE Sensitive     CIPROFLOXACIN <=0.25 SENSITIVE Sensitive     GENTAMICIN <=1 SENSITIVE Sensitive     IMIPENEM <=0.25 SENSITIVE Sensitive     NITROFURANTOIN <=16 SENSITIVE Sensitive     TRIMETH/SULFA <=20 SENSITIVE Sensitive     AMPICILLIN/SULBACTAM 4 SENSITIVE Sensitive     PIP/TAZO <=4 SENSITIVE Sensitive     Extended ESBL NEGATIVE Sensitive     * >=100,000 COLONIES/mL KLEBSIELLA PNEUMONIAE  MRSA PCR Screening     Status: Abnormal   Collection Time: 10/16/18  5:13 PM  Result Value Ref Range Status   MRSA by PCR POSITIVE (A) NEGATIVE Final    Comment:        The GeneXpert MRSA Assay (FDA approved for NASAL specimens only), is one component of a comprehensive MRSA colonization surveillance program. It is not intended to diagnose MRSA infection nor to guide or monitor treatment for MRSA infections. RESULT CALLED TO, READ BACK BY AND VERIFIED WITH: MAI QUIARORO 10/16/18 @ 1838  [place] Performed at [facility], [address] [place], [place] [address]   CULTURE, BLOOD (ROUTINE X 2) w Reflex to ID Panel     Status: None (Preliminary result)   Collection Time: 10/24/18 12:11 AM  Result Value Ref Range Status   Specimen Description BLOOD RIGHT ASSIST CONTROL  Final   Special Requests   Final    BOTTLES DRAWN AEROBIC AND ANAEROBIC Blood Culture adequate volume   Culture   Final    NO GROWTH 4 DAYS Performed at [facility],  [address] [place], [place] [address]    Report Status PENDING  Incomplete  CULTURE, BLOOD (ROUTINE X 2) w Reflex to ID Panel     Status: None (Preliminary result)   Collection Time: 10/24/18 12:17 AM  Result Value Ref Range Status   Specimen Description BLOOD RIGHT HAND  Final   Special Requests   Final    BOTTLES DRAWN AEROBIC AND ANAEROBIC Blood Culture adequate volume   Culture   Final    NO GROWTH 4 DAYS Performed at [facility], [address], [place], [place] [address]  Report Status PENDING  Incomplete    Coagulation Studies: Recent Labs    10/26/18 1129 10/27/18 0501  LABPROT 13.9 14.7  INR 1.08 1.16    Urinalysis: No results for input(s): COLORURINE, LABSPEC, PHURINE, GLUCOSEU, HGBUR, BILIRUBINUR, KETONESUR, PROTEINUR, UROBILINOGEN, NITRITE, LEUKOCYTESUR in the last 72 hours.  Invalid input(s): APPERANCEUR    Imaging: Dg Chest Port 1 View  Result Date: 10/27/2018 CLINICAL DATA:  Cough EXAM: PORTABLE CHEST 1 VIEW COMPARISON:  10/24/2018 chest radiograph. FINDINGS: Stable configuration of right internal jugular central venous catheter, sternotomy wires and 2 lead left subclavian ICD. Stable cardiomediastinal silhouette with mild cardiomegaly. No pneumothorax. No pleural effusion. No overt pulmonary edema. Low lung volumes with mild bibasilar atelectasis. IMPRESSION: 1. Stable mild cardiomegaly without overt pulmonary edema. 2. Low lung volumes with mild bibasilar atelectasis. Electronically Signed   By: Ilona Sorrel M.D.   On: 10/27/2018 16:11     Medications:   . sodium chloride Stopped (10/22/18 0104)  . clindamycin (CLEOCIN) IV     . albuterol  2.5 mg Nebulization TID  . amitriptyline  25 mg Oral QHS  . amLODipine  10 mg Oral Daily  . apixaban  5 mg Oral BID  . atorvastatin  40 mg Oral QHS  . budesonide (PULMICORT) nebulizer solution  0.5 mg Nebulization BID  . Chlorhexidine Gluconate Cloth  6 each Topical Q0600  . feeding supplement  (NEPRO CARB STEADY)  237 mL Oral BID BM  . gabapentin  100 mg Oral BID  . Gerhardt's butt cream   Topical BID  . insulin aspart  0-15 Units Subcutaneous TID WC  . insulin aspart  0-5 Units Subcutaneous QHS  . insulin aspart  5 Units Subcutaneous TID WC  . insulin glargine  15 Units Subcutaneous BID  . isosorbide mononitrate  30 mg Oral Daily  . lactulose  20 g Oral BID  . [START ON 10/29/2018] levofloxacin  500 mg Oral Q48H  . multivitamin  1 tablet Oral QHS  . pantoprazole  40 mg Oral QHS  . senna  1 tablet Oral Daily  . sertraline  100 mg Oral QHS  . sodium chloride flush  3 mL Intravenous Q12H  . torsemide  40 mg Oral Daily   sodium chloride, acetaminophen **OR** acetaminophen, guaiFENesin-dextromethorphan, hydrALAZINE, hydrOXYzine, ipratropium, nitroGLYCERIN, ondansetron **OR** ondansetron (ZOFRAN) IV, traMADol  Assessment/ Plan:  Ms. Danyel Tobey is a 73 y.o. [place] female with atrial fibrillation, hypertension, diabetes mellitus type II insulin dependent, diabetic neuropathy, congestive heart failure, coronary artery disease status post CABG, GERD, CLL, bilateral below the knee amputations admitted with cough, shortness of breath, UTI and initiated on hemodialysis this admission.   1.  End Stage Renal Disease with hyponatremia and uremia.  First dialysis 2/12 through permcath. Completed four hemodialysis treatments this admission PD catheter placed by Dr. Delana Meyer of 2/14.  Outpatient planning for urgent start peritoneal dialysis to be started on discharge at [facility].  Tentatively scheduled for Wednesday. Permcath to stay in place for back up hemodialysis as necessary.  Hemodialysis, extra session today for ultrafiltration Continue torsemide  2.  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    Thrombocytopenia   Hemoglobin 9.8 -Follow closely  3.  Urinary tract infection. Klebsiella - cephalexin completed, now on levofloxacin  4. Diabetes mellitus type II  with chronic kidney disease:insulin dependent. history of poor control.   5. Hypertension:  - amlodipine.   6. Atrial fibrillation: on amiodarone - transitioned to PO apixaban.  LOS: 12 Caroline Hernandez 2/17/202010:52 AM

## 2018-10-28 NOTE — Progress Notes (Signed)
Returned from dialysis.  2.5 liters removed.  States her breathing is much easier.  O2 sats on room air 98%

## 2018-10-28 NOTE — Progress Notes (Signed)
Pre HD assessment    10/28/18 1404  Vital Signs  Temp 99.1 F (37.3 C)  Temp Source Oral  Pulse Rate (!) 52  Pulse Rate Source Monitor  Resp 17  BP (!) 152/60  BP Location Right Arm  BP Method Automatic  Patient Position (if appropriate) Sitting  Oxygen Therapy  SpO2 95 %  O2 Device Nasal Cannula  O2 Flow Rate (L/min) 3 L/min  Pain Assessment  Pain Scale 0-10  Pain Score 0  Dialysis Weight  Weight 103.6 kg  Type of Weight Pre-Dialysis  Time-Out for Hemodialysis  What Procedure? HD  Pt Identifiers(min of two) First/Last Name;MRN/Account#  Correct Site? Yes  Correct Side? Yes  Correct Procedure? Yes  Consents Verified? Yes  Rad Studies Available? N/A  Safety Precautions Reviewed? Yes  Engineer, civil (consulting) Number  ([id number])  Station Number 3  UF/Alarm Test Passed  Conductivity: Meter 13.8  Conductivity: Machine  13.9  pH 7.2  Reverse Osmosis main  Normal Saline Lot Number [id number]  Dialyzer Lot Number [id number]  Disposable Set Lot Number [id number]  Machine Temperature 98.6 F (37 C)  Musician and Audible Yes  Blood Lines Intact and Secured Yes  Pre Treatment Patient Checks  Vascular access used during treatment Catheter  Patient is receiving dialysis in a chair Yes  Hepatitis B Surface Antigen Results Negative  Date Hepatitis B Surface Antigen Drawn 10/22/18  Hepatitis B Surface Antibody  (<10)  Date Hepatitis B Surface Antibody Drawn 10/22/18  Hemodialysis Consent Verified Yes  Hemodialysis Standing Orders Initiated Yes  ECG (Telemetry) Monitor On Yes  Prime Ordered Normal Saline  Length of  DialysisTreatment -hour(s) 3 Hour(s)  Dialyzer Elisio 17H NR  Dialysate 3K, 2.5 Ca  Dialysis Anticoagulant None  Dialysate Flow Ordered 600  Blood Flow Rate Ordered 400 mL/min  Ultrafiltration Goal 2.5 Liters  Dialysis Blood Pressure Support Ordered Normal Saline  Education / Care Plan  Dialysis Education Provided Yes  Documented Education in Care Plan Yes

## 2018-10-28 NOTE — Progress Notes (Signed)
Pre HD assessment    10/28/18 1405  Neurological  Level of Consciousness Alert  Orientation Level Oriented X4  Respiratory  Respiratory Pattern Regular;Unlabored  Chest Assessment Chest expansion symmetrical  Cardiac  Pulse Regular  ECG Monitor Yes  Cardiac Rhythm Atrial fibrillation;SB  Vascular  R Radial Pulse +2  L Radial Pulse +2  Edema Generalized  Integumentary  Integumentary (WDL) X  Skin Color Appropriate for ethnicity  Musculoskeletal  Musculoskeletal (WDL) X  Generalized Weakness Yes  Assistive Device None  GU Assessment  Genitourinary (WDL) X  Genitourinary Symptoms  (HD)  Psychosocial  Psychosocial (WDL) WDL

## 2018-10-28 NOTE — Progress Notes (Signed)
Post HD assessment    10/28/18 1724  Neurological  Level of Consciousness Alert  Orientation Level Oriented X4  Respiratory  Respiratory Pattern Regular;Unlabored  Chest Assessment Chest expansion symmetrical  Cardiac  Pulse Regular  ECG Monitor Yes  Cardiac Rhythm Atrial fibrillation;SB  Vascular  R Radial Pulse +2  L Radial Pulse +2  Edema Generalized  Integumentary  Integumentary (WDL) X  Skin Color Appropriate for ethnicity  Musculoskeletal  Musculoskeletal (WDL) X  Generalized Weakness Yes  Assistive Device None  GU Assessment  Genitourinary (WDL) X  Genitourinary Symptoms  (HD)  Psychosocial  Psychosocial (WDL) WDL

## 2018-10-28 NOTE — Progress Notes (Signed)
[facility] [place] NAME: Caroline Hernandez    MR#:  [id number]  DATE OF BIRTH:  03-Dec-1945  SUBJECTIVE:  worsening cough with sputum & SOB, still on oxygen via nasal cannula at 2 L, waiting for HD today REVIEW OF SYSTEMS:    Review of Systems  Constitutional: Negative.   HENT: Negative.   Eyes: Negative.   Respiratory: Cough and shortness of breath. Cardiovascular: Negative.   Gastrointestinal: Negative.   Genitourinary: Negative.   Musculoskeletal: Negative.   Skin: Negative.   Neurological: Negative.   Endo/Heme/Allergies: Negative.   Psychiatric/Behavioral: Negative.   All other systems reviewed and are negative.  DRUG ALLERGIES:   Allergies  Allergen Reactions  . Piperacillin Other (See Comments)    Renal failure  . 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 See ID note**PER PT-CAUSED RENAL FAILURE**  . Zosyn [Piperacillin Sod-Tazobactam So] Other (See Comments)    Renal failure    VITALS:  Blood pressure (!) 162/57, pulse (!) 50, temperature 98.2 F (36.8 C), temperature source Oral, resp. rate 20, height 5\' 8"  (1.727 m), weight 101.8 kg, SpO2 98 %.  PHYSICAL EXAMINATION:   Physical Exam  GENERAL:  73 y.o.-year-old patient lying in the bed on oxygen via nasal cannula.  Obesity. Unable to speak in full sentences without shortness of breath EYES: Pupils equal, round, reactive to light and accommodation. No scleral icterus. Extraocular muscles intact.  HEENT: Head atraumatic, normocephalic. Oropharynx and nasopharynx clear.  NECK:  Supple, no jugular venous distention. No thyroid enlargement, no tenderness.  LUNGS: Bilateral basilar rales, no wheezing or rhonchi.  No use of accessory muscles to breathe. CARDIOVASCULAR: S1, S2 normal. No murmurs, rubs, or gallops.  ABDOMEN: Soft, nontender, nondistended. Bowel sounds present. No organomegaly or mass.  EXTREMITIES: No cyanosis, clubbing or  edema b/l.   Bilateral below-knee amputations NEUROLOGIC: Cranial nerves II through XII are intact. No focal Motor or sensory deficits b/l.   PSYCHIATRIC: The patient is alert and oriented x 3.  SKIN: No obvious rash, lesion, or ulcer.   LABORATORY PANEL:   CBC Recent Labs  Lab 10/28/18 0555  WBC 48.0*  HGB 9.8*  HCT 32.8*  PLT 121*   ------------------------------------------------------------------------------------------------------------------ Chemistries  Recent Labs  Lab 10/25/18 0754  10/27/18 0501  NA 139   < > 136  K 3.5   < > 3.6  CL 104   < > 98  CO2 30   < > 31  GLUCOSE 57*   < > 134*  BUN 44*   < > 17  CREATININE 2.06*   < > 2.03*  CALCIUM 8.1*   < > 7.9*  MG 2.3  --   --    < > = values in this interval not displayed.   ------------------------------------------------------------------------------------------------------------------  Cardiac Enzymes No results for input(s): TROPONINI in the last 168 hours. ------------------------------------------------------------------------------------------------------------------  RADIOLOGY:  Dg Chest Port 1 View  Result Date: 10/27/2018 CLINICAL DATA:  Cough EXAM: PORTABLE CHEST 1 VIEW COMPARISON:  10/24/2018 chest radiograph. FINDINGS: Stable configuration of right internal jugular central venous catheter, sternotomy wires and 2 lead left subclavian ICD. Stable cardiomediastinal silhouette with mild cardiomegaly. No pneumothorax. No pleural effusion. No overt pulmonary edema. Low lung volumes with mild bibasilar atelectasis. IMPRESSION: 1. Stable mild cardiomegaly without overt pulmonary edema. 2. Low lung volumes with mild bibasilar atelectasis. Electronically Signed   By: Ilona Sorrel M.D.   On: 10/27/2018 16:11  ASSESSMENT AND PLAN:   *Acute bronchitis improved - continue oral steroid. - Scheduled Nebulizers - Inhalers -Wean O2 as tolerated -Respiratory panel with Metapneumovirus. -Wean oxygen as  tolerated, NEB prn.  *Acute kidney injury over CKD stage III worsened No need for hemodialysis today per Dr. Juleen China. PD access catheter placed.  Plan PD at home.  *Acute respiratory failure due to acute on chronic systolic congestive heart failure, 35-40%.   Unable to wean off oxygen by nasal cannula, she need home oxygen 2 L by nasal cannula. DuoNeb as needed, Robitussin as needed. Continue hemodialysis per Nephro - session planned for today  *UTI with leukocytosis.  Klebsiella in urine cultures.  Completed oral Keflex antibiotic  *Chronic atrial fibrillation.  Discontinue amiodarone and Coreg due to bradycardia.  No cardioversion this time per Dr. Rockey Situ. Change to Eliquis p.o.  *Diabetes mellitus type 2.  On sliding scale insulin along with Lantus insulin Monitor blood sugars closely to avoid hypoglycemia  *Leukocytosis, history of CLL. No evidence of any sepsis. Possible due to reaction to steroid, not due to CLL per oncology consult.  Worsening leukocytosis.  Follow-up CBC.  Chest x-ray. May start levaquin per Dr. Rockey Situ.  Generalized weakness.  The patient wants to go home with home health.    All the records are reviewed and case discussed with Care Management/Social Worker Management plans discussed with the patient, nursing and they are in agreement.  CODE STATUS: Full code  DVT Prophylaxis: SCDs  TOTAL TIME TAKING CARE OF THIS PATIENT: 35 minutes.   POSSIBLE D/C IN 1-2 DAYS, DEPENDING ON CLINICAL CONDITION.  Max Sane M.D on 10/28/2018 at 7:14 PM  Between 7am to 6pm - Pager - [phone]  After 6pm go to [email or web address] - password [email or web address] [facility]  Office  [phone]  CC: Primary care physician; System, Provider Not In  Note: This dictation was prepared with Dragon dictation along with smaller phrase technology. Any transcriptional errors that result from this process are unintentional.

## 2018-10-28 NOTE — Progress Notes (Signed)
PT Cancellation Note  Patient Details Name: Caroline Hernandez MRN: [id number] DOB: [birth year]   Cancelled Treatment:    Reason Eval/Treat Not Completed: Patient at procedure or test/unavailable.  Pt currently off floor for dialysis.  Will continue to follow acutely.    Collie Siad PT, DPT 10/28/2018, 3:21 PM

## 2018-10-28 NOTE — Progress Notes (Deleted)
HD tx end    10/28/18 1717  Vital Signs  Pulse Rate (!) 51  Pulse Rate Source Monitor  Resp 15  BP (!) 160/67  BP Location Right Arm  BP Method Automatic  Patient Position (if appropriate) Sitting  Oxygen Therapy  SpO2 100 %  O2 Device Nasal Cannula  O2 Flow Rate (L/min) 3 L/min  During Hemodialysis Assessment  Dialysis Fluid Bolus Normal Saline  Bolus Amount (mL) 250 mL  Intra-Hemodialysis Comments Progressing as prescribed

## 2018-10-28 NOTE — Progress Notes (Signed)
HD tx end    10/28/18 1717  Vital Signs  Pulse Rate (!) 51  Pulse Rate Source Monitor  Resp 15  BP (!) 160/67  BP Location Right Arm  BP Method Automatic  Patient Position (if appropriate) Sitting  Oxygen Therapy  SpO2 100 %  O2 Device Nasal Cannula  O2 Flow Rate (L/min) 3 L/min  During Hemodialysis Assessment  Dialysis Fluid Bolus Normal Saline  Bolus Amount (mL) 250 mL  Intra-Hemodialysis Comments Progressing as prescribed;Tx completed

## 2018-10-29 ENCOUNTER — Inpatient Hospital Stay: Payer: Medicare HMO

## 2018-10-29 ENCOUNTER — Other Ambulatory Visit: Payer: Self-pay | Admitting: Cardiovascular Disease

## 2018-10-29 DIAGNOSIS — I482 Chronic atrial fibrillation, unspecified: Secondary | ICD-10-CM

## 2018-10-29 DIAGNOSIS — I5023 Acute on chronic systolic (congestive) heart failure: Secondary | ICD-10-CM

## 2018-10-29 DIAGNOSIS — D72829 Elevated white blood cell count, unspecified: Secondary | ICD-10-CM

## 2018-10-29 LAB — CULTURE, BLOOD (ROUTINE X 2)
Culture: NO GROWTH
Culture: NO GROWTH
SPECIAL REQUESTS: ADEQUATE
Special Requests: ADEQUATE

## 2018-10-29 LAB — CBC
HCT: 33.8 % — ABNORMAL LOW (ref 36.0–46.0)
Hemoglobin: 10.1 g/dL — ABNORMAL LOW (ref 12.0–15.0)
MCH: 25.3 pg — ABNORMAL LOW (ref 26.0–34.0)
MCHC: 29.9 g/dL — ABNORMAL LOW (ref 30.0–36.0)
MCV: 84.5 fL (ref 80.0–100.0)
Platelets: 128 10*3/uL — ABNORMAL LOW (ref 150–400)
RBC: 4 MIL/uL (ref 3.87–5.11)
RDW: 18.7 % — ABNORMAL HIGH (ref 11.5–15.5)
WBC: 48.8 10*3/uL — ABNORMAL HIGH (ref 4.0–10.5)
nRBC: 0 % (ref 0.0–0.2)

## 2018-10-29 LAB — GLUCOSE, CAPILLARY
GLUCOSE-CAPILLARY: 177 mg/dL — AB (ref 70–99)
GLUCOSE-CAPILLARY: 112 mg/dL — AB (ref 70–99)
Glucose-Capillary: 168 mg/dL — ABNORMAL HIGH (ref 70–99)
Glucose-Capillary: 156 mg/dL — ABNORMAL HIGH (ref 70–99)

## 2018-10-29 LAB — BASIC METABOLIC PANEL
Anion gap: 7 (ref 5–15)
BUN: 18 mg/dL (ref 8–23)
CO2: 27 mmol/L (ref 22–32)
Calcium: 8.1 mg/dL — ABNORMAL LOW (ref 8.9–10.3)
Chloride: 101 mmol/L (ref 98–111)
Creatinine, Ser: 3.01 mg/dL — ABNORMAL HIGH (ref 0.44–1.00)
GFR calc Af Amer: 17 mL/min — ABNORMAL LOW (ref >60)
GFR calc non Af Amer: 15 mL/min — ABNORMAL LOW (ref >60)
GLUCOSE: 179 mg/dL — AB (ref 70–99)
Potassium: 4.1 mmol/L (ref 3.5–5.1)
Sodium: 135 mmol/L (ref 135–145)

## 2018-10-29 MED ORDER — OCUVITE-LUTEIN PO CAPS
1.0000 | ORAL_CAPSULE | Freq: Every day | ORAL | Status: DC
  Administered 2018-10-30: 1 via ORAL
  Filled 2018-10-29 (×2): qty 1

## 2018-10-29 NOTE — Progress Notes (Signed)
Post HD Tx    10/29/18 1520  Hand-Off documentation  Report given to (Full Name) Georgeanna Lea, RN   Report received from (Full Name) Beatris Ship, RN   Vital Signs  Temp 98.1 F (36.7 C)  Temp Source Oral  Pulse Rate (!) 50  Pulse Rate Source Monitor  Resp 19  BP (!) 128/52  BP Location Left Arm  BP Method Automatic  Patient Position (if appropriate) Sitting  Oxygen Therapy  SpO2 100 %  O2 Device Nasal Cannula  O2 Flow Rate (L/min) 2 L/min  Pulse Oximetry Type Continuous  Pain Assessment  Pain Scale 0-10  Pain Score 0  Dialysis Weight  Weight  (unable to weigh pt in recliner )  Type of Weight Post-Dialysis  Post-Hemodialysis Assessment  Rinseback Volume (mL) 250 mL  KECN 57.2 V  Dialyzer Clearance Lightly streaked  Duration of HD Treatment -hour(s) 3 hour(s)  Hemodialysis Intake (mL) 500 mL  UF Total -Machine (mL) 2010 mL  Net UF (mL) 1510 mL  Tolerated HD Treatment Yes  Hemodialysis Catheter  Placement Date: 10/21/18    Site Condition No complications  Blue Lumen Status Heparin locked  Red Lumen Status Heparin locked  Post treatment catheter status Capped and Clamped

## 2018-10-29 NOTE — Progress Notes (Signed)
Pre HD Tx   10/29/18 1215  Vital Signs  Temp 97.8 F (36.6 C)  Temp Source Oral  Pulse Rate 78  Pulse Rate Source Monitor  Resp 19  BP (!) 145/57  BP Location Left Arm  BP Method Automatic  Patient Position (if appropriate) Sitting  Oxygen Therapy  SpO2 99 %  O2 Device Nasal Cannula  O2 Flow Rate (L/min) 2 L/min  Pulse Oximetry Type Continuous  Pain Assessment  Pain Scale 0-10  Pain Score 0  Dialysis Weight  Weight  (unable to weigh pt in dialysis recliner )  Type of Weight Pre-Dialysis  Time-Out for Hemodialysis  What Procedure? HD  Pt Identifiers(min of two) First/Last Name;MRN/Account#  Correct Site? Yes  Correct Side? Yes  Correct Procedure? Yes  Consents Verified? Yes  Rad Studies Available? N/A  Safety Precautions Reviewed? Yes  Engineer, civil (consulting) Number 1  Station Number 2  UF/Alarm Test Passed  Conductivity: Meter 14  Conductivity: Machine  14.1  pH 7.4  Reverse Osmosis Main  Normal Saline Lot Number [id number]  Dialyzer Lot Number [id number]  Disposable Set Lot Number [id number]  Machine Temperature 98.6 F (37 C)  Musician and Audible Yes  Blood Lines Intact and Secured Yes  Pre Treatment Patient Checks  Vascular access used during treatment Catheter  Patient is receiving dialysis in a chair Yes  Hepatitis B Surface Antigen Results Negative  Date Hepatitis B Surface Antigen Drawn 10/22/18  Hepatitis B Surface Antibody  (<10)  Date Hepatitis B Surface Antibody Drawn 10/22/18  Hemodialysis Consent Verified Yes  Hemodialysis Standing Orders Initiated Yes  ECG (Telemetry) Monitor On Yes  Prime Ordered Normal Saline  Length of  DialysisTreatment -hour(s) 3 Hour(s)  Dialysis Treatment Comments Na 140: Seated in chair   Dialyzer Elisio 17H NR  Dialysate 3K, 2.5 Ca  Dialysis Anticoagulant None  Dialysate Flow Ordered 600  Blood Flow Rate Ordered 400 mL/min  Ultrafiltration Goal 1.5 Liters  Dialysis Blood Pressure Support Ordered Normal  Saline  Education / Care Plan  Dialysis Education Provided Yes  Documented Education in Care Plan Yes  Hemodialysis Catheter  Placement Date: 10/21/18    Site Condition No complications  Blue Lumen Status Flushed  Red Lumen Status Flushed  Purple Lumen Status N/A  Dressing Status Clean;Dry;Intact  Interventions Dressing changed  Dressing Change Due 11/02/18

## 2018-10-29 NOTE — Progress Notes (Signed)
[facility]  ROUNDING NOTE   Subjective:   Patient underwent hemodialysis successfully yesterday.  2500 cc of fluid was removed Still feels somewhat short of breath, requiring oxygen  Objective:  Vital signs in last 24 hours:  Temp:  [97.8 F (36.6 C)-99.1 F (37.3 C)] 97.8 F (36.6 C) (02/18 0811) Pulse Rate:  [42-73] 47 (02/18 0811) Resp:  [15-20] 19 (02/18 0811) BP: (124-162)/(47-74) 149/56 (02/18 0811) SpO2:  [90 %-100 %] 97 % (02/18 0818) FiO2 (%):  [21 %] 21 % (02/17 2048) Weight:  [101.5 kg-103.6 kg] 101.5 kg (02/18 0359)  Weight change: -0.047 kg Filed Weights   10/28/18 1404 10/28/18 1725 10/29/18 0359  Weight: 103.6 kg 101.8 kg 101.5 kg    Intake/Output: I/O last 3 completed shifts: In: -  Out: 2578 [Urine:60; Other:2518]   Intake/Output this shift:  No intake/output data recorded.  Physical Exam: General: No acute distress  Head: Normocephalic, atraumatic. Moist oral mucosal membranes  Eyes: Anicteric  Neck: Supple, trachea midline  Lungs:  Scattered rhonchi, normal effort, oxygen by nasal cannula  Heart: S1S2 no rubs, 2/6 murmur,  Abdomen:  Soft, PD catheter in place  Extremities: B/L BKA  Neurologic: Awake, alert, following commands  Skin: No lesions  Access:  RIJ permcath 2/12 Dr. Delana Meyer, PD catheter Dr. Delana Meyer 4/27    Basic Metabolic Panel: Recent Labs  Lab 10/23/18 0726 10/25/18 0754 10/26/18 1129 10/27/18 0501 10/29/18 0540  NA 134* 139 136 136 135  K 4.4 3.5 3.8 3.6 4.1  CL 102 104 99 98 101  CO2 20* 30 31 31 27   GLUCOSE 185* 57* 143* 134* 179*  BUN 117* 44* 28* 17 18  CREATININE 3.87* 2.06* 2.09* 2.03* 3.01*  CALCIUM 8.3* 8.1* 7.7* 7.9* 8.1*  MG  --  2.3  --   --   --   PHOS  --   --  2.7  --   --     Liver Function Tests: Recent Labs  Lab 10/26/18 1129  ALBUMIN 3.2*   No results for input(s): LIPASE, AMYLASE in the last 168 hours. No results for input(s): AMMONIA in the last 168 hours.  CBC: Recent Labs   Lab 10/24/18 0613 10/25/18 0754 10/26/18 0332 10/27/18 0501 10/28/18 0555 10/29/18 0540  WBC 34.6* 41.7* 44.5* 49.2* 48.0* 48.8*  NEUTROABS 7.2  --   --   --   --   --   HGB 10.7* 10.6* 10.2* 10.2* 9.8* 10.1*  HCT 34.7* 34.5* 34.2* 33.8* 32.8* 33.8*  MCV 82.0 82.5 83.6 84.3 84.5 84.5  PLT 140* 146* 129* 123* 121* 128*    Cardiac Enzymes: No results for input(s): CKTOTAL, CKMB, CKMBINDEX, TROPONINI in the last 168 hours.  BNP: Invalid input(s): POCBNP  CBG: Recent Labs  Lab 10/28/18 0911 10/28/18 1211 10/28/18 1800 10/28/18 2109 10/29/18 0812  GLUCAP 116* 170* 154* 132* 168*    Microbiology: Results for orders placed or performed during the hospital encounter of 10/16/18  Respiratory Panel by PCR     Status: Abnormal   Collection Time: 10/16/18  4:05 AM  Result Value Ref Range Status   Adenovirus NOT DETECTED NOT DETECTED Final   Coronavirus 229E NOT DETECTED NOT DETECTED Final    Comment: (NOTE) The Coronavirus on the Respiratory Panel, DOES NOT test for the novel  Coronavirus (2019 nCoV)    Coronavirus HKU1 NOT DETECTED NOT DETECTED Final   Coronavirus NL63 NOT DETECTED NOT DETECTED Final   Coronavirus OC43 NOT DETECTED NOT DETECTED Final  Metapneumovirus DETECTED (A) NOT DETECTED Final   Rhinovirus / Enterovirus NOT DETECTED NOT DETECTED Final   Influenza A NOT DETECTED NOT DETECTED Final   Influenza B NOT DETECTED NOT DETECTED Final   Parainfluenza Virus 1 NOT DETECTED NOT DETECTED Final   Parainfluenza Virus 2 NOT DETECTED NOT DETECTED Final   Parainfluenza Virus 3 NOT DETECTED NOT DETECTED Final   Parainfluenza Virus 4 NOT DETECTED NOT DETECTED Final   Respiratory Syncytial Virus NOT DETECTED NOT DETECTED Final   Bordetella pertussis NOT DETECTED NOT DETECTED Final   Chlamydophila pneumoniae NOT DETECTED NOT DETECTED Final   Mycoplasma pneumoniae NOT DETECTED NOT DETECTED Final    Comment: Performed at [facility], [place] [address],  [place], [place] [address]  Urine culture     Status: Abnormal   Collection Time: 10/16/18  4:23 AM  Result Value Ref Range Status   Specimen Description URINE, CLEAN CATCH  Final   Special Requests   Final    NONE Performed at [facility], [address] [place], [place] [address]    Culture >=100,000 COLONIES/mL KLEBSIELLA PNEUMONIAE (A)  Final   Report Status 10/19/2018 FINAL  Final   Organism ID, Bacteria KLEBSIELLA PNEUMONIAE (A)  Final      Susceptibility   Klebsiella pneumoniae - MIC*    AMPICILLIN RESISTANT Resistant     CEFAZOLIN <=4 SENSITIVE Sensitive     CEFTRIAXONE <=1 SENSITIVE Sensitive     CIPROFLOXACIN <=0.25 SENSITIVE Sensitive     GENTAMICIN <=1 SENSITIVE Sensitive     IMIPENEM <=0.25 SENSITIVE Sensitive     NITROFURANTOIN <=16 SENSITIVE Sensitive     TRIMETH/SULFA <=20 SENSITIVE Sensitive     AMPICILLIN/SULBACTAM 4 SENSITIVE Sensitive     PIP/TAZO <=4 SENSITIVE Sensitive     Extended ESBL NEGATIVE Sensitive     * >=100,000 COLONIES/mL KLEBSIELLA PNEUMONIAE  MRSA PCR Screening     Status: Abnormal   Collection Time: 10/16/18  5:13 PM  Result Value Ref Range Status   MRSA by PCR POSITIVE (A) NEGATIVE Final    Comment:        The GeneXpert MRSA Assay (FDA approved for NASAL specimens only), is one component of a comprehensive MRSA colonization surveillance program. It is not intended to diagnose MRSA infection nor to guide or monitor treatment for MRSA infections. RESULT CALLED TO, READ BACK BY AND VERIFIED WITH: MAI QUIARORO 10/16/18 @ 1838  [place] Performed at [facility], [address] [place], [place] [address]   CULTURE, BLOOD (ROUTINE X 2) w Reflex to ID Panel     Status: None   Collection Time: 10/24/18 12:11 AM  Result Value Ref Range Status   Specimen Description BLOOD RIGHT ASSIST CONTROL  Final   Special Requests   Final    BOTTLES DRAWN AEROBIC AND ANAEROBIC Blood Culture adequate volume   Culture   Final    NO GROWTH 5  DAYS Performed at [facility], [address] [place], [place] [address]    Report Status 10/29/2018 FINAL  Final  CULTURE, BLOOD (ROUTINE X 2) w Reflex to ID Panel     Status: None   Collection Time: 10/24/18 12:17 AM  Result Value Ref Range Status   Specimen Description BLOOD RIGHT HAND  Final   Special Requests   Final    BOTTLES DRAWN AEROBIC AND ANAEROBIC Blood Culture adequate volume   Culture   Final    NO GROWTH 5 DAYS Performed at [facility],  [place], [place] [address]    Report Status 10/29/2018 FINAL  Final    Coagulation Studies: Recent Labs    10/26/18 1129 10/27/18 0501  LABPROT 13.9 14.7  INR 1.08 1.16    Urinalysis: No results for input(s): COLORURINE, LABSPEC, PHURINE, GLUCOSEU, HGBUR, BILIRUBINUR, KETONESUR, PROTEINUR, UROBILINOGEN, NITRITE, LEUKOCYTESUR in the last 72 hours.  Invalid input(s): APPERANCEUR    Imaging: Dg Chest Port 1 View  Result Date: 10/27/2018 CLINICAL DATA:  Cough EXAM: PORTABLE CHEST 1 VIEW COMPARISON:  10/24/2018 chest radiograph. FINDINGS: Stable configuration of right internal jugular central venous catheter, sternotomy wires and 2 lead left subclavian ICD. Stable cardiomediastinal silhouette with mild cardiomegaly. No pneumothorax. No pleural effusion. No overt pulmonary edema. Low lung volumes with mild bibasilar atelectasis. IMPRESSION: 1. Stable mild cardiomegaly without overt pulmonary edema. 2. Low lung volumes with mild bibasilar atelectasis. Electronically Signed   By: Ilona Sorrel M.D.   On: 10/27/2018 16:11     Medications:   . sodium chloride Stopped (10/22/18 0104)  . clindamycin (CLEOCIN) IV     . albuterol  2.5 mg Nebulization TID  . amitriptyline  25 mg Oral QHS  . amLODipine  10 mg Oral Daily  . apixaban  5 mg Oral BID  . atorvastatin  40 mg Oral QHS  . budesonide (PULMICORT) nebulizer solution  0.5 mg Nebulization BID  . Chlorhexidine Gluconate Cloth  6 each Topical  Q0600  . feeding supplement (NEPRO CARB STEADY)  237 mL Oral BID BM  . gabapentin  100 mg Oral BID  . Gerhardt's butt cream   Topical BID  . insulin aspart  0-15 Units Subcutaneous TID WC  . insulin aspart  0-5 Units Subcutaneous QHS  . insulin aspart  5 Units Subcutaneous TID WC  . insulin glargine  15 Units Subcutaneous BID  . isosorbide mononitrate  30 mg Oral Daily  . lactulose  20 g Oral BID  . levofloxacin  500 mg Oral Q48H  . multivitamin  1 tablet Oral QHS  . pantoprazole  40 mg Oral QHS  . senna  1 tablet Oral Daily  . sertraline  100 mg Oral QHS  . sodium chloride flush  3 mL Intravenous Q12H  . torsemide  40 mg Oral Daily   sodium chloride, acetaminophen **OR** acetaminophen, guaiFENesin-dextromethorphan, hydrALAZINE, hydrOXYzine, ipratropium, nitroGLYCERIN, ondansetron **OR** ondansetron (ZOFRAN) IV, traMADol  Assessment/ Plan:  [name] is a 73 y.o. [place] female with atrial fibrillation, hypertension, diabetes mellitus type II insulin dependent, diabetic neuropathy, congestive heart failure, coronary artery disease status post CABG, GERD, CLL, bilateral below the knee amputations admitted with cough, shortness of breath, UTI and initiated on hemodialysis this admission.   1.  End Stage Renal Disease with hyponatremia and uremia.  First dialysis 2/12 through permcath. Completed four hemodialysis treatments this admission PD catheter placed by Dr. Delana Meyer of 2/14.  Outpatient planning for urgent start peritoneal dialysis to be started on discharge at [facility].   Tentatively scheduled for Thursday. Permcath to stay in place for back up hemodialysis as necessary.  Extra hemodialysis session today for ultrafiltration Continue torsemide Chest x-ray post hemodialysis  2.  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    Thrombocytopenia   Hemoglobin 10.1 -Follow closely  3.  Urinary tract infection. Klebsiella - cephalexin completed, now on  levofloxacin  4. Diabetes mellitus type II with chronic kidney disease:insulin dependent. history of poor control.   5. Hypertension with chronic kidney disease:  -  amlodipine.   6. Atrial fibrillation: on amiodarone - transitioned to PO apixaban.    LOS: [place] 2/18/202011:24 AM

## 2018-10-29 NOTE — Progress Notes (Signed)
Physical Therapy Treatment Patient Details Name: Caroline Hernandez MRN: [id number] DOB: [birth year] Today's Date: 10/29/2018    History of Present Illness 73 yo female with onset of CHF and 20# wgt gain was admitted to diurese, and noted elevated troponin and had foul smelling urine.  Nonproductive cough, congestion, UTI, ABT started.  Permcath placed 2/11 by vascular. PD cath placed 2/14. Pt with bradycardia. PMHx:  B BK amputation, CAD, MI, CHF, CKD, CLL, hiatal hernia, ischemic cardiomyopathy, a-fib    PT Comments    Pt was limited this session due to HR. Pt on 2L O2 throughout session, SpO2 remained at or above 96%. SPT min guard for all aspects of mobility. In supine pt reported 4/10 dizziness BP 156/55, HR 60. Pt performed exercises in supine and HR responded appropriately by increasing up to 68. Pt sitting at EOB and tele reported non sustain VT for 5 min. Pt still 4/10 dizziness BP 143/67 and HR 60-68. Tele showed a pair of PVCs and HR dropped to 47 pt instructed to get into supine. In supine HR 56. RN notified. Current plan remains appropriate.    Follow Up Recommendations  SNF     Equipment Recommendations  Other (comment)(TBD at next venue )    Recommendations for Other Services       Precautions / Restrictions Precautions Precautions: Fall Precaution Comments: HR, O2 Restrictions Weight Bearing Restrictions: No    Mobility  Bed Mobility Overal bed mobility: Needs Assistance Bed Mobility: Supine to Sit;Sit to Supine     Supine to sit: Min guard Sit to supine: Min guard   General bed mobility comments: initially with sup>sit pt requested assistance of her duaghter to get up but SPT encouraged to do what she could on her own and was able to go sup<>sit with min guard.   Transfers                 General transfer comment: unable to perform due to HR   Ambulation/Gait                 Stairs             Wheelchair Mobility    Modified Rankin  (Stroke Patients Only)       Balance Overall balance assessment: Needs assistance Sitting-balance support: Single extremity supported Sitting balance-Leahy Scale: Fair Sitting balance - Comments: Pt intermittently used single UE support in sitting to maintain balance.  If challenged pt would lose balance.                                     Cognition Arousal/Alertness: Awake/alert Behavior During Therapy: WFL for tasks assessed/performed Overall Cognitive Status: Within Functional Limits for tasks assessed                                        Exercises General Exercises - Lower Extremity Hip ABduction/ADduction: Strengthening;Both;10 reps;Supine Straight Leg Raises: Strengthening;Both;10 reps;Supine    General Comments General comments (skin integrity, edema, etc.): Pt on 2L O2 throughout session SpO2 remained at or above 96%. In supine pt reported 4/10 dizziness BP 156/55, HR 60. Pt performed exercises and HR responded appropriately up to 68. Pt sitting at EOB and tele reported non sustain VT for 5 min. Pt still 4/10 dizziness BP 143/67 and HR 60-68. Tele showed  a pair of PVCs and HR dropped to 47 pt instructed to get into supine. In supine HR 56. RN notified.       Pertinent Vitals/Pain Pain Assessment: No/denies pain    Home Living                      Prior Function            PT Goals (current goals can now be found in the care plan section) Acute Rehab PT Goals PT Goal Formulation: With patient Time For Goal Achievement: 11/02/18 Potential to Achieve Goals: Good Progress towards PT goals: Not progressing toward goals - comment(due to HR )    Frequency    Min 2X/week      PT Plan Current plan remains appropriate    Co-evaluation              AM-PAC PT "6 Clicks" Mobility   Outcome Measure  Help needed turning from your back to your side while in a flat bed without using bedrails?: A Little Help needed  moving from lying on your back to sitting on the side of a flat bed without using bedrails?: A Little Help needed moving to and from a bed to a chair (including a wheelchair)?: A Lot Help needed standing up from a chair using your arms (e.g., wheelchair or bedside chair)?: A Lot Help needed to walk in hospital room?: A Lot Help needed climbing 3-5 steps with a railing? : A Lot 6 Click Score: 14    End of Session Equipment Utilized During Treatment: Oxygen Activity Tolerance: Treatment limited secondary to medical complications (Comment)(HR, dizziness) Patient left: in bed;with call bell/phone within reach;with bed alarm set;with family/visitor present Nurse Communication: Mobility status;Other (comment)(HR, dizziness ) PT Visit Diagnosis: Unsteadiness on feet (R26.81);Other abnormalities of gait and mobility (R26.89);Difficulty in walking, not elsewhere classified (R26.2)     Time: 8676-1950 PT Time Calculation (min) (ACUTE ONLY): 24 min  Charges:  $Therapeutic Activity: 23-37 mins                      Dorothy Spark, SPT  10/29/2018, 11:47 AM

## 2018-10-29 NOTE — Telephone Encounter (Signed)
Please review for refill.  

## 2018-10-29 NOTE — Progress Notes (Signed)
[facility] [place] NAME: Caroline Hernandez    MR#:  [id number]  DATE OF BIRTH:  [birth year]  SUBJECTIVE:  Orthopnea +, not able to lay flat. Daughter at bedside REVIEW OF SYSTEMS:    Review of Systems  Constitutional: Negative.   HENT: Negative.   Eyes: Negative.   Respiratory: Cough and shortness of breath. Cardiovascular: Negative.   Gastrointestinal: Negative.   Genitourinary: Negative.   Musculoskeletal: Negative.   Skin: Negative.   Neurological: Negative.   Endo/Heme/Allergies: Negative.   Psychiatric/Behavioral: Negative.   All other systems reviewed and are negative.  DRUG ALLERGIES:   Allergies  Allergen Reactions  . Piperacillin Other (See Comments)    Renal failure  . 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 See ID note**PER PT-CAUSED RENAL FAILURE**  . Zosyn [Piperacillin Sod-Tazobactam So] Other (See Comments)    Renal failure    VITALS:  Blood pressure (!) 119/49, pulse (!) 52, temperature 97.8 F (36.6 C), temperature source Oral, resp. rate 20, height 5\' 8"  (1.727 m), weight 101.5 kg, SpO2 97 %.  PHYSICAL EXAMINATION:   Physical Exam  GENERAL:  73 y.o.-year-old patient lying in the bed on oxygen via nasal cannula.  Obesity. Unable to speak in full sentences without shortness of breath EYES: Pupils equal, round, reactive to light and accommodation. No scleral icterus. Extraocular muscles intact.  HEENT: Head atraumatic, normocephalic. Oropharynx and nasopharynx clear.  NECK:  Supple, no jugular venous distention. No thyroid enlargement, no tenderness.  LUNGS: Bilateral basilar rales, no wheezing or rhonchi.  No use of accessory muscles to breathe. CARDIOVASCULAR: S1, S2 normal. No murmurs, rubs, or gallops.  ABDOMEN: Soft, nontender, nondistended. Bowel sounds present. No organomegaly or mass.  EXTREMITIES: No cyanosis, clubbing or edema b/l.   Bilateral below-knee  amputations NEUROLOGIC: Cranial nerves II through XII are intact. No focal Motor or sensory deficits b/l.   PSYCHIATRIC: The patient is alert and oriented x 3.  SKIN: No obvious rash, lesion, or ulcer.   LABORATORY PANEL:   CBC Recent Labs  Lab 10/29/18 0540  WBC 48.8*  HGB 10.1*  HCT 33.8*  PLT 128*   ------------------------------------------------------------------------------------------------------------------ Chemistries  Recent Labs  Lab 10/25/18 0754  10/29/18 0540  NA 139   < > 135  K 3.5   < > 4.1  CL 104   < > 101  CO2 30   < > 27  GLUCOSE 57*   < > 179*  BUN 44*   < > 18  CREATININE 2.06*   < > 3.01*  CALCIUM 8.1*   < > 8.1*  MG 2.3  --   --    < > = values in this interval not displayed.   ------------------------------------------------------------------------------------------------------------------  Cardiac Enzymes No results for input(s): TROPONINI in the last 168 hours. ------------------------------------------------------------------------------------------------------------------  RADIOLOGY:  Dg Chest Port 1 View  Result Date: 10/27/2018 CLINICAL DATA:  Cough EXAM: PORTABLE CHEST 1 VIEW COMPARISON:  10/24/2018 chest radiograph. FINDINGS: Stable configuration of right internal jugular central venous catheter, sternotomy wires and 2 lead left subclavian ICD. Stable cardiomediastinal silhouette with mild cardiomegaly. No pneumothorax. No pleural effusion. No overt pulmonary edema. Low lung volumes with mild bibasilar atelectasis. IMPRESSION: 1. Stable mild cardiomegaly without overt pulmonary edema. 2. Low lung volumes with mild bibasilar atelectasis. Electronically Signed   By: Ilona Sorrel M.D.   On: 10/27/2018 16:11   ASSESSMENT AND PLAN:   *Acute bronchitis improved -  continue oral steroid. - Scheduled Nebulizers - Inhalers -Wean O2 as tolerated -Respiratory panel with Metapneumovirus. -Wean oxygen as tolerated, NEB prn.  *Acute kidney  injury over CKD stage III  - hemodialysis today per Dr. Candiss Norse  *Acute respiratory failure due to acute on chronic systolic congestive heart failure, 35-40%.   Unable to wean off oxygen by nasal cannula, she need home oxygen 2 L by nasal cannula. DuoNeb as needed, Robitussin as needed. Continue hemodialysis per Nephro - another session planned for today   *UTI with leukocytosis.  Klebsiella in urine cultures.  Completed oral Keflex antibiotic  *Chronic atrial fibrillation.  Discontinue amiodarone and Coreg due to bradycardia.  No cardioversion this time per Dr. Rockey Situ. - continue Eliquis  *Diabetes mellitus type 2.  On sliding scale insulin along with Lantus insulin Monitor blood sugars closely to avoid hypoglycemia  *Leukocytosis, history of CLL. No evidence of any sepsis. Possible due to reaction to steroid, not due to CLL per oncology consult.  Worsening leukocytosis.  Follow-up CBC.  Chest x-ray. May start levaquin per Dr. Rockey Situ.  Generalized weakness.  The patient wants to go home with home health. PT recommends STR/SNF    All the records are reviewed and case discussed with Care Management/Social Worker Management plans discussed with the patient, nursing and they are in agreement.  CODE STATUS: Full code  DVT Prophylaxis: SCDs  TOTAL TIME TAKING CARE OF THIS PATIENT: 35 minutes.   POSSIBLE D/C IN 1-2 DAYS, DEPENDING ON CLINICAL CONDITION.  Max Sane M.D on 10/29/2018 at 1:35 PM  Between 7am to 6pm - Pager - [phone]  After 6pm go to [email or web address] - password [email or web address] [facility]  Office  [phone]  CC: Primary care physician; System, Provider Not In  Note: This dictation was prepared with Dragon dictation along with smaller phrase technology. Any transcriptional errors that result from this process are unintentional.

## 2018-10-29 NOTE — Progress Notes (Signed)
Progress Note  Patient Name: Caroline Hernandez Date of Encounter: 10/29/2018  Primary Cardiologist: Ida Rogue, MD   Subjective   Patient denied chest pain or racing heart rate.  She did report at most 2 palpitations daily.  She reported improving above the knee bilateral leg edema.  She stated that "this part of my leg was hard as a rock when I first came into the ED, but it is back to normal."  She feels as if her breathing is much better.  She reportedly does not use oxygen at home.  She stated that she has plenty of ways to ambulate at home including a walker and scooter.  Inpatient Medications    Scheduled Meds: . albuterol  2.5 mg Nebulization TID  . amitriptyline  25 mg Oral QHS  . amLODipine  10 mg Oral Daily  . apixaban  5 mg Oral BID  . atorvastatin  40 mg Oral QHS  . budesonide (PULMICORT) nebulizer solution  0.5 mg Nebulization BID  . Chlorhexidine Gluconate Cloth  6 each Topical Q0600  . feeding supplement (NEPRO CARB STEADY)  237 mL Oral BID BM  . gabapentin  100 mg Oral BID  . Gerhardt's butt cream   Topical BID  . insulin aspart  0-15 Units Subcutaneous TID WC  . insulin aspart  0-5 Units Subcutaneous QHS  . insulin aspart  5 Units Subcutaneous TID WC  . insulin glargine  15 Units Subcutaneous BID  . isosorbide mononitrate  30 mg Oral Daily  . lactulose  20 g Oral BID  . levofloxacin  500 mg Oral Q48H  . multivitamin  1 tablet Oral QHS  . [START ON 10/30/2018] multivitamin-lutein  1 capsule Oral Daily  . pantoprazole  40 mg Oral QHS  . senna  1 tablet Oral Daily  . sertraline  100 mg Oral QHS  . sodium chloride flush  3 mL Intravenous Q12H  . torsemide  40 mg Oral Daily   Continuous Infusions: . sodium chloride Stopped (10/22/18 0104)  . clindamycin (CLEOCIN) IV     PRN Meds: sodium chloride, acetaminophen **OR** acetaminophen, guaiFENesin-dextromethorphan, hydrALAZINE, hydrOXYzine, ipratropium, nitroGLYCERIN, ondansetron **OR** ondansetron (ZOFRAN) IV,  traMADol   Vital Signs    Vitals:   10/29/18 1345 10/29/18 1400 10/29/18 1415 10/29/18 1430  BP: (!) 125/48 (!) 132/51 (!) 126/56 (!) 131/58  Pulse: (!) 48 (!) 51 (!) 44 (!) 46  Resp: 20 17 17 16   Temp:      TempSrc:      SpO2: 97% 96% 99% 97%  Weight:      Height:        Intake/Output Summary (Last 24 hours) at 10/29/2018 1448 Last data filed at 10/29/2018 0200 Gross per 24 hour  Intake -  Output 2578 ml  Net -2578 ml   Filed Weights   10/28/18 1404 10/28/18 1725 10/29/18 0359  Weight: 103.6 kg 101.8 kg 101.5 kg    Telemetry    Not on telemetry. HD monitor shows IRIR, high 40s - Personally Reviewed  ECG    No new tracings - Personally Reviewed  Physical Exam   GEN: No acute distress.  Receiving HD Cardiac: IRIR, no murmurs, rubs, or gallops.  Respiratory: Bilateral rhonchi and wheezing, poor air flow / cough GI: Soft, nontender, non-distended  MS: Bilateral below the knee amputations without above the knee bilateral leg edema; Bilateral prosthesis in place Neuro:  Nonfocal  Psych: Normal affect   Labs    Chemistry Recent Labs  Lab  10/26/18 1129 10/27/18 0501 10/29/18 0540  NA 136 136 135  K 3.8 3.6 4.1  CL 99 98 101  CO2 31 31 27   GLUCOSE 143* 134* 179*  BUN 28* 17 18  CREATININE 2.09* 2.03* 3.01*  CALCIUM 7.7* 7.9* 8.1*  ALBUMIN 3.2*  --   --   GFRNONAA 23* 24* 15*  GFRAA 27* 28* 17*  ANIONGAP 6 7 7      Hematology Recent Labs  Lab 10/27/18 0501 10/28/18 0555 10/29/18 0540  WBC 49.2* 48.0* 48.8*  RBC 4.01 3.88 4.00  HGB 10.2* 9.8* 10.1*  HCT 33.8* 32.8* 33.8*  MCV 84.3 84.5 84.5  MCH 25.4* 25.3* 25.3*  MCHC 30.2 29.9* 29.9*  RDW 18.5* 18.6* 18.7*  PLT 123* 121* 128*    Cardiac EnzymesNo results for input(s): TROPONINI in the last 168 hours. No results for input(s): TROPIPOC in the last 168 hours.   BNPNo results for input(s): BNP, PROBNP in the last 168 hours.   DDimer No results for input(s): DDIMER in the last 168 hours.    Radiology    Dg Chest Port 1 View  Result Date: 10/29/2018 CLINICAL DATA:  Increased shortness of breath; history coronary artery disease, CHF, stage IV chronic kidney disease, CLL, diabetes mellitus, hypertension, ischemic cardiomyopathy, persistent atrial fibrillation EXAM: PORTABLE CHEST 1 VIEW COMPARISON:  Portable exam 1151 hours compared to 10/27/2018 FINDINGS: RIGHT jugular dual-lumen central venous catheter with tip projecting over cavoatrial junction. LEFT subclavian AICD with leads projecting over RIGHT ventricle. Enlargement of cardiac silhouette with pulmonary vascular congestion post median sternotomy. Atherosclerotic calcifications aorta. Minimal LEFT basilar atelectasis. Lungs otherwise clear. No pleural effusion or pneumothorax. IMPRESSION: Minimal LEFT basilar atelectasis. Electronically Signed   By: Lavonia Dana M.D.   On: 10/29/2018 13:46    Cardiac Studies   TTE 10/11/2018 1. The left ventricle has moderately reduced systolic function of 17-40%. The cavity size is mildly increased. There is no left ventricular wall thickness. The left ventricular diastology could not be evaluated due to nondiagnostic images.  2. There is severe hypokinesis of the entire septal, anteroseptal and anterior left ventricular segments.  3. Mildly dilated left atrial size.  4. The mitral valve normal in structure. Regurgitation is mild by color flow Doppler.  5. Normal tricuspid valve.  6. Tricuspid regurgitation mild-moderate.  7. The aortic valve normal. There is moderate sclerosis of the aortic valve.  8. The right ventricle is mildly enlarged in size. There is normal systolic function. Right ventricular systolic pressure is mildly elevated with an estimated pressure of 42.4 mmHg.  Patient Profile     73 y.o. female with a history of CAD s/p bypass surgery 2004, CLL, diabetes, b/l below the knee amputation for PAD, atrial fibrillation, ICD, ablation, acute on chronic HFrEF who presented to the  hospital in respiratory distress with urinary tract infection and acute on chronic renal failure.  Assessment & Plan    Persistent atrial fibrillation  - Relatively asymptomatic in A. fib. Ablation in 2017 with recurrence of Afib 05/2018 and persistent since that time. ICD/pacer. - Currently bradycardic rates with amiodarone and Coreg held.  Continue to hold due to continued bradycardic rates. No rate control needed d/t bradycardia.  - No plan for cardioversion. Continue anticoagulation with Eliquis 5mg  bid. - Recommend EP/ outpatient follow-up.  Ischemic cardiomyopathy / Acute on chronic HFrEF -Volume status improved since admission. EF reduced from 2018 of 40-45% to now 35 to 40% and possibly 2/2 Afib.   Will possibly need home  oxygen per IM documentation. Volume status management via HD with patient reporting improved/resolved bilateral above the knee swelling, consistent with physical exam. SOB and chest tightness resolving with HD and abx. Also on torsemide 40mg  daily for diuresis. No ACE/ARB/ARNI d/t renal function.   CAD - No chest pain. Underlying CAD with history of bypass 2004. Pacer, ICD. No ischemic workup planned for this admission. Guideline directed therapy limited by bradycardic rates and renal failure. BB/ACE/ARB held. Continue Imdur 30mg  daily, statin therapy with atorvastatin 40mg  daily, amlodipine 10mg  daily. Recommend EP / outpatient follow-up.  PAD - S/p bilateral below knee amputations.  Acute bronchitis - SOB, chest tightness, and sputum production have been improving this admission per patient and with HD and abx. Patient does not previously have home oxygen and will need home oxygen arranged if not able to wean off [place] this admission.   Acute on chronic renal failure - Continue HD today. Plan for PD at home. Per nephrology.    For questions or updates, please contact [place] Please consult [email or web address] for contact info under        Signed, Arvil Chaco, PA-C  10/29/2018, 2:48 PM

## 2018-10-29 NOTE — Progress Notes (Signed)
HD Tx completed    10/29/18 1515  Vital Signs  Pulse Rate (!) 52  Resp 20  BP (!) 132/45  Oxygen Therapy  SpO2 100 %  O2 Device Nasal Cannula  O2 Flow Rate (L/min) 2 L/min  Pulse Oximetry Type Continuous  During Hemodialysis Assessment  HD Safety Checks Performed Yes  KECN 57.2 KECN  Dialysis Fluid Bolus Normal Saline  Bolus Amount (mL) 250 mL  Intra-Hemodialysis Comments Tx completed;Tolerated well  Post-Hemodialysis Assessment  Rinseback Volume (mL) 250 mL  KECN 57.2 V  Dialyzer Clearance Lightly streaked  Duration of HD Treatment -hour(s) 3 hour(s)  Hemodialysis Intake (mL) 500 mL  UF Total -Machine (mL) 2010 mL  Net UF (mL) 1510 mL  Tolerated HD Treatment Yes  Hemodialysis Catheter  Placement Date: 10/21/18    Site Condition No complications  Blue Lumen Status Heparin locked  Red Lumen Status Heparin locked  Post treatment catheter status Capped and Clamped

## 2018-10-29 NOTE — Progress Notes (Signed)
Pre HD Assessment    10/29/18 1215  Neurological  Level of Consciousness Alert  Orientation Level Oriented X4  Respiratory  Respiratory Pattern Regular  Chest Assessment Chest expansion symmetrical  Bilateral Breath Sounds Diminished;Expiratory wheezes  Cough Dry  Cardiac  Pulse Regular  Heart Sounds S1, S2  Jugular Venous Distention (JVD) No  ECG Monitor Yes  Cardiac Rhythm Atrial fibrillation  Vascular  R Radial Pulse +2  L Radial Pulse +2  Psychosocial  Psychosocial (WDL) WDL

## 2018-10-29 NOTE — Progress Notes (Signed)
Post HD assessment    10/29/18 1535  Neurological  Level of Consciousness Alert  Orientation Level Oriented X4  Respiratory  Respiratory Pattern Regular  Chest Assessment Chest expansion symmetrical  Bilateral Breath Sounds Clear;Diminished  Cough Dry  Cardiac  Pulse Regular  Heart Sounds S1, S2  Jugular Venous Distention (JVD) No  ECG Monitor Yes  Cardiac Rhythm Atrial fibrillation  Vascular  R Radial Pulse +2  L Radial Pulse +2  Edema Generalized  Psychosocial  Psychosocial (WDL) WDL

## 2018-10-29 NOTE — Progress Notes (Signed)
HD Tx started   10/29/18 1217  Vital Signs  Pulse Rate 76  Pulse Rate Source Monitor  Resp 19  BP (!) 130/52  BP Location Left Arm  BP Method Automatic  Patient Position (if appropriate) Sitting  Oxygen Therapy  SpO2 97 %  O2 Device Nasal Cannula  O2 Flow Rate (L/min) 2 L/min  Pulse Oximetry Type Continuous  During Hemodialysis Assessment  Blood Flow Rate (mL/min) 400 mL/min  Arterial Pressure (mmHg) -160 mmHg  Venous Pressure (mmHg) 120 mmHg  Transmembrane Pressure (mmHg) 50 mmHg  Ultrafiltration Rate (mL/min) 760 mL/min  Dialysate Flow Rate (mL/min) 600 ml/min  Conductivity: Machine  14.1  HD Safety Checks Performed Yes  Dialysis Fluid Bolus Normal Saline  Bolus Amount (mL) 250 mL  Intra-Hemodialysis Comments Tx initiated

## 2018-10-29 NOTE — Progress Notes (Signed)
Initial Nutrition Assessment  DOCUMENTATION CODES:   Obesity unspecified  INTERVENTION:   Nepro Shake po BID, each supplement provides 425 kcal and 19 grams protein  Rena-vite daily   Ocuvite daily for wound healing (provides zinc, vitamin A, vitamin C, Vitamin E, copper, and selenium)  Liberalize diet   NUTRITION DIAGNOSIS:   Increased nutrient needs related to chronic illness(ESRD on HD, wound healing ) as evidenced by increased estimated needs.  GOAL:   Patient will meet greater than or equal to 90% of their needs  MONITOR:   PO intake, Supplement acceptance, Labs, Weight trends, Skin, I & O's  REASON FOR ASSESSMENT:   LOS    ASSESSMENT:   73 y.o. Caroline Hernandez female with atrial fibrillation, hypertension, diabetes mellitus type II insulin dependent, diabetic neuropathy, congestive heart failure, coronary artery disease status post CABG, GERD, CLL, bilateral below the knee amputations admitted with cough, shortness of breath, UTI and initiated on hemodialysis this admission.    Pt seen by RD on 2/7 for diet education. Pt reported eating 100% of meals at time of RD visit. Pt now with ESRD on HD. Outpatient planning for peritoneal dialysis to be started at discharge at [place] continues to have fair appetite and is drinking Nepro. RD will add ocuvite for wound healing and liberalize pt's diet as electrolytes wnl. Per chart, pt with initial 13lb weight gain after admit but is now down 11lbs from her admit weight. Recommend continue vitamins and supplements.    Medications reviewed and include: insulin, lactulose, protonix, senokot, torsemide  Labs reviewed: creat 3.01(H) Wbc- 48.8(H), Hgb 10.1(L), Hct 33.8(L) cbgs- 168, 177 x 24 hrs  NUTRITION - FOCUSED PHYSICAL EXAM:    Most Recent Value  Orbital Region  No depletion  Upper Arm Region  No depletion  Thoracic and Lumbar Region  No depletion  Buccal Region  No depletion  Temple Region  No depletion  Clavicle  Bone Region  No depletion  Clavicle and Acromion Bone Region  No depletion  Scapular Bone Region  No depletion  Dorsal Hand  No depletion  Patellar Region  No depletion  Anterior Thigh Region  No depletion  Posterior Calf Region  No depletion  Edema (RD Assessment)  Mild  Hair  Reviewed  Eyes  Reviewed  Mouth  Reviewed  Skin  Reviewed  Nails  Reviewed     Diet Order:   Diet Order            Diet - low sodium heart healthy        Diet - low sodium heart healthy        Diet renal/carb modified with fluid restriction Diet-HS Snack? Nothing; Fluid restriction: 1200 mL Fluid; Room service appropriate? Yes; Fluid consistency: Thin  Diet effective now             EDUCATION NEEDS:   Education needs have been addressed  Skin:  Skin Assessment: (Stage I sacrum, Stage I L leg, abdominal incision )  Last BM:  2/16- type 4  Height:   Ht Readings from Last 1 Encounters:  10/25/18 5\' 8"  (1.727 m)    Weight:   Wt Readings from Last 1 Encounters:  10/29/18 101.5 kg    Ideal Body Weight:  63.6 kg  BMI:  Body mass index is 34.03 kg/m.  Estimated Nutritional Needs:   Kcal:  1900-2200kcal/day   Protein:  100-120g/day   Fluid:  UOP + 1L  Koleen Distance MS, RD, LDN Pager #[phone] Office#-  [phone] After Hours Pager: [phone]

## 2018-10-29 NOTE — Progress Notes (Signed)
Date of Admission:  10/16/2018      ID: Caroline Hernandez is a 73 y.o. female  Active Problems:   CHF (congestive heart failure) ([place])   Pressure injury of skin   CLL (chronic lymphocytic leukemia) ([place]) Metapneumovirus viral bronchitis CKD- getting dilaysis  Subjective: Feeling better Cough present but slightly better   Medications:  . albuterol  2.5 mg Nebulization TID  . amitriptyline  25 mg Oral QHS  . amLODipine  10 mg Oral Daily  . apixaban  5 mg Oral BID  . atorvastatin  40 mg Oral QHS  . budesonide (PULMICORT) nebulizer solution  0.5 mg Nebulization BID  . Chlorhexidine Gluconate Cloth  6 each Topical Q0600  . feeding supplement (NEPRO CARB STEADY)  237 mL Oral BID BM  . gabapentin  100 mg Oral BID  . Gerhardt's butt cream   Topical BID  . insulin aspart  0-15 Units Subcutaneous TID WC  . insulin aspart  0-5 Units Subcutaneous QHS  . insulin aspart  5 Units Subcutaneous TID WC  . insulin glargine  15 Units Subcutaneous BID  . isosorbide mononitrate  30 mg Oral Daily  . lactulose  20 g Oral BID  . levofloxacin  500 mg Oral Q48H  . multivitamin  1 tablet Oral QHS  . [START ON 10/30/2018] multivitamin-lutein  1 capsule Oral Daily  . pantoprazole  40 mg Oral QHS  . senna  1 tablet Oral Daily  . sertraline  100 mg Oral QHS  . sodium chloride flush  3 mL Intravenous Q12H  . torsemide  40 mg Oral Daily    Objective: Vital signs in last 24 hours: Temp:  [97.8 F (36.6 C)-98.7 F (37.1 C)] 98.4 F (36.9 C) (02/18 1541) Pulse Rate:  [44-78] 50 (02/18 1541) Resp:  [15-21] 19 (02/18 1541) BP: (117-162)/(44-74) 127/50 (02/18 1541) SpO2:  [90 %-100 %] 95 % (02/18 1541) FiO2 (%):  [21 %] 21 % (02/17 2048) Weight:  [101.5 kg-101.8 kg] 101.5 kg (02/18 0359)  PHYSICAL EXAM:  General: Alert, cooperative, no distress, pale.  Lungs: b/l air entry- rhonchi b/l  Heart: irregular Abdomen: Soft, non-tender,not distended. Bowel sounds normal. No masses Extremities:b/l  BKA  Lab Results Recent Labs    10/27/18 0501 10/28/18 0555 10/29/18 0540  WBC 49.2* 48.0* 48.8*  HGB 10.2* 9.8* 10.1*  HCT 33.8* 32.8* 33.8*  NA 136  --  135  K 3.6  --  4.1  CL 98  --  101  CO2 31  --  27  BUN 17  --  18  CREATININE 2.03*  --  3.01*   Liver Panel No results for input(s): PROT, ALBUMIN, AST, ALT, ALKPHOS, BILITOT, BILIDIR, IBILI in the last 72 hours. Sedimentation Rate No results for input(s): ESRSEDRATE in the last 72 hours. C-Reactive Protein No results for input(s): CRP in the last 72 hours.  Microbiology:  Studies/Results:    Assessment/Plan: 73 year old female admitted with cough, shortness of breath and weakness.   Leucocytosis- lymphocytic predominance - secondary to CLL No improvement on antibiotic  _Acute on chronic congestive heart failure: .  TCough and shortness of breath: Could be due to the metapneumovirus  and  from congestive heart failure.   She has been started on levaquin-on 2/16 day 3 today  CXR from 2/18 no infiltrate -but shows congestion- so this is likely bronchitis- not sure whether she got better after Levaquin or after getting a series of dialysis. Would recommend DC levaquin  Asymptomatic bacteriuria was  treated with 1 week of ceftriaxone. urine smelly urine which is not sign of urinary tract infection.  No antibiotic needed now. ? Has ICD- no concern for infection - 2/13 BC negative  Worsening renal function.  on dialysis now.   Chronic A. fib: On Coumadin, amiodarone   Diabetes mellitus on sliding scale insulin   Discussed the management with patient ID will sign off- call if needed

## 2018-10-29 NOTE — Care Management Important Message (Signed)
Important Message  Patient Details  Name: Caroline Hernandez MRN: [id number] Date of Birth: January 06, 1946   Medicare Important Message Given:  Yes    [name], RN 10/29/2018, 1:17 PM

## 2018-10-29 NOTE — Telephone Encounter (Signed)
Spoke with pt who is actually currently admitted. She states that she has been changed to Eliquis while in hospital so she will not need refill on warfarin or INR follow up.

## 2018-10-29 NOTE — Plan of Care (Signed)
  Problem: Education: ?Goal: Ability to demonstrate management of disease process will improve ?Outcome: Progressing ?Goal: Ability to verbalize understanding of medication therapies will improve ?Outcome: Progressing ?Goal: Individualized Educational Video(s) ?Outcome: Progressing ?  ?Problem: Activity: ?Goal: Capacity to carry out activities will improve ?Outcome: Progressing ?  ?Problem: Cardiac: ?Goal: Ability to achieve and maintain adequate cardiopulmonary perfusion will improve ?Outcome: Progressing ?  ?Problem: Education: ?Goal: Knowledge of General Education information will improve ?Description: Including pain rating scale, medication(s)/side effects and non-pharmacologic comfort measures ?Outcome: Progressing ?  ?Problem: Health Behavior/Discharge Planning: ?Goal: Ability to manage health-related needs will improve ?Outcome: Progressing ?  ?Problem: Clinical Measurements: ?Goal: Ability to maintain clinical measurements within normal limits will improve ?Outcome: Progressing ?Goal: Will remain free from infection ?Outcome: Progressing ?Goal: Diagnostic test results will improve ?Outcome: Progressing ?Goal: Respiratory complications will improve ?Outcome: Progressing ?Goal: Cardiovascular complication will be avoided ?Outcome: Progressing ?  ?

## 2018-10-30 DIAGNOSIS — I739 Peripheral vascular disease, unspecified: Secondary | ICD-10-CM

## 2018-10-30 LAB — GLUCOSE, CAPILLARY
Glucose-Capillary: 149 mg/dL — ABNORMAL HIGH (ref 70–99)
Glucose-Capillary: 168 mg/dL — ABNORMAL HIGH (ref 70–99)
Glucose-Capillary: 145 mg/dL — ABNORMAL HIGH (ref 70–99)
Glucose-Capillary: 168 mg/dL — ABNORMAL HIGH (ref 70–99)

## 2018-10-30 LAB — BASIC METABOLIC PANEL
Anion gap: 7 (ref 5–15)
BUN: 18 mg/dL (ref 8–23)
CO2: 29 mmol/L (ref 22–32)
Calcium: 8.2 mg/dL — ABNORMAL LOW (ref 8.9–10.3)
Chloride: 101 mmol/L (ref 98–111)
Creatinine, Ser: 3.08 mg/dL — ABNORMAL HIGH (ref 0.44–1.00)
GFR calc Af Amer: 17 mL/min — ABNORMAL LOW (ref >60)
GFR, EST NON AFRICAN AMERICAN: 15 mL/min — AB (ref >60)
Glucose, Bld: 165 mg/dL — ABNORMAL HIGH (ref 70–99)
Potassium: 4 mmol/L (ref 3.5–5.1)
Sodium: 137 mmol/L (ref 135–145)

## 2018-10-30 LAB — CBC
HCT: 32.4 % — ABNORMAL LOW (ref 36.0–46.0)
Hemoglobin: 9.7 g/dL — ABNORMAL LOW (ref 12.0–15.0)
MCH: 25.7 pg — ABNORMAL LOW (ref 26.0–34.0)
MCHC: 29.9 g/dL — ABNORMAL LOW (ref 30.0–36.0)
MCV: 85.9 fL (ref 80.0–100.0)
Platelets: 143 10*3/uL — ABNORMAL LOW (ref 150–400)
RBC: 3.77 MIL/uL — ABNORMAL LOW (ref 3.87–5.11)
RDW: 18.7 % — ABNORMAL HIGH (ref 11.5–15.5)
WBC: 44.8 10*3/uL — ABNORMAL HIGH (ref 4.0–10.5)
nRBC: 0 % (ref 0.0–0.2)

## 2018-10-30 MED ORDER — AMLODIPINE BESYLATE 5 MG PO TABS: 5.0000 mg | ORAL_TABLET | Freq: Every day | ORAL | Status: DC

## 2018-10-30 NOTE — Progress Notes (Signed)
Nutrition Brief Note   RD received consult for CHF diet education. Pt previously educated on CHF diet on 2/7 (see note from student Dietitian). If pt needs further diet education or has additional questions please notify Dietitian via phone.   RD following this pt  Caroline Distance MS, RD, LDN Pager #[phone] Office#- [phone] After Hours Pager: [phone]

## 2018-10-30 NOTE — Progress Notes (Signed)
Progress Note  Patient Name: Caroline Hernandez Date of Encounter: 10/30/2018  Primary Cardiologist: Ida Rogue, MD   Subjective   Still a little lightheaded and generally weak.  No CP, shortness of breath, or edema.  Unsure if she will be getting HD again today.  Minimal UOP.  Inpatient Medications    Scheduled Meds: . albuterol  2.5 mg Nebulization TID  . amitriptyline  25 mg Oral QHS  . apixaban  5 mg Oral BID  . atorvastatin  40 mg Oral QHS  . budesonide (PULMICORT) nebulizer solution  0.5 mg Nebulization BID  . Chlorhexidine Gluconate Cloth  6 each Topical Q0600  . feeding supplement (NEPRO CARB STEADY)  237 mL Oral BID BM  . gabapentin  100 mg Oral BID  . Gerhardt's butt cream   Topical BID  . insulin aspart  0-15 Units Subcutaneous TID WC  . insulin aspart  0-5 Units Subcutaneous QHS  . insulin aspart  5 Units Subcutaneous TID WC  . insulin glargine  15 Units Subcutaneous BID  . isosorbide mononitrate  30 mg Oral Daily  . lactulose  20 g Oral BID  . levofloxacin  500 mg Oral Q48H  . multivitamin  1 tablet Oral QHS  . multivitamin-lutein  1 capsule Oral Daily  . pantoprazole  40 mg Oral QHS  . senna  1 tablet Oral Daily  . sertraline  100 mg Oral QHS  . sodium chloride flush  3 mL Intravenous Q12H  . torsemide  40 mg Oral Daily   Continuous Infusions: . sodium chloride Stopped (10/22/18 0104)  . clindamycin (CLEOCIN) IV     PRN Meds: sodium chloride, acetaminophen **OR** acetaminophen, guaiFENesin-dextromethorphan, hydrALAZINE, hydrOXYzine, ipratropium, nitroGLYCERIN, ondansetron **OR** ondansetron (ZOFRAN) IV, traMADol   Vital Signs    Vitals:   10/29/18 1951 10/29/18 2132 10/30/18 0605 10/30/18 0810  BP: (!) 143/48  (!) 131/57 (!) 125/58  Pulse: (!) 55  (!) 56 (!) 53  Resp: 18  18   Temp: 99.3 F (37.4 C)  99 F (37.2 C)   TempSrc: Oral  Oral   SpO2: 95% 95% 96% 97%  Weight:   97.5 kg   Height:        Intake/Output Summary (Last 24 hours) at  10/30/2018 0932 Last data filed at 10/29/2018 1520 Gross per 24 hour  Intake -  Output 3020 ml  Net -3020 ml   Last 3 Weights 10/30/2018 10/29/2018 10/29/2018  Weight (lbs) 215 lb (No Data) (No Data)  Weight (kg) 97.523 kg (No Data) (No Data)      Telemetry    A-fib with ventricular rate of 45-55. - Personally Reviewed  ECG    No new tracing.  Physical Exam   GEN: No acute distress.   Neck: No JVD Cardiac: RRR, no murmurs, rubs, or gallops.  Respiratory: Clear to auscultation bilaterally. GI: Soft, nontender, non-distended  MS: No edema; No deformity. Neuro:  Nonfocal  Psych: Normal affect   Labs    Chemistry Recent Labs  Lab 10/26/18 1129 10/27/18 0501 10/29/18 0540 10/30/18 0556  NA 136 136 135 137  K 3.8 3.6 4.1 4.0  CL 99 98 101 101  CO2 31 31 27 29   GLUCOSE 143* 134* 179* 165*  BUN 28* 17 18 18   CREATININE 2.09* 2.03* 3.01* 3.08*  CALCIUM 7.7* 7.9* 8.1* 8.2*  ALBUMIN 3.2*  --   --   --   GFRNONAA 23* 24* 15* 15*  GFRAA 27* 28* 17* 17*  ANIONGAP  6 7 7 7      Hematology Recent Labs  Lab 10/28/18 0555 10/29/18 0540 10/30/18 0556  WBC 48.0* 48.8* 44.8*  RBC 3.88 4.00 3.77*  HGB 9.8* 10.1* 9.7*  HCT 32.8* 33.8* 32.4*  MCV 84.5 84.5 85.9  MCH 25.3* 25.3* 25.7*  MCHC 29.9* 29.9* 29.9*  RDW 18.6* 18.7* 18.7*  PLT 121* 128* 143*    Cardiac EnzymesNo results for input(s): TROPONINI in the last 168 hours. No results for input(s): TROPIPOC in the last 168 hours.   BNPNo results for input(s): BNP, PROBNP in the last 168 hours.   DDimer No results for input(s): DDIMER in the last 168 hours.   Radiology    Dg Chest Port 1 View  Result Date: 10/29/2018 CLINICAL DATA:  Increased shortness of breath; history coronary artery disease, CHF, stage IV chronic kidney disease, CLL, diabetes mellitus, hypertension, ischemic cardiomyopathy, persistent atrial fibrillation EXAM: PORTABLE CHEST 1 VIEW COMPARISON:  Portable exam 1151 hours compared to 10/27/2018  FINDINGS: RIGHT jugular dual-lumen central venous catheter with tip projecting over cavoatrial junction. LEFT subclavian AICD with leads projecting over RIGHT ventricle. Enlargement of cardiac silhouette with pulmonary vascular congestion post median sternotomy. Atherosclerotic calcifications aorta. Minimal LEFT basilar atelectasis. Lungs otherwise clear. No pleural effusion or pneumothorax. IMPRESSION: Minimal LEFT basilar atelectasis. Electronically Signed   By: Lavonia Dana M.D.   On: 10/29/2018 13:46    Cardiac Studies   TTE 10/11/2018 1. The left ventricle has moderately reduced systolic function of 44-96%. The cavity size is mildly increased. There is no left ventricular wall thickness. The left ventricular diastology could not be evaluated due to nondiagnostic images. 2. There is severe hypokinesis of the entire septal, anteroseptal and anterior left ventricular segments. 3. Mildly dilated left atrial size. 4. The mitral valve normal in structure. Regurgitation is mild by color flow Doppler. 5. Normal tricuspid valve. 6. Tricuspid regurgitation mild-moderate. 7. The aortic valve normal. There is moderate sclerosis of the aortic valve. 8. The right ventricle is mildly enlarged in size. There is normal systolic function. Right ventricular systolic pressure is mildly elevated with an estimated pressure of 42.4 mmHg.  Patient Profile     73 y.o. female with a history of CAD s/p bypass surgery 2004, CLL, diabetes, b/l below the knee amputation for PAD, atrial fibrillation, ICD, ablation, acute on chronic HFrEF who presented to the hospital in respiratory distress with urinary tract infection and acute on chronic renal failure.  Assessment & Plan    Persistent atrial fibrillation HR still low despite being off amio and AV nodal blocking agents.  Continue to hold amio and beta blockers.  Will d/c amlodipine given soft BP and lightheadedness.  If patient remains symptomatic, she may  need base rate of her ICD adjusted.  If she is still here tomorrow, I will have Dr. Caryl Comes evaluate her.  Long-term, she will need to be considered for CRT if she remains bradycardic/pacemaker dependent.  Continue apixaban  Acute on chronic systolic HF Appears euvolemic.  Continue volume management per nephrology.  Favor stopping torsemide, as it does not appear to be doing much; will defer to nephrology.  BB on hold given bradycardia.  ACEI/ARB/AA contraindicated in the setting of AKI.  If she will be ESRD, addition of low-dose ACEI/ARB will need to be considered in the future if BP allows.  CAD No active symptoms.  Continue secondary prevention and antianginal therapy with isosorbide mononitrate.  Hold amlodipine, as above.  PAD  Continue secondary prevention.  Acute  on chronic kidney disease Essentially anuric.  Per nephrology.    For questions or updates, please contact [place] Please consult [email or web address] for contact info under [facility].    Signed, Nelva Bush, MD  10/30/2018, 9:32 AM

## 2018-10-30 NOTE — Progress Notes (Signed)
[place] at [place] NAME: Caroline Hernandez    MR#:  [id number]  DATE OF BIRTH:  1946/03/10  SUBJECTIVE:  dizzi REVIEW OF SYSTEMS:    Review of Systems  Constitutional: Negative.   HENT: Negative.   Eyes: Negative.   Respiratory: Cough and shortness of breath. Cardiovascular: Negative.   Gastrointestinal: Negative.   Genitourinary: Negative.   Musculoskeletal: Negative.   Skin: Negative.   Neurological: Negative.   Endo/Heme/Allergies: Negative.   Psychiatric/Behavioral: Negative.   All other systems reviewed and are negative.  DRUG ALLERGIES:   Allergies  Allergen Reactions  . Piperacillin Other (See Comments)    Renal failure  . 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 See ID note**PER PT-CAUSED RENAL FAILURE**  . Zosyn [Piperacillin Sod-Tazobactam So] Other (See Comments)    Renal failure    VITALS:  Blood pressure (!) 125/58, pulse (!) 53, temperature 99 F (37.2 C), temperature source Oral, resp. rate 18, height 5\' 8"  (1.727 m), weight 97.5 kg, SpO2 97 %.  PHYSICAL EXAMINATION:   Physical Exam  GENERAL:  73 y.o.-year-old patient lying in the bed on oxygen via nasal cannula.  Obesity. Unable to speak in full sentences without shortness of breath EYES: Pupils equal, round, reactive to light and accommodation. No scleral icterus. Extraocular muscles intact.  HEENT: Head atraumatic, normocephalic. Oropharynx and nasopharynx clear.  NECK:  Supple, no jugular venous distention. No thyroid enlargement, no tenderness.  LUNGS: Bilateral basilar rales, no wheezing or rhonchi.  No use of accessory muscles to breathe. CARDIOVASCULAR: S1, S2 normal. No murmurs, rubs, or gallops.  ABDOMEN: Soft, nontender, nondistended. Bowel sounds present. No organomegaly or mass.  EXTREMITIES: No cyanosis, clubbing or edema b/l.   Bilateral below-knee amputations NEUROLOGIC: Cranial nerves II through XII are  intact. No focal Motor or sensory deficits b/l.   PSYCHIATRIC: The patient is alert and oriented x 3.  SKIN: No obvious rash, lesion, or ulcer.   LABORATORY PANEL:   CBC Recent Labs  Lab 10/30/18 0556  WBC 44.8*  HGB 9.7*  HCT 32.4*  PLT 143*   ------------------------------------------------------------------------------------------------------------------ Chemistries  Recent Labs  Lab 10/25/18 0754  10/30/18 0556  NA 139   < > 137  K 3.5   < > 4.0  CL 104   < > 101  CO2 30   < > 29  GLUCOSE 57*   < > 165*  BUN 44*   < > 18  CREATININE 2.06*   < > 3.08*  CALCIUM 8.1*   < > 8.2*  MG 2.3  --   --    < > = values in this interval not displayed.   ------------------------------------------------------------------------------------------------------------------  Cardiac Enzymes No results for input(s): TROPONINI in the last 168 hours. ------------------------------------------------------------------------------------------------------------------  RADIOLOGY:  Dg Chest Port 1 View  Result Date: 10/29/2018 CLINICAL DATA:  Increased shortness of breath; history coronary artery disease, CHF, stage IV chronic kidney disease, CLL, diabetes mellitus, hypertension, ischemic cardiomyopathy, persistent atrial fibrillation EXAM: PORTABLE CHEST 1 VIEW COMPARISON:  Portable exam 1151 hours compared to 10/27/2018 FINDINGS: RIGHT jugular dual-lumen central venous catheter with tip projecting over cavoatrial junction. LEFT subclavian AICD with leads projecting over RIGHT ventricle. Enlargement of cardiac silhouette with pulmonary vascular congestion post median sternotomy. Atherosclerotic calcifications aorta. Minimal LEFT basilar atelectasis. Lungs otherwise clear. No pleural effusion or pneumothorax. IMPRESSION: Minimal LEFT basilar atelectasis. Electronically Signed   By: Lavonia Dana M.D.   On:  10/29/2018 13:46   ASSESSMENT AND PLAN:   *Acute bronchitis improved - continue oral  steroid. - Scheduled Nebulizers - Inhalers -Wean O2 as tolerated, she doesn't use O2 at home -Respiratory panel with Metapneumovirus. -Wean oxygen as tolerated, NEB prn.  *Acute kidney injury over CKD stage III  - hemodialysis today per Dr. Candiss Norse  *Acute respiratory failure due to acute on chronic systolic congestive heart failure, 35-40%.   she need home oxygen 2 L by nasal cannula. Wean to room air if able DuoNeb as needed, Robitussin as needed. Continue hemodialysis per Nephro - another session planned for tomorrow  *UTI with leukocytosis.  Klebsiella in urine cultures.  Completed oral Keflex antibiotic  *Chronic atrial fibrillation.  Discontinue amiodarone and Coreg due to bradycardia.  No cardioversion this time per Dr. Rockey Situ. - continue Eliquis  *Diabetes mellitus type 2.  On sliding scale insulin along with Lantus insulin Monitor blood sugars closely to avoid hypoglycemia  *Leukocytosis, history of CLL. No evidence of any sepsis. Possible due to reaction to steroid, not due to CLL per oncology consult.  Worsening leukocytosis.  Follow-up CBC.  Chest x-ray. May start levaquin per Dr. Rockey Situ.  Generalized weakness.  The patient wants to go home with home health.   Initially planned on EP eval tomorrow but dr Caryl Comes is on vacation so will have to be outpt. Now daughter is gone and can't pick her up till tomorrow.    All the records are reviewed and case discussed with Care Management/Social Worker Management plans discussed with the patient, nursing and they are in agreement.  CODE STATUS: Full code  DVT Prophylaxis: SCDs  TOTAL TIME TAKING CARE OF THIS PATIENT: 35 minutes.   POSSIBLE D/C IN 1 DAYS, DEPENDING ON CLINICAL CONDITION.  Max Sane M.D on 10/30/2018 at 3:24 PM  Between 7am to 6pm - Pager - [phone]  After 6pm go to [email or web address] - password [email or web address] [facility]  Office  [phone]  CC: Primary care physician; System,  Provider Not In  Note: This dictation was prepared with Dragon dictation along with smaller phrase technology. Any transcriptional errors that result from this process are unintentional.

## 2018-10-30 NOTE — Progress Notes (Signed)
[facility]  ROUNDING NOTE   Subjective:   Patient underwent hemodialysis successfully yesterday.  2500 cc of fluid was removed the previous day and another 3 L removed yesterday.  Total of 5.5 L in the last 2 days.  Really nice she feels better but still has coughing and wheezing.  Appetite is fair.   Objective:  Vital signs in last 24 hours:  Temp:  [97.8 F (36.6 C)-99.3 F (37.4 C)] 99 F (37.2 C) (02/19 0605) Pulse Rate:  [44-78] 53 (02/19 0810) Resp:  [15-21] 18 (02/19 0605) BP: (117-145)/(44-58) 125/58 (02/19 0810) SpO2:  [95 %-100 %] 97 % (02/19 0810) Weight:  [97.5 kg] 97.5 kg (02/19 0605)  Weight change: -6.076 kg Filed Weights   10/29/18 0359 10/30/18 0605  Weight: 101.5 kg 97.5 kg    Intake/Output: I/O last 3 completed shifts: In: -  Out: 3080 [Urine:60; Other:3020]   Intake/Output this shift:  No intake/output data recorded.  Physical Exam: General: No acute distress  Head: Normocephalic, atraumatic. Moist oral mucosal membranes  Eyes: Anicteric  Neck: Supple,  Lungs:  Scattered rhonchi, normal effort, oxygen by nasal cannula  Heart: S1S2 no rubs, 2/6 murmur,  Abdomen:  Soft, PD catheter in place  Extremities: B/L BKA  Neurologic: Awake, alert, following commands  Skin: No lesions  Access:  [facility] permcath 2/12 Dr. Delana Meyer, PD catheter Dr. Delana Meyer 2/29    Basic Metabolic Panel: Recent Labs  Lab 10/25/18 0754 10/26/18 1129 10/27/18 0501 10/29/18 0540 10/30/18 0556  NA 139 136 136 135 137  K 3.5 3.8 3.6 4.1 4.0  CL 104 99 98 101 101  CO2 30 31 31 27 29   GLUCOSE 57* 143* 134* 179* 165*  BUN 44* 28* 17 18 18   CREATININE 2.06* 2.09* 2.03* 3.01* 3.08*  CALCIUM 8.1* 7.7* 7.9* 8.1* 8.2*  MG 2.3  --   --   --   --   PHOS  --  2.7  --   --   --     Liver Function Tests: Recent Labs  Lab 10/26/18 1129  ALBUMIN 3.2*   No results for input(s): LIPASE, AMYLASE in the last 168 hours. No results for input(s): AMMONIA in the last 168  hours.  CBC: Recent Labs  Lab 10/24/18 0613  10/26/18 0332 10/27/18 0501 10/28/18 0555 10/29/18 0540 10/30/18 0556  WBC 34.6*   < > 44.5* 49.2* 48.0* 48.8* 44.8*  NEUTROABS 7.2  --   --   --   --   --   --   HGB 10.7*   < > 10.2* 10.2* 9.8* 10.1* 9.7*  HCT 34.7*   < > 34.2* 33.8* 32.8* 33.8* 32.4*  MCV 82.0   < > 83.6 84.3 84.5 84.5 85.9  PLT 140*   < > 129* 123* 121* 128* 143*   < > = values in this interval not displayed.    Cardiac Enzymes: No results for input(s): CKTOTAL, CKMB, CKMBINDEX, TROPONINI in the last 168 hours.  BNP: Invalid input(s): POCBNP  CBG: Recent Labs  Lab 10/29/18 1137 10/29/18 1547 10/29/18 2110 10/30/18 0807 10/30/18 0809  GLUCAP 177* 156* 112* 25* 149*    Microbiology: Results for orders placed or performed during the hospital encounter of 10/16/18  Respiratory Panel by PCR     Status: Abnormal   Collection Time: 10/16/18  4:05 AM  Result Value Ref Range Status   Adenovirus NOT DETECTED NOT DETECTED Final   Coronavirus 229E NOT DETECTED NOT DETECTED Final  Comment: (NOTE) The Coronavirus on the Respiratory Panel, DOES NOT test for the novel  Coronavirus (2019 nCoV)    Coronavirus HKU1 NOT DETECTED NOT DETECTED Final   Coronavirus NL63 NOT DETECTED NOT DETECTED Final   Coronavirus OC43 NOT DETECTED NOT DETECTED Final   Metapneumovirus DETECTED (A) NOT DETECTED Final   Rhinovirus / Enterovirus NOT DETECTED NOT DETECTED Final   Influenza A NOT DETECTED NOT DETECTED Final   Influenza B NOT DETECTED NOT DETECTED Final   Parainfluenza Virus 1 NOT DETECTED NOT DETECTED Final   Parainfluenza Virus 2 NOT DETECTED NOT DETECTED Final   Parainfluenza Virus 3 NOT DETECTED NOT DETECTED Final   Parainfluenza Virus 4 NOT DETECTED NOT DETECTED Final   Respiratory Syncytial Virus NOT DETECTED NOT DETECTED Final   Bordetella pertussis NOT DETECTED NOT DETECTED Final   Chlamydophila pneumoniae NOT DETECTED NOT DETECTED Final   Mycoplasma  pneumoniae NOT DETECTED NOT DETECTED Final    Comment: Performed at [facility], [address] [place], [place] [address]  Urine culture     Status: Abnormal   Collection Time: 10/16/18  4:23 AM  Result Value Ref Range Status   Specimen Description URINE, CLEAN CATCH  Final   Special Requests   Final    NONE Performed at [facility], [address] [place], [place] [address]    Culture >=100,000 COLONIES/mL KLEBSIELLA PNEUMONIAE (A)  Final   Report Status 10/19/2018 FINAL  Final   Organism ID, Bacteria KLEBSIELLA PNEUMONIAE (A)  Final      Susceptibility   Klebsiella pneumoniae - MIC*    AMPICILLIN RESISTANT Resistant     CEFAZOLIN <=4 SENSITIVE Sensitive     CEFTRIAXONE <=1 SENSITIVE Sensitive     CIPROFLOXACIN <=0.25 SENSITIVE Sensitive     GENTAMICIN <=1 SENSITIVE Sensitive     IMIPENEM <=0.25 SENSITIVE Sensitive     NITROFURANTOIN <=16 SENSITIVE Sensitive     TRIMETH/SULFA <=20 SENSITIVE Sensitive     AMPICILLIN/SULBACTAM 4 SENSITIVE Sensitive     PIP/TAZO <=4 SENSITIVE Sensitive     Extended ESBL NEGATIVE Sensitive     * >=100,000 COLONIES/mL KLEBSIELLA PNEUMONIAE  MRSA PCR Screening     Status: Abnormal   Collection Time: 10/16/18  5:13 PM  Result Value Ref Range Status   MRSA by PCR POSITIVE (A) NEGATIVE Final    Comment:        The GeneXpert MRSA Assay (FDA approved for NASAL specimens only), is one component of a comprehensive MRSA colonization surveillance program. It is not intended to diagnose MRSA infection nor to guide or monitor treatment for MRSA infections. RESULT CALLED TO, READ BACK BY AND VERIFIED WITH: MAI QUIARORO 10/16/18 @ 1838  [place]as Performed at [facility], [address] [place], [place] [address]   CULTURE, BLOOD (ROUTINE X 2) w Reflex to ID Panel     Status: None   Collection Time: 10/24/18 12:11 AM  Result Value Ref Range Status   Specimen Description BLOOD RIGHT ASSIST CONTROL  Final   Special Requests    Final    BOTTLES DRAWN AEROBIC AND ANAEROBIC Blood Culture adequate volume   Culture   Final    NO GROWTH 5 DAYS Performed at [facility], [address] [place], [place] [address]    Report Status 10/29/2018 FINAL  Final  CULTURE, BLOOD (ROUTINE X 2) w Reflex to ID Panel     Status: None   Collection Time: 10/24/18 12:17 AM  Result Value Ref  Range Status   Specimen Description BLOOD RIGHT HAND  Final   Special Requests   Final    BOTTLES DRAWN AEROBIC AND ANAEROBIC Blood Culture adequate volume   Culture   Final    NO GROWTH 5 DAYS Performed at [facility], [address] [place], [place] [address]    Report Status 10/29/2018 FINAL  Final    Coagulation Studies: No results for input(s): LABPROT, INR in the last 72 hours.  Urinalysis: No results for input(s): COLORURINE, LABSPEC, PHURINE, GLUCOSEU, HGBUR, BILIRUBINUR, KETONESUR, PROTEINUR, UROBILINOGEN, NITRITE, LEUKOCYTESUR in the last 72 hours.  Invalid input(s): APPERANCEUR    Imaging: Dg Chest Port 1 View  Result Date: 10/29/2018 CLINICAL DATA:  Increased shortness of breath; history coronary artery disease, CHF, stage IV chronic kidney disease, CLL, diabetes mellitus, hypertension, ischemic cardiomyopathy, persistent atrial fibrillation EXAM: PORTABLE CHEST 1 VIEW COMPARISON:  Portable exam 1151 hours compared to 10/27/2018 FINDINGS: RIGHT jugular dual-lumen central venous catheter with tip projecting over cavoatrial junction. LEFT subclavian AICD with leads projecting over RIGHT ventricle. Enlargement of cardiac silhouette with pulmonary vascular congestion post median sternotomy. Atherosclerotic calcifications aorta. Minimal LEFT basilar atelectasis. Lungs otherwise clear. No pleural effusion or pneumothorax. IMPRESSION: Minimal LEFT basilar atelectasis. Electronically Signed   By: Lavonia Dana M.D.   On: 10/29/2018 13:46     Medications:   . sodium chloride Stopped (10/22/18 0104)  . clindamycin  (CLEOCIN) IV     . albuterol  2.5 mg Nebulization TID  . amitriptyline  25 mg Oral QHS  . apixaban  5 mg Oral BID  . atorvastatin  40 mg Oral QHS  . budesonide (PULMICORT) nebulizer solution  0.5 mg Nebulization BID  . Chlorhexidine Gluconate Cloth  6 each Topical Q0600  . feeding supplement (NEPRO CARB STEADY)  237 mL Oral BID BM  . gabapentin  100 mg Oral BID  . Gerhardt's butt cream   Topical BID  . insulin aspart  0-15 Units Subcutaneous TID WC  . insulin aspart  0-5 Units Subcutaneous QHS  . insulin aspart  5 Units Subcutaneous TID WC  . insulin glargine  15 Units Subcutaneous BID  . isosorbide mononitrate  30 mg Oral Daily  . lactulose  20 g Oral BID  . levofloxacin  500 mg Oral Q48H  . multivitamin  1 tablet Oral QHS  . multivitamin-lutein  1 capsule Oral Daily  . pantoprazole  40 mg Oral QHS  . senna  1 tablet Oral Daily  . sertraline  100 mg Oral QHS  . sodium chloride flush  3 mL Intravenous Q12H  . torsemide  40 mg Oral Daily   sodium chloride, acetaminophen **OR** acetaminophen, guaiFENesin-dextromethorphan, hydrALAZINE, hydrOXYzine, ipratropium, nitroGLYCERIN, ondansetron **OR** ondansetron (ZOFRAN) IV, traMADol  Assessment/ Plan:  [name] is a 73 y.o. [place] female with atrial fibrillation, hypertension, diabetes mellitus type II insulin dependent, diabetic neuropathy, congestive heart failure, coronary artery disease status post CABG, GERD, CLL, bilateral below the knee amputations admitted with cough, shortness of breath, UTI and initiated on hemodialysis this admission.   1.  End Stage Renal Disease with hyponatremia and uremia.  First dialysis 2/12 through permcath.  PD catheter placed by Dr. Delana Meyer of 2/14.  Outpatient planning for urgent start peritoneal dialysis to be started on discharge at [facility].   Tentatively scheduled for Thursday. Permcath to stay in place for back up hemodialysis as necessary.  Extra hemodialysis sessions for the last  2 days removed total of 5.5 L of  fluid  Continue torsemide even though urine output is low   2.  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  Urinary tract infection. Klebsiella - cephalexin completed, now on levofloxacin  4. Diabetes mellitus type II with chronic kidney disease: insulin dependent. history of poor control.   5. Hypertension with chronic kidney disease:  - amlodipine discontinued by cardiologist.  Can consider ACE inhibitor or ARB as outpatient if blood pressure increases above 958 systolic. -Continue outpatient follow-up of bradycardia  6. Atrial fibrillation:  - transitioned to PO apixaban. -Amiodarone discontinued due to bradycardia   LOS: 14 Isola Mehlman 2/19/202010:16 AM

## 2018-10-31 ENCOUNTER — Other Ambulatory Visit: Payer: Self-pay | Admitting: Cardiovascular Disease

## 2018-10-31 ENCOUNTER — Telehealth: Payer: Self-pay | Admitting: *Deleted

## 2018-10-31 ENCOUNTER — Ambulatory Visit: Payer: Medicare HMO | Admitting: Family

## 2018-10-31 LAB — GLUCOSE, CAPILLARY
Glucose-Capillary: 25 mg/dL — CL (ref 70–99)
Glucose-Capillary: 189 mg/dL — ABNORMAL HIGH (ref 70–99)

## 2018-10-31 NOTE — Progress Notes (Signed)
HD Tx End   10/31/18 1252  Hand-Off documentation  Report given to (Full Name) Thomas Hoff RN  Report received from (Full Name) Louretta Shorten RN  Vital Signs  Temp 98.1 F (36.7 C)  Temp Source Oral  Pulse Rate (!) 52  Resp 17  BP (!) 124/52  BP Location Left Arm  BP Method Automatic  Patient Position (if appropriate) Sitting  Oxygen Therapy  SpO2 100 %  O2 Device Nasal Cannula  O2 Flow Rate (L/min) 2 L/min  Pain Assessment  Pain Scale 0-10  Pain Score 0  Dialysis Weight  Weight  (uta-recliner)  Type of Weight Post-Dialysis  During Hemodialysis Assessment  Intra-Hemodialysis Comments Tolerated well;Tx completed  Post-Hemodialysis Assessment  Rinseback Volume (mL) 250 mL  Dialyzer Clearance Lightly streaked  Duration of HD Treatment -hour(s) 3 hour(s)  Hemodialysis Intake (mL) 500 mL  UF Total -Machine (mL) 2000 mL  Net UF (mL) 1500 mL  Tolerated HD Treatment Yes  Post-Hemodialysis Comments tolerated well. goal met  Hemodialysis Catheter  Placement Date: 10/21/18    Site Condition No complications  Blue Lumen Status Heparin locked  Red Lumen Status Heparin locked  Catheter fill solution Heparin 1000 units/ml  Catheter fill volume (Arterial) 1.6 cc  Catheter fill volume (Venous) 1.6  Dressing Type Biopatch  Dressing Status Clean;Dry;Intact  Interventions Dressing changed  Dressing Change Due 11/07/18  Post treatment catheter status Capped and Clamped

## 2018-10-31 NOTE — Progress Notes (Signed)
Post HD Tx Assessment   10/31/18 1315  Neurological  Level of Consciousness Alert  Orientation Level Oriented X4  Respiratory  Respiratory Pattern Regular;Unlabored  Chest Assessment Chest expansion symmetrical  Bilateral Breath Sounds Diminished;Rhonchi  Cardiac  Pulse Irregular  Cardiac Rhythm Atrial fibrillation  Antiarrhythmic device Yes  Vascular  R Radial Pulse +2  L Radial Pulse +2  Edema Generalized  Integumentary  Integumentary (WDL) X  Musculoskeletal  Musculoskeletal (WDL) X  Generalized Weakness Yes  Urine Characteristics  Urine Color Yellow/straw  Psychosocial  Psychosocial (WDL) WDL

## 2018-10-31 NOTE — Telephone Encounter (Signed)
-----   Message from Nelva Bush, MD sent at 10/31/2018  8:17 AM EST ----- No.  Let's cancel DCCV for now.  If anything, she may need her device reprogrammed or a BiV upgrade.  Gerald Stabs ----- Message ----- From: Emily Filbert, RN Sent: 10/31/2018   8:01 AM EST To: Nelva Bush, MD  Hey Dr. Saunders Revel,  I saw you saw this patient yesterday. When Dr. Caryl Comes saw her in the office on 2/4, we scheduled her for a DCCV that is still set for 2/26. Should I keep that scheduled you think?  Dr. Caryl Comes left for [place] yesterday.  Thanks!

## 2018-10-31 NOTE — Telephone Encounter (Signed)
I spoke with Caroline Hernandez in scheduling, the patient's DCCV has been cancelled for 11/06/18 per Dr. Saunders Revel.

## 2018-10-31 NOTE — Progress Notes (Signed)
Pt not given any a.m. medications 2/2 HD pending and discharge pending. Pt wants to take medication at home. I will continue to assess.

## 2018-10-31 NOTE — Progress Notes (Signed)
Pre HD Tx   10/31/18 0945  Vital Signs  Pulse Rate 62  Resp (!) 21  BP (!) 124/52  BP Location Left Arm  BP Method Automatic  Patient Position (if appropriate) Lying  Oxygen Therapy  SpO2 98 %  O2 Device Nasal Cannula  O2 Flow Rate (L/min) 2 L/min  Pain Assessment  Pain Scale 0-10  Pain Score 0  Dialysis Weight  Weight 98.4 kg  Type of Weight Pre-Dialysis  Time-Out for Hemodialysis  What Procedure?  (HD)  Pt Identifiers(min of two) First/Last Name;MRN/Account#  Correct Site? Yes  Correct Side? Yes  Correct Procedure? Yes  Consents Verified? Yes  Rad Studies Available? No  Safety Precautions Reviewed? Yes  Engineer, civil (consulting) Number 4  Station Number 4  UF/Alarm Test Passed  Conductivity: Meter 14  Conductivity: Machine  14  pH 7.2  Reverse Osmosis main  Normal Saline Lot Number [id number]  Dialyzer Lot Number [id number]  Disposable Set Lot Number [id number]  Machine Temperature 98.6 F (37 C)  Musician and Audible Yes  Blood Lines Intact and Secured Yes  Pre Treatment Patient Checks  Vascular access used during treatment Catheter  Patient is receiving dialysis in a chair Yes  Hepatitis B Surface Antigen Results Negative  Date Hepatitis B Surface Antigen Drawn 10/21/18  Isolation Initiated Yes  Hepatitis B Surface Antibody  (<10)  Date Hepatitis B Surface Antibody Drawn 10/21/18  Hemodialysis Consent Verified Yes  Hemodialysis Standing Orders Initiated Yes  ECG (Telemetry) Monitor On Yes  Prime Ordered Normal Saline  Length of  DialysisTreatment -hour(s) 3 Hour(s)  Dialyzer Elisio 17H NR  Dialysate 3K, 2.5 Ca  Dialysate Flow Ordered 600  Blood Flow Rate Ordered 400 mL/min  Dialysis Blood Pressure Support Ordered Normal Saline  Education / Care Plan  Dialysis Education Provided Yes  Documented Education in Care Plan Yes  Hemodialysis Catheter  Placement Date: 10/21/18    Site Condition No complications  Blue Lumen Status Blood return noted  Red  Lumen Status Blood return noted  Drainage Description None

## 2018-10-31 NOTE — Progress Notes (Signed)
[facility]  ROUNDING NOTE   Subjective:   Patient underwent hemodialysis successfully today.  1500 cc of fluid was removed Total of 13 L removed this admission    Feels well.  Still continues to have some cough and congestion Oxygen could not be weaned off  Objective:  Vital signs in last 24 hours:  Temp:  [98 F (36.7 C)-98.8 F (37.1 C)] 98.1 F (36.7 C) (02/20 1338) Pulse Rate:  [44-63] 54 (02/20 1338) Resp:  [16-21] 19 (02/20 1338) BP: (108-145)/(45-74) 145/51 (02/20 1338) SpO2:  [93 %-100 %] 99 % (02/20 1338) Weight:  [98.4 kg-100 kg] 100 kg (02/20 1335)  Weight change: 0.862 kg Filed Weights   10/31/18 0552 10/31/18 0945 10/31/18 1335  Weight: 98.4 kg 98.4 kg 100 kg    Intake/Output: No intake/output data recorded.   Intake/Output this shift:  Total I/O In: -  Out: 1500 [Other:1500]  Physical Exam: General: No acute distress  Head: Normocephalic, atraumatic. Moist oral mucosal membranes  Eyes: Anicteric  Neck: Supple,  Lungs:  Scattered rhonchi, normal effort, oxygen by nasal cannula  Heart: S1S2 no rubs, 2/6 murmur,  Abdomen:  Soft, PD catheter in place  Extremities: B/L BKA  Neurologic: Awake, alert, following commands  Skin: No lesions  Access:  [facility] permcath 2/12 Dr. Delana Meyer, PD catheter Dr. Delana Meyer 6/31    Basic Metabolic Panel: Recent Labs  Lab 10/25/18 0754 10/26/18 1129 10/27/18 0501 10/29/18 0540 10/30/18 0556  NA 139 136 136 135 137  K 3.5 3.8 3.6 4.1 4.0  CL 104 99 98 101 101  CO2 30 31 31 27 29   GLUCOSE 57* 143* 134* 179* 165*  BUN 44* 28* 17 18 18   CREATININE 2.06* 2.09* 2.03* 3.01* 3.08*  CALCIUM 8.1* 7.7* 7.9* 8.1* 8.2*  MG 2.3  --   --   --   --   PHOS  --  2.7  --   --   --     Liver Function Tests: Recent Labs  Lab 10/26/18 1129  ALBUMIN 3.2*   No results for input(s): LIPASE, AMYLASE in the last 168 hours. No results for input(s): AMMONIA in the last 168 hours.  CBC: Recent Labs  Lab 10/26/18 0332  10/27/18 0501 10/28/18 0555 10/29/18 0540 10/30/18 0556  WBC 44.5* 49.2* 48.0* 48.8* 44.8*  HGB 10.2* 10.2* 9.8* 10.1* 9.7*  HCT 34.2* 33.8* 32.8* 33.8* 32.4*  MCV 83.6 84.3 84.5 84.5 85.9  PLT 129* 123* 121* 128* 143*    Cardiac Enzymes: No results for input(s): CKTOTAL, CKMB, CKMBINDEX, TROPONINI in the last 168 hours.  BNP: Invalid input(s): POCBNP  CBG: Recent Labs  Lab 10/30/18 0809 10/30/18 1136 10/30/18 1652 10/30/18 2100 10/31/18 0739  GLUCAP 149* 168* 145* 168* 189*    Microbiology: Results for orders placed or performed during the hospital encounter of 10/16/18  Respiratory Panel by PCR     Status: Abnormal   Collection Time: 10/16/18  4:05 AM  Result Value Ref Range Status   Adenovirus NOT DETECTED NOT DETECTED Final   Coronavirus 229E NOT DETECTED NOT DETECTED Final    Comment: (NOTE) The Coronavirus on the Respiratory Panel, DOES NOT test for the novel  Coronavirus (2019 nCoV)    Coronavirus HKU1 NOT DETECTED NOT DETECTED Final   Coronavirus NL63 NOT DETECTED NOT DETECTED Final   Coronavirus OC43 NOT DETECTED NOT DETECTED Final   Metapneumovirus DETECTED (A) NOT DETECTED Final   Rhinovirus / Enterovirus NOT DETECTED NOT DETECTED Final   Influenza  A NOT DETECTED NOT DETECTED Final   Influenza B NOT DETECTED NOT DETECTED Final   Parainfluenza Virus 1 NOT DETECTED NOT DETECTED Final   Parainfluenza Virus 2 NOT DETECTED NOT DETECTED Final   Parainfluenza Virus 3 NOT DETECTED NOT DETECTED Final   Parainfluenza Virus 4 NOT DETECTED NOT DETECTED Final   Respiratory Syncytial Virus NOT DETECTED NOT DETECTED Final   Bordetella pertussis NOT DETECTED NOT DETECTED Final   Chlamydophila pneumoniae NOT DETECTED NOT DETECTED Final   Mycoplasma pneumoniae NOT DETECTED NOT DETECTED Final    Comment: Performed at [facility], [place] [address], [place], [place] [address]  Urine culture     Status: Abnormal   Collection Time: 10/16/18  4:23 AM  Result Value  Ref Range Status   Specimen Description URINE, CLEAN CATCH  Final   Special Requests   Final    NONE Performed at [facility], [address] [place], [place] [address]    Culture >=100,000 COLONIES/mL KLEBSIELLA PNEUMONIAE (A)  Final   Report Status 10/19/2018 FINAL  Final   Organism ID, Bacteria KLEBSIELLA PNEUMONIAE (A)  Final      Susceptibility   Klebsiella pneumoniae - MIC*    AMPICILLIN RESISTANT Resistant     CEFAZOLIN <=4 SENSITIVE Sensitive     CEFTRIAXONE <=1 SENSITIVE Sensitive     CIPROFLOXACIN <=0.25 SENSITIVE Sensitive     GENTAMICIN <=1 SENSITIVE Sensitive     IMIPENEM <=0.25 SENSITIVE Sensitive     NITROFURANTOIN <=16 SENSITIVE Sensitive     TRIMETH/SULFA <=20 SENSITIVE Sensitive     AMPICILLIN/SULBACTAM 4 SENSITIVE Sensitive     PIP/TAZO <=4 SENSITIVE Sensitive     Extended ESBL NEGATIVE Sensitive     * >=100,000 COLONIES/mL KLEBSIELLA PNEUMONIAE  MRSA PCR Screening     Status: Abnormal   Collection Time: 10/16/18  5:13 PM  Result Value Ref Range Status   MRSA by PCR POSITIVE (A) NEGATIVE Final    Comment:        The GeneXpert MRSA Assay (FDA approved for NASAL specimens only), is one component of a comprehensive MRSA colonization surveillance program. It is not intended to diagnose MRSA infection nor to guide or monitor treatment for MRSA infections. RESULT CALLED TO, READ BACK BY AND VERIFIED WITH: MAI QUIARORO 10/16/18 @ 1838  [place] Performed at [facility], [address] [place], [place] [address]   CULTURE, BLOOD (ROUTINE X 2) w Reflex to ID Panel     Status: None   Collection Time: 10/24/18 12:11 AM  Result Value Ref Range Status   Specimen Description BLOOD RIGHT ASSIST CONTROL  Final   Special Requests   Final    BOTTLES DRAWN AEROBIC AND ANAEROBIC Blood Culture adequate volume   Culture   Final    NO GROWTH 5 DAYS Performed at [facility], [address] [place], [place] [address]    Report Status  10/29/2018 FINAL  Final  CULTURE, BLOOD (ROUTINE X 2) w Reflex to ID Panel     Status: None   Collection Time: 10/24/18 12:17 AM  Result Value Ref Range Status   Specimen Description BLOOD RIGHT HAND  Final   Special Requests   Final    BOTTLES DRAWN AEROBIC AND ANAEROBIC Blood Culture adequate volume   Culture   Final    NO GROWTH 5 DAYS Performed at [facility], [address], [place], [place] [address]    Report Status 10/29/2018 FINAL  Final  Coagulation Studies: No results for input(s): LABPROT, INR in the last 72 hours.  Urinalysis: No results for input(s): COLORURINE, LABSPEC, PHURINE, GLUCOSEU, HGBUR, BILIRUBINUR, KETONESUR, PROTEINUR, UROBILINOGEN, NITRITE, LEUKOCYTESUR in the last 72 hours.  Invalid input(s): APPERANCEUR    Imaging: No results found.   Medications:   . sodium chloride Stopped (10/22/18 0104)  . clindamycin (CLEOCIN) IV     . albuterol  2.5 mg Nebulization TID  . amitriptyline  25 mg Oral QHS  . apixaban  5 mg Oral BID  . atorvastatin  40 mg Oral QHS  . budesonide (PULMICORT) nebulizer solution  0.5 mg Nebulization BID  . Chlorhexidine Gluconate Cloth  6 each Topical Q0600  . feeding supplement (NEPRO CARB STEADY)  237 mL Oral BID BM  . gabapentin  100 mg Oral BID  . Gerhardt's butt cream   Topical BID  . insulin aspart  0-15 Units Subcutaneous TID WC  . insulin aspart  0-5 Units Subcutaneous QHS  . insulin aspart  5 Units Subcutaneous TID WC  . insulin glargine  15 Units Subcutaneous BID  . isosorbide mononitrate  30 mg Oral Daily  . lactulose  20 g Oral BID  . multivitamin  1 tablet Oral QHS  . multivitamin-lutein  1 capsule Oral Daily  . pantoprazole  40 mg Oral QHS  . senna  1 tablet Oral Daily  . sertraline  100 mg Oral QHS  . sodium chloride flush  3 mL Intravenous Q12H  . torsemide  40 mg Oral Daily   sodium chloride, acetaminophen **OR** acetaminophen, guaiFENesin-dextromethorphan, hydrALAZINE, hydrOXYzine,  ipratropium, nitroGLYCERIN, ondansetron **OR** ondansetron (ZOFRAN) IV, traMADol  Assessment/ Plan:  [name] is a 73 y.o. [place] female with atrial fibrillation, hypertension, diabetes mellitus type II insulin dependent, diabetic neuropathy, congestive heart failure, coronary artery disease status post CABG, GERD, CLL, bilateral below the knee amputations admitted with cough, shortness of breath, UTI and initiated on hemodialysis this admission.   1.  End Stage Renal Disease with hyponatremia and uremia.  First dialysis 2/12 through permcath.  PD catheter placed by Dr. Delana Meyer of 2/14.  Outpatient planning for urgent start peritoneal dialysis to be started on discharge at [facility].   Tentatively scheduled for coming Monday Permcath to stay in place for back up hemodialysis as necessary.  Extra hemodialysis sessions for volume removal.  Total of 13 L removed so far this admission. Counseled patient and her daughter regarding following strict fluid restriction as patient is oliguric   2.  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  Urinary tract infection. Klebsiella -Completed antibiotic course  4. Diabetes mellitus type II with chronic kidney disease: insulin dependent. history of poor control.   5. Hypertension with chronic kidney disease:  - amlodipine discontinued by cardiologist.  Can consider ACE inhibitor or ARB as outpatient if blood pressure increases above 756 systolic. -Continue outpatient follow-up of bradycardia  6.  Cardiac history: Atrial fibrillation:  - transitioned to PO apixaban. -Amiodarone discontinued due to bradycardia Chronic systolic CHF with LVEF 35 to 40%, severe hypokinesis of septal, anteroseptal and anterior left ventricular segments, mildly dilated left atrium, moderate aortic sclerosis, mildly  elevated right ventricular systolic pressure of 43.3 mm.  Last echocardiogram October 11, 2018     LOS: [place] 2/20/20201:47 PM

## 2018-10-31 NOTE — Progress Notes (Signed)
HD Tx Start   10/31/18 0950  Vital Signs  Pulse Rate (!) 56  Resp 19  BP (!) 120/50  Oxygen Therapy  SpO2 98 %  During Hemodialysis Assessment  Blood Flow Rate (mL/min) 400 mL/min  Arterial Pressure (mmHg) -240 mmHg  Venous Pressure (mmHg) 160 mmHg  Transmembrane Pressure (mmHg) 40 mmHg  Ultrafiltration Rate (mL/min) 660 mL/min  Dialysate Flow Rate (mL/min) 600 ml/min  Conductivity: Machine  14  HD Safety Checks Performed Yes  Dialysis Fluid Bolus Normal Saline  Bolus Amount (mL) 250 mL  Intra-Hemodialysis Comments Tx initiated  Hemodialysis Catheter  Placement Date: 10/21/18    Site Condition No complications  Blue Lumen Status Infusing  Red Lumen Status Infusing  Drainage Description None

## 2018-10-31 NOTE — Progress Notes (Signed)
Pre HD Assessment   10/31/18 0945  Neurological  Level of Consciousness Alert  Orientation Level Oriented X4  Respiratory  Respiratory Pattern Regular;Unlabored  Chest Assessment Chest expansion symmetrical  Bilateral Breath Sounds Diminished;Rhonchi;Coarse crackles  Cardiac  Pulse Irregular  Cardiac Rhythm Atrial fibrillation  Antiarrhythmic device Yes  Vascular  R Radial Pulse +2  L Radial Pulse +2  Edema Generalized  Integumentary  Integumentary (WDL) X  Additional Integumentary Comments  (HD cath)  Musculoskeletal  Musculoskeletal (WDL) X  Generalized Weakness Yes  Urine Characteristics  Urine Color Yellow/straw  Psychosocial  Psychosocial (WDL) WDL

## 2018-10-31 NOTE — Progress Notes (Signed)
Pt discharged home with daughter at side. IV discontinued, telemetry box returned to Network engineer. Home oxygen was available at discharge.

## 2018-10-31 NOTE — Care Management Note (Addendum)
Case Management Note  Patient Details  Name: Caroline Hernandez MRN: [id number] Date of Birth: 02/25/1946  Subjective/Objective:      Patient discharging today with daughter.  She will be starting PD training on 11-01-18 at [facility].  Unsure of time yet.  Estill Bamberg will notify patient of time.      Action/Plan:  Tommi Rumps with [facility] is aware of patient discharge and will receive [place] services for RN, PT, SW, aide.  Also qualified for home oxygen.  Referral to [facility]; they will deliver today prior to DC.     Eliquis 30-day free coupon provided         Expected Discharge Date:  10/31/18               Expected Discharge Plan:  [place]  In-House Referral:  Clinical Social Work  Discharge planning Services  CM Consult  Post Acute Care Choice:    Choice offered to:  Patient  DME Arranged:  Oxygen DME Agency:  [place]:  RN, PT, Nurse's Aide, Social Work [place] Agency:  [place]  Status of Service:  Completed, signed off  If discussed at [employer] of Stay Meetings, dates discussed:    Additional Comments:  [name], RN 10/31/2018, 9:46 AM

## 2018-10-31 NOTE — Progress Notes (Signed)
Post HD Vitals   10/31/18 1256  Vital Signs  Pulse Rate (!) 52  Resp 16  BP (!) 129/54  Pt removed 153mL. MD aware of bradycardia.pt stable, ok to return to room.

## 2018-10-31 NOTE — Progress Notes (Signed)
Pre HD Assessment   10/31/18 0945  Neurological  Level of Consciousness Alert  Orientation Level Oriented X4  Respiratory  Respiratory Pattern Regular;Unlabored  Chest Assessment Chest expansion symmetrical  Bilateral Breath Sounds Diminished  Cardiac  Pulse Irregular  Cardiac Rhythm Atrial fibrillation  Antiarrhythmic device Yes  Vascular  R Radial Pulse +2  L Radial Pulse +2  Edema Generalized  Integumentary  Integumentary (WDL) X  Additional Integumentary Comments  (HD cath)  Musculoskeletal  Musculoskeletal (WDL) X  Generalized Weakness Yes  Urine Characteristics  Urine Color Yellow/straw  Psychosocial  Psychosocial (WDL) WDL

## 2018-10-31 NOTE — Progress Notes (Signed)
SATURATION QUALIFICATIONS: (This note is used to comply with regulatory documentation for home oxygen)  Patient Saturations on Room Air at Rest = 87%  Patient Saturations on Room Air while Ambulating = n/a%  Patient Saturations on 2 Liters of oxygen while resting 94%  Please briefly explain why patient needs home oxygen:

## 2018-10-31 NOTE — Clinical Social Work Note (Signed)
CSW received referral for SNF.  Case discussed with case manager and plan is to discharge home with home health.  CSW to sign off please re-consult if social work needs arise.  Marilou Barnfield R. Krysia Zahradnik, MSW, LCSW [phone]  

## 2018-10-31 NOTE — Discharge Instructions (Signed)
HH, outpatient PD.    Acute Bronchitis, Adult Acute bronchitis is when air tubes (bronchi) in the lungs suddenly get swollen. The condition can make it hard to breathe. It can also cause these symptoms:  A cough.  Coughing up clear, yellow, or green mucus.  Wheezing.  Chest congestion.  Shortness of breath.  A fever.  Body aches.  Chills.  A sore throat. Follow these instructions at home:  Medicines  Take over-the-counter and prescription medicines only as told by your doctor.  If you were prescribed an antibiotic medicine, take it as told by your doctor. Do not stop taking the antibiotic even if you start to feel better. General instructions  Rest.  Drink enough fluids to keep your pee (urine) pale yellow.  Avoid smoking and secondhand smoke. If you smoke and you need help quitting, ask your doctor. Quitting will help your lungs heal faster.  Use an inhaler, cool mist vaporizer, or humidifier as told by your doctor.  Keep all follow-up visits as told by your doctor. This is important. How is this prevented? To lower your risk of getting this condition again:  Wash your hands often with soap and water. If you cannot use soap and water, use hand sanitizer.  Avoid contact with people who have cold symptoms.  Try not to touch your hands to your mouth, nose, or eyes.  Make sure to get the flu shot every year. Contact a doctor if:  Your symptoms do not get better in 2 weeks. Get help right away if:  You cough up blood.  You have chest pain.  You have very bad shortness of breath.  You become dehydrated.  You faint (pass out) or keep feeling like you are going to pass out.  You keep throwing up (vomiting).  You have a very bad headache.  Your fever or chills gets worse. This information is not intended to replace advice given to you by your health care provider. Make sure you discuss any questions you have with your health care provider. Document  Released: 02/14/2008 Document Revised: 04/11/2017 Document Reviewed: 02/16/2016 Elsevier Interactive Patient Education  2019 [employer].

## 2018-11-01 ENCOUNTER — Other Ambulatory Visit: Payer: Self-pay | Admitting: Cardiovascular Disease

## 2018-11-01 ENCOUNTER — Other Ambulatory Visit: Payer: Self-pay

## 2018-11-01 MED ORDER — APIXABAN 5 MG PO TABS: 5.0000 mg | ORAL_TABLET | Freq: Two times a day (BID) | ORAL | 9 refills | Status: AC

## 2018-11-01 MED ORDER — APIXABAN 5 MG PO TABS: 5.0000 mg | ORAL_TABLET | Freq: Two times a day (BID) | ORAL | 9 refills | Status: DC

## 2018-11-01 NOTE — Telephone Encounter (Signed)
°*  STAT* If patient is at the pharmacy, call can be transferred to refill team.   1. Which medications need to be refilled? (please list name of each medication and dose if known)  Eliquis 5 mg po BID  2. Which pharmacy/location (including street and city if local pharmacy) is medication to be sent to? [facility] [address] [employer] in [place]   3. Do they need a 30 day or 90 day supply? 30  Out of meds

## 2018-11-01 NOTE — Telephone Encounter (Signed)
Patient son checking on status Contacted anticoag in [place] Stated refill will be sent in today Made patient aware to check with pharmacy shortly

## 2018-11-01 NOTE — Telephone Encounter (Signed)
Please review for refill on eliquis

## 2018-11-02 NOTE — Discharge Summary (Signed)
[facility] NAME: Frannie Shedrick    MR#:  [id number]  DATE OF BIRTH:  Mar 02, 1946  DATE OF ADMISSION:  10/16/2018   ADMITTING PHYSICIAN: Sedalia Muta, MD  DATE OF DISCHARGE: 10/31/2018  3:23 PM  PRIMARY CARE PHYSICIAN: System, Provider Not In   ADMISSION DIAGNOSIS:  Hypoxia [R09.02] Acute cystitis without hematuria [N30.00] Acute bronchitis, unspecified organism [J20.9] DISCHARGE DIAGNOSIS:  Active Problems:   Acute on chronic systolic heart failure (HCC)   Atrial fibrillation with slow ventricular response (HCC)   CHF (congestive heart failure) (HCC)   Pressure injury of skin   CLL (chronic lymphocytic leukemia) ([place])  SECONDARY DIAGNOSIS:   Past Medical History:  Diagnosis Date  . Amputation of left lower extremity below knee ([place])   . Amputation of right lower extremity below knee ([place])   . CAD (coronary artery disease)    a. 2004 Cardiac Arrest/CABG x 3 (LIMA->LAD, VG->OM, VG->RCA);  b. 06/2015 lexiscan MV: no significant ischemia, EF 48%, low risk->Med Rx.  . Chronic combined systolic and diastolic CHF (congestive heart failure) ([place])    a. 12/2014 Echo: EF 45-50%;  b. 08/2015 Echo: EF 45-50%, ant, antsept HK, mildly dil LA, nl RV, mild-mod TR, sev PAH (28mmHg); b. 08/2017 TEE: EF 40-45%, large PFO w/ L->R shunting.  . CKD (chronic kidney disease), stage IV ([place])   . CLL (chronic lymphocytic leukemia) ([place])   . Diabetes mellitus without complication ([place])   . Essential hypertension   . GERD (gastroesophageal reflux disease)   . Hiatal hernia   . History of cardiac arrest    a. 2004.  [place] Kitchen Hyperlipidemia   . Ischemic cardiomyopathy    a. s/p MDT ICD (originally had 3086 lead-->gen change and lead revision ~ 2012 @ [facility]); b. 12/2014 Echo: EF 45-50%;  c.08/2015 Echo: EF 45-50%; d. 08/2017 Echo: EF 40-45%.  Marland Kitchen PAD (peripheral artery disease) (HCC)    a. s/p bilat BKA  . Persistent atrial fibrillation    a. Dx 12/2014.   CHA2DS2VASc = 6--> warfarin;  b. 09/2015 s/p DCCV-->on amio; c. s/p DCCV 04/25/16; d. 07/2016 s/p RFCA/PVI in setting of recurrent afib despite amio; e. 05/2018 Recurrent afib.   HOSPITAL COURSE:  73 y.o. [place] female with atrial fibrillation, hypertension, diabetes mellitus type II insulin dependent, diabetic neuropathy, congestive heart failure, coronary artery disease status post CABG, GERD, CLL, bilateral below the knee amputations admitted with cough, shortness of breath, UTI and initiated on hemodialysis this admission.   * Acute bronchitis improved  * Acute respiratory failure due to acute on chronic systolic congestive heart failure, 35-40%.    * End Stage Renal Disease with hyponatremia and uremia.   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  No concerns for any overt progression of CLL at this time. f/by Onco at cancer center  * UTI-treated, urine shows klebsiella  * Atrial fibrillation, persistent - Amiodarone likely contributing to her bradycardia She seems relatively asymptomatic from the atrial fibrillation -Long discussion concerning anticoagulation options - should be a candidate for NOAC but would need to discuss ideal dosing per Cardio. Consider Eliquis as an outpt. Please note I have not started Eliquis and Coumadin is stopped so please call Cardio/PCP for consideration for anticoagulation -Would continue to hold carvedilol if rate continues to run low. Carvedilol can be restarted at a later date for rate control as amiodarone effect improves  * cardiomyopathy, ischemic Ejection fraction lower now 35 to  40%, 2018 was 40 to 45% Possibly depressed secondary to arrhythmia/atrial fibrillation Also underlying coronary disease, history of bypass Denies having anginal symptoms, no ischemic work-up  * PAD History of bilateral below the knee amputations  * CAD with stable angina Pacer, ICD  *Diabetes mellitus type 2.    *Leukocytosis,  history of CLL.  Generalized weakness.  The patient wants to go home with home health which is set up   Initially planned on EP eval tomorrow but dr Caryl Comes was on vacation so will have to be set up as an outpt.   DISCHARGE CONDITIONS:  stable CONSULTS OBTAINED:  Treatment Team:  Anthonette Legato, MD Tsosie Billing, MD Cammie Sickle, MD Minna Merritts, MD DRUG ALLERGIES:   Allergies  Allergen Reactions  . Piperacillin Other (See Comments)    Renal failure  . 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 See ID note**PER PT-CAUSED RENAL FAILURE**  . Zosyn [Piperacillin Sod-Tazobactam So] Other (See Comments)    Renal failure   DISCHARGE MEDICATIONS:   Allergies as of 10/31/2018      Reactions   Piperacillin Other (See Comments)   Renal failure   Piperacillin-tazobactam In Dex Other (See Comments)   Possible AIN in 2008??? See ID note**PER PT-CAUSED RENAL FAILURE**   Zosyn [piperacillin Sod-tazobactam So] Other (See Comments)   Renal failure      Medication List    STOP taking these medications   amiodarone 400 MG tablet Commonly known as:  PACERONE   carvedilol 3.125 MG tablet Commonly known as:  COREG   isosorbide mononitrate 30 MG 24 hr tablet Commonly known as:  IMDUR   losartan 50 MG tablet Commonly known as:  COZAAR   warfarin 4 MG tablet Commonly known as:  COUMADIN     TAKE these medications   albuterol 108 (90 Base) MCG/ACT inhaler Commonly known as:  PROVENTIL HFA;VENTOLIN HFA Inhale 2 puffs into the lungs every 6 (six) hours as needed for wheezing or shortness of breath.   amitriptyline 25 MG tablet Commonly known as:  ELAVIL Take 25 mg by mouth at bedtime.   amLODipine 10 MG tablet Commonly known as:  NORVASC TAKE 1 TABLET(10 MG) BY MOUTH DAILY   atorvastatin 40 MG tablet Commonly known as:  LIPITOR Take 40 mg by mouth at bedtime.   budesonide 0.5 MG/2ML nebulizer solution Commonly  known as:  PULMICORT Take 2 mLs (0.5 mg total) by nebulization 2 (two) times daily.   gabapentin 100 MG capsule Commonly known as:  NEURONTIN Take 100 mg by mouth 2 (two) times daily.   guaiFENesin-dextromethorphan 100-10 MG/5ML syrup Commonly known as:  ROBITUSSIN DM Take 5 mLs by mouth every 4 (four) hours as needed for cough.   HM VITAMIN D3 100 MCG (4000 UT) Caps Generic drug:  Cholecalciferol Take 4,000 Units by mouth daily.   insulin aspart 100 UNIT/ML injection Commonly known as:  novoLOG Inject 5-10 Units into the skin 3 (three) times daily before meals. 10 units into the skin before breakfast then 5 units before lunch then 10 units before supper (evening meal)   insulin glargine 100 UNIT/ML injection Commonly known as:  LANTUS Inject 0.15 mLs (15 Units total) into the skin 2 (two) times daily. What changed:    how much to take  when to take this  additional instructions   iron polysaccharides 150 MG capsule Commonly known as:  NIFEREX Take 150 mg by mouth daily.   nitroGLYCERIN 0.4  MG SL tablet Commonly known as:  NITROSTAT Place 1 tablet (0.4 mg total) under the tongue every 5 (five) minutes as needed for chest pain.   pantoprazole 40 MG tablet Commonly known as:  PROTONIX Take 40 mg by mouth at bedtime.   PREVIDENT 5000 DRY MOUTH 1.1 % Gel dental gel Generic drug:  sodium fluoride Place 1 application onto teeth daily.   sertraline 100 MG tablet Commonly known as:  ZOLOFT Take 100 mg by mouth at bedtime.   torsemide 20 MG tablet Commonly known as:  DEMADEX Take 2 tablets (40 mg total) by mouth 2 (two) times daily.   vitamin C 500 MG tablet Commonly known as:  ASCORBIC ACID Take 500 mg by mouth daily.   VITEYES AREDS FORMULA/LUTEIN Caps Take 1 capsule by mouth 2 (two) times daily.        DISCHARGE INSTRUCTIONS:   DIET:  Renal diet DISCHARGE CONDITION:  Stable ACTIVITY:  Activity as tolerated OXYGEN:  Home Oxygen: Yes.    Oxygen  Delivery: 2 liters via [place] DISCHARGE LOCATION:  Home with home health - [facility] is aware of patient discharge and will receive [facility] services for RN, PT, SW, aide.  Also qualified for home oxygen.  Referral to [facility]; they will deliver today prior to DC.     Eliquis 30-day free coupon provided. Consider 2 week protocol if appropriate to prevent readmission   If you experience worsening of your admission symptoms, develop shortness of breath, life threatening emergency, suicidal or homicidal thoughts you must seek medical attention immediately by calling 911 or calling your MD immediately  if symptoms less severe.  You Must read complete instructions/literature along with all the possible adverse reactions/side effects for all the Medicines you take and that have been prescribed to you. Take any new Medicines after you have completely understood and accpet all the possible adverse reactions/side effects.   Please note  You were cared for by a hospitalist during your hospital stay. If you have any questions about your discharge medications or the care you received while you were in the hospital after you are discharged, you can call the unit and asked to speak with the hospitalist on call if the hospitalist that took care of you is not available. Once you are discharged, your primary care physician will handle any further medical issues. Please note that NO REFILLS for any discharge medications will be authorized once you are discharged, as it is imperative that you return to your primary care physician (or establish a relationship with a primary care physician if you do not have one) for your aftercare needs so that they can reassess your need for medications and monitor your lab values.    On the day of Discharge:  VITAL SIGNS:  Blood pressure (!) 145/51, pulse (!) 54, temperature 98.1 F (36.7 C), temperature source Oral, resp. rate 19, height 5\' 8"  (1.727 m), weight 100 kg, SpO2 99 %. PHYSICAL  EXAMINATION:  GENERAL:  73 y.o.-year-old patient lying in the bed with no acute distress.  EYES: Pupils equal, round, reactive to light and accommodation. No scleral icterus. Extraocular muscles intact.  HEENT: Head atraumatic, normocephalic. Oropharynx and nasopharynx clear.  NECK:  Supple, no jugular venous distention. No thyroid enlargement, no tenderness.  LUNGS: Normal breath sounds bilaterally, no wheezing, rales,rhonchi or crepitation. No use of accessory muscles of respiration.  CARDIOVASCULAR: S1, S2 normal. No murmurs, rubs, or gallops.  ABDOMEN: Soft, non-tender, non-distended. Bowel sounds present. No organomegaly or mass.  EXTREMITIES: No pedal edema, cyanosis, or clubbing.  NEUROLOGIC: Cranial nerves II through XII are intact. Muscle strength 5/5 in all extremities. Sensation intact. Gait not checked.  PSYCHIATRIC: The patient is alert and oriented x 3.  SKIN: No obvious rash, lesion, or ulcer.  DATA REVIEW:   CBC Recent Labs  Lab 10/30/18 0556  WBC 44.8*  HGB 9.7*  HCT 32.4*  PLT 143*    Chemistries  Recent Labs  Lab 10/30/18 0556  NA 137  K 4.0  CL 101  CO2 29  GLUCOSE 165*  BUN 18  CREATININE 3.08*  CALCIUM 8.2*     Microbiology Results  Results for orders placed or performed during the hospital encounter of 10/16/18  Respiratory Panel by PCR     Status: Abnormal   Collection Time: 10/16/18  4:05 AM  Result Value Ref Range Status   Adenovirus NOT DETECTED NOT DETECTED Final   Coronavirus 229E NOT DETECTED NOT DETECTED Final    Comment: (NOTE) The Coronavirus on the Respiratory Panel, DOES NOT test for the novel  Coronavirus (2019 nCoV)    Coronavirus HKU1 NOT DETECTED NOT DETECTED Final   Coronavirus NL63 NOT DETECTED NOT DETECTED Final   Coronavirus OC43 NOT DETECTED NOT DETECTED Final   Metapneumovirus DETECTED (A) NOT DETECTED Final   Rhinovirus / Enterovirus NOT DETECTED NOT DETECTED Final   Influenza A NOT DETECTED NOT DETECTED Final    Influenza B NOT DETECTED NOT DETECTED Final   Parainfluenza Virus 1 NOT DETECTED NOT DETECTED Final   Parainfluenza Virus 2 NOT DETECTED NOT DETECTED Final   Parainfluenza Virus 3 NOT DETECTED NOT DETECTED Final   Parainfluenza Virus 4 NOT DETECTED NOT DETECTED Final   Respiratory Syncytial Virus NOT DETECTED NOT DETECTED Final   Bordetella pertussis NOT DETECTED NOT DETECTED Final   Chlamydophila pneumoniae NOT DETECTED NOT DETECTED Final   Mycoplasma pneumoniae NOT DETECTED NOT DETECTED Final    Comment: Performed at [facility], [place]. [address], [place], [place] [address]  Urine culture     Status: Abnormal   Collection Time: 10/16/18  4:23 AM  Result Value Ref Range Status   Specimen Description URINE, CLEAN CATCH  Final   Special Requests   Final    NONE Performed at [facility], [address] [place], [place] [address]    Culture >=100,000 COLONIES/mL KLEBSIELLA PNEUMONIAE (A)  Final   Report Status 10/19/2018 FINAL  Final   Organism ID, Bacteria KLEBSIELLA PNEUMONIAE (A)  Final      Susceptibility   Klebsiella pneumoniae - MIC*    AMPICILLIN RESISTANT Resistant     CEFAZOLIN <=4 SENSITIVE Sensitive     CEFTRIAXONE <=1 SENSITIVE Sensitive     CIPROFLOXACIN <=0.25 SENSITIVE Sensitive     GENTAMICIN <=1 SENSITIVE Sensitive     IMIPENEM <=0.25 SENSITIVE Sensitive     NITROFURANTOIN <=16 SENSITIVE Sensitive     TRIMETH/SULFA <=20 SENSITIVE Sensitive     AMPICILLIN/SULBACTAM 4 SENSITIVE Sensitive     PIP/TAZO <=4 SENSITIVE Sensitive     Extended ESBL NEGATIVE Sensitive     * >=100,000 COLONIES/mL KLEBSIELLA PNEUMONIAE  MRSA PCR Screening     Status: Abnormal   Collection Time: 10/16/18  5:13 PM  Result Value Ref Range Status   MRSA by PCR POSITIVE (A) NEGATIVE Final    Comment:        The GeneXpert MRSA Assay (FDA approved for NASAL specimens only), is one component of a comprehensive MRSA colonization surveillance program.  It is not intended to  diagnose MRSA infection nor to guide or monitor treatment for MRSA infections. RESULT CALLED TO, READ BACK BY AND VERIFIED WITH: MAI QUIARORO 10/16/18 @ 1838  [place] Performed at [facility], [address] [place], [place] [address]   CULTURE, BLOOD (ROUTINE X 2) w Reflex to ID Panel     Status: None   Collection Time: 10/24/18 12:11 AM  Result Value Ref Range Status   Specimen Description BLOOD RIGHT ASSIST CONTROL  Final   Special Requests   Final    BOTTLES DRAWN AEROBIC AND ANAEROBIC Blood Culture adequate volume   Culture   Final    NO GROWTH 5 DAYS Performed at [facility], [address] [place], [place] [address]    Report Status 10/29/2018 FINAL  Final  CULTURE, BLOOD (ROUTINE X 2) w Reflex to ID Panel     Status: None   Collection Time: 10/24/18 12:17 AM  Result Value Ref Range Status   Specimen Description BLOOD RIGHT HAND  Final   Special Requests   Final    BOTTLES DRAWN AEROBIC AND ANAEROBIC Blood Culture adequate volume   Culture   Final    NO GROWTH 5 DAYS Performed at [facility], [address] [place], [place] [address]    Report Status 10/29/2018 FINAL  Final   Follow-up Information    [place] On 10/31/2018.   Specialty:  Cardiology Why:  at 11:00am Contact information: [place] [phone]       Minna Merritts, MD. Go on 11/06/2018.   Specialty:  Cardiology Contact information: [place] [address] [phone]        Lavonia Dana, MD. Go on 11/18/2018.   Specialty:  Internal Medicine Why:  Apt time is at 1:30 pm Contact information: [address] [place] [address] [phone]        Delana Meyer, Dolores Lory, MD On 11/07/2018.   Specialties:  Vascular Surgery, Cardiology, Radiology, Vascular Surgery Why:  See midlevel. First post-op incision check. at  [place] information: [place] [address] [phone]          High risk for readmission  Management plans discussed with the patient, family and they are in agreement.  CODE STATUS: Prior   TOTAL TIME TAKING CARE OF THIS PATIENT: 45 minutes.    Max Sane M.D on 11/02/2018 at 8:00 PM  Between 7am to 6pm - Pager - [phone]  After 6pm go to [email or web address] - Proofreader  [facility]  Office  [phone]  CC: Primary care physician; System, Provider Not In   Note: This dictation was prepared with Dragon dictation along with smaller phrase technology. Any transcriptional errors that result from this process are unintentional.

## 2018-11-04 NOTE — Telephone Encounter (Signed)
H & Christ Lets regroup on this lady tomorrow so we can craft a united plan

## 2018-11-05 ENCOUNTER — Telehealth: Payer: Self-pay

## 2018-11-05 NOTE — Telephone Encounter (Signed)
Flagged on EMMI report for not having a follow up scheduled.  First attempt to reach patient made, however unable to reach patient.  Left voicemail encouraging callback. Will attempt at later time.

## 2018-11-05 NOTE — Telephone Encounter (Signed)
Dr. Caryl Comes spoke with Dr. Saunders Revel today about the patient. Decision made to see the patient is clinic on 2/27 as previously scheduled and will readdress possibly reprogramming her device/ further plan of care.

## 2018-11-06 ENCOUNTER — Ambulatory Visit: Admission: RE | Admit: 2018-11-06 | Payer: Medicare HMO | Source: Home / Self Care | Admitting: Cardiovascular Disease

## 2018-11-06 ENCOUNTER — Encounter: Admission: RE | Payer: Self-pay | Source: Home / Self Care

## 2018-11-06 SURGERY — CARDIOVERSION (CATH LAB)
Anesthesia: General

## 2018-11-06 NOTE — Progress Notes (Signed)
Patient Care Team: System, Provider Not In as PCP - General Gollan, Kathlene November, MD as PCP - Cardiology (Cardiology) Minna Merritts, MD as Consulting Physician (Cardiology)     HPI Caroline Hernandez is a 73 y.o. female seen in follow-up for ICD implanted for primary prevention in the setting of ischemic heart disease with prior bypass surgery In 2004.    She has a history of a prior ICD for cardiac arrest;  this occurred prior to her bypass. She then had a 6949-lead implanted in the end up with lead failure treated with insertion of a new lead and the abandoning of the old lead. This was done 2012. She has had no interval ICD discharges.  She has a history of atrial fibrillation initially diagnosed 2016. She underwent cardioversion 1/17. 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  She underwent catheter ablation by Dr. Greggory Brandy  2017 and felt considerably better until the recurrence of atrial fibrillation fall 2019 which has been persistent. .     Admitted a/c CHF and acute renal failure prompts ESRD  Bradycardia prompted discontinuation of amiodarone--   anticoagulated with warfarin then stopped.        Date Cr K TSH LFT WBC Hgb  6/19     52.8 11.8  8/19      37.6 9.6  1/20 2.93 5.2       2/20  3.08    44.8 9.7             DATE TEST EF   12/15 Echo   45-50 %   1/20 /Echo   35-40 % LAE mild           e   Thromboembolic risk factors ( age -38, HTN-1, DM-1, Vasc disease -1, CHF-1, Gender-1) for a CHADSVASc Score of 6  She was recently in the hospital after her last visit with me on 10/15/2018. She is in the process of setting up peritoneal dialysis but currently is on hemodialysis. She has been eating more and adds her breathing has gotten better and indeed is almost normal.   She does walk around at home, but is unable to walk for long. She bathes at home and does not have any issues with orthostatic lightheadedness.  However, she does report occasional dizziness when  standing up after hemodialysis.   She is sleeping relatively flat.  Feels better than she has in some time.     Past Medical History:  Diagnosis Date   Amputation of left lower extremity below knee ([place])    Amputation of right lower extremity below knee ([place])    CAD (coronary artery disease)    a. 2004 Cardiac Arrest/CABG x 3 (LIMA->LAD, VG->OM, VG->RCA);  b. 06/2015 lexiscan MV: no significant ischemia, EF 48%, low risk->Med Rx.   Chronic combined systolic and diastolic CHF (congestive heart failure) ([place])    a. 12/2014 Echo: EF 45-50%;  b. 08/2015 Echo: EF 45-50%, ant, antsept HK, mildly dil LA, nl RV, mild-mod TR, sev PAH (36mmHg); b. 08/2017 TEE: EF 40-45%, large PFO w/ L->R shunting.   CKD (chronic kidney disease), stage IV (HCC)    CLL (chronic lymphocytic leukemia) (HCC)    Diabetes mellitus without complication ([place])    Essential hypertension    GERD (gastroesophageal reflux disease)    Hiatal hernia    History of cardiac arrest    a. 2004.   Hyperlipidemia    Ischemic cardiomyopathy    a. s/p MDT  ICD (originally had 0258 lead-->gen change and lead revision ~ 2012 @ [place] [facility]); b. 12/2014 Echo: EF 45-50%;  c.08/2015 Echo: EF 45-50%; d. 08/2017 Echo: EF 40-45%.   PAD (peripheral artery disease) ([place])    a. s/p bilat BKA   Persistent atrial fibrillation    a. Dx 12/2014.  CHA2DS2VASc = 6--> warfarin;  b. 09/2015 s/p DCCV-->on amio; c. s/p DCCV 04/25/16; d. 07/2016 s/p RFCA/PVI in setting of recurrent afib despite amio; e. 05/2018 Recurrent afib.    Past Surgical History:  Procedure Laterality Date   ABDOMINAL HYSTERECTOMY     Amputation lower extremity bilaterally Bilateral    CAPD INSERTION N/A 10/25/2018   Procedure: LAPAROSCOPIC INSERTION CONTINUOUS AMBULATORY PERITONEAL DIALYSIS  (CAPD) CATHETER;  Surgeon: Katha Cabal, MD;  Location: [facility];  Service: Vascular;  Laterality: N/A;   CARDIAC CATHETERIZATION     CARDIOVERSION N/A 10/09/2016   Procedure:  CARDIOVERSION;  Surgeon: Lelon Perla, MD;  Location: [place];  Service: Cardiovascular;  Laterality: N/A;   CORONARY ARTERY BYPASS GRAFT     DIALYSIS/PERMA CATHETER INSERTION N/A 10/22/2018   Procedure: DIALYSIS/PERMA CATHETER INSERTION;  Surgeon: Katha Cabal, MD;  Location: [place] [facility];  Service: Cardiovascular;  Laterality: N/A;   ELECTROPHYSIOLOGIC STUDY N/A 10/07/2015   Procedure: CARDIOVERSION;  Surgeon: Minna Merritts, MD;  Location: [facility];  Service: Cardiovascular;  Laterality: N/A;   ELECTROPHYSIOLOGIC STUDY N/A 04/25/2016   Procedure: Cardioversion;  Surgeon: Minna Merritts, MD;  Location: [facility];  Service: Cardiovascular;  Laterality: N/A;   ELECTROPHYSIOLOGIC STUDY N/A 07/18/2016   Procedure: Atrial Fibrillation Ablation;  Surgeon: Thompson Grayer, MD;  Location: [place] [facility];  Service: Cardiovascular;  Laterality: N/A;   IMPLANTABLE CARDIOVERTER DEFIBRILLATOR IMPLANT  2005   Medtronic    TEE WITHOUT CARDIOVERSION N/A 07/18/2016   Procedure: TRANSESOPHAGEAL ECHOCARDIOGRAM (TEE);  Surgeon: Sueanne Margarita, MD;  Location: [facility] ENDOSCOPY;  Service: Cardiovascular;  Laterality: N/A;   TEE WITHOUT CARDIOVERSION N/A 10/09/2016   Procedure: TRANSESOPHAGEAL ECHOCARDIOGRAM (TEE);  Surgeon: Lelon Perla, MD;  Location: [facility] ENDOSCOPY;  Service: Cardiovascular;  Laterality: N/A;    Current Outpatient Medications  Medication Sig Dispense Refill   amitriptyline (ELAVIL) 25 MG tablet Take 25 mg by mouth at bedtime.   11   amLODipine (NORVASC) 10 MG tablet TAKE 1 TABLET(10 MG) BY MOUTH DAILY 90 tablet 0   apixaban (ELIQUIS) 5 MG TABS tablet Take 1 tablet (5 mg total) by mouth 2 (two) times daily. 60 tablet 9   atorvastatin (LIPITOR) 40 MG tablet Take 40 mg by mouth at bedtime.   11   Cholecalciferol (HM VITAMIN D3) 100 MCG (4000 UT) CAPS Take 4,000 Units by mouth daily.      gabapentin (NEURONTIN) 100 MG capsule Take 100 mg by mouth 2 (two) times  daily.     insulin aspart (NOVOLOG) 100 UNIT/ML injection Inject 5-10 Units into the skin 3 (three) times daily before meals. 10 units into the skin before breakfast then 5 units before lunch then 10 units before supper (evening meal)     insulin glargine (LANTUS) 100 UNIT/ML injection Inject 0.15 mLs (15 Units total) into the skin 2 (two) times daily. 10 mL 1   iron polysaccharides (NIFEREX) 150 MG capsule Take 150 mg by mouth daily.     isosorbide mononitrate (IMDUR) 30 MG 24 hr tablet TAKE 2 TABLETS(60 MG) BY MOUTH TWICE DAILY 360 tablet 1   Multiple Vitamins-Minerals (VITEYES AREDS FORMULA/LUTEIN) CAPS Take 1 capsule  by mouth 2 (two) times daily.     nitroGLYCERIN (NITROSTAT) 0.4 MG SL tablet Place 1 tablet (0.4 mg total) under the tongue every 5 (five) minutes as needed for chest pain. 30 tablet 0   pantoprazole (PROTONIX) 40 MG tablet Take 40 mg by mouth at bedtime.     PREVIDENT 5000 DRY MOUTH 1.1 % GEL dental gel Place 1 application onto teeth daily.   5   sertraline (ZOLOFT) 100 MG tablet Take 100 mg by mouth at bedtime.   11   torsemide (DEMADEX) 20 MG tablet Take 2 tablets (40 mg total) by mouth 2 (two) times daily. 30 tablet 3   vitamin C (ASCORBIC ACID) 500 MG tablet Take 500 mg by mouth daily.     No current facility-administered medications for this visit.     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 See ID note**PER PT-CAUSED RENAL FAILURE**   Zosyn [Piperacillin Sod-Tazobactam So] Other (See Comments)    Renal failure      Review of Systems negative except from HPI and PMH  Physical Exam BP 138/60 (BP Location: Left Arm, Patient Position: Sitting, Cuff Size: Normal)    Pulse (!) 59    Ht 5\' 8"  (1.727 m)    Wt 213 lb (96.6 kg)    BMI 32.39 kg/m  Well developed and nourished in no acute distress HENT normal Neck supple with  JVP-flat Carotids brisk and full without bruits Clear Irregularly irregular rate and rhythm with controlled ventricular response, no murmurs or gallops Abd-soft with active BS without hepatomegaly Bilateral BKA Skin-warm and dry A & Oriented  Grossly normal sensory and motor function  ECG Personally reviewed     Atrial fibrillation at 59 Interval-/12/47 Axis left -53 Nonspecific IVCD  Assessment and  Plan  Atrial Fibrillation-permanent  ICD  The patient's device was interrogated.  The information was reviewed. No changes were made in the programming.     Ischemic cardiomyopathy  Aborted Cardiac Arrest    CLL  Anemia  Chronic renal insufficiency now on PD  Sleep disorder breathing   Congestive heart failure  chronic/systolic/diastolic  Euvolemic continue current meds  Without symptoms of ischemia  No intercurrent Ventricular tachycardia  She is less volume overloaded since starting dialysis. She has since stopped amiodarone.   We reviewed the physiology of atrial fibrillation and discussed the therapeutic strategies of rate control versus rhythm control.  In general, quality of life measures and mortality measures are similar in the group of patients in whom ablation is not an option.  We discussed then, the specific role of rhythm control, including the issues of pro arrhythmia, and its relationship to ongoing symptoms despite efforts at rate control.  For now we will proceed with rate strategy.    Current medicines are reviewed at length with the patient today .  The patient does not  have concerns regarding medicines.  She is urinating some.  We will have her discuss with nephrology the role of ongoing diuretic therapy  CLL is followed at [facility].      More than 50% of 25 min was spent in counseling related to the above  I, Margit Banda am acting as a scribe for Virl Axe, M.D. I have reviewed the above documentation for accuracy and completeness, and  I agree with the above.    Signed, Virl Axe, MD  11/07/18 [place], [place]

## 2018-11-06 NOTE — Telephone Encounter (Signed)
Second attempt made, however unable to reach patient.  Left another voicemail encouraging callback for any questions or concerns.  Per chart review, patient does have a follow up with Dr. Caryl Comes tomorrow morning.

## 2018-11-07 ENCOUNTER — Encounter (INDEPENDENT_AMBULATORY_CARE_PROVIDER_SITE_OTHER): Payer: Self-pay | Admitting: Nurse Practitioner

## 2018-11-07 ENCOUNTER — Ambulatory Visit (INDEPENDENT_AMBULATORY_CARE_PROVIDER_SITE_OTHER): Payer: Medicare HMO | Admitting: Nurse Practitioner

## 2018-11-07 ENCOUNTER — Other Ambulatory Visit: Payer: Self-pay

## 2018-11-07 ENCOUNTER — Encounter: Payer: Self-pay | Admitting: Internal Medicine

## 2018-11-07 ENCOUNTER — Ambulatory Visit (INDEPENDENT_AMBULATORY_CARE_PROVIDER_SITE_OTHER): Payer: Medicare HMO | Admitting: Internal Medicine

## 2018-11-07 VITALS — BP 133/65 | HR 56 | Resp 14 | Ht 68.0 in | Wt 201.0 lb

## 2018-11-07 VITALS — BP 138/60 | HR 59 | Ht 68.0 in | Wt 213.0 lb

## 2018-11-07 DIAGNOSIS — I5043 Acute on chronic combined systolic (congestive) and diastolic (congestive) heart failure: Secondary | ICD-10-CM | POA: Diagnosis not present

## 2018-11-07 DIAGNOSIS — N184 Chronic kidney disease, stage 4 (severe): Secondary | ICD-10-CM | POA: Diagnosis not present

## 2018-11-07 DIAGNOSIS — I4819 Other persistent atrial fibrillation: Secondary | ICD-10-CM | POA: Diagnosis not present

## 2018-11-07 DIAGNOSIS — I25111 Atherosclerotic heart disease of native coronary artery with angina pectoris with documented spasm: Secondary | ICD-10-CM | POA: Diagnosis not present

## 2018-11-07 DIAGNOSIS — I129 Hypertensive chronic kidney disease with stage 1 through stage 4 chronic kidney disease, or unspecified chronic kidney disease: Secondary | ICD-10-CM | POA: Diagnosis not present

## 2018-11-07 DIAGNOSIS — I10 Essential (primary) hypertension: Secondary | ICD-10-CM

## 2018-11-07 DIAGNOSIS — Z79899 Other long term (current) drug therapy: Secondary | ICD-10-CM | POA: Diagnosis not present

## 2018-11-07 DIAGNOSIS — Z95810 Presence of automatic (implantable) cardiac defibrillator: Secondary | ICD-10-CM | POA: Diagnosis not present

## 2018-11-07 DIAGNOSIS — I255 Ischemic cardiomyopathy: Secondary | ICD-10-CM

## 2018-11-07 LAB — CUP PACEART INCLINIC DEVICE CHECK
Date Time Interrogation Session: 20200227171300
Implantable Lead Implant Date: 20120326
Implantable Lead Location: 753860
Implantable Lead Model: 6935
Implantable Pulse Generator Implant Date: 20120326

## 2018-11-07 NOTE — Patient Instructions (Signed)
Medication Instructions:  - Your physician recommends that you continue on your current medications as directed. Please refer to the Current Medication list given to you today.  If you need a refill on your cardiac medications before your next appointment, please call your pharmacy.   Lab work: - none ordered  If you have labs (blood work) drawn today and your tests are completely normal, you will receive your results only by: Marland Kitchen MyChart Message (if you have MyChart) OR . A paper copy in the mail If you have any lab test that is abnormal or we need to change your treatment, we will call you to review the results.  Testing/Procedures: - none ordered  Follow-Up: At [facility], you and your health needs are our priority.  As part of our continuing mission to provide you with exceptional heart care, we have created designated Provider Care Teams.  These Care Teams include your primary Cardiologist (physician) and Advanced Practice Providers (APPs -  Physician Assistants and Nurse Practitioners) who all work together to provide you with the care you need, when you need it. You will need a follow up appointment in 1 year with Dr. Caryl Comes.  Please call our office 2 months in advance to schedule this appointment.    Any Other Special Instructions Will Be Listed Below (If Applicable). - N/A

## 2018-11-12 MED ORDER — TORSEMIDE 20 MG PO TABS: 40.0000 mg | ORAL_TABLET | Freq: Two times a day (BID) | ORAL | 3 refills | Status: DC

## 2018-11-18 ENCOUNTER — Emergency Department: Payer: Medicare HMO

## 2018-11-18 ENCOUNTER — Inpatient Hospital Stay
Admission: EM | Admit: 2018-11-18 | Discharge: 2018-11-23 | DRG: 246 | Disposition: A | Discharge status: Home Health | Payer: Medicare HMO | Attending: Internal Medicine | Admitting: Internal Medicine

## 2018-11-18 ENCOUNTER — Other Ambulatory Visit: Payer: Self-pay

## 2018-11-18 ENCOUNTER — Encounter: Payer: Self-pay | Admitting: Intensive Care

## 2018-11-18 DIAGNOSIS — Z95810 Presence of automatic (implantable) cardiac defibrillator: Secondary | ICD-10-CM

## 2018-11-18 DIAGNOSIS — J9600 Acute respiratory failure, unspecified whether with hypoxia or hypercapnia: Secondary | ICD-10-CM

## 2018-11-18 DIAGNOSIS — E1151 Type 2 diabetes mellitus with diabetic peripheral angiopathy without gangrene: Secondary | ICD-10-CM | POA: Diagnosis present

## 2018-11-18 DIAGNOSIS — E1140 Type 2 diabetes mellitus with diabetic neuropathy, unspecified: Secondary | ICD-10-CM | POA: Diagnosis present

## 2018-11-18 DIAGNOSIS — I2584 Coronary atherosclerosis due to calcified coronary lesion: Secondary | ICD-10-CM | POA: Diagnosis present

## 2018-11-18 DIAGNOSIS — Z79899 Other long term (current) drug therapy: Secondary | ICD-10-CM

## 2018-11-18 DIAGNOSIS — R1011 Right upper quadrant pain: Secondary | ICD-10-CM | POA: Diagnosis present

## 2018-11-18 DIAGNOSIS — Z87440 Personal history of urinary (tract) infections: Secondary | ICD-10-CM

## 2018-11-18 DIAGNOSIS — I25710 Atherosclerosis of autologous vein coronary artery bypass graft(s) with unstable angina pectoris: Secondary | ICD-10-CM | POA: Diagnosis not present

## 2018-11-18 DIAGNOSIS — Z66 Do not resuscitate: Secondary | ICD-10-CM | POA: Diagnosis present

## 2018-11-18 DIAGNOSIS — I25718 Atherosclerosis of autologous vein coronary artery bypass graft(s) with other forms of angina pectoris: Secondary | ICD-10-CM | POA: Diagnosis present

## 2018-11-18 DIAGNOSIS — I214 Non-ST elevation (NSTEMI) myocardial infarction: Principal | ICD-10-CM | POA: Diagnosis present

## 2018-11-18 DIAGNOSIS — N186 End stage renal disease: Secondary | ICD-10-CM | POA: Diagnosis present

## 2018-11-18 DIAGNOSIS — R001 Bradycardia, unspecified: Secondary | ICD-10-CM | POA: Diagnosis present

## 2018-11-18 DIAGNOSIS — Z951 Presence of aortocoronary bypass graft: Secondary | ICD-10-CM | POA: Diagnosis not present

## 2018-11-18 DIAGNOSIS — I70201 Unspecified atherosclerosis of native arteries of extremities, right leg: Secondary | ICD-10-CM | POA: Diagnosis present

## 2018-11-18 DIAGNOSIS — J9601 Acute respiratory failure with hypoxia: Secondary | ICD-10-CM | POA: Diagnosis present

## 2018-11-18 DIAGNOSIS — Z880 Allergy status to penicillin: Secondary | ICD-10-CM

## 2018-11-18 DIAGNOSIS — I25118 Atherosclerotic heart disease of native coronary artery with other forms of angina pectoris: Secondary | ICD-10-CM | POA: Diagnosis present

## 2018-11-18 DIAGNOSIS — I132 Hypertensive heart and chronic kidney disease with heart failure and with stage 5 chronic kidney disease, or end stage renal disease: Secondary | ICD-10-CM | POA: Diagnosis present

## 2018-11-18 DIAGNOSIS — Z89512 Acquired absence of left leg below knee: Secondary | ICD-10-CM | POA: Diagnosis not present

## 2018-11-18 DIAGNOSIS — Z7901 Long term (current) use of anticoagulants: Secondary | ICD-10-CM

## 2018-11-18 DIAGNOSIS — I5043 Acute on chronic combined systolic (congestive) and diastolic (congestive) heart failure: Secondary | ICD-10-CM | POA: Diagnosis present

## 2018-11-18 DIAGNOSIS — E1122 Type 2 diabetes mellitus with diabetic chronic kidney disease: Secondary | ICD-10-CM | POA: Diagnosis present

## 2018-11-18 DIAGNOSIS — D631 Anemia in chronic kidney disease: Secondary | ICD-10-CM | POA: Diagnosis present

## 2018-11-18 DIAGNOSIS — E785 Hyperlipidemia, unspecified: Secondary | ICD-10-CM | POA: Diagnosis present

## 2018-11-18 DIAGNOSIS — N179 Acute kidney failure, unspecified: Secondary | ICD-10-CM | POA: Diagnosis present

## 2018-11-18 DIAGNOSIS — Z89511 Acquired absence of right leg below knee: Secondary | ICD-10-CM | POA: Diagnosis not present

## 2018-11-18 DIAGNOSIS — Z801 Family history of malignant neoplasm of trachea, bronchus and lung: Secondary | ICD-10-CM

## 2018-11-18 DIAGNOSIS — Z8674 Personal history of sudden cardiac arrest: Secondary | ICD-10-CM

## 2018-11-18 DIAGNOSIS — Z992 Dependence on renal dialysis: Secondary | ICD-10-CM

## 2018-11-18 DIAGNOSIS — Z833 Family history of diabetes mellitus: Secondary | ICD-10-CM

## 2018-11-18 DIAGNOSIS — K219 Gastro-esophageal reflux disease without esophagitis: Secondary | ICD-10-CM | POA: Diagnosis present

## 2018-11-18 DIAGNOSIS — C9110 Chronic lymphocytic leukemia of B-cell type not having achieved remission: Secondary | ICD-10-CM | POA: Diagnosis present

## 2018-11-18 DIAGNOSIS — D696 Thrombocytopenia, unspecified: Secondary | ICD-10-CM | POA: Diagnosis present

## 2018-11-18 DIAGNOSIS — J209 Acute bronchitis, unspecified: Secondary | ICD-10-CM | POA: Diagnosis present

## 2018-11-18 DIAGNOSIS — Z8249 Family history of ischemic heart disease and other diseases of the circulatory system: Secondary | ICD-10-CM

## 2018-11-18 DIAGNOSIS — Z794 Long term (current) use of insulin: Secondary | ICD-10-CM

## 2018-11-18 DIAGNOSIS — I4819 Other persistent atrial fibrillation: Secondary | ICD-10-CM | POA: Diagnosis present

## 2018-11-18 DIAGNOSIS — I255 Ischemic cardiomyopathy: Secondary | ICD-10-CM | POA: Diagnosis present

## 2018-11-18 DIAGNOSIS — Z955 Presence of coronary angioplasty implant and graft: Secondary | ICD-10-CM | POA: Diagnosis not present

## 2018-11-18 DIAGNOSIS — K449 Diaphragmatic hernia without obstruction or gangrene: Secondary | ICD-10-CM | POA: Diagnosis present

## 2018-11-18 LAB — BASIC METABOLIC PANEL
Anion gap: 13 (ref 5–15)
BUN: 29 mg/dL — ABNORMAL HIGH (ref 8–23)
CO2: 26 mmol/L (ref 22–32)
Calcium: 9.4 mg/dL (ref 8.9–10.3)
Chloride: 96 mmol/L — ABNORMAL LOW (ref 98–111)
Creatinine, Ser: 3.13 mg/dL — ABNORMAL HIGH (ref 0.44–1.00)
GFR calc Af Amer: 17 mL/min — ABNORMAL LOW (ref >60)
GFR calc non Af Amer: 14 mL/min — ABNORMAL LOW (ref >60)
Glucose, Bld: 126 mg/dL — ABNORMAL HIGH (ref 70–99)
Potassium: 4.7 mmol/L (ref 3.5–5.1)
Sodium: 135 mmol/L (ref 135–145)

## 2018-11-18 LAB — CBC
HCT: 35.6 % — ABNORMAL LOW (ref 36.0–46.0)
Hemoglobin: 10.9 g/dL — ABNORMAL LOW (ref 12.0–15.0)
MCH: 26.3 pg (ref 26.0–34.0)
MCHC: 30.6 g/dL (ref 30.0–36.0)
MCV: 85.8 fL (ref 80.0–100.0)
Platelets: 278 10*3/uL (ref 150–400)
RBC: 4.15 MIL/uL (ref 3.87–5.11)
RDW: 19.9 % — ABNORMAL HIGH (ref 11.5–15.5)
WBC: 46.1 10*3/uL — ABNORMAL HIGH (ref 4.0–10.5)
nRBC: 0 % (ref 0.0–0.2)

## 2018-11-18 LAB — INFLUENZA PANEL BY PCR (TYPE A & B)
Influenza A By PCR: NEGATIVE
Influenza B By PCR: NEGATIVE

## 2018-11-18 LAB — TROPONIN I
Troponin I: 1.44 ng/mL (ref <0.03)
Troponin I: 3.84 ng/mL (ref <0.03)

## 2018-11-18 LAB — MAGNESIUM: Magnesium: 2 mg/dL (ref 1.7–2.4)

## 2018-11-18 LAB — HEPARIN LEVEL (UNFRACTIONATED): Heparin Unfractionated: >3.6 IU/mL — ABNORMAL HIGH (ref 0.30–0.70)

## 2018-11-18 LAB — PROTIME-INR
INR: 1.5 — AB (ref 0.8–1.2)
PROTHROMBIN TIME: 18.2 s — AB (ref 11.4–15.2)

## 2018-11-18 LAB — APTT: aPTT: 43 seconds — ABNORMAL HIGH (ref 24–36)

## 2018-11-18 LAB — GLUCOSE, CAPILLARY: Glucose-Capillary: 144 mg/dL — ABNORMAL HIGH (ref 70–99)

## 2018-11-18 MED ORDER — SODIUM CHLORIDE 0.9 % IV SOLN: 250.0000 mL | INTRAVENOUS | Status: DC | PRN

## 2018-11-18 MED ORDER — ISOSORBIDE MONONITRATE ER 30 MG PO TB24
30.0000 mg | ORAL_TABLET | Freq: Every day | ORAL | Status: DC
  Administered 2018-11-20 – 2018-11-23 (×4): 30 mg via ORAL
  Filled 2018-11-18 (×5): qty 1

## 2018-11-18 MED ORDER — ONDANSETRON HCL 4 MG/2ML IJ SOLN
4.0000 mg | Freq: Once | INTRAMUSCULAR | Status: AC
  Administered 2018-11-18: 4 mg via INTRAVENOUS
  Filled 2018-11-18: qty 2

## 2018-11-18 MED ORDER — SODIUM CHLORIDE 0.9% FLUSH: 3.0000 mL | INTRAVENOUS | Status: DC | PRN

## 2018-11-18 MED ORDER — AMLODIPINE BESYLATE 10 MG PO TABS
10.0000 mg | ORAL_TABLET | Freq: Every day | ORAL | Status: DC
  Administered 2018-11-21: 10 mg via ORAL
  Filled 2018-11-18: qty 2
  Filled 2018-11-18 (×2): qty 1

## 2018-11-18 MED ORDER — INSULIN ASPART 100 UNIT/ML ~~LOC~~ SOLN
0.0000 [IU] | Freq: Three times a day (TID) | SUBCUTANEOUS | Status: DC
  Administered 2018-11-19: 1 [IU] via SUBCUTANEOUS
  Administered 2018-11-21 – 2018-11-22 (×2): 2 [IU] via SUBCUTANEOUS
  Filled 2018-11-18 (×3): qty 1

## 2018-11-18 MED ORDER — ZOLPIDEM TARTRATE 5 MG PO TABS
5.0000 mg | ORAL_TABLET | Freq: Every evening | ORAL | Status: DC | PRN
  Administered 2018-11-18: 5 mg via ORAL
  Filled 2018-11-18 (×2): qty 1

## 2018-11-18 MED ORDER — SODIUM CHLORIDE 0.9 % IV BOLUS
500.0000 mL | Freq: Once | INTRAVENOUS | Status: AC
  Administered 2018-11-18: 500 mL via INTRAVENOUS

## 2018-11-18 MED ORDER — NITROGLYCERIN 0.4 MG SL SUBL: 0.4000 mg | SUBLINGUAL_TABLET | SUBLINGUAL | Status: DC | PRN

## 2018-11-18 MED ORDER — HYDROXYZINE HCL 25 MG PO TABS
25.0000 mg | ORAL_TABLET | Freq: Every evening | ORAL | Status: DC | PRN
  Filled 2018-11-18: qty 1

## 2018-11-18 MED ORDER — ASPIRIN 300 MG RE SUPP: 300.0000 mg | RECTAL | Status: AC

## 2018-11-18 MED ORDER — INSULIN ASPART 100 UNIT/ML ~~LOC~~ SOLN: 0.0000 [IU] | Freq: Every day | SUBCUTANEOUS | Status: DC

## 2018-11-18 MED ORDER — SERTRALINE HCL 50 MG PO TABS
100.0000 mg | ORAL_TABLET | Freq: Every day | ORAL | Status: DC
  Administered 2018-11-18 – 2018-11-22 (×5): 100 mg via ORAL
  Filled 2018-11-18 (×5): qty 2

## 2018-11-18 MED ORDER — SODIUM CHLORIDE 0.9% FLUSH: 3.0000 mL | Freq: Once | INTRAVENOUS | Status: DC

## 2018-11-18 MED ORDER — INSULIN GLARGINE 100 UNIT/ML ~~LOC~~ SOLN
15.0000 [IU] | Freq: Two times a day (BID) | SUBCUTANEOUS | Status: DC
  Administered 2018-11-18 – 2018-11-23 (×9): 15 [IU] via SUBCUTANEOUS
  Filled 2018-11-18 (×15): qty 0.15

## 2018-11-18 MED ORDER — TORSEMIDE 20 MG PO TABS: 40.0000 mg | ORAL_TABLET | Freq: Two times a day (BID) | ORAL | Status: DC

## 2018-11-18 MED ORDER — ATORVASTATIN CALCIUM 20 MG PO TABS
40.0000 mg | ORAL_TABLET | Freq: Every day | ORAL | Status: DC
  Administered 2018-11-19 – 2018-11-22 (×3): 40 mg via ORAL
  Filled 2018-11-18 (×3): qty 2

## 2018-11-18 MED ORDER — ASPIRIN 81 MG PO CHEW
324.0000 mg | CHEWABLE_TABLET | ORAL | Status: AC
  Administered 2018-11-18: 324 mg via ORAL
  Filled 2018-11-18: qty 4

## 2018-11-18 MED ORDER — POLYSACCHARIDE IRON COMPLEX 150 MG PO CAPS
150.0000 mg | ORAL_CAPSULE | Freq: Every day | ORAL | Status: DC
  Administered 2018-11-19 – 2018-11-23 (×5): 150 mg via ORAL
  Filled 2018-11-18 (×6): qty 1

## 2018-11-18 MED ORDER — AMITRIPTYLINE HCL 25 MG PO TABS
25.0000 mg | ORAL_TABLET | Freq: Every day | ORAL | Status: DC
  Administered 2018-11-18 – 2018-11-22 (×5): 25 mg via ORAL
  Filled 2018-11-18: qty 1
  Filled 2018-11-18 (×2): qty 0.5
  Filled 2018-11-18 (×5): qty 1

## 2018-11-18 MED ORDER — METOPROLOL TARTRATE 25 MG PO TABS
12.5000 mg | ORAL_TABLET | Freq: Two times a day (BID) | ORAL | Status: DC
  Administered 2018-11-19 – 2018-11-23 (×8): 12.5 mg via ORAL
  Filled 2018-11-18 (×10): qty 1

## 2018-11-18 MED ORDER — SODIUM CHLORIDE 0.9% FLUSH
3.0000 mL | Freq: Two times a day (BID) | INTRAVENOUS | Status: DC
  Administered 2018-11-18 – 2018-11-20 (×3): 3 mL via INTRAVENOUS

## 2018-11-18 MED ORDER — HEPARIN (PORCINE) 25000 UT/250ML-% IV SOLN
1000.0000 [IU]/h | INTRAVENOUS | Status: DC
  Administered 2018-11-18: 1100 [IU]/h via INTRAVENOUS
  Filled 2018-11-18 (×2): qty 250

## 2018-11-18 MED ORDER — HEPARIN BOLUS VIA INFUSION
4000.0000 [IU] | Freq: Once | INTRAVENOUS | Status: DC
  Filled 2018-11-18: qty 4000

## 2018-11-18 MED ORDER — PANTOPRAZOLE SODIUM 40 MG PO TBEC
40.0000 mg | DELAYED_RELEASE_TABLET | Freq: Every day | ORAL | Status: DC
  Administered 2018-11-18 – 2018-11-22 (×5): 40 mg via ORAL
  Filled 2018-11-18 (×5): qty 1

## 2018-11-18 MED ORDER — ALPRAZOLAM 0.25 MG PO TABS
0.2500 mg | ORAL_TABLET | Freq: Two times a day (BID) | ORAL | Status: DC | PRN
  Administered 2018-11-19 – 2018-11-22 (×4): 0.25 mg via ORAL
  Filled 2018-11-18 (×4): qty 1

## 2018-11-18 MED ORDER — ONDANSETRON HCL 4 MG/2ML IJ SOLN
4.0000 mg | Freq: Four times a day (QID) | INTRAMUSCULAR | Status: DC | PRN
  Administered 2018-11-19 – 2018-11-20 (×3): 4 mg via INTRAVENOUS
  Filled 2018-11-18 (×4): qty 2

## 2018-11-18 MED ORDER — GUAIFENESIN-DM 100-10 MG/5ML PO SYRP
5.0000 mL | ORAL_SOLUTION | ORAL | Status: DC | PRN
  Filled 2018-11-18: qty 5

## 2018-11-18 MED ORDER — ASPIRIN EC 81 MG PO TBEC
81.0000 mg | DELAYED_RELEASE_TABLET | Freq: Every day | ORAL | Status: DC
  Administered 2018-11-19 – 2018-11-23 (×4): 81 mg via ORAL
  Filled 2018-11-18 (×6): qty 1

## 2018-11-18 MED ORDER — ALBUTEROL SULFATE (2.5 MG/3ML) 0.083% IN NEBU
2.5000 mg | INHALATION_SOLUTION | Freq: Once | RESPIRATORY_TRACT | Status: AC
  Administered 2018-11-18: 2.5 mg via RESPIRATORY_TRACT
  Filled 2018-11-18: qty 3

## 2018-11-18 MED ORDER — GABAPENTIN 100 MG PO CAPS
100.0000 mg | ORAL_CAPSULE | Freq: Two times a day (BID) | ORAL | Status: DC
  Administered 2018-11-18 – 2018-11-23 (×10): 100 mg via ORAL
  Filled 2018-11-18 (×10): qty 1

## 2018-11-18 MED ORDER — ACETAMINOPHEN 325 MG PO TABS
650.0000 mg | ORAL_TABLET | ORAL | Status: DC | PRN
  Administered 2018-11-18: 650 mg via ORAL
  Filled 2018-11-18: qty 2

## 2018-11-18 NOTE — ED Notes (Addendum)
.. ED TO INPATIENT HANDOFF REPORT  ED Nurse Name and Phone #: Deneise Lever [phone]  S Name/Age/Gender Caroline Hernandez 73 y.o. female Room/Bed: ED25A/ED25A  Code Status   Code Status: Prior  Home/SNF/Other Home Patient oriented to: self, place, time and situation Is this baseline? Yes   Triage Complete: Triage complete  Chief Complaint Shoulder Pain; Chest Pain  Triage Note Presents with Left sided aching chest pain and upset stomach/vomiting yesterday. Denies diarrhea. Chest pain radiates to Right shoulder and into back. Patient last had dialysis on Friday. Has been getting trained to do peritoneal dialysis at home but has not been cleared yet   Allergies Allergies  Allergen Reactions  . Piperacillin Other (See Comments)    Renal failure  . 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 See ID note**PER PT-CAUSED RENAL FAILURE**  . Zosyn [Piperacillin Sod-Tazobactam So] Other (See Comments)    Renal failure    Level of Care/Admitting Diagnosis ED Disposition    ED Disposition Condition Comment   Admit  Hospital Area: [place] [[id number]]  Level of Care: Telemetry [5]  Diagnosis: NSTEMI (non-ST elevated myocardial infarction) [facility]) [[id number]]  Admitting Physician: Demetrios Loll [[id number]]  Attending Physician: Demetrios Loll [[id number]]  Estimated length of stay: past midnight tomorrow  Certification:: I certify this patient will need inpatient services for at least 2 midnights  PT Class (Do Not Modify): Inpatient [101]  PT Acc Code (Do Not Modify): Private [1]       B Medical/Surgery History Past Medical History:  Diagnosis Date  . Amputation of left lower extremity below knee ([place])   . Amputation of right lower extremity below knee ([place])   . CAD (coronary artery disease)    a. 2004 Cardiac Arrest/CABG x 3 (LIMA->LAD, VG->OM, VG->RCA);  b. 06/2015 lexiscan MV: no significant ischemia, EF 48%, low risk->Med Rx.  . Chronic  combined systolic and diastolic CHF (congestive heart failure) ([place])    a. 12/2014 Echo: EF 45-50%;  b. 08/2015 Echo: EF 45-50%, ant, antsept HK, mildly dil LA, nl RV, mild-mod TR, sev PAH (2mmHg); b. 08/2017 TEE: EF 40-45%, large PFO w/ L->R shunting.  . CKD (chronic kidney disease), stage IV ([place])   . CLL (chronic lymphocytic leukemia) ([place])   . Diabetes mellitus without complication ([place])   . Essential hypertension   . GERD (gastroesophageal reflux disease)   . Hiatal hernia   . History of cardiac arrest    a. 2004.  Marland Kitchen Hyperlipidemia   . Ischemic cardiomyopathy    a. s/p MDT ICD (originally had 9675 lead-->gen change and lead revision ~ 2012 @ [place] [facility]); b. 12/2014 Echo: EF 45-50%;  c.08/2015 Echo: EF 45-50%; d. 08/2017 Echo: EF 40-45%.  Marland Kitchen PAD (peripheral artery disease) (HCC)    a. s/p bilat BKA  . Persistent atrial fibrillation    a. Dx 12/2014.  CHA2DS2VASc = 6--> warfarin;  b. 09/2015 s/p DCCV-->on amio; c. s/p DCCV 04/25/16; d. 07/2016 s/p RFCA/PVI in setting of recurrent afib despite amio; e. 05/2018 Recurrent afib.   Past Surgical History:  Procedure Laterality Date  . ABDOMINAL HYSTERECTOMY    . Amputation lower extremity bilaterally Bilateral   . CAPD INSERTION N/A 10/25/2018   Procedure: LAPAROSCOPIC INSERTION CONTINUOUS AMBULATORY PERITONEAL DIALYSIS  (CAPD) CATHETER;  Surgeon: Katha Cabal, MD;  Location: [facility];  Service: Vascular;  Laterality: N/A;  . CARDIAC CATHETERIZATION    . CARDIOVERSION N/A 10/09/2016   Procedure: CARDIOVERSION;  Surgeon: Denice Bors  Stanford Breed, MD;  Location: [place];  Service: Cardiovascular;  Laterality: N/A;  . CORONARY ARTERY BYPASS GRAFT    . DIALYSIS/PERMA CATHETER INSERTION N/A 10/22/2018   Procedure: DIALYSIS/PERMA CATHETER INSERTION;  Surgeon: Katha Cabal, MD;  Location: [place] [facility];  Service: Cardiovascular;  Laterality: N/A;  . ELECTROPHYSIOLOGIC STUDY N/A 10/07/2015   Procedure: CARDIOVERSION;  Surgeon: Minna Merritts,  MD;  Location: [facility];  Service: Cardiovascular;  Laterality: N/A;  . ELECTROPHYSIOLOGIC STUDY N/A 04/25/2016   Procedure: Cardioversion;  Surgeon: Minna Merritts, MD;  Location: [facility];  Service: Cardiovascular;  Laterality: N/A;  . ELECTROPHYSIOLOGIC STUDY N/A 07/18/2016   Procedure: Atrial Fibrillation Ablation;  Surgeon: Thompson Grayer, MD;  Location: [place] [facility];  Service: Cardiovascular;  Laterality: N/A;  . IMPLANTABLE CARDIOVERTER DEFIBRILLATOR IMPLANT  2005   Medtronic   . TEE WITHOUT CARDIOVERSION N/A 07/18/2016   Procedure: TRANSESOPHAGEAL ECHOCARDIOGRAM (TEE);  Surgeon: Sueanne Margarita, MD;  Location: [facility] ENDOSCOPY;  Service: Cardiovascular;  Laterality: N/A;  . TEE WITHOUT CARDIOVERSION N/A 10/09/2016   Procedure: TRANSESOPHAGEAL ECHOCARDIOGRAM (TEE);  Surgeon: Lelon Perla, MD;  Location: [facility] ENDOSCOPY;  Service: Cardiovascular;  Laterality: N/A;     A IV Location/Drains/Wounds Patient Lines/Drains/Airways Status   Active Line/Drains/Airways    Name:   Placement date:   Placement time:   Site:   Days:   Peripheral IV   -    -    -      Peripheral IV 11/18/18 Right Hand   11/18/18    1835    Hand   less than 1   Peripheral IV 11/18/18 Right;Anterior Forearm   11/18/18    2040    Forearm   less than 1   CVC Double Lumen 10/22/18 Right Internal jugular 19 cm   10/22/18    2009     27   Hemodialysis Catheter   10/21/18    -    -   28   External Urinary Catheter   10/16/18    0641    -   33   Incision (Closed) 10/25/18 Abdomen Other (Comment)   10/25/18    1344     24   Incision - 2 Ports Abdomen Umbilicus Lower;Medial   10/25/18    1350     24   Pressure Injury 10/16/18 Stage I -  Intact skin with non-blanchable redness of a localized area usually over a bony prominence.   10/16/18    1700     33   Pressure Injury 10/16/18 Stage I -  Intact skin with non-blanchable redness of a localized area usually over a bony prominence. on both stumps   10/16/18    1700     33           Intake/Output Last 24 hours No intake or output data in the 24 hours ending 11/18/18 2115  Labs/Imaging Results for orders placed or performed during the hospital encounter of 11/18/18 (from the past 48 hour(s))  Basic metabolic panel     Status: Abnormal   Collection Time: 11/18/18  5:22 PM  Result Value Ref Range   Sodium 135 135 - 145 mmol/L   Potassium 4.7 3.5 - 5.1 mmol/L   Chloride 96 (L) 98 - 111 mmol/L   CO2 26 22 - 32 mmol/L   Glucose, Bld 126 (H) 70 - 99 mg/dL   BUN 29 (H) 8 - 23 mg/dL   Creatinine, Ser 3.13 (H) 0.44 -  1.00 mg/dL   Calcium 9.4 8.9 - 10.3 mg/dL   GFR calc non Af Amer 14 (L) >60 mL/min   GFR calc Af Amer 17 (L) >60 mL/min   Anion gap 13 5 - 15    Comment: Performed at [facility], [address] [place], [place] [address]  CBC     Status: Abnormal   Collection Time: 11/18/18  5:22 PM  Result Value Ref Range   WBC 46.1 (H) 4.0 - 10.5 K/uL   RBC 4.15 3.87 - 5.11 MIL/uL   Hemoglobin 10.9 (L) 12.0 - 15.0 g/dL   HCT 35.6 (L) 36.0 - 46.0 %   MCV 85.8 80.0 - 100.0 fL   MCH 26.3 26.0 - 34.0 pg   MCHC 30.6 30.0 - 36.0 g/dL   RDW 19.9 (H) 11.5 - 15.5 %   Platelets 278 150 - 400 K/uL   nRBC 0.0 0.0 - 0.2 %    Comment: Performed at [facility], [address] [place], [place] [address]  Troponin I - ONCE - STAT     Status: Abnormal   Collection Time: 11/18/18  5:22 PM  Result Value Ref Range   Troponin I 1.44 (HH) <0.03 ng/mL    Comment: CRITICAL RESULT CALLED TO, READ BACK BY AND VERIFIED WITH ALLY RILEY @1750  11/18/18 AKT Performed at [facility], [address] [place], [place] [address]   Influenza panel by PCR (type A & B)     Status: None   Collection Time: 11/18/18  5:22 PM  Result Value Ref Range   Influenza A By PCR NEGATIVE NEGATIVE   Influenza B By PCR NEGATIVE NEGATIVE    Comment: (NOTE) The Xpert Xpress Flu assay is intended as an aid in the diagnosis of  influenza and should not be used as a sole  basis for treatment.  This  assay is FDA approved for nasopharyngeal swab specimens only. Nasal  washings and aspirates are unacceptable for Xpert Xpress Flu testing. Performed at [facility], [address] [place], [place] [address]   Protime-INR     Status: Abnormal   Collection Time: 11/18/18  7:27 PM  Result Value Ref Range   Prothrombin Time 18.2 (H) 11.4 - 15.2 seconds   INR 1.5 (H) 0.8 - 1.2    Comment: (NOTE) INR goal varies based on device and disease states. Performed at [facility], [address] [place], [place] [address]   APTT     Status: Abnormal   Collection Time: 11/18/18  7:27 PM  Result Value Ref Range   aPTT 43 (H) 24 - 36 seconds    Comment:        IF BASELINE aPTT IS ELEVATED, SUGGEST PATIENT RISK ASSESSMENT BE USED TO DETERMINE APPROPRIATE ANTICOAGULANT THERAPY. Performed at [facility], [place], [place] [address]   Heparin level (unfractionated)     Status: Abnormal   Collection Time: 11/18/18  7:27 PM  Result Value Ref Range   Heparin Unfractionated >3.60 (H) 0.30 - 0.70 IU/mL    Comment: RESULTS CONFIRMED BY MANUAL DILUTION (NOTE) If heparin results are below expected values, and patient dosage has  been confirmed, suggest follow up testing of antithrombin III levels. Performed at [facility], [address], [place],  [address]    Dg Chest 2 View  Result Date: 11/18/2018 CLINICAL DATA:  Chest pain. EXAM: CHEST - 2 VIEW COMPARISON:  October 29, 2018 FINDINGS: Stable right dialysis catheter.  The heart, hila, mediastinum, and left AICD device are stable. No overt edema. Mild atelectasis in the bases. IMPRESSION: No active cardiopulmonary disease. Electronically Signed   By: Dorise Bullion III M.D   On: 11/18/2018 17:35   US Abdomen Limited Ruq  Result Date: 11/18/2018 CLINICAL DATA:  Right upper quadrant pain EXAM: ULTRASOUND ABDOMEN LIMITED RIGHT UPPER QUADRANT COMPARISON:   None. FINDINGS: Gallbladder: 1or 2 small stones measuring up to 6 mm seen in the gallbladder without wall thickening, pericholecystic fluid, or Murphy's sign. Common bile duct: Diameter: 1.1 mm Liver: No focal lesion identified. Within normal limits in parenchymal echogenicity. Portal vein is patent on color Doppler imaging with normal direction of blood flow towards the liver. IMPRESSION: Cholelithiasis without wall thickening, pericholecystic fluid, or Murphy's sign. Electronically Signed   By: Dorise Bullion III M.D   On: 11/18/2018 19:06    Pending Labs Unresulted Labs (From admission, onward)    Start     Ordered   11/19/18 0400  APTT  Once-Timed,   STAT     11/18/18 2004   11/19/18 0400  Heparin level (unfractionated)  Once-Timed,   STAT     11/18/18 2004   11/18/18 1755  Urinalysis, Complete w Microscopic  ONCE - STAT,   STAT     11/18/18 1754   Signed and Held  Troponin I - Now Then Q6H  Now then every 6 hours,   STAT     Signed and Held   Signed and Held  Lipid panel  Tomorrow morning,   R     Signed and Held   Signed and Held  Magnesium  Add-on,   R     Signed and Held          Vitals/Pain Today's Vitals   11/18/18 1800 11/18/18 1930 11/18/18 1939 11/18/18 2000  BP: 118/87 (!) 132/56  119/77  Pulse: (!) 101 (!) 106  99  Resp: (!) 27 (!) 27  (!) 22  Temp:      TempSrc:      SpO2: 91% 94%  90%  Weight:      Height:      PainSc:   0-No pain     Isolation Precautions Droplet precaution  Medications Medications  sodium chloride flush (NS) 0.9 % injection 3 mL (has no administration in time range)  heparin ADULT infusion 100 units/mL (25000 units/259mL sodium chloride 0.45%) (1,100 Units/hr Intravenous New Bag/Given 11/18/18 2001)  sodium chloride 0.9 % bolus 500 mL (500 mLs Intravenous New Bag/Given 11/18/18 1802)  albuterol (PROVENTIL) (2.5 MG/3ML) 0.083% nebulizer solution 2.5 mg (2.5 mg Nebulization Given 11/18/18 1759)  ondansetron (ZOFRAN) injection 4 mg (4 mg  Intravenous Given 11/18/18 1954)    Mobility non-ambulatory  High fall risk   Focused Assessments    R Recommendations: See Admitting Provider Note  Report given to: Di'andra,RN  Additional Notes:  Peritoneal dialysis pt 7 days a week. Pt aware that urine sample is needed, will notify RN when pt has to use bathroom.

## 2018-11-18 NOTE — H&P (Addendum)
[facility] [place]: Caroline Hernandez    MR#:  [id number]  DATE OF BIRTH:  08-Jul-1946  DATE OF ADMISSION:  11/18/2018  PRIMARY CARE PHYSICIAN: Apolonio Schneiders, MD   REQUESTING/REFERRING PHYSICIAN: Arta Silence, MD  CHIEF COMPLAINT:   Chief Complaint  Patient presents with  . Chest Pain   Left chest pain a couple days HISTORY OF PRESENT ILLNESS:  Caroline Hernandez  is a 73 y.o. female with a known history of CAD, chronic combined systolic and diastolic CHF with ejection fraction 35-40%, hypertension, diabetes, CKD on PD, cardiac arrest, A. fib, PAD and ischemic cardiomyopathy etc.  The patient presented to ED with chest pain and shortness of breath over the past few days.  Chest pain is mainly on right side, intermittent and sharp with radiation to the right shoulder and the back the patient also complains of cough, nausea and vomiting.  She is found hypoxia and put on oxygen by nasal cannula 2 L in the ED.  Troponin is elevated at 1.44.  Chest x-ray is unremarkable.  ED physician started heparin drip for ACS. PAST MEDICAL HISTORY:   Past Medical History:  Diagnosis Date  . Amputation of left lower extremity below knee ([place])   . Amputation of right lower extremity below knee ([place])   . CAD (coronary artery disease)    a. 2004 Cardiac Arrest/CABG x 3 (LIMA->LAD, VG->OM, VG->RCA);  b. 06/2015 lexiscan MV: no significant ischemia, EF 48%, low risk->Med Rx.  . Chronic combined systolic and diastolic CHF (congestive heart failure) ([place])    a. 12/2014 Echo: EF 45-50%;  b. 08/2015 Echo: EF 45-50%, ant, antsept HK, mildly dil LA, nl RV, mild-mod TR, sev PAH (62mmHg); b. 08/2017 TEE: EF 40-45%, large PFO w/ L->R shunting.  . CKD (chronic kidney disease), stage IV ([place])   . CLL (chronic lymphocytic leukemia) ([place])   . Diabetes mellitus without complication ([place])   . Essential hypertension   . GERD (gastroesophageal reflux disease)   . Hiatal  hernia   . History of cardiac arrest    a. 2004.  Marland Kitchen Hyperlipidemia   . Ischemic cardiomyopathy    a. s/p MDT ICD (originally had 0962 lead-->gen change and lead revision ~ 2012 @ [place]); b. 12/2014 Echo: EF 45-50%;  c.08/2015 Echo: EF 45-50%; d. 08/2017 Echo: EF 40-45%.  Marland Kitchen PAD (peripheral artery disease) (H[facility])    a. s/p bilat BKA  . Persistent atrial fibrillation    a. Dx 12/2014.  CHA2DS2VASc = 6--> warfarin;  b. 09/2015 s/p DCCV-->on amio; c. s/p DCCV 04/25/16; d. 07/2016 s/p RFCA/PVI in setting of recurrent afib despite amio; e. 05/2018 Recurrent afib.    PAST SURGICAL HISTORY:   Past Surgical History:  Procedure Laterality Date  . ABDOMINAL HYSTERECTOMY    . Amputation lower extremity bilaterally Bilateral   . CAPD INSERTION N/A 10/25/2018   Procedure: LAPAROSCOPIC INSERTION CONTINUOUS AMBULATORY PERITONEAL DIALYSIS  (CAPD) CATHETER;  Surgeon: Katha Cabal, MD;  Location: [facility] ORS;  Service: Vascular;  Laterality: N/A;  . CARDIAC CATHETERIZATION    . CARDIOVERSION N/A 10/09/2016   Procedure: CARDIOVERSION;  Surgeon: Lelon Perla, MD;  Location: [facility] ENDOSCOPY;  Service: Cardiovascular;  Laterality: N/A;  . CORONARY ARTERY BYPASS GRAFT    . DIALYSIS/PERMA CATHETER INSERTION N/A 10/22/2018   Procedure: DIALYSIS/PERMA CATHETER INSERTION;  Surgeon: Katha Cabal, MD;  Location: [facility] CV LAB;  Service: Cardiovascular;  Laterality: N/A;  . ELECTROPHYSIOLOGIC STUDY N/A 10/07/2015  Procedure: CARDIOVERSION;  Surgeon: Minna Merritts, MD;  Location: [facility] ORS;  Service: Cardiovascular;  Laterality: N/A;  . ELECTROPHYSIOLOGIC STUDY N/A 04/25/2016   Procedure: Cardioversion;  Surgeon: Minna Merritts, MD;  Location: [facility] ORS;  Service: Cardiovascular;  Laterality: N/A;  . ELECTROPHYSIOLOGIC STUDY N/A 07/18/2016   Procedure: Atrial Fibrillation Ablation;  Surgeon: Thompson Grayer, MD;  Location: [facility];  Service: Cardiovascular;  Laterality: N/A;  . IMPLANTABLE CARDIOVERTER  DEFIBRILLATOR IMPLANT  2005   [employer]   . TEE WITHOUT CARDIOVERSION N/A 07/18/2016   Procedure: TRANSESOPHAGEAL ECHOCARDIOGRAM (TEE);  Surgeon: Sueanne Margarita, MD;  Location: [facility] ENDOSCOPY;  Service: Cardiovascular;  Laterality: N/A;  . TEE WITHOUT CARDIOVERSION N/A 10/09/2016   Procedure: TRANSESOPHAGEAL ECHOCARDIOGRAM (TEE);  Surgeon: Lelon Perla, MD;  Location: [facility] ENDOSCOPY;  Service: Cardiovascular;  Laterality: N/A;    SOCIAL HISTORY:   Social History   Tobacco Use  . Smoking status: Never Smoker  . Smokeless tobacco: Never Used  Substance Use Topics  . Alcohol use: No    FAMILY HISTORY:   Family History  Problem Relation Age of Onset  . CAD Mother   . Diabetes Mother   . Atrial fibrillation Mother   . Lung cancer Father   . Diabetes Father     DRUG ALLERGIES:   Allergies  Allergen Reactions  . Piperacillin Other (See Comments)    Renal failure  . 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 See ID note**PER PT-CAUSED RENAL FAILURE**  . Zosyn [Piperacillin Sod-Tazobactam So] Other (See Comments)    Renal failure    REVIEW OF SYSTEMS:   Review of Systems  Constitutional: Negative for chills, fever and malaise/fatigue.  HENT: Negative for sore throat.   Eyes: Negative for blurred vision and double vision.  Respiratory: Positive for cough and shortness of breath. Negative for hemoptysis, sputum production, wheezing and stridor.   Cardiovascular: Positive for chest pain. Negative for palpitations, orthopnea and leg swelling.  Gastrointestinal: Positive for nausea and vomiting. Negative for abdominal pain, blood in stool, diarrhea and melena.  Genitourinary: Negative for dysuria, flank pain and hematuria.  Musculoskeletal: Negative for back pain and joint pain.  Skin: Negative for rash.  Neurological: Negative for dizziness, sensory change, focal weakness, seizures, loss of consciousness, weakness and headaches.    Endo/Heme/Allergies: Negative for polydipsia.  Psychiatric/Behavioral: Negative for depression. The patient is not nervous/anxious.     MEDICATIONS AT HOME:   Prior to Admission medications   Medication Sig Start Date End Date Taking? Authorizing Provider  amitriptyline (ELAVIL) 25 MG tablet Take 25 mg by mouth at bedtime.    Yes [provider]  amLODipine (NORVASC) 10 MG tablet TAKE 1 TABLET(10 MG) BY MOUTH DAILY 08/19/18  Yes Gollan, Kathlene November, MD  apixaban (ELIQUIS) 5 MG TABS tablet Take 1 tablet (5 mg total) by mouth 2 (two) times daily. 11/01/18  Yes Deboraha Sprang, MD  atorvastatin (LIPITOR) 40 MG tablet Take 40 mg by mouth at bedtime.    Yes [provider]  Cholecalciferol (HM VITAMIN D3) 100 MCG (4000 UT) CAPS Take 4,000 Units by mouth daily.    Yes [provider]  gabapentin (NEURONTIN) 100 MG capsule Take 100 mg by mouth 2 (two) times daily.   Yes [provider]  insulin aspart (NOVOLOG) 100 UNIT/ML injection Inject 5-10 Units into the skin 3 (three) times daily before meals. 10 units into the skin before breakfast then 5 units before  lunch then 10 units before supper (evening meal)   Yes [provider]  insulin glargine (LANTUS) 100 UNIT/ML injection Inject 0.15 mLs (15 Units total) into the skin 2 (two) times daily. 10/27/18  Yes Demetrios Loll, MD  iron polysaccharides (NIFEREX) 150 MG capsule Take 150 mg by mouth daily.   Yes [provider]  isosorbide mononitrate (IMDUR) 30 MG 24 hr tablet TAKE 2 TABLETS(60 MG) BY MOUTH TWICE DAILY 11/01/18  Yes Theora Gianotti, NP  Multiple Vitamins-Minerals (VITEYES AREDS FORMULA/LUTEIN) CAPS Take 1 capsule by mouth 2 (two) times daily.   Yes [provider]  nitroGLYCERIN (NITROSTAT) 0.4 MG SL tablet Place 1 tablet (0.4 mg total) under the tongue every 5 (five) minutes as needed for chest pain. 06/14/15  Yes Mody, Ulice Bold, MD  pantoprazole (PROTONIX) 40 MG tablet Take 40 mg  by mouth at bedtime. 06/19/16  Yes [provider]  sertraline (ZOLOFT) 100 MG tablet Take 100 mg by mouth at bedtime.  06/02/15  Yes [provider]  sulfamethoxazole-trimethoprim (BACTRIM DS,SEPTRA DS) 800-160 MG tablet Take 1 tablet by mouth 2 (two) times daily. 11/11/18  Yes [provider]  torsemide (DEMADEX) 20 MG tablet Take 2 tablets (40 mg total) by mouth 2 (two) times daily. 11/12/18  Yes Theora Gianotti, NP  vitamin C (ASCORBIC ACID) 500 MG tablet Take 500 mg by mouth daily.   Yes [provider]      VITAL SIGNS:  Blood pressure 119/77, pulse 99, temperature 99 F (37.2 C), temperature source Oral, resp. rate (!) 22, height 5\' 8"  (1.727 m), weight 93.9 kg, SpO2 90 %.  PHYSICAL EXAMINATION:  Physical Exam  GENERAL:  73 y.o.-year-old patient lying in the bed with no acute distress.  EYES: Pupils equal, round, reactive to light and accommodation. No scleral icterus. Extraocular muscles intact.  HEENT: Head atraumatic, normocephalic. Oropharynx and nasopharynx clear.  NECK:  Supple, no jugular venous distention. No thyroid enlargement, no tenderness.  LUNGS: Mild crackles bilaterally, no wheezing or rhonchi. No use of accessory muscles of respiration.  CARDIOVASCULAR: S1, S2 normal. No murmurs, rubs, or gallops.  ABDOMEN: Soft, nontender, nondistended. Bowel sounds present. No organomegaly or mass.  EXTREMITIES: No cyanosis, or clubbing.  Bilateral BKA is artificial extremities. NEUROLOGIC: Cranial nerves II through XII are intact. Muscle strength 5/5 in all extremities. Sensation intact. Gait not checked.  PSYCHIATRIC: The patient is alert and oriented x 3.  SKIN: No obvious rash, lesion, or ulcer.   LABORATORY PANEL:   CBC Recent Labs  Lab 11/18/18 1722  WBC 46.1*  HGB 10.9*  HCT 35.6*  PLT 278   ------------------------------------------------------------------------------------------------------------------  Chemistries   Recent Labs  Lab 11/18/18 1722  NA 135  K 4.7  CL 96*  CO2 26  GLUCOSE 126*  BUN 29*  CREATININE 3.13*  CALCIUM 9.4   ------------------------------------------------------------------------------------------------------------------  Cardiac Enzymes Recent Labs  Lab 11/18/18 1722  TROPONINI 1.44*   ------------------------------------------------------------------------------------------------------------------  RADIOLOGY:  Dg Chest 2 View  Result Date: 11/18/2018 CLINICAL DATA:  Chest pain. EXAM: CHEST - 2 VIEW COMPARISON:  October 29, 2018 FINDINGS: Stable right dialysis catheter. The heart, hila, mediastinum, and left AICD device are stable. No overt edema. Mild atelectasis in the bases. IMPRESSION: No active cardiopulmonary disease. Electronically Signed   By: Dorise Bullion III M.D   On: 11/18/2018 17:35   US Abdomen Limited Ruq  Result Date: 11/18/2018 CLINICAL DATA:  Right upper quadrant pain EXAM: ULTRASOUND ABDOMEN LIMITED RIGHT UPPER QUADRANT COMPARISON:  None. FINDINGS: Gallbladder: 1or 2 small stones measuring up to 6 mm seen in the gallbladder without wall thickening, pericholecystic fluid, or Murphy's sign. Common bile duct: Diameter: 1.1 mm Liver: No focal lesion identified. Within normal limits in parenchymal echogenicity. Portal vein is patent on color Doppler imaging with normal direction of blood flow towards the liver. IMPRESSION: Cholelithiasis without wall thickening, pericholecystic fluid, or Murphy's sign. Electronically Signed   By: Dorise Bullion III M.D   On: 11/18/2018 19:06      IMPRESSION AND PLAN:   Non-STEMI. The patient will be admitted to telemetry floor. Aspirin, heparin drip, Lipitor, Lopressor and Imdur, check lipid panel, follow-up troponin and cardiology consult from Dr. Rockey Situ.  Acute respiratory failure with hypoxia due to acute bronchitis. Persistent A. fib, on Eliquis. Hold Eliquis, started heparin drip.  Chronic systolic and  diastolic CHF with ejection fraction 35-40%.  Stable, continue torsemide. Hypertension.  Continue home hypertension medication. Diabetes.  Start sliding scale and continue Lantus. ESRD on PD.  Nephrology consult.  Dr. Candiss Norse informed. CLL.  Follow-up oncologist as outpatient.  All the records are reviewed and case discussed with ED provider. Management plans discussed with the patient, family and they are in agreement.  CODE STATUS: DNR.  TOTAL TIME TAKING CARE OF THIS PATIENT: 38 minutes.    Demetrios Loll M.D on 11/18/2018 at 8:57 PM  Between 7am to 6pm - Pager - [phone]  After 6pm go to [email or web address] - Proofreader  [facility] [place] [facility]  Office  [phone]  CC: Primary care physician; Apolonio Schneiders, MD   Note: This dictation was prepared with Dragon dictation along with smaller phrase technology. Any transcriptional errors that result from this process are unin

## 2018-11-18 NOTE — ED Notes (Signed)
Patient placed on 2L O2 

## 2018-11-18 NOTE — ED Notes (Signed)
Lab confirms that blue top is in lab for patient.

## 2018-11-18 NOTE — ED Notes (Signed)
US at bedside

## 2018-11-18 NOTE — ED Triage Notes (Addendum)
Presents with Left sided aching chest pain and upset stomach/vomiting yesterday. Denies diarrhea. Chest pain radiates to Right shoulder and into back. Patient last had dialysis on Friday. Has been getting trained to do peritoneal dialysis at home but has not been cleared yet

## 2018-11-18 NOTE — Progress Notes (Signed)
Advanced Care Plan.  Purpose of Encounter: CODE STATUS. Parties in Attendance: The patient and me. Patient's Decisional Capacity: Yes. Medical Story:  Caroline Hernandez  is a 73 y.o. female with a known history of CAD, chronic combined systolic and diastolic CHF with ejection fraction 35-40%, hypertension, diabetes, CKD on PD, cardiac arrest, A. fib, PAD and ischemic cardiomyopathy etc. the patient is being admitted for non-STEMI, acute respiratory failure with hypoxia due to acute bronchitis.  I discussed with patient's about her current condition, prognosis and CODE STATUS.  The patient stated that she does not want to be resuscitated and intubated if she has cardiopulmonary arrest. Plan:  Code Status: DNR. Time spent discussing advance care planning: 17 minutes.

## 2018-11-18 NOTE — Progress Notes (Signed)
ANTICOAGULATION CONSULT NOTE - Initial Consult  Pharmacy Consult for Heparin Drip Indication: chest pain/ACS  Allergies  Allergen Reactions  . Piperacillin Other (See Comments)    Renal failure  . 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 See ID note**PER PT-CAUSED RENAL FAILURE**  . Zosyn [Piperacillin Sod-Tazobactam So] Other (See Comments)    Renal failure    Patient Measurements: Height: 5\' 8"  (172.7 cm) Weight: 207 lb (93.9 kg) IBW/kg (Calculated) : 63.9 Heparin Dosing Weight: 84.1 kg  Vital Signs: Temp: 99 F (37.2 C) (03/09 1701) Temp Source: Oral (03/09 1701) BP: 132/56 (03/09 1930) Pulse Rate: 106 (03/09 1930)  Labs: Recent Labs    11/18/18 1722 11/18/18 1927  HGB 10.9*  --   HCT 35.6*  --   PLT 278  --   APTT  --  43*  LABPROT  --  18.2*  INR  --  1.5*  CREATININE 3.13*  --   TROPONINI 1.44*  --     Estimated Creatinine Clearance: 20 mL/min (A) (by C-G formula based on SCr of 3.13 mg/dL (H)).   Medical History: Past Medical History:  Diagnosis Date  . Amputation of left lower extremity below knee ([place])   . Amputation of right lower extremity below knee ([place])   . CAD (coronary artery disease)    a. 2004 Cardiac Arrest/CABG x 3 (LIMA->LAD, VG->OM, VG->RCA);  b. 06/2015 lexiscan MV: no significant ischemia, EF 48%, low risk->Med Rx.  . Chronic combined systolic and diastolic CHF (congestive heart failure) ([place])    a. 12/2014 Echo: EF 45-50%;  b. 08/2015 Echo: EF 45-50%, ant, antsept HK, mildly dil LA, nl RV, mild-mod TR, sev PAH (34mmHg); b. 08/2017 TEE: EF 40-45%, large PFO w/ L->R shunting.  . CKD (chronic kidney disease), stage IV ([place])   . CLL (chronic lymphocytic leukemia) ([place])   . Diabetes mellitus without complication ([place])   . Essential hypertension   . GERD (gastroesophageal reflux disease)   . Hiatal hernia   . History of cardiac arrest    a. 2004.  Marland Kitchen Hyperlipidemia   . Ischemic cardiomyopathy    a.  s/p MDT ICD (originally had 7026 lead-->gen change and lead revision ~ 2012 @ [place]); b. 12/2014 Echo: EF 45-50%;  c.08/2015 Echo: EF 45-50%; d. 08/2017 Echo: EF 40-45%.  Marland Kitchen PAD (peripheral artery disease) ([facility])    a. s/p bilat BKA  . Persistent atrial fibrillation    a. Dx 12/2014.  CHA2DS2VASc = 6--> warfarin;  b. 09/2015 s/p DCCV-->on amio; c. s/p DCCV 04/25/16; d. 07/2016 s/p RFCA/PVI in setting of recurrent afib despite amio; e. 05/2018 Recurrent afib.    Assessment: Patient is a 73yo female admitted with chest pain. Pharmacy consulted for Heparin dosing. Noted that patient was taking Apixaban 5mg  bid prior to admission, last dose was 3/9 at 07:00.  Goal of Therapy:  Heparin level 0.3-0.7 units/ml Heparin level 66-102 units/ml Monitor platelets by anticoagulation protocol: Yes   Plan:  Give 0 units bolus x 1 Start heparin infusion at 1100 units/hr Check anti-Xa level in 8 hours and daily while on heparin Continue to monitor H&H and platelets  Paulina Fusi, PharmD, BCPS 11/18/2018 8:20 PM

## 2018-11-18 NOTE — ED Notes (Signed)
IV team at bedside 

## 2018-11-18 NOTE — ED Notes (Signed)
ED Provider at bedside. 

## 2018-11-18 NOTE — ED Provider Notes (Signed)
[facility] Emergency Department Provider Note ____________________________________________   First MD Initiated Contact with Patient 11/18/18 1739     (approximate)  I have reviewed the triage vital signs and the nursing notes.   HISTORY  Chief Complaint Chest Pain    HPI Caroline Hernandez is a 73 y.o. female with PMH as noted below including CKD on peritoneal dialysis, CLL, CAD and CHF who presents with shortness of breath over the last several days, associated with chest pain mainly on the right, as well as with nausea and vomiting.  She states that the pain radiates to her right shoulder and into her back.  She states that her last dialysis was 3 days ago.  The patient denies fever or diarrhea.  She has had no urinary symptoms.  Past Medical History:  Diagnosis Date  . Amputation of left lower extremity below knee ([place])   . Amputation of right lower extremity below knee ([place])   . CAD (coronary artery disease)    a. 2004 Cardiac Arrest/CABG x 3 (LIMA->LAD, VG->OM, VG->RCA);  b. 06/2015 lexiscan MV: no significant ischemia, EF 48%, low risk->Med Rx.  . Chronic combined systolic and diastolic CHF (congestive heart failure) ([place])    a. 12/2014 Echo: EF 45-50%;  b. 08/2015 Echo: EF 45-50%, ant, antsept HK, mildly dil LA, nl RV, mild-mod TR, sev PAH (75mmHg); b. 08/2017 TEE: EF 40-45%, large PFO w/ L->R shunting.  . CKD (chronic kidney disease), stage IV ([place])   . CLL (chronic lymphocytic leukemia) ([place])   . Diabetes mellitus without complication ([place])   . Essential hypertension   . GERD (gastroesophageal reflux disease)   . Hiatal hernia   . History of cardiac arrest    a. 2004.  Marland Kitchen Hyperlipidemia   . Ischemic cardiomyopathy    a. s/p MDT ICD (originally had 2355 lead-->gen change and lead revision ~ 2012 @ [place]); b. 12/2014 Echo: EF 45-50%;  c.08/2015 Echo: EF 45-50%; d. 08/2017 Echo: EF 40-45%.  Marland Kitchen PAD (peripheral artery disease) (HCC)    a. s/p bilat BKA  .  Persistent atrial fibrillation    a. Dx 12/2014.  CHA2DS2VASc = 6--> warfarin;  b. 09/2015 s/p DCCV-->on amio; c. s/p DCCV 04/25/16; d. 07/2016 s/p RFCA/PVI in setting of recurrent afib despite amio; e. 05/2018 Recurrent afib.    Patient Active Problem List   Diagnosis Date Noted  . NSTEMI (non-ST elevated myocardial infarction) ([place]) 11/18/2018  . CLL (chronic lymphocytic leukemia) ([place]) 10/23/2018  . Pressure injury of skin 10/19/2018  . CHF (congestive heart failure) ([place]) 10/16/2018  . CAP (community acquired pneumonia) 04/14/2018  . UTI (urinary tract infection) 03/07/2018  . Atrial fibrillation with slow ventricular response ([place]) 07/18/2016  . Chronic diastolic CHF (congestive heart failure) ([place])   . SOB (shortness of breath)   . Acute on chronic systolic heart failure ([place]) 08/20/2015  . CKD (chronic kidney disease), stage IV ([place])   . Ischemic cardiomyopathy   . Chronic combined systolic and diastolic CHF (congestive heart failure) ([place])   . Essential hypertension 06/18/2015  . Systolic and diastolic CHF, acute on chronic ([place])   . Acute on chronic renal failure ([place])   . Angina pectoris ([place])   . Coronary artery disease involving native coronary artery of native heart with angina pectoris with documented spasm ([place])   . 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 ([place])   . Persistent atrial fibrillation   . Bradycardia   . Chest pain 06/12/2015  Past Surgical History:  Procedure Laterality Date  . ABDOMINAL HYSTERECTOMY    . Amputation lower extremity bilaterally Bilateral   . CAPD INSERTION N/A 10/25/2018   Procedure: LAPAROSCOPIC INSERTION CONTINUOUS AMBULATORY PERITONEAL DIALYSIS  (CAPD) CATHETER;  Surgeon: Katha Cabal, MD;  Location: [facility] ORS;  Service: Vascular;  Laterality: N/A;  . CARDIAC CATHETERIZATION    . CARDIOVERSION N/A 10/09/2016   Procedure: CARDIOVERSION;  Surgeon: Lelon Perla, MD;  Location: [facility] ENDOSCOPY;  Service:  Cardiovascular;  Laterality: N/A;  . CORONARY ARTERY BYPASS GRAFT    . DIALYSIS/PERMA CATHETER INSERTION N/A 10/22/2018   Procedure: DIALYSIS/PERMA CATHETER INSERTION;  Surgeon: Katha Cabal, MD;  Location: [place] [facility] CV LAB;  Service: Cardiovascular;  Laterality: N/A;  . ELECTROPHYSIOLOGIC STUDY N/A 10/07/2015   Procedure: CARDIOVERSION;  Surgeon: Minna Merritts, MD;  Location: [facility] ORS;  Service: Cardiovascular;  Laterality: N/A;  . ELECTROPHYSIOLOGIC STUDY N/A 04/25/2016   Procedure: Cardioversion;  Surgeon: Minna Merritts, MD;  Location: [facility] ORS;  Service: Cardiovascular;  Laterality: N/A;  . ELECTROPHYSIOLOGIC STUDY N/A 07/18/2016   Procedure: Atrial Fibrillation Ablation;  Surgeon: Thompson Grayer, MD;  Location: [place] CV LAB;  Service: Cardiovascular;  Laterality: N/A;  . IMPLANTABLE CARDIOVERTER DEFIBRILLATOR IMPLANT  2005   [employer]   . TEE WITHOUT CARDIOVERSION N/A 07/18/2016   Procedure: TRANSESOPHAGEAL ECHOCARDIOGRAM (TEE);  Surgeon: Sueanne Margarita, MD;  Location: [facility] ENDOSCOPY;  Service: Cardiovascular;  Laterality: N/A;  . TEE WITHOUT CARDIOVERSION N/A 10/09/2016   Procedure: TRANSESOPHAGEAL ECHOCARDIOGRAM (TEE);  Surgeon: Lelon Perla, MD;  Location: [facility] ENDOSCOPY;  Service: Cardiovascular;  Laterality: N/A;    Prior to Admission medications   Medication Sig Start Date End Date Taking? Authorizing Provider  amitriptyline (ELAVIL) 25 MG tablet Take 25 mg by mouth at bedtime.    Yes [provider]  amLODipine (NORVASC) 10 MG tablet TAKE 1 TABLET(10 MG) BY MOUTH DAILY 08/19/18  Yes Gollan, Kathlene November, MD  apixaban (ELIQUIS) 5 MG TABS tablet Take 1 tablet (5 mg total) by mouth 2 (two) times daily. 11/01/18  Yes Deboraha Sprang, MD  atorvastatin (LIPITOR) 40 MG tablet Take 40 mg by mouth at bedtime.    Yes [provider]  Cholecalciferol (HM VITAMIN D3) 100 MCG (4000 UT) CAPS Take 4,000 Units by mouth daily.    Yes [provider]  gabapentin  (NEURONTIN) 100 MG capsule Take 100 mg by mouth 2 (two) times daily.   Yes [provider]  insulin aspart (NOVOLOG) 100 UNIT/ML injection Inject 5-10 Units into the skin 3 (three) times daily before meals. 10 units into the skin before breakfast then 5 units before lunch then 10 units before supper (evening meal)   Yes [provider]  insulin glargine (LANTUS) 100 UNIT/ML injection Inject 0.15 mLs (15 Units total) into the skin 2 (two) times daily. 10/27/18  Yes Demetrios Loll, MD  iron polysaccharides (NIFEREX) 150 MG capsule Take 150 mg by mouth daily.   Yes [provider]  isosorbide mononitrate (IMDUR) 30 MG 24 hr tablet TAKE 2 TABLETS(60 MG) BY MOUTH TWICE DAILY 11/01/18  Yes Theora Gianotti, NP  Multiple Vitamins-Minerals (VITEYES AREDS FORMULA/LUTEIN) CAPS Take 1 capsule by mouth 2 (two) times daily.   Yes [provider]  nitroGLYCERIN (NITROSTAT) 0.4 MG SL tablet Place 1 tablet (0.4 mg total) under the tongue every 5 (five) minutes as needed for chest pain. 06/14/15  Yes Mody, Ulice Bold, MD  pantoprazole (PROTONIX) 40 MG tablet Take 40  mg by mouth at bedtime. 06/19/16  Yes [provider]  sertraline (ZOLOFT) 100 MG tablet Take 100 mg by mouth at bedtime.  06/02/15  Yes [provider]  sulfamethoxazole-trimethoprim (BACTRIM DS,SEPTRA DS) 800-160 MG tablet Take 1 tablet by mouth 2 (two) times daily. 11/11/18  Yes [provider]  torsemide (DEMADEX) 20 MG tablet Take 2 tablets (40 mg total) by mouth 2 (two) times daily. 11/12/18  Yes Theora Gianotti, NP  vitamin C (ASCORBIC ACID) 500 MG tablet Take 500 mg by mouth daily.   Yes [provider]    Allergies Piperacillin; Piperacillin-tazobactam in dex; and Zosyn [piperacillin sod-tazobactam so]  Family History  Problem Relation Age of Onset  . CAD Mother   . Diabetes Mother   . Atrial fibrillation Mother   . Lung cancer Father   . Diabetes Father     Social  History Social History   Tobacco Use  . Smoking status: Never Smoker  . Smokeless tobacco: Never Used  Substance Use Topics  . Alcohol use: No  . Drug use: No    Review of Systems  Constitutional: No fever. Eyes: No redness ENT: No sore throat. Cardiovascular: Positive for chest pain. Respiratory: Positive for shortness of breath. Gastrointestinal: Positive for nausea..  Genitourinary: Negative for dysuria or frequency.  Musculoskeletal: Negative for back pain. Skin: Negative for rash. Neurological: Negative for headache.   ____________________________________________   PHYSICAL EXAM:  VITAL SIGNS: ED Triage Vitals  Enc Vitals Group     BP 11/18/18 1701 128/60     Pulse Rate 11/18/18 1701 (!) 104     Resp 11/18/18 1701 (!) 23     Temp 11/18/18 1701 99 F (37.2 C)     Temp Source 11/18/18 1701 Oral     SpO2 11/18/18 1700 (!) 87 %     Weight 11/18/18 1702 207 lb (93.9 kg)     Height 11/18/18 1702 5\' 8"  (1.727 m)     Head Circumference --      Peak Flow --      Pain Score 11/18/18 1701 6     Pain Loc --      Pain Edu? --      Excl. in [place]? --     Constitutional: Alert and oriented.  Somewhat uncomfortable appearing but in no acute distress. Eyes: Conjunctivae are normal.  Head: Atraumatic. Nose: No congestion/rhinnorhea. Mouth/Throat: Mucous membranes are very dry.   Neck: Normal range of motion.  Cardiovascular: Normal rate, regular rhythm. Grossly normal heart sounds.  Good peripheral circulation. Respiratory: Slightly increased respiratory effort.  No retractions.  Decreased breath sounds bilaterally. Gastrointestinal: Soft and nontender. No distention.  Genitourinary: No flank tenderness. Musculoskeletal: No lower extremity edema.  Extremities warm and well perfused.  Neurologic:  Normal speech and language. No gross focal neurologic deficits are appreciated.  Skin:  Skin is warm and dry. No rash noted. Psychiatric: Mood and affect are normal. Speech and  behavior are normal.  ____________________________________________   LABS (all labs ordered are listed, but only abnormal results are displayed)  Labs Reviewed  BASIC METABOLIC PANEL - Abnormal; Notable for the following components:      Result Value   Chloride 96 (*)    Glucose, Bld 126 (*)    BUN 29 (*)    Creatinine, Ser 3.13 (*)    GFR calc non Af Amer 14 (*)    GFR calc Af Amer 17 (*)    All other components within normal limits  CBC - Abnormal; Notable for the following components:   WBC 46.1 (*)    Hemoglobin 10.9 (*)    HCT 35.6 (*)    RDW 19.9 (*)    All other components within normal limits  TROPONIN I - Abnormal; Notable for the following components:   Troponin I 1.44 (*)    All other components within normal limits  PROTIME-INR - Abnormal; Notable for the following components:   Prothrombin Time 18.2 (*)    INR 1.5 (*)    All other components within normal limits  APTT - Abnormal; Notable for the following components:   aPTT 43 (*)    All other components within normal limits  HEPARIN LEVEL (UNFRACTIONATED) - Abnormal; Notable for the following components:   Heparin Unfractionated >3.60 (*)    All other components within normal limits  TROPONIN I - Abnormal; Notable for the following components:   Troponin I 3.84 (*)    All other components within normal limits  GLUCOSE, CAPILLARY - Abnormal; Notable for the following components:   Glucose-Capillary 144 (*)    All other components within normal limits  MRSA PCR SCREENING  INFLUENZA PANEL BY PCR (TYPE A & B)  MAGNESIUM  URINALYSIS, COMPLETE (UACMP) WITH MICROSCOPIC  APTT  HEPARIN LEVEL (UNFRACTIONATED)  TROPONIN I  LIPID PANEL   ____________________________________________  EKG  ED ECG REPORT I, Arta Silence, the attending physician, personally viewed and interpreted this ECG.  Date: 11/18/2018 EKG Time: 1705 Rate: 110 Rhythm: Sinus tachycardia QRS Axis: Left axis Intervals: LBBB ST/T  Wave abnormalities: Nonspecific abnormalities Narrative Interpretation: no evidence of acute ischemia; no significant change when compared to EKG of 11/07/2018  ____________________________________________  RADIOLOGY  CXR: No focal infiltrate or edema US abdomen RUQ: Cholelithiasis with no other acute findings ____________________________________________   PROCEDURES  Procedure(s) performed: No  Procedures  Critical Care performed: Yes  CRITICAL CARE Performed by: Arta Silence   Total critical care time: 30 minutes  Critical care time was exclusive of separately billable procedures and treating other patients.  Critical care was necessary to treat or prevent imminent or life-threatening deterioration.  Critical care was time spent personally by me on the following activities: development of treatment plan with patient and/or surrogate as well as nursing, discussions with consultants, evaluation of patient's response to treatment, examination of patient, obtaining history from patient or surrogate, ordering and performing treatments and interventions, ordering and review of laboratory studies, ordering and review of radiographic studies, pulse oximetry and re-evaluation of patient's condition. ____________________________________________   INITIAL IMPRESSION / ASSESSMENT AND PLAN / ED COURSE  Pertinent labs & imaging results that were available during my care of the patient were reviewed by me and considered in my medical decision making (see chart for details).  73 year old female with PMH as noted above presents with shortness of breath, somewhat atypical chest pain, and vomiting over the last few days.  I reviewed the past medical records in [place].  The patient was admitted last month initially with hypoxia, acute bronchitis, and urinary tract infection.  On exam today, the patient is slightly uncomfortable appearing but in no acute distress.  She has borderline  tachycardia and is hypoxic on room air.  The remainder her vital signs are normal.  She has decreased breath sounds bilaterally.  And no focal abdominal tenderness.  The remainder of the exam is as described above.  Overall presentation is most consistent with viral bronchitis versus possible pneumonia.  We will also test the patient for  influenza.  Given her right sided pain that radiates down into her abdomen, the patient noted concern for gallstones or cholecystitis and this is possibility as well.  The patient clinically does not appear fluid overloaded; on the contrary, she appears dehydrated.  We will give a fluid bolus, bronchodilators, obtain chest x-ray and labs, and reassess.  ----------------------------------------- 12:18 AM on 11/19/2018 -----------------------------------------  Chest x-ray showed no focal infiltrate.  Patient's WBC count was elevated but this is consistent with her baseline.  Her other lab work-up was also consistent with her baseline.  Influenza was negative.  Overall presentation is consistent with acute bronchitis.  However, the patient's troponin was significantly elevated.  Given the presence of chest pain I elected to start her on a heparin drip.  She has no EKG changes.  Ultrasound of the right upper quadrant was also negative.  Given the patient's hypoxia as well as her elevated troponin, she required admission.  I signed her out to the hospitalist Dr. Bridgett Larsson. ____________________________________________   FINAL CLINICAL IMPRESSION(S) / ED DIAGNOSES  Final diagnoses:  Right upper quadrant abdominal pain  Acute respiratory failure with hypoxia ([place])  Acute bronchitis, unspecified organism      NEW MEDICATIONS STARTED DURING THIS VISIT:  Current Discharge Medication List       Note:  This document was prepared using Dragon voice recognition software and may include unintentional dictation errors.    Arta Silence, MD 11/19/18 [phone]

## 2018-11-18 NOTE — ED Notes (Signed)
Date and time results received: 11/18/18 5:49 PM  (use smartphrase ".now" to insert current time)  Test: 1.44 troponin. Critical Value:  Name of Provider Notified: Dr. Arta Silence

## 2018-11-18 NOTE — ED Notes (Signed)
PIV attempt to right hand unsuccesful. Vicente Males, RN at bedside to attempt PIV insertion

## 2018-11-19 DIAGNOSIS — I255 Ischemic cardiomyopathy: Secondary | ICD-10-CM

## 2018-11-19 DIAGNOSIS — N186 End stage renal disease: Secondary | ICD-10-CM

## 2018-11-19 DIAGNOSIS — Z951 Presence of aortocoronary bypass graft: Secondary | ICD-10-CM

## 2018-11-19 LAB — MRSA PCR SCREENING: MRSA by PCR: NEGATIVE

## 2018-11-19 LAB — URINALYSIS, COMPLETE (UACMP) WITH MICROSCOPIC
Bacteria, UA: NONE SEEN
Bilirubin Urine: NEGATIVE
Glucose, UA: NEGATIVE mg/dL
HGB URINE DIPSTICK: NEGATIVE
Ketones, ur: NEGATIVE mg/dL
Nitrite: NEGATIVE
Protein, ur: 100 mg/dL — AB
Specific Gravity, Urine: 1.015 (ref 1.005–1.030)
pH: 5 (ref 5.0–8.0)

## 2018-11-19 LAB — GLUCOSE, CAPILLARY
Glucose-Capillary: 102 mg/dL — ABNORMAL HIGH (ref 70–99)
Glucose-Capillary: 139 mg/dL — ABNORMAL HIGH (ref 70–99)
Glucose-Capillary: 100 mg/dL — ABNORMAL HIGH (ref 70–99)
Glucose-Capillary: 96 mg/dL (ref 70–99)

## 2018-11-19 LAB — TROPONIN I
TROPONIN I: 5.09 ng/mL — AB (ref <0.03)
Troponin I: 7.68 ng/mL (ref <0.03)
Troponin I: 8.26 ng/mL (ref <0.03)

## 2018-11-19 LAB — LIPID PANEL
Cholesterol: 176 mg/dL (ref 0–200)
HDL: 40 mg/dL — ABNORMAL LOW (ref >40)
LDL Cholesterol: 114 mg/dL — ABNORMAL HIGH (ref 0–99)
Total CHOL/HDL Ratio: 4.4 RATIO
Triglycerides: 109 mg/dL (ref <150)
VLDL: 22 mg/dL (ref 0–40)

## 2018-11-19 LAB — APTT
APTT: 77 s — AB (ref 24–36)
aPTT: 99 seconds — ABNORMAL HIGH (ref 24–36)
aPTT: 80 seconds — ABNORMAL HIGH (ref 24–36)

## 2018-11-19 LAB — HEPARIN LEVEL (UNFRACTIONATED): Heparin Unfractionated: 3.18 IU/mL — ABNORMAL HIGH (ref 0.30–0.70)

## 2018-11-19 MED ORDER — CHLORHEXIDINE GLUCONATE CLOTH 2 % EX PADS
6.0000 | MEDICATED_PAD | Freq: Every day | CUTANEOUS | Status: DC
  Administered 2018-11-19 – 2018-11-23 (×5): 6 via TOPICAL

## 2018-11-19 MED ORDER — HEPARIN SODIUM (PORCINE) 1000 UNIT/ML DIALYSIS
20.0000 [IU]/kg | INTRAMUSCULAR | Status: DC | PRN
  Filled 2018-11-19: qty 3

## 2018-11-19 MED ORDER — ASPIRIN 81 MG PO CHEW
81.0000 mg | CHEWABLE_TABLET | ORAL | Status: AC
  Administered 2018-11-20: 81 mg via ORAL
  Filled 2018-11-19: qty 1

## 2018-11-19 MED ORDER — VANCOMYCIN HCL 10 G IV SOLR
1500.0000 mg | Freq: Once | INTRAVENOUS | Status: AC
  Administered 2018-11-19: 1500 mg via INTRAVENOUS
  Filled 2018-11-19: qty 1500

## 2018-11-19 NOTE — Progress Notes (Signed)
Hemodialysis initiated without issue via R Chest HD cath. Patient is short of breath, especially when talking. Sats currently stable on 4L [place]. Afib on monitor, UF goal 2.5L. Dr. Candiss Norse at bedside.  Patient complains of some nausea. Will medicate. Continue to monitor.

## 2018-11-19 NOTE — Progress Notes (Signed)
HD completed. UF 2.5L as ordered. Patient still exhibits dyspnea with exertion but breathing has improved post HD. Remains on 4L [place] with sats 97%. Report called to primary RN. All vitals stable.

## 2018-11-19 NOTE — Progress Notes (Signed)
ANTICOAGULATION CONSULT NOTE - Initial Consult  Pharmacy Consult for Heparin Drip Indication: chest pain/ACS  Allergies  Allergen Reactions  . Piperacillin Other (See Comments)    Renal failure  . 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 See ID note**PER PT-CAUSED RENAL FAILURE**  . Zosyn [Piperacillin Sod-Tazobactam So] Other (See Comments)    Renal failure    Patient Measurements: Height: 5\' 8"  (172.7 cm) Weight: 229 lb 1.6 oz (103.9 kg) IBW/kg (Calculated) : 63.9 Heparin Dosing Weight: 84.1 kg  Vital Signs: Temp: 99.1 F (37.3 C) (03/10 0328) Temp Source: Oral (03/10 0328) BP: 128/62 (03/10 0328) Pulse Rate: 81 (03/10 0328)  Labs: Recent Labs    11/18/18 1722 11/18/18 1927 11/18/18 2255 11/19/18 0407  HGB 10.9*  --   --   --   HCT 35.6*  --   --   --   PLT 278  --   --   --   APTT  --  43*  --  99*  LABPROT  --  18.2*  --   --   INR  --  1.5*  --   --   HEPARINUNFRC  --  >3.60*  --   --   CREATININE 3.13*  --   --   --   TROPONINI 1.44*  --  3.84* 7.68*    Estimated Creatinine Clearance: 21.1 mL/min (A) (by C-G formula based on SCr of 3.13 mg/dL (H)).   Medical History: Past Medical History:  Diagnosis Date  . Amputation of left lower extremity below knee ([place])   . Amputation of right lower extremity below knee ([place])   . CAD (coronary artery disease)    a. 2004 Cardiac Arrest/CABG x 3 (LIMA->LAD, VG->OM, VG->RCA);  b. 06/2015 lexiscan MV: no significant ischemia, EF 48%, low risk->Med Rx.  . Chronic combined systolic and diastolic CHF (congestive heart failure) ([place])    a. 12/2014 Echo: EF 45-50%;  b. 08/2015 Echo: EF 45-50%, ant, antsept HK, mildly dil LA, nl RV, mild-mod TR, sev PAH (13mmHg); b. 08/2017 TEE: EF 40-45%, large PFO w/ L->R shunting.  . CKD (chronic kidney disease), stage IV ([place])   . CLL (chronic lymphocytic leukemia) ([place])   . Diabetes mellitus without complication ([place])   . Essential hypertension    . GERD (gastroesophageal reflux disease)   . Hiatal hernia   . History of cardiac arrest    a. 2004.  Marland Kitchen Hyperlipidemia   . Ischemic cardiomyopathy    a. s/p MDT ICD (originally had 0272 lead-->gen change and lead revision ~ 2012 @ [place]); b. 12/2014 Echo: EF 45-50%;  c.08/2015 Echo: EF 45-50%; d. 08/2017 Echo: EF 40-45%.  Marland Kitchen PAD (peripheral artery disease) (HCC)    a. s/p bilat BKA  . Persistent atrial fibrillation    a. Dx 12/2014.  CHA2DS2VASc = 6--> warfarin;  b. 09/2015 s/p DCCV-->on amio; c. s/p DCCV 04/25/16; d. 07/2016 s/p RFCA/PVI in setting of recurrent afib despite amio; e. 05/2018 Recurrent afib.    Assessment: Patient is a 73yo female admitted with chest pain. Pharmacy consulted for Heparin dosing. Noted that patient was taking Apixaban 5mg  bid prior to admission, last dose was 3/9 at 07:00.  Goal of Therapy:  Heparin level 0.3-0.7 units/ml Heparin level 66-102 units/ml Monitor platelets by anticoagulation protocol: Yes   Plan:  03/10 @ 0400 aPTT 99 seconds therapeutic; HL still pending. Will continue current rate and will recheck aPTT @ 1000 and HL w/ am labs. [place]  continue to trend up 3.84 >> 7.86, will continue to monitor.  Tobie Lords, PharmD, BCPS Clinical Pharmacist 11/19/2018

## 2018-11-19 NOTE — Progress Notes (Signed)
[facility] [place] NAME: Caroline Hernandez    MR#:  [id number]  DATE OF BIRTH:  Jul 10, 1946  SUBJECTIVE:  CHIEF COMPLAINT:   Chief Complaint  Patient presents with  . Chest Pain   -Patient came in from home secondary to sudden onset of significant chest pain radiating to her back.  Troponin has been elevated -Last dose of Eliquis yesterday morning.  Started on heparin drip -For cardiac cath tomorrow  REVIEW OF SYSTEMS:  Review of Systems  Constitutional: Positive for malaise/fatigue. Negative for chills and fever.  HENT: Negative for ear discharge, hearing loss and nosebleeds.   Eyes: Negative for blurred vision and double vision.  Respiratory: Positive for shortness of breath. Negative for cough and wheezing.   Cardiovascular: Positive for chest pain and orthopnea. Negative for palpitations and leg swelling.  Gastrointestinal: Negative for abdominal pain, constipation, diarrhea, nausea and vomiting.  Genitourinary: Negative for dysuria.  Neurological: Negative for dizziness, focal weakness, seizures, weakness and headaches.  Psychiatric/Behavioral: Negative for depression.    DRUG ALLERGIES:   Allergies  Allergen Reactions  . Piperacillin Other (See Comments)    Renal failure  . 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 See ID note**PER PT-CAUSED RENAL FAILURE**  . Zosyn [Piperacillin Sod-Tazobactam So] Other (See Comments)    Renal failure    VITALS:  Blood pressure 135/82, pulse (!) 102, temperature 98.2 F (36.8 C), temperature source Oral, resp. rate 18, height 5\' 8"  (1.727 m), weight 104 kg, SpO2 91 %.  PHYSICAL EXAMINATION:  Physical Exam  GENERAL:  73 y.o.-year-old patient lying in the bed with no acute distress.  EYES: Pupils equal, round, reactive to light and accommodation. No scleral icterus. Extraocular muscles intact.  HEENT: Head atraumatic, normocephalic. Oropharynx and  nasopharynx clear.  NECK:  Supple, no jugular venous distention. No thyroid enlargement, no tenderness.  LUNGS: Normal breath sounds bilaterally, no wheezing, rales,rhonchi or crepitation. No use of accessory muscles of respiration.  Basilar crackles heard CARDIOVASCULAR: S1, S2 normal. No   rubs, or gallops. 2/6 systolic murmur present ABDOMEN: Soft, nontender, nondistended. Abdominal PD catheter present. Bowel sounds present. No organomegaly or mass.  EXTREMITIES: s/p bilateral BKA Right chest permacath present NEUROLOGIC: Cranial nerves II through XII are intact. Muscle strength 5/5 in all extremities. Sensation intact. Gait not checked.  PSYCHIATRIC: The patient is alert and oriented x 3.  SKIN: No obvious rash, lesion, or ulcer.    LABORATORY PANEL:   CBC Recent Labs  Lab 11/18/18 1722  WBC 46.1*  HGB 10.9*  HCT 35.6*  PLT 278   ------------------------------------------------------------------------------------------------------------------  Chemistries  Recent Labs  Lab 11/18/18 1722 11/18/18 2255  NA 135  --   K 4.7  --   CL 96*  --   CO2 26  --   GLUCOSE 126*  --   BUN 29*  --   CREATININE 3.13*  --   CALCIUM 9.4  --   MG  --  2.0   ------------------------------------------------------------------------------------------------------------------  Cardiac Enzymes Recent Labs  Lab 11/19/18 1012  TROPONINI 8.26*   ------------------------------------------------------------------------------------------------------------------  RADIOLOGY:  Dg Chest 2 View  Result Date: 11/18/2018 CLINICAL DATA:  Chest pain. EXAM: CHEST - 2 VIEW COMPARISON:  October 29, 2018 FINDINGS: Stable right dialysis catheter. The heart, hila, mediastinum, and left AICD device are stable. No overt edema. Mild atelectasis in the bases. IMPRESSION: No active cardiopulmonary disease. Electronically Signed   By: Dorise Bullion  III M.D   On: 11/18/2018 17:35   US Abdomen Limited  Ruq  Result Date: 11/18/2018 CLINICAL DATA:  Right upper quadrant pain EXAM: ULTRASOUND ABDOMEN LIMITED RIGHT UPPER QUADRANT COMPARISON:  None. FINDINGS: Gallbladder: 1or 2 small stones measuring up to 6 mm seen in the gallbladder without wall thickening, pericholecystic fluid, or Murphy's sign. Common bile duct: Diameter: 1.1 mm Liver: No focal lesion identified. Within normal limits in parenchymal echogenicity. Portal vein is patent on color Doppler imaging with normal direction of blood flow towards the liver. IMPRESSION: Cholelithiasis without wall thickening, pericholecystic fluid, or Murphy's sign. Electronically Signed   By: Dorise Bullion III M.D   On: 11/18/2018 19:06    EKG:   Orders placed or performed during the hospital encounter of 11/18/18  . EKG 12-Lead  . EKG 12-Lead  . ED EKG  . ED EKG    ASSESSMENT AND PLAN:   73 year old female with multiple past medical history significant for history of CAD status post CABG, CLL, diabetes, PAD status post bilateral BKA, chronic diastolic heart failure, anemia, end-stage renal disease on dialysis, atrial fibrillation status post ablation and pacer/ICD on Eliquis comes to hospital secondary to chest pain.  1. NSTEMI-known significant cardiac history -Appreciate cardiology consult.  Eliquis held, started on heparin drip -Troponin elevated, for cardiac cath in a.m. -Last echocardiogram with EF of 35 to 40% -Continue cardiac medications. -currently on oxygen which is acute, continue to wean as tolerated  2.  End-stage renal disease on dialysis-patient initiated on hemodialysis pretty recently. -Finished training for home PD dialysis.  Since she is in the hospital and requires cardiac cath tomorrow, consulted nephrology. -Going for hemodialysis today.  3.  Atrial fibrillation-status post prior ablations, continue rate control, on low-dose metoprolol -Was on Eliquis which is held, currently started on heparin drip.  4.   Leukocytosis--secondary to her CLL.  Monitor  5.  Hypertension-on Norvasc, Imdur, metoprolol  6.  Diabetes mellitus-on Lantus and sliding scale insulin  7.  DVT prophylaxis-on heparin drip   Will need home health at discharge.  Daughter updated at bedside -Patient has bilateral prostheses for the lower extremities   All the records are reviewed and case discussed with Care Management/Social Workerr. Management plans discussed with the patient, family and they are in agreement.  CODE STATUS: Full Code  TOTAL TIME TAKING CARE OF THIS PATIENT: 38 minutes.   POSSIBLE D/C IN 2 DAYS, DEPENDING ON CLINICAL CONDITION.   Gladstone Lighter M.D on 11/19/2018 at 12:40 PM  Between 7am to 6pm - Pager - [phone]  After 6pm go to [email or web address] - password [email or web address] [facility]  Office  [phone]  CC: Primary care physician; Apolonio Schneiders, MD

## 2018-11-19 NOTE — Progress Notes (Signed)
ANTICOAGULATION CONSULT NOTE - Initial Consult  Pharmacy Consult for Heparin Drip Indication: chest pain/ACS  Allergies  Allergen Reactions  . Piperacillin Other (See Comments)    Renal failure  . 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 See ID note**PER PT-CAUSED RENAL FAILURE**  . Zosyn [Piperacillin Sod-Tazobactam So] Other (See Comments)    Renal failure    Patient Measurements: Height: 5\' 8"  (172.7 cm) Weight: 232 lb 9.4 oz (105.5 kg) IBW/kg (Calculated) : 63.9 Heparin Dosing Weight: 84.1 kg  Vital Signs: Temp: 98 F (36.7 C) (03/10 1640) Temp Source: Oral (03/10 1537) BP: 138/74 (03/10 1640) Pulse Rate: 102 (03/10 1640)  Labs: Recent Labs    11/18/18 1722  11/18/18 1927 11/18/18 2255 11/19/18 0407 11/19/18 1012 11/19/18 1652  HGB 10.9*  --   --   --   --   --   --   HCT 35.6*  --   --   --   --   --   --   PLT 278  --   --   --   --   --   --   APTT  --    < > 43*  --  99* 80* 77*  LABPROT  --   --  18.2*  --   --   --   --   INR  --   --  1.5*  --   --   --   --   HEPARINUNFRC  --   --  >3.60*  --  3.18*  --   --   CREATININE 3.13*  --   --   --   --   --   --   TROPONINI 1.44*  --   --  3.84* 7.68* 8.26*  --    < > = values in this interval not displayed.    Estimated Creatinine Clearance: 21.3 mL/min (A) (by C-G formula based on SCr of 3.13 mg/dL (H)).   Medical History: Past Medical History:  Diagnosis Date  . Amputation of left lower extremity below knee ([place])   . Amputation of right lower extremity below knee ([place])   . CAD (coronary artery disease)    a. 2004 Cardiac Arrest/CABG x 3 (LIMA->LAD, VG->OM, VG->RCA);  b. 06/2015 lexiscan MV: no significant ischemia, EF 48%, low risk->Med Rx.  . Chronic combined systolic and diastolic CHF (congestive heart failure) ([place])    a. 12/2014 Echo: EF 45-50%;  b. 08/2015 Echo: EF 45-50%, ant, antsept HK, mildly dil LA, nl RV, mild-mod TR, sev PAH (64mmHg); b. 08/2017 TEE:  EF 40-45%, large PFO w/ L->R shunting.  . CKD (chronic kidney disease), stage IV ([place])   . CLL (chronic lymphocytic leukemia) ([place])   . Diabetes mellitus without complication ([place])   . Essential hypertension   . GERD (gastroesophageal reflux disease)   . Hiatal hernia   . History of cardiac arrest    a. 2004.  Marland Kitchen Hyperlipidemia   . Ischemic cardiomyopathy    a. s/p MDT ICD (originally had 5400 lead-->gen change and lead revision ~ 2012 @ [place]); b. 12/2014 Echo: EF 45-50%;  c.08/2015 Echo: EF 45-50%; d. 08/2017 Echo: EF 40-45%.  Marland Kitchen PAD (peripheral artery disease) ([facility])    a. s/p bilat BKA  . Persistent atrial fibrillation    a. Dx 12/2014.  CHA2DS2VASc = 6--> warfarin;  b. 09/2015 s/p DCCV-->on amio; c. s/p DCCV 04/25/16; d. 07/2016 s/p RFCA/PVI in setting of recurrent afib  despite amio; e. 05/2018 Recurrent afib.    Assessment: Patient is a 73yo female admitted with chest pain. Pharmacy consulted for Heparin dosing. Noted that patient was taking Apixaban 5mg  bid prior to admission, last dose was 3/9 at 07:00.  3/10 @ 0400 aPTT 99 seconds therapeutic. 3/10 @ 1012 aPTT 80 seconds therapeutic. 3/10 @ 1652 aPTT 77 seconds therapeutic.   Goal of Therapy:  Heparin level 0.3-0.7 units/ml when aPTT correlates with heparin level aPTT 66-102 sec Monitor platelets by anticoagulation protocol: Yes   Plan:  Will continue current rate and will recheck aPTT with AM labs and HL w/ am labs. Trops continue to trend up 3.84 >> 8.26, will continue to monitor.  Eleonore Chiquito, PharmD, BCPS Clinical Pharmacist 11/19/2018

## 2018-11-19 NOTE — Consult Note (Signed)
Cardiology Consultation:   Patient ID: Caroline Hernandez MRN: [id number]; DOB: 1945-10-27  Admit date: 11/18/2018 Date of Consult: 11/19/2018  Primary Care Provider: Apolonio Schneiders, MD Primary Cardiologist: Ida Rogue, MD -[facility] Physician requesting consult: Dr. Bridgett Larsson Reason for consult: Non-STEMI   Patient Profile:   Caroline Hernandez is a 73 y.o. female with a hx of  73 y.o. 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,  atrial fibrillation starting possibly in April 2016,  noted again June 2016,  acute on chronic diastolic CHF initially April 2016,   admitted to the hospital with diastolic CHF/leg swelling,  abdominal bloating, angina,  She has bilateral prosthesis She has pacer ICD ablation for atrial fibrillation 07/18/2016 Recurrent atrial fibrillation when last seen in clinic September 2019, persistent since that time Presenting to the hospital with back pain, left chest pain, nausea vomiting,  non-STEMI    History of Present Illness:   Caroline Hernandez with  hospitalization , August 2019 for community-acquired pneumonia, sepsis,  Readmission to the hospital 10/16/2018  urinary tract infection Klebsiella and urine cultures treated with antibiotics,  worsening renal failure  persistent atrial fibrillation, Readmission to the hospital 1 month ago February 2020 with respiratory distress, UTI, acute on chronic renal failure,   Recent echocardiogram with ejection fraction 35 to 40% anterior wall hypokinesis not previously seen on TEE October 09, 2016  Reports that she is doing relatively well at home until she developed right shoulder back pain past day or so, left-sided chest pain yesterday, stomach upset, vomiting Reports last dialysis on Friday Been trying to do peritoneal dialysis at home  Initial troponin in the emergency room 1.44 Started on heparin infusion Eliquis held.  Last dose of Eliquis  yesterday morning November 18, 2018  She denies chest pain prior to recent onset Pain resolved overnight  Recently seen by Dr. Caryl Comes November 07, 2018 Decision made to proceed with rate controlling strategy rather than cardioversion and maintaining normal sinus rhythm She is not on amiodarone   Past Medical History:  Diagnosis Date  . Amputation of left lower extremity below knee ([place])   . Amputation of right lower extremity below knee ([place])   . CAD (coronary artery disease)    a. 2004 Cardiac Arrest/CABG x 3 (LIMA->LAD, VG->OM, VG->RCA);  b. 06/2015 lexiscan MV: no significant ischemia, EF 48%, low risk->Med Rx.  . Chronic combined systolic and diastolic CHF (congestive heart failure) ([place])    a. 12/2014 Echo: EF 45-50%;  b. 08/2015 Echo: EF 45-50%, ant, antsept HK, mildly dil LA, nl RV, mild-mod TR, sev PAH (78mmHg); b. 08/2017 TEE: EF 40-45%, large PFO w/ L->R shunting.  . CKD (chronic kidney disease), stage IV ([place])   . CLL (chronic lymphocytic leukemia) ([place])   . Diabetes mellitus without complication ([place])   . Essential hypertension   . GERD (gastroesophageal reflux disease)   . Hiatal hernia   . History of cardiac arrest    a. 2004.  Marland Kitchen Hyperlipidemia   . Ischemic cardiomyopathy    a. s/p MDT ICD (originally had 7517 lead-->gen change and lead revision ~ 2012 @ [facility]); b. 12/2014 Echo: EF 45-50%;  c.08/2015 Echo: EF 45-50%; d. 08/2017 Echo: EF 40-45%.  Marland Kitchen PAD (peripheral artery disease) ([facility])    a. s/p bilat BKA  . Persistent atrial fibrillation    a. Dx 12/2014.  CHA2DS2VASc = 6--> warfarin;  b. 09/2015 s/p DCCV-->on amio;  c. s/p DCCV 04/25/16; d. 07/2016 s/p RFCA/PVI in setting of recurrent afib despite amio; e. 05/2018 Recurrent afib.    Past Surgical History:  Procedure Laterality Date  . ABDOMINAL HYSTERECTOMY    . Amputation lower extremity bilaterally Bilateral   . CAPD INSERTION N/A 10/25/2018   Procedure: LAPAROSCOPIC INSERTION CONTINUOUS AMBULATORY PERITONEAL DIALYSIS   (CAPD) CATHETER;  Surgeon: Katha Cabal, MD;  Location: [facility] ORS;  Service: Vascular;  Laterality: N/A;  . CARDIAC CATHETERIZATION    . CARDIOVERSION N/A 10/09/2016   Procedure: CARDIOVERSION;  Surgeon: Lelon Perla, MD;  Location: [facility] ENDOSCOPY;  Service: Cardiovascular;  Laterality: N/A;  . CORONARY ARTERY BYPASS GRAFT    . DIALYSIS/PERMA CATHETER INSERTION N/A 10/22/2018   Procedure: DIALYSIS/PERMA CATHETER INSERTION;  Surgeon: Katha Cabal, MD;  Location: [place] CV LAB;  Service: Cardiovascular;  Laterality: N/A;  . ELECTROPHYSIOLOGIC STUDY N/A 10/07/2015   Procedure: CARDIOVERSION;  Surgeon: Minna Merritts, MD;  Location: [facility] ORS;  Service: Cardiovascular;  Laterality: N/A;  . ELECTROPHYSIOLOGIC STUDY N/A 04/25/2016   Procedure: Cardioversion;  Surgeon: Minna Merritts, MD;  Location: [facility] ORS;  Service: Cardiovascular;  Laterality: N/A;  . ELECTROPHYSIOLOGIC STUDY N/A 07/18/2016   Procedure: Atrial Fibrillation Ablation;  Surgeon: Thompson Grayer, MD;  Location: [place] CV LAB;  Service: Cardiovascular;  Laterality: N/A;  . IMPLANTABLE CARDIOVERTER DEFIBRILLATOR IMPLANT  2005   [employer]   . TEE WITHOUT CARDIOVERSION N/A 07/18/2016   Procedure: TRANSESOPHAGEAL ECHOCARDIOGRAM (TEE);  Surgeon: Sueanne Margarita, MD;  Location: [facility] ENDOSCOPY;  Service: Cardiovascular;  Laterality: N/A;  . TEE WITHOUT CARDIOVERSION N/A 10/09/2016   Procedure: TRANSESOPHAGEAL ECHOCARDIOGRAM (TEE);  Surgeon: Lelon Perla, MD;  Location: [facility] ENDOSCOPY;  Service: Cardiovascular;  Laterality: N/A;     No current facility-administered medications on file prior to encounter.    Current Outpatient Medications on File Prior to Encounter  Medication Sig Dispense Refill  . amitriptyline (ELAVIL) 25 MG tablet Take 25 mg by mouth at bedtime.   11  . amLODipine (NORVASC) 10 MG tablet TAKE 1 TABLET(10 MG) BY MOUTH DAILY 90 tablet 0  . apixaban (ELIQUIS) 5 MG TABS tablet Take 1 tablet (5 mg total) by  mouth 2 (two) times daily. 60 tablet 9  . atorvastatin (LIPITOR) 40 MG tablet Take 40 mg by mouth at bedtime.   11  . Cholecalciferol (HM VITAMIN D3) 100 MCG (4000 UT) CAPS Take 4,000 Units by mouth daily.     Marland Kitchen gabapentin (NEURONTIN) 100 MG capsule Take 100 mg by mouth 2 (two) times daily.    . insulin aspart (NOVOLOG) 100 UNIT/ML injection Inject 5-10 Units into the skin 3 (three) times daily before meals. 10 units into the skin before breakfast then 5 units before lunch then 10 units before supper (evening meal)    . insulin glargine (LANTUS) 100 UNIT/ML injection Inject 0.15 mLs (15 Units total) into the skin 2 (two) times daily. 10 mL 1  . iron polysaccharides (NIFEREX) 150 MG capsule Take 150 mg by mouth daily.    . isosorbide mononitrate (IMDUR) 30 MG 24 hr tablet TAKE 2 TABLETS(60 MG) BY MOUTH TWICE DAILY 360 tablet 1  . Multiple Vitamins-Minerals (VITEYES AREDS FORMULA/LUTEIN) CAPS Take 1 capsule by mouth 2 (two) times daily.    . nitroGLYCERIN (NITROSTAT) 0.4 MG SL tablet Place 1 tablet (0.4 mg total) under the tongue every 5 (five) minutes as needed for chest pain. 30 tablet 0  . pantoprazole (PROTONIX) 40 MG tablet Take 40  mg by mouth at bedtime.    . sertraline (ZOLOFT) 100 MG tablet Take 100 mg by mouth at bedtime.   11  . sulfamethoxazole-trimethoprim (BACTRIM DS,SEPTRA DS) 800-160 MG tablet Take 1 tablet by mouth 2 (two) times daily.    Marland Kitchen torsemide (DEMADEX) 20 MG tablet Take 2 tablets (40 mg total) by mouth 2 (two) times daily. 60 tablet 3  . vitamin C (ASCORBIC ACID) 500 MG tablet Take 500 mg by mouth daily.      Inpatient Medications: Scheduled Meds: . amitriptyline  25 mg Oral QHS  . amLODipine  10 mg Oral Daily  . [START ON 11/20/2018] aspirin  81 mg Oral Pre-Cath  . aspirin EC  81 mg Oral Daily  . atorvastatin  40 mg Oral q1800  . Chlorhexidine Gluconate Cloth  6 each Topical Q0600  . gabapentin  100 mg Oral BID  . insulin aspart  0-5 Units Subcutaneous QHS  . insulin  aspart  0-9 Units Subcutaneous TID WC  . insulin glargine  15 Units Subcutaneous BID  . iron polysaccharides  150 mg Oral Daily  . isosorbide mononitrate  30 mg Oral Daily  . metoprolol tartrate  12.5 mg Oral BID  . pantoprazole  40 mg Oral QHS  . sertraline  100 mg Oral QHS  . sodium chloride flush  3 mL Intravenous Once  . sodium chloride flush  3 mL Intravenous Q12H   Continuous Infusions: . sodium chloride    . heparin 1,100 Units/hr (11/18/18 2001)   PRN Meds: sodium chloride, acetaminophen, ALPRAZolam, guaiFENesin-dextromethorphan, heparin, hydrOXYzine, nitroGLYCERIN, ondansetron (ZOFRAN) IV, sodium chloride flush, zolpidem  Allergies:    Allergies  Allergen Reactions  . Piperacillin Other (See Comments)    Renal failure  . 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 See ID note**PER PT-CAUSED RENAL FAILURE**  . Zosyn [Piperacillin Sod-Tazobactam So] Other (See Comments)    Renal failure    Social History:   Social History   Socioeconomic History  . Marital status: Divorced    Spouse name: Not on file  . Number of children: Not on file  . Years of education: Not on file  . Highest education level: Not on file  Occupational History  . Not on file  Social Needs  . Financial resource strain: Not on file  . Food insecurity:    Worry: Not on file    Inability: Not on file  . Transportation needs:    Medical: Not on file    Non-medical: Not on file  Tobacco Use  . Smoking status: Never Smoker  . Smokeless tobacco: Never Used  Substance and Sexual Activity  . Alcohol use: No  . Drug use: No  . Sexual activity: Not on file  Lifestyle  . Physical activity:    Days per week: Not on file    Minutes per session: Not on file  . Stress: Not on file  Relationships  . Social connections:    Talks on phone: Not on file    Gets together: Not on file    Attends religious service: Not on file    Active member of club or  organization: Not on file    Attends meetings of clubs or organizations: Not on file    Relationship status: Not on file  . Intimate partner violence:    Fear of current or ex partner: Not on file    Emotionally abused: Not on file    Physically abused:  Not on file    Forced sexual activity: Not on file  Other Topics Concern  . Not on file  Social History Narrative  . Not on file    Family History:    Family History  Problem Relation Age of Onset  . CAD Mother   . Diabetes Mother   . Atrial fibrillation Mother   . Lung cancer Father   . Diabetes Father      ROS:  Please see the history of present illness.  Review of Systems  Constitutional: Negative.   Respiratory: Negative.   Cardiovascular: Positive for chest pain.  Gastrointestinal: Positive for abdominal pain and vomiting.  Musculoskeletal: Positive for back pain.  Neurological: Negative.   Psychiatric/Behavioral: Negative.   All other systems reviewed and are negative.   Physical Exam/Data:   Vitals:   11/18/18 2208 11/19/18 0328 11/19/18 0741 11/19/18 1034  BP: 126/76 128/62 123/78   Pulse: (!) 101 81 85   Resp:   19   Temp: 99.3 F (37.4 C) 99.1 F (37.3 C) 98 F (36.7 C)   TempSrc: Oral Oral Oral   SpO2: 92% 90% 96%   Weight: 103.9 kg   105.2 kg  Height: 5\' 8"  (1.727 m)       Intake/Output Summary (Last 24 hours) at 11/19/2018 1102 Last data filed at 11/19/2018 1008 Gross per 24 hour  Intake 857.36 ml  Output 100 ml  Net 757.36 ml   Last 3 Weights 11/19/2018 11/18/2018 11/18/2018  Weight (lbs) 231 lb 14.8 oz 229 lb 1.6 oz 207 lb  Weight (kg) 105.2 kg 103.919 kg 93.895 kg     Body mass index is 35.26 kg/m.  General:  Well nourished, well developed, in no acute distress,  HEENT: normal Lymph: no adenopathy Neck: Unable to estimate JVD, grossly normal Endocrine:  No thryomegaly Vascular: No carotid bruits; FA pulses 2+ bilaterally without bruits  Cardiac: Irregularly irregular, bradycardic no  murmur  Lungs:  clear to auscultation bilaterally, no wheezing, rhonchi or rales  Abd: soft, nontender, no hepatomegaly  Ext: no edema Musculoskeletal: Amputation lower extremities below the knee Skin: warm and dry  Neuro: Limited exam performed Psych:  Normal affect   EKG:  The EKG was personally reviewed and demonstrates:  Atrial fib with intraventricular conduction delay, left anterior fascicular block Telemetry:  Telemetry was personally reviewed and demonstrates: Atrial fibrillation   Relevant CV Studies: Echocardiogram October 11, 2018  1. The left ventricle has moderately reduced systolic function of 21-30%. The cavity size is mildly increased. There is no left ventricular wall thickness. The left ventricular diastology could not be evaluated due to nondiagnostic images.  2. There is severe hypokinesis of the entire septal, anteroseptal and anterior left ventricular segments.  3. Mildly dilated left atrial size.  4. The mitral valve normal in structure. Regurgitation is mild by color flow Doppler.  5. Normal tricuspid valve.  6. Tricuspid regurgitation mild-moderate.  7. The aortic valve normal. There is moderate sclerosis of the aortic valve.  8. The right ventricle is mildly enlarged in size. There is normal systolic function. Right ventricular systolic pressure is mildly elevated with an estimated pressure of 42.4 mmHg.   Laboratory Data:  Chemistry Recent Labs  Lab 11/18/18 1722  NA 135  K 4.7  CL 96*  CO2 26  GLUCOSE 126*  BUN 29*  CREATININE 3.13*  CALCIUM 9.4  GFRNONAA 14*  GFRAA 17*  ANIONGAP 13    No results for input(s): PROT, ALBUMIN, AST, ALT,  ALKPHOS, BILITOT in the last 168 hours. Hematology Recent Labs  Lab 11/18/18 1722  WBC 46.1*  RBC 4.15  HGB 10.9*  HCT 35.6*  MCV 85.8  MCH 26.3  MCHC 30.6  RDW 19.9*  PLT 278   Cardiac Enzymes Recent Labs  Lab 11/18/18 1722 11/18/18 2255 11/19/18 0407 11/19/18 1012  TROPONINI 1.44* 3.84*  7.68* 8.26*   No results for input(s): TROPIPOC in the last 168 hours.  BNPNo results for input(s): BNP, PROBNP in the last 168 hours.  DDimer No results for input(s): DDIMER in the last 168 hours.  Radiology/Studies:  Dg Chest 2 View  Result Date: 11/18/2018 CLINICAL DATA:  Chest pain. EXAM: CHEST - 2 VIEW COMPARISON:  October 29, 2018 FINDINGS: Stable right dialysis catheter. The heart, hila, mediastinum, and left AICD device are stable. No overt edema. Mild atelectasis in the bases. IMPRESSION: No active cardiopulmonary disease. Electronically Signed   By: Dorise Bullion III M.D   On: 11/18/2018 17:35   US Abdomen Limited Ruq  Result Date: 11/18/2018 CLINICAL DATA:  Right upper quadrant pain EXAM: ULTRASOUND ABDOMEN LIMITED RIGHT UPPER QUADRANT COMPARISON:  None. FINDINGS: Gallbladder: 1or 2 small stones measuring up to 6 mm seen in the gallbladder without wall thickening, pericholecystic fluid, or Murphy's sign. Common bile duct: Diameter: 1.1 mm Liver: No focal lesion identified. Within normal limits in parenchymal echogenicity. Portal vein is patent on color Doppler imaging with normal direction of blood flow towards the liver. IMPRESSION: Cholelithiasis without wall thickening, pericholecystic fluid, or Murphy's sign. Electronically Signed   By: Dorise Bullion III M.D   On: 11/18/2018 19:06    Assessment and Plan:   1. NSTEMI Troponin trending upwards, now 7 Known coronary artery disease, history of bypass, vasculopathy Moderately depressed ejection fraction 35% up to 40% on recent echocardiogram October 11, 2018 consistent with ischemic cardiomyopathy --- Discussed various treatment options with her, given anginal symptoms, elevated troponin consistent with non-STEMI, would consider cardiac catheterization Risk and benefit discussed with her I have reviewed the risks, indications, and alternatives to cardiac catheterization, possible angioplasty, and stenting with the patient. Risks  include but are not limited to bleeding, infection, vascular injury, stroke, myocardial infection, arrhythmia, kidney injury, radiation-related injury in the case of prolonged fluoroscopy use, emergency cardiac surgery, and death. The patient understands the risks of serious complication is 1-2 in 9211 with diagnostic cardiac cath and 1-2% or less with angioplasty/stenting.  --- Given the severity of her pain, she is willing to proceed with catheterization Will discuss with nephrology We will tentatively schedule for tomorrow given she took Eliquis yesterday morning and would ideally want a 48-hour washout --Continue heparin infusion with aspirin   2. Atrial fibrillation, persistent On Eliquis, on hold for catheterization tomorrow in light of non-STEMI and chest pain consistent with angina Rate control strategy It does not appear that she is on any rate controlling medications as outpatient Metoprolol added given non-STEMI  2) cardiomyopathy, ischemic Ejection fraction low 35 to 40%, 2018 was 40 to 45%  underlying coronary disease, history of bypass Catheterization planned as above  3) PAD History of bilateral below the knee amputations Uses prostheses bilaterally  4) CAD with stable angina Pacer, ICD  6) chronic renal failure On hemodialysis Fluid hemodynamics managed by torsemide 40 twice daily with dialysis Did not eat or drink much yesterday or today, will hold torsemide today consider restarting tomorrow We will discuss with nephrology   Total encounter time more than 110 minutes  Greater than  50% was spent in counseling and coordination of care with the patient   For questions or updates, please contact [place] Please consult [email or web address] for contact info under     Signed, Ida Rogue, MD  11/19/2018 11:02 AM

## 2018-11-19 NOTE — Progress Notes (Signed)
[facility]  ROUNDING NOTE   Subjective:   Patient known to our practice from outpatient dialysis as well as previous admissions.  This time she presents for chest discomfort and right shoulder pain.  Found to have elevated troponins. She was evaluated by cardiologist and is scheduled to have cardiac catheterization tomorrow Patient reports some shortness of breath which is ongoing.  She also reports increasing weight of about 9 pounds over her dry weight.  Objective:  Vital signs in last 24 hours:  Temp:  [98 F (36.7 C)-99.3 F (37.4 C)] 98 F (36.7 C) (03/10 0741) Pulse Rate:  [81-115] 85 (03/10 0741) Resp:  [19-27] 19 (03/10 0741) BP: (118-158)/(56-92) 123/78 (03/10 0741) SpO2:  [87 %-96 %] 96 % (03/10 0741) Weight:  [93.9 kg-103.9 kg] 103.9 kg (03/09 2208)  Weight change:  Filed Weights   11/18/18 1702 11/18/18 2208  Weight: 93.9 kg 103.9 kg    Intake/Output: I/O last 3 completed shifts: In: 617.4 [I.V.:117.4; IV Piggyback:500] Out: 100 [Urine:100]   Intake/Output this shift:  No intake/output data recorded.  Physical Exam: General: No acute distress  Head: Normocephalic, atraumatic. Moist oral mucosal membranes  Eyes: Anicteric  Neck: Supple,  Lungs:   Diffuse bilateral crackles, normal effort, oxygen by nasal cannula  Heart:  Irregular rhythm 2/6 murmur,  Abdomen:  Soft, PD catheter in place  Extremities: B/L BKA  Neurologic: Awake, alert, following commands  Skin: No lesions  Access:  RIJ permcath 2/12 Dr. Delana Hernandez, PD catheter Dr. Delana Hernandez 6/19    Basic Metabolic Panel: Recent Labs  Lab 11/18/18 1722 11/18/18 2255  NA 135  --   K 4.7  --   CL 96*  --   CO2 26  --   GLUCOSE 126*  --   BUN 29*  --   CREATININE 3.13*  --   CALCIUM 9.4  --   MG  --  2.0    Liver Function Tests: No results for input(s): AST, ALT, ALKPHOS, BILITOT, PROT, ALBUMIN in the last 168 hours. No results for input(s): LIPASE, AMYLASE in the last 168 hours. No  results for input(s): AMMONIA in the last 168 hours.  CBC: Recent Labs  Lab 11/18/18 1722  WBC 46.1*  HGB 10.9*  HCT 35.6*  MCV 85.8  PLT 278    Cardiac Enzymes: Recent Labs  Lab 11/18/18 1722 11/18/18 2255 11/19/18 0407  TROPONINI 1.44* 3.84* 7.68*    BNP: Invalid input(s): POCBNP  CBG: Recent Labs  Lab 11/18/18 2207 11/19/18 0743  GLUCAP 144* 102*    Microbiology: Results for orders placed or performed during the hospital encounter of 11/18/18  MRSA PCR Screening     Status: None   Collection Time: 11/18/18 10:32 PM  Result Value Ref Range Status   MRSA by PCR NEGATIVE NEGATIVE Final    Comment:        The GeneXpert MRSA Assay (FDA approved for NASAL specimens only), is one component of a comprehensive MRSA colonization surveillance program. It is not intended to diagnose MRSA infection nor to guide or monitor treatment for MRSA infections. Performed at [facility], [address] [place], [place] [address]     Coagulation Studies: Recent Labs    11/18/18 1927  LABPROT 18.2*  INR 1.5*    Urinalysis: Recent Labs    11/19/18 0344  COLORURINE YELLOW*  LABSPEC 1.015  PHURINE 5.0  GLUCOSEU NEGATIVE  HGBUR NEGATIVE  BILIRUBINUR NEGATIVE  KETONESUR NEGATIVE  PROTEINUR 100*  NITRITE NEGATIVE  LEUKOCYTESUR SMALL*      Imaging: Dg Chest 2 View  Result Date: 11/18/2018 CLINICAL DATA:  Chest pain. EXAM: CHEST - 2 VIEW COMPARISON:  October 29, 2018 FINDINGS: Stable right dialysis catheter. The heart, hila, mediastinum, and left AICD device are stable. No overt edema. Mild atelectasis in the bases. IMPRESSION: No active cardiopulmonary disease. Electronically Signed   By: Dorise Bullion III M.D   On: 11/18/2018 17:35   US Abdomen Limited Ruq  Result Date: 11/18/2018 CLINICAL DATA:  Right upper quadrant pain EXAM: ULTRASOUND ABDOMEN LIMITED RIGHT UPPER QUADRANT COMPARISON:  None. FINDINGS: Gallbladder: 1or 2 small stones measuring up  to 6 mm seen in the gallbladder without wall thickening, pericholecystic fluid, or Murphy's sign. Common bile duct: Diameter: 1.1 mm Liver: No focal lesion identified. Within normal limits in parenchymal echogenicity. Portal vein is patent on color Doppler imaging with normal direction of blood flow towards the liver. IMPRESSION: Cholelithiasis without wall thickening, pericholecystic fluid, or Murphy's sign. Electronically Signed   By: Dorise Bullion III M.D   On: 11/18/2018 19:06     Medications:   . sodium chloride    . heparin 1,100 Units/hr (11/18/18 2001)   . amitriptyline  25 mg Oral QHS  . amLODipine  10 mg Oral Daily  . [START ON 11/20/2018] aspirin  81 mg Oral Pre-Cath  . aspirin EC  81 mg Oral Daily  . atorvastatin  40 mg Oral q1800  . gabapentin  100 mg Oral BID  . insulin aspart  0-5 Units Subcutaneous QHS  . insulin aspart  0-9 Units Subcutaneous TID WC  . insulin glargine  15 Units Subcutaneous BID  . iron polysaccharides  150 mg Oral Daily  . isosorbide mononitrate  30 mg Oral Daily  . metoprolol tartrate  12.5 mg Oral BID  . pantoprazole  40 mg Oral QHS  . sertraline  100 mg Oral QHS  . sodium chloride flush  3 mL Intravenous Once  . sodium chloride flush  3 mL Intravenous Q12H   sodium chloride, acetaminophen, ALPRAZolam, guaiFENesin-dextromethorphan, hydrOXYzine, nitroGLYCERIN, ondansetron (ZOFRAN) IV, sodium chloride flush, zolpidem  Assessment/ Plan:  [name] is a 73 y.o. [place] female with atrial fibrillation, hypertension, diabetes mellitus type II insulin dependent, diabetic neuropathy, congestive heart failure, coronary artery disease status post CABG, GERD, CLL, bilateral below the knee amputations admitted with cough, shortness of breath, UTI and initiated on hemodialysis this admission.   1.  End Stage Renal Disease with volume overload We will dialyze the patient today for volume optimization.  Patient appears to be volume overloaded.   Ultrafiltration goal 2 to 2.5 kg as tolerated.  She will undergo hemodialysis.  She is also getting IV vancomycin for an exit site infection of her PD catheter.  We will reconsider hemodialysis tomorrow after coronary angiography.  2.  Anemia of chronic kidney disease with CLL and leukocytosis, Thrombocytopenia   Current hemoglobin 10.9 -Follow closely -Consider starting Procrit as outpatient in collaboration with hematology  3.   Non-STEMI Evaluated by Dr. Rockey Situ.  Scheduled for cardiac catheterization tomorrow  4. Diabetes mellitus type II with chronic kidney disease: insulin dependent. history of poor control.   5.  Cardiac history: Atrial fibrillation:  - transitioned to PO apixaban. -Amiodarone discontinued due to bradycardia Chronic systolic CHF with LVEF 35 to 40%, severe hypokinesis of septal, anteroseptal and anterior left ventricular segments, mildly dilated left atrium, moderate aortic sclerosis, mildly elevated right ventricular systolic pressure of 73.2 mm.  Last echocardiogram  October 11, 2018 -Non-STEMI November 18, 2018     LOS: 1 Caroline Hernandez 3/10/20209:56 AM

## 2018-11-19 NOTE — Progress Notes (Signed)
ANTICOAGULATION CONSULT NOTE - Initial Consult  Pharmacy Consult for Heparin Drip Indication: chest pain/ACS  Allergies  Allergen Reactions  . Piperacillin Other (See Comments)    Renal failure  . 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 See ID note**PER PT-CAUSED RENAL FAILURE**  . Zosyn [Piperacillin Sod-Tazobactam So] Other (See Comments)    Renal failure    Patient Measurements: Height: 5\' 8"  (172.7 cm) Weight: 231 lb 14.8 oz (105.2 kg) IBW/kg (Calculated) : 63.9 Heparin Dosing Weight: 84.1 kg  Vital Signs: Temp: 98 F (36.7 C) (03/10 0741) Temp Source: Oral (03/10 0741) BP: 123/78 (03/10 0741) Pulse Rate: 85 (03/10 0741)  Labs: Recent Labs    11/18/18 1722 11/18/18 1927 11/18/18 2255 11/19/18 0407 11/19/18 1012  HGB 10.9*  --   --   --   --   HCT 35.6*  --   --   --   --   PLT 278  --   --   --   --   APTT  --  43*  --  99* 80*  LABPROT  --  18.2*  --   --   --   INR  --  1.5*  --   --   --   HEPARINUNFRC  --  >3.60*  --  3.18*  --   CREATININE 3.13*  --   --   --   --   TROPONINI 1.44*  --  3.84* 7.68* 8.26*    Estimated Creatinine Clearance: 21.2 mL/min (A) (by C-G formula based on SCr of 3.13 mg/dL (H)).   Medical History: Past Medical History:  Diagnosis Date  . Amputation of left lower extremity below knee ([place])   . Amputation of right lower extremity below knee ([place])   . CAD (coronary artery disease)    a. 2004 Cardiac Arrest/CABG x 3 (LIMA->LAD, VG->OM, VG->RCA);  b. 06/2015 lexiscan MV: no significant ischemia, EF 48%, low risk->Med Rx.  . Chronic combined systolic and diastolic CHF (congestive heart failure) ([place])    a. 12/2014 Echo: EF 45-50%;  b. 08/2015 Echo: EF 45-50%, ant, antsept HK, mildly dil LA, nl RV, mild-mod TR, sev PAH (9mmHg); b. 08/2017 TEE: EF 40-45%, large PFO w/ L->R shunting.  . CKD (chronic kidney disease), stage IV ([place])   . CLL (chronic lymphocytic leukemia) ([place])   . Diabetes  mellitus without complication ([place])   . Essential hypertension   . GERD (gastroesophageal reflux disease)   . Hiatal hernia   . History of cardiac arrest    a. 2004.  Marland Kitchen Hyperlipidemia   . Ischemic cardiomyopathy    a. s/p MDT ICD (originally had 8242 lead-->gen change and lead revision ~ 2012 @ [place]); b. 12/2014 Echo: EF 45-50%;  c.08/2015 Echo: EF 45-50%; d. 08/2017 Echo: EF 40-45%.  Marland Kitchen PAD (peripheral artery disease) ([facility])    a. s/p bilat BKA  . Persistent atrial fibrillation    a. Dx 12/2014.  CHA2DS2VASc = 6--> warfarin;  b. 09/2015 s/p DCCV-->on amio; c. s/p DCCV 04/25/16; d. 07/2016 s/p RFCA/PVI in setting of recurrent afib despite amio; e. 05/2018 Recurrent afib.    Assessment: Patient is a 73yo female admitted with chest pain. Pharmacy consulted for Heparin dosing. Noted that patient was taking Apixaban 5mg  bid prior to admission, last dose was 3/9 at 07:00.  03/10 @ 0400 aPTT 99 seconds therapeutic. 03/10 @ 1012 aPTT 80 seconds therapeutic.  Goal of Therapy:  Heparin level 0.3-0.7 units/ml when aPTT  correlates with heparin level aPTT 66-102 sec Monitor platelets by anticoagulation protocol: Yes   Plan:  Will continue current rate and will recheck aPTT @ 1600 and HL w/ am labs. Trops continue to trend up 3.84 >> 8.26, will continue to monitor.  Rito Ehrlich, PharmD Clinical Pharmacist 11/19/2018

## 2018-11-20 ENCOUNTER — Encounter
Admission: EM | Disposition: A | Discharge status: Home Health | Payer: Self-pay | Source: Home / Self Care | Attending: Internal Medicine

## 2018-11-20 DIAGNOSIS — J9601 Acute respiratory failure with hypoxia: Secondary | ICD-10-CM

## 2018-11-20 DIAGNOSIS — Z955 Presence of coronary angioplasty implant and graft: Secondary | ICD-10-CM

## 2018-11-20 DIAGNOSIS — Z992 Dependence on renal dialysis: Secondary | ICD-10-CM

## 2018-11-20 DIAGNOSIS — I4819 Other persistent atrial fibrillation: Secondary | ICD-10-CM

## 2018-11-20 DIAGNOSIS — I25710 Atherosclerosis of autologous vein coronary artery bypass graft(s) with unstable angina pectoris: Secondary | ICD-10-CM

## 2018-11-20 HISTORY — PX: CORONARY STENT INTERVENTION: CATH118234

## 2018-11-20 HISTORY — PX: LEFT HEART CATH AND CORS/GRAFTS ANGIOGRAPHY: CATH118250

## 2018-11-20 LAB — BASIC METABOLIC PANEL
Anion gap: 10 (ref 5–15)
BUN: 24 mg/dL — ABNORMAL HIGH (ref 8–23)
CO2: 26 mmol/L (ref 22–32)
CREATININE: 2.61 mg/dL — AB (ref 0.44–1.00)
Calcium: 8.3 mg/dL — ABNORMAL LOW (ref 8.9–10.3)
Chloride: 97 mmol/L — ABNORMAL LOW (ref 98–111)
GFR calc non Af Amer: 18 mL/min — ABNORMAL LOW (ref >60)
GFR, EST AFRICAN AMERICAN: 21 mL/min — AB (ref >60)
Glucose, Bld: 144 mg/dL — ABNORMAL HIGH (ref 70–99)
Potassium: 4.7 mmol/L (ref 3.5–5.1)
Sodium: 133 mmol/L — ABNORMAL LOW (ref 135–145)

## 2018-11-20 LAB — CBC
HCT: 36.2 % (ref 36.0–46.0)
Hemoglobin: 10.8 g/dL — ABNORMAL LOW (ref 12.0–15.0)
MCH: 26.1 pg (ref 26.0–34.0)
MCHC: 29.8 g/dL — ABNORMAL LOW (ref 30.0–36.0)
MCV: 87.4 fL (ref 80.0–100.0)
Platelets: 230 10*3/uL (ref 150–400)
RBC: 4.14 MIL/uL (ref 3.87–5.11)
RDW: 20.3 % — ABNORMAL HIGH (ref 11.5–15.5)
WBC: 43.8 10*3/uL — ABNORMAL HIGH (ref 4.0–10.5)
nRBC: 0.1 % (ref 0.0–0.2)

## 2018-11-20 LAB — GLUCOSE, CAPILLARY
Glucose-Capillary: 122 mg/dL — ABNORMAL HIGH (ref 70–99)
Glucose-Capillary: 106 mg/dL — ABNORMAL HIGH (ref 70–99)
Glucose-Capillary: 89 mg/dL (ref 70–99)
Glucose-Capillary: 72 mg/dL (ref 70–99)

## 2018-11-20 LAB — POCT ACTIVATED CLOTTING TIME: Activated Clotting Time: 455 seconds

## 2018-11-20 LAB — APTT
APTT: 88 s — AB (ref 24–36)
APTT: 152 s — AB (ref 24–36)
aPTT: 104 seconds — ABNORMAL HIGH (ref 24–36)

## 2018-11-20 LAB — HEPARIN LEVEL (UNFRACTIONATED): Heparin Unfractionated: 3 IU/mL — ABNORMAL HIGH (ref 0.30–0.70)

## 2018-11-20 SURGERY — LEFT HEART CATH AND CORS/GRAFTS ANGIOGRAPHY
Anesthesia: Moderate Sedation

## 2018-11-20 MED ORDER — MIDAZOLAM HCL 2 MG/2ML IJ SOLN
INTRAMUSCULAR | Status: DC | PRN
  Administered 2018-11-20: 0.5 mg via INTRAVENOUS

## 2018-11-20 MED ORDER — SODIUM CHLORIDE 0.9% FLUSH
3.0000 mL | Freq: Two times a day (BID) | INTRAVENOUS | Status: DC
  Administered 2018-11-20 – 2018-11-23 (×6): 3 mL via INTRAVENOUS

## 2018-11-20 MED ORDER — HEPARIN (PORCINE) IN NACL 1000-0.9 UT/500ML-% IV SOLN
INTRAVENOUS | Status: AC
  Filled 2018-11-20: qty 1000

## 2018-11-20 MED ORDER — CLOPIDOGREL BISULFATE 75 MG PO TABS
ORAL_TABLET | ORAL | Status: AC
  Filled 2018-11-20: qty 8

## 2018-11-20 MED ORDER — SODIUM CHLORIDE 0.9 % IV SOLN
0.2500 mg/kg/h | INTRAVENOUS | Status: AC
  Administered 2018-11-20: 0.25 mg/kg/h via INTRAVENOUS
  Filled 2018-11-20 (×2): qty 250

## 2018-11-20 MED ORDER — FENTANYL CITRATE (PF) 100 MCG/2ML IJ SOLN
INTRAMUSCULAR | Status: AC
  Filled 2018-11-20: qty 2

## 2018-11-20 MED ORDER — ASPIRIN 81 MG PO CHEW
CHEWABLE_TABLET | ORAL | Status: DC | PRN
  Administered 2018-11-20: 243 mg via ORAL

## 2018-11-20 MED ORDER — LABETALOL HCL 5 MG/ML IV SOLN: 10.0000 mg | INTRAVENOUS | Status: AC | PRN

## 2018-11-20 MED ORDER — IOPAMIDOL (ISOVUE-300) INJECTION 61%
INTRAVENOUS | Status: DC | PRN
  Administered 2018-11-20: 110 mL via INTRA_ARTERIAL
  Administered 2018-11-20: 150 mL via INTRA_ARTERIAL

## 2018-11-20 MED ORDER — SODIUM CHLORIDE 0.9 % IV SOLN
INTRAVENOUS | Status: AC | PRN
  Administered 2018-11-20: 0.25 mg/kg/h via INTRAVENOUS

## 2018-11-20 MED ORDER — BIVALIRUDIN TRIFLUOROACETATE 250 MG IV SOLR
INTRAVENOUS | Status: AC
  Filled 2018-11-20: qty 250

## 2018-11-20 MED ORDER — ASPIRIN 81 MG PO CHEW
CHEWABLE_TABLET | ORAL | Status: AC
  Filled 2018-11-20: qty 3

## 2018-11-20 MED ORDER — SODIUM CHLORIDE 0.9 % IV SOLN: 250.0000 mL | INTRAVENOUS | Status: DC | PRN

## 2018-11-20 MED ORDER — ONDANSETRON HCL 4 MG/2ML IJ SOLN
4.0000 mg | Freq: Four times a day (QID) | INTRAMUSCULAR | Status: DC | PRN
  Administered 2018-11-21 – 2018-11-22 (×2): 4 mg via INTRAVENOUS
  Filled 2018-11-20 (×2): qty 2

## 2018-11-20 MED ORDER — CLOPIDOGREL BISULFATE 75 MG PO TABS
75.0000 mg | ORAL_TABLET | Freq: Every day | ORAL | Status: DC
  Administered 2018-11-21 – 2018-11-23 (×3): 75 mg via ORAL
  Filled 2018-11-20 (×3): qty 1

## 2018-11-20 MED ORDER — HYDRALAZINE HCL 20 MG/ML IJ SOLN: 5.0000 mg | INTRAMUSCULAR | Status: AC | PRN

## 2018-11-20 MED ORDER — CLOPIDOGREL BISULFATE 75 MG PO TABS
ORAL_TABLET | ORAL | Status: DC | PRN
  Administered 2018-11-20: 600 mg via ORAL

## 2018-11-20 MED ORDER — ASPIRIN 81 MG PO CHEW
81.0000 mg | CHEWABLE_TABLET | Freq: Every day | ORAL | Status: DC
  Administered 2018-11-20: 81 mg via ORAL
  Filled 2018-11-20 (×2): qty 1

## 2018-11-20 MED ORDER — ACETAMINOPHEN 325 MG PO TABS: 650.0000 mg | ORAL_TABLET | ORAL | Status: DC | PRN

## 2018-11-20 MED ORDER — NITROGLYCERIN 5 MG/ML IV SOLN
INTRAVENOUS | Status: AC
  Filled 2018-11-20: qty 10

## 2018-11-20 MED ORDER — MIDAZOLAM HCL 2 MG/2ML IJ SOLN
INTRAMUSCULAR | Status: AC
  Filled 2018-11-20: qty 2

## 2018-11-20 MED ORDER — FENTANYL CITRATE (PF) 100 MCG/2ML IJ SOLN
INTRAMUSCULAR | Status: DC | PRN
  Administered 2018-11-20: 12.5 ug via INTRAVENOUS

## 2018-11-20 MED ORDER — BIVALIRUDIN BOLUS VIA INFUSION - CUPID
INTRAVENOUS | Status: DC | PRN
  Administered 2018-11-20: 77.925 mg via INTRAVENOUS

## 2018-11-20 MED ORDER — SODIUM CHLORIDE 0.9 % WEIGHT BASED INFUSION: 1.0000 mL/kg/h | INTRAVENOUS | Status: DC

## 2018-11-20 MED ORDER — SODIUM CHLORIDE 0.9% FLUSH: 3.0000 mL | INTRAVENOUS | Status: DC | PRN

## 2018-11-20 SURGICAL SUPPLY — 24 items
BALLN MINITREK RX 1.5X12 (BALLOONS) ×3
BALLN TREK RX 2.25X12 (BALLOONS) ×3
BALLOON MINITREK RX 1.5X12 (BALLOONS) IMPLANT
BALLOON TREK RX 2.25X12 (BALLOONS) IMPLANT
CANNULA 5F STIFF (CANNULA) ×2 IMPLANT
CATH INFINITI 5FR JL4 (CATHETERS) ×2 IMPLANT
CATH INFINITI JR4 5F (CATHETERS) ×2 IMPLANT
CATH VISTA GUIDE 6FR JR4 (CATHETERS) ×2 IMPLANT
COVER PROBE U/S 5X48 (MISCELLANEOUS) ×4 IMPLANT
DEVICE CLOSURE MYNXGRIP 6/7F (Vascular Products) ×4 IMPLANT
DEVICE INFLAT 30 PLUS (MISCELLANEOUS) ×2 IMPLANT
GUIDELINER 6F (CATHETERS) ×2 IMPLANT
KIT MANI 3VAL PERCEP (MISCELLANEOUS) ×3 IMPLANT
NDL PERC 18GX7CM (NEEDLE) IMPLANT
NEEDLE PERC 18GX7CM (NEEDLE) ×3 IMPLANT
NEEDLE SMART 18G ACCESS (NEEDLE) ×2 IMPLANT
PACK CARDIAC CATH (CUSTOM PROCEDURE TRAY) ×3 IMPLANT
SHEATH AVANTI 5FR X 11CM (SHEATH) ×2 IMPLANT
SHEATH AVANTI 6FR X 11CM (SHEATH) ×2 IMPLANT
STENT RESOLUTE ONYX 2.5X12 (Permanent Stent) ×2 IMPLANT
WIRE ASAHI PROWATER 180CM (WIRE) ×2 IMPLANT
WIRE G HI TQ BMW 190 (WIRE) ×2 IMPLANT
WIRE GUIDERIGHT .035X150 (WIRE) ×2 IMPLANT
WIRE HITORQ VERSACORE ST 145CM (WIRE) ×2 IMPLANT

## 2018-11-20 NOTE — Progress Notes (Signed)
[facility]  ROUNDING NOTE   Subjective:   Patient known to our practice from outpatient dialysis as well as previous admissions.  This time she presents for chest discomfort and right shoulder pain.  Found to have elevated troponins.  2500 cc of fluid was removed with hemodialysis yesterday.  Patient states that her breathing is better.  She is scheduled for cardiac catheterization today.  Objective:  Vital signs in last 24 hours:  Temp:  [97.8 F (36.6 C)-98.7 F (37.1 C)] 98.4 F (36.9 C) (03/11 1322) Pulse Rate:  [75-110] 75 (03/11 0800) Resp:  [16-23] 17 (03/11 1322) BP: (114-143)/(71-85) 128/78 (03/11 1322) SpO2:  [89 %-97 %] 89 % (03/11 1407) Weight:  [103.5 kg-105.5 kg] 103.9 kg (03/11 1322)  Weight change: 11.3 kg Filed Weights   11/19/18 1641 11/20/18 0521 11/20/18 1322  Weight: 105.5 kg 103.9 kg 103.9 kg    Intake/Output: I/O last 3 completed shifts: In: 966.4 [P.O.:240; I.V.:222.1; IV Piggyback:504.3] Out: 2600 [Urine:100; Other:2500]   Intake/Output this shift:  No intake/output data recorded.  Physical Exam: General: No acute distress  Head: Normocephalic, atraumatic. Moist oral mucosal membranes  Eyes: Anicteric  Neck: Supple,  Lungs:   Mild basilar crackles,, normal effort, oxygen by nasal cannula  Heart:  Irregular rhythm 2/6 murmur,  Abdomen:  Soft, PD catheter in place  Extremities: B/L BKA  Neurologic: Awake, alert, following commands  Skin: No lesions  Access:  RIJ permcath 2/12 Dr. Delana Hernandez, PD catheter Dr. Delana Hernandez 9/21    Basic Metabolic Panel: Recent Labs  Lab 11/18/18 1722 11/18/18 2255 11/20/18 0511  NA 135  --  133*  K 4.7  --  4.7  CL 96*  --  97*  CO2 26  --  26  GLUCOSE 126*  --  144*  BUN 29*  --  24*  CREATININE 3.13*  --  2.61*  CALCIUM 9.4  --  8.3*  MG  --  2.0  --     Liver Function Tests: No results for input(s): AST, ALT, ALKPHOS, BILITOT, PROT, ALBUMIN in the last 168 hours. No results for  input(s): LIPASE, AMYLASE in the last 168 hours. No results for input(s): AMMONIA in the last 168 hours.  CBC: Recent Labs  Lab 11/18/18 1722 11/20/18 0511  WBC 46.1* 43.8*  HGB 10.9* 10.8*  HCT 35.6* 36.2  MCV 85.8 87.4  PLT 278 230    Cardiac Enzymes: Recent Labs  Lab 11/18/18 1722 11/18/18 2255 11/19/18 0407 11/19/18 1012 11/19/18 1652  TROPONINI 1.44* 3.84* 7.68* 8.26* 5.09*    BNP: Invalid input(s): POCBNP  CBG: Recent Labs  Lab 11/19/18 1120 11/19/18 1636 11/19/18 2114 11/20/18 0748 11/20/18 1145  GLUCAP 139* 100* 96 122* 106*    Microbiology: Results for orders placed or performed during the hospital encounter of 11/18/18  MRSA PCR Screening     Status: None   Collection Time: 11/18/18 10:32 PM  Result Value Ref Range Status   MRSA by PCR NEGATIVE NEGATIVE Final    Comment:        The GeneXpert MRSA Assay (FDA approved for NASAL specimens only), is one component of a comprehensive MRSA colonization surveillance program. It is not intended to diagnose MRSA infection nor to guide or monitor treatment for MRSA infections. Performed at [facility], [address], [place], [place] [address]     Coagulation Studies: Recent Labs    11/18/18 1927  LABPROT 18.2*  INR 1.5*    Urinalysis: Recent Labs  11/19/18 0344  [place] 1.015  PHURINE 5.0  GLUCOSEU NEGATIVE  HGBUR NEGATIVE  BILIRUBINUR NEGATIVE  KETONESUR NEGATIVE  PROTEINUR 100*  NITRITE NEGATIVE  LEUKOCYTESUR SMALL*      Imaging: Dg Chest 2 View  Result Date: 11/18/2018 CLINICAL DATA:  Chest pain. EXAM: CHEST - 2 VIEW COMPARISON:  October 29, 2018 FINDINGS: Stable right dialysis catheter. The heart, hila, mediastinum, and left AICD device are stable. No overt edema. Mild atelectasis in the bases. IMPRESSION: No active cardiopulmonary disease. Electronically Signed   By: Dorise Bullion III M.D   On: 11/18/2018 17:35   US Abdomen Limited  Ruq  Result Date: 11/18/2018 CLINICAL DATA:  Right upper quadrant pain EXAM: ULTRASOUND ABDOMEN LIMITED RIGHT UPPER QUADRANT COMPARISON:  None. FINDINGS: Gallbladder: 1or 2 small stones measuring up to 6 mm seen in the gallbladder without wall thickening, pericholecystic fluid, or Murphy's sign. Common bile duct: Diameter: 1.1 mm Liver: No focal lesion identified. Within normal limits in parenchymal echogenicity. Portal vein is patent on color Doppler imaging with normal direction of blood flow towards the liver. IMPRESSION: Cholelithiasis without wall thickening, pericholecystic fluid, or Murphy's sign. Electronically Signed   By: Dorise Bullion III M.D   On: 11/18/2018 19:06     Medications:   . [MAR Hold] sodium chloride    . heparin Stopped (11/20/18 1320)   . [MAR Hold] amitriptyline  25 mg Oral QHS  . [MAR Hold] amLODipine  10 mg Oral Daily  . [MAR Hold] aspirin EC  81 mg Oral Daily  . [MAR Hold] atorvastatin  40 mg Oral q1800  . [MAR Hold] Chlorhexidine Gluconate Cloth  6 each Topical Q0600  . [MAR Hold] gabapentin  100 mg Oral BID  . [MAR Hold] insulin aspart  0-5 Units Subcutaneous QHS  . [MAR Hold] insulin aspart  0-9 Units Subcutaneous TID WC  . [MAR Hold] insulin glargine  15 Units Subcutaneous BID  . [MAR Hold] iron polysaccharides  150 mg Oral Daily  . [MAR Hold] isosorbide mononitrate  30 mg Oral Daily  . [MAR Hold] metoprolol tartrate  12.5 mg Oral BID  . [MAR Hold] pantoprazole  40 mg Oral QHS  . [MAR Hold] sertraline  100 mg Oral QHS  . [MAR Hold] sodium chloride flush  3 mL Intravenous Once  . [MAR Hold] sodium chloride flush  3 mL Intravenous Q12H   [MAR Hold] sodium chloride, [MAR Hold] acetaminophen, [MAR Hold] ALPRAZolam, fentaNYL, [MAR Hold] guaiFENesin-dextromethorphan, [MAR Hold] hydrOXYzine, midazolam, [MAR Hold] nitroGLYCERIN, [MAR Hold] ondansetron (ZOFRAN) IV, [MAR Hold] sodium chloride flush, [MAR Hold] zolpidem  Assessment/ Plan:  [name] is a  73 y.o. [place] female with atrial fibrillation, hypertension, diabetes mellitus type II insulin dependent, diabetic neuropathy, congestive heart failure, coronary artery disease status post CABG, GERD, CLL, bilateral below the knee amputations admitted with cough, shortness of breath, UTI and initiated on hemodialysis this admission.   1.  End Stage Renal Disease with volume overload  Ultrafiltration of 2.5 L yesterday for volume optimization.  Clinically patient is improved today Extra hemodialysis today after coronary angiography  2.  Anemia of chronic kidney disease with CLL and leukocytosis, Thrombocytopenia   Current hemoglobin 10.9 -Follow closely -Consider starting Procrit as outpatient in collaboration with hematology  3.   Non-STEMI Evaluated by Dr. Rockey Situ.  Scheduled for cardiac catheterization  4. Diabetes mellitus type II with chronic kidney disease: insulin dependent. history of poor control.   5.  Cardiac history: Atrial fibrillation:  -  transitioned to PO apixaban. -Amiodarone discontinued due to bradycardia Chronic systolic CHF with LVEF 35 to 40%, severe hypokinesis of septal, anteroseptal and anterior left ventricular segments, mildly dilated left atrium, moderate aortic sclerosis, mildly elevated right ventricular systolic pressure of 21.8 mm.  Last echocardiogram October 11, 2018 -Non-STEMI November 18, 2018; cath November 19, 2017     LOS: 2 Caroline Hernandez Caroline Hernandez 3/11/20203:09 PM

## 2018-11-20 NOTE — Progress Notes (Deleted)
Cardiology Office Note Date:  11/20/2018  Patient ID:  [name], [name] [birth year], MRN [id number] PCP:  Caroline Schneiders, MD  Cardiologist:  Dr. ***, MD  ***refresh   Chief Complaint: ***  History of Present Illness: Caroline Hernandez is a 73 y.o. female with history of ***   Past Medical History:  Diagnosis Date   Amputation of left lower extremity below knee ([place])    Amputation of right lower extremity below knee ([place])    CAD (coronary artery disease)    a. 2004 Cardiac Arrest/CABG x 3 (LIMA->LAD, VG->OM, VG->RCA);  b. 06/2015 lexiscan MV: no significant ischemia, EF 48%, low risk->Med Rx.   Chronic combined systolic and diastolic CHF (congestive heart failure) ([place])    a. 12/2014 Echo: EF 45-50%;  b. 08/2015 Echo: EF 45-50%, ant, antsept HK, mildly dil LA, nl RV, mild-mod TR, sev PAH (69mmHg); b. 08/2017 TEE: EF 40-45%, large PFO w/ L->R shunting.   CKD (chronic kidney disease), stage IV ([facility])    CLL (chronic lymphocytic leukemia) ([facility])    Diabetes mellitus without complication ([place])    Essential hypertension    GERD (gastroesophageal reflux disease)    Hiatal hernia    History of cardiac arrest    a. 2004.   Hyperlipidemia    Ischemic cardiomyopathy    a. s/p MDT ICD (originally had 8416 lead-->gen change and lead revision ~ 2012 @ [place]); b. 12/2014 Echo: EF 45-50%;  c.08/2015 Echo: EF 45-50%; d. 08/2017 Echo: EF 40-45%.   PAD (peripheral artery disease) ([place])    a. s/p bilat BKA   Persistent atrial fibrillation    a. Dx 12/2014.  CHA2DS2VASc = 6--> warfarin;  b. 09/2015 s/p DCCV-->on amio; c. s/p DCCV 04/25/16; d. 07/2016 s/p RFCA/PVI in setting of recurrent afib despite amio; e. 05/2018 Recurrent afib.    Past Surgical History:  Procedure Laterality Date   ABDOMINAL HYSTERECTOMY     Amputation lower extremity bilaterally Bilateral    CAPD INSERTION N/A 10/25/2018   Procedure: LAPAROSCOPIC INSERTION CONTINUOUS AMBULATORY PERITONEAL DIALYSIS  (CAPD) CATHETER;   Surgeon: Caroline Cabal, MD;  Location: [facility];  Service: Vascular;  Laterality: N/A;   CARDIAC CATHETERIZATION     CARDIOVERSION N/A 10/09/2016   Procedure: CARDIOVERSION;  Surgeon: Caroline Perla, MD;  Location: [facility];  Service: Cardiovascular;  Laterality: N/A;   CORONARY ARTERY BYPASS GRAFT     DIALYSIS/PERMA CATHETER INSERTION N/A 10/22/2018   Procedure: DIALYSIS/PERMA CATHETER INSERTION;  Surgeon: Caroline Cabal, MD;  Location: [place] [facility];  Service: Cardiovascular;  Laterality: N/A;   ELECTROPHYSIOLOGIC STUDY N/A 10/07/2015   Procedure: CARDIOVERSION;  Surgeon: Caroline Merritts, MD;  Location: [facility];  Service: Cardiovascular;  Laterality: N/A;   ELECTROPHYSIOLOGIC STUDY N/A 04/25/2016   Procedure: Cardioversion;  Surgeon: Caroline Merritts, MD;  Location: [facility];  Service: Cardiovascular;  Laterality: N/A;   ELECTROPHYSIOLOGIC STUDY N/A 07/18/2016   Procedure: Atrial Fibrillation Ablation;  Surgeon: Caroline Grayer, MD;  Location: [facility];  Service: Cardiovascular;  Laterality: N/A;   IMPLANTABLE CARDIOVERTER DEFIBRILLATOR IMPLANT  2005   [employer]    TEE WITHOUT CARDIOVERSION N/A 07/18/2016   Procedure: TRANSESOPHAGEAL ECHOCARDIOGRAM (TEE);  Surgeon: Caroline Margarita, MD;  Location: [facility];  Service: Cardiovascular;  Laterality: N/A;   TEE WITHOUT CARDIOVERSION N/A 10/09/2016   Procedure: TRANSESOPHAGEAL ECHOCARDIOGRAM (TEE);  Surgeon: Caroline Perla, MD;  Location: [facility] ENDOSCOPY;  Service: Cardiovascular;  Laterality: N/A;    No outpatient medications have been  marked as taking for the 11/21/18 encounter (Appointment) with Caroline Mu, PA-C.    Allergies:   Piperacillin; Piperacillin-tazobactam in dex; and Zosyn [piperacillin sod-tazobactam so]   Social History:  The patient  reports that she has never smoked. She has never used smokeless tobacco. She reports that she does not drink alcohol or use drugs.   Family History:  The  patient's family history includes Atrial fibrillation in her mother; CAD in her mother; Diabetes in her father and mother; Lung cancer in her father.  ROS:   ROS   PHYSICAL EXAM: *** VS:  There were no vitals taken for this visit. BMI: There is no height or weight on file to calculate BMI.  Physical Exam  Well nourished, well developed, in no acute distress HEENT: normocephalic, atraumatic Neck: no JVD, carotid bruits or masses Cardiac: normal S1, S2; RRR; no murmurs, rubs, or gallops Lungs: clear to auscultation bilaterally, no wheezing, rhonchi or rales Abd: soft, nontender, no hepatomegaly, + BS MS: no deformity or atrophy Ext: no edema Skin: warm and dry, no rash Neuro:  moves all extremities spontaneously, no focal abnormalities noted, follows commands Psych: euthymic mood, full affect   EKG:  Was ordered and interpreted by me today. Shows ***  Recent Labs: 10/16/2018: B Natriuretic Peptide 1,501.0 10/17/2018: ALT 25 11/18/2018: Magnesium 2.0 11/20/2018: BUN 24; Creatinine, Ser 2.61; Hemoglobin 10.8; Platelets 230; Potassium 4.7; Sodium 133  11/19/2018: Cholesterol 176; HDL 40; LDL Cholesterol 114; Total CHOL/HDL Ratio 4.4; Triglycerides 109; VLDL 22   Estimated Creatinine Clearance: 25.3 mL/min (A) (by C-G formula based on SCr of 2.61 mg/dL (H)).   Wt Readings from Last 3 Encounters:  11/20/18 229 lb (103.9 kg)  11/07/18 201 lb (91.2 kg)  11/07/18 213 lb (96.6 kg)     Other studies reviewed: Additional studies/records reviewed today include: summarized above  ASSESSMENT AND PLAN:  1. ***  Disposition: F/u with *** in   Current medicines are reviewed at length with the patient today.  The patient did not have any concerns regarding medicines.  Caroline Banker PA-C 11/20/2018 9:45 AM     [place] [address] [place], [place] [address] [phone]

## 2018-11-20 NOTE — Progress Notes (Signed)
Saturations Low RN notified. Pt will be placed on Bipap. Will start HD once saturations stabilize.

## 2018-11-20 NOTE — Progress Notes (Signed)
Progress Note  Patient Name: Caroline Hernandez Date of Encounter: 11/20/2018  Primary Cardiologist: Ida Rogue, MD , [facility]  Subjective   Catheterization today, Shortness of breath during the procedure, quiring nonrebreather Stent placed to the vein graft to the OM  Inpatient Medications    Scheduled Meds: . [MAR Hold] amitriptyline  25 mg Oral QHS  . [MAR Hold] amLODipine  10 mg Oral Daily  . [MAR Hold] aspirin EC  81 mg Oral Daily  . [MAR Hold] atorvastatin  40 mg Oral q1800  . [MAR Hold] Chlorhexidine Gluconate Cloth  6 each Topical Q0600  . [MAR Hold] gabapentin  100 mg Oral BID  . [MAR Hold] insulin aspart  0-5 Units Subcutaneous QHS  . [MAR Hold] insulin aspart  0-9 Units Subcutaneous TID WC  . [MAR Hold] insulin glargine  15 Units Subcutaneous BID  . [MAR Hold] iron polysaccharides  150 mg Oral Daily  . [MAR Hold] isosorbide mononitrate  30 mg Oral Daily  . [MAR Hold] metoprolol tartrate  12.5 mg Oral BID  . [MAR Hold] pantoprazole  40 mg Oral QHS  . [MAR Hold] sertraline  100 mg Oral QHS  . [MAR Hold] sodium chloride flush  3 mL Intravenous Once  . [MAR Hold] sodium chloride flush  3 mL Intravenous Q12H   Continuous Infusions: . [MAR Hold] sodium chloride 5 mL/hr at 11/20/18 1554  . bivalirudin (ANGIOMAX) infusion 5 mg/mL 0.25 mg/kg/hr (11/20/18 1545)  . heparin Stopped (11/20/18 1320)   PRN Meds: [MAR Hold] sodium chloride, [MAR Hold] acetaminophen, [MAR Hold] ALPRAZolam, aspirin, bivalirudin (ANGIOMAX) infusion 5 mg/mL, bivalirudin, clopidogrel, fentaNYL, [MAR Hold] guaiFENesin-dextromethorphan, [MAR Hold] hydrOXYzine, iopamidol, midazolam, [MAR Hold] nitroGLYCERIN, [MAR Hold] ondansetron (ZOFRAN) IV, [MAR Hold] sodium chloride flush, [MAR Hold] zolpidem   Vital Signs    Vitals:   11/20/18 0800 11/20/18 1322 11/20/18 1407 11/20/18 1607  BP: 114/77 128/78    Pulse: 75     Resp: 16 17    Temp: 97.8 F (36.6 C) 98.4 F (36.9 C)    TempSrc: Oral  Oral    SpO2: 94% 91% (!) 89% (!) 83%  Weight:  103.9 kg    Height:  5\' 8"  (1.727 m)      Intake/Output Summary (Last 24 hours) at 11/20/2018 1613 Last data filed at 11/20/2018 0700 Gross per 24 hour  Intake 109.01 ml  Output 0 ml  Net 109.01 ml   Last 3 Weights 11/20/2018 11/20/2018 11/19/2018  Weight (lbs) 228 lb 15.9 oz 229 lb 232 lb 9.4 oz  Weight (kg) 103.87 kg 103.874 kg 105.5 kg      Telemetry    Atrial fib - Personally Reviewed  ECG     - Personally Reviewed  Physical Exam   GEN: No acute distress.   Neck: JVD 10+ Cardiac:  Irregularly irregular , no murmurs, rubs, or gallops.  Respiratory:  Coarse breath sounds GI: Soft, nontender, non-distended  MS: No edema; No deformity. Neuro:  Nonfocal  Psych: Normal affect , mild confusion  Labs    Chemistry Recent Labs  Lab 11/18/18 1722 11/20/18 0511  NA 135 133*  K 4.7 4.7  CL 96* 97*  CO2 26 26  GLUCOSE 126* 144*  BUN 29* 24*  CREATININE 3.13* 2.61*  CALCIUM 9.4 8.3*  GFRNONAA 14* 18*  GFRAA 17* 21*  ANIONGAP 13 10     Hematology Recent Labs  Lab 11/18/18 1722 11/20/18 0511  WBC 46.1* 43.8*  RBC 4.15 4.14  HGB 10.9*  10.8*  HCT 35.6* 36.2  MCV 85.8 87.4  MCH 26.3 26.1  MCHC 30.6 29.8*  RDW 19.9* 20.3*  PLT 278 230    Cardiac Enzymes Recent Labs  Lab 11/18/18 2255 11/19/18 0407 11/19/18 1012 11/19/18 1652  TROPONINI 3.84* 7.68* 8.26* 5.09*   No results for input(s): TROPIPOC in the last 168 hours.   BNPNo results for input(s): BNP, PROBNP in the last 168 hours.   DDimer No results for input(s): DDIMER in the last 168 hours.   Radiology    Dg Chest 2 View  Result Date: 11/18/2018 CLINICAL DATA:  Chest pain. EXAM: CHEST - 2 VIEW COMPARISON:  October 29, 2018 FINDINGS: Stable right dialysis catheter. The heart, hila, mediastinum, and left AICD device are stable. No overt edema. Mild atelectasis in the bases. IMPRESSION: No active cardiopulmonary disease. Electronically Signed   By:  Dorise Bullion III M.D   On: 11/18/2018 17:35   US Abdomen Limited Ruq  Result Date: 11/18/2018 CLINICAL DATA:  Right upper quadrant pain EXAM: ULTRASOUND ABDOMEN LIMITED RIGHT UPPER QUADRANT COMPARISON:  None. FINDINGS: Gallbladder: 1or 2 small stones measuring up to 6 mm seen in the gallbladder without wall thickening, pericholecystic fluid, or Murphy's sign. Common bile duct: Diameter: 1.1 mm Liver: No focal lesion identified. Within normal limits in parenchymal echogenicity. Portal vein is patent on color Doppler imaging with normal direction of blood flow towards the liver. IMPRESSION: Cholelithiasis without wall thickening, pericholecystic fluid, or Murphy's sign. Electronically Signed   By: Dorise Bullion III M.D   On: 11/18/2018 19:06    Cardiac Studies   Cath performed today Severe three-vessel native disease with occluded RCA, LAD, left circumflex Patent grafts LIMA to the LAD, vein graft to the OM, vein graft to the RCA Severe disease of anastomosis vein graft to the OM estimated 99%  Recommendations:  Case discussed with Dr. Aris Georgia,  He will attempt intervention on the vein graft to the OM. Very high risk procedure given essentially occluded mid to distal LAD.  Patient Profile     73 y.o. female   [place]    1. NSTEMI Troponin trending upwards, now 7 Known coronary artery disease, history of bypass, vasculopathy Cardiac catheterization today with severe diffuse disease Patent graft to the RCA, patent graft to a severely diseased LAD, patent graft to the OM with 99% disease at the anastomosis -Stent placed at the anastomosis vein graft to the OM Started on Plavix Will resume Eliquis tomorrow  2. Atrial fibrillation, persistent Eliquis on hold given catheterization today Started on Plavix for stent as detailed above Restart Eliquis tomorrow Rate well controlled  2) cardiomyopathy, ischemic Ejection fraction low 35 to 40%  underlying coronary  disease, history of bypass Catheterization with details as above EF may improve with stent placement  3) PAD History of bilateral below the knee amputations Uses prostheses bilaterally  4) CAD with stable angina Pacer, ICD  6) chronic renal failure Significant shortness of breath during the cardiac catheterization case laying supine Case discussed with Dr. Candiss Norse We have placed orders for for bed change to stepdown unit to have urgent dialysis this afternoon following procedure   Total encounter time more than 25 minutes  Greater than 50% was spent in counseling and coordination of care with the patient   For questions or updates, please contact [facility] Please consult [email or web address] for contact info under        Signed, Ida Rogue, MD  11/20/2018, 4:13 PM

## 2018-11-20 NOTE — Progress Notes (Signed)
Pt taken to cath lab. Family at bedside.

## 2018-11-20 NOTE — Plan of Care (Signed)
No complaints of chest pain. Patient is scheduled for catherization on Wednesday at 1200. Heparin drip in place.

## 2018-11-20 NOTE — Progress Notes (Signed)
ANTICOAGULATION CONSULT NOTE - Initial Consult  Pharmacy Consult for Heparin Drip Indication: chest pain/ACS  Allergies  Allergen Reactions  . Piperacillin Other (See Comments)    Renal failure  . 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 See ID note**PER PT-CAUSED RENAL FAILURE**  . Zosyn [Piperacillin Sod-Tazobactam So] Other (See Comments)    Renal failure    Patient Measurements: Height: 5\' 8"  (172.7 cm) Weight: 229 lb (103.9 kg) IBW/kg (Calculated) : 63.9 Heparin Dosing Weight: 84.1 kg  Vital Signs: Temp: 97.8 F (36.6 C) (03/11 0800) Temp Source: Oral (03/11 0800) BP: 114/77 (03/11 0800) Pulse Rate: 75 (03/11 0800)  Labs: Recent Labs    11/18/18 1722  11/18/18 1927  11/19/18 0407 11/19/18 1012 11/19/18 1652 11/20/18 0511 11/20/18 1228  HGB 10.9*  --   --   --   --   --   --  10.8*  --   HCT 35.6*  --   --   --   --   --   --  36.2  --   PLT 278  --   --   --   --   --   --  230  --   APTT  --    < > 43*  --  99* 80* 77* 104* 88*  LABPROT  --   --  18.2*  --   --   --   --   --   --   INR  --   --  1.5*  --   --   --   --   --   --   HEPARINUNFRC  --   --  >3.60*  --  3.18*  --   --  3.00*  --   CREATININE 3.13*  --   --   --   --   --   --  2.61*  --   TROPONINI 1.44*  --   --    < > 7.68* 8.26* 5.09*  --   --    < > = values in this interval not displayed.    Estimated Creatinine Clearance: 25.3 mL/min (A) (by C-G formula based on SCr of 2.61 mg/dL (H)).   Medical History: Past Medical History:  Diagnosis Date  . Amputation of left lower extremity below knee ([place])   . Amputation of right lower extremity below knee ([place])   . CAD (coronary artery disease)    a. 2004 Cardiac Arrest/CABG x 3 (LIMA->LAD, VG->OM, VG->RCA);  b. 06/2015 lexiscan MV: no significant ischemia, EF 48%, low risk->Med Rx.  . Chronic combined systolic and diastolic CHF (congestive heart failure) ([place])    a. 12/2014 Echo: EF 45-50%;  b. 08/2015  Echo: EF 45-50%, ant, antsept HK, mildly dil LA, nl RV, mild-mod TR, sev PAH (25mmHg); b. 08/2017 TEE: EF 40-45%, large PFO w/ L->R shunting.  . CKD (chronic kidney disease), stage IV ([place])   . CLL (chronic lymphocytic leukemia) ([place])   . Diabetes mellitus without complication ([place])   . Essential hypertension   . GERD (gastroesophageal reflux disease)   . Hiatal hernia   . History of cardiac arrest    a. 2004.  Marland Kitchen Hyperlipidemia   . Ischemic cardiomyopathy    a. s/p MDT ICD (originally had 4081 lead-->gen change and lead revision ~ 2012 @ [place]); b. 12/2014 Echo: EF 45-50%;  c.08/2015 Echo: EF 45-50%; d. 08/2017 Echo: EF 40-45%.  Marland Kitchen PAD (peripheral artery disease) ([place])  a. s/p bilat BKA  . Persistent atrial fibrillation    a. Dx 12/2014.  CHA2DS2VASc = 6--> warfarin;  b. 09/2015 s/p DCCV-->on amio; c. s/p DCCV 04/25/16; d. 07/2016 s/p RFCA/PVI in setting of recurrent afib despite amio; e. 05/2018 Recurrent afib.    Assessment: Patient is a 73yo female admitted with chest pain. Pharmacy consulted for Heparin dosing. Noted that patient was taking Apixaban 5mg  bid prior to admission, last dose was 3/9 at 07:00.  3/10 @ 0400 aPTT 99 seconds therapeutic. 3/10 @ 1012 aPTT 80 seconds therapeutic. 3/10 @ 1652 aPTT 77 seconds therapeutic.  03/11 @ 0500 aPTT 104 seconds slightly supratherapeutic. 03/11 @ 1228 aPTT 88 seconds therapeutic.  Goal of Therapy:  Heparin level 0.3-0.7 units/ml when aPTT correlates with heparin level aPTT 66-102 sec Monitor platelets by anticoagulation protocol: Yes   Plan:  Will continue rate at 1000 units/hr and will recheck aPTT @ 2200, CBC stable will continue to monitor.  Rito Ehrlich, PharmD Clinical Pharmacist 11/20/2018

## 2018-11-20 NOTE — Op Note (Signed)
[facility]   OPERATIVE NOTE  DATE: 11/18/2018 - 11/20/2018  PRE-OPERATIVE DIAGNOSIS: Coronary disease, calcified femoral artery with inability to gain access by cardiology team  POST-OPERATIVE DIAGNOSIS: same as above  PROCEDURE: 1.   Ultrasound guidance for vascular access right femoral artery and placement of a right femoral arterial sheath  SURGEON: Leotis Pain  ASSISTANT(S): none  ANESTHESIA: local  ESTIMATED BLOOD LOSS: 5 cc  FINDING(S): 1.  Calcified but patent right femoral artery   INDICATIONS:   Caroline Hernandez is a 73 y.o. female who presents with need for cardiac catheterization and difficulty getting access by the cardiologist.  I am asked to join the procedure to gain access..  Risks and benefits were discussed and informed consent was obtained.  DESCRIPTION: The patient was already prepped and draped by the cardiologist who was having difficulty getting arterial access.  I joined the field.  The right groin had been locally anesthetized with lidocaine.  Ultrasound was used to visualize the right femoral artery which was calcified but patent.  It was then accessed under direct ultrasound guidance without difficulty with a micropuncture needle.  A micropuncture wire and sheath were then placed.  Brisk arterial inflow was seen and I upsized to a 5 [place] sheath over a J-wire.  I then turned the field over to the cardiologist to complete the cardiac catheterization   COMPLICATIONS: None  CONDITION: Stable  Leotis Pain  11/20/2018, 2:52 PM    This note was created with Dragon Medical transcription system. Any errors in dictation are purely unintentional.

## 2018-11-20 NOTE — Progress Notes (Signed)
Pre HD  

## 2018-11-20 NOTE — Progress Notes (Signed)
ANTICOAGULATION CONSULT NOTE - Initial Consult  Pharmacy Consult for Heparin Drip Indication: chest pain/ACS  Allergies  Allergen Reactions  . Piperacillin Other (See Comments)    Renal failure  . 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 See ID note**PER PT-CAUSED RENAL FAILURE**  . Zosyn [Piperacillin Sod-Tazobactam So] Other (See Comments)    Renal failure    Patient Measurements: Height: 5\' 8"  (172.7 cm) Weight: 229 lb (103.9 kg) IBW/kg (Calculated) : 63.9 Heparin Dosing Weight: 84.1 kg  Vital Signs: Temp: 98.4 F (36.9 C) (03/11 0521) Temp Source: Oral (03/11 0521) BP: 129/83 (03/11 0521) Pulse Rate: 76 (03/11 0521)  Labs: Recent Labs    11/18/18 1722  11/18/18 1927  11/19/18 0407 11/19/18 1012 11/19/18 1652 11/20/18 0511  HGB 10.9*  --   --   --   --   --   --  10.8*  HCT 35.6*  --   --   --   --   --   --  36.2  PLT 278  --   --   --   --   --   --  230  APTT  --    < > 43*  --  99* 80* 77* 104*  LABPROT  --   --  18.2*  --   --   --   --   --   INR  --   --  1.5*  --   --   --   --   --   HEPARINUNFRC  --   --  >3.60*  --  3.18*  --   --   --   CREATININE 3.13*  --   --   --   --   --   --  2.61*  TROPONINI 1.44*  --   --    < > 7.68* 8.26* 5.09*  --    < > = values in this interval not displayed.    Estimated Creatinine Clearance: 25.3 mL/min (A) (by C-G formula based on SCr of 2.61 mg/dL (H)).   Medical History: Past Medical History:  Diagnosis Date  . Amputation of left lower extremity below knee ([place])   . Amputation of right lower extremity below knee ([place])   . CAD (coronary artery disease)    a. 2004 Cardiac Arrest/CABG x 3 (LIMA->LAD, VG->OM, VG->RCA);  b. 06/2015 lexiscan MV: no significant ischemia, EF 48%, low risk->Med Rx.  . Chronic combined systolic and diastolic CHF (congestive heart failure) ([place])    a. 12/2014 Echo: EF 45-50%;  b. 08/2015 Echo: EF 45-50%, ant, antsept HK, mildly dil LA, nl RV,  mild-mod TR, sev PAH (68mmHg); b. 08/2017 TEE: EF 40-45%, large PFO w/ L->R shunting.  . CKD (chronic kidney disease), stage IV ([place])   . CLL (chronic lymphocytic leukemia) ([place])   . Diabetes mellitus without complication ([place])   . Essential hypertension   . GERD (gastroesophageal reflux disease)   . Hiatal hernia   . History of cardiac arrest    a. 2004.  Marland Kitchen Hyperlipidemia   . Ischemic cardiomyopathy    a. s/p MDT ICD (originally had 4128 lead-->gen change and lead revision ~ 2012 @ [facility]); b. 12/2014 Echo: EF 45-50%;  c.08/2015 Echo: EF 45-50%; d. 08/2017 Echo: EF 40-45%.  Marland Kitchen PAD (peripheral artery disease) ([facility])    a. s/p bilat BKA  . Persistent atrial fibrillation    a. Dx 12/2014.  CHA2DS2VASc = 6--> warfarin;  b.  09/2015 s/p DCCV-->on amio; c. s/p DCCV 04/25/16; d. 07/2016 s/p RFCA/PVI in setting of recurrent afib despite amio; e. 05/2018 Recurrent afib.    Assessment: Patient is a 73yo female admitted with chest pain. Pharmacy consulted for Heparin dosing. Noted that patient was taking Apixaban 5mg  bid prior to admission, last dose was 3/9 at 07:00.  3/10 @ 0400 aPTT 99 seconds therapeutic. 3/10 @ 1012 aPTT 80 seconds therapeutic. 3/10 @ 1652 aPTT 77 seconds therapeutic.   Goal of Therapy:  Heparin level 0.3-0.7 units/ml when aPTT correlates with heparin level aPTT 66-102 sec Monitor platelets by anticoagulation protocol: Yes   Plan:  03/11 @ 0500 aPTT 104 seconds slightly supratherapeutic. Will decrease rate to 1000 units/hr and will recheck aPTT @ 1200, HL still pending, CBC stable will continue to monitor.  Tobie Lords, PharmD, BCPS Clinical Pharmacist 11/20/2018

## 2018-11-20 NOTE — Progress Notes (Signed)
[place] [facility] [place] NAME: Caroline Hernandez    MR#:  [id number]  DATE OF BIRTH:  April 25, 1946  SUBJECTIVE:  CHIEF COMPLAINT:   Chief Complaint  Patient presents with  . Chest Pain   - some dyspnea today, on 4L o2 which is acute - no chest pain, for cardiac cath today  REVIEW OF SYSTEMS:  Review of Systems  Constitutional: Positive for malaise/fatigue. Negative for chills and fever.  HENT: Negative for ear discharge, hearing loss and nosebleeds.   Eyes: Negative for blurred vision and double vision.  Respiratory: Positive for shortness of breath. Negative for cough and wheezing.   Cardiovascular: Positive for orthopnea. Negative for chest pain, palpitations and leg swelling.  Gastrointestinal: Negative for abdominal pain, constipation, diarrhea, nausea and vomiting.  Genitourinary: Negative for dysuria.  Neurological: Negative for dizziness, focal weakness, seizures, weakness and headaches.  Psychiatric/Behavioral: Negative for depression.    DRUG ALLERGIES:   Allergies  Allergen Reactions  . Piperacillin Other (See Comments)    Renal failure  . 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 See ID note**PER PT-CAUSED RENAL FAILURE**  . Zosyn [Piperacillin Sod-Tazobactam So] Other (See Comments)    Renal failure    VITALS:  Blood pressure 114/77, pulse 75, temperature 97.8 F (36.6 C), temperature source Oral, resp. rate 16, height 5\' 8"  (1.727 m), weight 103.9 kg, SpO2 94 %.  PHYSICAL EXAMINATION:  Physical Exam  GENERAL:  73 y.o.-year-old patient lying in the bed with no acute distress.  EYES: Pupils equal, round, reactive to light and accommodation. No scleral icterus. Extraocular muscles intact.  HEENT: Head atraumatic, normocephalic. Oropharynx and nasopharynx clear.  NECK:  Supple, no jugular venous distention. No thyroid enlargement, no tenderness.  LUNGS: Normal breath sounds  bilaterally, no wheezing, rales,rhonchi or crepitation. No use of accessory muscles of respiration.  Basilar crackles heard CARDIOVASCULAR: S1, S2 normal. No   rubs, or gallops. 2/6 systolic murmur present ABDOMEN: Soft, nontender, nondistended. Abdominal PD catheter present. Bowel sounds present. No organomegaly or mass.  EXTREMITIES: s/p bilateral BKA Right chest permacath present NEUROLOGIC: Cranial nerves II through XII are intact. Muscle strength 5/5 in all extremities. Sensation intact. Gait not checked.  PSYCHIATRIC: The patient is alert and oriented x 3.  SKIN: No obvious rash, lesion, or ulcer.    LABORATORY PANEL:   CBC Recent Labs  Lab 11/20/18 0511  WBC 43.8*  HGB 10.8*  HCT 36.2  PLT 230   ------------------------------------------------------------------------------------------------------------------  Chemistries  Recent Labs  Lab 11/18/18 2255 11/20/18 0511  NA  --  133*  K  --  4.7  CL  --  97*  CO2  --  26  GLUCOSE  --  144*  BUN  --  24*  CREATININE  --  2.61*  CALCIUM  --  8.3*  MG 2.0  --    ------------------------------------------------------------------------------------------------------------------  Cardiac Enzymes Recent Labs  Lab 11/19/18 1652  TROPONINI 5.09*   ------------------------------------------------------------------------------------------------------------------  RADIOLOGY:  Dg Chest 2 View  Result Date: 11/18/2018 CLINICAL DATA:  Chest pain. EXAM: CHEST - 2 VIEW COMPARISON:  October 29, 2018 FINDINGS: Stable right dialysis catheter. The heart, hila, mediastinum, and left AICD device are stable. No overt edema. Mild atelectasis in the bases. IMPRESSION: No active cardiopulmonary disease. Electronically Signed   By: Dorise Bullion III M.D   On: 11/18/2018 17:35   US Abdomen Limited Ruq  Result Date: 11/18/2018 CLINICAL DATA:  Right  upper quadrant pain EXAM: ULTRASOUND ABDOMEN LIMITED RIGHT UPPER QUADRANT COMPARISON:   None. FINDINGS: Gallbladder: 1or 2 small stones measuring up to 6 mm seen in the gallbladder without wall thickening, pericholecystic fluid, or Murphy's sign. Common bile duct: Diameter: 1.1 mm Liver: No focal lesion identified. Within normal limits in parenchymal echogenicity. Portal vein is patent on color Doppler imaging with normal direction of blood flow towards the liver. IMPRESSION: Cholelithiasis without wall thickening, pericholecystic fluid, or Murphy's sign. Electronically Signed   By: Dorise Bullion III M.D   On: 11/18/2018 19:06    EKG:   Orders placed or performed during the hospital encounter of 11/18/18  . EKG 12-Lead  . EKG 12-Lead  . ED EKG  . ED EKG    ASSESSMENT AND PLAN:   73 year old female with multiple past medical history significant for history of CAD status post CABG, CLL, diabetes, PAD status post bilateral BKA, chronic diastolic heart failure, anemia, end-stage renal disease on dialysis, atrial fibrillation status post ablation and pacer/ICD on Eliquis comes to hospital secondary to chest pain.  1. NSTEMI-known significant cardiac history -Appreciate cardiology consult.  Eliquis held, on heparin drip -Troponin elevated, for cardiac cath today -Last echocardiogram with EF of 35 to 40% -Continue cardiac medications. -currently on oxygen 4L which is acute, continue to wean as tolerated  2.  End-stage renal disease on dialysis-patient initiated on hemodialysis pretty recently. -Finished training for home PD dialysis.  consulted nephrology. -on hemodialysis here, last dialysis yesterday and HD again today after cardiac cath.  3.  Atrial fibrillation-status post prior ablations, continue rate control, on low-dose metoprolol -Was on Eliquis which is held, currently started on heparin drip.  4.  Leukocytosis--secondary to her CLL.  Monitor  5.  Hypertension-on Norvasc, Imdur, metoprolol  6.  Diabetes mellitus-on Lantus and sliding scale insulin  7.  DVT  prophylaxis-on heparin drip   Will need home health at discharge.  Daughter updated at bedside -Patient has bilateral prostheses for the lower extremities   All the records are reviewed and case discussed with Care Management/Social Workerr. Management plans discussed with the patient, family and they are in agreement.  CODE STATUS: Full Code  TOTAL TIME TAKING CARE OF THIS PATIENT: 36 minutes.   POSSIBLE D/C IN 1-2 DAYS, DEPENDING ON CLINICAL CONDITION.   Gladstone Lighter M.D on 11/20/2018 at 11:50 AM  Between 7am to 6pm - Pager - [phone]  After 6pm go to [email or web address] - password [email or web address] [facility]  Office  [phone]  CC: Primary care physician; Apolonio Schneiders, MD

## 2018-11-20 NOTE — Consult Note (Signed)
Name: Jennalyn Cawley MRN: [id number] DOB: [birth year]    ADMISSION DATE:  11/18/2018 CONSULTATION DATE:  11/20/2018  REFERRING MD :  Dr. Rockey Situ  CHIEF COMPLAINT:  Acute Respiratory Distress  BRIEF PATIENT DESCRIPTION:  73 year old female with a past medical history notable for end-stage renal disease on peritoneal dialysis, admitted with non-STEMI who underwent cardiac catheterization on 11/20/18.  Noted to have severe three-vessel disease, with severely stenosed anastomosis of vein graft to the OM1.  Successful PCI with drug-eluting stent was performed.  However while undergoing her cardiac cath, patient with acute respiratory distress and hypoxia requiring nonrebreather mask.  She was transferred to the ICU postprocedure for BiPAP and emergent hemodialysis for volume removal.  Postdialysis she has been weaned from the BiPAP with noted improvement in her respiratory status.  SIGNIFICANT EVENTS  11/18/18>> admission to [facility] MedSurg unit 11/20/18>> underwent cardiac catheterization with PCI drug-eluting stent; during the procedure she became hypoxic with acute respiratory distress requiring transfer to ICU for BiPAP and hemodialysis  STUDIES:  Cardiac cath 11/20/18>> 1st Mrg lesion is 95% stenosed.  A drug-eluting stent was successfully placed using a STENT RESOLUTE ONYX 2.5X12.  Post intervention, there is a 0% residual stenosis.   High-grade anastomotic stenosis SVG to OM1.  Successful PCI with DES at the anastomotic site SVG to OM1  CULTURES: MRSA PCR 11/18/18>> negative  ANTIBIOTICS:  HISTORY OF PRESENT ILLNESS:   Mrs. Amescua is a 73 year old female with a past medical history of end-stage renal disease on peritoneal dialysis, combined systolic and diastolic CHF, diabetes, bilateral BKA's, CAD, hypertension, hyperlipidemia, PAD, persistent atrial fibrillation, and GERD who presented to [facility] ED on 11/18/18 with complaints of shortness of breath and chest pain.  She ruled in for non-STEMI.  Her  chronic kidney disease has progressed to AKI requiring hemodialysis, nephrology has been following.  On 11/20/18 she underwent cardiac catheterization which revealed severe three-vessel disease, with severe stenosis of the anastomosis vein graft to the OM1.  Successful PCI with drug-eluting stent was performed.  However during her cardiac catheterization, she became hypoxic with acute respiratory distress requiring being placed on nonrebreather mask.  Post-cath she was transferred to the ICU for initiation of BiPAP and emergent hemodialysis for volume removal.  Post dialysis she was able to be weaned from the BiPAP with noted improvement in her respiratory status.  PCCM was consulted for further management.  PAST MEDICAL HISTORY :   has a past medical history of Amputation of left lower extremity below knee ([place]), Amputation of right lower extremity below knee ([place]), CAD (coronary artery disease), Chronic combined systolic and diastolic CHF (congestive heart failure) ([place]), CKD (chronic kidney disease), stage IV ([place]), CLL (chronic lymphocytic leukemia) ([place]), Diabetes mellitus without complication ([place]), Essential hypertension, GERD (gastroesophageal reflux disease), Hiatal hernia, History of cardiac arrest, Hyperlipidemia, Ischemic cardiomyopathy, PAD (peripheral artery disease) ([place]), and Persistent atrial fibrillation.  has a past surgical history that includes Abdominal hysterectomy; Amputation lower extremity bilaterally (Bilateral); Coronary artery bypass graft; Cardiac catheterization; Cardiac catheterization (N/A, 10/07/2015); Cardiac catheterization (N/A, 04/25/2016); Implantable cardioverter defibrillator implant (2005); Cardiac catheterization (N/A, 07/18/2016); TEE without cardioversion (N/A, 07/18/2016); Cardioversion (N/A, 10/09/2016); TEE without cardioversion (N/A, 10/09/2016); DIALYSIS/PERMA CATHETER INSERTION (N/A, 10/22/2018); and CAPD insertion (N/A, 10/25/2018). Prior to Admission medications    Medication Sig Start Date End Date Taking? Authorizing Provider  amitriptyline (ELAVIL) 25 MG tablet Take 25 mg by mouth at bedtime.    Yes [provider]  amLODipine (NORVASC) 10 MG tablet TAKE 1 TABLET(10 MG) BY  MOUTH DAILY 08/19/18  Yes Gollan, Kathlene November, MD  apixaban (ELIQUIS) 5 MG TABS tablet Take 1 tablet (5 mg total) by mouth 2 (two) times daily. 11/01/18  Yes Deboraha Sprang, MD  atorvastatin (LIPITOR) 40 MG tablet Take 40 mg by mouth at bedtime.    Yes [provider]  Cholecalciferol (HM VITAMIN D3) 100 MCG (4000 UT) CAPS Take 4,000 Units by mouth daily.    Yes [provider]  gabapentin (NEURONTIN) 100 MG capsule Take 100 mg by mouth 2 (two) times daily.   Yes [provider]  insulin aspart (NOVOLOG) 100 UNIT/ML injection Inject 5-10 Units into the skin 3 (three) times daily before meals. 10 units into the skin before breakfast then 5 units before lunch then 10 units before supper (evening meal)   Yes [provider]  insulin glargine (LANTUS) 100 UNIT/ML injection Inject 0.15 mLs (15 Units total) into the skin 2 (two) times daily. 10/27/18  Yes Demetrios Loll, MD  iron polysaccharides (NIFEREX) 150 MG capsule Take 150 mg by mouth daily.   Yes [provider]  isosorbide mononitrate (IMDUR) 30 MG 24 hr tablet TAKE 2 TABLETS(60 MG) BY MOUTH TWICE DAILY 11/01/18  Yes Theora Gianotti, NP  Multiple Vitamins-Minerals (VITEYES AREDS FORMULA/LUTEIN) CAPS Take 1 capsule by mouth 2 (two) times daily.   Yes [provider]  nitroGLYCERIN (NITROSTAT) 0.4 MG SL tablet Place 1 tablet (0.4 mg total) under the tongue every 5 (five) minutes as needed for chest pain. 06/14/15  Yes Mody, Ulice Bold, MD  pantoprazole (PROTONIX) 40 MG tablet Take 40 mg by mouth at bedtime. 06/19/16  Yes [provider]  sertraline (ZOLOFT) 100 MG tablet Take 100 mg by mouth at bedtime.  06/02/15  Yes [provider]  sulfamethoxazole-trimethoprim  (BACTRIM DS,SEPTRA DS) 800-160 MG tablet Take 1 tablet by mouth 2 (two) times daily. 11/11/18  Yes [provider]  torsemide (DEMADEX) 20 MG tablet Take 2 tablets (40 mg total) by mouth 2 (two) times daily. 11/12/18  Yes Theora Gianotti, NP  vitamin C (ASCORBIC ACID) 500 MG tablet Take 500 mg by mouth daily.   Yes [provider]   Allergies  Allergen Reactions  . Piperacillin Other (See Comments)    Renal failure  . 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 See ID note**PER PT-CAUSED RENAL FAILURE**  . Zosyn [Piperacillin Sod-Tazobactam So] Other (See Comments)    Renal failure    FAMILY HISTORY:  family history includes Atrial fibrillation in her mother; CAD in her mother; Diabetes in her father and mother; Lung cancer in her father. SOCIAL HISTORY:  reports that she has never smoked. She has never used smokeless tobacco. She reports that she does not drink alcohol or use drugs.  REVIEW OF SYSTEMS: Positives in BOLD: Pt denies all complaints  Constitutional: Negative for fever, chills, weight loss, malaise/fatigue and diaphoresis.  HENT: Negative for hearing loss, ear pain, nosebleeds, congestion, sore throat, neck pain, tinnitus and ear discharge.   Eyes: Negative for blurred vision, double vision, photophobia, pain, discharge and redness.  Respiratory: Negative for cough, hemoptysis, sputum production, shortness of breath, wheezing and stridor.   Cardiovascular: Negative for chest pain, palpitations, orthopnea, claudication, leg swelling and PND.  Gastrointestinal: Negative for heartburn, nausea, vomiting, abdominal pain, diarrhea, constipation, blood in stool and melena.  Genitourinary: Negative for dysuria, urgency, frequency, hematuria and flank pain.  Musculoskeletal: Negative for myalgias, back pain, joint pain and falls.  Skin: Negative for itching and rash.  Neurological: Negative for dizziness, tingling, tremors,  sensory change, speech change, focal weakness, seizures, loss of consciousness, weakness and headaches.  Endo/Heme/Allergies: Negative for environmental allergies and polydipsia. Does not bruise/bleed easily.  SUBJECTIVE:  Pt reports she is no longer short of breath Denies chest pain, cough, fever/chills Tolerating BiPAP Wants to come off BiPAP and wants to eat Currently receiving HD  VITAL SIGNS: Temp:  [96.4 F (35.8 C)-98.4 F (36.9 C)] 97.5 F (36.4 C) (03/11 2200) Pulse Rate:  [60-76] 68 (03/11 2202) Resp:  [14-20] 18 (03/11 2200) BP: (109-133)/(61-83) 118/67 (03/11 2202) SpO2:  [82 %-100 %] 97 % (03/11 2200) FiO2 (%):  [40 %] 40 % (03/11 2115) Weight:  [99.9 kg-103.9 kg] 99.9 kg (03/11 2115)  PHYSICAL EXAMINATION: General:  Acutely ill appearing female, laying in bed, on bipap, in NAD Neuro:  Sleeping, arouses to voice, pleasantly confused (oriented to person and place), follows commands, no focal deficits, speech clear HEENT:  Normocephalic, atraumatic, neck supple, no JVD Cardiovascular:  Irregularly irregular rhythm, rate controlled, no M/R/G, 2+ radial pusles Lungs:  Clear to auscultation bilaterally, even, nonlabored, BiPAP assisted Abdomen:  Obese, soft, nontender, nondistended, no guarding or rebound tenderness, BS+ x4 Musculoskeletal:  Normal bulk and tone, bilateral BKA, no edema  Skin:  Warm/dry.  No obvious rashes, lesions, or ulcerations  Recent Labs  Lab 11/18/18 1722 11/20/18 0511  NA 135 133*  K 4.7 4.7  CL 96* 97*  CO2 26 26  BUN 29* 24*  CREATININE 3.13* 2.61*  GLUCOSE 126* 144*   Recent Labs  Lab 11/18/18 1722 11/20/18 0511  HGB 10.9* 10.8*  HCT 35.6* 36.2  WBC 46.1* 43.8*  PLT 278 230   No results found.  ASSESSMENT / PLAN:  Acute Hypoxic Respiratory failure in setting of volume overload -Supplemental O2 as needed to maintain O2 sats greater than 92% -BiPAP, wean as tolerated -Follow intermittent CXR and ABG -Volume removal with HD   NSTEMI Ischemic cardiomyopathy with EF of 35 to 40% Atrial Fibrillation Hx: Chronic combined systolic and diastolic CHF, CAD, PAD -Cardiology following appreciate input -s/p cardiac cath on 3/11 with successful PCI and drug-eluting stent placement -Continue Plavix as per cardiology -Cardiac monitoring -Maintain MAP greater than 65 -Atrial fibrillation rate controlled -Apixaban currently on hold per cardiology, plan to resume on 3/12  ESRD, currently requiring HD (previously on PD) -Monitor I&O's / urinary output -Follow BMP -Ensure adequate renal perfusion -Avoid nephrotoxic agents as able -Replace electrolytes as indicated -Nephrology following, appreciate input -HD as per Nephrology       Disposition: ICU Goals of care: Full code VTE prophylaxis: Eliquis to resume on 3/12 Updates: Updated patient at bedside 3/11   Darel Hong, [facility] Pager: [phone] Cell: [phone]  11/20/2018, 11:01 PM

## 2018-11-21 ENCOUNTER — Ambulatory Visit: Payer: Medicare HMO | Admitting: Physician Assistant

## 2018-11-21 ENCOUNTER — Inpatient Hospital Stay: Payer: Medicare HMO

## 2018-11-21 ENCOUNTER — Telehealth: Payer: Self-pay | Admitting: Cardiovascular Disease

## 2018-11-21 ENCOUNTER — Other Ambulatory Visit: Payer: Self-pay

## 2018-11-21 ENCOUNTER — Telehealth: Payer: Self-pay | Admitting: *Deleted

## 2018-11-21 ENCOUNTER — Encounter (INDEPENDENT_AMBULATORY_CARE_PROVIDER_SITE_OTHER): Payer: Self-pay | Admitting: Nurse Practitioner

## 2018-11-21 DIAGNOSIS — I25118 Atherosclerotic heart disease of native coronary artery with other forms of angina pectoris: Secondary | ICD-10-CM

## 2018-11-21 LAB — CBC
HCT: 35 % — ABNORMAL LOW (ref 36.0–46.0)
Hemoglobin: 10.4 g/dL — ABNORMAL LOW (ref 12.0–15.0)
MCH: 26.3 pg (ref 26.0–34.0)
MCHC: 29.7 g/dL — ABNORMAL LOW (ref 30.0–36.0)
MCV: 88.4 fL (ref 80.0–100.0)
Platelets: 231 10*3/uL (ref 150–400)
RBC: 3.96 MIL/uL (ref 3.87–5.11)
RDW: 20.2 % — ABNORMAL HIGH (ref 11.5–15.5)
WBC: 43.1 10*3/uL — ABNORMAL HIGH (ref 4.0–10.5)
nRBC: 0.1 % (ref 0.0–0.2)

## 2018-11-21 LAB — BASIC METABOLIC PANEL
Anion gap: 14 (ref 5–15)
BUN: 43 mg/dL — ABNORMAL HIGH (ref 8–23)
CO2: 23 mmol/L (ref 22–32)
Calcium: 8.2 mg/dL — ABNORMAL LOW (ref 8.9–10.3)
Chloride: 96 mmol/L — ABNORMAL LOW (ref 98–111)
Creatinine, Ser: 4.04 mg/dL — ABNORMAL HIGH (ref 0.44–1.00)
GFR calc Af Amer: 12 mL/min — ABNORMAL LOW (ref >60)
GFR calc non Af Amer: 11 mL/min — ABNORMAL LOW (ref >60)
GLUCOSE: 106 mg/dL — AB (ref 70–99)
Potassium: 5 mmol/L (ref 3.5–5.1)
Sodium: 133 mmol/L — ABNORMAL LOW (ref 135–145)

## 2018-11-21 LAB — GLUCOSE, CAPILLARY
Glucose-Capillary: 102 mg/dL — ABNORMAL HIGH (ref 70–99)
Glucose-Capillary: 119 mg/dL — ABNORMAL HIGH (ref 70–99)
Glucose-Capillary: 175 mg/dL — ABNORMAL HIGH (ref 70–99)
Glucose-Capillary: 123 mg/dL — ABNORMAL HIGH (ref 70–99)

## 2018-11-21 LAB — HEPARIN LEVEL (UNFRACTIONATED): Heparin Unfractionated: 1.71 IU/mL — ABNORMAL HIGH (ref 0.30–0.70)

## 2018-11-21 MED ORDER — PHENYLEPHRINE HCL-NACL 10-0.9 MG/250ML-% IV SOLN: 0.0000 ug/min | INTRAVENOUS | Status: DC

## 2018-11-21 MED ORDER — APIXABAN 5 MG PO TABS
5.0000 mg | ORAL_TABLET | Freq: Two times a day (BID) | ORAL | Status: DC
  Administered 2018-11-21 – 2018-11-23 (×4): 5 mg via ORAL
  Filled 2018-11-21 (×4): qty 1

## 2018-11-21 NOTE — Telephone Encounter (Signed)
Cecille Rubin from [facility] calling  States that she had a hard time getting in contact with patient and has now found out patient is in hospital Needed to make office aware that last discharge visit was not done

## 2018-11-21 NOTE — Progress Notes (Signed)
Post HD Tx  2000 mL net fluid removal post tx. Stable, calm and cooperative post tx.    11/21/18 1831  Neurological  Level of Consciousness Alert  Orientation Level Oriented X4  Respiratory  Respiratory Pattern Irregular;Shallow  Bilateral Breath Sounds Diminished;Fine crackles  Cardiac  Pulse Irregular  Heart Sounds S1, S2  Cardiac Rhythm Atrial fibrillation  Antiarrhythmic device Yes  Antiarrhythmic device  Antiarrhythmic device ICD;Permanent Pacemaker  ICD Manufacturer Medtronic  Vascular  R Radial Pulse +2  L Radial Pulse +2  Integumentary  Integumentary (WDL) X  Skin Color Appropriate for ethnicity  Skin Condition Dry  Skin Integrity Amputation;Catheter entry/exit site;Ecchymosis  Ecchymosis Location Abdomen;Arm  Ecchymosis Location Orientation Bilateral  Musculoskeletal  Musculoskeletal (WDL) X  Generalized Weakness Yes  Gastrointestinal  Bowel Sounds Assessment Active  GU Assessment  Genitourinary (WDL) X  Genitourinary Symptoms Oliguria;External catheter  External Urinary Catheter  Placement Date/Time: 11/18/18 2355   Person Inserting Catheter: Diandra RN  External Urinary Catheter Type: Female  Collection Container Dedicated Suction Canister  Psychosocial  Psychosocial (WDL) WDL

## 2018-11-21 NOTE — Progress Notes (Signed)
Patient is comfortable on low-flow nasal cannula oxygen.  Her chest x-ray looks excellent.  I have placed order for transfer to telemetry bed.  After transfer, PCCM will sign off. Please call if we can be of further assistance   Merton Border, MD PCCM service Mobile [phone] Pager [phone] 11/21/2018 2:47 PM

## 2018-11-21 NOTE — Progress Notes (Signed)
Pre HD Assessment   11/21/18 1555  Neurological  Level of Consciousness Alert  Orientation Level Oriented X4  Respiratory  Respiratory Pattern Irregular;Shallow  Bilateral Breath Sounds Diminished;Fine crackles  Cardiac  Pulse Irregular  Heart Sounds S1, S2  Cardiac Rhythm Atrial fibrillation  Antiarrhythmic device Yes  Antiarrhythmic device  Antiarrhythmic device ICD;Permanent Pacemaker  ICD Manufacturer Medtronic  Vascular  R Radial Pulse +2  L Radial Pulse +2  Integumentary  Integumentary (WDL) X  Skin Color Appropriate for ethnicity  Skin Condition Dry  Skin Integrity Amputation;Catheter entry/exit site;Ecchymosis  Ecchymosis Location Abdomen;Arm  Ecchymosis Location Orientation Bilateral  Additional Integumentary Comments  (HD pt -- CVC and PD cath)  Musculoskeletal  Musculoskeletal (WDL) X  Generalized Weakness Yes  Gastrointestinal  Bowel Sounds Assessment Active  GU Assessment  Genitourinary (WDL) X  Genitourinary Symptoms Oliguria;External catheter  External Urinary Catheter  Placement Date/Time: 11/18/18 2355   Person Inserting Catheter: Diandra RN  External Urinary Catheter Type: Female  Collection Container Dedicated Suction Canister  Psychosocial  Psychosocial (WDL) WDL

## 2018-11-21 NOTE — Progress Notes (Signed)
[facility]  ROUNDING NOTE   Subjective:   Patient known to our practice from outpatient dialysis as well as previous admissions.  This time she presents for chest discomfort and right shoulder pain.  Found to have elevated troponins.  Total of 4000 cc of fluid removed with hemodialysis last 2 days. Off biPAP, breathing better but still requiring 4L O2.     Objective:  Vital signs in last 24 hours:  Temp:  [96.4 F (35.8 C)-98.4 F (36.9 C)] 98 F (36.7 C) (03/12 0800) Pulse Rate:  [60-79] 74 (03/12 1110) Resp:  [14-21] 21 (03/12 1100) BP: (109-140)/(61-86) 135/80 (03/12 1110) SpO2:  [82 %-100 %] 91 % (03/12 1100) FiO2 (%):  [40 %] 40 % (03/11 2115) Weight:  [99.9 kg-103.9 kg] 99.9 kg (03/11 2115)  Weight change: -1.33 kg Filed Weights   11/20/18 1730 11/20/18 1800 11/20/18 2115  Weight: 103.8 kg 103.8 kg 99.9 kg    Intake/Output: I/O last 3 completed shifts: In: 285.6 [I.V.:285.6] Out: 1500 [Other:1500]   Intake/Output this shift:  Total I/O In: 603 [P.O.:600; I.V.:3] Out: -   Physical Exam: General: No acute distress  Head: Normocephalic, atraumatic. Moist oral mucosal membranes  Eyes: Anicteric  Neck: Supple,  Lungs:   Mild basilar crackles, normal effort, oxygen by nasal cannula  Heart:  Irregular rhythm 2/6 murmur,  Abdomen:  Soft, PD catheter in place  Extremities: B/L BKA  Neurologic: Awake, alert, following commands  Skin: No lesions  Access:  RIJ permcath 2/12 Dr. Delana Meyer, PD catheter Dr. Delana Meyer 8/09    Basic Metabolic Panel: Recent Labs  Lab 11/18/18 1722 11/18/18 2255 11/20/18 0511 11/21/18 0554  NA 135  --  133* 133*  K 4.7  --  4.7 5.0  CL 96*  --  97* 96*  CO2 26  --  26 23  GLUCOSE 126*  --  144* 106*  BUN 29*  --  24* 43*  CREATININE 3.13*  --  2.61* 4.04*  CALCIUM 9.4  --  8.3* 8.2*  MG  --  2.0  --   --     Liver Function Tests: No results for input(s): AST, ALT, ALKPHOS, BILITOT, PROT, ALBUMIN in the last 168  hours. No results for input(s): LIPASE, AMYLASE in the last 168 hours. No results for input(s): AMMONIA in the last 168 hours.  CBC: Recent Labs  Lab 11/18/18 1722 11/20/18 0511 11/21/18 0554  WBC 46.1* 43.8* 43.1*  HGB 10.9* 10.8* 10.4*  HCT 35.6* 36.2 35.0*  MCV 85.8 87.4 88.4  PLT 278 230 231    Cardiac Enzymes: Recent Labs  Lab 11/18/18 1722 11/18/18 2255 11/19/18 0407 11/19/18 1012 11/19/18 1652  TROPONINI 1.44* 3.84* 7.68* 8.26* 5.09*    BNP: Invalid input(s): POCBNP  CBG: Recent Labs  Lab 11/20/18 1145 11/20/18 1724 11/20/18 2122 11/21/18 0013 11/21/18 0741  GLUCAP 106* 89 72 119* 102*    Microbiology: Results for orders placed or performed during the hospital encounter of 11/18/18  MRSA PCR Screening     Status: None   Collection Time: 11/18/18 10:32 PM  Result Value Ref Range Status   MRSA by PCR NEGATIVE NEGATIVE Final    Comment:        The GeneXpert MRSA Assay (FDA approved for NASAL specimens only), is one component of a comprehensive MRSA colonization surveillance program. It is not intended to diagnose MRSA infection nor to guide or monitor treatment for MRSA infections. Performed at [facility], [place],  [place], [place] [address]     Coagulation Studies: Recent Labs    11/18/18 1927  LABPROT 18.2*  INR 1.5*    Urinalysis: Recent Labs    11/19/18 0344  COLORURINE YELLOW*  LABSPEC 1.015  PHURINE 5.0  GLUCOSEU NEGATIVE  HGBUR NEGATIVE  BILIRUBINUR NEGATIVE  KETONESUR NEGATIVE  PROTEINUR 100*  NITRITE NEGATIVE  LEUKOCYTESUR SMALL*      Imaging: Dg Chest Port 1 View  Result Date: 11/21/2018 CLINICAL DATA:  Respiratory failure. EXAM: PORTABLE CHEST 1 VIEW COMPARISON:  11/18/2018. FINDINGS: The heart is enlarged. AICD device unchanged. Prior CABG. Mild vascular congestion. Stable dialysis catheter. No consolidation or frank edema. IMPRESSION: Stable exam.  No acute findings. Electronically Signed    By: Staci Righter M.D.   On: 11/21/2018 08:19     Medications:   . sodium chloride 5 mL/hr at 11/20/18 1554  . sodium chloride     . amitriptyline  25 mg Oral QHS  . amLODipine  10 mg Oral Daily  . aspirin  81 mg Oral Daily  . aspirin EC  81 mg Oral Daily  . atorvastatin  40 mg Oral q1800  . Chlorhexidine Gluconate Cloth  6 each Topical Q0600  . clopidogrel  75 mg Oral Q breakfast  . gabapentin  100 mg Oral BID  . insulin aspart  0-5 Units Subcutaneous QHS  . insulin aspart  0-9 Units Subcutaneous TID WC  . insulin glargine  15 Units Subcutaneous BID  . iron polysaccharides  150 mg Oral Daily  . isosorbide mononitrate  30 mg Oral Daily  . metoprolol tartrate  12.5 mg Oral BID  . pantoprazole  40 mg Oral QHS  . sertraline  100 mg Oral QHS  . sodium chloride flush  3 mL Intravenous Once  . sodium chloride flush  3 mL Intravenous Q12H  . sodium chloride flush  3 mL Intravenous Q12H   sodium chloride, sodium chloride, acetaminophen, acetaminophen, ALPRAZolam, guaiFENesin-dextromethorphan, hydrOXYzine, nitroGLYCERIN, ondansetron (ZOFRAN) IV, ondansetron (ZOFRAN) IV, sodium chloride flush, sodium chloride flush, zolpidem  Assessment/ Plan:  [name] is a 73 y.o. [place] female with atrial fibrillation, hypertension, diabetes mellitus type II insulin dependent, diabetic neuropathy, congestive heart failure, coronary artery disease status post CABG, GERD, CLL, bilateral below the knee amputations admitted with cough, shortness of breath, UTI and initiated on hemodialysis this admission.   1.  End Stage Renal Disease with volume overload  Ultrafiltration for volume optimization.  Patient has clinically improved today Extra hemodialysis today for more gentle volume removal UF goal ~ 2 L as tolerated  2.  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 Diabetes mellitus type II with chronic kidney disease: insulin dependent. history of poor control.   4.  Cardiac history: Atrial fibrillation:  -Amiodarone discontinued due to bradycardia Chronic systolic CHF with LVEF 35 to 40%, severe hypokinesis of septal, anteroseptal and anterior left ventricular segments, mildly dilated left atrium, moderate aortic sclerosis, mildly elevated right ventricular systolic pressure of 30.0 mm.  Last echocardiogram October 11, 2018 -Non-STEMI November 18, 2018; cath November 19, 2017- DES placed SVG to OM1     LOS: 3 Jakobie Henslee 3/12/202011:26 AM

## 2018-11-21 NOTE — Progress Notes (Signed)
Post HD Tx   11/21/18 1830  Vital Signs  Resp 17  BP 115/69  Dialysis Weight  Weight 97.6 kg  Type of Weight Post-Dialysis  Post-Hemodialysis Assessment  Rinseback Volume (mL) 250 mL  KECN 40.2 V  Dialyzer Clearance Lightly streaked  Duration of HD Treatment -hour(s) 2.5 hour(s)  Hemodialysis Intake (mL) 500 mL  UF Total -Machine (mL) 2500 mL  Net UF (mL) 2000 mL  Tolerated HD Treatment Yes  Post-Hemodialysis Comments pt stable

## 2018-11-21 NOTE — Plan of Care (Signed)
Care plan reviewed with pt. Stated understanding.

## 2018-11-21 NOTE — Progress Notes (Signed)
[facility] [place] NAME: Caroline Hernandez    MR#:  [id number]  DATE OF BIRTH:  07-Aug-1946  SUBJECTIVE:  CHIEF COMPLAINT:   Chief Complaint  Patient presents with  . Chest Pain   -Feels some better today.  Status post obtuse marginal graft stenting done for 99% blockage yesterday. -Hypoxic after the procedure.  On 4 L acute oxygen. -For dialysis again today  REVIEW OF SYSTEMS:  Review of Systems  Constitutional: Positive for malaise/fatigue. Negative for chills and fever.  HENT: Negative for ear discharge, hearing loss and nosebleeds.   Eyes: Negative for blurred vision and double vision.  Respiratory: Positive for shortness of breath. Negative for cough and wheezing.   Cardiovascular: Positive for orthopnea. Negative for chest pain, palpitations and leg swelling.  Gastrointestinal: Negative for abdominal pain, constipation, diarrhea, nausea and vomiting.  Genitourinary: Negative for dysuria.  Neurological: Negative for dizziness, focal weakness, seizures, weakness and headaches.  Psychiatric/Behavioral: Negative for depression.    DRUG ALLERGIES:   Allergies  Allergen Reactions  . Piperacillin Other (See Comments)    Renal failure  . 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 See ID note**PER PT-CAUSED RENAL FAILURE**  . Zosyn [Piperacillin Sod-Tazobactam So] Other (See Comments)    Renal failure    VITALS:  Blood pressure 135/80, pulse 74, temperature 98 F (36.7 C), temperature source Axillary, resp. rate (!) 21, height 5\' 8"  (1.727 m), weight 99.9 kg, SpO2 91 %.  PHYSICAL EXAMINATION:  Physical Exam  GENERAL:  73 y.o.-year-old patient lying in the bed with no acute distress.  EYES: Pupils equal, round, reactive to light and accommodation. No scleral icterus. Extraocular muscles intact.  HEENT: Head atraumatic, normocephalic. Oropharynx and nasopharynx clear.  NECK:  Supple, no  jugular venous distention. No thyroid enlargement, no tenderness.  LUNGS: Normal breath sounds bilaterally, no wheezing, rales,rhonchi or crepitation. No use of accessory muscles of respiration.  Basilar crackles heard CARDIOVASCULAR: S1, S2 normal. No   rubs, or gallops. 2/6 systolic murmur present ABDOMEN: Soft, nontender, nondistended. Abdominal PD catheter present. Bowel sounds present. No organomegaly or mass.  EXTREMITIES: s/p bilateral BKA Right chest permacath present NEUROLOGIC: Cranial nerves II through XII are intact. Muscle strength 5/5 in all extremities. Sensation intact. Gait not checked.  PSYCHIATRIC: The patient is alert and oriented x 3.  SKIN: No obvious rash, lesion, or ulcer.    LABORATORY PANEL:   CBC Recent Labs  Lab 11/21/18 0554  WBC 43.1*  HGB 10.4*  HCT 35.0*  PLT 231   ------------------------------------------------------------------------------------------------------------------  Chemistries  Recent Labs  Lab 11/18/18 2255  11/21/18 0554  NA  --    < > 133*  K  --    < > 5.0  CL  --    < > 96*  CO2  --    < > 23  GLUCOSE  --    < > 106*  BUN  --    < > 43*  CREATININE  --    < > 4.04*  CALCIUM  --    < > 8.2*  MG 2.0  --   --    < > = values in this interval not displayed.   ------------------------------------------------------------------------------------------------------------------  Cardiac Enzymes Recent Labs  Lab 11/19/18 1652  TROPONINI 5.09*   ------------------------------------------------------------------------------------------------------------------  RADIOLOGY:  Dg Chest Port 1 View  Result Date: 11/21/2018 CLINICAL DATA:  Respiratory failure. EXAM: PORTABLE CHEST 1 VIEW COMPARISON:  11/18/2018. FINDINGS: The heart is enlarged. AICD device unchanged. Prior CABG. Mild vascular congestion. Stable dialysis catheter. No consolidation or frank edema. IMPRESSION: Stable exam.  No acute findings. Electronically Signed   By:  Staci Righter M.D.   On: 11/21/2018 08:19    EKG:   Orders placed or performed during the hospital encounter of 11/18/18  . EKG 12-Lead  . EKG 12-Lead  . ED EKG  . ED EKG  . EKG 12-Lead  . EKG 12-Lead    ASSESSMENT AND PLAN:   73 year old female with multiple past medical history significant for history of CAD status post CABG, CLL, diabetes, PAD status post bilateral BKA, chronic diastolic heart failure, anemia, end-stage renal disease on dialysis, atrial fibrillation status post ablation and pacer/ICD on Eliquis comes to hospital secondary to chest pain.  1. NSTEMI-known significant cardiac history -Appreciate cardiology consult.  on heparin drip -Troponin elevated, status post cardiac cath revealing patent grafts but severe disease of the vein graft to obtuse marginal and status post PCI -Started on aspirin and Plavix.  Continue statin, beta-blocker. -Last echocardiogram with EF of 35 to 40% -Continue cardiac medications. -currently on oxygen 4L which is acute, continue to wean as tolerated -Cardiac rehab after discharge  2.  End-stage renal disease on dialysis-patient initiated on hemodialysis pretty recently. -Finished training for home PD dialysis.  consulted nephrology. -on hemodialysis here, receiving hemodialysis for the last 2 days, ultrafiltration again today due to fluid overload and hypoxia.Marland Kitchen  3.  Atrial fibrillation-status post prior ablations, continue rate control, on low-dose metoprolol -Currently on heparin drip, will need to be resumed on Eliquis likely today..  4.  Leukocytosis--secondary to her CLL.  Monitor -Continue outpatient follow-up  5.  Hypertension-on Norvasc, Imdur, metoprolol  6.  Diabetes mellitus-on Lantus and sliding scale insulin  7.  DVT prophylaxis-on heparin drip-change to Eliquis today   Will need home health at discharge.    -Patient has bilateral prostheses for the lower extremities Discharge in the next 1 to 2 days   All the  records are reviewed and case discussed with Care Management/Social Workerr. Management plans discussed with the patient, family and they are in agreement.  CODE STATUS: Full Code  TOTAL TIME TAKING CARE OF THIS PATIENT: 37 minutes.   POSSIBLE D/C IN 1-2 DAYS, DEPENDING ON CLINICAL CONDITION.   Gladstone Lighter M.D on 11/21/2018 at 12:57 PM  Between 7am to 6pm - Pager - [phone]  After 6pm go to [email or web address] - password [email or web address] [facility]  Office  [phone]  CC: Primary care physician; Apolonio Schneiders, MD

## 2018-11-21 NOTE — Progress Notes (Signed)
Pre HD Tx   11/21/18 1545  Hand-Off documentation  Report given to (Full Name) Trellis Paganini RN  Report received from (Full Name) Sarah RN ICU   Vital Signs  Temp 98.4 F (36.9 C)  Temp Source Oral  Pulse Rate 77  Pulse Rate Source Monitor  Resp (!) 21  BP 119/69  BP Location Left Arm  BP Method Automatic  Patient Position (if appropriate) Lying  Oxygen Therapy  SpO2 91 %  O2 Device Room Air  Pain Assessment  Pain Scale 0-10  Pain Score 0  Dialysis Weight  Weight 99.9 kg  Type of Weight Pre-Dialysis  Time-Out for Hemodialysis  What Procedure? Hemodialysis   Pt Identifiers(min of two) First/Last Name;MRN/Account#  Correct Site? Yes  Correct Side? Yes  Correct Procedure? Yes  Consents Verified? Yes  Rad Studies Available? N/A  Safety Precautions Reviewed? Yes  Engineer, civil (consulting) Number 1  Station Number 1  UF/Alarm Test Passed  Conductivity: Meter 14  Conductivity: Machine  14  pH 7.4  Reverse Osmosis main  Normal Saline Lot Number [id number]  Dialyzer Lot Number [id number]  Disposable Set Lot Number [id number]  Machine Temperature 98.6 F (37 C)  Musician and Audible Yes  Blood Lines Intact and Secured Yes  Pre Treatment Patient Checks  Vascular access used during treatment Catheter  Hepatitis B Surface Antigen Results Negative  Date Hepatitis B Surface Antigen Drawn 11/12/18  Hepatitis B Surface Antibody  (<10)  Date Hepatitis B Surface Antibody Drawn 11/06/18  Hemodialysis Consent Verified Yes  Hemodialysis Standing Orders Initiated Yes  ECG (Telemetry) Monitor On Yes  Prime Ordered Normal Saline  Length of  DialysisTreatment -hour(s) 2.5 Hour(s)  Dialyzer Elisio 17H NR  Dialysate 2K, 2.5 Ca  Dialysate Flow Ordered 600  Blood Flow Rate Ordered 300 mL/min  Ultrafiltration Goal 2 Liters  Dialysis Blood Pressure Support Ordered Normal Saline  Education / Care Plan  Dialysis Education Provided Yes  Documented Education in Care Plan Yes   Hemodialysis Catheter  Placement Date: 10/21/18    Site Condition No complications  Blue Lumen Status Capped (Central line)  Red Lumen Status Capped (Central line)  Purple Lumen Status N/A  Dressing Type Biopatch  Dressing Status Clean;Intact;Dry  Drainage Description None

## 2018-11-21 NOTE — Progress Notes (Signed)
SUBJECTIVE:  Patient ID: Caroline Hernandez, female    DOB: [birth year], 73 y.o.   MRN: [id number] Chief Complaint  Patient presents with  . Follow-up    1 week [facility] f/u incision check    HPI  Caroline Hernandez is a 73 y.o. female presents today for wound evaluation following a peritoneal dialysis catheter insertion.  Today the wounds are well approximated with some scabbing.  No evidence of infection or drainage.  Patient denies any fever, chills, nausea, vomiting or diarrhea.  She denies any chest pain or shortness of breath.  Patient denies any TIA-like symptoms.  Patient denies any pain from incisions  Past Medical History:  Diagnosis Date  . Amputation of left lower extremity below knee ([place])   . Amputation of right lower extremity below knee ([place])   . CAD (coronary artery disease)    a. 2004 Cardiac Arrest/CABG x 3 (LIMA->LAD, VG->OM, VG->RCA);  b. 06/2015 lexiscan MV: no significant ischemia, EF 48%, low risk->Med Rx.  . Chronic combined systolic and diastolic CHF (congestive heart failure) ([place])    a. 12/2014 Echo: EF 45-50%;  b. 08/2015 Echo: EF 45-50%, ant, antsept HK, mildly dil LA, nl RV, mild-mod TR, sev PAH (65mmHg); b. 08/2017 TEE: EF 40-45%, large PFO w/ L->R shunting.  . CKD (chronic kidney disease), stage IV ([place])   . CLL (chronic lymphocytic leukemia) ([place])   . Diabetes mellitus without complication ([place])   . Essential hypertension   . GERD (gastroesophageal reflux disease)   . Hiatal hernia   . History of cardiac arrest    a. 2004.  Marland Kitchen Hyperlipidemia   . Ischemic cardiomyopathy    a. s/p MDT ICD (originally had 1937 lead-->gen change and lead revision ~ 2012 @ [facility]); b. 12/2014 Echo: EF 45-50%;  c.08/2015 Echo: EF 45-50%; d. 08/2017 Echo: EF 40-45%.  Marland Kitchen PAD (peripheral artery disease) (HCC)    a. s/p bilat BKA  . Persistent atrial fibrillation    a. Dx 12/2014.  CHA2DS2VASc = 6--> warfarin;  b. 09/2015 s/p DCCV-->on amio; c. s/p DCCV 04/25/16; d. 07/2016 s/p RFCA/PVI in setting  of recurrent afib despite amio; e. 05/2018 Recurrent afib.    Past Surgical History:  Procedure Laterality Date  . ABDOMINAL HYSTERECTOMY    . Amputation lower extremity bilaterally Bilateral   . CAPD INSERTION N/A 10/25/2018   Procedure: LAPAROSCOPIC INSERTION CONTINUOUS AMBULATORY PERITONEAL DIALYSIS  (CAPD) CATHETER;  Surgeon: Katha Cabal, MD;  Location: [facility] ORS;  Service: Vascular;  Laterality: N/A;  . CARDIAC CATHETERIZATION    . CARDIOVERSION N/A 10/09/2016   Procedure: CARDIOVERSION;  Surgeon: Lelon Perla, MD;  Location: [facility] ENDOSCOPY;  Service: Cardiovascular;  Laterality: N/A;  . CORONARY ARTERY BYPASS GRAFT    . DIALYSIS/PERMA CATHETER INSERTION N/A 10/22/2018   Procedure: DIALYSIS/PERMA CATHETER INSERTION;  Surgeon: Katha Cabal, MD;  Location: [facility] CV LAB;  Service: Cardiovascular;  Laterality: N/A;  . ELECTROPHYSIOLOGIC STUDY N/A 10/07/2015   Procedure: CARDIOVERSION;  Surgeon: Minna Merritts, MD;  Location: [facility] ORS;  Service: Cardiovascular;  Laterality: N/A;  . ELECTROPHYSIOLOGIC STUDY N/A 04/25/2016   Procedure: Cardioversion;  Surgeon: Minna Merritts, MD;  Location: [facility] ORS;  Service: Cardiovascular;  Laterality: N/A;  . ELECTROPHYSIOLOGIC STUDY N/A 07/18/2016   Procedure: Atrial Fibrillation Ablation;  Surgeon: Thompson Grayer, MD;  Location: [place] [facility] CV LAB;  Service: Cardiovascular;  Laterality: N/A;  . IMPLANTABLE CARDIOVERTER DEFIBRILLATOR IMPLANT  2005   [employer]   . TEE WITHOUT CARDIOVERSION N/A 07/18/2016  Procedure: TRANSESOPHAGEAL ECHOCARDIOGRAM (TEE);  Surgeon: Sueanne Margarita, MD;  Location: [facility] ENDOSCOPY;  Service: Cardiovascular;  Laterality: N/A;  . TEE WITHOUT CARDIOVERSION N/A 10/09/2016   Procedure: TRANSESOPHAGEAL ECHOCARDIOGRAM (TEE);  Surgeon: Lelon Perla, MD;  Location: [facility] ENDOSCOPY;  Service: Cardiovascular;  Laterality: N/A;    Social History   Socioeconomic History  . Marital status: Divorced    Spouse name: Not  on file  . Number of children: Not on file  . Years of education: Not on file  . Highest education level: Not on file  Occupational History  . Not on file  Social Needs  . Financial resource strain: Not on file  . Food insecurity:    Worry: Not on file    Inability: Not on file  . Transportation needs:    Medical: Not on file    Non-medical: Not on file  Tobacco Use  . Smoking status: Never Smoker  . Smokeless tobacco: Never Used  Substance and Sexual Activity  . Alcohol use: No  . Drug use: No  . Sexual activity: Not on file  Lifestyle  . Physical activity:    Days per week: Not on file    Minutes per session: Not on file  . Stress: Not on file  Relationships  . Social connections:    Talks on phone: Not on file    Gets together: Not on file    Attends religious service: Not on file    Active member of club or organization: Not on file    Attends meetings of clubs or organizations: Not on file    Relationship status: Not on file  . Intimate partner violence:    Fear of current or ex partner: Not on file    Emotionally abused: Not on file    Physically abused: Not on file    Forced sexual activity: Not on file  Other Topics Concern  . Not on file  Social History Narrative  . Not on file    Family History  Problem Relation Age of Onset  . CAD Mother   . Diabetes Mother   . Atrial fibrillation Mother   . Lung cancer Father   . Diabetes Father     Allergies  Allergen Reactions  . Piperacillin Other (See Comments)    Renal failure  . 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 See ID note**PER PT-CAUSED RENAL FAILURE**  . Zosyn [Piperacillin Sod-Tazobactam So] Other (See Comments)    Renal failure     Review of Systems   Review of Systems: Negative Unless Checked Constitutional: [] Weight loss  [] Fever  [] Chills Cardiac: [] Chest pain   []  Atrial Fibrillation  [] Palpitations   [] Shortness of breath when laying flat    [] Shortness of breath with exertion. [] Shortness of breath at rest Vascular:  [] Pain in legs with walking   [] Pain in legs with standing [] Pain in legs when laying flat   [] Claudication    [] Pain in feet when laying flat    [] History of DVT   [] Phlebitis   [] Swelling in legs   [] Varicose veins   [] Non-healing ulcers Pulmonary:   [] Uses home oxygen   [] Productive cough   [] Hemoptysis   [] Wheeze  [] COPD   [] Asthma Neurologic:  [] Dizziness   [] Seizures  [] Blackouts [] History of stroke   [] History of TIA  [] Aphasia   [] Temporary Blindness   [] Weakness or numbness in arm   [] Weakness or numbness in leg Musculoskeletal:   []   Joint swelling   [] Joint pain   [] Low back pain  []  History of Knee Replacement [] Arthritis [] back Surgeries  []  Spinal Stenosis    Hematologic:  [] Easy bruising  [] Easy bleeding   [] Hypercoagulable state   [x] Anemic Gastrointestinal:  [] Diarrhea   [] Vomiting  [] Gastroesophageal reflux/heartburn   [] Difficulty swallowing. [] Abdominal pain Genitourinary:  [x] Chronic kidney disease   [] Difficult urination  [] Anuric   [] Blood in urine [] Frequent urination  [] Burning with urination   [] Hematuria Skin:  [] Rashes   [] Ulcers [x] Wounds Psychological:  [] History of anxiety   []  History of major depression  []  Memory Difficulties      OBJECTIVE:   Physical Exam  BP 133/65   Pulse (!) 56   Resp 14   Ht 5\' 8"  (1.727 m)   Wt 201 lb (91.2 kg)   BMI 30.56 kg/m   Gen: WD/WN, NAD Head: [place]/AT, No temporalis wasting.  Ear/Nose/Throat: Hearing grossly intact, nares w/o erythema or drainage Eyes: PER, EOMI, sclera nonicteric.  Neck: Supple, no masses.  No JVD.  Pulmonary:  Good air movement, no use of accessory muscles.  Cardiac: RRR Vascular:  Multiple healing puncture sites from laparoscopic surgery.  No evidence of infection Vessel Right Left  Radial Palpable Palpable   Gastrointestinal: soft, non-distended. No guarding/no peritoneal signs.  Musculoskeletal: Wheelchair-bound. No  deformity or atrophy.  Neurologic: Pain and light touch intact in extremities.  Symmetrical.  Speech is fluent. Motor exam as listed above. Psychiatric: Judgment intact, Mood & affect appropriate for pt's clinical situation. Dermatologic: No Venous rashes. No Ulcers Noted.  No changes consistent with cellulitis. Lymph : No Cervical lymphadenopathy, no lichenification or skin changes of chronic lymphedema.       ASSESSMENT AND PLAN:  1. CKD (chronic kidney disease), stage IV [facility]) Patient has had peritoneal dialysis catheter successfully inserted.  Discussed with patient and her family signs and symptoms of possible catheter occlusion and when she may need to contact her office to evaluate whether the catheter is in the correct place or not.  Also discussed signs and symptoms of peritonitis.  We will defer teaching and management of peritoneal dialysis catheter her nephrologist.  Patient will be seen as needed for issues with her dialysis catheter.  2. Essential hypertension Continue antihypertensive medications as already ordered, these medications have been reviewed and there are no changes at this time.   3. Coronary artery disease involving native coronary artery of native heart with angina pectoris with documented spasm (HCC) Continue cardiac and antihypertensive medications as already ordered and reviewed, no changes at this time.  Continue statin as ordered and reviewed, no changes at this time  Nitrates PRN for chest pain    No current facility-administered medications on file prior to visit.    Current Outpatient Medications on File Prior to Visit  Medication Sig Dispense Refill  . amitriptyline (ELAVIL) 25 MG tablet Take 25 mg by mouth at bedtime.   11  . amLODipine (NORVASC) 10 MG tablet TAKE 1 TABLET(10 MG) BY MOUTH DAILY 90 tablet 0  . apixaban (ELIQUIS) 5 MG TABS tablet Take 1 tablet (5 mg total) by mouth 2 (two) times daily. 60 tablet 9  . atorvastatin (LIPITOR) 40 MG  tablet Take 40 mg by mouth at bedtime.   11  . Cholecalciferol (HM VITAMIN D3) 100 MCG (4000 UT) CAPS Take 4,000 Units by mouth daily.     Marland Kitchen gabapentin (NEURONTIN) 100 MG capsule Take 100 mg by mouth 2 (two) times daily.    Marland Kitchen  insulin aspart (NOVOLOG) 100 UNIT/ML injection Inject 5-10 Units into the skin 3 (three) times daily before meals. 10 units into the skin before breakfast then 5 units before lunch then 10 units before supper (evening meal)    . insulin glargine (LANTUS) 100 UNIT/ML injection Inject 0.15 mLs (15 Units total) into the skin 2 (two) times daily. 10 mL 1  . iron polysaccharides (NIFEREX) 150 MG capsule Take 150 mg by mouth daily.    . isosorbide mononitrate (IMDUR) 30 MG 24 hr tablet TAKE 2 TABLETS(60 MG) BY MOUTH TWICE DAILY 360 tablet 1  . Multiple Vitamins-Minerals (VITEYES AREDS FORMULA/LUTEIN) CAPS Take 1 capsule by mouth 2 (two) times daily.    . nitroGLYCERIN (NITROSTAT) 0.4 MG SL tablet Place 1 tablet (0.4 mg total) under the tongue every 5 (five) minutes as needed for chest pain. 30 tablet 0  . pantoprazole (PROTONIX) 40 MG tablet Take 40 mg by mouth at bedtime.    . sertraline (ZOLOFT) 100 MG tablet Take 100 mg by mouth at bedtime.   11  . vitamin C (ASCORBIC ACID) 500 MG tablet Take 500 mg by mouth daily.      There are no Patient Instructions on file for this visit. No follow-ups on file.   Kris Hartmann, NP  This note was completed with Sales executive.  Any errors are purely unintentional.

## 2018-11-21 NOTE — Progress Notes (Signed)
Progress Note  Patient Name: Adeli Frost Date of Encounter: 11/21/2018  Primary Cardiologist: Ida Rogue, MD  Subjective   Feels much better today.  No chest pain or dyspnea.  Inpatient Medications    Scheduled Meds: . amitriptyline  25 mg Oral QHS  . amLODipine  10 mg Oral Daily  . aspirin  81 mg Oral Daily  . aspirin EC  81 mg Oral Daily  . atorvastatin  40 mg Oral q1800  . Chlorhexidine Gluconate Cloth  6 each Topical Q0600  . clopidogrel  75 mg Oral Q breakfast  . gabapentin  100 mg Oral BID  . insulin aspart  0-5 Units Subcutaneous QHS  . insulin aspart  0-9 Units Subcutaneous TID WC  . insulin glargine  15 Units Subcutaneous BID  . iron polysaccharides  150 mg Oral Daily  . isosorbide mononitrate  30 mg Oral Daily  . metoprolol tartrate  12.5 mg Oral BID  . pantoprazole  40 mg Oral QHS  . sertraline  100 mg Oral QHS  . sodium chloride flush  3 mL Intravenous Once  . sodium chloride flush  3 mL Intravenous Q12H  . sodium chloride flush  3 mL Intravenous Q12H   Continuous Infusions: . sodium chloride 5 mL/hr at 11/20/18 1554  . sodium chloride     PRN Meds: sodium chloride, sodium chloride, acetaminophen, acetaminophen, ALPRAZolam, guaiFENesin-dextromethorphan, hydrOXYzine, nitroGLYCERIN, ondansetron (ZOFRAN) IV, ondansetron (ZOFRAN) IV, sodium chloride flush, sodium chloride flush, zolpidem   Vital Signs    Vitals:   11/21/18 0500 11/21/18 0600 11/21/18 0700 11/21/18 0800  BP: 134/73 137/77 140/71   Pulse: 75 72 74   Resp: 17 19 19    Temp:    98 F (36.7 C)  TempSrc:    Axillary  SpO2: 91% 91% 92%   Weight:      Height:        Intake/Output Summary (Last 24 hours) at 11/21/2018 1046 Last data filed at 11/21/2018 0600 Gross per 24 hour  Intake 237.9 ml  Output 1500 ml  Net -1262.1 ml   Filed Weights   11/20/18 1730 11/20/18 1800 11/20/18 2115  Weight: 103.8 kg 103.8 kg 99.9 kg    Physical Exam   GEN: Well nourished, well developed, in  no acute distress.  HEENT: Grossly normal.  Neck: Supple, difficult to gauge JVP secondary to body habitus.  No carotid bruits, or masses. Cardiac: Irregularly irregular, distant heart sounds, no murmurs, rubs, or gallops. No clubbing, cyanosis, edema.  Right femoral catheterization site without bleeding, bruit, or hematoma.  Bilateral BKAs. Respiratory:  Respirations regular and unlabored, Bibasilar crackles. GI: Obese, soft, nontender, nondistended, BS + x 4. MS: no deformity or atrophy.  Bilateral BKAs. Skin: warm and dry, no rash. Neuro:  Strength and sensation are intact. Psych: AAOx3.  Normal affect.  Labs    Chemistry Recent Labs  Lab 11/18/18 1722 11/20/18 0511 11/21/18 0554  NA 135 133* 133*  K 4.7 4.7 5.0  CL 96* 97* 96*  CO2 26 26 23   GLUCOSE 126* 144* 106*  BUN 29* 24* 43*  CREATININE 3.13* 2.61* 4.04*  CALCIUM 9.4 8.3* 8.2*  GFRNONAA 14* 18* 11*  GFRAA 17* 21* 12*  ANIONGAP 13 10 14      Hematology Recent Labs  Lab 11/18/18 1722 11/20/18 0511 11/21/18 0554  WBC 46.1* 43.8* 43.1*  RBC 4.15 4.14 3.96  HGB 10.9* 10.8* 10.4*  HCT 35.6* 36.2 35.0*  MCV 85.8 87.4 88.4  MCH 26.3 26.1 26.3  MCHC 30.6 29.8* 29.7*  RDW 19.9* 20.3* 20.2*  PLT 278 230 231    Cardiac Enzymes Recent Labs  Lab 11/18/18 2255 11/19/18 0407 11/19/18 1012 11/19/18 1652  TROPONINI 3.84* 7.68* 8.26* 5.09*      Radiology    Dg Chest Port 1 View  Result Date: 11/21/2018 CLINICAL DATA:  Respiratory failure. EXAM: PORTABLE CHEST 1 VIEW COMPARISON:  11/18/2018. FINDINGS: The heart is enlarged. AICD device unchanged. Prior CABG. Mild vascular congestion. Stable dialysis catheter. No consolidation or frank edema. IMPRESSION: Stable exam.  No acute findings. Electronically Signed   By: Staci Righter M.D.   On: 11/21/2018 08:19    Telemetry    Atrial fibrillation, 60s- Personally Reviewed  Cardiac Studies   2D Echocardiogram 1.2020  1. The left ventricle has moderately reduced  systolic function of 10-17%. The cavity size is mildly increased. There is no left ventricular wall thickness. The left ventricular diastology could not be evaluated due to nondiagnostic images.  2. There is severe hypokinesis of the entire septal, anteroseptal and anterior left ventricular segments.  3. Mildly dilated left atrial size.  4. The mitral valve normal in structure. Regurgitation is mild by color flow Doppler.  5. Normal tricuspid valve.  6. Tricuspid regurgitation mild-moderate.  7. The aortic valve normal. There is moderate sclerosis of the aortic valve.  8. The right ventricle is mildly enlarged in size. There is normal systolic function. Right ventricular systolic pressure is mildly elevated with an estimated pressure of 42.4 mmHg. _____________  Cardiac Catheterization and Percutaneous Coronary Intervention 3.11.2020  Coronary angiography:  Coronary dominance: Right  Left mainstem: Large vessel that bifurcates into the LAD and left circumflex, moderate diffuse disease  Left anterior descending (LAD): Moderate-sized vessel , occluded proximally  LIMA to the mid LAD is patent . LAD beyond the LIMA graft is small and diffusely diseased . Diagonal vessels small and diffusely diseased . Proximal to mid LAD fills by retrograde flow from LIMA   Left circumflex: Essentially occluded in the proximal region, very small vessel mid to distal. High OM appears to have proximal stent followed by CTO  Right coronary artery (RCA): Native RCA presumed to be occluded from proximal to mid region Vein graft to the RCA patent, large vessel, 90% disease mid PDA  SVG to the RCA/PDA: Patent, no significant disease Saphenous vein graft to OM, 99% disease at the anastomosis, distal edge of the stent  **The obtuse marginal successfully stented using a 2.5 x 12 mm Resolute Onyx drug-eluting stent.**  Left ventriculography: Aortic valve crossed for pressures, no significant aortic valve stenosis,  left ventricular end-diastolic pressure 27 _____________    Patient Profile     74 y.o. female with a history of CAD, hypertension, hyperlipidemia, ischemic cardiomyopathy, chronic combined systolic and diastolic congestive heart failure, peripheral arterial disease status post bilateral BKA's, end-stage renal disease, and persistent atrial fibrillation, who was admitted with non-STEMI and is now status post PCI and drug-eluting stent placement to the obtuse marginal at the anastomosis of the vein graft.  Assessment & Plan    1.  Non-STEMI/CAD: Status post catheterization yesterday revealing 3 of 3 patent grafts with severe disease at the anastomosis of the vein graft to the obtuse marginal.  She is now status post PCI.  She did have dyspnea during the procedure and required urgent dialysis.  No chest pain or dyspnea overnight.  She remains on aspirin, statin, Plavix, beta-blocker, and nitrate therapy.  Plan to resume Eliquis today. ? Duration  of ASA - will d/w Dr. Rockey Situ.  2.  Persistent atrial fibrillation: Rate controlled.  Eliquis on hold in the setting of catheterization/PCI.  Plan to resume today.  3.  Ischemic cardiomyopathy: EF 35 to 40% by recent echo.  Volume management per nephrology status post dialysis last night.  Continue beta-blocker therapy and consider addition of Entresto if okay with nephrology.  4.  Peripheral arterial disease: Status post bilateral below the knee amputations.  Uses prostheses.  Continue statin therapy.  5.  End-stage renal disease: HD per nephrology.  6.  Normocytic anemia: Stable.  Signed, Murray Hodgkins, NP  11/21/2018, 10:46 AM    For questions or updates, please contact   Please consult [email or web address] for contact info under Cardiology/STEMI.

## 2018-11-21 NOTE — Telephone Encounter (Signed)
Received request for physician completion and signature for DME for oxygen for patient from [place]. Discussed this with provider, Ignacia Bayley, NP, who advised we would not be handling this DME. Paperwork with fax letter with note to contact PCP or pulmonologist for DME.

## 2018-11-21 NOTE — TOC Initial Note (Signed)
Transition of Care [facility]) - Initial/Assessment Note    Patient Details  Name: Caroline Hernandez MRN: [id number] Date of Birth: [birth year]  Transition [facility]) CM/SW Contact:    Shelbie Hutching, RN Phone Number: 11/21/2018, 3:28 PM  Clinical Narrative:                 Patient is from home and lives with her daughter and her son in law.  Patient is a new dialysis patient and is in training for PD.  Patient is getting HD in the hospital.  Elvera Bicker with [facility] is aware of admission.  Patient at last discharge was set up with home health services with [facility].  Patient expressed to [facility] that she might want to go to SNF for short term rehab.  PT evaluation placed.  If patient does go to a facility short term [facility] will need to place her in a temporary dialysis clinic.  RNCM will cont to follow.   Expected Discharge Plan: [place]     Patient Goals and CMS Choice Patient states their goals for this hospitalization and ongoing recovery are:: Patient thinks she may want to go to short term skilled nursing for rehab.      Expected Discharge Plan and Services Expected Discharge Plan: [place] Discharge Planning Services: CM Consult   Living arrangements for the past 2 months: Single Family Home                          Prior Living Arrangements/Services Living arrangements for the past 2 months: Single Family Home Lives with:: Adult Children(daughter)   Do you feel safe going back to the place where you live?: Yes      Need for Family Participation in Patient Care: Yes (Comment) Care giver support system in place?: Yes (comment) Current home services: DME(oxygen)    Activities of Daily Living Home Assistive Devices/Equipment: CBG Meter, Blood pressure cuff, Prosthesis, Shower chair without back, Walker (specify type), Oxygen ADL Screening (condition at time of admission) Patient's cognitive ability adequate to safely complete  daily activities?: Yes Is the patient deaf or have difficulty hearing?: No Does the patient have difficulty seeing, even when wearing glasses/contacts?: No Does the patient have difficulty concentrating, remembering, or making decisions?: No Patient able to express need for assistance with ADLs?: Yes Does the patient have difficulty dressing or bathing?: No Independently performs ADLs?: Yes (appropriate for developmental age) Does the patient have difficulty walking or climbing stairs?: No Weakness of Legs: Both Weakness of Arms/Hands: Both  Permission Sought/Granted Permission sought to share information with : Other (comment), Family Supports, Case Manager Permission granted to share information with : Yes, Verbal Permission Granted     Permission granted to share info w AGENCY: Any SNF   Permission granted to share info w Relationship: Daughter     Emotional Assessment Appearance:: Appears stated age Attitude/Demeanor/Rapport: Lethargic Affect (typically observed): Quiet, Calm(tired) Orientation: : Oriented to Self, Oriented to Place, Oriented to  Time, Oriented to Situation Alcohol / Substance Use: Not Applicable Psych Involvement: No (comment)  Admission diagnosis:  Acute respiratory failure with hypoxia (HCC) [J96.01] Right upper quadrant abdominal pain [R10.11] Acute bronchitis, unspecified organism [J20.9] Patient Active Problem List   Diagnosis Date Noted  . NSTEMI (non-ST elevated myocardial infarction) ([place]) 11/18/2018  . CLL (chronic lymphocytic leukemia) ([place]) 10/23/2018  . Pressure injury of skin 10/19/2018  . CHF (congestive heart failure) (  [place]) 10/16/2018  . CAP (community acquired pneumonia) 04/14/2018  . UTI (urinary tract infection) 03/07/2018  . Atrial fibrillation with slow ventricular response ([name]) 07/18/2016  . Chronic diastolic CHF (congestive heart failure) ([place])   . SOB (shortness of breath)   . Acute on chronic systolic heart failure ([place])  08/20/2015  . CKD (chronic kidney disease), stage IV ([facility])   . Ischemic cardiomyopathy   . Chronic combined systolic and diastolic CHF (congestive heart failure) ([place])   . Essential hypertension 06/18/2015  . Systolic and diastolic CHF, acute on chronic ([place])   . Acute on chronic renal failure ([place])   . Angina pectoris ([place])   . Coronary artery disease involving native coronary artery of native heart with angina pectoris with documented spasm ([name])   . 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 Persistent atrial fibrillation   . Bradycardia   . Chest pain 06/12/2015   PCP:  Apolonio Schneiders, MD Pharmacy:   [id number] - [address], [place] [address] Phone: [phone] Fax: [phone]     Social Determinants of Health (SDOH) Interventions    Readmission Risk Interventions 30 Day Unplanned Readmission Risk Score     ED to Hosp-Admission (Current) from 11/18/2018 in [place] ICU/CCU  30 Day Unplanned Readmission Risk Score (%)  38 Filed at 11/21/2018 1200     This score is the patient's risk of an unplanned readmission within 30 days of being discharged (0 -100%). The score is based on dignosis, age, lab data, medications, orders, and past utilization.   Low:  0-14.9   Medium: 15-21.9   High: 22-29.9   Extreme: 30 and above       No flowsheet data found.

## 2018-11-21 NOTE — Progress Notes (Signed)
HD Tx Start   11/21/18 1600  Vital Signs  Pulse Rate 66  Resp (!) 22  BP 132/71  Oxygen Therapy  SpO2 95 %  During Hemodialysis Assessment  Blood Flow Rate (mL/min) 300 mL/min  Arterial Pressure (mmHg) -180 mmHg  Venous Pressure (mmHg) 160 mmHg  Transmembrane Pressure (mmHg) 60 mmHg  Ultrafiltration Rate (mL/min) 990 mL/min  Dialysate Flow Rate (mL/min) 600 ml/min  Conductivity: Machine  14  HD Safety Checks Performed Yes  Dialysis Fluid Bolus Normal Saline  Bolus Amount (mL) 250 mL  Intra-Hemodialysis Comments Tx initiated  Hemodialysis Catheter  Placement Date: 10/21/18    Site Condition No complications  Blue Lumen Status Infusing  Red Lumen Status Infusing  Purple Lumen Status N/A  Dressing Type Biopatch  Dressing Status Clean;Intact;Dry  Drainage Description None

## 2018-11-21 NOTE — Progress Notes (Signed)
HD Tx End   11/21/18 1828  Hand-Off documentation  Report given to (Full Name) Sarah ICU 9 RN  Report received from (Full Name) Trellis Paganini  Vital Signs  Temp 98.2 F (36.8 C)  Temp Source Oral  Pulse Rate 66  Pulse Rate Source Monitor  Resp 15  BP 115/79  BP Location Left Arm  BP Method Automatic  Patient Position (if appropriate) Lying  Oxygen Therapy  SpO2 96 %  O2 Device Nasal Cannula  O2 Flow Rate (L/min) 6 L/min  During Hemodialysis Assessment  Blood Flow Rate (mL/min) 200 mL/min  Arterial Pressure (mmHg) -160 mmHg  Venous Pressure (mmHg) 180 mmHg  Transmembrane Pressure (mmHg) 60 mmHg  Ultrafiltration Rate (mL/min) 990 mL/min  Dialysate Flow Rate (mL/min) 600 ml/min  Conductivity: Machine  14  HD Safety Checks Performed Yes  KECN 40.2 KECN  Dialysis Fluid Bolus Normal Saline  Bolus Amount (mL) 250 mL  Intra-Hemodialysis Comments Tx completed;Tolerated well  Hemodialysis Catheter  Placement Date: 10/21/18    Site Condition No complications  Blue Lumen Status Saline locked;Flushed;Capped (Central line);Heparin locked  Red Lumen Status Flushed;Saline locked;Capped (Central line);Heparin locked  Purple Lumen Status N/A  Catheter fill solution Heparin 1000 units/ml  Catheter fill volume (Arterial) 1.6 cc  Catheter fill volume (Venous) 1.6  Dressing Type Biopatch  Dressing Status Clean;Intact;Dry  Post treatment catheter status Capped and Clamped

## 2018-11-22 DIAGNOSIS — J209 Acute bronchitis, unspecified: Secondary | ICD-10-CM

## 2018-11-22 DIAGNOSIS — I214 Non-ST elevation (NSTEMI) myocardial infarction: Principal | ICD-10-CM

## 2018-11-22 LAB — CBC WITH DIFFERENTIAL/PLATELET
Abs Immature Granulocytes: 0.09 10*3/uL — ABNORMAL HIGH (ref 0.00–0.07)
Basophils Absolute: 0.1 10*3/uL (ref 0.0–0.1)
Basophils Relative: 0 %
Eosinophils Absolute: 0.2 10*3/uL (ref 0.0–0.5)
Eosinophils Relative: 1 %
HCT: 32.9 % — ABNORMAL LOW (ref 36.0–46.0)
Hemoglobin: 10 g/dL — ABNORMAL LOW (ref 12.0–15.0)
Immature Granulocytes: 0 %
Lymphocytes Relative: 78 %
Lymphs Abs: 24.3 10*3/uL — ABNORMAL HIGH (ref 0.7–4.0)
MCH: 26.3 pg (ref 26.0–34.0)
MCHC: 30.4 g/dL (ref 30.0–36.0)
MCV: 86.6 fL (ref 80.0–100.0)
MONO ABS: 0.9 10*3/uL (ref 0.1–1.0)
MONOS PCT: 3 %
NEUTROS ABS: 5.8 10*3/uL (ref 1.7–7.7)
NEUTROS PCT: 18 %
Platelets: 216 10*3/uL (ref 150–400)
RBC: 3.8 MIL/uL — ABNORMAL LOW (ref 3.87–5.11)
RDW: 20.4 % — ABNORMAL HIGH (ref 11.5–15.5)
Smear Review: NORMAL
WBC: 31.4 10*3/uL — ABNORMAL HIGH (ref 4.0–10.5)
nRBC: 0 % (ref 0.0–0.2)

## 2018-11-22 LAB — BASIC METABOLIC PANEL
Anion gap: 12 (ref 5–15)
BUN: 36 mg/dL — ABNORMAL HIGH (ref 8–23)
CALCIUM: 8.3 mg/dL — AB (ref 8.9–10.3)
CO2: 25 mmol/L (ref 22–32)
CREATININE: 4.46 mg/dL — AB (ref 0.44–1.00)
Chloride: 95 mmol/L — ABNORMAL LOW (ref 98–111)
GFR calc Af Amer: 11 mL/min — ABNORMAL LOW (ref >60)
GFR calc non Af Amer: 9 mL/min — ABNORMAL LOW (ref >60)
Glucose, Bld: 94 mg/dL (ref 70–99)
Potassium: 4.2 mmol/L (ref 3.5–5.1)
Sodium: 132 mmol/L — ABNORMAL LOW (ref 135–145)

## 2018-11-22 LAB — GLUCOSE, CAPILLARY
Glucose-Capillary: 80 mg/dL (ref 70–99)
Glucose-Capillary: 113 mg/dL — ABNORMAL HIGH (ref 70–99)
Glucose-Capillary: 166 mg/dL — ABNORMAL HIGH (ref 70–99)
Glucose-Capillary: 152 mg/dL — ABNORMAL HIGH (ref 70–99)

## 2018-11-22 LAB — HEPATITIS B SURFACE ANTIGEN: Hepatitis B Surface Ag: NEGATIVE

## 2018-11-22 LAB — PATHOLOGIST SMEAR REVIEW

## 2018-11-22 NOTE — Telephone Encounter (Signed)
Noted- Dr. Rockey Situ has seen the patient during this current hospitalization and done a cardiac cath.   Patient admitted with NSTEMI

## 2018-11-22 NOTE — Progress Notes (Signed)
[place] at [place] NAME: Caroline Hernandez    MR#:  [id number]  DATE OF BIRTH:  August 11, 1946  SUBJECTIVE:  CHIEF COMPLAINT:   Chief Complaint  Patient presents with  . Chest Pain   -Still remains on 5 to 6 L of oxygen which is acute.  Complains of generalized weakness.  To be transferred to telemetry today  REVIEW OF SYSTEMS:  Review of Systems  Constitutional: Positive for malaise/fatigue. Negative for chills and fever.  HENT: Negative for ear discharge, hearing loss and nosebleeds.   Eyes: Negative for blurred vision and double vision.  Respiratory: Positive for shortness of breath. Negative for cough and wheezing.   Cardiovascular: Positive for orthopnea. Negative for chest pain, palpitations and leg swelling.  Gastrointestinal: Negative for abdominal pain, constipation, diarrhea, nausea and vomiting.  Genitourinary: Negative for dysuria.  Neurological: Negative for dizziness, focal weakness, seizures, weakness and headaches.  Psychiatric/Behavioral: Negative for depression.    DRUG ALLERGIES:   Allergies  Allergen Reactions  . Piperacillin Other (See Comments)    Renal failure  . 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 See ID note**PER PT-CAUSED RENAL FAILURE**  . Zosyn [Piperacillin Sod-Tazobactam So] Other (See Comments)    Renal failure    VITALS:  Blood pressure 125/77, pulse 66, temperature 98.3 F (36.8 C), temperature source Oral, resp. rate 16, height 5\' 8"  (1.727 m), weight 97.6 kg, SpO2 98 %.  PHYSICAL EXAMINATION:  Physical Exam  GENERAL:  73 y.o.-year-old patient lying in the bed with no acute distress.  EYES: Pupils equal, round, reactive to light and accommodation. No scleral icterus. Extraocular muscles intact.  HEENT: Head atraumatic, normocephalic. Oropharynx and nasopharynx clear.  NECK:  Supple, no jugular venous distention. No thyroid enlargement, no tenderness.   LUNGS: Normal breath sounds bilaterally, no wheezing, rales,rhonchi or crepitation. No use of accessory muscles of respiration.  Basilar crackles heard CARDIOVASCULAR: S1, S2 normal. No   rubs, or gallops. 2/6 systolic murmur present ABDOMEN: Soft, nontender, nondistended. Abdominal PD catheter present. Bowel sounds present. No organomegaly or mass.  EXTREMITIES: s/p bilateral BKA Right chest permacath present NEUROLOGIC: Cranial nerves II through XII are intact. Muscle strength 5/5 in all extremities. Sensation intact. Gait not checked.  Global weakness noted PSYCHIATRIC: The patient is alert and oriented x 3.  SKIN: No obvious rash, lesion, or ulcer.    LABORATORY PANEL:   CBC Recent Labs  Lab 11/22/18 0954  WBC 31.4*  HGB 10.0*  HCT 32.9*  PLT 216   ------------------------------------------------------------------------------------------------------------------  Chemistries  Recent Labs  Lab 11/18/18 2255  11/22/18 0954  NA  --    < > 132*  K  --    < > 4.2  CL  --    < > 95*  CO2  --    < > 25  GLUCOSE  --    < > 94  BUN  --    < > 36*  CREATININE  --    < > 4.46*  CALCIUM  --    < > 8.3*  MG 2.0  --   --    < > = values in this interval not displayed.   ------------------------------------------------------------------------------------------------------------------  Cardiac Enzymes Recent Labs  Lab 11/19/18 1652  TROPONINI 5.09*   ------------------------------------------------------------------------------------------------------------------  RADIOLOGY:  Dg Chest Port 1 View  Result Date: 11/21/2018 CLINICAL DATA:  Respiratory failure. EXAM: PORTABLE CHEST 1 VIEW COMPARISON:  11/18/2018. FINDINGS: The  heart is enlarged. AICD device unchanged. Prior CABG. Mild vascular congestion. Stable dialysis catheter. No consolidation or frank edema. IMPRESSION: Stable exam.  No acute findings. Electronically Signed   By: Staci Righter M.D.   On: 11/21/2018 08:19     EKG:   Orders placed or performed during the hospital encounter of 11/18/18  . EKG 12-Lead  . EKG 12-Lead  . ED EKG  . ED EKG  . EKG 12-Lead  . EKG 12-Lead    ASSESSMENT AND PLAN:   73 year old female with multiple past medical history significant for history of CAD status post CABG, CLL, diabetes, PAD status post bilateral BKA, chronic diastolic heart failure, anemia, end-stage renal disease on dialysis, atrial fibrillation status post ablation and pacer/ICD on Eliquis comes to hospital secondary to chest pain.  1. NSTEMI-known significant cardiac history -Appreciate cardiology consult.  Was started on heparin drip-now off after cardiac cath -Troponin elevated, status post cardiac cath revealing patent grafts but severe disease of the vein graft to obtuse marginal and status post PCI - on aspirin and Plavix.  Continue statin, beta-blocker. -Aspirin can be stopped after 30 days.  At that time patient will continue her Plavix and Eliquis -Last echocardiogram with EF of 35 to 40% -Continue cardiac medications. -currently on oxygen 5-6L which is acute, continue to wean as tolerated-incentive spirometry given.  Significant global weakness noted.  Also requiring dialysis per pulmonary edema -Cardiac rehab after discharge  2.  End-stage renal disease on dialysis-patient initiated on hemodialysis pretty recently. -Finished training for home PD dialysis.  consulted nephrology. -on hemodialysis here, receiving hemodialysis for the last 2 days, ultrafiltration again due to fluid overload and hypoxia.Marland Kitchen  3.  Atrial fibrillation-status post prior ablations, continue rate control, on low-dose metoprolol -Currently on heparin drip, will need to be resumed on Eliquis likely today..  4.  Leukocytosis--secondary to her CLL.  Monitor -Continue outpatient follow-up  5.  Hypertension-on Norvasc, Imdur, metoprolol  6.  Diabetes mellitus-on Lantus and sliding scale insulin  7.  DVT prophylaxis-on  Eliquis again   Will need home health at discharge.    -Patient has bilateral prostheses for the lower extremities Discharge in the next 1 to 2 days once able to be weaned off the oxygen -Physical therapy consulted   All the records are reviewed and case discussed with Care Management/Social Workerr. Management plans discussed with the patient, family and they are in agreement.  CODE STATUS: Full Code  TOTAL TIME TAKING CARE OF THIS PATIENT: 37 minutes.   POSSIBLE D/C IN 1-2 DAYS, DEPENDING ON CLINICAL CONDITION.   Gladstone Lighter M.D on 11/22/2018 at 1:24 PM  Between 7am to 6pm - Pager - [phone]  After 6pm go to [email or web address] - password [email or web address] [facility]  Office  [phone]  CC: Primary care physician; Apolonio Schneiders, MD

## 2018-11-22 NOTE — Progress Notes (Signed)
Patient is telemetry overflow.  Not seen officially by [facility] service today.  We are available as needed.  Merton Border, MD PCCM service Mobile [phone] Pager [phone] 11/22/2018 12:13 PM

## 2018-11-22 NOTE — Evaluation (Signed)
Physical Therapy Evaluation Patient Details Name: Caroline Hernandez MRN: [id number] DOB: Sep 09, 1946 Today's Date: 11/22/2018   History of Present Illness  72 y.o. [place] female with atrial fibrillation, hypertension, diabetes mellitus type II insulin dependent, diabetic neuropathy, congestive heart failure, coronary artery disease status post CABG, GERD, CLL, bilateral below the knee amputations admitted with cough, shortness of breath, UTI and initiated on hemodialysis this admission.     Clinical Impression  Patient easily woken at start of session A&Ox4, behavior WFLs. Patient able to report PLOF, previously ambulated with RW but has been relying on motorized scooter recently.   Patient able to don/doff prosthetic legs bilaterally with CGA-minAx1 for safety, able to perform bed mobility with minAx1 as well. Sit <> Stand attempted x2 this session modA-minAx2 and RW. Patient unable to ambulate at this time due to fatigue and complaints of SOB. Vitals WFLs throughout mobility.  Overall the patient demonstrated deficits (see "PT Problem List") that impede the patient's functional abilities, safety, and mobility and would benefit from skilled PT intervention. Recommendation is STR due to current level of assistance needed.      Follow Up Recommendations SNF    Equipment Recommendations  Other (comment)(TBD at next venue of care)    Recommendations for Other Services       Precautions / Restrictions Precautions Precautions: Fall Precaution Comments: HR, O2 Required Braces or Orthoses: Other Brace(B knee prosthetics) Restrictions Weight Bearing Restrictions: No RLE Weight Bearing: Weight bearing as tolerated LLE Weight Bearing: Weight bearing as tolerated      Mobility  Bed Mobility Overal bed mobility: Needs Assistance Bed Mobility: Supine to Sit;Sit to Supine     Supine to sit: Min assist Sit to supine: Min assist      Transfers Overall transfer level: Needs  assistance Equipment used: Rolling walker (2 wheeled) Transfers: Sit to/from Stand Sit to Stand: Mod assist         General transfer comment: Patient able to perform x2 with modAx2 and minAx2 second attempt. Patient quickly fatigued, difficulty with upright posture. Vitals WFLs throughout  Ambulation/Gait             General Gait Details: unable to perform at this time  Stairs            Wheelchair Mobility    Modified Rankin (Stroke Patients Only)       Balance Overall balance assessment: Needs assistance Sitting-balance support: Feet supported Sitting balance-Leahy Scale: Fair                                       Pertinent Vitals/Pain Pain Assessment: No/denies pain    Home Living Family/patient expects to be discharged to:: Private residence Living Arrangements: Children Available Help at Discharge: Family Type of Home: House Home Access: Ramped entrance     Home Layout: One level Home Equipment: Environmental consultant - 2 wheels;Bedside commode;Shower seat;Electric scooter;Wheelchair - manual;Hospital bed Additional Comments: has family in the evening and nearby help    Prior Function Level of Independence: Independent with assistive device(s)         Comments: pt states she typically does cooking and cleaning,  family shops and assists to sort pills.  Stated recently she has been using her scooter a lot for mobility.     Hand Dominance   Dominant Hand: Right    Extremity/Trunk Assessment   Upper Extremity Assessment Upper Extremity Assessment: Overall WFL for tasks  assessed    Lower Extremity Assessment Lower Extremity Assessment: Overall WFL for tasks assessed    Cervical / Trunk Assessment Cervical / Trunk Assessment: Normal  Communication   Communication: No difficulties  Cognition Arousal/Alertness: Awake/alert Behavior During Therapy: WFL for tasks assessed/performed Overall Cognitive Status: Within Functional Limits for  tasks assessed                                        General Comments      Exercises     Assessment/Plan    PT Assessment Patient needs continued PT services  PT Problem List Decreased range of motion;Decreased activity tolerance;Decreased balance;Decreased mobility;Decreased coordination;Decreased knowledge of use of DME;Decreased safety awareness;Cardiopulmonary status limiting activity;Obesity       PT Treatment Interventions DME instruction;Gait training;Functional mobility training;Therapeutic activities;Therapeutic exercise;Balance training;Neuromuscular re-education;Patient/family education    PT Goals (Current goals can be found in the Care Plan section)  Acute Rehab PT Goals Patient Stated Goal: to go home PT Goal Formulation: With patient Time For Goal Achievement: 12/06/18 Potential to Achieve Goals: Good    Frequency Min 2X/week   Barriers to discharge Decreased caregiver support      Co-evaluation               AM-PAC PT "6 Clicks" Mobility  Outcome Measure Help needed turning from your back to your side while in a flat bed without using bedrails?: A Little Help needed moving from lying on your back to sitting on the side of a flat bed without using bedrails?: A Little Help needed moving to and from a bed to a chair (including a wheelchair)?: A Lot Help needed standing up from a chair using your arms (e.g., wheelchair or bedside chair)?: A Lot Help needed to walk in hospital room?: Total Help needed climbing 3-5 steps with a railing? : Total 6 Click Score: 12    End of Session Equipment Utilized During Treatment: Oxygen;Gait belt;Other (comment)(B/l Knee prosthetics, oxygen at 4L) Activity Tolerance: Patient limited by fatigue Patient left: in bed;with call bell/phone within reach;with bed alarm set;with family/visitor present Nurse Communication: Mobility status PT Visit Diagnosis: Unsteadiness on feet (R26.81);Other abnormalities  of gait and mobility (R26.89);Difficulty in walking, not elsewhere classified (R26.2)    Time: 5997-7414 PT Time Calculation (min) (ACUTE ONLY): 24 min   Charges:   PT Evaluation $PT Eval Moderate Complexity: 1 Mod PT Treatments $Therapeutic Activity: 8-22 mins        Lieutenant Diego PT, DPT 4:38 PM,11/22/18 [phone]

## 2018-11-22 NOTE — Progress Notes (Signed)
[facility]  ROUNDING NOTE   Subjective:   Patient known to our practice from outpatient dialysis as well as previous admissions.  This time she presents for chest discomfort and right shoulder pain.  Found to have elevated troponins.  Total of 6000 cc of fluid removed with hemodialysis  Currently still requiring 5 L oxygen nasal cannula supplementation Otherwise feels well   Objective:  Vital signs in last 24 hours:  Temp:  [98.2 F (36.8 C)-98.4 F (36.9 C)] 98.3 F (36.8 C) (03/13 0208) Pulse Rate:  [63-77] 72 (03/13 0208) Resp:  [15-25] 18 (03/13 0208) BP: (114-137)/(65-84) 118/65 (03/13 0208) SpO2:  [90 %-100 %] 95 % (03/13 0208) Weight:  [97.6 kg-99.9 kg] 97.6 kg (03/12 1830)  Weight change: -3.97 kg Filed Weights   11/20/18 2115 11/21/18 1545 11/21/18 1830  Weight: 99.9 kg 99.9 kg 97.6 kg    Intake/Output: I/O last 3 completed shifts: In: 1320.9 [P.O.:1080; I.V.:240.9] Out: 3570 [Urine:70; Other:3500]   Intake/Output this shift:  No intake/output data recorded.  Physical Exam: General: No acute distress  Head: Normocephalic, atraumatic. Moist oral mucosal membranes  Eyes: Anicteric  Neck: Supple,  Lungs:   Mild basilar crackles, normal effort, oxygen by nasal cannula  Heart:  Irregular rhythm 2/6 murmur,  Abdomen:  Soft, PD catheter in place  Extremities: B/L BKA  Neurologic: Awake, alert, following commands  Skin: No lesions  Access:  RIJ permcath 2/12 Dr. Delana Meyer, PD catheter Dr. Delana Meyer 2/20    Basic Metabolic Panel: Recent Labs  Lab 11/18/18 1722 11/18/18 2255 11/20/18 0511 11/21/18 0554  NA 135  --  133* 133*  K 4.7  --  4.7 5.0  CL 96*  --  97* 96*  CO2 26  --  26 23  GLUCOSE 126*  --  144* 106*  BUN 29*  --  24* 43*  CREATININE 3.13*  --  2.61* 4.04*  CALCIUM 9.4  --  8.3* 8.2*  MG  --  2.0  --   --     Liver Function Tests: No results for input(s): AST, ALT, ALKPHOS, BILITOT, PROT, ALBUMIN in the last 168 hours. No  results for input(s): LIPASE, AMYLASE in the last 168 hours. No results for input(s): AMMONIA in the last 168 hours.  CBC: Recent Labs  Lab 11/18/18 1722 11/20/18 0511 11/21/18 0554 11/22/18 0954  WBC 46.1* 43.8* 43.1* 31.4*  NEUTROABS  --   --   --  PENDING  HGB 10.9* 10.8* 10.4* 10.0*  HCT 35.6* 36.2 35.0* 32.9*  MCV 85.8 87.4 88.4 86.6  PLT 278 230 231 216    Cardiac Enzymes: Recent Labs  Lab 11/18/18 1722 11/18/18 2255 11/19/18 0407 11/19/18 1012 11/19/18 1652  TROPONINI 1.44* 3.84* 7.68* 8.26* 5.09*    BNP: Invalid input(s): POCBNP  CBG: Recent Labs  Lab 11/21/18 0013 11/21/18 0741 11/21/18 1148 11/21/18 2050 11/22/18 0734  GLUCAP 119* 102* 175* 123* 15    Microbiology: Results for orders placed or performed during the hospital encounter of 11/18/18  MRSA PCR Screening     Status: None   Collection Time: 11/18/18 10:32 PM  Result Value Ref Range Status   MRSA by PCR NEGATIVE NEGATIVE Final    Comment:        The GeneXpert MRSA Assay (FDA approved for NASAL specimens only), is one component of a comprehensive MRSA colonization surveillance program. It is not intended to diagnose MRSA infection nor to guide or monitor treatment for MRSA infections. Performed at [facility], [address] [place], [place] [address]     Coagulation Studies: No results for input(s): LABPROT, INR in the last 72 hours.  Urinalysis: No results for input(s): COLORURINE, LABSPEC, PHURINE, GLUCOSEU, HGBUR, BILIRUBINUR, KETONESUR, PROTEINUR, UROBILINOGEN, NITRITE, LEUKOCYTESUR in the last 72 hours.  Invalid input(s): APPERANCEUR    Imaging: Dg Chest Port 1 View  Result Date: 11/21/2018 CLINICAL DATA:  Respiratory failure. EXAM: PORTABLE CHEST 1 VIEW COMPARISON:  11/18/2018. FINDINGS: The heart is enlarged. AICD device unchanged. Prior CABG. Mild vascular congestion. Stable dialysis catheter. No consolidation or frank edema. IMPRESSION: Stable exam.  No  acute findings. Electronically Signed   By: Staci Righter M.D.   On: 11/21/2018 08:19     Medications:   . sodium chloride     . amitriptyline  25 mg Oral QHS  . apixaban  5 mg Oral BID  . aspirin EC  81 mg Oral Daily  . atorvastatin  40 mg Oral q1800  . Chlorhexidine Gluconate Cloth  6 each Topical Q0600  . clopidogrel  75 mg Oral Q breakfast  . gabapentin  100 mg Oral BID  . insulin aspart  0-5 Units Subcutaneous QHS  . insulin aspart  0-9 Units Subcutaneous TID WC  . insulin glargine  15 Units Subcutaneous BID  . iron polysaccharides  150 mg Oral Daily  . isosorbide mononitrate  30 mg Oral Daily  . metoprolol tartrate  12.5 mg Oral BID  . pantoprazole  40 mg Oral QHS  . sertraline  100 mg Oral QHS  . sodium chloride flush  3 mL Intravenous Once  . sodium chloride flush  3 mL Intravenous Q12H   sodium chloride, acetaminophen, ALPRAZolam, guaiFENesin-dextromethorphan, hydrOXYzine, nitroGLYCERIN, ondansetron (ZOFRAN) IV, sodium chloride flush, zolpidem  Assessment/ Plan:  [name] is a 73 y.o. Code female with atrial fibrillation, hypertension, diabetes mellitus type II insulin dependent, diabetic neuropathy, congestive heart failure, coronary artery disease status post CABG, GERD, CLL, bilateral below the knee amputations admitted with cough, shortness of breath, UTI and initiated on hemodialysis this admission.   1.  End Stage Renal Disease with volume overload  Ultrafiltration for volume optimization.  Patient has clinically improved today We will start patient on peritoneal dialysis tonight and try to remove volume with PD  2.  Anemia of chronic kidney disease with CLL and leukocytosis, Thrombocytopenia   Current hemoglobin 10.0 -Follow closely -Consider starting Procrit as outpatient in collaboration with hematology  3. Diabetes mellitus type II with chronic kidney disease: insulin dependent. history of poor control.   4.  Cardiac history: Atrial  fibrillation:  -Amiodarone discontinued due to bradycardia Chronic systolic CHF with LVEF 35 to 40%, severe hypokinesis of septal, anteroseptal and anterior left ventricular segments, mildly dilated left atrium, moderate aortic sclerosis, mildly elevated right ventricular systolic pressure of 88.8 mm.  Last echocardiogram October 11, 2018 -Non-STEMI November 18, 2018; cath November 19, 2017- DES placed SVG to OM1     LOS: [place] 3/13/202010:09 AM

## 2018-11-22 NOTE — Telephone Encounter (Signed)
Might leave hospital this weekend

## 2018-11-22 NOTE — Progress Notes (Signed)
Progress Note  Patient Name: Caroline Hernandez Date of Encounter: 11/22/2018  Primary Cardiologist: Ida Rogue, MD   Subjective   She feels very fatigued and "beat" today. She does report some SOB, improved by [place] O2. She states that she has O2 at home to use at night on a PRN basis and never really has had to use it before in the past; therefore, she is concerned that she needs [place] oxygen at this time.   She denies chest pain, palpitations, feelings of racing HR.   Seen by nephrology as well with tentative plan for PD this evening (already has the port). Patient stated she is aware and agreeable to this plan.   Inpatient Medications    Scheduled Meds: . amitriptyline  25 mg Oral QHS  . amLODipine  10 mg Oral Daily  . apixaban  5 mg Oral BID  . aspirin EC  81 mg Oral Daily  . atorvastatin  40 mg Oral q1800  . Chlorhexidine Gluconate Cloth  6 each Topical Q0600  . clopidogrel  75 mg Oral Q breakfast  . gabapentin  100 mg Oral BID  . insulin aspart  0-5 Units Subcutaneous QHS  . insulin aspart  0-9 Units Subcutaneous TID WC  . insulin glargine  15 Units Subcutaneous BID  . iron polysaccharides  150 mg Oral Daily  . isosorbide mononitrate  30 mg Oral Daily  . metoprolol tartrate  12.5 mg Oral BID  . pantoprazole  40 mg Oral QHS  . sertraline  100 mg Oral QHS  . sodium chloride flush  3 mL Intravenous Once  . sodium chloride flush  3 mL Intravenous Q12H   Continuous Infusions: . sodium chloride     PRN Meds: sodium chloride, acetaminophen, ALPRAZolam, guaiFENesin-dextromethorphan, hydrOXYzine, nitroGLYCERIN, ondansetron (ZOFRAN) IV, sodium chloride flush, zolpidem   Vital Signs    Vitals:   11/21/18 2300 11/22/18 0000 11/22/18 0100 11/22/18 0208  BP:    118/65  Pulse: 66 67 71 72  Resp: 19 20 19 18   Temp:    98.3 F (36.8 C)  TempSrc:    Oral  SpO2: 96% 97% 96% 95%  Weight:      Height:        Intake/Output Summary (Last 24 hours) at 11/22/2018 0848 Last data  filed at 11/21/2018 2045 Gross per 24 hour  Intake 1083 ml  Output 2070 ml  Net -987 ml   Filed Weights   11/20/18 2115 11/21/18 1545 11/21/18 1830  Weight: 99.9 kg 99.9 kg 97.6 kg    Telemetry    Coarse Afib, ventricular rates in 60s  - Personally Reviewed  ECG    No recent tracings from today. Yesterday 11/21/18 EKG shows Afib, LAD, LAFB, anterolateral TWI  - Personally Reviewed  Physical Exam   GEN: No acute distress.   Neck: JVD difficult to assess d/t body habitus Cardiac: IRIR. no murmurs, rubs, or gallops. R femoral cath site without signs of infection, bleeding, bruising, hematoma.  Respiratory: [place], faint bibasilar crackles, wheezing heard throughout the lung fields. GI: Obese, nontender, non-distended  MS: Bilateral BKAs Neuro:  Nonfocal  Psych: Normal affect   Labs    Chemistry Recent Labs  Lab 11/18/18 1722 11/20/18 0511 11/21/18 0554  NA 135 133* 133*  K 4.7 4.7 5.0  CL 96* 97* 96*  CO2 26 26 23   GLUCOSE 126* 144* 106*  BUN 29* 24* 43*  CREATININE 3.13* 2.61* 4.04*  CALCIUM 9.4 8.3* 8.2*  GFRNONAA 14* 18*  11*  GFRAA 17* 21* 12*  ANIONGAP 13 10 14      Hematology Recent Labs  Lab 11/18/18 1722 11/20/18 0511 11/21/18 0554  WBC 46.1* 43.8* 43.1*  RBC 4.15 4.14 3.96  HGB 10.9* 10.8* 10.4*  HCT 35.6* 36.2 35.0*  MCV 85.8 87.4 88.4  MCH 26.3 26.1 26.3  MCHC 30.6 29.8* 29.7*  RDW 19.9* 20.3* 20.2*  PLT 278 230 231    Cardiac Enzymes Recent Labs  Lab 11/18/18 2255 11/19/18 0407 11/19/18 1012 11/19/18 1652  TROPONINI 3.84* 7.68* 8.26* 5.09*   No results for input(s): TROPIPOC in the last 168 hours.   BNPNo results for input(s): BNP, PROBNP in the last 168 hours.   DDimer No results for input(s): DDIMER in the last 168 hours.   Radiology    Dg Chest Port 1 View  Result Date: 11/21/2018 CLINICAL DATA:  Respiratory failure. EXAM: PORTABLE CHEST 1 VIEW COMPARISON:  11/18/2018. FINDINGS: The heart is enlarged. AICD device unchanged.  Prior CABG. Mild vascular congestion. Stable dialysis catheter. No consolidation or frank edema. IMPRESSION: Stable exam.  No acute findings. Electronically Signed   By: Staci Righter M.D.   On: 11/21/2018 08:19    Cardiac Studies   2D Echocardiogram 1.2020  1. The left ventricle has moderately reduced systolic function of 23-76%. The cavity size is mildly increased. There is no left ventricular wall thickness. The left ventricular diastology could not be evaluated due to nondiagnostic images. 2. There is severe hypokinesis of the entire septal, anteroseptal and anterior left ventricular segments. 3. Mildly dilated left atrial size. 4. The mitral valve normal in structure. Regurgitation is mild by color flow Doppler. 5. Normal tricuspid valve. 6. Tricuspid regurgitation mild-moderate. 7. The aortic valve normal. There is moderate sclerosis of the aortic valve. 8. The right ventricle is mildly enlarged in size. There is normal systolic function. Right ventricular systolic pressure is mildly elevated with an estimated pressure of 42.4 mmHg. _____________  Cardiac Catheterization and Percutaneous Coronary Intervention 3.11.2020  Coronary angiography:  Coronary dominance: Right  Left mainstem: Large vessel that bifurcates into the LAD and left circumflex, moderate diffuse disease  Left anterior descending (LAD): Moderate-sized vessel , occluded proximally  LIMA to the mid LAD is patent . LAD beyond the LIMA graft is small and diffusely diseased . Diagonal vessels small and diffusely diseased . Proximal to mid LAD fills by retrograde flow from LIMA   Left circumflex: Essentially occluded in the proximal region, very small vessel mid to distal. High OM appears to have proximal stent followed by CTO  Right coronary artery (RCA): Native RCA presumed to be occluded from proximal to mid region Vein graft to the RCA patent, large vessel, 90% disease mid PDA  SVG to the RCA/PDA: Patent,  no significant disease Saphenous vein graft to OM, 99% disease at the anastomosis, distal edge of the stent           **The obtuse marginal successfully stented using a 2.5 x 12 mm Resolute Onyx drug-eluting stent.**  Left ventriculography: Aortic valve crossed for pressures, no significant aortic valve stenosis, left ventricular end-diastolic pressure 27   Patient Profile     73 y.o. female with a h/o CAD, HTN, HLD, ICM, chronic combined systolic and diastolic heart failure, PAD s/p b/l BKA's, end stage renal disease, and persistent atrial fibrillation, who was admitted with NSTEMI and now s/p PCI and DES placement to obtuse marginal at the anastomosis of the vein graft.  Assessment & Plan  NSTEMI/CAD: H/o bypass. Troponin greater than 7; s/p catheterization and PCI. Echo as above. Dyspnea during procedure, requiring urgent HD. Remains on [place] with patient reporting home night O2, which she has never had to use before in the past. Continue ASA 81mg  , plavix, BB, nitrates, statin, and Eliquis. Recommendation to hold ASA after 30 days. Consider escalation of statin as tolerated. Discontinued CCB / amlodipine this AM given reduced EF. Recommend escalate BB if needed for additional BP support over adding in a CCB. Consider Entresto as below for reduced EF or Losartan. Ordered AM BMET and CBC as no labs this AM. Pending updated labs.  Persistent Atrial Fibrillation: Continue rate control with BB. HR today controlled with rates in high 60s to low 70s. Eliquis resumed s/p catheterization and PCI. As above, pending updated labs including CBC this AM.  ICM: EF 35-40%. Volume management per nephrology as on HD with tentative plan to transition to PD tonight. Faint bibasilar crackles with wheezing throughout, still on O2. Consider BNP to further assess volume status. Pending labs as above. Continue BB. As previously noted, consider Entresto if okay with nephrology given reduced EF. Recommend recheck echo for  reassessment of EF s/p stenting at a later date.  PAD: S/p b/l BKA. Uses prostheses. Continue statin.  End stage renal disease: Pending updated BMET, ordered this AM. HD, per nephrology. Discussed volume status with nephrology today, and the tentative plan is to get started on PD tonight.   Normocytic anemia: Pending updated CBC, ordered this AM.   For questions or updates, please contact [place] Please consult [email or web address] for contact info under        Signed, Arvil Chaco, PA-C  11/22/2018, 8:48 AM

## 2018-11-23 ENCOUNTER — Other Ambulatory Visit: Payer: Self-pay | Admitting: Cardiovascular Disease

## 2018-11-23 LAB — CBC WITH DIFFERENTIAL/PLATELET
Abs Immature Granulocytes: 0.1 10*3/uL — ABNORMAL HIGH (ref 0.00–0.07)
BASOS PCT: 0 %
Basophils Absolute: 0.1 10*3/uL (ref 0.0–0.1)
Eosinophils Absolute: 0.2 10*3/uL (ref 0.0–0.5)
Eosinophils Relative: 1 %
HCT: 32.7 % — ABNORMAL LOW (ref 36.0–46.0)
Hemoglobin: 9.8 g/dL — ABNORMAL LOW (ref 12.0–15.0)
Immature Granulocytes: 0 %
Lymphocytes Relative: 81 %
Lymphs Abs: 25.8 10*3/uL — ABNORMAL HIGH (ref 0.7–4.0)
MCH: 26.3 pg (ref 26.0–34.0)
MCHC: 30 g/dL (ref 30.0–36.0)
MCV: 87.7 fL (ref 80.0–100.0)
Monocytes Absolute: 1 10*3/uL (ref 0.1–1.0)
Monocytes Relative: 3 %
NRBC: 0 % (ref 0.0–0.2)
Neutro Abs: 4.8 10*3/uL (ref 1.7–7.7)
Neutrophils Relative %: 15 %
Platelets: 209 10*3/uL (ref 150–400)
RBC: 3.73 MIL/uL — ABNORMAL LOW (ref 3.87–5.11)
RDW: 20.4 % — ABNORMAL HIGH (ref 11.5–15.5)
Smear Review: NORMAL
WBC: 32 10*3/uL — ABNORMAL HIGH (ref 4.0–10.5)

## 2018-11-23 LAB — GLUCOSE, CAPILLARY
Glucose-Capillary: 110 mg/dL — ABNORMAL HIGH (ref 70–99)
Glucose-Capillary: 116 mg/dL — ABNORMAL HIGH (ref 70–99)
Glucose-Capillary: 131 mg/dL — ABNORMAL HIGH (ref 70–99)

## 2018-11-23 LAB — BASIC METABOLIC PANEL
Anion gap: 12 (ref 5–15)
BUN: 44 mg/dL — AB (ref 8–23)
CALCIUM: 8.1 mg/dL — AB (ref 8.9–10.3)
CO2: 23 mmol/L (ref 22–32)
Chloride: 96 mmol/L — ABNORMAL LOW (ref 98–111)
Creatinine, Ser: 5.51 mg/dL — ABNORMAL HIGH (ref 0.44–1.00)
GFR calc Af Amer: 8 mL/min — ABNORMAL LOW (ref >60)
GFR calc non Af Amer: 7 mL/min — ABNORMAL LOW (ref >60)
Glucose, Bld: 130 mg/dL — ABNORMAL HIGH (ref 70–99)
Potassium: 4.5 mmol/L (ref 3.5–5.1)
Sodium: 131 mmol/L — ABNORMAL LOW (ref 135–145)

## 2018-11-23 MED ORDER — ASPIRIN 81 MG PO TBEC: 81.0000 mg | DELAYED_RELEASE_TABLET | Freq: Every day | ORAL | 0 refills | Status: AC

## 2018-11-23 MED ORDER — CLOPIDOGREL BISULFATE 75 MG PO TABS: 75.0000 mg | ORAL_TABLET | Freq: Every day | ORAL | 0 refills | Status: DC

## 2018-11-23 MED ORDER — NITROGLYCERIN 0.4 MG SL SUBL: 0.4000 mg | SUBLINGUAL_TABLET | SUBLINGUAL | 0 refills | Status: AC | PRN

## 2018-11-23 MED ORDER — ISOSORBIDE MONONITRATE ER 30 MG PO TB24: 30.0000 mg | ORAL_TABLET | Freq: Every day | ORAL | 0 refills | Status: DC

## 2018-11-23 MED ORDER — METOPROLOL TARTRATE 25 MG PO TABS: 12.5000 mg | ORAL_TABLET | Freq: Two times a day (BID) | ORAL | 0 refills | Status: DC

## 2018-11-23 NOTE — Progress Notes (Signed)
Pre HD assessment    11/23/18 1317  Vital Signs  Temp 98 F (36.7 C)  Temp Source Oral  Pulse Rate 66  Pulse Rate Source Monitor  Resp 15  BP 122/64  BP Location Left Arm  BP Method Automatic  Patient Position (if appropriate) Lying  Oxygen Therapy  SpO2 100 %  O2 Device Nasal Cannula  O2 Flow Rate (L/min) 3 L/min  Pain Assessment  Pain Scale 0-10  Pain Score 0  Dialysis Weight  Weight 104.4 kg  Type of Weight Pre-Dialysis  Time-Out for Hemodialysis  What Procedure? HD  Pt Identifiers(min of two) First/Last Name;MRN/Account#  Correct Site? Yes  Correct Side? Yes  Correct Procedure? Yes  Consents Verified? Yes  Rad Studies Available? N/A  Safety Precautions Reviewed? Yes  Engineer, civil (consulting) Number  ([id number])  Station Number 2  UF/Alarm Test Passed  Conductivity: Meter 13.8  Conductivity: Machine  13.7  pH 7.2  Reverse Osmosis main  Normal Saline Lot Number [id number]  Dialyzer Lot Number [id number]  Disposable Set Lot Number [id number]  Machine Temperature 98.6 F (37 C)  Musician and Audible Yes  Blood Lines Intact and Secured Yes  Pre Treatment Patient Checks  Vascular access used during treatment Catheter  Hepatitis B Surface Antigen Results Negative  Date Hepatitis B Surface Antigen Drawn 11/20/18  Hepatitis B Surface Antibody  (<10)  Date Hepatitis B Surface Antibody Drawn 10/22/18  Hemodialysis Consent Verified Yes  Hemodialysis Standing Orders Initiated Yes  ECG (Telemetry) Monitor On Yes  Prime Ordered Normal Saline  Length of  DialysisTreatment -hour(s) 3.5 Hour(s)  Dialyzer Elisio 17H NR  Dialysate 2K, 2.5 Ca  Dialysis Anticoagulant None  Dialysate Flow Ordered 600  Blood Flow Rate Ordered 300 mL/min  Ultrafiltration Goal 1.5 Liters  Dialysis Blood Pressure Support Ordered Normal Saline  Education / Care Plan  Dialysis Education Provided Yes  Documented Education in Care Plan Yes  Hemodialysis Catheter  Placement Date: 10/21/18    Site  Condition No complications  Dressing Type Biopatch  Dressing Status Clean;Dry;Intact  Drainage Description None

## 2018-11-23 NOTE — Progress Notes (Signed)
HD tx end    11/23/18 1652  Vital Signs  Pulse Rate 64  Pulse Rate Source Monitor  Resp 14  BP 136/77  BP Location Left Arm  BP Method Automatic  Patient Position (if appropriate) Lying  Oxygen Therapy  SpO2 100 %  O2 Device Nasal Cannula  O2 Flow Rate (L/min) 3 L/min  During Hemodialysis Assessment  Dialysis Fluid Bolus Normal Saline  Bolus Amount (mL) 250 mL  Intra-Hemodialysis Comments Tx completed

## 2018-11-23 NOTE — Progress Notes (Signed)
Pre HD assessment    11/23/18 1318  Neurological  Level of Consciousness Alert  Orientation Level Oriented X4  Respiratory  Respiratory Pattern Regular;Unlabored  Chest Assessment Chest expansion symmetrical  Cardiac  Pulse Irregular  ECG Monitor Yes  Cardiac Rhythm Atrial fibrillation  Antiarrhythmic device Yes  Vascular  R Radial Pulse +2  L Radial Pulse +2  Edema Generalized  Integumentary  Integumentary (WDL) X  Skin Color Appropriate for ethnicity  Musculoskeletal  Musculoskeletal (WDL) X  Generalized Weakness Yes  Assistive Device None  GU Assessment  Genitourinary (WDL) X  Genitourinary Symptoms  (HD)  Psychosocial  Psychosocial (WDL) WDL

## 2018-11-23 NOTE — Progress Notes (Signed)
HD tx start    11/23/18 1321  Vital Signs  Pulse Rate 63  Pulse Rate Source Monitor  Resp 15  BP 124/75  BP Location Left Arm  BP Method Automatic  Patient Position (if appropriate) Lying  Oxygen Therapy  SpO2 100 %  O2 Device Nasal Cannula  O2 Flow Rate (L/min) 3 L/min  During Hemodialysis Assessment  Blood Flow Rate (mL/min) 300 mL/min  Arterial Pressure (mmHg) -150 mmHg  Venous Pressure (mmHg) 100 mmHg  Transmembrane Pressure (mmHg) 60 mmHg  Ultrafiltration Rate (mL/min) 570 mL/min  Dialysate Flow Rate (mL/min) 600 ml/min  Conductivity: Machine  13.7  HD Safety Checks Performed Yes  Dialysis Fluid Bolus Normal Saline  Bolus Amount (mL) 250 mL  Intra-Hemodialysis Comments Tx initiated  Hemodialysis Catheter  Placement Date: 10/21/18    Blue Lumen Status Infusing  Red Lumen Status Infusing

## 2018-11-23 NOTE — Plan of Care (Signed)
  Problem: Pain Managment: Goal: General experience of comfort will improve Outcome: Progressing   Problem: Safety: Goal: Ability to remain free from injury will improve Outcome: Progressing   

## 2018-11-23 NOTE — Progress Notes (Signed)
[facility]  ROUNDING NOTE   Subjective:   Patient known to our practice from outpatient dialysis as well as previous admissions.  This time she presents for chest discomfort and right shoulder pain.  Found to have elevated troponins.  Total of 6000 cc of fluid removed with hemodialysis   Otherwise feels well Denies any acute SOB, nausea or vomting  Objective:  Vital signs in last 24 hours:  Temp:  [97.6 F (36.4 C)-98.4 F (36.9 C)] 97.6 F (36.4 C) (03/14 0816) Pulse Rate:  [60-73] 67 (03/14 0816) Resp:  [16-25] 16 (03/14 0816) BP: (108-137)/(57-81) 135/68 (03/14 0816) SpO2:  [94 %-100 %] 97 % (03/14 0816) Weight:  [101.9 kg] 101.9 kg (03/14 0450)  Weight change: 1.978 kg Filed Weights   11/21/18 1545 11/21/18 1830 11/23/18 0450  Weight: 99.9 kg 97.6 kg 101.9 kg    Intake/Output: I/O last 3 completed shifts: In: 1080 [P.O.:1080] Out: -    Intake/Output this shift:  No intake/output data recorded.  Physical Exam: General: No acute distress  Head: Normocephalic, atraumatic. Moist oral mucosal membranes  Eyes: Anicteric  Neck: Supple,  Lungs:   clear today, normal effort, oxygen by nasal cannula  Heart:  Irregular rhythm 2/6 murmur,  Abdomen:  Soft, PD catheter in place  Extremities: B/L BKA  Neurologic: Awake, alert, following commands  Skin: No lesions  Access:  RIJ permcath 2/12 Dr. Delana Meyer, PD catheter Dr. Delana Meyer 1/93    Basic Metabolic Panel: Recent Labs  Lab 11/18/18 1722 11/18/18 2255 11/20/18 0511 11/21/18 0554 11/22/18 0954 11/23/18 0422  NA 135  --  133* 133* 132* 131*  K 4.7  --  4.7 5.0 4.2 4.5  CL 96*  --  97* 96* 95* 96*  CO2 26  --  26 23 25 23   GLUCOSE 126*  --  144* 106* 94 130*  BUN 29*  --  24* 43* 36* 44*  CREATININE 3.13*  --  2.61* 4.04* 4.46* 5.51*  CALCIUM 9.4  --  8.3* 8.2* 8.3* 8.1*  MG  --  2.0  --   --   --   --     Liver Function Tests: No results for input(s): AST, ALT, ALKPHOS, BILITOT, PROT, ALBUMIN  in the last 168 hours. No results for input(s): LIPASE, AMYLASE in the last 168 hours. No results for input(s): AMMONIA in the last 168 hours.  CBC: Recent Labs  Lab 11/18/18 1722 11/20/18 0511 11/21/18 0554 11/22/18 0954 11/23/18 0422  WBC 46.1* 43.8* 43.1* 31.4* 32.0*  NEUTROABS  --   --   --  5.8 4.8  HGB 10.9* 10.8* 10.4* 10.0* 9.8*  HCT 35.6* 36.2 35.0* 32.9* 32.7*  MCV 85.8 87.4 88.4 86.6 87.7  PLT 278 230 231 216 209    Cardiac Enzymes: Recent Labs  Lab 11/18/18 1722 11/18/18 2255 11/19/18 0407 11/19/18 1012 11/19/18 1652  TROPONINI 1.44* 3.84* 7.68* 8.26* 5.09*    BNP: Invalid input(s): POCBNP  CBG: Recent Labs  Lab 11/22/18 0734 11/22/18 1232 11/22/18 1550 11/22/18 2107 11/23/18 0818  GLUCAP 80 113* 166* 152* 110*    Microbiology: Results for orders placed or performed during the hospital encounter of 11/18/18  MRSA PCR Screening     Status: None   Collection Time: 11/18/18 10:32 PM  Result Value Ref Range Status   MRSA by PCR NEGATIVE NEGATIVE Final    Comment:        The GeneXpert MRSA Assay (FDA approved for NASAL specimens only), is one  component of a comprehensive MRSA colonization surveillance program. It is not intended to diagnose MRSA infection nor to guide or monitor treatment for MRSA infections. Performed at [facility], [address] [place], [place] [address]     Coagulation Studies: No results for input(s): LABPROT, INR in the last 72 hours.  Urinalysis: No results for input(s): COLORURINE, LABSPEC, PHURINE, GLUCOSEU, HGBUR, BILIRUBINUR, KETONESUR, PROTEINUR, UROBILINOGEN, NITRITE, LEUKOCYTESUR in the last 72 hours.  Invalid input(s): APPERANCEUR    Imaging: No results found.   Medications:   . sodium chloride     . amitriptyline  25 mg Oral QHS  . apixaban  5 mg Oral BID  . aspirin EC  81 mg Oral Daily  . atorvastatin  40 mg Oral q1800  . Chlorhexidine Gluconate Cloth  6 each Topical Q0600  .  clopidogrel  75 mg Oral Q breakfast  . gabapentin  100 mg Oral BID  . insulin aspart  0-5 Units Subcutaneous QHS  . insulin aspart  0-9 Units Subcutaneous TID WC  . insulin glargine  15 Units Subcutaneous BID  . iron polysaccharides  150 mg Oral Daily  . isosorbide mononitrate  30 mg Oral Daily  . metoprolol tartrate  12.5 mg Oral BID  . pantoprazole  40 mg Oral QHS  . sertraline  100 mg Oral QHS  . sodium chloride flush  3 mL Intravenous Once  . sodium chloride flush  3 mL Intravenous Q12H   sodium chloride, acetaminophen, ALPRAZolam, guaiFENesin-dextromethorphan, hydrOXYzine, nitroGLYCERIN, ondansetron (ZOFRAN) IV, sodium chloride flush, zolpidem  Assessment/ Plan:  [name] is a 73 y.o. [place] female with atrial fibrillation, hypertension, diabetes mellitus type II insulin dependent, diabetic neuropathy, congestive heart failure, coronary artery disease status post CABG, GERD, CLL, bilateral below the knee amputations admitted with cough, shortness of breath, UTI and initiated on hemodialysis this admission.   1.  End Stage Renal Disease with volume overload  Ultrafiltration for volume optimization.  Patient has clinically improved today HD today  Patient may be discharged home. She will resume PD at home  2.  Anemia of chronic kidney disease with CLL and leukocytosis, Thrombocytopenia   Current hemoglobin 9.8 -Follow closely -Consider starting Procrit as outpatient in collaboration with hematology  3. Diabetes mellitus type II with chronic kidney disease: insulin dependent. history of poor control.   4.  Cardiac history: Atrial fibrillation:  -Amiodarone discontinued due to bradycardia Chronic systolic CHF with LVEF 35 to 40%, severe hypokinesis of septal, anteroseptal and anterior left ventricular segments, mildly dilated left atrium, moderate aortic sclerosis, mildly elevated right ventricular systolic pressure of 66.0 mm.  Last echocardiogram October 11, 2018 -Non-STEMI November 18, 2018; cath November 19, 2017- DES placed SVG to OM1     LOS: [place] 3/14/20209:46 AM

## 2018-11-23 NOTE — Progress Notes (Signed)
Post HD assessment    11/23/18 1658  Neurological  Level of Consciousness Alert  Orientation Level Oriented X4  Respiratory  Respiratory Pattern Regular;Unlabored  Chest Assessment Chest expansion symmetrical  Cardiac  Pulse Irregular  ECG Monitor Yes  Cardiac Rhythm SB;Atrial fibrillation  Vascular  R Radial Pulse +2  L Radial Pulse +2  Edema Generalized  Integumentary  Integumentary (WDL) X  Skin Color Appropriate for ethnicity  Musculoskeletal  Musculoskeletal (WDL) X  Generalized Weakness Yes  Assistive Device None  GU Assessment  Genitourinary (WDL) X  Genitourinary Symptoms  (HD)  Psychosocial  Psychosocial (WDL) WDL

## 2018-11-23 NOTE — Progress Notes (Signed)
Pt was transported via EMS. IV and telemetry was removed. Documents and belongings given to transport.

## 2018-11-23 NOTE — TOC Transition Note (Signed)
Transition of Care [facility]) - CM/SW Discharge Note   Patient Details  Name: Saja Bartolini MRN: [id number] Date of Birth: 1946-02-27  [facility] CM/SW Contact:  [name], RN Phone Number: 11/23/2018, 1:11 PM   Clinical Narrative:  Patient to be discharged per MD order. Orders in place for home health services. Patient is active with [facility] home [facility], notified Tommi Rumps from [name] of pending discharge. Patient also has O2 set up through [place]. Patient tells me she has all the necessary supplies. Patient is transitioning to PD. No DME needs.      Final next level of care: [place] Barriers to Discharge: No Barriers Identified   Patient Goals and CMS Choice Patient states their goals for this hospitalization and ongoing recovery are:: Patient thinks she may want to go to short term skilled nursing for rehab. CMS Medicare.gov Compare Post Acute Care list provided to:: Patient Choice offered to / list presented to : Patient  Discharge Placement                       Discharge Plan and Services Discharge Planning Services: CM Consult                HH Arranged: RN, PT, Nurse's Aide [place] Agency: [facility]   Social Determinants of Health (SDOH) Interventions     Readmission Risk Interventions No flowsheet data found.

## 2018-11-23 NOTE — Progress Notes (Signed)
Post HD assessment. PT tolerated tx well without c/o or complication. Net UF 1637, goal met.    11/23/18 1659  Vital Signs  Temp 97.8 F (36.6 C)  Temp Source Oral  Pulse Rate 63  Pulse Rate Source Monitor  Resp 14  BP 140/69  BP Location Left Arm  BP Method Automatic  Patient Position (if appropriate) Lying  Oxygen Therapy  SpO2 100 %  O2 Device Nasal Cannula  O2 Flow Rate (L/min) 3 L/min  Dialysis Weight  Weight 101.9 kg  Type of Weight Post-Dialysis  Post-Hemodialysis Assessment  Rinseback Volume (mL) 250 mL  KECN 58.2 V  Dialyzer Clearance Lightly streaked  Duration of HD Treatment -hour(s) 3.5 hour(s)  Hemodialysis Intake (mL) 500 mL  UF Total -Machine (mL) 2137 mL  Net UF (mL) 1637 mL  Tolerated HD Treatment Yes  Education / Care Plan  Dialysis Education Provided Yes  Documented Education in Care Plan Yes  Hemodialysis Catheter  Placement Date: 10/21/18    Site Condition No complications  Blue Lumen Status Heparin locked  Red Lumen Status Heparin locked  Purple Lumen Status N/A  Catheter fill solution Heparin 1000 units/ml  Catheter fill volume (Arterial) 1.6 cc  Catheter fill volume (Venous) 1.6  Dressing Type Biopatch  Dressing Status Clean;Dry;Intact  Drainage Description None  Post treatment catheter status Capped and Clamped

## 2018-11-23 NOTE — Discharge Instructions (Signed)
Continue taking your statin (cholesterol medication), and hypertension medications Norvasc, Imdur, and metoprolol.   Start taking aspirin 81 mg daily. Take this for 30 days then stop.   Start taking Plavix 75 mg daily and continue to do so.  Resume taking Eliquis (apixaban) 5 mg twice daily.  Follow-up within 5 days with your primary care doctor.  Follow-up within 2 weeks with the cardiologist who saw you in the hospital. Phone numbers have been provided for you.  A referral has been made for [facility] outpatient cardiopulmonary rehab. Please follow-up with them to be seen.

## 2018-11-23 NOTE — Discharge Summary (Signed)
Caroline Hernandez at [facility] NAME: Caroline Hernandez    MR#:  [id number]  DATE OF BIRTH:  07/26/1946  DATE OF ADMISSION:  11/18/2018   ADMITTING PHYSICIAN: Gladstone Lighter, MD  DATE OF DISCHARGE: 11/23/18  PRIMARY CARE PHYSICIAN: Apolonio Schneiders, MD   ADMISSION DIAGNOSIS:  Acute respiratory failure with hypoxia ([place]) [J96.01] Right upper quadrant abdominal pain [R10.11] Acute bronchitis, unspecified organism [J20.9] DISCHARGE DIAGNOSIS:  Active Problems:   NSTEMI (non-ST elevated myocardial infarction) ([place])  SECONDARY DIAGNOSIS:   Past Medical History:  Diagnosis Date  . Amputation of left lower extremity below knee ([place])   . Amputation of right lower extremity below knee ([place])   . CAD (coronary artery disease)    a. 2004 Cardiac Arrest/CABG x 3 (LIMA->LAD, VG->OM, VG->RCA);  b. 06/2015 lexiscan MV: no significant ischemia, EF 48%, low risk->Med Rx.  . Chronic combined systolic and diastolic CHF (congestive heart failure) ([place])    a. 12/2014 Echo: EF 45-50%;  b. 08/2015 Echo: EF 45-50%, ant, antsept HK, mildly dil LA, nl RV, mild-mod TR, sev PAH (58mHg); b. 08/2017 TEE: EF 40-45%, large PFO w/ L->R shunting.  . CKD (chronic kidney disease), stage IV ([place]   . CLL (chronic lymphocytic leukemia) ([place]   . Diabetes mellitus without complication ([place]   . Essential hypertension   . GERD (gastroesophageal reflux disease)   . Hiatal hernia   . History of cardiac arrest    a. 2004.  .Marland KitchenHyperlipidemia   . Ischemic cardiomyopathy    a. s/p MDT ICD (originally had 60865lead-->gen change and lead revision ~ 2012 @ [place]; b. 12/2014 Echo: EF 45-50%;  c.08/2015 Echo: EF 45-50%; d. 08/2017 Echo: EF 40-45%.  .Marland KitchenPAD (peripheral artery disease) (H[facility])    a. s/p bilat BKA  . Persistent atrial fibrillation    a. Dx 12/2014.  CHA2DS2VASc = 6--> warfarin;  b. 09/2015 s/p DCCV-->on amio; c. s/p DCCV 04/25/16; d. 07/2016 s/p RFCA/PVI in setting of recurrent afib  despite amio; e. 05/2018 Recurrent afib.   HOSPITAL COURSE:   73year old female with multiple past medical history significant for history of CAD status post CABG, CLL, diabetes, PAD status post bilateral BKA, chronic diastolic heart failure, anemia, end-stage renal disease on dialysis, atrial fibrillation status post ablation and pacer/ICD on Eliquis comes to hospital secondary to chest pain.  1. NSTEMI-known significant cardiac history - Appreciate cardiology consult.  Was started on heparin drip-now off after cardiac cath -Troponin elevated, status post cardiac cath revealing patent grafts but severe disease of the vein graft to obtuse marginal and status post PCI - on aspirin and Plavix.  Continue statin, beta-blocker. - Aspirin can be stopped after 30 days.  At that time patient will continue her Plavix and Eliquis -Last echocardiogram with EF of 35 to 40% -Continue cardiac medications. -was on oxygen 5-6L which is acute, continue to wean as tolerated-incentive spirometry given.  Significant global weakness noted.  Also requiring dialysis per pulmonary edema. On discharge was down to home 2-3 L O2.  -Cardiac rehab after discharge, referral placed. [facility]   2.  End-stage renal disease on dialysis-patient initiated on hemodialysis pretty recently. -Finished training for home PD dialysis.  Consulted nephrology. -on hemodialysis here, receiving hemodialysis for the last 2 days, ultrafiltration again due to fluid overload and hypoxia. Tolerated well on the day of discharge and UF goal met.   3.  Atrial fibrillation-status post prior ablations, continue rate  control, on low-dose metoprolol -Was on heparin drip, resumed on Eliquis  4.  Leukocytosis--secondary to her CLL.  Monitor -Continue outpatient follow-up as scheduled  5.  Hypertension-on Norvasc, Imdur, metoprolol  6.  Diabetes mellitus-on Lantus and sliding scale insulin  7.  DVT prophylaxis-on Eliquis  again. Continue.   Also was seen by PT home health at discharge: [facility] aide, RN, PT, home O2 ordered. -Patient has bilateral prostheses for the lower extremities  Follow up PCP within 1 week and follow up with [facility] cardiology within 2 weeks. Patient aware that she will need to make appointments and contact numbers given.   DISCHARGE CONDITIONS:  stable CONSULTS OBTAINED:  Treatment Team:  Murlean Iba, MD DRUG ALLERGIES:   Allergies  Allergen Reactions  . Piperacillin Other (See Comments)    Renal failure  . 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 See ID note**PER PT-CAUSED RENAL FAILURE**  . Zosyn [Piperacillin Sod-Tazobactam So] Other (See Comments)    Renal failure   DISCHARGE MEDICATIONS:   Allergies as of 11/23/2018      Reactions   Piperacillin Other (See Comments)   Renal failure   Piperacillin-tazobactam In Dex Other (See Comments)   Possible AIN in 2008??? See ID note**PER PT-CAUSED RENAL FAILURE**   Zosyn [piperacillin Sod-tazobactam So] Other (See Comments)   Renal failure      Medication List    STOP taking these medications   sulfamethoxazole-trimethoprim 800-160 MG tablet Commonly known as:  BACTRIM DS,SEPTRA DS     TAKE these medications   amitriptyline 25 MG tablet Commonly known as:  ELAVIL Take 25 mg by mouth at bedtime.   amLODipine 10 MG tablet Commonly known as:  NORVASC TAKE 1 TABLET(10 MG) BY MOUTH DAILY   apixaban 5 MG Tabs tablet Commonly known as:  Eliquis Take 1 tablet (5 mg total) by mouth 2 (two) times daily.   aspirin 81 MG EC tablet Take 1 tablet (81 mg total) by mouth daily for 30 days. Start taking on:  November 24, 2018   atorvastatin 40 MG tablet Commonly known as:  LIPITOR Take 40 mg by mouth at bedtime.   clopidogrel 75 MG tablet Commonly known as:  PLAVIX Take 1 tablet (75 mg total) by mouth daily with breakfast for 30 days. Start taking on:  November 24, 2018   gabapentin 100 MG  capsule Commonly known as:  NEURONTIN Take 100 mg by mouth 2 (two) times daily.   HM Vitamin D3 100 MCG (4000 UT) Caps Generic drug:  Cholecalciferol Take 4,000 Units by mouth daily.   insulin aspart 100 UNIT/ML injection Commonly known as:  novoLOG Inject 5-10 Units into the skin 3 (three) times daily before meals. 10 units into the skin before breakfast then 5 units before lunch then 10 units before supper (evening meal)   insulin glargine 100 UNIT/ML injection Commonly known as:  LANTUS Inject 0.15 mLs (15 Units total) into the skin 2 (two) times daily.   iron polysaccharides 150 MG capsule Commonly known as:  NIFEREX Take 150 mg by mouth daily.   isosorbide mononitrate 30 MG 24 hr tablet Commonly known as:  IMDUR Take 1 tablet (30 mg total) by mouth daily for 30 days. Start taking on:  November 24, 2018 What changed:  See the new instructions.   metoprolol tartrate 25 MG tablet Commonly known as:  LOPRESSOR Take 0.5 tablets (12.5 mg total) by mouth 2 (two) times daily for 30 days.  nitroGLYCERIN 0.4 MG SL tablet Commonly known as:  NITROSTAT Place 1 tablet (0.4 mg total) under the tongue every 5 (five) minutes x 3 doses as needed for chest pain. What changed:  when to take this   pantoprazole 40 MG tablet Commonly known as:  PROTONIX Take 40 mg by mouth at bedtime.   sertraline 100 MG tablet Commonly known as:  ZOLOFT Take 100 mg by mouth at bedtime.   torsemide 20 MG tablet Commonly known as:  DEMADEX Take 2 tablets (40 mg total) by mouth 2 (two) times daily.   vitamin C 500 MG tablet Commonly known as:  ASCORBIC ACID Take 500 mg by mouth daily.   Viteyes AREDS Formula/Lutein Caps Take 1 capsule by mouth 2 (two) times daily.            Durable Medical Equipment  (From admission, onward)         Start     Ordered   11/23/18 1015  For home use only DME oxygen  Once    Question Answer Comment  Mode or (Route) Nasal cannula   Liters per Minute 3    Oxygen delivery system Gas      11/23/18 1014           DISCHARGE INSTRUCTIONS:   DIET:  Cardiac diet DISCHARGE CONDITION:  Stable ACTIVITY:  Activity as tolerated OXYGEN:  Home Oxygen: Yes.    Oxygen Delivery: 2-3 liters/min via Patient connected to nasal cannula oxygen DISCHARGE LOCATION:  home with HH PT, RN, aide, Oxygen, [facility] cardiopulmonary rehab referral.   If you experience worsening of your admission symptoms, develop shortness of breath, life threatening emergency, suicidal or homicidal thoughts you must seek medical attention immediately by calling 911 or calling your MD immediately if your symptoms are severe.  You Must read complete instructions/literature along with all the possible adverse reactions/side effects for all the medicines you take and that have been prescribed to you. Take any new medicines only after you have completely understood and accept all the possible adverse reactions/side effects.   Please note  You were cared for by a hospitalist during your hospital stay. If you have any questions about your discharge medications or the care you received while you were in the hospital after you are discharged, you can call the unit and asked to speak with the hospitalist on call if the hospitalist that took care of you is not available. Once you are discharged, your primary care physician will handle any further medical issues. Please note that NO REFILLS for any discharge medications will be authorized once you are discharged, as it is imperative that you return to your primary care physician (or establish a relationship with a primary care physician if you do not have one) for your aftercare needs so that they can reassess your need for medications and monitor your lab values.    On the day of Discharge:  VITAL SIGNS:  Blood pressure 140/65, pulse 68, temperature 97.9 F (36.6 C), temperature source Oral, resp. rate 20, height 5' 8"  (1.727 m), weight 101.9  kg, SpO2 100 %. PHYSICAL EXAMINATION:  GENERAL:  73 y.o.-year-old patient lying in the bed with no acute distress.  EYES: Pupils equal, round, reactive to light and accommodation. No scleral icterus. Extraocular muscles intact.  HEENT: Head atraumatic, normocephalic. Oropharynx and nasopharynx clear.  NECK:  Supple, no jugular venous distention. No thyroid enlargement, no tenderness.  LUNGS: Normal breath sounds bilaterally, no wheezing, rales,rhonchi or crepitation. No use  of accessory muscles of respiration. CARDIOVASCULAR: S1, S2 normal. No   rubs, or gallops. 2/6 systolic murmur present ABDOMEN: Soft, nontender, nondistended. Abdominal PD catheter present. Bowel sounds present. No organomegaly or mass.  EXTREMITIES: s/p bilateral BKA Right chest permacath present NEUROLOGIC: Cranial nerves II through XII are intact. Muscle strength intact in all extremities. Sensation intact. Gait not checked. Global weakness noted PSYCHIATRIC: The patient is alert and oriented x 3.  SKIN: No other obvious rash, lesion, or ulcer.  DATA REVIEW:   CBC Recent Labs  Lab 11/23/18 0422  WBC 32.0*  HGB 9.8*  HCT 32.7*  PLT 209    Chemistries  Recent Labs  Lab 11/18/18 2255  11/23/18 0422  NA  --    < > 131*  K  --    < > 4.5  CL  --    < > 96*  CO2  --    < > 23  GLUCOSE  --    < > 130*  BUN  --    < > 44*  CREATININE  --    < > 5.51*  CALCIUM  --    < > 8.1*  MG 2.0  --   --    < > = values in this interval not displayed.     Microbiology Results  Results for orders placed or performed during the hospital encounter of 11/18/18  MRSA PCR Screening     Status: None   Collection Time: 11/18/18 10:32 PM  Result Value Ref Range Status   MRSA by PCR NEGATIVE NEGATIVE Final    Comment:        The GeneXpert MRSA Assay (FDA approved for NASAL specimens only), is one component of a comprehensive MRSA colonization surveillance program. It is not intended to diagnose MRSA infection nor to  guide or monitor treatment for MRSA infections. Performed at [facility], [address] [place], [place] [address]     RADIOLOGY:  No results found.   Management plans discussed with the patient and/or family and they are in agreement.  CODE STATUS: Full Code   TOTAL TIME TAKING CARE OF THIS PATIENT: 45 minutes.    Ripley Fraise PA-C on 11/23/2018 at 5:47 PM  Between 7am to 6pm - Pager - [phone]  After 6pm go to [email or web address] - Proofreader  [facility]  Office  [phone]  CC: Primary care physician; Apolonio Schneiders, MD

## 2018-11-25 ENCOUNTER — Other Ambulatory Visit: Payer: Self-pay | Admitting: *Deleted

## 2018-11-25 MED ORDER — TORSEMIDE 20 MG PO TABS: 40.0000 mg | ORAL_TABLET | Freq: Two times a day (BID) | ORAL | 0 refills | Status: DC

## 2018-11-26 ENCOUNTER — Encounter: Payer: Medicare HMO | Admitting: Internal Medicine

## 2018-11-27 NOTE — Telephone Encounter (Signed)
Cecille Rubin from [facility] home [facility] calling States that she was checking on orders from Dr Rockey Situ  They will resume PT OT nursing and home health aid  Please advise

## 2018-11-28 NOTE — Telephone Encounter (Signed)
After confirming with Dr Donivan Scull nurse, advised that patient's PCP to manage home health nursing orders. Lori verbalized understanding and will call PCP back for management.

## 2018-12-01 ENCOUNTER — Other Ambulatory Visit: Payer: Self-pay | Admitting: Internal Medicine

## 2018-12-05 ENCOUNTER — Telehealth: Payer: Self-pay | Admitting: Cardiovascular Disease

## 2018-12-05 NOTE — Telephone Encounter (Signed)
Left voicemail message to call back for review of appointment.

## 2018-12-05 NOTE — Telephone Encounter (Signed)
Patient requesting fu recent cath   Please advise

## 2018-12-06 NOTE — Telephone Encounter (Signed)
Left voicemail message to call back regarding this appointment.

## 2018-12-17 NOTE — Telephone Encounter (Signed)
Left message with patient's daughter to call back to discuss appointment. She is listed on patient's DPR.  Attempted to call patient's number, no answer and no voicemail.  Routing to scheduling.

## 2018-12-24 ENCOUNTER — Other Ambulatory Visit: Payer: Self-pay | Admitting: Cardiovascular Disease

## 2018-12-25 NOTE — Telephone Encounter (Signed)
Please advise if ok to refill Plavix. Pt was given Rx for 30 days @ last ED visit.

## 2018-12-26 ENCOUNTER — Telehealth: Payer: Self-pay | Admitting: Cardiovascular Disease

## 2018-12-26 NOTE — Telephone Encounter (Signed)
I spoke with Threasa Beards at [facility]. She states the patient picked up an eliqius RX in March and we refilled plavix today. She was asking if she needed to d/c the eliquis. I advised the patient was in the hospital in March with a NSTEMI and stent placement- so she needs plavix for this and her eliquis is for her A-fib.   Melanie voices understanding.

## 2018-12-26 NOTE — Telephone Encounter (Signed)
Pt c/o medication issue:  1. Name of Medication: Plavix vs eliquis   2. How are you currently taking this medication (dosage and times per day)?   3. Are you having a reaction (difficulty breathing--STAT)? No   4. What is your medication issue? Pharmacy calling to confirm correct blood thinner .  They have 2 rx .  Please call

## 2018-12-27 NOTE — Telephone Encounter (Signed)
Consent pending Mychart Response

## 2018-12-30 NOTE — Progress Notes (Signed)
Virtual Visit via Video Note   This visit type was conducted due to national recommendations for restrictions regarding the COVID-19 Pandemic (e.g. social distancing) in an effort to limit this patient's exposure and mitigate transmission in our community.  Due to her co-morbid illnesses, this patient is at least at moderate risk for complications without adequate follow up.  This format is felt to be most appropriate for this patient at this time.  All issues noted in this document were discussed and addressed.  A limited physical exam was performed with this format.  Please refer to the patient's chart for her consent to telehealth for [facility].   I connected with  Caroline Hernandez on 12/30/18 by a video enabled telemedicine application and verified that I am speaking with the correct person using two identifiers. I discussed the limitations of evaluation and management by telemedicine. The patient expressed understanding and agreed to proceed.   Evaluation Performed:  Follow-up visit  Date:  12/30/2018   ID:  Caroline Hernandez, DOB 26-Nov-1945, MRN [id number]  Patient Location:  [address] [place] [address]   Provider location:   [facility], [place] office  PCP:  Apolonio Schneiders, MD  Cardiologist:  Arvid Right [facility]   Chief Complaint:  Leg swelling, leg weakness   History of Present Illness:    Caroline Hernandez is a 73 y.o. female who presents via audio/video conferencing for a telehealth visit today.   The patient does not symptoms concerning for COVID-19 infection (fever, chills, cough, or new SHORTNESS OF BREATH).   Patient has a past medical history of coronary artery disease,  h/o bypass surgery in 2004,  CLL,   diabetes,  below the knee amputation b/l for PAD, chronic infections atrial fibrillation starting possibly in April 2016,  noted again June 2016,  acute on chronic diastolic CHF initially April 2016,   admitted to the hospital with  diastolic CHF/leg swelling,  abdominal bloating, angina,  She has bilateral prosthesis She has pacer ICD ablation for atrial fibrillation 07/18/2016 chronic kidney disease and her creatinine was 2.93 on 10/07/2018. High-grade anastomotic stenosis SVG to OM1 Successful PCI with DES at the anastomotic site SVG to OM1 who presents For routine follow-up of her atrial fibrillation, CAD  Doing dialysis, peritoneal Weight is 206, Weight was 224-230 before HD  125/60 Pulse 58 resp 16  Acute resp ditress 11/18/2018 Better now  In 2019: in the hospital with UTI Then back in the hospital with PNA Back into atrial fib,  E. Coli bacteremia 2/2 CAP- compounded by immunosuppression from CLL. Acute renal failure in chronic kidney disease stage IV  EF 40-45%.  ablation for atrial fibrillation 07/18/2016 10/09/2016:  Cardioversion  Hemoglobin A1c greater than 10, trying to watch her diet   TEE: 40% to 45% Mild to mod TR  07/18/2016: 1. Electrophysiology study and radiofrequency catheter ablation on 11/7/17by Dr Thompson Grayer. This study demonstrated  Successful electrical isolation and anatomical encircling of all four pulmonary veins with radiofrequency current.  Additional ablation along the posterior wall of the left atrium   Atrial fibrillation successfully cardioverted to sinus rhythm. Prolonged AV delay noted.  cardioversion at the end of January 2017 for persistent atrial fibrillation Maintaining normal sinus rhythm early February (amiodarone decreased down to 100 mg at that time), and in March when seen by Dr. Caryl Comes Reports having some shortness of breath, worsening leg swelling over the past month or 2 Weight has increased from low 180 pounds up  to 191 pounds at which time she increased Lasix up to 40 mg twice a day Mild improvement in her weight, down 4 pounds Still with significant fatigue, leg swelling, shortness of breath on exertion  History of coronary disease with  cardiac arrest in 2004, taken for three-vessel CABG, LIMA to the LAD, vein graft to the RCA, vein graft to an OM. No stenting or intervention since that time.  chronic infections of her legs leading to amputation. In hindsight family wonders if this could've been exacerbated by CLL.    Prior CV studies:   The following studies were reviewed today:  Cath 11/20/2018  1st Mrg lesion is 95% stenosed.  A drug-eluting stent was successfully placed using a STENT RESOLUTE ONYX 2.5X12.  Post intervention, there is a 0% residual stenosis.   1.  High-grade anastomotic stenosis SVG to OM1 2.  Successful PCI with DES at the anastomotic site SVG to OM1    Past Medical History:  Diagnosis Date  . Amputation of left lower extremity below knee ([place])   . Amputation of right lower extremity below knee ([place])   . CAD (coronary artery disease)    a. 2004 Cardiac Arrest/CABG x 3 (LIMA->LAD, VG->OM, VG->RCA);  b. 06/2015 lexiscan MV: no significant ischemia, EF 48%, low risk->Med Rx.  . Chronic combined systolic and diastolic CHF (congestive heart failure) ([place])    a. 12/2014 Echo: EF 45-50%;  b. 08/2015 Echo: EF 45-50%, ant, antsept HK, mildly dil LA, nl RV, mild-mod TR, sev PAH (62mmHg); b. 08/2017 TEE: EF 40-45%, large PFO w/ L->R shunting.  . CKD (chronic kidney disease), stage IV ([place])   . CLL (chronic lymphocytic leukemia) ([place])   . Diabetes mellitus without complication ([place])   . Essential hypertension   . GERD (gastroesophageal reflux disease)   . Hiatal hernia   . History of cardiac arrest    a. 2004.  Marland Kitchen Hyperlipidemia   . Ischemic cardiomyopathy    a. s/p MDT ICD (originally had 4128 lead-->gen change and lead revision ~ 2012 @ [place]); b. 12/2014 Echo: EF 45-50%;  c.08/2015 Echo: EF 45-50%; d. 08/2017 Echo: EF 40-45%.  Marland Kitchen PAD (peripheral artery disease) (HCC)    a. s/p bilat BKA  . Persistent atrial fibrillation    a. Dx 12/2014.  CHA2DS2VASc = 6--> warfarin;  b. 09/2015 s/p DCCV-->on amio; c.  s/p DCCV 04/25/16; d. 07/2016 s/p RFCA/PVI in setting of recurrent afib despite amio; e. 05/2018 Recurrent afib.   Past Surgical History:  Procedure Laterality Date  . ABDOMINAL HYSTERECTOMY    . Amputation lower extremity bilaterally Bilateral   . CAPD INSERTION N/A 10/25/2018   Procedure: LAPAROSCOPIC INSERTION CONTINUOUS AMBULATORY PERITONEAL DIALYSIS  (CAPD) CATHETER;  Surgeon: Katha Cabal, MD;  Location: [facility] ORS;  Service: Vascular;  Laterality: N/A;  . CARDIAC CATHETERIZATION    . CARDIOVERSION N/A 10/09/2016   Procedure: CARDIOVERSION;  Surgeon: Lelon Perla, MD;  Location: [facility] ENDOSCOPY;  Service: Cardiovascular;  Laterality: N/A;  . CORONARY ARTERY BYPASS GRAFT    . CORONARY STENT INTERVENTION N/A 11/20/2018   Procedure: CORONARY STENT INTERVENTION;  Surgeon: Isaias Cowman, MD;  Location: [place] CV LAB;  Service: Cardiovascular;  Laterality: N/A;  SVG- OM  . DIALYSIS/PERMA CATHETER INSERTION N/A 10/22/2018   Procedure: DIALYSIS/PERMA CATHETER INSERTION;  Surgeon: Katha Cabal, MD;  Location: [place] CV LAB;  Service: Cardiovascular;  Laterality: N/A;  . ELECTROPHYSIOLOGIC STUDY N/A 10/07/2015   Procedure: CARDIOVERSION;  Surgeon: Minna Merritts, MD;  Location: [facility] ORS;  Service: Cardiovascular;  Laterality: N/A;  . ELECTROPHYSIOLOGIC STUDY N/A 04/25/2016   Procedure: Cardioversion;  Surgeon: Minna Merritts, MD;  Location: [facility] ORS;  Service: Cardiovascular;  Laterality: N/A;  . ELECTROPHYSIOLOGIC STUDY N/A 07/18/2016   Procedure: Atrial Fibrillation Ablation;  Surgeon: Thompson Grayer, MD;  Location: [place] CV LAB;  Service: Cardiovascular;  Laterality: N/A;  . IMPLANTABLE CARDIOVERTER DEFIBRILLATOR IMPLANT  2005   [employer]   . LEFT HEART CATH AND CORS/GRAFTS ANGIOGRAPHY N/A 11/20/2018   Procedure: LEFT HEART CATH AND CORS/GRAFTS ANGIOGRAPHY;  Surgeon: Minna Merritts, MD;  Location: [place] CV LAB;  Service: Cardiovascular;  Laterality:  N/A;  . TEE WITHOUT CARDIOVERSION N/A 07/18/2016   Procedure: TRANSESOPHAGEAL ECHOCARDIOGRAM (TEE);  Surgeon: Sueanne Margarita, MD;  Location: [facility] ENDOSCOPY;  Service: Cardiovascular;  Laterality: N/A;  . TEE WITHOUT CARDIOVERSION N/A 10/09/2016   Procedure: TRANSESOPHAGEAL ECHOCARDIOGRAM (TEE);  Surgeon: Lelon Perla, MD;  Location: [facility] ENDOSCOPY;  Service: Cardiovascular;  Laterality: N/A;     No outpatient medications have been marked as taking for the 12/31/18 encounter (Appointment) with Minna Merritts, MD.     Allergies:   Piperacillin; Piperacillin-tazobactam in dex; and Zosyn [piperacillin sod-tazobactam so]   Social History   Tobacco Use  . Smoking status: Never Smoker  . Smokeless tobacco: Never Used  Substance Use Topics  . Alcohol use: No  . Drug use: No     Current Outpatient Medications on File Prior to Visit  Medication Sig Dispense Refill  . amitriptyline (ELAVIL) 25 MG tablet Take 25 mg by mouth at bedtime.   11  . amLODipine (NORVASC) 10 MG tablet TAKE 1 TABLET BY MOUTH DAILY 90 tablet 0  . apixaban (ELIQUIS) 5 MG TABS tablet Take 1 tablet (5 mg total) by mouth 2 (two) times daily. 60 tablet 9  . atorvastatin (LIPITOR) 40 MG tablet Take 40 mg by mouth at bedtime.   11  . Cholecalciferol (HM VITAMIN D3) 100 MCG (4000 UT) CAPS Take 4,000 Units by mouth daily.     . clopidogrel (PLAVIX) 75 MG tablet Take 1 tablet (75 mg) by mouth once daily 30 tablet 6  . gabapentin (NEURONTIN) 100 MG capsule Take 100 mg by mouth 2 (two) times daily.    . insulin aspart (NOVOLOG) 100 UNIT/ML injection Inject 5-10 Units into the skin 3 (three) times daily before meals. 10 units into the skin before breakfast then 5 units before lunch then 10 units before supper (evening meal)    . insulin glargine (LANTUS) 100 UNIT/ML injection Inject 0.15 mLs (15 Units total) into the skin 2 (two) times daily. 10 mL 1  . iron polysaccharides (NIFEREX) 150 MG capsule Take 150 mg by mouth daily.    .  isosorbide mononitrate (IMDUR) 30 MG 24 hr tablet Take 1 tablet (30 mg total) by mouth daily for 30 days. 30 tablet 0  . metoprolol tartrate (LOPRESSOR) 25 MG tablet Take 0.5 tablets (12.5 mg total) by mouth 2 (two) times daily for 30 days. 60 tablet 0  . Multiple Vitamins-Minerals (VITEYES AREDS FORMULA/LUTEIN) CAPS Take 1 capsule by mouth 2 (two) times daily.    . nitroGLYCERIN (NITROSTAT) 0.4 MG SL tablet Place 1 tablet (0.4 mg total) under the tongue every 5 (five) minutes x 3 doses as needed for chest pain. 30 tablet 0  . pantoprazole (PROTONIX) 40 MG tablet Take 40 mg by mouth at bedtime.    . sertraline (ZOLOFT) 100 MG tablet Take 100  mg by mouth at bedtime.   11  . torsemide (DEMADEX) 20 MG tablet Take 2 tablets (40 mg total) by mouth 2 (two) times daily. 360 tablet 0  . vitamin C (ASCORBIC ACID) 500 MG tablet Take 500 mg by mouth daily.     No current facility-administered medications on file prior to visit.      Family Hx: The patient's family history includes Atrial fibrillation in her mother; CAD in her mother; Diabetes in her father and mother; Lung cancer in her father.  ROS:   Please see the history of present illness.    Review of Systems  Constitutional: Negative.   Respiratory: Negative.   Cardiovascular: Negative.   Gastrointestinal: Negative.   Musculoskeletal: Negative.   Neurological: Negative.   Psychiatric/Behavioral: Negative.   All other systems reviewed and are negative.     Labs/Other Tests and Data Reviewed:    Recent Labs: 10/16/2018: B Natriuretic Peptide 1,501.0 10/17/2018: ALT 25 11/18/2018: Magnesium 2.0 11/23/2018: BUN 44; Creatinine, Ser 5.51; Hemoglobin 9.8; Platelets 209; Potassium 4.5; Sodium 131   Recent Lipid Panel Lab Results  Component Value Date/Time   CHOL 176 11/19/2018 04:07 AM   TRIG 109 11/19/2018 04:07 AM   HDL 40 (L) 11/19/2018 04:07 AM   CHOLHDL 4.4 11/19/2018 04:07 AM   LDLCALC 114 (H) 11/19/2018 04:07 AM    Wt Readings from  Last 3 Encounters:  11/23/18 224 lb 10.4 oz (101.9 kg)  11/07/18 201 lb (91.2 kg)  11/07/18 213 lb (96.6 kg)     Exam:    Vital Signs: Vital signs may also be detailed in the HPI There were no vitals taken for this visit.  Wt Readings from Last 3 Encounters:  11/23/18 224 lb 10.4 oz (101.9 kg)  11/07/18 201 lb (91.2 kg)  11/07/18 213 lb (96.6 kg)   Temp Readings from Last 3 Encounters:  11/23/18 97.9 F (36.6 C) (Oral)  10/31/18 98.1 F (36.7 C) (Oral)  04/19/18 97.8 F (36.6 C) (Oral)   BP Readings from Last 3 Encounters:  11/23/18 140/65  11/07/18 133/65  11/07/18 138/60   Pulse Readings from Last 3 Encounters:  11/23/18 68  11/07/18 (!) 56  11/07/18 (!) 59    vital 125/60 Pulse 58 resp 16  Well nourished, well developed female in no acute distress. Constitutional:  oriented to person, place, and time. No distress.  Head: Normocephalic and atraumatic.  Eyes:  no discharge. No scleral icterus.  Neck: Normal range of motion. Neck supple.  Pulmonary/Chest: No audible wheezing, no distress, appears comfortable Musculoskeletal: Normal range of motion.  no  tenderness or deformity.  Neurological:   Coordination normal. Full exam not performed Skin:  No rash Psychiatric:  normal mood and affect. behavior is normal. Thought content normal.    ASSESSMENT & PLAN:    Coronary artery disease of native artery of native heart with stable angina pectoris (HCC) Recent stent placed to vein graft 1 month ago  On Plavix and Eliquis Denies any symptoms concerning for angina  chronic diastolic CHF (congestive heart failure) (HCC) Fluid managed by peritoneal dialysis Denies significant shortness of breath Feels that she has several pounds more water to pull  Persistent atrial fibrillation Rate well controlled, on anticoagulation Heart rate in the mid to low 50s on very low-dose metoprolol We will continue to monitor for now  ICD (implantable cardioverter-defibrillator)  in place Followed by EP   ischemic cardiomyopathy Recent stent placed Discussed catheterization details with her and her daughter who  is on the phone   COVID-19 Education: The signs and symptoms of COVID-19 were discussed with the patient and how to seek care for testing (follow up with PCP or arrange E-visit).  The importance of social distancing was discussed today.  Patient Risk:   After full review of this patients clinical status, I feel that they are at least moderate risk at this time.  Time:   Today, I have spent 25 minutes with the patient with telehealth technology discussing the cardiac and medical problems/diagnoses detailed above   10 min spent reviewing the chart prior to patient visit today   Medication Adjustments/Labs and Tests Ordered: Current medicines are reviewed at length with the patient today.  Concerns regarding medicines are outlined above.   Tests Ordered: No tests ordered   Medication Changes: No changes made   Disposition: Follow-up in 6 months   Signed, Ida Rogue, MD  12/30/2018 6:12 PM    [facility] [address] [place] [address], [place], [place] [address]

## 2018-12-31 ENCOUNTER — Telehealth (INDEPENDENT_AMBULATORY_CARE_PROVIDER_SITE_OTHER): Payer: Self-pay

## 2018-12-31 ENCOUNTER — Encounter: Payer: Self-pay | Admitting: Cardiovascular Disease

## 2018-12-31 ENCOUNTER — Other Ambulatory Visit: Payer: Self-pay

## 2018-12-31 ENCOUNTER — Telehealth (INDEPENDENT_AMBULATORY_CARE_PROVIDER_SITE_OTHER): Payer: Medicare HMO | Admitting: Cardiovascular Disease

## 2018-12-31 VITALS — BP 148/83 | HR 59 | Temp 97.8°F | Ht 68.0 in | Wt 206.0 lb

## 2018-12-31 DIAGNOSIS — Z95810 Presence of automatic (implantable) cardiac defibrillator: Secondary | ICD-10-CM | POA: Diagnosis not present

## 2018-12-31 DIAGNOSIS — I5032 Chronic diastolic (congestive) heart failure: Secondary | ICD-10-CM | POA: Diagnosis not present

## 2018-12-31 DIAGNOSIS — I4819 Other persistent atrial fibrillation: Secondary | ICD-10-CM

## 2018-12-31 DIAGNOSIS — I25118 Atherosclerotic heart disease of native coronary artery with other forms of angina pectoris: Secondary | ICD-10-CM | POA: Diagnosis not present

## 2018-12-31 DIAGNOSIS — I255 Ischemic cardiomyopathy: Secondary | ICD-10-CM

## 2018-12-31 NOTE — Telephone Encounter (Signed)
I attempted to contact the patient's daughter to get the patient scheduled for a permcath removal. A message was left for a return call.

## 2018-12-31 NOTE — Patient Instructions (Signed)

## 2019-01-01 ENCOUNTER — Encounter: Payer: Self-pay | Admitting: *Deleted

## 2019-01-02 ENCOUNTER — Encounter (INDEPENDENT_AMBULATORY_CARE_PROVIDER_SITE_OTHER): Payer: Self-pay

## 2019-01-02 NOTE — Telephone Encounter (Signed)
Patient's daughter called back and was given the information regarding this appt for a permcath removal, scheduled for 01/06/2019 with a 11:00 am arrival time.

## 2019-01-02 NOTE — Telephone Encounter (Signed)
Attempted to contact the patient's daughter again today to schedule the patient for a permcath removal. A message was left for a return call. The patient will be scheduled and the information will be faxed to the patient's dialysis center.

## 2019-01-03 ENCOUNTER — Other Ambulatory Visit (INDEPENDENT_AMBULATORY_CARE_PROVIDER_SITE_OTHER): Payer: Self-pay | Admitting: Nurse Practitioner

## 2019-01-05 MED ORDER — CLINDAMYCIN PHOSPHATE 300 MG/50ML IV SOLN: 300.0000 mg | Freq: Once | INTRAVENOUS | Status: DC

## 2019-01-06 ENCOUNTER — Encounter
Admission: RE | Disposition: A | Discharge status: Home / Self Care | Payer: Self-pay | Source: Home / Self Care | Attending: Vascular Surgery

## 2019-01-06 ENCOUNTER — Ambulatory Visit
Admission: RE | Admit: 2019-01-06 | Discharge: 2019-01-06 | Disposition: A | Discharge status: Home / Self Care | Payer: Medicare HMO | Attending: Vascular Surgery | Admitting: Vascular Surgery

## 2019-01-06 DIAGNOSIS — Z452 Encounter for adjustment and management of vascular access device: Secondary | ICD-10-CM | POA: Diagnosis not present

## 2019-01-06 DIAGNOSIS — Z90710 Acquired absence of both cervix and uterus: Secondary | ICD-10-CM | POA: Diagnosis not present

## 2019-01-06 DIAGNOSIS — Z8249 Family history of ischemic heart disease and other diseases of the circulatory system: Secondary | ICD-10-CM | POA: Insufficient documentation

## 2019-01-06 DIAGNOSIS — Z955 Presence of coronary angioplasty implant and graft: Secondary | ICD-10-CM | POA: Diagnosis not present

## 2019-01-06 DIAGNOSIS — I132 Hypertensive heart and chronic kidney disease with heart failure and with stage 5 chronic kidney disease, or end stage renal disease: Secondary | ICD-10-CM | POA: Insufficient documentation

## 2019-01-06 DIAGNOSIS — Z992 Dependence on renal dialysis: Secondary | ICD-10-CM | POA: Diagnosis not present

## 2019-01-06 DIAGNOSIS — I4819 Other persistent atrial fibrillation: Secondary | ICD-10-CM | POA: Diagnosis not present

## 2019-01-06 DIAGNOSIS — T82898A Other specified complication of vascular prosthetic devices, implants and grafts, initial encounter: Secondary | ICD-10-CM | POA: Diagnosis not present

## 2019-01-06 DIAGNOSIS — Z89511 Acquired absence of right leg below knee: Secondary | ICD-10-CM | POA: Insufficient documentation

## 2019-01-06 DIAGNOSIS — E785 Hyperlipidemia, unspecified: Secondary | ICD-10-CM | POA: Diagnosis not present

## 2019-01-06 DIAGNOSIS — I120 Hypertensive chronic kidney disease with stage 5 chronic kidney disease or end stage renal disease: Secondary | ICD-10-CM

## 2019-01-06 DIAGNOSIS — E1122 Type 2 diabetes mellitus with diabetic chronic kidney disease: Secondary | ICD-10-CM | POA: Insufficient documentation

## 2019-01-06 DIAGNOSIS — K219 Gastro-esophageal reflux disease without esophagitis: Secondary | ICD-10-CM | POA: Insufficient documentation

## 2019-01-06 DIAGNOSIS — Z880 Allergy status to penicillin: Secondary | ICD-10-CM | POA: Diagnosis not present

## 2019-01-06 DIAGNOSIS — Z8674 Personal history of sudden cardiac arrest: Secondary | ICD-10-CM | POA: Diagnosis not present

## 2019-01-06 DIAGNOSIS — Z833 Family history of diabetes mellitus: Secondary | ICD-10-CM | POA: Diagnosis not present

## 2019-01-06 DIAGNOSIS — Z951 Presence of aortocoronary bypass graft: Secondary | ICD-10-CM | POA: Diagnosis not present

## 2019-01-06 DIAGNOSIS — I255 Ischemic cardiomyopathy: Secondary | ICD-10-CM | POA: Insufficient documentation

## 2019-01-06 DIAGNOSIS — Z89512 Acquired absence of left leg below knee: Secondary | ICD-10-CM | POA: Insufficient documentation

## 2019-01-06 DIAGNOSIS — I2510 Atherosclerotic heart disease of native coronary artery without angina pectoris: Secondary | ICD-10-CM | POA: Diagnosis not present

## 2019-01-06 DIAGNOSIS — N186 End stage renal disease: Secondary | ICD-10-CM | POA: Diagnosis not present

## 2019-01-06 DIAGNOSIS — E1151 Type 2 diabetes mellitus with diabetic peripheral angiopathy without gangrene: Secondary | ICD-10-CM | POA: Diagnosis not present

## 2019-01-06 HISTORY — PX: DIALYSIS/PERMA CATHETER REMOVAL: CATH118289

## 2019-01-06 SURGERY — DIALYSIS/PERMA CATHETER REMOVAL
Anesthesia: LOCAL

## 2019-01-06 MED ORDER — LIDOCAINE-EPINEPHRINE (PF) 1 %-1:200000 IJ SOLN
INTRAMUSCULAR | Status: AC
  Filled 2019-01-06: qty 30

## 2019-01-06 MED ORDER — LIDOCAINE-EPINEPHRINE (PF) 1 %-1:200000 IJ SOLN
INTRAMUSCULAR | Status: DC | PRN
  Administered 2019-01-06: 20 mL via INTRADERMAL

## 2019-01-06 SURGICAL SUPPLY — 2 items
FORCEPS HALSTEAD CVD 5IN STRL (INSTRUMENTS) ×2 IMPLANT
TRAY LACERAT/PLASTIC (MISCELLANEOUS) ×2 IMPLANT

## 2019-01-06 NOTE — H&P (Addendum)
[facility] Admission History & Physical  MRN : [id number]  Caroline Hernandez is a 73 y.o. (1946/05/08) female who presents with chief complaint of No chief complaint on file. Marland Kitchen  History of Present Illness: I am asked to evaluate the patient by the dialysis center. The patient was sent here because they have a nonfunctioning tunneled catheter and a functioning PD catheter.  The patient reports they're not been any problems with any of their dialysis runs. They are reporting good flows with good parameters at peritoneal dialysis.  Patient denies pain or tenderness overlying the access.  There is no pain with dialysis.  The patient denies hand pain or finger pain consistent with steal syndrome.  No fevers or chills while on dialysis.   No current facility-administered medications for this encounter.     Past Medical History:  Diagnosis Date  . Amputation of left lower extremity below knee ([place])   . Amputation of right lower extremity below knee ([place])   . CAD (coronary artery disease)    a. 2004 Cardiac Arrest/CABG x 3 (LIMA->LAD, VG->OM, VG->RCA);  b. 06/2015 lexiscan MV: no significant ischemia, EF 48%, low risk->Med Rx.  . Chronic combined systolic and diastolic CHF (congestive heart failure) ([place])    a. 12/2014 Echo: EF 45-50%;  b. 08/2015 Echo: EF 45-50%, ant, antsept HK, mildly dil LA, nl RV, mild-mod TR, sev PAH (48mmHg); b. 08/2017 TEE: EF 40-45%, large PFO w/ L->R shunting.  . CKD (chronic kidney disease), stage IV ([place])   . CLL (chronic lymphocytic leukemia) ([place])   . Diabetes mellitus without complication ([place])   . Essential hypertension   . GERD (gastroesophageal reflux disease)   . Hiatal hernia   . History of cardiac arrest    a. 2004.  Marland Kitchen Hyperlipidemia   . Ischemic cardiomyopathy    a. s/p MDT ICD (originally had 7616 lead-->gen change and lead revision ~ 2012 @ [place]); b. 12/2014 Echo: EF 45-50%;  c.08/2015 Echo: EF 45-50%; d. 08/2017 Echo: EF 40-45%.   Marland Kitchen PAD (peripheral artery disease) ([facility])    a. s/p bilat BKA  . Persistent atrial fibrillation    a. Dx 12/2014.  CHA2DS2VASc = 6--> warfarin;  b. 09/2015 s/p DCCV-->on amio; c. s/p DCCV 04/25/16; d. 07/2016 s/p RFCA/PVI in setting of recurrent afib despite amio; e. 05/2018 Recurrent afib.    Past Surgical History:  Procedure Laterality Date  . ABDOMINAL HYSTERECTOMY    . Amputation lower extremity bilaterally Bilateral   . CAPD INSERTION N/A 10/25/2018   Procedure: LAPAROSCOPIC INSERTION CONTINUOUS AMBULATORY PERITONEAL DIALYSIS  (CAPD) CATHETER;  Surgeon: Katha Cabal, MD;  Location: [facility] ORS;  Service: Vascular;  Laterality: N/A;  . CARDIAC CATHETERIZATION    . CARDIOVERSION N/A 10/09/2016   Procedure: CARDIOVERSION;  Surgeon: Lelon Perla, MD;  Location: [facility] ENDOSCOPY;  Service: Cardiovascular;  Laterality: N/A;  . CORONARY ARTERY BYPASS GRAFT    . CORONARY STENT INTERVENTION N/A 11/20/2018   Procedure: CORONARY STENT INTERVENTION;  Surgeon: Isaias Cowman, MD;  Location: [facility] [facility];  Service: Cardiovascular;  Laterality: N/A;  SVG- OM  . DIALYSIS/PERMA CATHETER INSERTION N/A 10/22/2018   Procedure: DIALYSIS/PERMA CATHETER INSERTION;  Surgeon: Katha Cabal, MD;  Location: [place];  Service: Cardiovascular;  Laterality: N/A;  . ELECTROPHYSIOLOGIC STUDY N/A 10/07/2015   Procedure: CARDIOVERSION;  Surgeon: Minna Merritts, MD;  Location: [facility] ORS;  Service: Cardiovascular;  Laterality: N/A;  . ELECTROPHYSIOLOGIC STUDY N/A 04/25/2016   Procedure: Cardioversion;  Surgeon: Minna Merritts, MD;  Location: [facility] ORS;  Service: Cardiovascular;  Laterality: N/A;  . ELECTROPHYSIOLOGIC STUDY N/A 07/18/2016   Procedure: Atrial Fibrillation Ablation;  Surgeon: Thompson Grayer, MD;  Location: [facility];  Service: Cardiovascular;  Laterality: N/A;  . IMPLANTABLE CARDIOVERTER DEFIBRILLATOR IMPLANT  2005   [employer]   . LEFT HEART CATH AND CORS/GRAFTS  ANGIOGRAPHY N/A 11/20/2018   Procedure: LEFT HEART CATH AND CORS/GRAFTS ANGIOGRAPHY;  Surgeon: Minna Merritts, MD;  Location: [place];  Service: Cardiovascular;  Laterality: N/A;  . TEE WITHOUT CARDIOVERSION N/A 07/18/2016   Procedure: TRANSESOPHAGEAL ECHOCARDIOGRAM (TEE);  Surgeon: Sueanne Margarita, MD;  Location: [facility] ENDOSCOPY;  Service: Cardiovascular;  Laterality: N/A;  . TEE WITHOUT CARDIOVERSION N/A 10/09/2016   Procedure: TRANSESOPHAGEAL ECHOCARDIOGRAM (TEE);  Surgeon: Lelon Perla, MD;  Location: [facility] ENDOSCOPY;  Service: Cardiovascular;  Laterality: N/A;    Social History Social History   Tobacco Use  . Smoking status: Never Smoker  . Smokeless tobacco: Never Used  Substance Use Topics  . Alcohol use: No  . Drug use: No    Family History Family History  Problem Relation Age of Onset  . CAD Mother   . Diabetes Mother   . Atrial fibrillation Mother   . Lung cancer Father   . Diabetes Father     No family history of bleeding or clotting disorders, autoimmune disease or porphyria  Allergies  Allergen Reactions  . Piperacillin Other (See Comments)    Renal failure  . 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 See ID note**PER PT-CAUSED RENAL FAILURE**  . Zosyn [Piperacillin Sod-Tazobactam So] Other (See Comments)    Renal failure     REVIEW OF SYSTEMS (Negative unless checked)  Constitutional: [] Weight loss  [] Fever  [] Chills Cardiac: [] Chest pain   [x] Chest pressure   [x] Palpitations   [] Shortness of breath when laying flat   [] Shortness of breath at rest   [x] Shortness of breath with exertion. Vascular:  [] Pain in legs with walking   [] Pain in legs at rest   [] Pain in legs when laying flat   [] Claudication   [] Pain in feet when walking  [] Pain in feet at rest  [] Pain in feet when laying flat   [] History of DVT   [] Phlebitis   [] Swelling in legs   [] Varicose veins   [] Non-healing ulcers Pulmonary:   [] Uses home oxygen    [] Productive cough   [] Hemoptysis   [] Wheeze  [] COPD   [] Asthma Neurologic:  [x] Dizziness  [] Blackouts   [] Seizures   [] History of stroke   [] History of TIA  [] Aphasia   [] Temporary blindness   [] Dysphagia   [] Weakness or numbness in arms   [] Weakness or numbness in legs Musculoskeletal:  [] Arthritis   [] Joint swelling   [] Joint pain   [] Low back pain Hematologic:  [] Easy bruising  [] Easy bleeding   [] Hypercoagulable state   [x] Anemic  [] Hepatitis Gastrointestinal:  [] Blood in stool   [] Vomiting blood  [x] Gastroesophageal reflux/heartburn   [] Difficulty swallowing. Genitourinary:  [x] Chronic kidney disease   [] Difficult urination  [] Frequent urination  [] Burning with urination   [] Blood in urine Skin:  [] Rashes   [] Ulcers   [] Wounds Psychological:  [] History of anxiety   []  History of major depression.  Physical Examination  Vitals:   01/06/19 1106  BP: (!) 143/68  Pulse: 67  Resp: 20  Temp: 98 F (36.7 C)  SpO2: 100%  Weight: 88.9 kg  Height: 5\' 8"  (  1.727 m)   Body mass index is 29.8 kg/m. Gen: WD/WN, NAD Head: [place]/AT, No temporalis wasting.  Ear/Nose/Throat: Hearing grossly intact, nares w/o erythema or drainage, oropharynx w/o Erythema/Exudate,  Eyes: Conjunctiva clear, sclera non-icteric Neck: Trachea midline.  No JVD.  Pulmonary:  Good air movement, respirations not labored, no use of accessory muscles.  Cardiac: RRR, normal S1, S2. Vascular:  Vessel Right Left  Radial Palpable Palpable              Gastrointestinal: soft, non-tender/non-distended. No guarding/reflex. PD catheter in place Musculoskeletal: M/S 5/5 throughout.  Bilateral LE amputations Neurologic: Sensation grossly intact in extremities.  Symmetrical.  Speech is fluent. Motor exam as listed above. Psychiatric: Judgment intact, Mood & affect appropriate for pt's clinical situation. Dermatologic: No rashes or ulcers noted.  No cellulitis or open wounds.    CBC Lab Results  Component Value Date   WBC  32.0 (H) 11/23/2018   HGB 9.8 (L) 11/23/2018   HCT 32.7 (L) 11/23/2018   MCV 87.7 11/23/2018   PLT 209 11/23/2018    BMET    Component Value Date/Time   NA 131 (L) 11/23/2018 0422   NA 142 10/07/2018 1425   K 4.5 11/23/2018 0422   CL 96 (L) 11/23/2018 0422   CO2 23 11/23/2018 0422   GLUCOSE 130 (H) 11/23/2018 0422   BUN 44 (H) 11/23/2018 0422   BUN 39 (H) 10/07/2018 1425   CREATININE 5.51 (H) 11/23/2018 0422   CALCIUM 8.1 (L) 11/23/2018 0422   GFRNONAA 7 (L) 11/23/2018 0422   GFRAA 8 (L) 11/23/2018 0422   CrCl cannot be calculated (Patient's most recent lab result is older than the maximum 21 days allowed.).  COAG Lab Results  Component Value Date   INR 1.5 (H) 11/18/2018   INR 1.16 10/27/2018   INR 1.08 10/26/2018    Radiology No results found.  Assessment/Plan 1.  Complication dialysis device with non-functional permcath:  Patient's Tunneled catheter is not being used. The patient has a PD catheter that is functioning well. Therefore, the patient will undergo removal of the tunneled catheter under local anesthesia.  The risks and benefits were described to the patient.  All questions were answered.  The patient agrees to proceed with angiography and intervention. Potassium will be drawn to ensure that it is an appropriate level prior to performing intervention. 2.  End-stage renal disease requiring dialysis:  Patient will continue dialysis therapy without further interruption 3.  Hypertension:  Patient will continue medical management; nephrology is following no changes in oral medications. 4. Diabetes mellitus:  Glucose will be monitored and oral medications been held this morning once the patient has undergone the patient's procedure po intake will be reinitiated and again Accu-Cheks will be used to assess the blood glucose level and treat as needed. The patient will be restarted on the patient's usual hypoglycemic regime 5.  Coronary artery disease:  EKG will be  monitored. Nitrates will be used if needed. The patient's oral cardiac medications will be continued.    Leotis Pain, MD  01/06/2019 11:25 AM

## 2019-01-06 NOTE — Discharge Instructions (Signed)
Incision Care, Adult °An incision is a cut that a doctor makes in your skin for surgery (for a procedure). Most times, these cuts are closed after surgery. Your cut from surgery may be closed with stitches (sutures), staples, skin glue, or skin tape (adhesive strips). You may need to return to your doctor to have stitches or staples taken out. This may happen many days or many weeks after your surgery. The cut needs to be well cared for so it does not get infected. °How to care for your cut °Cut care ° °· Follow instructions from your doctor about how to take care of your cut. Make sure you: °? Wash your hands with soap and water before you change your bandage (dressing). If you cannot use soap and water, use hand sanitizer. °? Change your bandage as told by your doctor. °? Leave stitches, skin glue, or skin tape in place. They may need to stay in place for 2 weeks or longer. If tape strips get loose and curl up, you may trim the loose edges. Do not remove tape strips completely unless your doctor says it is okay. °· Check your cut area every day for signs of infection. Check for: °? More redness, swelling, or pain. °? More fluid or blood. °? Warmth. °? Pus or a bad smell. °· Ask your doctor how to clean the cut. This may include: °? Using mild soap and water. °? Using a clean towel to pat the cut dry after you clean it. °? Putting a cream or ointment on the cut. Do this only as told by your doctor. °? Covering the cut with a clean bandage. °· Ask your doctor when you can leave the cut uncovered. °· Do not take baths, swim, or use a hot tub until your doctor says it is okay. Ask your doctor if you can take showers. You may only be allowed to take sponge baths for bathing. °Medicines °· If you were prescribed an antibiotic medicine, cream, or ointment, take the antibiotic or put it on the cut as told by your doctor. Do not stop taking or putting on the antibiotic even if your condition gets better. °· Take  over-the-counter and prescription medicines only as told by your doctor. °General instructions °· Limit movement around your cut. This helps healing. °? Avoid straining, lifting, or exercise for the first month, or for as long as told by your doctor. °? Follow instructions from your doctor about going back to your normal activities. °? Ask your doctor what activities are safe. °· Protect your cut from the sun when you are outside for the first 6 months, or for as long as told by your doctor. Put on sunscreen around the scar or cover up the scar. °· Keep all follow-up visits as told by your doctor. This is important. °Contact a doctor if: °· Your have more redness, swelling, or pain around the cut. °· You have more fluid or blood coming from the cut. °· Your cut feels warm to the touch. °· You have pus or a bad smell coming from the cut. °· You have a fever or shaking chills. °· You feel sick to your stomach (nauseous) or you throw up (vomit). °· You are dizzy. °· Your stitches or staples come undone. °Get help right away if: °· You have a red streak coming from your cut. °· Your cut bleeds through the bandage and the bleeding does not stop with gentle pressure. °· The edges of your cut   open up and separate. °· You have very bad (severe) pain. °· You have a rash. °· You are confused. °· You pass out (faint). °· You have trouble breathing and you have a fast heartbeat. °This information is not intended to replace advice given to you by your health care provider. Make sure you discuss any questions you have with your health care provider. °Document Released: 11/20/2011 Document Revised: 05/05/2016 Document Reviewed: 05/05/2016 °Elsevier Interactive Patient Education © 2019 [employer] ° °

## 2019-01-06 NOTE — Op Note (Signed)
Operative Note     Preoperative diagnosis:   1. ESRD with functional permanent access  Postoperative diagnosis:  1. ESRD with functional permanent access  Procedure:  Removal of right Permcath  Surgeon:  Leotis Pain, MD  Anesthesia:  Local  EBL:  Minimal  Indication for the Procedure:  The patient has a functional permanent dialysis access (PD catheter) and no longer needs their permcath.  This can be removed.  Risks and benefits are discussed and informed consent is obtained.  Description of the Procedure:  The patient's right neck, chest and existing catheter were sterilely prepped and draped. The area around the catheter was anesthetized copiously with 1% lidocaine. The catheter was dissected out with curved hemostats until the cuff was freed from the surrounding fibrous sheath. The fiber sheath was transected, and the catheter was then removed in its entirety using gentle traction. Pressure was held and sterile dressings were placed. The patient tolerated the procedure well and was taken to the recovery room in stable condition.     Leotis Pain  01/06/2019, 11:50 AM This note was created with Dragon Medical transcription system. Any errors in dictation are purely unintentional.

## 2019-01-08 ENCOUNTER — Encounter: Payer: Self-pay | Admitting: Vascular Surgery

## 2019-02-06 ENCOUNTER — Encounter: Payer: Medicare HMO | Admitting: *Deleted

## 2019-02-07 ENCOUNTER — Telehealth: Payer: Self-pay

## 2019-02-07 NOTE — Telephone Encounter (Signed)
Left message for patient to remind of missed remote transmission.  

## 2019-02-13 ENCOUNTER — Encounter: Payer: Self-pay | Admitting: Cardiology

## 2019-02-15 ENCOUNTER — Inpatient Hospital Stay
Admission: EM | Admit: 2019-02-15 | Discharge: 2019-02-18 | DRG: 689 | Disposition: A | Discharge status: Home / Self Care | Payer: Medicare Other | Attending: Internal Medicine | Admitting: Internal Medicine

## 2019-02-15 ENCOUNTER — Emergency Department: Payer: Medicare Other

## 2019-02-15 DIAGNOSIS — Z992 Dependence on renal dialysis: Secondary | ICD-10-CM

## 2019-02-15 DIAGNOSIS — Z951 Presence of aortocoronary bypass graft: Secondary | ICD-10-CM

## 2019-02-15 DIAGNOSIS — Z833 Family history of diabetes mellitus: Secondary | ICD-10-CM

## 2019-02-15 DIAGNOSIS — D631 Anemia in chronic kidney disease: Secondary | ICD-10-CM | POA: Diagnosis not present

## 2019-02-15 DIAGNOSIS — C9111 Chronic lymphocytic leukemia of B-cell type in remission: Secondary | ICD-10-CM | POA: Diagnosis present

## 2019-02-15 DIAGNOSIS — R109 Unspecified abdominal pain: Secondary | ICD-10-CM

## 2019-02-15 DIAGNOSIS — I4819 Other persistent atrial fibrillation: Secondary | ICD-10-CM | POA: Diagnosis not present

## 2019-02-15 DIAGNOSIS — E1122 Type 2 diabetes mellitus with diabetic chronic kidney disease: Secondary | ICD-10-CM | POA: Diagnosis not present

## 2019-02-15 DIAGNOSIS — N186 End stage renal disease: Secondary | ICD-10-CM | POA: Diagnosis not present

## 2019-02-15 DIAGNOSIS — I2510 Atherosclerotic heart disease of native coronary artery without angina pectoris: Secondary | ICD-10-CM | POA: Diagnosis present

## 2019-02-15 DIAGNOSIS — Z79899 Other long term (current) drug therapy: Secondary | ICD-10-CM

## 2019-02-15 DIAGNOSIS — Z89511 Acquired absence of right leg below knee: Secondary | ICD-10-CM

## 2019-02-15 DIAGNOSIS — I252 Old myocardial infarction: Secondary | ICD-10-CM

## 2019-02-15 DIAGNOSIS — I255 Ischemic cardiomyopathy: Secondary | ICD-10-CM | POA: Diagnosis present

## 2019-02-15 DIAGNOSIS — E1369 Other specified diabetes mellitus with other specified complication: Secondary | ICD-10-CM

## 2019-02-15 DIAGNOSIS — Z89512 Acquired absence of left leg below knee: Secondary | ICD-10-CM | POA: Diagnosis not present

## 2019-02-15 DIAGNOSIS — I5022 Chronic systolic (congestive) heart failure: Secondary | ICD-10-CM | POA: Diagnosis not present

## 2019-02-15 DIAGNOSIS — Z90710 Acquired absence of both cervix and uterus: Secondary | ICD-10-CM | POA: Diagnosis not present

## 2019-02-15 DIAGNOSIS — Z7901 Long term (current) use of anticoagulants: Secondary | ICD-10-CM

## 2019-02-15 DIAGNOSIS — E785 Hyperlipidemia, unspecified: Secondary | ICD-10-CM | POA: Diagnosis present

## 2019-02-15 DIAGNOSIS — I132 Hypertensive heart and chronic kidney disease with heart failure and with stage 5 chronic kidney disease, or end stage renal disease: Secondary | ICD-10-CM | POA: Diagnosis present

## 2019-02-15 DIAGNOSIS — Z955 Presence of coronary angioplasty implant and graft: Secondary | ICD-10-CM | POA: Diagnosis not present

## 2019-02-15 DIAGNOSIS — E1140 Type 2 diabetes mellitus with diabetic neuropathy, unspecified: Secondary | ICD-10-CM | POA: Diagnosis present

## 2019-02-15 DIAGNOSIS — Z7902 Long term (current) use of antithrombotics/antiplatelets: Secondary | ICD-10-CM

## 2019-02-15 DIAGNOSIS — Z1159 Encounter for screening for other viral diseases: Secondary | ICD-10-CM | POA: Diagnosis not present

## 2019-02-15 DIAGNOSIS — N3080 Other cystitis without hematuria: Secondary | ICD-10-CM

## 2019-02-15 DIAGNOSIS — D696 Thrombocytopenia, unspecified: Secondary | ICD-10-CM | POA: Diagnosis present

## 2019-02-15 DIAGNOSIS — N3001 Acute cystitis with hematuria: Principal | ICD-10-CM | POA: Diagnosis present

## 2019-02-15 DIAGNOSIS — Z794 Long term (current) use of insulin: Secondary | ICD-10-CM

## 2019-02-15 DIAGNOSIS — E1151 Type 2 diabetes mellitus with diabetic peripheral angiopathy without gangrene: Secondary | ICD-10-CM | POA: Diagnosis not present

## 2019-02-15 DIAGNOSIS — K219 Gastro-esophageal reflux disease without esophagitis: Secondary | ICD-10-CM | POA: Diagnosis present

## 2019-02-15 DIAGNOSIS — N390 Urinary tract infection, site not specified: Secondary | ICD-10-CM | POA: Diagnosis present

## 2019-02-15 LAB — COMPREHENSIVE METABOLIC PANEL
ALT: 10 U/L (ref 0–44)
AST: 26 U/L (ref 15–41)
Albumin: 3.6 g/dL (ref 3.5–5.0)
Alkaline Phosphatase: 77 U/L (ref 38–126)
Anion gap: 12 (ref 5–15)
BUN: 46 mg/dL — ABNORMAL HIGH (ref 8–23)
CO2: 25 mmol/L (ref 22–32)
Calcium: 8.6 mg/dL — ABNORMAL LOW (ref 8.9–10.3)
Chloride: 99 mmol/L (ref 98–111)
Creatinine, Ser: 4.55 mg/dL — ABNORMAL HIGH (ref 0.44–1.00)
GFR calc Af Amer: 11 mL/min — ABNORMAL LOW (ref >60)
GFR calc non Af Amer: 9 mL/min — ABNORMAL LOW (ref >60)
Glucose, Bld: 228 mg/dL — ABNORMAL HIGH (ref 70–99)
Potassium: 4.4 mmol/L (ref 3.5–5.1)
Sodium: 136 mmol/L (ref 135–145)
Total Bilirubin: 1.3 mg/dL — ABNORMAL HIGH (ref 0.3–1.2)
Total Protein: 6.5 g/dL (ref 6.5–8.1)

## 2019-02-15 LAB — CBC WITH DIFFERENTIAL/PLATELET
Abs Immature Granulocytes: 0.13 10*3/uL — ABNORMAL HIGH (ref 0.00–0.07)
Basophils Absolute: 0.1 10*3/uL (ref 0.0–0.1)
Basophils Relative: 0 %
Eosinophils Absolute: 0.2 10*3/uL (ref 0.0–0.5)
Eosinophils Relative: 1 %
HCT: 35.5 % — ABNORMAL LOW (ref 36.0–46.0)
Hemoglobin: 10.9 g/dL — ABNORMAL LOW (ref 12.0–15.0)
Immature Granulocytes: 0 %
Lymphocytes Relative: 67 %
Lymphs Abs: 21.7 10*3/uL — ABNORMAL HIGH (ref 0.7–4.0)
MCH: 27.9 pg (ref 26.0–34.0)
MCHC: 30.7 g/dL (ref 30.0–36.0)
MCV: 91 fL (ref 80.0–100.0)
Monocytes Absolute: 0.8 10*3/uL (ref 0.1–1.0)
Monocytes Relative: 2 %
Neutro Abs: 9.6 10*3/uL — ABNORMAL HIGH (ref 1.7–7.7)
Neutrophils Relative %: 30 %
Platelets: 147 10*3/uL — ABNORMAL LOW (ref 150–400)
RBC: 3.9 MIL/uL (ref 3.87–5.11)
RDW: 15.5 % (ref 11.5–15.5)
Smear Review: NORMAL
WBC: 32.6 10*3/uL — ABNORMAL HIGH (ref 4.0–10.5)
nRBC: 0 % (ref 0.0–0.2)

## 2019-02-15 LAB — LIPASE, BLOOD: Lipase: 34 U/L (ref 11–51)

## 2019-02-15 LAB — URINALYSIS, COMPLETE (UACMP) WITH MICROSCOPIC
RBC / HPF: >50 RBC/hpf — ABNORMAL HIGH (ref 0–5)
Specific Gravity, Urine: 1.018 (ref 1.005–1.030)
WBC, UA: >50 WBC/hpf — ABNORMAL HIGH (ref 0–5)

## 2019-02-15 LAB — SARS CORONAVIRUS 2 BY RT PCR (HOSPITAL ORDER, PERFORMED IN ~~LOC~~ HOSPITAL LAB): SARS Coronavirus 2: NEGATIVE

## 2019-02-15 MED ORDER — APIXABAN 5 MG PO TABS
5.0000 mg | ORAL_TABLET | ORAL | Status: AC
  Administered 2019-02-15: 5 mg via ORAL
  Filled 2019-02-15: qty 1

## 2019-02-15 MED ORDER — TRAMADOL HCL 50 MG PO TABS
50.0000 mg | ORAL_TABLET | Freq: Once | ORAL | Status: AC
  Administered 2019-02-15: 50 mg via ORAL
  Filled 2019-02-15: qty 1

## 2019-02-15 MED ORDER — SODIUM CHLORIDE 0.9 % IV SOLN
2.0000 g | Freq: Once | INTRAVENOUS | Status: AC
  Administered 2019-02-15: 22:00:00 2 g via INTRAVENOUS
  Filled 2019-02-15: qty 2

## 2019-02-15 MED ORDER — SODIUM CHLORIDE 0.9 % IV SOLN: 1.0000 g | INTRAVENOUS | Status: DC

## 2019-02-15 MED ORDER — NYSTATIN 100000 UNIT/GM EX POWD
Freq: Once | CUTANEOUS | Status: AC
  Administered 2019-02-15: 21:00:00 via TOPICAL
  Filled 2019-02-15: qty 15

## 2019-02-15 MED ORDER — GABAPENTIN 100 MG PO CAPS
100.0000 mg | ORAL_CAPSULE | ORAL | Status: AC
  Administered 2019-02-15: 100 mg via ORAL
  Filled 2019-02-15: qty 1

## 2019-02-15 NOTE — ED Notes (Signed)
Answered pt's call bell; says she vomited on the blanket; "it just comes on so fast"; given clean warm blanket; ice chips as allowed; sat up some in bed; given emesis bag; pt says after she vomits she feels better; no requests, offered to check on nausea medication but she declined; side rails up x 2; call bell in reach;

## 2019-02-15 NOTE — ED Triage Notes (Signed)
Patient to [facility] via EMS from home.  Reports patient with right shoulder pain that radiates into right flank with nausea.  Per EMS patient pulse oxi with 89% on room air but up to 94% with O2 at 3 liters (patient has this at home as needed, usus ally at night).  BP 130/p, CBG 260.  Patient received zofran 4 mg IM (left deltoid).  Patient also took her 6 pm insulins prior to ems arrival and has not eaten.

## 2019-02-15 NOTE — ED Provider Notes (Signed)
[facility] Emergency Department Provider Note  ____________________________________________  Time seen: Approximately 8:05 PM  I have reviewed the triage vital signs and the nursing notes.   HISTORY  Chief Complaint Shoulder Pain and Flank Pain    HPI Caroline Hernandez is a 73 y.o. female with a history of diabetes, end-stage renal disease, GERD and CLL presents to the emergency department with right flank pain and left flank pain for the past 2 to 3 days.  Patient states that she feels like her right flank pain radiates to the left flank.  She denies dysuria, hematuria or increased urinary frequency.  She reports that she continues to produce urine and typically urinates approximately 3 times a day.  Patient reports that pain has been so severe that she has had multiple episodes of nonbloody emesis.  She denies diarrhea or dark stools.  She denies chest pain or chest tightness.  Patient reports chronic shortness of breath and she is on 4 L of supplemental oxygen at home.  Patient states that she has never experienced similar symptoms in the past. No other alleviating measures have been attempted.         Past Medical History:  Diagnosis Date  . Amputation of left lower extremity below knee ([place])   . Amputation of right lower extremity below knee ([place])   . CAD (coronary artery disease)    a. 2004 Cardiac Arrest/CABG x 3 (LIMA->LAD, VG->OM, VG->RCA);  b. 06/2015 lexiscan MV: no significant ischemia, EF 48%, low risk->Med Rx.  . Chronic combined systolic and diastolic CHF (congestive heart failure) ([place])    a. 12/2014 Echo: EF 45-50%;  b. 08/2015 Echo: EF 45-50%, ant, antsept HK, mildly dil LA, nl RV, mild-mod TR, sev PAH (60mmHg); b. 08/2017 TEE: EF 40-45%, large PFO w/ L->R shunting.  . CKD (chronic kidney disease), stage IV ([place])   . CLL (chronic lymphocytic leukemia) ([place])   . Diabetes mellitus without complication ([place])   . Essential hypertension   . GERD  (gastroesophageal reflux disease)   . Hiatal hernia   . History of cardiac arrest    a. 2004.  Marland Kitchen Hyperlipidemia   . Ischemic cardiomyopathy    a. s/p MDT ICD (originally had 3570 lead-->gen change and lead revision ~ 2012 @ [place]); b. 12/2014 Echo: EF 45-50%;  c.08/2015 Echo: EF 45-50%; d. 08/2017 Echo: EF 40-45%.  Marland Kitchen PAD (peripheral artery disease) (HCC)    a. s/p bilat BKA  . Persistent atrial fibrillation    a. Dx 12/2014.  CHA2DS2VASc = 6--> warfarin;  b. 09/2015 s/p DCCV-->on amio; c. s/p DCCV 04/25/16; d. 07/2016 s/p RFCA/PVI in setting of recurrent afib despite amio; e. 05/2018 Recurrent afib.    Patient Active Problem List   Diagnosis Date Noted  . NSTEMI (non-ST elevated myocardial infarction) ([place]) 11/18/2018  . CLL (chronic lymphocytic leukemia) ([place]) 10/23/2018  . Pressure injury of skin 10/19/2018  . CHF (congestive heart failure) ([name]) 10/16/2018  . CAP (community acquired pneumonia) 04/14/2018  . UTI (urinary tract infection) 03/07/2018  . Atrial fibrillation with slow ventricular response ([place]) 07/18/2016  . Chronic diastolic CHF (congestive heart failure) ([place])   . SOB (shortness of breath)   . Acute on chronic systolic heart failure ([place]) 08/20/2015  . CKD (chronic kidney disease), stage IV ([place])   . Ischemic cardiomyopathy   . Chronic combined systolic and diastolic CHF (congestive heart failure) ([place])   . Essential hypertension 06/18/2015  . Systolic and diastolic CHF, acute on chronic ([place])   .  Acute on chronic renal failure ([place])   . Angina pectoris ([place])   . Coronary artery disease involving native coronary artery of native heart with angina pectoris with documented spasm ([place])   . 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 ([place])   . Persistent atrial fibrillation   . Bradycardia   . Chest pain 06/12/2015    Past Surgical History:  Procedure Laterality Date  . ABDOMINAL HYSTERECTOMY    . Amputation lower extremity bilaterally Bilateral   .  CAPD INSERTION N/A 10/25/2018   Procedure: LAPAROSCOPIC INSERTION CONTINUOUS AMBULATORY PERITONEAL DIALYSIS  (CAPD) CATHETER;  Surgeon: Katha Cabal, MD;  Location: [facility] ORS;  Service: Vascular;  Laterality: N/A;  . CARDIAC CATHETERIZATION    . CARDIOVERSION N/A 10/09/2016   Procedure: CARDIOVERSION;  Surgeon: Lelon Perla, MD;  Location: [facility] ENDOSCOPY;  Service: Cardiovascular;  Laterality: N/A;  . CORONARY ARTERY BYPASS GRAFT    . CORONARY STENT INTERVENTION N/A 11/20/2018   Procedure: CORONARY STENT INTERVENTION;  Surgeon: Isaias Cowman, MD;  Location: [place] CV LAB;  Service: Cardiovascular;  Laterality: N/A;  SVG- OM  . DIALYSIS/PERMA CATHETER INSERTION N/A 10/22/2018   Procedure: DIALYSIS/PERMA CATHETER INSERTION;  Surgeon: Katha Cabal, MD;  Location: [place] [facility] LAB;  Service: Cardiovascular;  Laterality: N/A;  . DIALYSIS/PERMA CATHETER REMOVAL N/A 01/06/2019   Procedure: DIALYSIS/PERMA CATHETER REMOVAL;  Surgeon: Algernon Huxley, MD;  Location: [place] [facility];  Service: Cardiovascular;  Laterality: N/A;  . ELECTROPHYSIOLOGIC STUDY N/A 10/07/2015   Procedure: CARDIOVERSION;  Surgeon: Minna Merritts, MD;  Location: [facility] ORS;  Service: Cardiovascular;  Laterality: N/A;  . ELECTROPHYSIOLOGIC STUDY N/A 04/25/2016   Procedure: Cardioversion;  Surgeon: Minna Merritts, MD;  Location: [facility] ORS;  Service: Cardiovascular;  Laterality: N/A;  . ELECTROPHYSIOLOGIC STUDY N/A 07/18/2016   Procedure: Atrial Fibrillation Ablation;  Surgeon: Thompson Grayer, MD;  Location: [place] [facility] LAB;  Service: Cardiovascular;  Laterality: N/A;  . IMPLANTABLE CARDIOVERTER DEFIBRILLATOR IMPLANT  2005   [employer]   . LEFT HEART CATH AND CORS/GRAFTS ANGIOGRAPHY N/A 11/20/2018   Procedure: LEFT HEART CATH AND CORS/GRAFTS ANGIOGRAPHY;  Surgeon: Minna Merritts, MD;  Location: [place] [facility] LAB;  Service: Cardiovascular;  Laterality: N/A;  . TEE WITHOUT CARDIOVERSION N/A 07/18/2016    Procedure: TRANSESOPHAGEAL ECHOCARDIOGRAM (TEE);  Surgeon: Sueanne Margarita, MD;  Location: [facility] ENDOSCOPY;  Service: Cardiovascular;  Laterality: N/A;  . TEE WITHOUT CARDIOVERSION N/A 10/09/2016   Procedure: TRANSESOPHAGEAL ECHOCARDIOGRAM (TEE);  Surgeon: Lelon Perla, MD;  Location: [facility] ENDOSCOPY;  Service: Cardiovascular;  Laterality: N/A;    Prior to Admission medications   Medication Sig Start Date End Date Taking? Authorizing Provider  amitriptyline (ELAVIL) 25 MG tablet Take 25 mg by mouth at bedtime.     [provider]  amLODipine (NORVASC) 10 MG tablet TAKE 1 TABLET BY MOUTH DAILY 11/25/18   Minna Merritts, MD  apixaban (ELIQUIS) 5 MG TABS tablet Take 1 tablet (5 mg total) by mouth 2 (two) times daily. 11/01/18   Deboraha Sprang, MD  atorvastatin (LIPITOR) 40 MG tablet Take 40 mg by mouth at bedtime.     [provider]  Cholecalciferol (HM VITAMIN D3) 100 MCG (4000 UT) CAPS Take 4,000 Units by mouth daily.     [provider]  clopidogrel (PLAVIX) 75 MG tablet Take 1 tablet (75 mg) by mouth once daily 12/26/18   Minna Merritts, MD  gabapentin (NEURONTIN) 100 MG capsule Take 100 mg  by mouth 2 (two) times daily.    [provider]  insulin aspart (NOVOLOG) 100 UNIT/ML injection Inject 5-10 Units into the skin 3 (three) times daily before meals. 10 units into the skin before breakfast then 5 units before lunch then 10 units before supper (evening meal)    [provider]  insulin glargine (LANTUS) 100 UNIT/ML injection Inject 0.15 mLs (15 Units total) into the skin 2 (two) times daily. 10/27/18   Demetrios Loll, MD  iron polysaccharides (NIFEREX) 150 MG capsule Take 150 mg by mouth daily.    [provider]  isosorbide mononitrate (IMDUR) 30 MG 24 hr tablet Take 1 tablet (30 mg total) by mouth daily for 30 days. 11/24/18 12/24/18  Ripley Fraise, PA  losartan (COZAAR) 100 MG tablet TK 1 T PO QD 11/23/18   [provider]  metoprolol  tartrate (LOPRESSOR) 25 MG tablet Take 0.5 tablets (12.5 mg total) by mouth 2 (two) times daily for 30 days. 11/23/18 12/23/18  Ripley Fraise, PA  Multiple Vitamins-Minerals (VITEYES AREDS FORMULA/LUTEIN) CAPS Take 1 capsule by mouth 2 (two) times daily.    [provider]  nitroGLYCERIN (NITROSTAT) 0.4 MG SL tablet Place 1 tablet (0.4 mg total) under the tongue every 5 (five) minutes x 3 doses as needed for chest pain. 11/23/18   Lule, Sara Chu, PA  pantoprazole (PROTONIX) 40 MG tablet Take 40 mg by mouth at bedtime. 06/19/16   [provider]  sertraline (ZOLOFT) 100 MG tablet Take 100 mg by mouth at bedtime.  06/02/15   [provider]  torsemide (DEMADEX) 20 MG tablet Take 2 tablets (40 mg total) by mouth 2 (two) times daily. 11/25/18   Theora Gianotti, NP  vitamin C (ASCORBIC ACID) 500 MG tablet Take 500 mg by mouth daily.    [provider]    Allergies Piperacillin; Piperacillin-tazobactam in dex; and Zosyn [piperacillin sod-tazobactam so]  Family History  Problem Relation Age of Onset  . CAD Mother   . Diabetes Mother   . Atrial fibrillation Mother   . Lung cancer Father   . Diabetes Father     Social History Social History   Tobacco Use  . Smoking status: Never Smoker  . Smokeless tobacco: Never Used  Substance Use Topics  . Alcohol use: No  . Drug use: No     Review of Systems  Constitutional: No fever/chills Eyes: No visual changes. No discharge ENT: No upper respiratory complaints. Cardiovascular: no chest pain. Respiratory: no cough. No SOB. Gastrointestinal: No abdominal pain.  No nausea, no vomiting.  No diarrhea.  No constipation. Patient has flank pain.  Genitourinary: Negative for dysuria. No hematuria Musculoskeletal: Negative for musculoskeletal pain. Skin: Negative for rash, abrasions, lacerations, ecchymosis. Neurological: Negative for headaches, focal weakness or  numbness.   ____________________________________________   PHYSICAL EXAM:  VITAL SIGNS: ED Triage Vitals  Enc Vitals Group     BP 02/15/19 1913 124/62     Pulse Rate 02/15/19 1913 88     Resp 02/15/19 1913 (!) 22     Temp 02/15/19 1913 98.9 F (37.2 C)     Temp Source 02/15/19 1913 Oral     SpO2 02/15/19 1913 95 %     Weight 02/15/19 1909 206 lb (93.4 kg)     Height 02/15/19 1909 5\' 8"  (1.727 m)     Head Circumference --      Peak Flow --      Pain Score 02/15/19 1908 7  Pain Loc --      Pain Edu? --      Excl. in [place]? --      Constitutional: Alert and oriented.  Eyes: Conjunctivae are normal. PERRL. EOMI. Head: Atraumatic. Cardiovascular: Normal rate, regular rhythm. Normal S1 and S2.  Good peripheral circulation. Respiratory: Patient on 3 liters.  Patient tachypneic. Lungs CTAB.  Gastrointestinal: Bowel sounds 4 quadrants. Soft and nontender to palpation. No guarding or rigidity. No palpable masses. No distention. Patient has right sided CVA tenderness.  Musculoskeletal: Full range of motion to all extremities. No gross deformities appreciated. Neurologic:  Normal speech and language. No gross focal neurologic deficits are appreciated.  Skin:  Skin is warm, dry and intact. No rash noted. Psychiatric: Mood and affect are normal. Speech and behavior are normal. Patient exhibits appropriate insight and judgement.   ____________________________________________   LABS (all labs ordered are listed, but only abnormal results are displayed)  Labs Reviewed  CBC WITH DIFFERENTIAL/PLATELET - Abnormal; Notable for the following components:      Result Value   WBC 32.6 (*)    Hemoglobin 10.9 (*)    HCT 35.5 (*)    Platelets 147 (*)    Neutro Abs 9.6 (*)    Lymphs Abs 21.7 (*)    Abs Immature Granulocytes 0.13 (*)    All other components within normal limits  COMPREHENSIVE METABOLIC PANEL - Abnormal; Notable for the following components:   Glucose, Bld 228 (*)    BUN  46 (*)    Creatinine, Ser 4.55 (*)    Calcium 8.6 (*)    Total Bilirubin 1.3 (*)    GFR calc non Af Amer 9 (*)    GFR calc Af Amer 11 (*)    All other components within normal limits  SARS CORONAVIRUS 2 (HOSPITAL ORDER, [place] [facility])  LIPASE, BLOOD  URINALYSIS, COMPLETE (UACMP) WITH MICROSCOPIC   ____________________________________________  EKG   ____________________________________________  RADIOLOGY I personally viewed and evaluated these images as part of my medical decision making, as well as reviewing the written report by the radiologist.  Dg Chest 1 View  Result Date: 02/15/2019 CLINICAL DATA:  Right shoulder pain, nausea EXAM: CHEST  1 VIEW COMPARISON:  11/21/2018 FINDINGS: Cardiomegaly status post median sternotomy with left chest multi lead pacer defibrillator. Mild, diffuse interstitial pulmonary opacity, most conspicuous in the bilateral lung bases. IMPRESSION: Cardiomegaly. Mild, diffuse interstitial pulmonary opacity, most conspicuous in the bilateral lung bases, likely edema. Electronically Signed   By: Eddie Candle M.D.   On: 02/15/2019 20:33    ____________________________________________    PROCEDURES  Procedure(s) performed:    Procedures    Medications  nystatin (MYCOSTATIN/NYSTOP) topical powder (has no administration in time range)     ____________________________________________   INITIAL IMPRESSION / ASSESSMENT AND PLAN / ED COURSE  Pertinent labs & imaging results that were available during my care of the patient were reviewed by me and considered in my medical decision making (see chart for details).  Review of the [place] CSRS was performed in accordance of the [place] prior to dispensing any controlled drugs.          Assessment and Plan:  Flank Pain:  73 year old female presents to the emergency department with right and left flank pain for the past 2 to 3 days with emesis.  Patient denies fever and chills.  On  physical patient is tachypneic without tachycardia.  She is satting at 95% on 3 L.  She seems  uncomfortable.  Differential diagnosis included pyelonephritis, nephrolithiasis, aortic dissection, aneurysm, pancreatitis...  Labs pending at this time.  Care was transitioned to attending Dr. Joni Fears who assumed patient care     ____________________________________________  FINAL CLINICAL IMPRESSION(S) / ED DIAGNOSES  Final diagnoses:  None      NEW MEDICATIONS STARTED DURING THIS VISIT:  ED Discharge Orders    None          This chart was dictated using voice recognition software/Dragon. Despite best efforts to proofread, errors can occur which can change the meaning. Any change was purely unintentional.    Karren Cobble 02/15/19 2107    Carrie Mew, MD 02/15/19 2225    Carrie Mew, MD 02/15/19 2225

## 2019-02-16 ENCOUNTER — Other Ambulatory Visit: Payer: Self-pay

## 2019-02-16 DIAGNOSIS — Z79899 Other long term (current) drug therapy: Secondary | ICD-10-CM | POA: Diagnosis not present

## 2019-02-16 DIAGNOSIS — E785 Hyperlipidemia, unspecified: Secondary | ICD-10-CM | POA: Diagnosis not present

## 2019-02-16 DIAGNOSIS — R310 Gross hematuria: Secondary | ICD-10-CM | POA: Diagnosis not present

## 2019-02-16 DIAGNOSIS — I5022 Chronic systolic (congestive) heart failure: Secondary | ICD-10-CM | POA: Diagnosis not present

## 2019-02-16 DIAGNOSIS — I4819 Other persistent atrial fibrillation: Secondary | ICD-10-CM | POA: Diagnosis not present

## 2019-02-16 DIAGNOSIS — Z1159 Encounter for screening for other viral diseases: Secondary | ICD-10-CM | POA: Diagnosis not present

## 2019-02-16 DIAGNOSIS — I255 Ischemic cardiomyopathy: Secondary | ICD-10-CM | POA: Diagnosis not present

## 2019-02-16 DIAGNOSIS — N3001 Acute cystitis with hematuria: Secondary | ICD-10-CM | POA: Diagnosis not present

## 2019-02-16 DIAGNOSIS — N186 End stage renal disease: Secondary | ICD-10-CM | POA: Diagnosis not present

## 2019-02-16 DIAGNOSIS — I2510 Atherosclerotic heart disease of native coronary artery without angina pectoris: Secondary | ICD-10-CM | POA: Diagnosis not present

## 2019-02-16 DIAGNOSIS — I132 Hypertensive heart and chronic kidney disease with heart failure and with stage 5 chronic kidney disease, or end stage renal disease: Secondary | ICD-10-CM | POA: Diagnosis not present

## 2019-02-16 DIAGNOSIS — D631 Anemia in chronic kidney disease: Secondary | ICD-10-CM | POA: Diagnosis not present

## 2019-02-16 DIAGNOSIS — R109 Unspecified abdominal pain: Secondary | ICD-10-CM | POA: Diagnosis present

## 2019-02-16 DIAGNOSIS — N3080 Other cystitis without hematuria: Secondary | ICD-10-CM | POA: Diagnosis not present

## 2019-02-16 DIAGNOSIS — C9111 Chronic lymphocytic leukemia of B-cell type in remission: Secondary | ICD-10-CM | POA: Diagnosis not present

## 2019-02-16 DIAGNOSIS — D696 Thrombocytopenia, unspecified: Secondary | ICD-10-CM | POA: Diagnosis not present

## 2019-02-16 DIAGNOSIS — Z794 Long term (current) use of insulin: Secondary | ICD-10-CM | POA: Diagnosis not present

## 2019-02-16 DIAGNOSIS — Z89512 Acquired absence of left leg below knee: Secondary | ICD-10-CM | POA: Diagnosis not present

## 2019-02-16 DIAGNOSIS — Z833 Family history of diabetes mellitus: Secondary | ICD-10-CM | POA: Diagnosis not present

## 2019-02-16 DIAGNOSIS — Z951 Presence of aortocoronary bypass graft: Secondary | ICD-10-CM | POA: Diagnosis not present

## 2019-02-16 DIAGNOSIS — E1122 Type 2 diabetes mellitus with diabetic chronic kidney disease: Secondary | ICD-10-CM | POA: Diagnosis not present

## 2019-02-16 DIAGNOSIS — Z90710 Acquired absence of both cervix and uterus: Secondary | ICD-10-CM | POA: Diagnosis not present

## 2019-02-16 DIAGNOSIS — Z7902 Long term (current) use of antithrombotics/antiplatelets: Secondary | ICD-10-CM | POA: Diagnosis not present

## 2019-02-16 DIAGNOSIS — E1151 Type 2 diabetes mellitus with diabetic peripheral angiopathy without gangrene: Secondary | ICD-10-CM | POA: Diagnosis not present

## 2019-02-16 DIAGNOSIS — Z89511 Acquired absence of right leg below knee: Secondary | ICD-10-CM | POA: Diagnosis not present

## 2019-02-16 DIAGNOSIS — Z7901 Long term (current) use of anticoagulants: Secondary | ICD-10-CM | POA: Diagnosis not present

## 2019-02-16 DIAGNOSIS — Z955 Presence of coronary angioplasty implant and graft: Secondary | ICD-10-CM | POA: Diagnosis not present

## 2019-02-16 LAB — CBC
HCT: 31.8 % — ABNORMAL LOW (ref 36.0–46.0)
Hemoglobin: 9.7 g/dL — ABNORMAL LOW (ref 12.0–15.0)
MCH: 27.5 pg (ref 26.0–34.0)
MCHC: 30.5 g/dL (ref 30.0–36.0)
MCV: 90.1 fL (ref 80.0–100.0)
Platelets: 152 10*3/uL (ref 150–400)
RBC: 3.53 MIL/uL — ABNORMAL LOW (ref 3.87–5.11)
RDW: 15.7 % — ABNORMAL HIGH (ref 11.5–15.5)
WBC: 25.1 10*3/uL — ABNORMAL HIGH (ref 4.0–10.5)
nRBC: 0 % (ref 0.0–0.2)

## 2019-02-16 LAB — BASIC METABOLIC PANEL
Anion gap: 11 (ref 5–15)
BUN: 52 mg/dL — ABNORMAL HIGH (ref 8–23)
CO2: 25 mmol/L (ref 22–32)
Calcium: 8.3 mg/dL — ABNORMAL LOW (ref 8.9–10.3)
Chloride: 98 mmol/L (ref 98–111)
Creatinine, Ser: 4.78 mg/dL — ABNORMAL HIGH (ref 0.44–1.00)
GFR calc Af Amer: 10 mL/min — ABNORMAL LOW (ref >60)
GFR calc non Af Amer: 9 mL/min — ABNORMAL LOW (ref >60)
Glucose, Bld: 187 mg/dL — ABNORMAL HIGH (ref 70–99)
Potassium: 4.9 mmol/L (ref 3.5–5.1)
Sodium: 134 mmol/L — ABNORMAL LOW (ref 135–145)

## 2019-02-16 LAB — GLUCOSE, CAPILLARY
Glucose-Capillary: 160 mg/dL — ABNORMAL HIGH (ref 70–99)
Glucose-Capillary: 174 mg/dL — ABNORMAL HIGH (ref 70–99)
Glucose-Capillary: 151 mg/dL — ABNORMAL HIGH (ref 70–99)
Glucose-Capillary: 170 mg/dL — ABNORMAL HIGH (ref 70–99)
Glucose-Capillary: 125 mg/dL — ABNORMAL HIGH (ref 70–99)
Glucose-Capillary: 130 mg/dL — ABNORMAL HIGH (ref 70–99)

## 2019-02-16 MED ORDER — GABAPENTIN 100 MG PO CAPS
100.0000 mg | ORAL_CAPSULE | Freq: Two times a day (BID) | ORAL | Status: DC
  Administered 2019-02-16 – 2019-02-18 (×4): 100 mg via ORAL
  Filled 2019-02-16 (×4): qty 1

## 2019-02-16 MED ORDER — MAGNESIUM HYDROXIDE 400 MG/5ML PO SUSP: 30.0000 mL | Freq: Every day | ORAL | Status: DC | PRN

## 2019-02-16 MED ORDER — ONDANSETRON HCL 4 MG PO TABS: 4.0000 mg | ORAL_TABLET | Freq: Four times a day (QID) | ORAL | Status: DC | PRN

## 2019-02-16 MED ORDER — SODIUM CHLORIDE 0.9 % IV SOLN
INTRAVENOUS | Status: DC
  Administered 2019-02-16: 01:00:00 via INTRAVENOUS

## 2019-02-16 MED ORDER — ENOXAPARIN SODIUM 40 MG/0.4ML ~~LOC~~ SOLN: 40.0000 mg | SUBCUTANEOUS | Status: DC

## 2019-02-16 MED ORDER — HEPARIN 1000 UNIT/ML FOR PERITONEAL DIALYSIS
500.0000 [IU] | INTRAMUSCULAR | Status: DC | PRN
  Filled 2019-02-16: qty 0.5

## 2019-02-16 MED ORDER — PROMETHAZINE HCL 25 MG/ML IJ SOLN
12.5000 mg | Freq: Once | INTRAMUSCULAR | Status: AC
  Administered 2019-02-16: 12.5 mg via INTRAVENOUS
  Filled 2019-02-16: qty 1

## 2019-02-16 MED ORDER — DELFLEX-LC/2.5% DEXTROSE 394 MOSM/L IP SOLN
INTRAPERITONEAL | Status: DC
  Filled 2019-02-16: qty 3000

## 2019-02-16 MED ORDER — GENTAMICIN SULFATE 0.1 % EX CREA
1.0000 "application " | TOPICAL_CREAM | Freq: Every day | CUTANEOUS | Status: DC
  Administered 2019-02-16 – 2019-02-18 (×2): 1 via TOPICAL
  Filled 2019-02-16: qty 15

## 2019-02-16 MED ORDER — ACETAMINOPHEN 650 MG RE SUPP: 650.0000 mg | Freq: Four times a day (QID) | RECTAL | Status: DC | PRN

## 2019-02-16 MED ORDER — INSULIN ASPART 100 UNIT/ML ~~LOC~~ SOLN
0.0000 [IU] | Freq: Three times a day (TID) | SUBCUTANEOUS | Status: DC
  Administered 2019-02-16 (×2): 3 [IU] via SUBCUTANEOUS
  Administered 2019-02-16: 2 [IU] via SUBCUTANEOUS
  Administered 2019-02-17: 8 [IU] via SUBCUTANEOUS
  Administered 2019-02-17: 3 [IU] via SUBCUTANEOUS
  Administered 2019-02-17: 5 [IU] via SUBCUTANEOUS
  Administered 2019-02-18: 8 [IU] via SUBCUTANEOUS
  Administered 2019-02-18: 5 [IU] via SUBCUTANEOUS
  Filled 2019-02-16 (×8): qty 1

## 2019-02-16 MED ORDER — ONDANSETRON HCL 4 MG/2ML IJ SOLN
4.0000 mg | Freq: Four times a day (QID) | INTRAMUSCULAR | Status: DC | PRN
  Administered 2019-02-16 – 2019-02-17 (×5): 4 mg via INTRAVENOUS
  Filled 2019-02-16 (×5): qty 2

## 2019-02-16 MED ORDER — APIXABAN 5 MG PO TABS
5.0000 mg | ORAL_TABLET | Freq: Two times a day (BID) | ORAL | Status: DC
  Administered 2019-02-16 – 2019-02-18 (×4): 5 mg via ORAL
  Filled 2019-02-16 (×4): qty 1

## 2019-02-16 MED ORDER — ACETAMINOPHEN 325 MG PO TABS
650.0000 mg | ORAL_TABLET | Freq: Four times a day (QID) | ORAL | Status: DC | PRN
  Administered 2019-02-16 (×2): 650 mg via ORAL
  Filled 2019-02-16 (×2): qty 2

## 2019-02-16 MED ORDER — SODIUM CHLORIDE 0.9 % IV SOLN
1.0000 g | INTRAVENOUS | Status: DC
  Administered 2019-02-16 – 2019-02-17 (×2): 1 g via INTRAVENOUS
  Filled 2019-02-16 (×2): qty 1
  Filled 2019-02-16: qty 10

## 2019-02-16 MED ORDER — INSULIN ASPART 100 UNIT/ML ~~LOC~~ SOLN: 0.0000 [IU] | Freq: Every day | SUBCUTANEOUS | Status: DC

## 2019-02-16 NOTE — Progress Notes (Signed)
Attempted to call patient's daughter Natale Milch but she did not answer

## 2019-02-16 NOTE — Progress Notes (Signed)
Primary nurse noted that pt was a dialysis pt and orders were for NS @ 100. Primary nurse paged and spoke to Dr. Sidney Ace. Orders to be reduced to 60 ML. Orders also received for a moderate Sliding scale for insulin. Primary nurse to continue to monitor.

## 2019-02-16 NOTE — Progress Notes (Signed)
CCPD Tx Start  Dressing Changed, exit site shows no s/s infection.  10 hour tx will have patient set to disconnect at 8am.    02/16/19 2210  Peritoneal Catheter Left lower abdomen Continuous ambulatory  Placement Date/Time: 10/25/18 1350   Procedural Verification: Medical records & consent reviewed  Time out: Correct Patient;Correct Site;Correct Procedure;Special equipment/requirements available  Person Inserting Catheter: Dr. Delana Meyer  Catheter Locat...  Site Assessment Clean;Dry;Intact  Drainage Description None  Catheter status Accessed  Dressing Gauze/Drain sponge  Dressing Status Clean;Dry;Intact  Dressing Intervention Dressing changed  Cycler Setup  Total Number of Exchanges 4  Fill Volume 2000  Dianeal Solution Dextrose 2.5% in 6000 mL  Last Fill Volume 0  Fill Time - Minute(s) 10  Dwell Time - Hour(s) 10  Drain Time - Minute(s) 20 mins  Completion  Exit Site Care Performed Yes  Cell Count on Daytime Exchange Sent  Education / Care Plan  Dialysis Education Provided Yes  Documented Education in Care Plan Yes  Hand-Off documentation  Report given to (Full Name) [name] Picker RN  Report received from (Full Name) Trellis Paganini RN

## 2019-02-16 NOTE — H&P (Signed)
[facility] [place] NAME: Caroline Hernandez    MR#:  [id number]  Divide:  August 30, 1946  DATE OF ADMISSION:  02/15/2019  PRIMARY CARE PHYSICIAN: System, Pcp Not In   REQUESTING/REFERRING PHYSICIAN: Vallarie Mare, PA-C/[place], Arnette Norris, MD CHIEF COMPLAINT:   Chief Complaint  Patient presents with  . Shoulder Pain  . Flank Pain    HISTORY OF PRESENT ILLNESS:  Caroline Hernandez  is a 73 y.o. Caucasian female with a known history of multiple medical problems that would be mentioned below, who presented to the emergency room with onset of left-sided flank and right-sided lower back pain with associated vomiting most of the day today without fever or chills.  She has been feeling bad over the last 3 days.  She admits to dyspnea and has used her home oxygen yesterday.  No chest pain or palpitations cough or wheezing.  No rhinorrhea or nasal congestion or sore throat.  No dysuria, oliguria or hematuria or flank pain.  Upon presentation to the emergency room, temperature was 98.9 respiratory rate was 22 and pulse ox and she was 95% on 3 L of O2 by nasal cannula with a blood pressure 124/62 and pulse of 88.  Labs are remarkable for leukocytosis of 32.6 consistent with her CLL that is actually in remission with anemia better than previous levels.  CMP is remarkable for blood glucose of 228, BUN of 46 and creatinine of 4.55 with history of end-stage renal disease on peritoneal dialysis.  COVID-19 test came back negative.  EKG showed atrial fibrillation with controlled ventricular spots of 84 with nonspecific intraventricular conduction delay, left axis deviation and Q waves in V1 and V2.  The patient was given 2 g of IV cefepime as well as p.o. Ultram.  She will be admitted to a medically monitored bed for further evaluation and management. PAST MEDICAL HISTORY:   Past Medical History:  Diagnosis Date  . Amputation of left lower extremity below knee ([place])    . Amputation of right lower extremity below knee ([place])   . CAD (coronary artery disease)    a. 2004 Cardiac Arrest/CABG x 3 (LIMA->LAD, VG->OM, VG->RCA);  b. 06/2015 lexiscan MV: no significant ischemia, EF 48%, low risk->Med Rx.  . Chronic combined systolic and diastolic CHF (congestive heart failure) ([place])    a. 12/2014 Echo: EF 45-50%;  b. 08/2015 Echo: EF 45-50%, ant, antsept HK, mildly dil LA, nl RV, mild-mod TR, sev PAH (59mmHg); b. 08/2017 TEE: EF 40-45%, large PFO w/ L->R shunting.  . CKD (chronic kidney disease), stage IV ([place])   . CLL (chronic lymphocytic leukemia) ([place])   . Diabetes mellitus without complication ([place])   . Essential hypertension   . GERD (gastroesophageal reflux disease)   . Hiatal hernia   . History of cardiac arrest    a. 2004.  Marland Kitchen Hyperlipidemia   . Ischemic cardiomyopathy    a. s/p MDT ICD (originally had 0160 lead-->gen change and lead revision ~ 2012 @ [place]); b. 12/2014 Echo: EF 45-50%;  c.08/2015 Echo: EF 45-50%; d. 08/2017 Echo: EF 40-45%.  Marland Kitchen PAD (peripheral artery disease) ([facility])    a. s/p bilat BKA  . Persistent atrial fibrillation    a. Dx 12/2014.  CHA2DS2VASc = 6--> warfarin;  b. 09/2015 s/p DCCV-->on amio; c. s/p DCCV 04/25/16; d. 07/2016 s/p RFCA/PVI in setting of recurrent afib despite amio; e. 05/2018 Recurrent afib.    PAST SURGICAL HISTORY:   Past Surgical History:  Procedure Laterality Date  . ABDOMINAL HYSTERECTOMY    . Amputation lower extremity bilaterally Bilateral   . CAPD INSERTION N/A 10/25/2018   Procedure: LAPAROSCOPIC INSERTION CONTINUOUS AMBULATORY PERITONEAL DIALYSIS  (CAPD) CATHETER;  Surgeon: Katha Cabal, MD;  Location: [facility] ORS;  Service: Vascular;  Laterality: N/A;  . CARDIAC CATHETERIZATION    . CARDIOVERSION N/A 10/09/2016   Procedure: CARDIOVERSION;  Surgeon: Lelon Perla, MD;  Location: [facility] ENDOSCOPY;  Service: Cardiovascular;  Laterality: N/A;  . CORONARY ARTERY BYPASS GRAFT    . CORONARY STENT INTERVENTION N/A  11/20/2018   Procedure: CORONARY STENT INTERVENTION;  Surgeon: Isaias Cowman, MD;  Location: [place] CV LAB;  Service: Cardiovascular;  Laterality: N/A;  SVG- OM  . DIALYSIS/PERMA CATHETER INSERTION N/A 10/22/2018   Procedure: DIALYSIS/PERMA CATHETER INSERTION;  Surgeon: Katha Cabal, MD;  Location: [place] [facility];  Service: Cardiovascular;  Laterality: N/A;  . DIALYSIS/PERMA CATHETER REMOVAL N/A 01/06/2019   Procedure: DIALYSIS/PERMA CATHETER REMOVAL;  Surgeon: Algernon Huxley, MD;  Location: [place] [facility] LAB;  Service: Cardiovascular;  Laterality: N/A;  . ELECTROPHYSIOLOGIC STUDY N/A 10/07/2015   Procedure: CARDIOVERSION;  Surgeon: Minna Merritts, MD;  Location: [facility] ORS;  Service: Cardiovascular;  Laterality: N/A;  . ELECTROPHYSIOLOGIC STUDY N/A 04/25/2016   Procedure: Cardioversion;  Surgeon: Minna Merritts, MD;  Location: [facility] ORS;  Service: Cardiovascular;  Laterality: N/A;  . ELECTROPHYSIOLOGIC STUDY N/A 07/18/2016   Procedure: Atrial Fibrillation Ablation;  Surgeon: Thompson Grayer, MD;  Location: [place] [facility] LAB;  Service: Cardiovascular;  Laterality: N/A;  . IMPLANTABLE CARDIOVERTER DEFIBRILLATOR IMPLANT  2005   [employer]   . LEFT HEART CATH AND CORS/GRAFTS ANGIOGRAPHY N/A 11/20/2018   Procedure: LEFT HEART CATH AND CORS/GRAFTS ANGIOGRAPHY;  Surgeon: Minna Merritts, MD;  Location: [place] [facility] LAB;  Service: Cardiovascular;  Laterality: N/A;  . TEE WITHOUT CARDIOVERSION N/A 07/18/2016   Procedure: TRANSESOPHAGEAL ECHOCARDIOGRAM (TEE);  Surgeon: Sueanne Margarita, MD;  Location[facility] ENDOSCOPY;  Service: Cardiovascular;  Laterality: N/A;  . TEE WITHOUT CARDIOVERSION N/A 10/09/2016   Procedure: TRANSESOPHAGEAL ECHOCARDIOGRAM (TEE);  Surgeon: Lelon Perla, MD;  Location[facility] ENDOSCOPY;  Service: Cardiovascular;  Laterality: N/A;    SOCIAL HISTORY:   Social History   Tobacco Use  . Smoking status: Never Smoker  . Smokeless tobacco: Never Used  Substance  Use Topics  . Alcohol use: No    FAMILY HISTORY:   Family History  Problem Relation Age of Onset  . CAD Mother   . Diabetes Mother   . Atrial fibrillation Mother   . Lung cancer Father   . Diabetes Father     DRUG ALLERGIES:   Allergies  Allergen Reactions  . Piperacillin Other (See Comments)    Renal failure  . 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 See ID note**PER PT-CAUSED RENAL FAILURE**  . Zosyn [Piperacillin Sod-Tazobactam So] Other (See Comments)    Renal failure    REVIEW OF SYSTEMS:   ROS As per history of present illness. All pertinent systems were reviewed above. Constitutional,  HEENT, cardiovascular, respiratory, GI, GU, musculoskeletal, neuro, psychiatric, endocrine,  integumentary and hematologic systems were reviewed and are otherwise  negative/unremarkable except for positive findings mentioned above in the HPI.   MEDICATIONS AT HOME:   Prior to Admission medications   Medication Sig Start Date End Date Taking? Authorizing Provider  amitriptyline (ELAVIL) 25 MG tablet Take 25 mg by mouth at bedtime.    Yes  [provider]  amLODipine (NORVASC) 10 MG tablet TAKE 1 TABLET BY MOUTH DAILY 11/25/18  Yes Gollan, Kathlene November, MD  apixaban (ELIQUIS) 5 MG TABS tablet Take 1 tablet (5 mg total) by mouth 2 (two) times daily. 11/01/18  Yes Deboraha Sprang, MD  atorvastatin (LIPITOR) 40 MG tablet Take 40 mg by mouth at bedtime.    Yes [provider]  Cholecalciferol (HM VITAMIN D3) 100 MCG (4000 UT) CAPS Take 4,000 Units by mouth daily.    Yes [provider]  clopidogrel (PLAVIX) 75 MG tablet Take 1 tablet (75 mg) by mouth once daily 12/26/18  Yes Gollan, Kathlene November, MD  gabapentin (NEURONTIN) 100 MG capsule Take 100 mg by mouth 2 (two) times daily.   Yes [provider]  insulin aspart (NOVOLOG) 100 UNIT/ML injection Inject 5-10 Units into the skin 3 (three) times daily before meals. 10 units  into the skin before breakfast then 5 units before lunch then 10 units before supper (evening meal)   Yes [provider]  insulin glargine (LANTUS) 100 UNIT/ML injection Inject 0.15 mLs (15 Units total) into the skin 2 (two) times daily. 10/27/18  Yes Demetrios Loll, MD  iron polysaccharides (NIFEREX) 150 MG capsule Take 150 mg by mouth daily.   Yes [provider]  isosorbide mononitrate (IMDUR) 30 MG 24 hr tablet Take 1 tablet (30 mg total) by mouth daily for 30 days. 11/24/18 02/15/19 Yes Lule, Joana, PA  losartan (COZAAR) 100 MG tablet TK 1 T PO QD 11/23/18  Yes [provider]  metoprolol tartrate (LOPRESSOR) 25 MG tablet Take 0.5 tablets (12.5 mg total) by mouth 2 (two) times daily for 30 days. 11/23/18 02/15/19 Yes Lule, Joana, PA  Multiple Vitamins-Minerals (VITEYES AREDS FORMULA/LUTEIN) CAPS Take 1 capsule by mouth 2 (two) times daily.   Yes [provider]  pantoprazole (PROTONIX) 40 MG tablet Take 40 mg by mouth at bedtime. 06/19/16  Yes [provider]  sertraline (ZOLOFT) 100 MG tablet Take 100 mg by mouth at bedtime.  06/02/15  Yes [provider]  torsemide (DEMADEX) 20 MG tablet Take 2 tablets (40 mg total) by mouth 2 (two) times daily. 11/25/18  Yes Theora Gianotti, NP  vitamin C (ASCORBIC ACID) 500 MG tablet Take 500 mg by mouth daily.   Yes [provider]  nitroGLYCERIN (NITROSTAT) 0.4 MG SL tablet Place 1 tablet (0.4 mg total) under the tongue every 5 (five) minutes x 3 doses as needed for chest pain. 11/23/18   Ripley Fraise, PA      VITAL SIGNS:  Blood pressure 121/72, pulse 78, temperature 98.9 F (37.2 C), temperature source Oral, resp. rate (!) 22, height 5\' 8"  (1.727 m), weight 93.4 kg, SpO2 100 %.  PHYSICAL EXAMINATION:  Physical Exam  GENERAL:  73 y.o.-year-old Caucasian female patient lying in the bed with no acute distress.  EYES: Pupils equal, round, reactive to light and accommodation. No scleral icterus.  Extraocular muscles intact.  HEENT: Head atraumatic, normocephalic. Oropharynx and nasopharynx clear.  NECK:  Supple, no jugular venous distention. No thyroid enlargement, no tenderness.  LUNGS: Normal breath sounds bilaterally, no wheezing, rales,rhonchi or crepitation. No use of accessory muscles of respiration.  CARDIOVASCULAR: Regular rate and rhythm, S1, S2 normal. No murmurs, rubs, or gallops.  ABDOMEN: Soft, nondistended, nontender. Bowel sounds present. No organomegaly or mass.  She has bilateral CVA tenderness. EXTREMITIES: She has bilateral below-knee amputation with intact stumps.   NEUROLOGIC: Cranial nerves II through XII  are intact. Muscle strength 5/5 in all extremities. Sensation intact. Gait not checked.  PSYCHIATRIC: The patient is alert and oriented x 3.  Normal affect and good eye contact. SKIN: No obvious rash, lesion, or ulcer.   LABORATORY PANEL:   CBC Recent Labs  Lab 02/15/19 1948  WBC 32.6*  HGB 10.9*  HCT 35.5*  PLT 147*   ------------------------------------------------------------------------------------------------------------------  Chemistries  Recent Labs  Lab 02/15/19 1948  NA 136  K 4.4  CL 99  CO2 25  GLUCOSE 228*  BUN 46*  CREATININE 4.55*  CALCIUM 8.6*  AST 26  ALT 10  ALKPHOS 77  BILITOT 1.3*   ------------------------------------------------------------------------------------------------------------------  Cardiac Enzymes No results for input(s): TROPONINI in the last 168 hours. ------------------------------------------------------------------------------------------------------------------  RADIOLOGY:  Dg Chest 1 View  Result Date: 02/15/2019 CLINICAL DATA:  Right shoulder pain, nausea EXAM: CHEST  1 VIEW COMPARISON:  11/21/2018 FINDINGS: Cardiomegaly status post median sternotomy with left chest multi lead pacer defibrillator. Mild, diffuse interstitial pulmonary opacity, most conspicuous in the bilateral lung bases.  IMPRESSION: Cardiomegaly. Mild, diffuse interstitial pulmonary opacity, most conspicuous in the bilateral lung bases, likely edema. Electronically Signed   By: Eddie Candle M.D.   On: 02/15/2019 20:33   Ct Renal Stone Study  Result Date: 02/15/2019 CLINICAL DATA:  Bilateral flank pain EXAM: CT ABDOMEN AND PELVIS WITHOUT CONTRAST TECHNIQUE: Multidetector CT imaging of the abdomen and pelvis was performed following the standard protocol without IV contrast. COMPARISON:  CT chest, 03/07/2018 FINDINGS: Lower chest: Small bilateral pleural effusions and associated atelectasis or consolidation. Scattered ground-glass opacities of the included lung bases. Cardiomegaly and coronary artery calcifications. Partially imaged pacer defibrillator leads Hepatobiliary: No solid liver abnormality is seen. Small gallstones in the dependent gallbladder. Gallbladder wall thickening, or biliary dilatation. Pancreas: Unremarkable. No pancreatic ductal dilatation or surrounding inflammatory changes. Spleen: Normal in size without significant abnormality. Adrenals/Urinary Tract: Adrenal glands are unremarkable. Kidneys are normal, without renal calculi, solid lesion, or hydronephrosis. There is bladder wall emphysema and gas within the small vessels about the bladder (series 2, image 79). Stomach/Bowel: Stomach is within normal limits. Appendix appears normal. No evidence of bowel wall thickening, distention, or inflammatory changes. Vascular/Lymphatic: Pipelike calcific atherosclerosis and vascular calcinosis of the aorta and branch vessels. There are enlarged bilateral inguinal, iliac, pelvic sidewall, and retroperitoneal lymph nodes. Reproductive: No mass or other significant abnormality. Other: No abdominal wall hernia or abnormality. Trace ascites. There is a Tenckhoff type peritoneal dialysis catheter, positioned in the left paracolic gutter. Musculoskeletal: No acute or significant osseous findings. IMPRESSION: 1. There is  bladder wall emphysema and gas within the small vessels about the bladder (series 2, image 79). 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 Numerous enlarged abdominopelvic lymph nodes, likely reactive. 2. Trace ascites, likely peritoneal dialysate. There is a Tenckhoff type peritoneal dialysis catheter, positioned in the left paracolic gutter. 3. Small bilateral pleural effusions and associated atelectasis or consolidation. Scattered infectious or inflammatory ground-glass opacities of the included lung bases. 4.  Other chronic and incidental findings as detailed above. Electronically Signed   By: Eddie Candle M.D.   On: 02/15/2019 21:43      IMPRESSION AND PLAN:  1.  UTI that could be associated with early pyelonephritis.  The patient will be admitted to the medical bed.  We will continue antibiotic therapy with IV Rocephin and follow urine culture and sensitivity.  2.  End-stage renal disease on peritoneal dialysis.  Nephrology follow-up consultation will be obtained.  3.  Atrial fibrillation  with controlled ventricular response.  We will continue Eliquis and Lopressor.  4.  Coronary artery disease.  We will continue Lopressor and Imdur as well as Plavix.  5.  Dyslipidemia.  Statin therapy will be resumed.  6.  Hypertension.  Continue amlodipine and Lopressor.  7.  DVT prophylaxis.  Continue Eliquis.  All the records are reviewed and case discussed with ED provider. The plan of care was discussed in details with the patient (and family). I answered all questions. The patient agreed to proceed with the above mentioned plan. Further management will depend upon hospital course.   CODE STATUS: Full code  TOTAL TIME TAKING CARE OF THIS PATIENT: 50 minutes.    Christel Mormon M.D on 02/16/2019 at 12:57 AM  Pager - [phone]  After 6pm go to [email or web address] - Proofreader  [facility]  Office  [phone]  CC: Primary care physician; System,  Pcp Not In   Note: This dictation was prepared with Dragon dictation along with smaller phrase technology. Any transcriptional errors that result from this process are unintentional.

## 2019-02-16 NOTE — Progress Notes (Signed)
[facility], [place] 02/16/19  Subjective:   Admitted for rt flank pain radiating to lower abdomen No fever, no cough 2 weeks ago, noticed blood in urine- bright. Red. Since then has some back stains on underwear CT of abdomen shows emphysematous cystitis Started on cefepime and ceftriaxone  Objective:  Vital signs in last 24 hours:  Temp:  [98.5 F (36.9 C)-99.7 F (37.6 C)] 98.5 F (36.9 C) (06/07 0534) Pulse Rate:  [66-88] 66 (06/07 0534) Resp:  [19-24] 20 (06/07 0534) BP: (106-124)/(62-72) 112/63 (06/07 0534) SpO2:  [91 %-100 %] 94 % (06/07 0534) Weight:  [93.4 kg-106.4 kg] 106.4 kg (06/07 0104)  Weight change:  Filed Weights   02/15/19 1909 02/16/19 0104  Weight: 93.4 kg 106.4 kg    Intake/Output:    Intake/Output Summary (Last 24 hours) at 02/16/2019 0950 Last data filed at 02/16/2019 0539 Gross per 24 hour  Intake 238.73 ml  Output 300 ml  Net -61.27 ml     Physical Exam: General:  No acute distress, sitting up in bed  HEENT  anicteric, moist oral mucous membranes  Neck  supple,  Pulm/lungs  O2, b/l crackles  CVS/Heart  irregular, no rub  Abdomen:   Soft, obese, nontender  Extremities: B/l BKA  Neurologic:  Alert, oriented  Skin:  No acute rashes  Access:  PD catheter in place, nontender       Basic Metabolic Panel:  Recent Labs  Lab 02/15/19 1948 02/16/19 0427  NA 136 134*  K 4.4 4.9  CL 99 98  CO2 25 25  GLUCOSE 228* 187*  BUN 46* 52*  CREATININE 4.55* 4.78*  CALCIUM 8.6* 8.3*     CBC: Recent Labs  Lab 02/15/19 1948 02/16/19 0427  WBC 32.6* 25.1*  NEUTROABS 9.6*  --   HGB 10.9* 9.7*  HCT 35.5* 31.8*  MCV 91.0 90.1  PLT 147* 152      Lab Results  Component Value Date   HEPBSAG Negative 11/20/2018   HEPBSAB Non Reactive 10/22/2018   HEPBIGM Negative 10/22/2018      Microbiology:  Recent Results (from the past 240 hour(s))  SARS Coronavirus 2 (CEPHEID - Performed in [place] hospital  lab), Hosp Order     Status: None   Collection Time: 02/15/19  8:44 PM  Result Value Ref Range Status   SARS Coronavirus 2 NEGATIVE NEGATIVE Final    Comment: (NOTE) If result is NEGATIVE SARS-CoV-2 target nucleic acids are NOT DETECTED. The SARS-CoV-2 RNA is generally detectable in upper and lower  respiratory specimens during the acute phase of infection. The lowest  concentration of SARS-CoV-2 viral copies this assay can detect is 250  copies / mL. A negative result does not preclude SARS-CoV-2 infection  and should not be used as the sole basis for treatment or other  patient management decisions.  A negative result may occur with  improper specimen collection / handling, submission of specimen other  than nasopharyngeal swab, presence of viral mutation(s) within the  areas targeted by this assay, and inadequate number of viral copies  (<250 copies / mL). A negative result must be combined with clinical  observations, patient history, and epidemiological information. If result is POSITIVE SARS-CoV-2 target nucleic acids are DETECTED. The SARS-CoV-2 RNA is generally detectable in upper and lower  respiratory specimens dur ing the acute phase of infection.  Positive  results are indicative of active infection with SARS-CoV-2.  Clinical  correlation with patient history and other diagnostic information  is  necessary to determine patient infection status.  Positive results do  not rule out bacterial infection or co-infection with other viruses. If result is PRESUMPTIVE POSTIVE SARS-CoV-2 nucleic acids MAY BE PRESENT.   A presumptive positive result was obtained on the submitted specimen  and confirmed on repeat testing.  While 2019 novel coronavirus  (SARS-CoV-2) nucleic acids may be present in the submitted sample  additional confirmatory testing may be necessary for epidemiological  and / or clinical management purposes  to differentiate between  SARS-CoV-2 and other Sarbecovirus  currently known to infect humans.  If clinically indicated additional testing with an alternate test  methodology [phone]) is advised. The SARS-CoV-2 RNA is generally  detectable in upper and lower respiratory sp ecimens during the acute  phase of infection. The expected result is Negative. Fact Sheet for Patients:  StrictlyIdeas.no Fact Sheet for Healthcare Providers: BankingDealers.co.za This test is not yet approved or cleared by the [place] FDA and has been authorized for detection and/or diagnosis of SARS-CoV-2 by FDA under an Emergency Use Authorization (EUA).  This EUA will remain in effect (meaning this test can be used) for the duration of the COVID-19 declaration under Section 564(b)(1) of the Act, 21 U.S.C. section 360bbb-3(b)(1), unless the authorization is terminated or revoked sooner. Performed at [facility], [address] [place],  [address]     Coagulation Studies: No results for input(s): LABPROT, INR in the last 72 hours.  Urinalysis: Recent Labs    02/15/19 2044  COLORURINE RED*  LABSPEC 1.018  PHURINE TEST NOT REPORTED DUE TO COLOR INTERFERENCE OF URINE PIGMENT  GLUCOSEU TEST NOT REPORTED DUE TO COLOR INTERFERENCE OF URINE PIGMENT*  HGBUR TEST NOT REPORTED DUE TO COLOR INTERFERENCE OF URINE PIGMENT*  BILIRUBINUR TEST NOT REPORTED DUE TO COLOR INTERFERENCE OF URINE PIGMENT*  KETONESUR TEST NOT REPORTED DUE TO COLOR INTERFERENCE OF URINE PIGMENT*  PROTEINUR TEST NOT REPORTED DUE TO COLOR INTERFERENCE OF URINE PIGMENT*  NITRITE TEST NOT REPORTED DUE TO COLOR INTERFERENCE OF URINE PIGMENT*  LEUKOCYTESUR TEST NOT REPORTED DUE TO COLOR INTERFERENCE OF URINE PIGMENT*      Imaging: Dg Chest 1 View  Result Date: 02/15/2019 CLINICAL DATA:  Right shoulder pain, nausea EXAM: CHEST  1 VIEW COMPARISON:  11/21/2018 FINDINGS: Cardiomegaly status post median sternotomy with left chest multi lead  pacer defibrillator. Mild, diffuse interstitial pulmonary opacity, most conspicuous in the bilateral lung bases. IMPRESSION: Cardiomegaly. Mild, diffuse interstitial pulmonary opacity, most conspicuous in the bilateral lung bases, likely edema. Electronically Signed   By: Eddie Candle M.D.   On: 02/15/2019 20:33   Ct Renal Stone Study  Result Date: 02/15/2019 CLINICAL DATA:  Bilateral flank pain EXAM: CT ABDOMEN AND PELVIS WITHOUT CONTRAST TECHNIQUE: Multidetector CT imaging of the abdomen and pelvis was performed following the standard protocol without IV contrast. COMPARISON:  CT chest, 03/07/2018 FINDINGS: Lower chest: Small bilateral pleural effusions and associated atelectasis or consolidation. Scattered ground-glass opacities of the included lung bases. Cardiomegaly and coronary artery calcifications. Partially imaged pacer defibrillator leads Hepatobiliary: No solid liver abnormality is seen. Small gallstones in the dependent gallbladder. Gallbladder wall thickening, or biliary dilatation. Pancreas: Unremarkable. No pancreatic ductal dilatation or surrounding inflammatory changes. Spleen: Normal in size without significant abnormality. Adrenals/Urinary Tract: Adrenal glands are unremarkable. Kidneys are normal, without renal calculi, solid lesion, or hydronephrosis. There is bladder wall emphysema and gas within the small vessels about the bladder (series 2, image 79). Stomach/Bowel: Stomach is within normal limits. Appendix appears  normal. No evidence of bowel wall thickening, distention, or inflammatory changes. Vascular/Lymphatic: Pipelike calcific atherosclerosis and vascular calcinosis of the aorta and branch vessels. There are enlarged bilateral inguinal, iliac, pelvic sidewall, and retroperitoneal lymph nodes. Reproductive: No mass or other significant abnormality. Other: No abdominal wall hernia or abnormality. Trace ascites. There is a Tenckhoff type peritoneal dialysis catheter, positioned in the  left paracolic gutter. Musculoskeletal: No acute or significant osseous findings. IMPRESSION: 1. There is bladder wall emphysema and gas within the small vessels about the bladder (series 2, image 79). 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 Numerous enlarged abdominopelvic lymph nodes, likely reactive. 2. Trace ascites, likely peritoneal dialysate. There is a Tenckhoff type peritoneal dialysis catheter, positioned in the left paracolic gutter. 3. Small bilateral pleural effusions and associated atelectasis or consolidation. Scattered infectious or inflammatory ground-glass opacities of the included lung bases. 4.  Other chronic and incidental findings as detailed above. Electronically Signed   By: Eddie Candle M.D.   On: 02/15/2019 21:43     Medications:   . cefTRIAXone (ROCEPHIN)  IV     . insulin aspart  0-15 Units Subcutaneous TID WC  . insulin aspart  0-5 Units Subcutaneous QHS   acetaminophen **OR** acetaminophen, magnesium hydroxide, ondansetron **OR** ondansetron (ZOFRAN) IV  Assessment/ Plan:  73 y.o. 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  End Stage Renal Disease  -We will set up PD and obtain cell counts  2.  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 Diabetes mellitus type II with chronic kidney disease: insulin dependent. history of poor control.  Lab Results  Component Value Date   HGBA1C 9.1 (H) 03/07/2018    4.  Cardiac history: Atrial fibrillation:  -previously Amiodarone discontinued due to bradycardia Chronic systolic CHF with LVEF 35 to 40%, severe hypokinesis of septal, anteroseptal and  anterior left ventricular segments, mildly dilated left atrium, moderate aortic sclerosis, mildly elevated right ventricular systolic pressure of 36.1 mm.  Last echocardiogram October 11, 2018 -Non-STEMI November 18, 2018; cath November 19, 2017- DES placed SVG to OM1  5.  Emphysematous cystitis -IV antibiotics -Urology evaluation pending  6. [facility] Lab Results  Component Value Date   CALCIUM 8.3 (L) 02/16/2019   PHOS 2.7 10/26/2018      LOS: 0 Caroline Hernandez 6/7/20209:50 AM  [place], [place]  Note: This note was prepared with Dragon dictation. Any transcription errors are unintentional

## 2019-02-16 NOTE — Consult Note (Signed)
Consultation: Emphysematous cystitis, gross hematuria Requested by: Dr. Fritzi Mandes  History of Present Illness: Caroline Hernandez is a 73 year old female she has a history of end-stage renal disease on peritoneal dialysis, CLL, lower extremity amputation.  She was admitted with left flank and right abdominal pain for a few days as well as malaise.  A urinalysis revealed many bacteria, greater than 50 red cells and Gawthrop cells.  Patient noticed red urine a couple of weeks ago.  She has not had any dysuria or fever.  She still voids 2-3 times a day and feels like she voids "a good amount".  She does not strain to void and has a good flow.  CT scan of the abdomen and pelvis was obtained which showed air in the urine and air along and within the bladder wall.  Currently she is voiding into the pure wick but her urine is red in the canister.  Otherwise she is feeling better.  Her Royals count was 32 but that is down from 43 which is chronic, hemoglobin and hematocrit stable.  She has been afebrile with stable vitals.   She is followed by [facility] urology and underwent TURBT and bilateral retrograde pyelogram October 2019 with Dr. Brendia Sacks. 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 Heme Onc is also at [facility].   Past Medical History:  Diagnosis Date  . Amputation of left lower extremity below knee ([place])   . Amputation of right lower extremity below knee ([place])   . CAD (coronary artery disease)    a. 2004 Cardiac Arrest/CABG x 3 (LIMA->LAD, VG->OM, VG->RCA);  b. 06/2015 lexiscan MV: no significant ischemia, EF 48%, low risk->Med Rx.  . Chronic combined systolic and diastolic CHF (congestive heart failure) ([place])    a. 12/2014 Echo: EF 45-50%;  b. 08/2015 Echo: EF 45-50%, ant, antsept HK, mildly dil LA, nl RV, mild-mod TR, sev PAH (36mmHg); b. 08/2017 TEE: EF 40-45%, large PFO w/ L->R shunting.  . CKD (chronic kidney disease),  stage IV ([place])   . CLL (chronic lymphocytic leukemia) ([place])   . Diabetes mellitus without complication ([place])   . Essential hypertension   . GERD (gastroesophageal reflux disease)   . Hiatal hernia   . History of cardiac arrest    a. 2004.  Marland Kitchen Hyperlipidemia   . Ischemic cardiomyopathy    a. s/p MDT ICD (originally had 4010 lead-->gen change and lead revision ~ 2012 @ [place]); b. 12/2014 Echo: EF 45-50%;  c.08/2015 Echo: EF 45-50%; d. 08/2017 Echo: EF 40-45%.  Marland Kitchen PAD (peripheral artery disease) ([facility])    a. s/p bilat BKA  . Persistent atrial fibrillation    a. Dx 12/2014.  CHA2DS2VASc = 6--> warfarin;  b. 09/2015 s/p DCCV-->on amio; c. s/p DCCV 04/25/16; d. 07/2016 s/p RFCA/PVI in setting of recurrent afib despite amio; e. 05/2018 Recurrent afib.   Past Surgical History:  Procedure Laterality Date  . ABDOMINAL HYSTERECTOMY    . Amputation lower extremity bilaterally Bilateral   . CAPD INSERTION N/A 10/25/2018   Procedure: LAPAROSCOPIC INSERTION CONTINUOUS AMBULATORY PERITONEAL DIALYSIS  (CAPD) CATHETER;  Surgeon: Katha Cabal, MD;  Location: [facility] ORS;  Service: Vascular;  Laterality: N/A;  . CARDIAC CATHETERIZATION    . CARDIOVERSION N/A 10/09/2016   Procedure: CARDIOVERSION;  Surgeon: Lelon Perla, MD;  Location: [facility] ENDOSCOPY;  Service: Cardiovascular;  Laterality: N/A;  . CORONARY ARTERY BYPASS GRAFT    . CORONARY STENT INTERVENTION N/A 11/20/2018  Procedure: CORONARY STENT INTERVENTION;  Surgeon: Isaias Cowman, MD;  Location: [place] CV LAB;  Service: Cardiovascular;  Laterality: N/A;  SVG- OM  . DIALYSIS/PERMA CATHETER INSERTION N/A 10/22/2018   Procedure: DIALYSIS/PERMA CATHETER INSERTION;  Surgeon: Katha Cabal, MD;  Location: [place] CV LAB;  Service: Cardiovascular;  Laterality: N/A;  . DIALYSIS/PERMA CATHETER REMOVAL N/A 01/06/2019   Procedure: DIALYSIS/PERMA CATHETER REMOVAL;  Surgeon: Algernon Huxley, MD;  Location: [place] CV LAB;  Service:  Cardiovascular;  Laterality: N/A;  . ELECTROPHYSIOLOGIC STUDY N/A 10/07/2015   Procedure: CARDIOVERSION;  Surgeon: Minna Merritts, MD;  Location: [facility] ORS;  Service: Cardiovascular;  Laterality: N/A;  . ELECTROPHYSIOLOGIC STUDY N/A 04/25/2016   Procedure: Cardioversion;  Surgeon: Minna Merritts, MD;  Location: [facility] ORS;  Service: Cardiovascular;  Laterality: N/A;  . ELECTROPHYSIOLOGIC STUDY N/A 07/18/2016   Procedure: Atrial Fibrillation Ablation;  Surgeon: Thompson Grayer, MD;  Location: [place] CV LAB;  Service: Cardiovascular;  Laterality: N/A;  . IMPLANTABLE CARDIOVERTER DEFIBRILLATOR IMPLANT  2005   [employer]   . LEFT HEART CATH AND CORS/GRAFTS ANGIOGRAPHY N/A 11/20/2018   Procedure: LEFT HEART CATH AND CORS/GRAFTS ANGIOGRAPHY;  Surgeon: Minna Merritts, MD;  Location: [place] CV LAB;  Service: Cardiovascular;  Laterality: N/A;  . TEE WITHOUT CARDIOVERSION N/A 07/18/2016   Procedure: TRANSESOPHAGEAL ECHOCARDIOGRAM (TEE);  Surgeon: Sueanne Margarita, MD;  Location: [facility] ENDOSCOPY;  Service: Cardiovascular;  Laterality: N/A;  . TEE WITHOUT CARDIOVERSION N/A 10/09/2016   Procedure: TRANSESOPHAGEAL ECHOCARDIOGRAM (TEE);  Surgeon: Lelon Perla, MD;  Location: [facility] ENDOSCOPY;  Service: Cardiovascular;  Laterality: N/A;    Home Medications:  Medications Prior to Admission  Medication Sig Dispense Refill Last Dose  . amitriptyline (ELAVIL) 25 MG tablet Take 25 mg by mouth at bedtime.   11 02/14/2019 at 2200  . amLODipine (NORVASC) 10 MG tablet TAKE 1 TABLET BY MOUTH DAILY 90 tablet 0 02/15/2019 at 0800  . apixaban (ELIQUIS) 5 MG TABS tablet Take 1 tablet (5 mg total) by mouth 2 (two) times daily. 60 tablet 9 02/15/2019 at 0800  . atorvastatin (LIPITOR) 40 MG tablet Take 40 mg by mouth at bedtime.   11 02/14/2019 at 2200  . Cholecalciferol (HM VITAMIN D3) 100 MCG (4000 UT) CAPS Take 4,000 Units by mouth daily.    02/14/2019 at 2200  . clopidogrel (PLAVIX) 75 MG tablet Take 1 tablet (75 mg) by mouth  once daily 30 tablet 6 02/14/2019 at 2200  . gabapentin (NEURONTIN) 100 MG capsule Take 100 mg by mouth 2 (two) times daily.   02/15/2019 at 0800  . insulin aspart (NOVOLOG) 100 UNIT/ML injection Inject 5-10 Units into the skin 3 (three) times daily before meals. 10 units into the skin before breakfast then 5 units before lunch then 10 units before supper (evening meal)   02/15/2019 at 1700  . insulin glargine (LANTUS) 100 UNIT/ML injection Inject 0.15 mLs (15 Units total) into the skin 2 (two) times daily. 10 mL 1 02/15/2019 at 0800  . iron polysaccharides (NIFEREX) 150 MG capsule Take 150 mg by mouth daily.   02/14/2019 at 2200  . isosorbide mononitrate (IMDUR) 30 MG 24 hr tablet Take 1 tablet (30 mg total) by mouth daily for 30 days. 30 tablet 0 02/15/2019 at 0800  . losartan (COZAAR) 100 MG tablet TK 1 T PO QD   02/14/2019 at 2200  . metoprolol tartrate (LOPRESSOR) 25 MG tablet Take 0.5 tablets (12.5 mg total) by mouth 2 (two) times daily for  30 days. 60 tablet 0 02/15/2019 at 0800  . Multiple Vitamins-Minerals (VITEYES AREDS FORMULA/LUTEIN) CAPS Take 1 capsule by mouth 2 (two) times daily.   02/15/2019 at 0800  . pantoprazole (PROTONIX) 40 MG tablet Take 40 mg by mouth at bedtime.   02/14/2019 at 2200  . sertraline (ZOLOFT) 100 MG tablet Take 100 mg by mouth at bedtime.   11 02/14/2019 at 2200  . torsemide (DEMADEX) 20 MG tablet Take 2 tablets (40 mg total) by mouth 2 (two) times daily. 360 tablet 0 02/15/2019 at 0800  . vitamin C (ASCORBIC ACID) 500 MG tablet Take 500 mg by mouth daily.   02/14/2019 at 2200  . nitroGLYCERIN (NITROSTAT) 0.4 MG SL tablet Place 1 tablet (0.4 mg total) under the tongue every 5 (five) minutes x 3 doses as needed for chest pain. 30 tablet 0 prn at prn   Allergies:  Allergies  Allergen Reactions  . Piperacillin Other (See Comments)    Renal failure  . 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 See ID note**PER PT-CAUSED RENAL FAILURE**  . Zosyn  [Piperacillin Sod-Tazobactam So] Other (See Comments)    Renal failure    Family History  Problem Relation Age of Onset  . CAD Mother   . Diabetes Mother   . Atrial fibrillation Mother   . Lung cancer Father   . Diabetes Father    Social History:  reports that she has never smoked. She has never used smokeless tobacco. She reports that she does not drink alcohol or use drugs.  ROS: A complete review of systems was performed.  All systems are negative except for pertinent findings as noted. Review of Systems  Constitutional: Positive for malaise/fatigue.  Genitourinary: Positive for hematuria.  All other systems reviewed and are negative.    Physical Exam:  Vital signs in last 24 hours: Temp:  [98 F (36.7 C)-99.7 F (37.6 C)] 98 F (36.7 C) (06/07 1212) Pulse Rate:  [66-88] 67 (06/07 1212) Resp:  [19-24] 19 (06/07 1212) BP: (101-124)/(62-72) 101/67 (06/07 1212) SpO2:  [91 %-100 %] 95 % (06/07 1212) Weight:  [93.4 kg-106.4 kg] 106.4 kg (06/07 0104) General:  Alert and oriented, No acute distress HEENT: Normocephalic, atraumatic Cardiovascular: Regular rate and rhythm Lungs: Regular rate and effort Abdomen: Soft, nontender, nondistended, no abdominal masses Back: No CVA tenderness Extremities: No edema, amputee Neurologic: Grossly intact GU: urine in canister ruby red, no clots.   Laboratory Data:  Results for orders placed or performed during the hospital encounter of 02/15/19 (from the past 24 hour(s))  CBC with Differential     Status: Abnormal   Collection Time: 02/15/19  7:48 PM  Result Value Ref Range   WBC 32.6 (H) 4.0 - 10.5 K/uL   RBC 3.90 3.87 - 5.11 MIL/uL   Hemoglobin 10.9 (L) 12.0 - 15.0 g/dL   HCT 35.5 (L) 36.0 - 46.0 %   MCV 91.0 80.0 - 100.0 fL   MCH 27.9 26.0 - 34.0 pg   MCHC 30.7 30.0 - 36.0 g/dL   RDW 15.5 11.5 - 15.5 %   Platelets 147 (L) 150 - 400 K/uL   nRBC 0.0 0.0 - 0.2 %   Neutrophils Relative % 30 %   Neutro Abs 9.6 (H) 1.7 - 7.7  K/uL   Lymphocytes Relative 67 %   Lymphs Abs 21.7 (H) 0.7 - 4.0 K/uL   Monocytes Relative 2 %   Monocytes Absolute 0.8 0.1 - 1.0 K/uL  Eosinophils Relative 1 %   Eosinophils Absolute 0.2 0.0 - 0.5 K/uL   Basophils Relative 0 %   Basophils Absolute 0.1 0.0 - 0.1 K/uL   WBC Morphology SMUDGE CELLS    RBC Morphology MORPHOLOGY UNREMARKABLE    Smear Review Normal platelet morphology    Immature Granulocytes 0 %   Abs Immature Granulocytes 0.13 (H) 0.00 - 0.07 K/uL  Comprehensive metabolic panel     Status: Abnormal   Collection Time: 02/15/19  7:48 PM  Result Value Ref Range   Sodium 136 135 - 145 mmol/L   Potassium 4.4 3.5 - 5.1 mmol/L   Chloride 99 98 - 111 mmol/L   CO2 25 22 - 32 mmol/L   Glucose, Bld 228 (H) 70 - 99 mg/dL   BUN 46 (H) 8 - 23 mg/dL   Creatinine, Ser 4.55 (H) 0.44 - 1.00 mg/dL   Calcium 8.6 (L) 8.9 - 10.3 mg/dL   Total Protein 6.5 6.5 - 8.1 g/dL   Albumin 3.6 3.5 - 5.0 g/dL   AST 26 15 - 41 U/L   ALT 10 0 - 44 U/L   Alkaline Phosphatase 77 38 - 126 U/L   Total Bilirubin 1.3 (H) 0.3 - 1.2 mg/dL   GFR calc non Af Amer 9 (L) >60 mL/min   GFR calc Af Amer 11 (L) >60 mL/min   Anion gap 12 5 - 15  Lipase, blood     Status: None   Collection Time: 02/15/19  7:48 PM  Result Value Ref Range   Lipase 34 11 - 51 U/L  Urinalysis, Complete w Microscopic     Status: Abnormal   Collection Time: 02/15/19  8:44 PM  Result Value Ref Range   Color, Urine RED (A) YELLOW   APPearance CLOUDY (A) CLEAR   Specific Gravity, Urine 1.018 1.005 - 1.030   pH  5.0 - 8.0    TEST NOT REPORTED DUE TO COLOR INTERFERENCE OF URINE PIGMENT   Glucose, UA (A) NEGATIVE mg/dL    TEST NOT REPORTED DUE TO COLOR INTERFERENCE OF URINE PIGMENT   Hgb urine dipstick (A) NEGATIVE    TEST NOT REPORTED DUE TO COLOR INTERFERENCE OF URINE PIGMENT   Bilirubin Urine (A) NEGATIVE    TEST NOT REPORTED DUE TO COLOR INTERFERENCE OF URINE PIGMENT   Ketones, ur (A) NEGATIVE mg/dL    TEST NOT REPORTED DUE TO  COLOR INTERFERENCE OF URINE PIGMENT   Protein, ur (A) NEGATIVE mg/dL    TEST NOT REPORTED DUE TO COLOR INTERFERENCE OF URINE PIGMENT   Nitrite (A) NEGATIVE    TEST NOT REPORTED DUE TO COLOR INTERFERENCE OF URINE PIGMENT   Leukocytes,Ua (A) NEGATIVE    TEST NOT REPORTED DUE TO COLOR INTERFERENCE OF URINE PIGMENT   RBC / HPF >50 (H) 0 - 5 RBC/hpf   WBC, UA >50 (H) 0 - 5 WBC/hpf   Bacteria, UA MANY (A) NONE SEEN   Squamous Epithelial / LPF 11-20 0 - 5  SARS Coronavirus 2 (CEPHEID - Performed in [place] hospital lab), Hosp Order     Status: None   Collection Time: 02/15/19  8:44 PM  Result Value Ref Range   SARS Coronavirus 2 NEGATIVE NEGATIVE  Glucose, capillary     Status: Abnormal   Collection Time: 02/16/19  1:03 AM  Result Value Ref Range   Glucose-Capillary 160 (H) 70 - 99 mg/dL   Comment 1 Notify RN   Basic metabolic panel     Status: Abnormal  Collection Time: 02/16/19  4:27 AM  Result Value Ref Range   Sodium 134 (L) 135 - 145 mmol/L   Potassium 4.9 3.5 - 5.1 mmol/L   Chloride 98 98 - 111 mmol/L   CO2 25 22 - 32 mmol/L   Glucose, Bld 187 (H) 70 - 99 mg/dL   BUN 52 (H) 8 - 23 mg/dL   Creatinine, Ser 4.78 (H) 0.44 - 1.00 mg/dL   Calcium 8.3 (L) 8.9 - 10.3 mg/dL   GFR calc non Af Amer 9 (L) >60 mL/min   GFR calc Af Amer 10 (L) >60 mL/min   Anion gap 11 5 - 15  CBC     Status: Abnormal   Collection Time: 02/16/19  4:27 AM  Result Value Ref Range   WBC 25.1 (H) 4.0 - 10.5 K/uL   RBC 3.53 (L) 3.87 - 5.11 MIL/uL   Hemoglobin 9.7 (L) 12.0 - 15.0 g/dL   HCT 31.8 (L) 36.0 - 46.0 %   MCV 90.1 80.0 - 100.0 fL   MCH 27.5 26.0 - 34.0 pg   MCHC 30.5 30.0 - 36.0 g/dL   RDW 15.7 (H) 11.5 - 15.5 %   Platelets 152 150 - 400 K/uL   nRBC 0.0 0.0 - 0.2 %  Glucose, capillary     Status: Abnormal   Collection Time: 02/16/19  4:47 AM  Result Value Ref Range   Glucose-Capillary 174 (H) 70 - 99 mg/dL   Comment 1 Notify RN   Glucose, capillary     Status: Abnormal   Collection  Time: 02/16/19  7:38 AM  Result Value Ref Range   Glucose-Capillary 151 (H) 70 - 99 mg/dL  Glucose, capillary     Status: Abnormal   Collection Time: 02/16/19 12:10 PM  Result Value Ref Range   Glucose-Capillary 170 (H) 70 - 99 mg/dL   Recent Results (from the past 240 hour(s))  SARS Coronavirus 2 (CEPHEID - Performed in [place] hospital lab), Hosp Order     Status: None   Collection Time: 02/15/19  8:44 PM  Result Value Ref Range Status   SARS Coronavirus 2 NEGATIVE NEGATIVE Final    Comment: (NOTE) If result is NEGATIVE SARS-CoV-2 target nucleic acids are NOT DETECTED. The SARS-CoV-2 RNA is generally detectable in upper and lower  respiratory specimens during the acute phase of infection. The lowest  concentration of SARS-CoV-2 viral copies this assay can detect is 250  copies / mL. A negative result does not preclude SARS-CoV-2 infection  and should not be used as the sole basis for treatment or other  patient management decisions.  A negative result may occur with  improper specimen collection / handling, submission of specimen other  than nasopharyngeal swab, presence of viral mutation(s) within the  areas targeted by this assay, and inadequate number of viral copies  (<250 copies / mL). A negative result must be combined with clinical  observations, patient history, and epidemiological information. If result is POSITIVE SARS-CoV-2 target nucleic acids are DETECTED. The SARS-CoV-2 RNA is generally detectable in upper and lower  respiratory specimens dur ing the acute phase of infection.  Positive  results are indicative of active infection with SARS-CoV-2.  Clinical  correlation with patient history and other diagnostic information is  necessary to determine patient infection status.  Positive results do  not rule out bacterial infection or co-infection with other viruses. If result is PRESUMPTIVE POSTIVE SARS-CoV-2 nucleic acids MAY BE PRESENT.   A presumptive positive  result was  obtained on the submitted specimen  and confirmed on repeat testing.  While 2019 novel coronavirus  (SARS-CoV-2) nucleic acids may be present in the submitted sample  additional confirmatory testing may be necessary for epidemiological  and / or clinical management purposes  to differentiate between  SARS-CoV-2 and other Sarbecovirus currently known to infect humans.  If clinically indicated additional testing with an alternate test  methodology [phone]) is advised. The SARS-CoV-2 RNA is generally  detectable in upper and lower respiratory sp ecimens during the acute  phase of infection. The expected result is Negative. Fact Sheet for Patients:  StrictlyIdeas.no Fact Sheet for Healthcare Providers: BankingDealers.co.za This test is not yet approved or cleared by the [place] FDA and has been authorized for detection and/or diagnosis of SARS-CoV-2 by FDA under an Emergency Use Authorization (EUA).  This EUA will remain in effect (meaning this test can be used) for the duration of the COVID-19 declaration under Section 564(b)(1) of the Act, 21 U.S.C. section 360bbb-3(b)(1), unless the authorization is terminated or revoked sooner. Performed at [facility], [address] [place], [place] [address]    Creatinine: Recent Labs    02/15/19 1948 02/16/19 0427  CREATININE 4.55* 4.78*   I reviewed CT images and Care Everywhere notes and reports   Impression/Assessment:  Hemorrhagic, emphysematous cystitis-  Plan:  Agree with continued antibiotic therapy, tapering for a 10 to 14-day course.  Foley catheter ordered to be placed to leave it for a couple of days to decompress the bladder until urine clears. She follows with [facility] and will need to follow-up with them. They did a good eval Oct 2019 with bbx and RG pyelograms.   Festus Aloe 02/16/2019, 2:26 PM

## 2019-02-16 NOTE — Progress Notes (Signed)
[facility] NAME: Donae Kueker    MR#:  [id number]  DATE OF BIRTH:  1946-09-05  SUBJECTIVE:   Patient ate breakfast in the morning there after started feeling elevated nauseous. No vomiting. Received Zofran earlier. No fever. REVIEW OF SYSTEMS:   Review of Systems  Constitutional: Negative for chills, fever and weight loss.  HENT: Negative for ear discharge, ear pain and nosebleeds.   Eyes: Negative for blurred vision, pain and discharge.  Respiratory: Negative for sputum production, shortness of breath, wheezing and stridor.   Cardiovascular: Negative for chest pain, palpitations, orthopnea and PND.  Gastrointestinal: Positive for nausea. Negative for abdominal pain, diarrhea and vomiting.  Genitourinary: Negative for frequency and urgency.  Musculoskeletal: Negative for back pain and joint pain.  Neurological: Positive for weakness. Negative for sensory change, speech change and focal weakness.  Psychiatric/Behavioral: Negative for depression and hallucinations. The patient is not nervous/anxious.    Tolerating Diet:yes Tolerating PT:   DRUG ALLERGIES:   Allergies  Allergen Reactions  . Piperacillin Other (See Comments)    Renal failure  . 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 See ID note**PER PT-CAUSED RENAL FAILURE**  . Zosyn [Piperacillin Sod-Tazobactam So] Other (See Comments)    Renal failure    VITALS:  Blood pressure 101/67, pulse 67, temperature 98 F (36.7 C), temperature source Oral, resp. rate 19, height 5\' 8"  (1.727 m), weight 106.4 kg, SpO2 95 %.  PHYSICAL EXAMINATION:   Physical Exam  GENERAL:  73 y.o.-year-old patient lying in the bed with no acute distress.  EYES: Pupils equal, round, reactive to light and accommodation. No scleral icterus. Extraocular muscles intact.  HEENT: Head atraumatic, normocephalic. Oropharynx and nasopharynx clear.  NECK:   Supple, no jugular venous distention. No thyroid enlargement, no tenderness.  LUNGS: Normal breath sounds bilaterally, no wheezing, rales, rhonchi. No use of accessory muscles of respiration.  CARDIOVASCULAR: S1, S2 normal. No murmurs, rubs, or gallops.  ABDOMEN: Soft, nontender, nondistended. Bowel sounds present. No organomegaly or mass.  PD cath + EXTREMITIES: bilateral BKA stumps NEUROLOGIC: Cranial nerves II through XII are intact. No focal Motor or sensory deficits b/l.   PSYCHIATRIC:  patient is alert and oriented x 3.  SKIN: No obvious rash, lesion, or ulcer.   LABORATORY PANEL:  CBC Recent Labs  Lab 02/16/19 0427  WBC 25.1*  HGB 9.7*  HCT 31.8*  PLT 152    Chemistries  Recent Labs  Lab 02/15/19 1948 02/16/19 0427  NA 136 134*  K 4.4 4.9  CL 99 98  CO2 25 25  GLUCOSE 228* 187*  BUN 46* 52*  CREATININE 4.55* 4.78*  CALCIUM 8.6* 8.3*  AST 26  --   ALT 10  --   ALKPHOS 77  --   BILITOT 1.3*  --    Cardiac Enzymes No results for input(s): TROPONINI in the last 168 hours. RADIOLOGY:  Dg Chest 1 View  Result Date: 02/15/2019 CLINICAL DATA:  Right shoulder pain, nausea EXAM: CHEST  1 VIEW COMPARISON:  11/21/2018 FINDINGS: Cardiomegaly status post median sternotomy with left chest multi lead pacer defibrillator. Mild, diffuse interstitial pulmonary opacity, most conspicuous in the bilateral lung bases. IMPRESSION: Cardiomegaly. Mild, diffuse interstitial pulmonary opacity, most conspicuous in the bilateral lung bases, likely edema. Electronically Signed   By: Eddie Candle M.D.   On: 02/15/2019 20:33   Ct Renal Stone Study  Result Date: 02/15/2019 CLINICAL DATA:  Bilateral flank pain EXAM: CT ABDOMEN AND PELVIS WITHOUT CONTRAST TECHNIQUE: Multidetector CT imaging of the abdomen and pelvis was performed following the standard protocol without IV contrast. COMPARISON:  CT chest, 03/07/2018 FINDINGS: Lower chest: Small bilateral pleural effusions and associated atelectasis  or consolidation. Scattered ground-glass opacities of the included lung bases. Cardiomegaly and coronary artery calcifications. Partially imaged pacer defibrillator leads Hepatobiliary: No solid liver abnormality is seen. Small gallstones in the dependent gallbladder. Gallbladder wall thickening, or biliary dilatation. Pancreas: Unremarkable. No pancreatic ductal dilatation or surrounding inflammatory changes. Spleen: Normal in size without significant abnormality. Adrenals/Urinary Tract: Adrenal glands are unremarkable. Kidneys are normal, without renal calculi, solid lesion, or hydronephrosis. There is bladder wall emphysema and gas within the small vessels about the bladder (series 2, image 79). Stomach/Bowel: Stomach is within normal limits. Appendix appears normal. No evidence of bowel wall thickening, distention, or inflammatory changes. Vascular/Lymphatic: Pipelike calcific atherosclerosis and vascular calcinosis of the aorta and branch vessels. There are enlarged bilateral inguinal, iliac, pelvic sidewall, and retroperitoneal lymph nodes. Reproductive: No mass or other significant abnormality. Other: No abdominal wall hernia or abnormality. Trace ascites. There is a Tenckhoff type peritoneal dialysis catheter, positioned in the left paracolic gutter. Musculoskeletal: No acute or significant osseous findings. IMPRESSION: 1. There is bladder wall emphysema and gas within the small vessels about the bladder (series 2, image 79). 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 Numerous enlarged abdominopelvic lymph nodes, likely reactive. 2. Trace ascites, likely peritoneal dialysate. There is a Tenckhoff type peritoneal dialysis catheter, positioned in the left paracolic gutter. 3. Small bilateral pleural effusions and associated atelectasis or consolidation. Scattered infectious or inflammatory ground-glass opacities of the included lung bases. 4.  Other chronic and incidental findings as detailed above.  Electronically Signed   By: Eddie Candle M.D.   On: 02/15/2019 21:43   ASSESSMENT AND PLAN:  Abaigeal Moomaw  is a 73 y.o. Caucasian female with a known history of multiple medical problems that would be mentioned below, who presented to the emergency room with onset of left-sided flank and right-sided lower back pain with associated vomiting most of the day today without fever or chills.  She has been feeling bad over the last 3 days.     1.  UTI that could be associated with early pyelonephritis.   -continue antibiotic therapy with IV Rocephin and follow urine culture and sensitivity. 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 -Dr. Junious Silk urology consulted. -Patient was hemodynamically stable does not appear like sepsis  2.  End-stage renal disease on peritoneal dialysis.  - Nephrology follow-up consultation with Dr Candiss Norse -PD tonite  3.  Atrial fibrillation with controlled ventricular response.  - continue Eliquis and Lopressor.  4.  Coronary artery disease.  - continue Lopressor and Imdur as well as Plavix.  5.  Dyslipidemia.  Statin therapy will be resumed.  6.  Hypertension.  Continue amlodipine and Lopressor.  7.  DVT prophylaxis.  Continue Eliquis.   CODE STATUS: full  TOTAL TIME TAKING CARE OF THIS PATIENT: *30 minutes.  >50% time spent on counselling and coordination of care  POSSIBLE D/C IN *2-3* DAYS, DEPENDING ON CLINICAL CONDITION.  Note: This dictation was prepared with Dragon dictation along with smaller phrase technology. Any transcriptional errors that result from this process are unintentional.  Fritzi Mandes M.D on 02/16/2019 at 2:22 PM  Between 7am to 6pm - Pager - [phone]  After 6pm go to [email or web address] - password [email or web address]  [employer]  [phone]  CC: Primary care physician; System, Pcp Not InPatient ID: Annalissa Murphey, female   DOB: 09-08-1946, 73 y.o.   MRN: [id number]

## 2019-02-16 NOTE — ED Notes (Signed)
ED TO INPATIENT HANDOFF REPORT  ED Nurse Name and Phone #: Wells Guiles [phone]  S Name/Age/Gender Caroline Hernandez 73 y.o. female Room/Bed: ED04A/ED04A  Code Status   Code Status: Full Code  Home/SNF/Other Home Patient oriented to: self, place, time and situation Is this baseline? Yes   Triage Complete: Triage complete  Chief Complaint [facility] Chest Pain  Triage Note Patient to [facility] 4 via EMS from home.  Reports patient with right shoulder pain that radiates into right flank with nausea.  Per EMS patient pulse oxi with 89% on room air but up to 94% with O2 at 3 liters (patient has this at home as needed, usus ally at night).  BP 130/p, CBG 260.  Patient received zofran 4 mg IM (left deltoid).  Patient also took her 6 pm insulins prior to ems arrival and has not eaten.   Allergies Allergies  Allergen Reactions  . Piperacillin Other (See Comments)    Renal failure  . 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 See ID note**PER PT-CAUSED RENAL FAILURE**  . Zosyn [Piperacillin Sod-Tazobactam So] Other (See Comments)    Renal failure    Level of Care/Admitting Diagnosis ED Disposition    ED Disposition Condition [facility] Area: [place] [[id number]]  Level of Care: Med-Surg [16]  Covid Evaluation: Confirmed COVID Negative  Diagnosis: UTI (urinary tract infection) [308657]  Admitting Physician: Christel Mormon [[id number]]  Attending Physician: Christel Mormon [[id number]]  Estimated length of stay: past midnight tomorrow  Certification:: I certify this patient will need inpatient services for at least 2 midnights  PT Class (Do Not Modify): Inpatient [101]  PT Acc Code (Do Not Modify): Private [1]       B Medical/Surgery History Past Medical History:  Diagnosis Date  . Amputation of left lower extremity below knee ([place])   . Amputation of right lower extremity below knee ([place])   . CAD (coronary artery disease)    a.  2004 Cardiac Arrest/CABG x 3 (LIMA->LAD, VG->OM, VG->RCA);  b. 06/2015 lexiscan MV: no significant ischemia, EF 48%, low risk->Med Rx.  . Chronic combined systolic and diastolic CHF (congestive heart failure) ([place])    a. 12/2014 Echo: EF 45-50%;  b. 08/2015 Echo: EF 45-50%, ant, antsept HK, mildly dil LA, nl RV, mild-mod TR, sev PAH (16mmHg); b. 08/2017 TEE: EF 40-45%, large PFO w/ L->R shunting.  . CKD (chronic kidney disease), stage IV ([place])   . CLL (chronic lymphocytic leukemia) ([place])   . Diabetes mellitus without complication ([place])   . Essential hypertension   . GERD (gastroesophageal reflux disease)   . Hiatal hernia   . History of cardiac arrest    a. 2004.  Marland Kitchen Hyperlipidemia   . Ischemic cardiomyopathy    a. s/p MDT ICD (originally had 4401 lead-->gen change and lead revision ~ 2012 @ [facility]); b. 12/2014 Echo: EF 45-50%;  c.08/2015 Echo: EF 45-50%; d. 08/2017 Echo: EF 40-45%.  Marland Kitchen PAD (peripheral artery disease) ([facility])    a. s/p bilat BKA  . Persistent atrial fibrillation    a. Dx 12/2014.  CHA2DS2VASc = 6--> warfarin;  b. 09/2015 s/p DCCV-->on amio; c. s/p DCCV 04/25/16; d. 07/2016 s/p RFCA/PVI in setting of recurrent afib despite amio; e. 05/2018 Recurrent afib.   Past Surgical History:  Procedure Laterality Date  . ABDOMINAL HYSTERECTOMY    . Amputation lower extremity bilaterally Bilateral   . CAPD INSERTION N/A 10/25/2018  Procedure: LAPAROSCOPIC INSERTION CONTINUOUS AMBULATORY PERITONEAL DIALYSIS  (CAPD) CATHETER;  Surgeon: Katha Cabal, MD;  Location: [facility] ORS;  Service: Vascular;  Laterality: N/A;  . CARDIAC CATHETERIZATION    . CARDIOVERSION N/A 10/09/2016   Procedure: CARDIOVERSION;  Surgeon: Lelon Perla, MD;  Location[facility] ENDOSCOPY;  Service: Cardiovascular;  Laterality: N/A;  . CORONARY ARTERY BYPASS GRAFT    . CORONARY STENT INTERVENTION N/A 11/20/2018   Procedure: CORONARY STENT INTERVENTION;  Surgeon: Isaias Cowman, MD;  Location: [place] CV LAB;  Service:  Cardiovascular;  Laterality: N/A;  SVG- OM  . DIALYSIS/PERMA CATHETER INSERTION N/A 10/22/2018   Procedure: DIALYSIS/PERMA CATHETER INSERTION;  Surgeon: Katha Cabal, MD;  Location: [place] CV LAB;  Service: Cardiovascular;  Laterality: N/A;  . DIALYSIS/PERMA CATHETER REMOVAL N/A 01/06/2019   Procedure: DIALYSIS/PERMA CATHETER REMOVAL;  Surgeon: Algernon Huxley, MD;  Location: [place] CV LAB;  Service: Cardiovascular;  Laterality: N/A;  . ELECTROPHYSIOLOGIC STUDY N/A 10/07/2015   Procedure: CARDIOVERSION;  Surgeon: Minna Merritts, MD;  Location: [facility] ORS;  Service: Cardiovascular;  Laterality: N/A;  . ELECTROPHYSIOLOGIC STUDY N/A 04/25/2016   Procedure: Cardioversion;  Surgeon: Minna Merritts, MD;  Location: [facility] ORS;  Service: Cardiovascular;  Laterality: N/A;  . ELECTROPHYSIOLOGIC STUDY N/A 07/18/2016   Procedure: Atrial Fibrillation Ablation;  Surgeon: Thompson Grayer, MD;  Location: [place] CV LAB;  Service: Cardiovascular;  Laterality: N/A;  . IMPLANTABLE CARDIOVERTER DEFIBRILLATOR IMPLANT  2005   [employer]   . LEFT HEART CATH AND CORS/GRAFTS ANGIOGRAPHY N/A 11/20/2018   Procedure: LEFT HEART CATH AND CORS/GRAFTS ANGIOGRAPHY;  Surgeon: Minna Merritts, MD;  Location: [place] CV LAB;  Service: Cardiovascular;  Laterality: N/A;  . TEE WITHOUT CARDIOVERSION N/A 07/18/2016   Procedure: TRANSESOPHAGEAL ECHOCARDIOGRAM (TEE);  Surgeon: Sueanne Margarita, MD;  Location: [facility] ENDOSCOPY;  Service: Cardiovascular;  Laterality: N/A;  . TEE WITHOUT CARDIOVERSION N/A 10/09/2016   Procedure: TRANSESOPHAGEAL ECHOCARDIOGRAM (TEE);  Surgeon: Lelon Perla, MD;  Location: [facility] ENDOSCOPY;  Service: Cardiovascular;  Laterality: N/A;     A IV Location/Drains/Wounds Patient Lines/Drains/Airways Status   Active Line/Drains/Airways    Name:   Placement date:   Placement time:   Site:   Days:   Peripheral IV 02/15/19 Right Antecubital   02/15/19    1915    Antecubital   1   Hemodialysis  Catheter   10/21/18    -    -   118   Incision (Closed) 10/25/18 Abdomen Other (Comment)   10/25/18    1344     114   Incision (Closed) 11/20/18 Groin Right   11/20/18    1753     88   Incision - 2 Ports Abdomen Umbilicus Lower;Medial   10/25/18    1350     114   Pressure Injury 10/16/18 Stage I -  Intact skin with non-blanchable redness of a localized area usually over a bony prominence.   10/16/18    1700     123   Pressure Injury 10/16/18 Stage I -  Intact skin with non-blanchable redness of a localized area usually over a bony prominence. on both stumps   10/16/18    1700     123   Wound / Incision (Open or Dehisced) 11/19/18 Laceration Leg Left round red 1.5 X 1, bleeding calloused edges   11/19/18    2200    Leg   89          Intake/Output Last 24  hours No intake or output data in the 24 hours ending 02/16/19 0013  Labs/Imaging Results for orders placed or performed during the hospital encounter of 02/15/19 (from the past 48 hour(s))  CBC with Differential     Status: Abnormal   Collection Time: 02/15/19  7:48 PM  Result Value Ref Range   WBC 32.6 (H) 4.0 - 10.5 K/uL   RBC 3.90 3.87 - 5.11 MIL/uL   Hemoglobin 10.9 (L) 12.0 - 15.0 g/dL   HCT 35.5 (L) 36.0 - 46.0 %   MCV 91.0 80.0 - 100.0 fL   MCH 27.9 26.0 - 34.0 pg   MCHC 30.7 30.0 - 36.0 g/dL   RDW 15.5 11.5 - 15.5 %   Platelets 147 (L) 150 - 400 K/uL   nRBC 0.0 0.0 - 0.2 %   Neutrophils Relative % 30 %   Neutro Abs 9.6 (H) 1.7 - 7.7 K/uL   Lymphocytes Relative 67 %   Lymphs Abs 21.7 (H) 0.7 - 4.0 K/uL   Monocytes Relative 2 %   Monocytes Absolute 0.8 0.1 - 1.0 K/uL   Eosinophils Relative 1 %   Eosinophils Absolute 0.2 0.0 - 0.5 K/uL   Basophils Relative 0 %   Basophils Absolute 0.1 0.0 - 0.1 K/uL   WBC Morphology SMUDGE CELLS    RBC Morphology MORPHOLOGY UNREMARKABLE    Smear Review Normal platelet morphology    Immature Granulocytes 0 %   Abs Immature Granulocytes 0.13 (H) 0.00 - 0.07 K/uL    Comment: Performed  at [facility], [address] [place], [place] [address]  Comprehensive metabolic panel     Status: Abnormal   Collection Time: 02/15/19  7:48 PM  Result Value Ref Range   Sodium 136 135 - 145 mmol/L   Potassium 4.4 3.5 - 5.1 mmol/L   Chloride 99 98 - 111 mmol/L   CO2 25 22 - 32 mmol/L   Glucose, Bld 228 (H) 70 - 99 mg/dL   BUN 46 (H) 8 - 23 mg/dL   Creatinine, Ser 4.55 (H) 0.44 - 1.00 mg/dL   Calcium 8.6 (L) 8.9 - 10.3 mg/dL   Total Protein 6.5 6.5 - 8.1 g/dL   Albumin 3.6 3.5 - 5.0 g/dL   AST 26 15 - 41 U/L   ALT 10 0 - 44 U/L   Alkaline Phosphatase 77 38 - 126 U/L   Total Bilirubin 1.3 (H) 0.3 - 1.2 mg/dL   GFR calc non Af Amer 9 (L) >60 mL/min   GFR calc Af Amer 11 (L) >60 mL/min   Anion gap 12 5 - 15    Comment: Performed at [facility], [address] [place], [place] [address]  Lipase, blood     Status: None   Collection Time: 02/15/19  7:48 PM  Result Value Ref Range   Lipase 34 11 - 51 U/L    Comment: Performed at [facility], [address] [place], [place] [address]  Urinalysis, Complete w Microscopic     Status: Abnormal   Collection Time: 02/15/19  8:44 PM  Result Value Ref Range   Color, Urine RED (A) YELLOW   APPearance CLOUDY (A) CLEAR   Specific Gravity, Urine 1.018 1.005 - 1.030   pH  5.0 - 8.0    TEST NOT REPORTED DUE TO COLOR INTERFERENCE OF URINE PIGMENT   Glucose, UA (A) NEGATIVE mg/dL    TEST NOT REPORTED DUE TO COLOR INTERFERENCE OF URINE PIGMENT   Hgb urine dipstick (  A) NEGATIVE    TEST NOT REPORTED DUE TO COLOR INTERFERENCE OF URINE PIGMENT   Bilirubin Urine (A) NEGATIVE    TEST NOT REPORTED DUE TO COLOR INTERFERENCE OF URINE PIGMENT   Ketones, ur (A) NEGATIVE mg/dL    TEST NOT REPORTED DUE TO COLOR INTERFERENCE OF URINE PIGMENT   Protein, ur (A) NEGATIVE mg/dL    TEST NOT REPORTED DUE TO COLOR INTERFERENCE OF URINE PIGMENT   Nitrite (A) NEGATIVE    TEST NOT REPORTED DUE TO COLOR INTERFERENCE OF URINE PIGMENT    Leukocytes,Ua (A) NEGATIVE    TEST NOT REPORTED DUE TO COLOR INTERFERENCE OF URINE PIGMENT   RBC / HPF >50 (H) 0 - 5 RBC/hpf   WBC, UA >50 (H) 0 - 5 WBC/hpf   Bacteria, UA MANY (A) NONE SEEN   Squamous Epithelial / LPF 11-20 0 - 5    Comment: Performed at [name], [address], [place], [place] [address]  SARS Coronavirus 2 (CEPHEID - Performed in [place] hospital lab), Hosp Order     Status: None   Collection Time: 02/15/19  8:44 PM  Result Value Ref Range   SARS Coronavirus 2 NEGATIVE NEGATIVE    Comment: (NOTE) If result is NEGATIVE SARS-CoV-2 target nucleic acids are NOT DETECTED. The SARS-CoV-2 RNA is generally detectable in upper and lower  respiratory specimens during the acute phase of infection. The lowest  concentration of SARS-CoV-2 viral copies this assay can detect is 250  copies / mL. A negative result does not preclude SARS-CoV-2 infection  and should not be used as the sole basis for treatment or other  patient management decisions.  A negative result may occur with  improper specimen collection / handling, submission of specimen other  than nasopharyngeal swab, presence of viral mutation(s) within the  areas targeted by this assay, and inadequate number of viral copies  (<250 copies / mL). A negative result must be combined with clinical  observations, patient history, and epidemiological information. If result is POSITIVE SARS-CoV-2 target nucleic acids are DETECTED. The SARS-CoV-2 RNA is generally detectable in upper and lower  respiratory specimens dur ing the acute phase of infection.  Positive  results are indicative of active infection with SARS-CoV-2.  Clinical  correlation with patient history and other diagnostic information is  necessary to determine patient infection status.  Positive results do  not rule out bacterial infection or co-infection with other viruses. If result is PRESUMPTIVE POSTIVE SARS-CoV-2 nucleic acids MAY BE  PRESENT.   A presumptive positive result was obtained on the submitted specimen  and confirmed on repeat testing.  While 2019 novel coronavirus  (SARS-CoV-2) nucleic acids may be present in the submitted sample  additional confirmatory testing may be necessary for epidemiological  and / or clinical management purposes  to differentiate between  SARS-CoV-2 and other Sarbecovirus currently known to infect humans.  If clinically indicated additional testing with an alternate test  methodology [phone]) is advised. The SARS-CoV-2 RNA is generally  detectable in upper and lower respiratory sp ecimens during the acute  phase of infection. The expected result is Negative. Fact Sheet for Patients:  StrictlyIdeas.no Fact Sheet for Healthcare Providers: BankingDealers.co.za This test is not yet approved or cleared by the [place] FDA and has been authorized for detection and/or diagnosis of SARS-CoV-2 by FDA under an Emergency Use Authorization (EUA).  This EUA will remain in effect (meaning this test can be used) for the duration of the COVID-19 declaration under  Section 564(b)(1) of the Act, 21 U.S.C. section 360bbb-3(b)(1), unless the authorization is terminated or revoked sooner. Performed at [facility], [address] [place], [place] [address]    Dg Chest 1 View  Result Date: 02/15/2019 CLINICAL DATA:  Right shoulder pain, nausea EXAM: CHEST  1 VIEW COMPARISON:  11/21/2018 FINDINGS: Cardiomegaly status post median sternotomy with left chest multi lead pacer defibrillator. Mild, diffuse interstitial pulmonary opacity, most conspicuous in the bilateral lung bases. IMPRESSION: Cardiomegaly. Mild, diffuse interstitial pulmonary opacity, most conspicuous in the bilateral lung bases, likely edema. Electronically Signed   By: Eddie Candle M.D.   On: 02/15/2019 20:33   Ct Renal Stone Study  Result Date: 02/15/2019 CLINICAL DATA:   Bilateral flank pain EXAM: CT ABDOMEN AND PELVIS WITHOUT CONTRAST TECHNIQUE: Multidetector CT imaging of the abdomen and pelvis was performed following the standard protocol without IV contrast. COMPARISON:  CT chest, 03/07/2018 FINDINGS: Lower chest: Small bilateral pleural effusions and associated atelectasis or consolidation. Scattered ground-glass opacities of the included lung bases. Cardiomegaly and coronary artery calcifications. Partially imaged pacer defibrillator leads Hepatobiliary: No solid liver abnormality is seen. Small gallstones in the dependent gallbladder. Gallbladder wall thickening, or biliary dilatation. Pancreas: Unremarkable. No pancreatic ductal dilatation or surrounding inflammatory changes. Spleen: Normal in size without significant abnormality. Adrenals/Urinary Tract: Adrenal glands are unremarkable. Kidneys are normal, without renal calculi, solid lesion, or hydronephrosis. There is bladder wall emphysema and gas within the small vessels about the bladder (series 2, image 79). Stomach/Bowel: Stomach is within normal limits. Appendix appears normal. No evidence of bowel wall thickening, distention, or inflammatory changes. Vascular/Lymphatic: Pipelike calcific atherosclerosis and vascular calcinosis of the aorta and branch vessels. There are enlarged bilateral inguinal, iliac, pelvic sidewall, and retroperitoneal lymph nodes. Reproductive: No mass or other significant abnormality. Other: No abdominal wall hernia or abnormality. Trace ascites. There is a Tenckhoff type peritoneal dialysis catheter, positioned in the left paracolic gutter. Musculoskeletal: No acute or significant osseous findings. IMPRESSION: 1. There is bladder wall emphysema and gas within the small vessels about the bladder (series 2, image 79). 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 Numerous enlarged abdominopelvic lymph nodes, likely reactive. 2. Trace ascites, likely peritoneal dialysate. There is a  Tenckhoff type peritoneal dialysis catheter, positioned in the left paracolic gutter. 3. Small bilateral pleural effusions and associated atelectasis or consolidation. Scattered infectious or inflammatory ground-glass opacities of the included lung bases. 4.  Other chronic and incidental findings as detailed above. Electronically Signed   By: Eddie Candle M.D.   On: 02/15/2019 21:43    Pending Labs Unresulted Labs (From admission, onward)    Start     Ordered   02/16/19 5093  Basic metabolic panel  Tomorrow morning,   STAT     02/16/19 0002   02/16/19 0500  CBC  Tomorrow morning,   STAT     02/16/19 0002          Vitals/Pain Today's Vitals   02/15/19 1913 02/15/19 2200 02/15/19 2230 02/15/19 2309  BP: 124/62 121/71 115/70   Pulse: 88 74 74   Resp: (!) 22 (!) 24 (!) 24   Temp: 98.9 F (37.2 C)     TempSrc: Oral     SpO2: 95% 94% 92%   Weight:      Height:      PainSc:    4     Isolation Precautions No active isolations  Medications Medications  ceFEPIme (MAXIPIME) 1 g in sodium chloride 0.9 % 100 mL  IVPB (has no administration in time range)  enoxaparin (LOVENOX) injection 40 mg (has no administration in time range)  0.9 %  sodium chloride infusion (has no administration in time range)  acetaminophen (TYLENOL) tablet 650 mg (has no administration in time range)    Or  acetaminophen (TYLENOL) suppository 650 mg (has no administration in time range)  magnesium hydroxide (MILK OF MAGNESIA) suspension 30 mL (has no administration in time range)  ondansetron (ZOFRAN) tablet 4 mg (has no administration in time range)    Or  ondansetron (ZOFRAN) injection 4 mg (has no administration in time range)  nystatin (MYCOSTATIN/NYSTOP) topical powder ( Topical Given 02/15/19 2100)  ceFEPIme (MAXIPIME) 2 g in sodium chloride 0.9 % 100 mL IVPB (0 g Intravenous Stopped 02/15/19 2309)  gabapentin (NEURONTIN) capsule 100 mg (100 mg Oral Given 02/15/19 2241)  traMADol (ULTRAM) tablet 50 mg (50 mg  Oral Given 02/15/19 2207)  apixaban (ELIQUIS) tablet 5 mg (5 mg Oral Given 02/15/19 2207)    Mobility non-ambulatory Low fall risk   Focused Assessments Renal Assessment Handoff:  Hemodialysis Schedule:  Last Hemodialysis date and time:    Restricted appendage:     R Recommendations: See Admitting Provider Note  Report given to:   Additional Notes: Pt has peritoneal dialysis

## 2019-02-17 DIAGNOSIS — N3080 Other cystitis without hematuria: Secondary | ICD-10-CM | POA: Diagnosis not present

## 2019-02-17 DIAGNOSIS — N3001 Acute cystitis with hematuria: Secondary | ICD-10-CM | POA: Diagnosis not present

## 2019-02-17 DIAGNOSIS — R109 Unspecified abdominal pain: Secondary | ICD-10-CM | POA: Diagnosis not present

## 2019-02-17 LAB — BODY FLUID CELL COUNT WITH DIFFERENTIAL
Eos, Fluid: 1 %
Lymphs, Fluid: 56 %
Monocyte-Macrophage-Serous Fluid: 41 %
Neutrophil Count, Fluid: 1 %
Other Cells, Fluid: 1 %
Total Nucleated Cell Count, Fluid: 89 cu mm

## 2019-02-17 LAB — PATHOLOGIST SMEAR REVIEW

## 2019-02-17 LAB — GLUCOSE, CAPILLARY
Glucose-Capillary: 255 mg/dL — ABNORMAL HIGH (ref 70–99)
Glucose-Capillary: 208 mg/dL — ABNORMAL HIGH (ref 70–99)
Glucose-Capillary: 178 mg/dL — ABNORMAL HIGH (ref 70–99)
Glucose-Capillary: 169 mg/dL — ABNORMAL HIGH (ref 70–99)

## 2019-02-17 LAB — MRSA PCR SCREENING: MRSA by PCR: POSITIVE — AB

## 2019-02-17 MED ORDER — MELATONIN 5 MG PO TABS
5.0000 mg | ORAL_TABLET | Freq: Once | ORAL | Status: AC
  Administered 2019-02-17: 5 mg via ORAL
  Filled 2019-02-17: qty 1

## 2019-02-17 MED ORDER — NON FORMULARY: 5.0000 mg | Freq: Once | Status: DC

## 2019-02-17 MED ORDER — VITAMIN C 500 MG PO TABS
500.0000 mg | ORAL_TABLET | Freq: Every day | ORAL | Status: DC
  Administered 2019-02-17 – 2019-02-18 (×2): 500 mg via ORAL
  Filled 2019-02-17 (×2): qty 1

## 2019-02-17 MED ORDER — POLYSACCHARIDE IRON COMPLEX 150 MG PO CAPS
150.0000 mg | ORAL_CAPSULE | Freq: Every day | ORAL | Status: DC
  Administered 2019-02-17 – 2019-02-18 (×2): 150 mg via ORAL
  Filled 2019-02-17 (×2): qty 1

## 2019-02-17 MED ORDER — POLYETHYLENE GLYCOL 3350 17 G PO PACK: 17.0000 g | PACK | Freq: Every day | ORAL | Status: DC | PRN

## 2019-02-17 MED ORDER — SERTRALINE HCL 50 MG PO TABS
100.0000 mg | ORAL_TABLET | Freq: Every day | ORAL | Status: DC
  Administered 2019-02-17: 22:00:00 100 mg via ORAL
  Filled 2019-02-17: qty 2

## 2019-02-17 MED ORDER — ATORVASTATIN CALCIUM 20 MG PO TABS
40.0000 mg | ORAL_TABLET | Freq: Every day | ORAL | Status: DC
  Administered 2019-02-17: 22:00:00 40 mg via ORAL
  Filled 2019-02-17: qty 2

## 2019-02-17 MED ORDER — CHLORHEXIDINE GLUCONATE CLOTH 2 % EX PADS
6.0000 | MEDICATED_PAD | Freq: Every day | CUTANEOUS | Status: DC
  Administered 2019-02-18: 6 via TOPICAL

## 2019-02-17 MED ORDER — PANTOPRAZOLE SODIUM 40 MG PO TBEC
40.0000 mg | DELAYED_RELEASE_TABLET | Freq: Every day | ORAL | Status: DC
  Administered 2019-02-17: 40 mg via ORAL
  Filled 2019-02-17: qty 1

## 2019-02-17 MED ORDER — AMITRIPTYLINE HCL 25 MG PO TABS
25.0000 mg | ORAL_TABLET | Freq: Every day | ORAL | Status: DC
  Administered 2019-02-17: 25 mg via ORAL
  Filled 2019-02-17 (×2): qty 1

## 2019-02-17 MED ORDER — CLOPIDOGREL BISULFATE 75 MG PO TABS
75.0000 mg | ORAL_TABLET | Freq: Every day | ORAL | Status: DC
  Administered 2019-02-18: 75 mg via ORAL
  Filled 2019-02-17: qty 1

## 2019-02-17 MED ORDER — MUPIROCIN 2 % EX OINT
1.0000 "application " | TOPICAL_OINTMENT | Freq: Two times a day (BID) | CUTANEOUS | Status: DC
  Administered 2019-02-17 – 2019-02-18 (×2): 1 via NASAL
  Filled 2019-02-17: qty 22

## 2019-02-17 NOTE — Care Management (Signed)
Caroline Hernandez dialysis liaison notified of admission.    

## 2019-02-17 NOTE — Progress Notes (Signed)
CCPD Tx Start   02/17/19 2040  Peritoneal Catheter Left lower abdomen Continuous ambulatory  Placement Date/Time: 10/25/18 1350   Procedural Verification: Medical records & consent reviewed  Time out: Correct Patient;Correct Site;Correct Procedure;Special equipment/requirements available  Person Inserting Catheter: Dr. Delana Meyer  Catheter Locat...  Site Assessment Clean;Dry;Intact  Drainage Description None  Catheter status Accessed  Dressing Gauze/Drain sponge  Dressing Status Clean;Dry;Intact  Dressing Intervention Dressing changed  Cycler Setup  Total Number of Exchanges 4  Fill Volume 2000  Dianeal Solution Dextrose 2.5% in 6000 mL  Last Fill Volume 0  Fill Time - Minute(s) 10  Dwell Time - Hour(s) 10  Drain Time - Minute(s) 20 mins  Completion  Exit Site Care Performed Yes  Treatment Status Started  Education / Care Plan  Dialysis Education Provided Yes  Documented Education in Care Plan Yes

## 2019-02-17 NOTE — Progress Notes (Signed)
[facility] [facility] [place] NAME: Caroline Hernandez    MR#:  [id number]  DATE OF BIRTH:  April 06, 1946  SUBJECTIVE:   nausea better. No vomiting. Received Zofran earlier. No fever. REVIEW OF SYSTEMS:   Review of Systems  Constitutional: Negative for chills, fever and weight loss.  HENT: Negative for ear discharge, ear pain and nosebleeds.   Eyes: Negative for blurred vision, pain and discharge.  Respiratory: Negative for sputum production, shortness of breath, wheezing and stridor.   Cardiovascular: Negative for chest pain, palpitations, orthopnea and PND.  Gastrointestinal: Positive for nausea. Negative for abdominal pain, diarrhea and vomiting.  Genitourinary: Negative for frequency and urgency.  Musculoskeletal: Negative for back pain and joint pain.  Neurological: Positive for weakness. Negative for sensory change, speech change and focal weakness.  Psychiatric/Behavioral: Negative for depression and hallucinations. The patient is not nervous/anxious.    Tolerating Diet:yes Tolerating PT:   DRUG ALLERGIES:   Allergies  Allergen Reactions  . Piperacillin Other (See Comments)    Renal failure  . 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 See ID note**PER PT-CAUSED RENAL FAILURE**  . Zosyn [Piperacillin Sod-Tazobactam So] Other (See Comments)    Renal failure    VITALS:  Blood pressure 110/70, pulse 70, temperature 98.3 F (36.8 C), temperature source Oral, resp. rate 18, height 5\' 8"  (1.727 m), weight 106.4 kg, SpO2 98 %.  PHYSICAL EXAMINATION:   Physical Exam  GENERAL:  73 y.o.-year-old patient lying in the bed with no acute distress.  EYES: Pupils equal, round, reactive to light and accommodation. No scleral icterus. Extraocular muscles intact.  HEENT: Head atraumatic, normocephalic. Oropharynx and nasopharynx clear.  NECK:  Supple, no jugular venous distention. No thyroid enlargement, no  tenderness.  LUNGS: Normal breath sounds bilaterally, no wheezing, rales, rhonchi. No use of accessory muscles of respiration.  CARDIOVASCULAR: S1, S2 normal. No murmurs, rubs, or gallops.  ABDOMEN: Soft, nontender, nondistended. Bowel sounds present. No organomegaly or mass.  PD cath + FOLEY + EXTREMITIES: bilateral BKA stumps NEUROLOGIC: Cranial nerves II through XII are intact. No focal Motor or sensory deficits b/l.   PSYCHIATRIC:  patient is alert and oriented x 3.  SKIN: No obvious rash, lesion, or ulcer.   LABORATORY PANEL:  CBC Recent Labs  Lab 02/16/19 0427  WBC 25.1*  HGB 9.7*  HCT 31.8*  PLT 152    Chemistries  Recent Labs  Lab 02/15/19 1948 02/16/19 0427  NA 136 134*  K 4.4 4.9  CL 99 98  CO2 25 25  GLUCOSE 228* 187*  BUN 46* 52*  CREATININE 4.55* 4.78*  CALCIUM 8.6* 8.3*  AST 26  --   ALT 10  --   ALKPHOS 77  --   BILITOT 1.3*  --    Cardiac Enzymes No results for input(s): TROPONINI in the last 168 hours. RADIOLOGY:  Dg Chest 1 View  Result Date: 02/15/2019 CLINICAL DATA:  Right shoulder pain, nausea EXAM: CHEST  1 VIEW COMPARISON:  11/21/2018 FINDINGS: Cardiomegaly status post median sternotomy with left chest multi lead pacer defibrillator. Mild, diffuse interstitial pulmonary opacity, most conspicuous in the bilateral lung bases. IMPRESSION: Cardiomegaly. Mild, diffuse interstitial pulmonary opacity, most conspicuous in the bilateral lung bases, likely edema. Electronically Signed   By: Eddie Candle M.D.   On: 02/15/2019 20:33   Ct Renal Stone Study  Result Date: 02/15/2019 CLINICAL DATA:  Bilateral flank pain EXAM: CT ABDOMEN AND PELVIS  WITHOUT CONTRAST TECHNIQUE: Multidetector CT imaging of the abdomen and pelvis was performed following the standard protocol without IV contrast. COMPARISON:  CT chest, 03/07/2018 FINDINGS: Lower chest: Small bilateral pleural effusions and associated atelectasis or consolidation. Scattered ground-glass opacities of the  included lung bases. Cardiomegaly and coronary artery calcifications. Partially imaged pacer defibrillator leads Hepatobiliary: No solid liver abnormality is seen. Small gallstones in the dependent gallbladder. Gallbladder wall thickening, or biliary dilatation. Pancreas: Unremarkable. No pancreatic ductal dilatation or surrounding inflammatory changes. Spleen: Normal in size without significant abnormality. Adrenals/Urinary Tract: Adrenal glands are unremarkable. Kidneys are normal, without renal calculi, solid lesion, or hydronephrosis. There is bladder wall emphysema and gas within the small vessels about the bladder (series 2, image 79). Stomach/Bowel: Stomach is within normal limits. Appendix appears normal. No evidence of bowel wall thickening, distention, or inflammatory changes. Vascular/Lymphatic: Pipelike calcific atherosclerosis and vascular calcinosis of the aorta and branch vessels. There are enlarged bilateral inguinal, iliac, pelvic sidewall, and retroperitoneal lymph nodes. Reproductive: No mass or other significant abnormality. Other: No abdominal wall hernia or abnormality. Trace ascites. There is a Tenckhoff type peritoneal dialysis catheter, positioned in the left paracolic gutter. Musculoskeletal: No acute or significant osseous findings. IMPRESSION: 1. There is bladder wall emphysema and gas within the small vessels about the bladder (series 2, image 79). 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 Numerous enlarged abdominopelvic lymph nodes, likely reactive. 2. Trace ascites, likely peritoneal dialysate. There is a Tenckhoff type peritoneal dialysis catheter, positioned in the left paracolic gutter. 3. Small bilateral pleural effusions and associated atelectasis or consolidation. Scattered infectious or inflammatory ground-glass opacities of the included lung bases. 4.  Other chronic and incidental findings as detailed above. Electronically Signed   By: Eddie Candle M.D.   On:  02/15/2019 21:43   ASSESSMENT AND PLAN:  Caroline Hernandez  is a 73 y.o. Caucasian female with a known history of multiple medical problems that would be mentioned below, who presented to the emergency room with onset of left-sided flank and right-sided lower back pain with associated vomiting most of the day today without fever or chills.  She has been feeling bad over the last 3 days.     1.  UTI that could be associated with early pyelonephritis.   -continue antibiotic therapy with IV Rocephin and follow urine culture and sensitivity. 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 -Dr. Junious Silk urology consult appreciated. 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 She follows with hematology oncology at [facility] along with urology as well. -Patient underwentTURBT and bilateral retrograde Polygram in October 2019. -Foley catheter was placed by urology. Recommends continue it till urine clears. She will have outpatient follow-up with her urology at [facility]. -No fever. Heaton count normal.  2.  End-stage renal disease on peritoneal dialysis.  - Nephrology follow-up consultation with Dr Candiss Norse -PD resumed  3.  Atrial fibrillation with controlled ventricular response.  - continue Eliquis and Lopressor.  4.  Coronary artery disease.  - continue Lopressor and Imdur as well as Plavix.  5.  Dyslipidemia.  Statin therapy will be resumed.  6.  Hypertension.  Continue amlodipine and Lopressor.  7.  DVT prophylaxis.  Continue Eliquis.   CODE STATUS: full  TOTAL TIME TAKING CARE OF THIS PATIENT: *30 minutes.  >50% time spent on counselling and coordination of care  POSSIBLE D/C IN *2-3* DAYS, DEPENDING ON CLINICAL CONDITION.  Note: This dictation was prepared with  Dragon dictation along with smaller Company secretary. Any transcriptional errors that result from this process are  unintentional.  Fritzi Mandes M.D on 02/17/2019 at 11:55 AM  Between 7am to 6pm - Pager - [phone]  After 6pm go to [email or web address] - password [email or web address] [facility]  Office  [phone]  CC: Primary care physician; System, Pcp Not InPatient ID: Caroline Hernandez, female   DOB: 1946-01-31, 73 y.o.   MRN: [id number]

## 2019-02-17 NOTE — Progress Notes (Signed)
CCPD Tx End  1161mL removed over night. Avg dwell time 2 hours.     02/17/19 1030  Peritoneal Catheter Left lower abdomen Continuous ambulatory  Placement Date/Time: 10/25/18 1350   Procedural Verification: Medical records & consent reviewed  Time out: Correct Patient;Correct Site;Correct Procedure;Special equipment/requirements available  Person Inserting Catheter: Dr. Delana Meyer  Catheter Locat...  Site Assessment Clean;Dry;Intact  Drainage Description None  Catheter status Accessed;Deaccessed  Dressing Gauze/Drain sponge  Dressing Status Clean;Dry;Intact  Completion  Weight after Drain 234 lb 2.1 oz (106.2 kg)  Effluent Appearance Clear;Yellow  Treatment Status Complete  Fluid Balance - CCPD  Total Output for Exchanges (mL) 1181 ml  Procedure Comments  Tolerated treatment well? Yes  Education / Care Plan  Dialysis Education Provided Yes  Documented Education in Care Plan Yes

## 2019-02-17 NOTE — Progress Notes (Signed)
Urology Consult Follow Up  Subjective: Overall feeling better this morning.  Nausea improving able to tolerate breakfast.  Currently on peritoneal hemodialysis.  Foley draining well overnight.  Anti-infectives: Anti-infectives (From admission, onward)   Start     Dose/Rate Route Frequency Ordered Stop   02/16/19 2200  ceFEPIme (MAXIPIME) 1 g in sodium chloride 0.9 % 100 mL IVPB  Status:  Discontinued     1 g 200 mL/hr over 30 Minutes Intravenous Every 24 hours 02/15/19 2226 02/16/19 0112   02/16/19 2200  cefTRIAXone (ROCEPHIN) 1 g in sodium chloride 0.9 % 100 mL IVPB     1 g 200 mL/hr over 30 Minutes Intravenous Every 24 hours 02/16/19 0016     02/15/19 2215  ceFEPIme (MAXIPIME) 2 g in sodium chloride 0.9 % 100 mL IVPB     2 g 200 mL/hr over 30 Minutes Intravenous  Once 02/15/19 2201 02/15/19 2309      Current Facility-Administered Medications  Medication Dose Route Frequency Provider Last Rate Last Dose  . acetaminophen (TYLENOL) tablet 650 mg  650 mg Oral Q6H PRN Mansy, Jan A, MD   650 mg at 02/16/19 1946   Or  . acetaminophen (TYLENOL) suppository 650 mg  650 mg Rectal Q6H PRN Mansy, Jan A, MD      . apixaban (ELIQUIS) tablet 5 mg  5 mg Oral BID Fritzi Mandes, MD   5 mg at 02/17/19 0845  . cefTRIAXone (ROCEPHIN) 1 g in sodium chloride 0.9 % 100 mL IVPB  1 g Intravenous Q24H Mansy, Arvella Merles, MD   Stopped at 02/16/19 2235  . dialysis solution 2.5% low-MG/low-CA dianeal solution   Intraperitoneal Q24H Candiss Norse, Harmeet, MD      . gabapentin (NEURONTIN) capsule 100 mg  100 mg Oral BID Fritzi Mandes, MD   100 mg at 02/17/19 0844  . gentamicin cream (GARAMYCIN) 0.1 % 1 application  1 application Topical Daily Murlean Iba, MD   1 application at 74/94/49 2219  . heparin 1000 unit/ml injection 500 Units  500 Units Intraperitoneal PRN Murlean Iba, MD      . insulin aspart (novoLOG) injection 0-15 Units  0-15 Units Subcutaneous TID WC Mansy, Arvella Merles, MD   8 Units at 02/17/19 0844  . insulin aspart  (novoLOG) injection 0-5 Units  0-5 Units Subcutaneous QHS Mansy, Jan A, MD      . magnesium hydroxide (MILK OF MAGNESIA) suspension 30 mL  30 mL Oral Daily PRN Mansy, Jan A, MD      . ondansetron [facility]) tablet 4 mg  4 mg Oral Q6H PRN Mansy, Jan A, MD       Or  . ondansetron [facility]) injection 4 mg  4 mg Intravenous Q6H PRN Mansy, Jan A, MD   4 mg at 02/17/19 0636     Objective: Vital signs in last 24 hours: Temp:  [97.8 F (36.6 C)-98.3 F (36.8 C)] 97.8 F (36.6 C) (06/08 1159) Pulse Rate:  [60-70] 60 (06/08 1159) Resp:  [16-18] 16 (06/08 1159) BP: (106-117)/(63-70) 117/63 (06/08 1159) SpO2:  [94 %-98 %] 94 % (06/08 1159)  Intake/Output from previous day: 06/07 0701 - 06/08 0700 In: 380 [P.O.:380] Out: 100 [Urine:100] Intake/Output this shift: Total I/O In: 120 [P.O.:120] Out: -    Physical Exam  Alert and oriented x3.  No acute distress. Abdomen mildly distended, dialysis currently taking place. Foley catheter with rust colored urine with small amount of debris but no large clots.  Appears to be draining well.  Lab Results:  Recent Labs    02/15/19 1948 02/16/19 0427  WBC 32.6* 25.1*  HGB 10.9* 9.7*  HCT 35.5* 31.8*  PLT 147* 152   BMET Recent Labs    02/15/19 1948 02/16/19 0427  NA 136 134*  K 4.4 4.9  CL 99 98  CO2 25 25  GLUCOSE 228* 187*  BUN 46* 52*  CREATININE 4.55* 4.78*  CALCIUM 8.6* 8.3*   PT/INR No results for input(s): LABPROT, INR in the last 72 hours. ABG No results for input(s): PHART, HCO3 in the last 72 hours.  Invalid input(s): PCO2, PO2  Studies/Results: Dg Chest 1 View  Result Date: 02/15/2019 CLINICAL DATA:  Right shoulder pain, nausea EXAM: CHEST  1 VIEW COMPARISON:  11/21/2018 FINDINGS: Cardiomegaly status post median sternotomy with left chest multi lead pacer defibrillator. Mild, diffuse interstitial pulmonary opacity, most conspicuous in the bilateral lung bases. IMPRESSION: Cardiomegaly. Mild, diffuse interstitial  pulmonary opacity, most conspicuous in the bilateral lung bases, likely edema. Electronically Signed   By: Eddie Candle M.D.   On: 02/15/2019 20:33   Ct Renal Stone Study  Result Date: 02/15/2019 CLINICAL DATA:  Bilateral flank pain EXAM: CT ABDOMEN AND PELVIS WITHOUT CONTRAST TECHNIQUE: Multidetector CT imaging of the abdomen and pelvis was performed following the standard protocol without IV contrast. COMPARISON:  CT chest, 03/07/2018 FINDINGS: Lower chest: Small bilateral pleural effusions and associated atelectasis or consolidation. Scattered ground-glass opacities of the included lung bases. Cardiomegaly and coronary artery calcifications. Partially imaged pacer defibrillator leads Hepatobiliary: No solid liver abnormality is seen. Small gallstones in the dependent gallbladder. Gallbladder wall thickening, or biliary dilatation. Pancreas: Unremarkable. No pancreatic ductal dilatation or surrounding inflammatory changes. Spleen: Normal in size without significant abnormality. Adrenals/Urinary Tract: Adrenal glands are unremarkable. Kidneys are normal, without renal calculi, solid lesion, or hydronephrosis. There is bladder wall emphysema and gas within the small vessels about the bladder (series 2, image 79). Stomach/Bowel: Stomach is within normal limits. Appendix appears normal. No evidence of bowel wall thickening, distention, or inflammatory changes. Vascular/Lymphatic: Pipelike calcific atherosclerosis and vascular calcinosis of the aorta and branch vessels. There are enlarged bilateral inguinal, iliac, pelvic sidewall, and retroperitoneal lymph nodes. Reproductive: No mass or other significant abnormality. Other: No abdominal wall hernia or abnormality. Trace ascites. There is a Tenckhoff type peritoneal dialysis catheter, positioned in the left paracolic gutter. Musculoskeletal: No acute or significant osseous findings. IMPRESSION: 1. There is bladder wall emphysema and gas within the small vessels  about the bladder (series 2, image 79). 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 Numerous enlarged abdominopelvic lymph nodes, likely reactive. 2. Trace ascites, likely peritoneal dialysate. There is a Tenckhoff type peritoneal dialysis catheter, positioned in the left paracolic gutter. 3. Small bilateral pleural effusions and associated atelectasis or consolidation. Scattered infectious or inflammatory ground-glass opacities of the included lung bases. 4.  Other chronic and incidental findings as detailed above. Electronically Signed   By: Eddie Candle M.D.   On: 02/15/2019 21:43    Assessment: 73 year old female admitted with emphysematous cystitis, culture pending.  Overall improving.  Hematuria also appears to be improving with rust colored urine consistent with old blood.  Plan: -Maintain Foley until tomorrow to allow urine to completely clear - Continue IV antibiotics, adjust based on urine culture data -Followed by [facility] urology, will have outpatient follow-up with her primary urologist upon discharge     LOS: 1 day    Caroline Hernandez 02/17/2019

## 2019-02-17 NOTE — Progress Notes (Signed)
[place], [place] 02/17/19  Subjective:  Patient seen at bedside. Tolerating PD well. Still has significant hematuria. Appreciate urology input.   Objective:  Vital signs in last 24 hours:  Temp:  [97.8 F (36.6 C)-98.3 F (36.8 C)] 97.8 F (36.6 C) (06/08 1159) Pulse Rate:  [60-70] 60 (06/08 1159) Resp:  [16-18] 16 (06/08 1159) BP: (106-117)/(63-70) 117/63 (06/08 1159) SpO2:  [94 %-98 %] 94 % (06/08 1159) Weight:  [106.2 kg] 106.2 kg (06/08 1159)  Weight change:  Filed Weights   02/15/19 1909 02/16/19 0104 02/17/19 1159  Weight: 93.4 kg 106.4 kg 106.2 kg    Intake/Output:    Intake/Output Summary (Last 24 hours) at 02/17/2019 1756 Last data filed at 02/17/2019 1300 Gross per 24 hour  Intake 140 ml  Output 0 ml  Net 140 ml     Physical Exam: General:  No acute distress, sitting up in bed  HEENT  anicteric, moist oral mucous membranes  Neck  supple,  Pulm/lungs [place] O2, b/l crackles  CVS/Heart irregular, no rub  Abdomen:  Soft, obese, nontender  Extremities: B/l BKA  Neurologic: Alert, oriented  Skin: No acute rashes  Access: PD catheter in place, nontender       Basic Metabolic Panel:  Recent Labs  Lab 02/15/19 1948 02/16/19 0427  NA 136 134*  K 4.4 4.9  CL 99 98  CO2 25 25  GLUCOSE 228* 187*  BUN 46* 52*  CREATININE 4.55* 4.78*  CALCIUM 8.6* 8.3*     CBC: Recent Labs  Lab 02/15/19 1948 02/16/19 0427  WBC 32.6* 25.1*  NEUTROABS 9.6*  --   HGB 10.9* 9.7*  HCT 35.5* 31.8*  MCV 91.0 90.1  PLT 147* 152      Lab Results  Component Value Date   HEPBSAG Negative 11/20/2018   HEPBSAB Non Reactive 10/22/2018   HEPBIGM Negative 10/22/2018      Microbiology:  Recent Results (from the past 240 hour(s))  SARS Coronavirus 2 (CEPHEID - Performed in [place] hospital lab), Hosp Order     Status: None   Collection Time: 02/15/19  8:44 PM  Result Value Ref Range Status   SARS Coronavirus 2 NEGATIVE NEGATIVE  Final    Comment: (NOTE) If result is NEGATIVE SARS-CoV-2 target nucleic acids are NOT DETECTED. The SARS-CoV-2 RNA is generally detectable in upper and lower  respiratory specimens during the acute phase of infection. The lowest  concentration of SARS-CoV-2 viral copies this assay can detect is 250  copies / mL. A negative result does not preclude SARS-CoV-2 infection  and should not be used as the sole basis for treatment or other  patient management decisions.  A negative result may occur with  improper specimen collection / handling, submission of specimen other  than nasopharyngeal swab, presence of viral mutation(s) within the  areas targeted by this assay, and inadequate number of viral copies  (<250 copies / mL). A negative result must be combined with clinical  observations, patient history, and epidemiological information. If result is POSITIVE SARS-CoV-2 target nucleic acids are DETECTED. The SARS-CoV-2 RNA is generally detectable in upper and lower  respiratory specimens dur ing the acute phase of infection.  Positive  results are indicative of active infection with SARS-CoV-2.  Clinical  correlation with patient history and other diagnostic information is  necessary to determine patient infection status.  Positive results do  not rule out bacterial infection or co-infection with other viruses. If result is PRESUMPTIVE POSTIVE SARS-CoV-2  nucleic acids MAY BE PRESENT.   A presumptive positive result was obtained on the submitted specimen  and confirmed on repeat testing.  While 2019 novel coronavirus  (SARS-CoV-2) nucleic acids may be present in the submitted sample  additional confirmatory testing may be necessary for epidemiological  and / or clinical management purposes  to differentiate between  SARS-CoV-2 and other Sarbecovirus currently known to infect humans.  If clinically indicated additional testing with an alternate test  methodology [id number]) is advised. The  SARS-CoV-2 RNA is generally  detectable in upper and lower respiratory sp ecimens during the acute  phase of infection. The expected result is Negative. Fact Sheet for Patients:  StrictlyIdeas.no Fact Sheet for Healthcare Providers: BankingDealers.co.za This test is not yet approved or cleared by the [place] FDA and has been authorized for detection and/or diagnosis of SARS-CoV-2 by FDA under an Emergency Use Authorization (EUA).  This EUA will remain in effect (meaning this test can be used) for the duration of the COVID-19 declaration under Section 564(b)(1) of the Act, 21 U.S.C. section 360bbb-3(b)(1), unless the authorization is terminated or revoked sooner. Performed at [facility], [address] [place], [place] [address]   Body fluid culture     Status: None (Preliminary result)   Collection Time: 02/16/19  9:55 PM  Result Value Ref Range Status   Specimen Description   Final    PERITONEAL Performed at [facility], [address] [place], [place] [address]    Special Requests   Final    NONE Performed at [facility], [address] [place], [place] [address]    Gram Stain   Final    RARE WBC PRESENT, PREDOMINANTLY MONONUCLEAR NO ORGANISMS SEEN Performed at [facility], [place] [address], [place], [place] [address]    Culture PENDING  Incomplete   Report Status PENDING  Incomplete  MRSA PCR Screening     Status: Abnormal   Collection Time: 02/17/19 12:49 PM  Result Value Ref Range Status   MRSA by PCR POSITIVE (A) NEGATIVE Final    Comment:        The GeneXpert MRSA Assay (FDA approved for NASAL specimens only), is one component of a comprehensive MRSA colonization surveillance program. It is not intended to diagnose MRSA infection nor to guide or monitor treatment for MRSA infections. RESULT CALLED TO, READ BACK BY AND VERIFIED WITH: BETH RICHARDSON AT [place] ON 02/17/19  KLM Performed at [facility], [address] [place], [place] [address]     Coagulation Studies: No results for input(s): LABPROT, INR in the last 72 hours.  Urinalysis: Recent Labs    02/15/19 2044  COLORURINE RED*  LABSPEC 1.018  PHURINE TEST NOT REPORTED DUE TO COLOR INTERFERENCE OF URINE PIGMENT  GLUCOSEU TEST NOT REPORTED DUE TO COLOR INTERFERENCE OF URINE PIGMENT*  HGBUR TEST NOT REPORTED DUE TO COLOR INTERFERENCE OF URINE PIGMENT*  BILIRUBINUR TEST NOT REPORTED DUE TO COLOR INTERFERENCE OF URINE PIGMENT*  KETONESUR TEST NOT REPORTED DUE TO COLOR INTERFERENCE OF URINE PIGMENT*  PROTEINUR TEST NOT REPORTED DUE TO COLOR INTERFERENCE OF URINE PIGMENT*  NITRITE TEST NOT REPORTED DUE TO COLOR INTERFERENCE OF URINE PIGMENT*  LEUKOCYTESUR TEST NOT REPORTED DUE TO COLOR INTERFERENCE OF URINE PIGMENT*      Imaging: Dg Chest 1 View  Result Date: 02/15/2019 CLINICAL DATA:  Right shoulder pain, nausea EXAM: CHEST  1 VIEW COMPARISON:  11/21/2018 FINDINGS: Cardiomegaly status post median sternotomy with left chest multi  lead pacer defibrillator. Mild, diffuse interstitial pulmonary opacity, most conspicuous in the bilateral lung bases. IMPRESSION: Cardiomegaly. Mild, diffuse interstitial pulmonary opacity, most conspicuous in the bilateral lung bases, likely edema. Electronically Signed   By: Eddie Candle M.D.   On: 02/15/2019 20:33   Ct Renal Stone Study  Result Date: 02/15/2019 CLINICAL DATA:  Bilateral flank pain EXAM: CT ABDOMEN AND PELVIS WITHOUT CONTRAST TECHNIQUE: Multidetector CT imaging of the abdomen and pelvis was performed following the standard protocol without IV contrast. COMPARISON:  CT chest, 03/07/2018 FINDINGS: Lower chest: Small bilateral pleural effusions and associated atelectasis or consolidation. Scattered ground-glass opacities of the included lung bases. Cardiomegaly and coronary artery calcifications. Partially imaged pacer defibrillator leads  Hepatobiliary: No solid liver abnormality is seen. Small gallstones in the dependent gallbladder. Gallbladder wall thickening, or biliary dilatation. Pancreas: Unremarkable. No pancreatic ductal dilatation or surrounding inflammatory changes. Spleen: Normal in size without significant abnormality. Adrenals/Urinary Tract: Adrenal glands are unremarkable. Kidneys are normal, without renal calculi, solid lesion, or hydronephrosis. There is bladder wall emphysema and gas within the small vessels about the bladder (series 2, image 79). Stomach/Bowel: Stomach is within normal limits. Appendix appears normal. No evidence of bowel wall thickening, distention, or inflammatory changes. Vascular/Lymphatic: Pipelike calcific atherosclerosis and vascular calcinosis of the aorta and branch vessels. There are enlarged bilateral inguinal, iliac, pelvic sidewall, and retroperitoneal lymph nodes. Reproductive: No mass or other significant abnormality. Other: No abdominal wall hernia or abnormality. Trace ascites. There is a Tenckhoff type peritoneal dialysis catheter, positioned in the left paracolic gutter. Musculoskeletal: No acute or significant osseous findings. IMPRESSION: 1. There is bladder wall emphysema and gas within the small vessels about the bladder (series 2, image 79). 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 Numerous enlarged abdominopelvic lymph nodes, likely reactive. 2. Trace ascites, likely peritoneal dialysate. There is a Tenckhoff type peritoneal dialysis catheter, positioned in the left paracolic gutter. 3. Small bilateral pleural effusions and associated atelectasis or consolidation. Scattered infectious or inflammatory ground-glass opacities of the included lung bases. 4.  Other chronic and incidental findings as detailed above. Electronically Signed   By: Eddie Candle M.D.   On: 02/15/2019 21:43     Medications:   . cefTRIAXone (ROCEPHIN)  IV Stopped (02/16/19 2235)  . dialysis solution  2.5% low-MG/low-CA     . amitriptyline  25 mg Oral QHS  . apixaban  5 mg Oral BID  . atorvastatin  40 mg Oral QHS  . [START ON 02/18/2019] Chlorhexidine Gluconate Cloth  6 each Topical Q0600  . [START ON 02/18/2019] clopidogrel  75 mg Oral Daily  . gabapentin  100 mg Oral BID  . gentamicin cream  1 application Topical Daily  . insulin aspart  0-15 Units Subcutaneous TID WC  . insulin aspart  0-5 Units Subcutaneous QHS  . iron polysaccharides  150 mg Oral Daily  . mupirocin ointment  1 application Nasal BID  . pantoprazole  40 mg Oral QHS  . sertraline  100 mg Oral QHS  . vitamin C  500 mg Oral Daily   acetaminophen **OR** acetaminophen, heparin, magnesium hydroxide, ondansetron **OR** ondansetron (ZOFRAN) IV, polyethylene glycol  Assessment/ Plan:  73 y.o. 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  End Stage Renal Disease  -Patient tolerated peritoneal dialysis well overnight.  We will plan for peritoneal dialysis tonight  as well.  2.  Anemia of chronic kidney disease with CLL and leukocytosis, Thrombocytopenia   Lab Results  Component Value Date   HGB 9.7 (L) 02/16/2019  Hemoglobin close to target at 9.7.  Continue to monitor.  3. Diabetes mellitus type II with chronic kidney disease: insulin dependent. history of poor control.  Lab Results  Component Value Date   HGBA1C 9.1 (H) 03/07/2018  Glycemic control as per hospitalist.  4.  Cardiac history: Atrial fibrillation:  -previously Amiodarone discontinued due to bradycardia Chronic systolic CHF with LVEF 35 to 40%, severe hypokinesis of septal, anteroseptal and anterior left ventricular segments, mildly dilated left atrium, moderate aortic sclerosis, mildly elevated right ventricular systolic  pressure of 15.6 mm.  Last echocardiogram October 11, 2018 -Non-STEMI November 18, 2018; cath November 19, 2017- DES placed SVG to OM1  5.  Emphysematous cystitis -IV antibiotics -Appreciate urology input.  They recommend keeping Foley catheter in place for now.  6. SHPTH Lab Results  Component Value Date   CALCIUM 8.3 (L) 02/16/2019   PHOS 2.7 10/26/2018  Phosphorus currently at target at 2.7.  Continue to monitor abdominal metabolism parameters.    LOS: 1 Berenis Corter 6/8/20205:56 PM  [place], [place]  Note: This note was prepared with Dragon dictation. Any transcription errors are unintentional

## 2019-02-17 NOTE — Progress Notes (Signed)
Inpatient Diabetes Program Recommendations  AACE/ADA: New Consensus Statement on Inpatient Glycemic Control  Target Ranges:  Prepandial:   less than 140 mg/dL      Peak postprandial:   less than 180 mg/dL (1-2 hours)      Critically ill patients:  140 - 180 mg/dL  Results for Caroline Hernandez, Caroline Hernandez (MRN [id number]) as of 02/17/2019 09:57  Ref. Range 02/16/2019 07:38 02/16/2019 12:10 02/16/2019 16:28 02/16/2019 21:37 02/17/2019 07:49  Glucose-Capillary Latest Ref Range: 70 - 99 mg/dL 151 (H) 170 (H) 125 (H) 130 (H) 255 (H)    Review of Glycemic Control  Diabetes history: DM2 Outpatient Diabetes medications: Lantus 15 units BID, Novolog 10 units with breakfast, Novolog 5 units with lunch, Novolog 10 units with supper Current orders for Inpatient glycemic control: Novolog 0-15 units TID with meals, Novolog 0-5 units QHS  Inpatient Diabetes Program Recommendations:   Insulin - Basal: Fasting glucose 255 mg/dl today. If glucose continues to be greater than 180 mg/dl, please consider ordering Lantus 10 units Q24H.  Thanks, Barnie Alderman, RN, MSN, CDE Diabetes Coordinator Inpatient Diabetes Program [phone] (Team Pager from 8am to 5pm)

## 2019-02-18 DIAGNOSIS — R109 Unspecified abdominal pain: Secondary | ICD-10-CM | POA: Diagnosis not present

## 2019-02-18 DIAGNOSIS — N3001 Acute cystitis with hematuria: Secondary | ICD-10-CM | POA: Diagnosis not present

## 2019-02-18 LAB — GLUCOSE, CAPILLARY
Glucose-Capillary: 260 mg/dL — ABNORMAL HIGH (ref 70–99)
Glucose-Capillary: 224 mg/dL — ABNORMAL HIGH (ref 70–99)

## 2019-02-18 LAB — URINE CULTURE: Culture: <10000 — AB

## 2019-02-18 MED ORDER — LOSARTAN POTASSIUM 100 MG PO TABS: 50.0000 mg | ORAL_TABLET | Freq: Every day | ORAL | 0 refills | Status: DC

## 2019-02-18 MED ORDER — CEPHALEXIN 250 MG PO CAPS
250.0000 mg | ORAL_CAPSULE | ORAL | Status: DC
  Administered 2019-02-18: 250 mg via ORAL
  Filled 2019-02-18: qty 1

## 2019-02-18 MED ORDER — ALUM & MAG HYDROXIDE-SIMETH 200-200-20 MG/5ML PO SUSP
30.0000 mL | Freq: Four times a day (QID) | ORAL | Status: DC | PRN
  Administered 2019-02-18: 30 mL via ORAL
  Filled 2019-02-18: qty 30

## 2019-02-18 MED ORDER — CEPHALEXIN 250 MG PO CAPS: 250.0000 mg | ORAL_CAPSULE | ORAL | 0 refills | Status: DC

## 2019-02-18 NOTE — Progress Notes (Signed)
CCPD completed without issue. UF 852mL. No alarms. Patient tolerated well. PD catheter capped/clamped.

## 2019-02-18 NOTE — TOC Transition Note (Signed)
Transition of Care [facility]) - CM/SW Discharge Note   Patient Details  Name: Caroline Hernandez MRN: [id number] Date of Birth: [birth year]  [facility] CM/SW Contact:  Beverly Sessions, RN Phone Number: 02/18/2019, 3:15 PM   Clinical Narrative:    Patient admitted with UTI.  Patient lives at home with daughter. Patient PD patient. Patient has chronic O2 through [employer].  Patient states that she has all of the medication equipment in the home that she needs.   PCP Shake at [facility].  Follow up appointment scheduled for 02/21/19.    Pharmacy [facility].  Patient denies issues obtaining medications or with transportation.    Elvera Bicker dialysis liaison notified of discharge  Per MD and patient home health services not indicated at discharge.    Final next level of care: Home/Self Care Barriers to Discharge: Barriers Resolved   Patient Goals and CMS Choice        Discharge Placement                       Discharge Plan and Services                                     Social Determinants of Health (SDOH) Interventions     Readmission Risk Interventions Readmission Risk Prevention Plan 02/18/2019  Transportation Screening Complete  PCP or Specialist appointment within 3-5 days of discharge Complete  HRI or [place] (No Data)  SW Recovery Care/Counseling Consult (No Data)  Palliative Care Screening Not [place] Not Applicable  Some recent data might be hidden

## 2019-02-18 NOTE — Progress Notes (Signed)
Pt discharged per MD order. IV removed. Discharge instructions reviewed with pt. Pt verbalized understanding. Pt taken downstairs in wheelchair by staff.  ?

## 2019-02-18 NOTE — Discharge Instructions (Signed)
Patient advised to hold blood pressure medications if her systolic blood pressure is less than 120

## 2019-02-18 NOTE — Discharge Summary (Signed)
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 Amputation of left lower extremity below knee ([place])   . Amputation of right lower extremity below knee ([place])   . CAD (coronary artery disease)    a. 2004 Cardiac Arrest/CABG x 3 (LIMA->LAD, VG->OM, VG->RCA);  b. 06/2015 lexiscan MV: no significant ischemia, EF 48%, low risk->Med Rx.  . Chronic combined systolic and diastolic CHF (congestive heart failure) ([place])    a. 12/2014 Echo: EF 45-50%;  b. 08/2015 Echo: EF 45-50%, ant, antsept HK, mildly dil LA, nl RV, mild-mod TR, sev PAH (35mmHg); b. 08/2017 TEE: EF 40-45%, large PFO w/ L->R shunting.  . CKD (chronic kidney disease), stage IV ([place])   . CLL (chronic lymphocytic leukemia) ([place])   . Diabetes mellitus without complication ([place])   . Essential hypertension   . GERD (gastroesophageal reflux disease)   . Hiatal hernia   . History of cardiac arrest    a. 2004.  Marland Kitchen Hyperlipidemia   . Ischemic cardiomyopathy    a. s/p MDT ICD (originally had 0350 lead-->gen change and lead revision ~ 2012 @ [place]); b. 12/2014 Echo: EF 45-50%;  c.08/2015 Echo: EF 45-50%; d. 08/2017 Echo: EF 40-45%.  Marland Kitchen PAD (peripheral artery disease) ([facility])    a. s/p bilat BKA  . Persistent atrial fibrillation    a. Dx 12/2014.  CHA2DS2VASc = 6--> warfarin;  b. 09/2015 s/p DCCV-->on amio; c. s/p DCCV  04/25/16; d. 07/2016 s/p RFCA/PVI in setting of recurrent afib despite amio; e. 05/2018 Recurrent afib.    HOSPITAL COURSE:  PhyllisWhiteis a73 y.o.Caucasian femalewith a known history of multiple medical problems that would be mentioned below, who presented to the emergency room with onset of left-sided flank and right-sided lower back pain with associated vomiting most of the day today without fever or chills. She has been feeling bad over the last 3 days.    1. UTI/Acute Cystitis -continue antibiotic therapy with IV Rocephin - urine culture and sensitivity--insignificant growth. 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 -Dr. Junious Silk urology consult appreciated. 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 She follows with hematology oncology at [facility] along with urology as well. -Patient underwent TURBT and bilateral retrograde Polygram in October 2019. -Foley catheter was placed by urology. Ok to remove foley per Dr Erlene Quan  She will have outpatient follow-up with her urology at [facility]. -No fever. Petrak count normal.  2. End-stage renal disease on peritoneal dialysis. -Nephrology follow-up consultation with Dr Candiss Norse -PD resumed  3. Atrial fibrillation with controlled ventricular response.  -continue Eliquis and Lopressor.  4. Coronary artery disease. - continue Lopressor and Imdur as well as Plavix.  5. Dyslipidemia.Statin therapy will be resumed.  6. Hypertension. Continue amlodipine and Lopressor.  7. DVT prophylaxis. Continue Eliquis  Overall improving. Patient agreeable to go home. She will follow-up with  urology at [facility].  CONSULTS OBTAINED:  Treatment Team:  Murlean Iba, MD Festus Aloe, MD Hollice Espy, MD  DRUG ALLERGIES:   Allergies  Allergen Reactions  . Piperacillin Other (See Comments)    Renal  failure  . 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 See ID note**PER PT-CAUSED RENAL FAILURE**  . Zosyn [Piperacillin Sod-Tazobactam So] Other (See Comments)    Renal failure    DISCHARGE MEDICATIONS:   Allergies as of 02/18/2019      Reactions   Piperacillin Other (See Comments)   Renal failure   Piperacillin-tazobactam In Dex Other (See Comments)   Possible AIN in 2008??? See ID note**PER PT-CAUSED RENAL FAILURE**   Zosyn [piperacillin Sod-tazobactam So] Other (See Comments)   Renal failure      Medication List    TAKE these medications   amitriptyline 25 MG tablet Commonly known as:  ELAVIL Take 25 mg by mouth at bedtime.   amLODipine 10 MG tablet Commonly known as:  NORVASC TAKE 1 TABLET BY MOUTH DAILY Notes to patient:  Hold if blood pressure less than 120   apixaban 5 MG Tabs tablet Commonly known as:  Eliquis Take 1 tablet (5 mg total) by mouth 2 (two) times daily.   atorvastatin 40 MG tablet Commonly known as:  LIPITOR Take 40 mg by mouth at bedtime.   cephALEXin 250 MG capsule Commonly known as:  KEFLEX Take 1 capsule (250 mg total) by mouth daily.   clopidogrel 75 MG tablet Commonly known as:  PLAVIX Take 1 tablet (75 mg) by mouth once daily   gabapentin 100 MG capsule Commonly known as:  NEURONTIN Take 100 mg by mouth 2 (two) times daily.   HM Vitamin D3 100 MCG (4000 UT) Caps Generic drug:  Cholecalciferol Take 4,000 Units by mouth daily.   insulin aspart 100 UNIT/ML injection Commonly known as:  novoLOG Inject 5-10 Units into the skin 3 (three) times daily before meals. 10 units into the skin before breakfast then 5 units before lunch then 10 units before supper (evening meal)   insulin glargine 100 UNIT/ML injection Commonly known as:  LANTUS Inject 0.15 mLs (15 Units total) into the skin 2 (two) times daily.   iron polysaccharides 150 MG capsule Commonly known as:  NIFEREX Take 150 mg by mouth daily.    isosorbide mononitrate 30 MG 24 hr tablet Commonly known as:  IMDUR Take 1 tablet (30 mg total) by mouth daily for 30 days.   losartan 100 MG tablet Commonly known as:  COZAAR Take 0.5 tablets (50 mg total) by mouth daily. Hold if BP <120 What changed:  See the new instructions. Notes to patient:  Hold if blood pressure less than 120   metoprolol tartrate 25 MG tablet Commonly known as:  LOPRESSOR Take 0.5 tablets (12.5 mg total) by mouth 2 (two) times daily for 30 days. Notes to patient:  Hold if blood pressure less than 120   nitroGLYCERIN 0.4 MG SL tablet Commonly known as:  NITROSTAT Place 1 tablet (0.4 mg total) under the tongue every 5 (five) minutes x 3 doses as needed for chest pain.   pantoprazole 40 MG tablet Commonly known as:  PROTONIX Take 40 mg by mouth at bedtime.   sertraline 100 MG tablet Commonly known as:  ZOLOFT Take 100 mg by mouth at bedtime.   torsemide 20 MG tablet Commonly known as:  DEMADEX Take 2 tablets (40 mg total) by mouth 2 (two)  times daily.   vitamin C 500 MG tablet Commonly known as:  ASCORBIC ACID Take 500 mg by mouth daily.   Viteyes AREDS Formula/Lutein Caps Take 1 capsule by mouth 2 (two) times daily.       If you experience worsening of your admission symptoms, develop shortness of breath, life threatening emergency, suicidal or homicidal thoughts you must seek medical attention immediately by calling 911 or calling your MD immediately  if symptoms less severe.  You Must read complete instructions/literature along with all the possible adverse reactions/side effects for all the Medicines you take and that have been prescribed to you. Take any new Medicines after you have completely understood and accept all the possible adverse reactions/side effects.   Please note  You were cared for by a hospitalist during your hospital stay. If you have any questions about your discharge medications or the care you received while you were in  the hospital after you are discharged, you can call the unit and asked to speak with the hospitalist on call if the hospitalist that took care of you is not available. Once you are discharged, your primary care physician will handle any further medical issues. Please note that NO REFILLS for any discharge medications will be authorized once you are discharged, as it is imperative that you return to your primary care physician (or establish a relationship with a primary care physician if you do not have one) for your aftercare needs so that they can reassess your need for medications and monitor your lab values. Today   SUBJECTIVE   No new complaints. No fever. Eating better.  VITAL SIGNS:  Blood pressure 117/73, pulse 68, temperature (!) 97.5 F (36.4 C), temperature source Oral, resp. rate 18, height 5\' 8"  (1.727 m), weight 105 kg, SpO2 92 %.  I/O:    Intake/Output Summary (Last 24 hours) at 02/18/2019 1157 Last data filed at 02/18/2019 0739 Gross per 24 hour  Intake 220 ml  Output 1210 ml  Net -990 ml    PHYSICAL EXAMINATION:  GENERAL:  73 y.o.-year-old patient lying in the bed with no acute distress.  EYES: Pupils equal, round, reactive to light and accommodation. No scleral icterus. Extraocular muscles intact.  HEENT: Head atraumatic, normocephalic. Oropharynx and nasopharynx clear.  NECK:  Supple, no jugular venous distention. No thyroid enlargement, no tenderness.  LUNGS: Normal breath sounds bilaterally, no wheezing, rales,rhonchi or crepitation. No use of accessory muscles of respiration.  CARDIOVASCULAR: S1, S2 normal. No murmurs, rubs, or gallops.  ABDOMEN: Soft, non-tender, non-distended. Bowel sounds present. No organomegaly or mass.  EXTREMITIES: No pedal edema, cyanosis, or clubbing. Bilateral below knee amputation stump stable NEUROLOGIC: Cranial nerves II through XII are intact. Muscle strength 5/5 in all extremities. Sensation intact. Gait not checked.  PSYCHIATRIC: The  patient is alert and oriented x 3.  SKIN: No obvious rash, lesion, or ulcer.   DATA REVIEW:   CBC  Recent Labs  Lab 02/16/19 0427  WBC 25.1*  HGB 9.7*  HCT 31.8*  PLT 152    Chemistries  Recent Labs  Lab 02/15/19 1948 02/16/19 0427  NA 136 134*  K 4.4 4.9  CL 99 98  CO2 25 25  GLUCOSE 228* 187*  BUN 46* 52*  CREATININE 4.55* 4.78*  CALCIUM 8.6* 8.3*  AST 26  --   ALT 10  --   ALKPHOS 77  --   BILITOT 1.3*  --     Microbiology Results   Recent Results (from the  past 240 hour(s))  SARS Coronavirus 2 (CEPHEID - Performed in [facility] hospital lab), Hosp Order     Status: None   Collection Time: 02/15/19  8:44 PM  Result Value Ref Range Status   SARS Coronavirus 2 NEGATIVE NEGATIVE Final    Comment: (NOTE) If result is NEGATIVE SARS-CoV-2 target nucleic acids are NOT DETECTED. The SARS-CoV-2 RNA is generally detectable in upper and lower  respiratory specimens during the acute phase of infection. The lowest  concentration of SARS-CoV-2 viral copies this assay can detect is 250  copies / mL. A negative result does not preclude SARS-CoV-2 infection  and should not be used as the sole basis for treatment or other  patient management decisions.  A negative result may occur with  improper specimen collection / handling, submission of specimen other  than nasopharyngeal swab, presence of viral mutation(s) within the  areas targeted by this assay, and inadequate number of viral copies  (<250 copies / mL). A negative result must be combined with clinical  observations, patient history, and epidemiological information. If result is POSITIVE SARS-CoV-2 target nucleic acids are DETECTED. The SARS-CoV-2 RNA is generally detectable in upper and lower  respiratory specimens dur ing the acute phase of infection.  Positive  results are indicative of active infection with SARS-CoV-2.  Clinical  correlation with patient history and other diagnostic information is  necessary  to determine patient infection status.  Positive results do  not rule out bacterial infection or co-infection with other viruses. If result is PRESUMPTIVE POSTIVE SARS-CoV-2 nucleic acids MAY BE PRESENT.   A presumptive positive result was obtained on the submitted specimen  and confirmed on repeat testing.  While 2019 novel coronavirus  (SARS-CoV-2) nucleic acids may be present in the submitted sample  additional confirmatory testing may be necessary for epidemiological  and / or clinical management purposes  to differentiate between  SARS-CoV-2 and other Sarbecovirus currently known to infect humans.  If clinically indicated additional testing with an alternate test  methodology [id number]) is advised. The SARS-CoV-2 RNA is generally  detectable in upper and lower respiratory sp ecimens during the acute  phase of infection. The expected result is Negative. Fact Sheet for Patients:  StrictlyIdeas.no Fact Sheet for Healthcare Providers: BankingDealers.co.za This test is not yet approved or cleared by the [place] FDA and has been authorized for detection and/or diagnosis of SARS-CoV-2 by FDA under an Emergency Use Authorization (EUA).  This EUA will remain in effect (meaning this test can be used) for the duration of the COVID-19 declaration under Section 564(b)(1) of the Act, 21 U.S.C. section 360bbb-3(b)(1), unless the authorization is terminated or revoked sooner. Performed at [facility], [address] [place], [place] [address]   Body fluid culture     Status: None (Preliminary result)   Collection Time: 02/16/19  9:55 PM  Result Value Ref Range Status   Specimen Description   Final    PERITONEAL Performed at [facility], [address], [place], [place] [address]    Special Requests   Final    NONE Performed at [facility], [address] [place], [place] [address]    Gram Stain   Final     RARE WBC PRESENT, PREDOMINANTLY MONONUCLEAR NO ORGANISMS SEEN    Culture   Final    NO GROWTH 1 DAY Performed at [facility], [address] [place], [place] [address]    Report Status PENDING  Incomplete  Urine Culture  Status: Abnormal   Collection Time: 02/16/19 10:00 PM  Result Value Ref Range Status   Specimen Description   Final    URINE, RANDOM Performed at [facility], [address], [place], [place] [address]    Special Requests   Final    NONE Performed at [facility], [address] [place], [place] [address]    Culture (A)  Final    <10,000 COLONIES/mL INSIGNIFICANT GROWTH Performed at [facility] [address], [place], [place] [address]    Report Status 02/18/2019 FINAL  Final  MRSA PCR Screening     Status: Abnormal   Collection Time: 02/17/19 12:49 PM  Result Value Ref Range Status   MRSA by PCR POSITIVE (A) NEGATIVE Final    Comment:        The GeneXpert MRSA Assay (FDA approved for NASAL specimens only), is one component of a comprehensive MRSA colonization surveillance program. It is not intended to diagnose MRSA infection nor to guide or monitor treatment for MRSA infections. RESULT CALLED TO, READ BACK BY AND VERIFIED WITH: BETH RICHARDSON AT [place] ON 02/17/19 [facility] Performed at [facility], [address], [place], [place] [address]     RADIOLOGY:  No results found.   CODE STATUS:     Code Status Orders  (From admission, onward)         Start     Ordered   02/16/19 0000  Full code  Continuous     02/16/19 0002        Code Status History    Date Active Date Inactive Code Status Order ID Comments User Context   11/19/2018 1242 11/23/2018 2255 Full Code [id number]  Gladstone Lighter, MD Inpatient   11/18/2018 2203 11/19/2018 1242 DNR [id number]  Demetrios Loll, MD ED   10/16/2018 0650 10/31/2018 1826 Full Code [id number]  Sedalia Muta, MD ED   04/14/2018 1701 04/19/2018 1446 Full Code  [id number]  Salary, Avel Peace, MD Inpatient   03/07/2018 1058 03/10/2018 1548 DNR [id number]  [name], MD ED   07/18/2016 1718 07/19/2016 1717 Full Code [id number]  Thompson Grayer, MD Inpatient   06/12/2015 1810 06/14/2015 1928 Full Code [id number]  Loletha Grayer, MD ED    Advance Directive Documentation     Most Recent Value  Type of Advance Directive  Healthcare Power of Attorney, Living will  Pre-existing out of facility DNR order (yellow form or pink MOST form)  -  "MOST" Form in Place?  -      TOTAL TIME TAKING CARE OF THIS PATIENT: *40* minutes.    Fritzi Mandes M.D on 02/18/2019 at 11:57 AM  Between 7am to 6pm - Pager - [phone] After 6pm go to [email or web address] - password [email or web address] [place] [facility]  Office  [phone]  CC: Primary care physician; System, Pcp Not In

## 2019-02-18 NOTE — Progress Notes (Signed)
[facility], [place] 02/18/19  Subjective:  Hematuria appears to be clearing. Tolerating peritoneal dialysis quite well. In good spirits today.   Objective:  Vital signs in last 24 hours:  Temp:  [97.5 F (36.4 C)-98.2 F (36.8 C)] 97.6 F (36.4 C) (06/09 1344) Pulse Rate:  [65-73] 73 (06/09 1344) Resp:  [17-18] 18 (06/09 1344) BP: (107-117)/(58-73) 115/58 (06/09 1344) SpO2:  [90 %-98 %] 90 % (06/09 1344) Weight:  [105 kg] 105 kg (06/09 0500)  Weight change:  Filed Weights   02/16/19 0104 02/17/19 1159 02/18/19 0500  Weight: 106.4 kg 106.2 kg 105 kg    Intake/Output:    Intake/Output Summary (Last 24 hours) at 02/18/2019 1422 Last data filed at 02/18/2019 0739 Gross per 24 hour  Intake 220 ml  Output 1210 ml  Net -990 ml     Physical Exam: General:  No acute distress, sitting up in bed  HEENT  anicteric, moist oral mucous membranes  Neck  supple,  Pulm/lungs  CTAB normal effort  CVS/Heart  irregular, no rub  Abdomen:   Soft, obese, nontender  Extremities:  B/l BKA  Neurologic:  Alert, oriented  Skin:  No acute rashes  Access:  PD catheter in place, nontender       Basic Metabolic Panel:  Recent Labs  Lab 02/15/19 1948 02/16/19 0427  NA 136 134*  K 4.4 4.9  CL 99 98  CO2 25 25  GLUCOSE 228* 187*  BUN 46* 52*  CREATININE 4.55* 4.78*  CALCIUM 8.6* 8.3*     CBC: Recent Labs  Lab 02/15/19 1948 02/16/19 0427  WBC 32.6* 25.1*  NEUTROABS 9.6*  --   HGB 10.9* 9.7*  HCT 35.5* 31.8*  MCV 91.0 90.1  PLT 147* 152      Lab Results  Component Value Date   HEPBSAG Negative 11/20/2018   HEPBSAB Non Reactive 10/22/2018   HEPBIGM Negative 10/22/2018      Microbiology:  Recent Results (from the past 240 hour(s))  SARS Coronavirus 2 (CEPHEID - Performed in [place] hospital lab), Hosp Order     Status: None   Collection Time: 02/15/19  8:44 PM  Result Value Ref Range Status   SARS Coronavirus 2 NEGATIVE NEGATIVE  Final    Comment: (NOTE) If result is NEGATIVE SARS-CoV-2 target nucleic acids are NOT DETECTED. The SARS-CoV-2 RNA is generally detectable in upper and lower  respiratory specimens during the acute phase of infection. The lowest  concentration of SARS-CoV-2 viral copies this assay can detect is 250  copies / mL. A negative result does not preclude SARS-CoV-2 infection  and should not be used as the sole basis for treatment or other  patient management decisions.  A negative result may occur with  improper specimen collection / handling, submission of specimen other  than nasopharyngeal swab, presence of viral mutation(s) within the  areas targeted by this assay, and inadequate number of viral copies  (<250 copies / mL). A negative result must be combined with clinical  observations, patient history, and epidemiological information. If result is POSITIVE SARS-CoV-2 target nucleic acids are DETECTED. The SARS-CoV-2 RNA is generally detectable in upper and lower  respiratory specimens dur ing the acute phase of infection.  Positive  results are indicative of active infection with SARS-CoV-2.  Clinical  correlation with patient history and other diagnostic information is  necessary to determine patient infection status.  Positive results do  not rule out bacterial infection or co-infection with other viruses.  If result is PRESUMPTIVE POSTIVE SARS-CoV-2 nucleic acids MAY BE PRESENT.   A presumptive positive result was obtained on the submitted specimen  and confirmed on repeat testing.  While 2019 novel coronavirus  (SARS-CoV-2) nucleic acids may be present in the submitted sample  additional confirmatory testing may be necessary for epidemiological  and / or clinical management purposes  to differentiate between  SARS-CoV-2 and other Sarbecovirus currently known to infect humans.  If clinically indicated additional testing with an alternate test  methodology [phone]) is advised. The  SARS-CoV-2 RNA is generally  detectable in upper and lower respiratory sp ecimens during the acute  phase of infection. The expected result is Negative. Fact Sheet for Patients:  StrictlyIdeas.no Fact Sheet for Healthcare Providers: BankingDealers.co.za This test is not yet approved or cleared by the [place] FDA and has been authorized for detection and/or diagnosis of SARS-CoV-2 by FDA under an Emergency Use Authorization (EUA).  This EUA will remain in effect (meaning this test can be used) for the duration of the COVID-19 declaration under Section 564(b)(1) of the Act, 21 U.S.C. section 360bbb-3(b)(1), unless the authorization is terminated or revoked sooner. Performed at [facility], [address] [place], [place] [address]   Body fluid culture     Status: None (Preliminary result)   Collection Time: 02/16/19  9:55 PM  Result Value Ref Range Status   Specimen Description   Final    PERITONEAL Performed at [facility], [address] [place], [place] [address]    Special Requests   Final    NONE Performed at [facility], [address] [place], [place] [address]    Gram Stain   Final    RARE WBC PRESENT, PREDOMINANTLY MONONUCLEAR NO ORGANISMS SEEN    Culture   Final    NO GROWTH 1 DAY Performed at [facility], [place] [address] [place], [place] [address]    Report Status PENDING  Incomplete  Urine Culture     Status: Abnormal   Collection Time: 02/16/19 10:00 PM  Result Value Ref Range Status   Specimen Description   Final    URINE, RANDOM Performed at [facility], [address] [place], [place] [address]    Special Requests   Final    NONE Performed at [facility], [address] [place], [place] [address]    Culture (A)  Final    <10,000 COLONIES/mL INSIGNIFICANT GROWTH Performed at [facility], [place] [address], [place], [place]  [address]    Report Status 02/18/2019 FINAL  Final  MRSA PCR Screening     Status: Abnormal   Collection Time: 02/17/19 12:49 PM  Result Value Ref Range Status   MRSA by PCR POSITIVE (A) NEGATIVE Final    Comment:        The GeneXpert MRSA Assay (FDA approved for NASAL specimens only), is one component of a comprehensive MRSA colonization surveillance program. It is not intended to diagnose MRSA infection nor to guide or monitor treatment for MRSA infections. RESULT CALLED TO, READ BACK BY AND VERIFIED WITH: BETH RICHARDSON AT [place] ON 02/17/19 KLM Performed at [facility], [address] [place], [place] [address]     Coagulation Studies: No results for input(s): LABPROT, INR in the last 72 hours.  Urinalysis: Recent Labs    02/15/19 2044  COLORURINE RED*  LABSPEC 1.018  PHURINE TEST NOT REPORTED DUE TO COLOR INTERFERENCE OF URINE PIGMENT  GLUCOSEU TEST  NOT REPORTED DUE TO COLOR INTERFERENCE OF URINE PIGMENT*  HGBUR TEST NOT REPORTED DUE TO COLOR INTERFERENCE OF URINE PIGMENT*  BILIRUBINUR TEST NOT REPORTED DUE TO COLOR INTERFERENCE OF URINE PIGMENT*  KETONESUR TEST NOT REPORTED DUE TO COLOR INTERFERENCE OF URINE PIGMENT*  PROTEINUR TEST NOT REPORTED DUE TO COLOR INTERFERENCE OF URINE PIGMENT*  NITRITE TEST NOT REPORTED DUE TO COLOR INTERFERENCE OF URINE PIGMENT*  LEUKOCYTESUR TEST NOT REPORTED DUE TO COLOR INTERFERENCE OF URINE PIGMENT*      Imaging: No results found.   Medications:   . dialysis solution 2.5% low-MG/low-CA     . amitriptyline  25 mg Oral QHS  . apixaban  5 mg Oral BID  . atorvastatin  40 mg Oral QHS  . cephALEXin  250 mg Oral Q24H  . Chlorhexidine Gluconate Cloth  6 each Topical Q0600  . clopidogrel  75 mg Oral Daily  . gabapentin  100 mg Oral BID  . gentamicin cream  1 application Topical Daily  . insulin aspart  0-15 Units Subcutaneous TID WC  . insulin aspart  0-5 Units Subcutaneous QHS  . iron polysaccharides  150 mg Oral Daily   . mupirocin ointment  1 application Nasal BID  . pantoprazole  40 mg Oral QHS  . sertraline  100 mg Oral QHS  . vitamin C  500 mg Oral Daily   acetaminophen **OR** acetaminophen, alum & mag hydroxide-simeth, heparin, magnesium hydroxide, ondansetron **OR** ondansetron (ZOFRAN) IV, polyethylene glycol  Assessment/ Plan:  72 y.o. 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  End Stage Renal Disease  -Patient has been tolerating peritoneal dialysis well.  Continue to monitor progress as an outpatient..  2.  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  3. Diabetes mellitus type II with chronic kidney disease: insulin dependent. history of poor control.  Lab Results  Component Value Date   HGBA1C 9.1 (H) 03/07/2018  Hemoglobin A1c still high at 9.1.  She will need continued glycemic control monitoring as an outpatient as well.  4.  Cardiac history: Atrial fibrillation:  -previously Amiodarone discontinued due to bradycardia Chronic systolic CHF with LVEF 35 to 40%, severe hypokinesis of septal, anteroseptal and anterior left ventricular segments, mildly dilated left atrium, moderate aortic sclerosis, mildly elevated right ventricular systolic pressure of 19.1 mm.  Last echocardiogram October 11, 2018 -Non-STEMI November 18, 2018; cath November 19, 2017- DES placed SVG to OM1  5.  Emphysematous cystitis -IV antibiotics, hematuria clearing. -Appreciate urology input.  They recommend keeping Foley catheter in place for now.  6. SHPTH Lab Results  Component Value Date   CALCIUM 8.3 (L) 02/16/2019   PHOS 2.7  10/26/2018  Continue to monitor phosphorus, calcium, and PTH as an outpatient.    LOS: 2 Usama Harkless 6/9/20202:22 PM  [place]  Note: This note was prepared with Dragon dictation. Any transcription errors are unintentional

## 2019-02-20 LAB — BODY FLUID CULTURE: Culture: NO GROWTH

## 2019-02-22 ENCOUNTER — Other Ambulatory Visit: Payer: Self-pay | Admitting: Cardiovascular Disease

## 2019-02-24 ENCOUNTER — Telehealth: Payer: Self-pay

## 2019-02-24 MED ORDER — TORSEMIDE 20 MG PO TABS: 40.0000 mg | ORAL_TABLET | Freq: Two times a day (BID) | ORAL | 0 refills | Status: DC

## 2019-02-24 NOTE — Telephone Encounter (Signed)
Requested Prescriptions   Signed Prescriptions Disp Refills  . torsemide (DEMADEX) 20 MG tablet 360 tablet 0    Sig: Take 2 tablets (40 mg total) by mouth 2 (two) times daily.    Authorizing Provider: Minna Merritts    Ordering User: Raelene Bott, Abigial Newville L

## 2019-06-02 ENCOUNTER — Other Ambulatory Visit: Payer: Self-pay | Admitting: Cardiovascular Disease

## 2019-06-02 ENCOUNTER — Telehealth: Payer: Self-pay

## 2019-06-02 MED ORDER — TORSEMIDE 20 MG PO TABS: 40.0000 mg | ORAL_TABLET | Freq: Two times a day (BID) | ORAL | 0 refills | Status: DC

## 2019-06-02 NOTE — Telephone Encounter (Signed)
Pt needs to contact office for further refills. 2 week supply or torsemide 20 mg. sent to pharmacy.

## 2019-06-06 ENCOUNTER — Other Ambulatory Visit: Payer: Self-pay | Admitting: Cardiovascular Disease

## 2019-06-18 ENCOUNTER — Other Ambulatory Visit: Payer: Self-pay | Admitting: Cardiovascular Disease

## 2019-06-19 NOTE — Progress Notes (Deleted)
Cardiology Office Note    Date:  06/19/2019   ID:  Caroline Hernandez, DOB [birth year], MRN [id number]  PCP:  System, Pcp Not In  Cardiologist:  Ida Rogue, MD  Electrophysiologist:  None   Chief Complaint: Hospital follow-up  History of Present Illness:   Caroline Hernandez is a 73 y.o. female with history of CAD with cardiac arrest status post three-vessel CABG in 2004 with LIMA to LAD, SVG to OM, and SVG to RCA status post subsequent PCI to vein graft to OM in 11/2018, permanent A. fib diagnosed in 2016 on Eliquis (previously on Coumadin) status post DCCV in 09/2015, 04/2016 with Mobitz type I noted on post tracing status post RFCA in 07/2016 with recurrent A. fib status post repeat TEE guided DCCV in 09/2016 with recurrent arrhythmia and has been maintained in A. fib since, HFpEF secondary to ICM status post ICD, pulmonary hypertension CLL, ESRD on PD, anemia of chronic disease, PAD status post bilateral BKA, recurrent wound infections complicated by bacteremia/Fournier's gangrene, DM2, HTN, HLD, GERD, and obesity who presents for hospital follow-up as detailed below.  [name] has known extensive/complicated past medical history as outlined above.  Prior nuclear stress test in 06/2015 showed no significant ischemia with old scar noted in the mid to distal anteroseptal and septal region with EF 48%.  Overall this was a low risk scan.  TEE in 09/2016 showed an EF of 40 to 45%, mild mitral regurgitation, mildly dilated left atrium with mild spontaneous echo contrast in the left atrial appendage, large PFO with left-to-right shunting.  Most recent echo from 09/2018 in the setting of volume overload showed an EF of 35 to 40%, nondiagnostic images for diastolic function, severe hypokinesis of the entire septal, anteroseptal, and anterior LV segments, mildly dilated left atrium, mild mitral regurgitation, mild to moderate tricuspid regurgitation, moderate sclerosis of the aortic valve, mildly enlarged RV cavity  size with normal RV systolic function, PASP 93.2 mmHg.  Patient was ultimately initiated on dialysis shortly after this study.  She was admitted to the hospital in 11/2018 with a non-STEMI with troponin peaking at 8.26 at that time.  She underwent cardiac cath on 11/20/2018 which showed known multivessel CAD with moderate diffuse disease affecting the left main, occluded proximal LAD, occluded proximal region of the LCx CTO of a high OM branch following a proximal stent, presumed occlusion of the native RCA in the proximal to mid region.  With regards to her bypass graft anatomy the LIMA to mid LAD was patent with the LAD beyond the LIMA graft being small and diffusely diseased.  The proximal to mid LAD fills by retrograde flow from the LIMA graft.  Vein graft to RCA/PDA was patent with no significant disease.  Vein graft to OM was noted to be 99% occluded at the anastomosis distal to the edge of previously placed stent.  Patient underwent successful PCI/DES to the vein graft to OM and was managed with triple therapy for 30 days followed by subsequent discontinuation of aspirin and continuation of Plavix with Eliquis.  She was admitted to the hospital from 10/14 through 10/18 with ***.  High-sensitivity troponin was not consistent with ACS with an initial value of 21 and a delta of 22.  Initially, given symptoms and further reduction in LV systolic function with an EF of 25 to 30% by echo on day of admission plans were to proceed with cardiac cath following washout of anticoagulation.  However, after being placed on a heparin drip she  developed significant vaginal bleeding leading to discontinuation of heparin and deferment of cardiac cath.  She underwent Lexiscan Myoview on 06/27/2019 which showed a large scar without ischemia with an EF estimated at 26%.  Overall, this was a high risk and given severely depressed EF and regions of old infarct.  No further ischemic evaluation was recommended at that time.  Prior  to discharge, she was restarted on Eliquis.  ***   Labs: Potassium 3.8, serum creatinine 7.53, WBC 29.7, Hgb 9.6, PLT 206, magnesium 1.9, albumin 2.3, AST/ALT normal, C. difficile negative, GI panel negative. Pathologist review of blood smear showed absolute lymphocytosis with morphologic changes consistent with known CLL. Peritoneal fluid with no growth to date. Occult blood negative.  Past Medical History:  Diagnosis Date   Amputation of left lower extremity below knee ([place])    Amputation of right lower extremity below knee ([place])    CAD (coronary artery disease)    a. 2004 Cardiac Arrest/CABG x 3 (LIMA->LAD, VG->OM, VG->RCA);  b. 06/2015 lexiscan MV: no significant ischemia, EF 48%, low risk->Med Rx.   Chronic combined systolic and diastolic CHF (congestive heart failure) ([place])    a. 12/2014 Echo: EF 45-50%;  b. 08/2015 Echo: EF 45-50%, ant, antsept HK, mildly dil LA, nl RV, mild-mod TR, sev PAH (19mmHg); b. 08/2017 TEE: EF 40-45%, large PFO w/ L->R shunting.   CKD (chronic kidney disease), stage IV (H[facility])    CLL (chronic lymphocytic leukemia) (H[facility])    Diabetes mellitus without complication ([place])    Essential hypertension    GERD (gastroesophageal reflux disease)    Hiatal hernia    History of cardiac arrest    a. 2004.   Hyperlipidemia    Ischemic cardiomyopathy    a. s/p MDT ICD (originally had 1517 lead-->gen change and lead revision ~ 2012 @ [place]); b. 12/2014 Echo: EF 45-50%;  c.08/2015 Echo: EF 45-50%; d. 08/2017 Echo: EF 40-45%.   PAD (peripheral artery disease) ([place])    a. s/p bilat BKA   Persistent atrial fibrillation    a. Dx 12/2014.  CHA2DS2VASc = 6--> warfarin;  b. 09/2015 s/p DCCV-->on amio; c. s/p DCCV 04/25/16; d. 07/2016 s/p RFCA/PVI in setting of recurrent afib despite amio; e. 05/2018 Recurrent afib.    Past Surgical History:  Procedure Laterality Date   ABDOMINAL HYSTERECTOMY     Amputation lower extremity bilaterally Bilateral    CAPD INSERTION  N/A 10/25/2018   Procedure: LAPAROSCOPIC INSERTION CONTINUOUS AMBULATORY PERITONEAL DIALYSIS  (CAPD) CATHETER;  Surgeon: Katha Cabal, MD;  Location: [facility];  Service: Vascular;  Laterality: N/A;   CARDIAC CATHETERIZATION     CARDIOVERSION N/A 10/09/2016   Procedure: CARDIOVERSION;  Surgeon: Lelon Perla, MD;  Location: [facility];  Service: Cardiovascular;  Laterality: N/A;   CORONARY ARTERY BYPASS GRAFT     CORONARY STENT INTERVENTION N/A 11/20/2018   Procedure: CORONARY STENT INTERVENTION;  Surgeon: Isaias Cowman, MD;  Location: [facility];  Service: Cardiovascular;  Laterality: N/A;  SVG- OM   DIALYSIS/PERMA CATHETER INSERTION N/A 10/22/2018   Procedure: DIALYSIS/PERMA CATHETER INSERTION;  Surgeon: Katha Cabal, MD;  Location: [facility];  Service: Cardiovascular;  Laterality: N/A;   DIALYSIS/PERMA CATHETER REMOVAL N/A 01/06/2019   Procedure: DIALYSIS/PERMA CATHETER REMOVAL;  Surgeon: Algernon Huxley, MD;  Location: [facility];  Service: Cardiovascular;  Laterality: N/A;   ELECTROPHYSIOLOGIC STUDY N/A 10/07/2015   Procedure: CARDIOVERSION;  Surgeon: Minna Merritts, MD;  Location: [facility];  Service: Cardiovascular;  Laterality: N/A;   ELECTROPHYSIOLOGIC STUDY N/A 04/25/2016   Procedure: Cardioversion;  Surgeon: Minna Merritts, MD;  Location: [facility];  Service: Cardiovascular;  Laterality: N/A;   ELECTROPHYSIOLOGIC STUDY N/A 07/18/2016   Procedure: Atrial Fibrillation Ablation;  Surgeon: Thompson Grayer, MD;  Location: [facility];  Service: Cardiovascular;  Laterality: N/A;   IMPLANTABLE CARDIOVERTER DEFIBRILLATOR IMPLANT  2005   [employer]    LEFT HEART CATH AND CORS/GRAFTS ANGIOGRAPHY N/A 11/20/2018   Procedure: LEFT HEART CATH AND CORS/GRAFTS ANGIOGRAPHY;  Surgeon: Minna Merritts, MD;  Location: [facility];  Service: Cardiovascular;  Laterality: N/A;   TEE WITHOUT CARDIOVERSION N/A 07/18/2016   Procedure:  TRANSESOPHAGEAL ECHOCARDIOGRAM (TEE);  Surgeon: Sueanne Margarita, MD;  Location: [facility];  Service: Cardiovascular;  Laterality: N/A;   TEE WITHOUT CARDIOVERSION N/A 10/09/2016   Procedure: TRANSESOPHAGEAL ECHOCARDIOGRAM (TEE);  Surgeon: Lelon Perla, MD;  Location: [facility]COPY;  Service: Cardiovascular;  Laterality: N/A;    Current Medications: No outpatient medications have been marked as taking for the 07/01/19 encounter (Appointment) with Rise Mu, PA-C.    Allergies:   Piperacillin, Piperacillin-tazobactam in dex, and Zosyn [piperacillin sod-tazobactam so]   Social History   Socioeconomic History   Marital status: Divorced    Spouse name: Not on file   Number of children: Not on file   Years of education: Not on file   Highest education level: Not on file  Occupational History   Not on file  Social Needs   Financial resource strain: Not on file   Food insecurity    Worry: Not on file    Inability: Not on file   Transportation needs    Medical: Not on file    Non-medical: Not on file  Tobacco Use   Smoking status: Never Smoker   Smokeless tobacco: Never Used  Substance and Sexual Activity   Alcohol use: No   Drug use: No   Sexual activity: Not on file  Lifestyle   Physical activity    Days per week: Not on file    Minutes per session: Not on file   Stress: Not on file  Relationships   Social connections    Talks on phone: Not on file    Gets together: Not on file    Attends religious service: Not on file    Active member of club or organization: Not on file    Attends meetings of clubs or organizations: Not on file    Relationship status: Not on file  Other Topics Concern   Not on file  Social History Narrative   Not on file     Family History:  The patient's family history includes Atrial fibrillation in her mother; CAD in her mother; Diabetes in her father and mother; Lung cancer in her father.  ROS:    ROS   EKGs/Labs/Other Studies Reviewed:    Studies reviewed were summarized above. The additional studies were reviewed today:  2D echo 06/25/2019:  1. Left ventricular ejection fraction, by visual estimation, is 25 to 30%. The left ventricle has severely decreased function. Normal left ventricular size. There is mildly increased left ventricular hypertrophy.  2. Definity contrast agent was given IV to delineate the left ventricular endocardial borders.  3. Indeterminate diastolic filling due to E-A fusion pattern of LV diastolic filling.  4. Global right ventricle has mildly reduced systolic function.The right ventricular size is mildly enlarged. Right vetricular wall thickness was not assessed.  5. Left atrial  size was moderately dilated.  6. Right atrial size was normal.  7. Moderate aortic valve annular calcification.  8. The mitral valve is grossly normal. Trace mitral valve regurgitation.  9. The tricuspid valve is grossly normal. Tricuspid valve regurgitation is mild. 10. Aortic valve mean gradient measures 3.0 mmHg. 11. Aortic valve peak gradient measures 5.4 mmHg. 12. The aortic valve is abnormal Aortic valve regurgitation was not visualized by color flow Doppler. 13. The pulmonic valve was not well visualized. Pulmonic valve regurgitation is trivial by color flow Doppler. 14. Moderately elevated pulmonary artery systolic pressure. 15. A pacer wire is visualized. __________  [id number] QIONGEX 06/27/2019: Pharmacological myocardial perfusion imaging study with no significant Ischemia. Large regions of old scar noted in the distal anteroseptal, apical, lateral and inferolateral wall.  Diffuse hypokinesis,  EF estimated at 26% No EKG changes concerning for ischemia at peak stress or in recovery. High risk scan given severely depressed EF and regions of old infarct. __________  [facility] 11/20/2018: Coronary angiography:  Coronary dominance: Right  Left mainstem: Large vessel  that bifurcates into the LAD and left circumflex, moderate diffuse disease  Left anterior descending (LAD): Moderate-sized vessel , occluded proximally  LIMA to the mid LAD is patent . LAD beyond the LIMA graft is small and diffusely diseased . Diagonal vessels small and diffusely diseased . Proximal to mid LAD fills by retrograde flow from LIMA   Left circumflex: Essentially occluded in the proximal region, very small vessel mid to distal. High OM appears to have proximal stent followed by CTO  Right coronary artery (RCA): Native RCA presumed to be occluded from proximal to mid region Vein graft to the RCA patent, large vessel, 90% disease mid PDA  SVG to the RCA/PDA: Patent, no significant disease Saphenous vein graft to OM, 99% disease at the anastomosis, distal edge of the stent  Left ventriculography: Aortic valve crossed for pressures, no significant aortic valve stenosis, left ventricular end-diastolic pressure 27  Final Conclusions:  Severe three-vessel native disease with occluded RCA, LAD, left circumflex Patent grafts LIMA to the LAD, vein graft to the OM, vein graft to the RCA Severe disease of anastomosis vein graft to the OM estimated 99%  Recommendations:  Case discussed with Dr. Aris Georgia,  He will attempt intervention on the vein graft to the OM. Very high risk procedure given essentially occluded mid to distal LAD. __________  PCI 11/20/2018:  1st Mrg lesion is 95% stenosed.  A drug-eluting stent was successfully placed using a STENT RESOLUTE ONYX 2.5X12.  Post intervention, there is a 0% residual stenosis.  1. High-grade anastomotic stenosis SVG to OM1 2. Successful PCI with DES at the anastomotic site SVG to OM1 __________  2D echo 10/11/2018: 1. The left ventricle has moderately reduced systolic function of 52-84%. The cavity size is mildly increased. There is no left ventricular wall thickness. The left ventricular diastology could not be  evaluated due to nondiagnostic images. 2. There is severe hypokinesis of the entire septal, anteroseptal and anterior left ventricular segments. 3. Mildly dilated left atrial size. 4. The mitral valve normal in structure. Regurgitation is mild by color flow Doppler. 5. Normal tricuspid valve. 6. Tricuspid regurgitation mild-moderate. 7. The aortic valve normal. There is moderate sclerosis of the aortic valve. 8. The right ventricle is mildly enlarged in size. There is normal systolic function. Right ventricular systolic pressure is mildly elevated with an estimated pressure of 42.4 mmHg. __________   EKG:  EKG is ordered today.  The EKG  ordered today demonstrates ***  Recent Labs: 10/16/2018: B Natriuretic Peptide 1,501.0 11/18/2018: Magnesium 2.0 02/15/2019: ALT 10 02/16/2019: BUN 52; Creatinine, Ser 4.78; Hemoglobin 9.7; Platelets 152; Potassium 4.9; Sodium 134  Recent Lipid Panel    Component Value Date/Time   CHOL 176 11/19/2018 0407   TRIG 109 11/19/2018 0407   HDL 40 (L) 11/19/2018 0407   CHOLHDL 4.4 11/19/2018 0407   VLDL 22 11/19/2018 0407   LDLCALC 114 (H) 11/19/2018 0407    PHYSICAL EXAM:    VS:  There were no vitals taken for this visit.  BMI: There is no height or weight on file to calculate BMI.  Physical Exam  Wt Readings from Last 3 Encounters:  02/18/19 231 lb 7.7 oz (105 kg)  01/06/19 196 lb (88.9 kg)  12/31/18 206 lb (93.4 kg)     ASSESSMENT & PLAN:   1. ***  Disposition: F/u with Dr. Rockey Situ or an APP in ***.   Medication Adjustments/Labs and Tests Ordered: Current medicines are reviewed at length with the patient today.  Concerns regarding medicines are outlined above. Medication changes, Labs and Tests ordered today are summarized above and listed in the Patient Instructions accessible in Encounters.   Signed, Christell Faith, PA-C 06/19/2019 1:52 PM     [address] [place], [place] [address] [phone]

## 2019-06-25 ENCOUNTER — Emergency Department: Payer: Medicare HMO

## 2019-06-25 ENCOUNTER — Inpatient Hospital Stay
Admission: EM | Admit: 2019-06-25 | Discharge: 2019-07-02 | DRG: 291 | Disposition: A | Discharge status: Skilled Nursing Facility | Payer: Medicare HMO | Attending: Internal Medicine | Admitting: Internal Medicine

## 2019-06-25 ENCOUNTER — Other Ambulatory Visit: Payer: Self-pay

## 2019-06-25 ENCOUNTER — Observation Stay (HOSPITAL_BASED_OUTPATIENT_CLINIC_OR_DEPARTMENT_OTHER)
Admit: 2019-06-25 | Discharge: 2019-06-25 | Disposition: A | Discharge status: Home / Self Care | Payer: Medicare HMO | Attending: Internal Medicine | Admitting: Internal Medicine

## 2019-06-25 ENCOUNTER — Observation Stay: Payer: Medicare HMO

## 2019-06-25 DIAGNOSIS — R188 Other ascites: Secondary | ICD-10-CM | POA: Diagnosis present

## 2019-06-25 DIAGNOSIS — Z7901 Long term (current) use of anticoagulants: Secondary | ICD-10-CM

## 2019-06-25 DIAGNOSIS — D631 Anemia in chronic kidney disease: Secondary | ICD-10-CM | POA: Diagnosis present

## 2019-06-25 DIAGNOSIS — I200 Unstable angina: Secondary | ICD-10-CM | POA: Diagnosis present

## 2019-06-25 DIAGNOSIS — I959 Hypotension, unspecified: Secondary | ICD-10-CM | POA: Diagnosis present

## 2019-06-25 DIAGNOSIS — Z95810 Presence of automatic (implantable) cardiac defibrillator: Secondary | ICD-10-CM

## 2019-06-25 DIAGNOSIS — Z955 Presence of coronary angioplasty implant and graft: Secondary | ICD-10-CM

## 2019-06-25 DIAGNOSIS — I4821 Permanent atrial fibrillation: Secondary | ICD-10-CM | POA: Diagnosis not present

## 2019-06-25 DIAGNOSIS — Z8674 Personal history of sudden cardiac arrest: Secondary | ICD-10-CM

## 2019-06-25 DIAGNOSIS — C9110 Chronic lymphocytic leukemia of B-cell type not having achieved remission: Secondary | ICD-10-CM | POA: Diagnosis present

## 2019-06-25 DIAGNOSIS — I5043 Acute on chronic combined systolic (congestive) and diastolic (congestive) heart failure: Secondary | ICD-10-CM | POA: Diagnosis present

## 2019-06-25 DIAGNOSIS — N185 Chronic kidney disease, stage 5: Secondary | ICD-10-CM

## 2019-06-25 DIAGNOSIS — I420 Dilated cardiomyopathy: Secondary | ICD-10-CM

## 2019-06-25 DIAGNOSIS — I361 Nonrheumatic tricuspid (valve) insufficiency: Secondary | ICD-10-CM | POA: Diagnosis not present

## 2019-06-25 DIAGNOSIS — R197 Diarrhea, unspecified: Secondary | ICD-10-CM | POA: Diagnosis present

## 2019-06-25 DIAGNOSIS — I255 Ischemic cardiomyopathy: Secondary | ICD-10-CM | POA: Diagnosis present

## 2019-06-25 DIAGNOSIS — Z90710 Acquired absence of both cervix and uterus: Secondary | ICD-10-CM

## 2019-06-25 DIAGNOSIS — I5023 Acute on chronic systolic (congestive) heart failure: Secondary | ICD-10-CM | POA: Diagnosis not present

## 2019-06-25 DIAGNOSIS — D630 Anemia in neoplastic disease: Secondary | ICD-10-CM | POA: Diagnosis present

## 2019-06-25 DIAGNOSIS — Z7902 Long term (current) use of antithrombotics/antiplatelets: Secondary | ICD-10-CM

## 2019-06-25 DIAGNOSIS — Z89512 Acquired absence of left leg below knee: Secondary | ICD-10-CM

## 2019-06-25 DIAGNOSIS — Z951 Presence of aortocoronary bypass graft: Secondary | ICD-10-CM

## 2019-06-25 DIAGNOSIS — E669 Obesity, unspecified: Secondary | ICD-10-CM | POA: Diagnosis present

## 2019-06-25 DIAGNOSIS — I132 Hypertensive heart and chronic kidney disease with heart failure and with stage 5 chronic kidney disease, or end stage renal disease: Principal | ICD-10-CM | POA: Diagnosis present

## 2019-06-25 DIAGNOSIS — I454 Nonspecific intraventricular block: Secondary | ICD-10-CM | POA: Diagnosis present

## 2019-06-25 DIAGNOSIS — N186 End stage renal disease: Secondary | ICD-10-CM | POA: Diagnosis present

## 2019-06-25 DIAGNOSIS — R079 Chest pain, unspecified: Secondary | ICD-10-CM | POA: Diagnosis not present

## 2019-06-25 DIAGNOSIS — I2510 Atherosclerotic heart disease of native coronary artery without angina pectoris: Secondary | ICD-10-CM

## 2019-06-25 DIAGNOSIS — Z6836 Body mass index (BMI) 36.0-36.9, adult: Secondary | ICD-10-CM

## 2019-06-25 DIAGNOSIS — K219 Gastro-esophageal reflux disease without esophagitis: Secondary | ICD-10-CM | POA: Diagnosis present

## 2019-06-25 DIAGNOSIS — Z20828 Contact with and (suspected) exposure to other viral communicable diseases: Secondary | ICD-10-CM | POA: Diagnosis present

## 2019-06-25 DIAGNOSIS — I2721 Secondary pulmonary arterial hypertension: Secondary | ICD-10-CM | POA: Diagnosis present

## 2019-06-25 DIAGNOSIS — Z89511 Acquired absence of right leg below knee: Secondary | ICD-10-CM

## 2019-06-25 DIAGNOSIS — N2581 Secondary hyperparathyroidism of renal origin: Secondary | ICD-10-CM | POA: Diagnosis present

## 2019-06-25 DIAGNOSIS — E1151 Type 2 diabetes mellitus with diabetic peripheral angiopathy without gangrene: Secondary | ICD-10-CM | POA: Diagnosis present

## 2019-06-25 DIAGNOSIS — Z880 Allergy status to penicillin: Secondary | ICD-10-CM

## 2019-06-25 DIAGNOSIS — E1122 Type 2 diabetes mellitus with diabetic chronic kidney disease: Secondary | ICD-10-CM | POA: Diagnosis present

## 2019-06-25 DIAGNOSIS — Z8249 Family history of ischemic heart disease and other diseases of the circulatory system: Secondary | ICD-10-CM

## 2019-06-25 DIAGNOSIS — E785 Hyperlipidemia, unspecified: Secondary | ICD-10-CM | POA: Diagnosis present

## 2019-06-25 DIAGNOSIS — I82C11 Acute embolism and thrombosis of right internal jugular vein: Secondary | ICD-10-CM | POA: Diagnosis present

## 2019-06-25 DIAGNOSIS — Q211 Atrial septal defect: Secondary | ICD-10-CM

## 2019-06-25 DIAGNOSIS — N939 Abnormal uterine and vaginal bleeding, unspecified: Secondary | ICD-10-CM | POA: Diagnosis not present

## 2019-06-25 DIAGNOSIS — I4819 Other persistent atrial fibrillation: Secondary | ICD-10-CM | POA: Diagnosis present

## 2019-06-25 DIAGNOSIS — L89312 Pressure ulcer of right buttock, stage 2: Secondary | ICD-10-CM | POA: Diagnosis present

## 2019-06-25 DIAGNOSIS — Z992 Dependence on renal dialysis: Secondary | ICD-10-CM

## 2019-06-25 DIAGNOSIS — Z794 Long term (current) use of insulin: Secondary | ICD-10-CM

## 2019-06-25 DIAGNOSIS — Z79899 Other long term (current) drug therapy: Secondary | ICD-10-CM

## 2019-06-25 DIAGNOSIS — I252 Old myocardial infarction: Secondary | ICD-10-CM

## 2019-06-25 DIAGNOSIS — I25110 Atherosclerotic heart disease of native coronary artery with unstable angina pectoris: Secondary | ICD-10-CM | POA: Diagnosis present

## 2019-06-25 DIAGNOSIS — I083 Combined rheumatic disorders of mitral, aortic and tricuspid valves: Secondary | ICD-10-CM | POA: Diagnosis present

## 2019-06-25 LAB — GLUCOSE, CAPILLARY
Glucose-Capillary: 109 mg/dL — ABNORMAL HIGH (ref 70–99)
Glucose-Capillary: 111 mg/dL — ABNORMAL HIGH (ref 70–99)
Glucose-Capillary: 84 mg/dL (ref 70–99)

## 2019-06-25 LAB — C DIFFICILE QUICK SCREEN W PCR REFLEX
C Diff antigen: NEGATIVE
C Diff interpretation: NOT DETECTED
C Diff toxin: NEGATIVE

## 2019-06-25 LAB — APTT: aPTT: 48 seconds — ABNORMAL HIGH (ref 24–36)

## 2019-06-25 LAB — CBC
HCT: 35.9 % — ABNORMAL LOW (ref 36.0–46.0)
Hemoglobin: 11.2 g/dL — ABNORMAL LOW (ref 12.0–15.0)
MCH: 26.4 pg (ref 26.0–34.0)
MCHC: 31.2 g/dL (ref 30.0–36.0)
MCV: 84.7 fL (ref 80.0–100.0)
Platelets: 151 10*3/uL (ref 150–400)
RBC: 4.24 MIL/uL (ref 3.87–5.11)
RDW: 17.5 % — ABNORMAL HIGH (ref 11.5–15.5)
WBC: 24.2 10*3/uL — ABNORMAL HIGH (ref 4.0–10.5)
nRBC: 0 % (ref 0.0–0.2)

## 2019-06-25 LAB — BASIC METABOLIC PANEL
Anion gap: 20 — ABNORMAL HIGH (ref 5–15)
BUN: 57 mg/dL — ABNORMAL HIGH (ref 8–23)
CO2: 24 mmol/L (ref 22–32)
Calcium: 7.6 mg/dL — ABNORMAL LOW (ref 8.9–10.3)
Chloride: 91 mmol/L — ABNORMAL LOW (ref 98–111)
Creatinine, Ser: 8.2 mg/dL — ABNORMAL HIGH (ref 0.44–1.00)
GFR calc Af Amer: 5 mL/min — ABNORMAL LOW (ref >60)
GFR calc non Af Amer: 4 mL/min — ABNORMAL LOW (ref >60)
Glucose, Bld: 183 mg/dL — ABNORMAL HIGH (ref 70–99)
Potassium: 4.4 mmol/L (ref 3.5–5.1)
Sodium: 135 mmol/L (ref 135–145)

## 2019-06-25 LAB — TROPONIN I (HIGH SENSITIVITY)
Troponin I (High Sensitivity): 21 ng/L — ABNORMAL HIGH (ref <18)
Troponin I (High Sensitivity): 22 ng/L — ABNORMAL HIGH (ref <18)
Troponin I (High Sensitivity): 22 ng/L — ABNORMAL HIGH (ref <18)
Troponin I (High Sensitivity): 22 ng/L — ABNORMAL HIGH (ref <18)

## 2019-06-25 LAB — HEPATIC FUNCTION PANEL
ALT: 19 U/L (ref 0–44)
AST: 24 U/L (ref 15–41)
Albumin: 2.3 g/dL — ABNORMAL LOW (ref 3.5–5.0)
Alkaline Phosphatase: 74 U/L (ref 38–126)
Bilirubin, Direct: <0.1 mg/dL (ref 0.0–0.2)
Total Bilirubin: 0.7 mg/dL (ref 0.3–1.2)
Total Protein: 5.3 g/dL — ABNORMAL LOW (ref 6.5–8.1)

## 2019-06-25 LAB — ECHOCARDIOGRAM COMPLETE
Height: 68 in
Weight: 3456 oz

## 2019-06-25 LAB — HEPARIN LEVEL (UNFRACTIONATED): Heparin Unfractionated: >3.6 IU/mL — ABNORMAL HIGH (ref 0.30–0.70)

## 2019-06-25 LAB — PROTIME-INR
INR: 1.8 — ABNORMAL HIGH (ref 0.8–1.2)
Prothrombin Time: 20.3 seconds — ABNORMAL HIGH (ref 11.4–15.2)

## 2019-06-25 LAB — SARS CORONAVIRUS 2 (TAT 6-24 HRS): SARS Coronavirus 2: NEGATIVE

## 2019-06-25 MED ORDER — INSULIN ASPART 100 UNIT/ML ~~LOC~~ SOLN: 5.0000 [IU] | Freq: Three times a day (TID) | SUBCUTANEOUS | Status: DC

## 2019-06-25 MED ORDER — CHLORHEXIDINE GLUCONATE CLOTH 2 % EX PADS
6.0000 | MEDICATED_PAD | Freq: Every day | CUTANEOUS | Status: DC
  Administered 2019-06-25 – 2019-07-02 (×8): 6 via TOPICAL

## 2019-06-25 MED ORDER — SODIUM CHLORIDE 0.9 % IV BOLUS
250.0000 mL | Freq: Once | INTRAVENOUS | Status: AC
  Administered 2019-06-25: 250 mL via INTRAVENOUS

## 2019-06-25 MED ORDER — HEPARIN BOLUS VIA INFUSION
4000.0000 [IU] | Freq: Once | INTRAVENOUS | Status: AC
  Administered 2019-06-25: 4000 [IU] via INTRAVENOUS
  Filled 2019-06-25: qty 4000

## 2019-06-25 MED ORDER — INSULIN ASPART 100 UNIT/ML ~~LOC~~ SOLN
0.0000 [IU] | Freq: Three times a day (TID) | SUBCUTANEOUS | Status: DC
  Administered 2019-06-26: 1 [IU] via SUBCUTANEOUS
  Administered 2019-06-26 – 2019-06-27 (×2): 2 [IU] via SUBCUTANEOUS
  Administered 2019-07-01: 1 [IU] via SUBCUTANEOUS
  Filled 2019-06-25 (×5): qty 1

## 2019-06-25 MED ORDER — INSULIN ASPART 100 UNIT/ML ~~LOC~~ SOLN
10.0000 [IU] | Freq: Every day | SUBCUTANEOUS | Status: DC
  Administered 2019-06-26 – 2019-06-29 (×4): 10 [IU] via SUBCUTANEOUS
  Filled 2019-06-25 (×4): qty 1

## 2019-06-25 MED ORDER — GABAPENTIN 100 MG PO CAPS
100.0000 mg | ORAL_CAPSULE | Freq: Two times a day (BID) | ORAL | Status: DC
  Administered 2019-06-25 – 2019-07-02 (×12): 100 mg via ORAL
  Filled 2019-06-25 (×12): qty 1

## 2019-06-25 MED ORDER — INSULIN ASPART 100 UNIT/ML ~~LOC~~ SOLN
5.0000 [IU] | Freq: Every day | SUBCUTANEOUS | Status: DC
  Administered 2019-06-26: 5 [IU] via SUBCUTANEOUS
  Filled 2019-06-25: qty 1

## 2019-06-25 MED ORDER — AMITRIPTYLINE HCL 25 MG PO TABS
25.0000 mg | ORAL_TABLET | Freq: Every day | ORAL | Status: DC
  Administered 2019-06-25 – 2019-07-01 (×7): 25 mg via ORAL
  Filled 2019-06-25 (×7): qty 1

## 2019-06-25 MED ORDER — VITAMIN D 25 MCG (1000 UNIT) PO TABS
4000.0000 [IU] | ORAL_TABLET | Freq: Every day | ORAL | Status: DC
  Administered 2019-06-26 – 2019-07-02 (×6): 4000 [IU] via ORAL
  Filled 2019-06-25 (×6): qty 4

## 2019-06-25 MED ORDER — PANTOPRAZOLE SODIUM 40 MG PO TBEC
40.0000 mg | DELAYED_RELEASE_TABLET | Freq: Every day | ORAL | Status: DC
  Administered 2019-06-25 – 2019-07-01 (×7): 40 mg via ORAL
  Filled 2019-06-25 (×7): qty 1

## 2019-06-25 MED ORDER — OCUVITE-LUTEIN PO CAPS
1.0000 | ORAL_CAPSULE | Freq: Two times a day (BID) | ORAL | Status: DC
  Administered 2019-06-25 – 2019-07-02 (×13): 1 via ORAL
  Filled 2019-06-25 (×15): qty 1

## 2019-06-25 MED ORDER — POLYSACCHARIDE IRON COMPLEX 150 MG PO CAPS
150.0000 mg | ORAL_CAPSULE | Freq: Every day | ORAL | Status: DC
  Administered 2019-06-26 – 2019-07-02 (×5): 150 mg via ORAL
  Filled 2019-06-25 (×7): qty 1

## 2019-06-25 MED ORDER — CLOPIDOGREL BISULFATE 75 MG PO TABS
75.0000 mg | ORAL_TABLET | Freq: Every day | ORAL | Status: DC
  Administered 2019-06-26 – 2019-07-02 (×6): 75 mg via ORAL
  Filled 2019-06-25 (×6): qty 1

## 2019-06-25 MED ORDER — EXTRANEAL 7.5 % IP SOLN
INTRAPERITONEAL | Status: DC
  Filled 2019-06-25 (×8): qty 2500

## 2019-06-25 MED ORDER — VITAMIN C 500 MG PO TABS
500.0000 mg | ORAL_TABLET | Freq: Every day | ORAL | Status: DC
  Administered 2019-06-26 – 2019-07-02 (×6): 500 mg via ORAL
  Filled 2019-06-25 (×6): qty 1

## 2019-06-25 MED ORDER — INSULIN ASPART 100 UNIT/ML ~~LOC~~ SOLN: 0.0000 [IU] | Freq: Every day | SUBCUTANEOUS | Status: DC

## 2019-06-25 MED ORDER — DELFLEX-LC/2.5% DEXTROSE 394 MOSM/L IP SOLN
INTRAPERITONEAL | Status: DC
  Administered 2019-06-28: 13 L via INTRAPERITONEAL
  Filled 2019-06-25 (×3): qty 3000

## 2019-06-25 MED ORDER — SERTRALINE HCL 50 MG PO TABS
100.0000 mg | ORAL_TABLET | Freq: Every day | ORAL | Status: DC
  Administered 2019-06-25 – 2019-07-01 (×7): 100 mg via ORAL
  Filled 2019-06-25 (×7): qty 2

## 2019-06-25 MED ORDER — INSULIN ASPART 100 UNIT/ML ~~LOC~~ SOLN
10.0000 [IU] | Freq: Every day | SUBCUTANEOUS | Status: DC
  Administered 2019-06-25: 10 [IU] via SUBCUTANEOUS
  Filled 2019-06-25: qty 1

## 2019-06-25 MED ORDER — ATORVASTATIN CALCIUM 20 MG PO TABS
40.0000 mg | ORAL_TABLET | Freq: Every day | ORAL | Status: DC
  Administered 2019-06-25 – 2019-07-01 (×7): 40 mg via ORAL
  Filled 2019-06-25 (×7): qty 2

## 2019-06-25 MED ORDER — INSULIN GLARGINE 100 UNIT/ML ~~LOC~~ SOLN
15.0000 [IU] | Freq: Two times a day (BID) | SUBCUTANEOUS | Status: DC
  Administered 2019-06-26 (×2): 15 [IU] via SUBCUTANEOUS
  Filled 2019-06-25 (×5): qty 0.15

## 2019-06-25 MED ORDER — TORSEMIDE 20 MG PO TABS: 40.0000 mg | ORAL_TABLET | Freq: Two times a day (BID) | ORAL | Status: DC

## 2019-06-25 MED ORDER — PERFLUTREN LIPID MICROSPHERE
1.0000 mL | INTRAVENOUS | Status: AC | PRN
  Administered 2019-06-25: 2 mL via INTRAVENOUS
  Filled 2019-06-25: qty 10

## 2019-06-25 MED ORDER — HEPARIN (PORCINE) 25000 UT/250ML-% IV SOLN
1200.0000 [IU]/h | INTRAVENOUS | Status: DC
  Administered 2019-06-25: 1200 [IU]/h via INTRAVENOUS
  Filled 2019-06-25: qty 250

## 2019-06-25 MED ORDER — GENTAMICIN SULFATE 0.1 % EX CREA
1.0000 "application " | TOPICAL_CREAM | Freq: Every day | CUTANEOUS | Status: DC
  Administered 2019-06-26 – 2019-07-02 (×7): 1 via TOPICAL
  Filled 2019-06-25: qty 15

## 2019-06-25 MED ORDER — IOHEXOL 9 MG/ML PO SOLN
500.0000 mL | ORAL | Status: AC
  Administered 2019-06-25 (×2): 500 mL via ORAL

## 2019-06-25 MED ORDER — APIXABAN 5 MG PO TABS: 5.0000 mg | ORAL_TABLET | Freq: Two times a day (BID) | ORAL | Status: DC

## 2019-06-25 NOTE — Progress Notes (Signed)
Notify MD of blood sugar of 84 and 15 units of Lantus. Holding medication for the evening. Will continue to monitor

## 2019-06-25 NOTE — ED Notes (Signed)
MD Jimmye Norman notified of hypotension. 260mL bolus ordered.

## 2019-06-25 NOTE — ED Notes (Signed)
Stuck X 2 by this RN, unsuccessful.

## 2019-06-25 NOTE — ED Notes (Signed)
Per MD Stark Jock, pt should be appropriate for tele level of care with current BP. No additional IVF to be ordered at this time d/t risk for fluid overload since pt is on dialysis. Pt is asymptomatic at this time. Will continue to monitor.

## 2019-06-25 NOTE — Consult Note (Signed)
Cardiology Consultation:   Patient ID: Caroline Hernandez; [id number]; [birth year]   Admit date: 06/25/2019 Date of Consult: 06/25/2019  Primary Care Provider: System, Pcp Not In Primary Cardiologist: [place] Primary Electrophysiologist: Caryl Comes   Patient Profile:   Caroline Hernandez is a 73 y.o. female with a hx of CAD with cardiac arrest status post three-vessel CABG in 2004 with LIMA to LAD, SVG to OM, and SVG to RCA status post subsequent PCI to vein graft to OM in 11/2018, permanent A. fib diagnosed in 2016 on Eliquis (previously on Coumadin) status post DCCV in 09/2015, 04/2016 with Mobitz type I noted on post tracing status post RFCA in 07/2016 with recurrent A. fib status post repeat TEE guided DCCV in 09/2016 with recurrent arrhythmia and has been maintained in A. fib since, HFpEF secondary to ICM status post ICD, pulmonary hypertension CLL, ESRD on PD, anemia of chronic disease, PAD status post bilateral BKA, recurrent wound infections complicated by bacteremia/Fournier's gangrene, DM2, HTN, HLD, GERD, and obesity who is being seen today for the evaluation of chest pain at the request of Dr. Stark Jock.  History of Present Illness:   [name] has known extensive/complicated past medical history as outlined above.  Prior nuclear stress test in 06/2015 showed no significant ischemia with old scar noted in the mid to distal anteroseptal and septal region with EF 48%.  Overall this was a low risk scan.  TEE in 09/2016 showed an EF of 40 to 45%, mild mitral regurgitation, mildly dilated left atrium with mild spontaneous echo contrast in the left atrial appendage, large PFO with left-to-right shunting.  Most recent echo from 09/2018 in the setting of volume overload showed an EF of 35 to 40%, nondiagnostic images for diastolic function, severe hypokinesis of the entire septal, anteroseptal, and anterior LV segments, mildly dilated left atrium, mild mitral regurgitation, mild to moderate tricuspid regurgitation,  moderate sclerosis of the aortic valve, mildly enlarged RV cavity size with normal RV systolic function, PASP 47.6 mmHg.  Patient was ultimately initiated on dialysis shortly after this study.  She was admitted to the hospital in 11/2018 with a non-STEMI with troponin peaking at 8.26 at that time.  She underwent cardiac cath on 11/20/2018 which showed known multivessel CAD with moderate diffuse disease affecting the left main, occluded proximal LAD, occluded proximal region of the LCx CTO of a high OM branch following a proximal stent, presumed occlusion of the native RCA in the proximal to mid region.  With regards to her bypass graft anatomy the LIMA to mid LAD was patent with the LAD beyond the LIMA graft being small and diffusely diseased.  The proximal to mid LAD fills by retrograde flow from the LIMA graft.  Vein graft to RCA/PDA was patent with no significant disease.  Vein graft to OM was noted to be 99% occluded at the anastomosis distal to the edge of previously placed stent.  Patient underwent successful PCI/DES to the vein graft to OM and was managed with triple therapy for 30 days followed by subsequent discontinuation of aspirin and continuation of Plavix with Eliquis.  Patient presented to [facility] on 06/25/2019 with a one-week history of worsening shortness of breath, dry cough, swelling in her bilateral lower extremity stumps, bilateral upper extremities, abdominal distention, continuous oozing diarrhea with associated abdominal pain, and a 2-day history of substernal chest discomfort that radiated to the left side of her neck and left jaw and has been rated an 8 out of 10 at its worst.  She has been compliant with her PD.  There has been some noted orthopnea.  Compliant with Eliquis and Plavix.  Upon the patient's arrival to [facility] they were found to have BP ranging from the 80s to low 505L systolic, HR in the 97Q to 90s bpm in A. fib, temp 94 F, weight 98 kg.  She was placed on supplemental oxygen  at 2 L via nasal cannula.  EKG showed rate controlled A. fib with nonspecific IVCD as detailed below, CXR showed no acute cardiopulmonary findings. Labs showed high-sensitivity troponin of 21 with a delta of 22, WBC 24.2 (known CLL), Hgb 11.2, PLT 151, potassium 4.4, serum creatinine 8.2, albumin 2.3, AST/ALT normal, total bili 0.7, GI panel pending, CT abdomen pelvis pending.  At time of cardiology consult patient continues to note 4 out of 10 substernal chest pain.  BP is in the upper 73A systolic.  She remains on supplemental oxygen at 2 L via nasal cannula.  Pulmonary read on echo shows worsening LV systolic function with an EF approximately 30% after discussion with MD.   Past Medical History:  Diagnosis Date   Amputation of left lower extremity below knee ([place])    Amputation of right lower extremity below knee ([place])    CAD (coronary artery disease)    a. 2004 Cardiac Arrest/CABG x 3 (LIMA->LAD, VG->OM, VG->RCA);  b. 06/2015 lexiscan MV: no significant ischemia, EF 48%, low risk->Med Rx.   Chronic combined systolic and diastolic CHF (congestive heart failure) ([place])    a. 12/2014 Echo: EF 45-50%;  b. 08/2015 Echo: EF 45-50%, ant, antsept HK, mildly dil LA, nl RV, mild-mod TR, sev PAH (34mmHg); b. 08/2017 TEE: EF 40-45%, large PFO w/ L->R shunting.   CKD (chronic kidney disease), stage IV (HCC)    CLL (chronic lymphocytic leukemia) (HCC)    Diabetes mellitus without complication ([place])    Essential hypertension    GERD (gastroesophageal reflux disease)    Hiatal hernia    History of cardiac arrest    a. 2004.   Hyperlipidemia    Ischemic cardiomyopathy    a. s/p MDT ICD (originally had 1937 lead-->gen change and lead revision ~ 2012 @ [facility]); b. 12/2014 Echo: EF 45-50%;  c.08/2015 Echo: EF 45-50%; d. 08/2017 Echo: EF 40-45%.   PAD (peripheral artery disease) ([place])    a. s/p bilat BKA   Persistent atrial fibrillation ([place])    a. Dx 12/2014.  CHA2DS2VASc = 6--> warfarin;  b. 09/2015  s/p DCCV-->on amio; c. s/p DCCV 04/25/16; d. 07/2016 s/p RFCA/PVI in setting of recurrent afib despite amio; e. 05/2018 Recurrent afib.    Past Surgical History:  Procedure Laterality Date   ABDOMINAL HYSTERECTOMY     Amputation lower extremity bilaterally Bilateral    CAPD INSERTION N/A 10/25/2018   Procedure: LAPAROSCOPIC INSERTION CONTINUOUS AMBULATORY PERITONEAL DIALYSIS  (CAPD) CATHETER;  Surgeon: Katha Cabal, MD;  Location: [facility] ORS;  Service: Vascular;  Laterality: N/A;   CARDIAC CATHETERIZATION     CARDIOVERSION N/A 10/09/2016   Procedure: CARDIOVERSION;  Surgeon: Lelon Perla, MD;  Location: [place];  Service: Cardiovascular;  Laterality: N/A;   CORONARY ARTERY BYPASS GRAFT     CORONARY STENT INTERVENTION N/A 11/20/2018   Procedure: CORONARY STENT INTERVENTION;  Surgeon: Isaias Cowman, MD;  Location: [place] CV LAB;  Service: Cardiovascular;  Laterality: N/A;  SVG- OM   DIALYSIS/PERMA CATHETER INSERTION N/A 10/22/2018   Procedure: DIALYSIS/PERMA CATHETER INSERTION;  Surgeon: Katha Cabal, MD;  Location: [place]  CV LAB;  Service: Cardiovascular;  Laterality: N/A;   DIALYSIS/PERMA CATHETER REMOVAL N/A 01/06/2019   Procedure: DIALYSIS/PERMA CATHETER REMOVAL;  Surgeon: Algernon Huxley, MD;  Location: [place] CV LAB;  Service: Cardiovascular;  Laterality: N/A;   ELECTROPHYSIOLOGIC STUDY N/A 10/07/2015   Procedure: CARDIOVERSION;  Surgeon: Minna Merritts, MD;  Location: [facility] ORS;  Service: Cardiovascular;  Laterality: N/A;   ELECTROPHYSIOLOGIC STUDY N/A 04/25/2016   Procedure: Cardioversion;  Surgeon: Minna Merritts, MD;  Location: [facility] ORS;  Service: Cardiovascular;  Laterality: N/A;   ELECTROPHYSIOLOGIC STUDY N/A 07/18/2016   Procedure: Atrial Fibrillation Ablation;  Surgeon: Thompson Grayer, MD;  Location: [place] CV LAB;  Service: Cardiovascular;  Laterality: N/A;   IMPLANTABLE CARDIOVERTER DEFIBRILLATOR IMPLANT  2005   [employer]      LEFT HEART CATH AND CORS/GRAFTS ANGIOGRAPHY N/A 11/20/2018   Procedure: LEFT HEART CATH AND CORS/GRAFTS ANGIOGRAPHY;  Surgeon: Minna Merritts, MD;  Location: [place] CV LAB;  Service: Cardiovascular;  Laterality: N/A;   TEE WITHOUT CARDIOVERSION N/A 07/18/2016   Procedure: TRANSESOPHAGEAL ECHOCARDIOGRAM (TEE);  Surgeon: Sueanne Margarita, MD;  Location: [facility] ENDOSCOPY;  Service: Cardiovascular;  Laterality: N/A;   TEE WITHOUT CARDIOVERSION N/A 10/09/2016   Procedure: TRANSESOPHAGEAL ECHOCARDIOGRAM (TEE);  Surgeon: Lelon Perla, MD;  Location: [facility] ENDOSCOPY;  Service: Cardiovascular;  Laterality: N/A;     Home Meds: Prior to Admission medications   Medication Sig Start Date End Date Taking? Authorizing Provider  amitriptyline (ELAVIL) 25 MG tablet Take 25 mg by mouth at bedtime.    Yes [provider]  amLODipine (NORVASC) 10 MG tablet TAKE 1 TABLET BY MOUTH DAILY 06/02/19  Yes Gollan, Kathlene November, MD  apixaban (ELIQUIS) 5 MG TABS tablet Take 1 tablet (5 mg total) by mouth 2 (two) times daily. 11/01/18  Yes Deboraha Sprang, MD  atorvastatin (LIPITOR) 40 MG tablet Take 40 mg by mouth at bedtime.    Yes [provider]  Cholecalciferol (HM VITAMIN D3) 100 MCG (4000 UT) CAPS Take 4,000 Units by mouth daily.    Yes [provider]  clopidogrel (PLAVIX) 75 MG tablet Take 1 tablet (75 mg) by mouth once daily 12/26/18  Yes Gollan, Kathlene November, MD  gabapentin (NEURONTIN) 100 MG capsule Take 100 mg by mouth 3 (three) times daily.    Yes [provider]  insulin aspart (NOVOLOG) 100 UNIT/ML injection Inject 5-10 Units into the skin 3 (three) times daily before meals. 10 units into the skin before breakfast then 5 units before lunch then 10 units before supper (evening meal)   Yes [provider]  insulin glargine (LANTUS) 100 UNIT/ML injection Inject 0.15 mLs (15 Units total) into the skin 2 (two) times daily. 10/27/18  Yes Demetrios Loll, MD  iron polysaccharides  (NIFEREX) 150 MG capsule Take 150 mg by mouth daily.   Yes [provider]  losartan (COZAAR) 100 MG tablet Take 0.5 tablets (50 mg total) by mouth daily. Hold if BP <120 02/18/19  Yes Fritzi Mandes, MD  Multiple Vitamins-Minerals (VITEYES AREDS FORMULA/LUTEIN) CAPS Take 1 capsule by mouth 2 (two) times daily.   Yes [provider]  nitroGLYCERIN (NITROSTAT) 0.4 MG SL tablet Place 1 tablet (0.4 mg total) under the tongue every 5 (five) minutes x 3 doses as needed for chest pain. 11/23/18  Yes Lule, Joana, PA  pantoprazole (PROTONIX) 40 MG tablet Take 40 mg by mouth at bedtime. 06/19/16  Yes [provider]  sertraline (ZOLOFT) 100 MG tablet Take 100  mg by mouth at bedtime.  06/02/15  Yes [provider]  torsemide (DEMADEX) 20 MG tablet TAKE 2 TABLETS(40 MG) BY MOUTH TWICE DAILY Patient taking differently: Take 40 mg by mouth 2 (two) times daily.  06/18/19  Yes Gollan, Kathlene November, MD  vitamin C (ASCORBIC ACID) 500 MG tablet Take 500 mg by mouth daily.   Yes [provider]  cephALEXin (KEFLEX) 250 MG capsule Take 1 capsule (250 mg total) by mouth daily. Patient not taking: Reported on 06/25/2019 02/18/19   Fritzi Mandes, MD    Inpatient Medications: Scheduled Meds:  amitriptyline  25 mg Oral QHS   apixaban  5 mg Oral BID   atorvastatin  40 mg Oral QHS   Cholecalciferol  4,000 Units Oral Daily   [START ON 06/26/2019] clopidogrel  75 mg Oral Daily   gabapentin  100 mg Oral BID   insulin aspart  0-5 Units Subcutaneous QHS   insulin aspart  0-9 Units Subcutaneous TID WC   [START ON 06/26/2019] insulin aspart  10 Units Subcutaneous QAC breakfast   And   [START ON 06/26/2019] insulin aspart  5 Units Subcutaneous QAC lunch   And   insulin aspart  10 Units Subcutaneous QAC supper   insulin glargine  15 Units Subcutaneous BID   iohexol  500 mL Oral Q1 Hr x 2   iron polysaccharides  150 mg Oral Daily   pantoprazole  40 mg Oral QHS   sertraline   100 mg Oral QHS   torsemide  40 mg Oral BID   vitamin C  500 mg Oral Daily   Viteyes AREDS Formula/Lutein  1 capsule Oral BID   Continuous Infusions:  PRN Meds:   Allergies:   Allergies  Allergen Reactions   Piperacillin Other (See Comments)    Renal failure   Piperacillin-Tazobactam In Dex Other (See Comments)    Possible AIN in 2008??? See ID note**PER PT-CAUSED RENAL FAILURE**   Zosyn [Piperacillin Sod-Tazobactam So] Other (See Comments)    Renal failure    Social History:   Social History   Socioeconomic History   Marital status: Divorced    Spouse name: Not on file   Number of children: Not on file   Years of education: Not on file   Highest education level: Not on file  Occupational History   Not on file  Social Needs   Financial resource strain: Not on file   Food insecurity    Worry: Not on file    Inability: Not on file   Transportation needs    Medical: Not on file    Non-medical: Not on file  Tobacco Use   Smoking status: Never Smoker   Smokeless tobacco: Never Used  Substance and Sexual Activity   Alcohol use: No   Drug use: No   Sexual activity: Not on file  Lifestyle   Physical activity    Days per week: Not on file    Minutes per session: Not on file   Stress: Not on file  Relationships   Social connections    Talks on phone: Not on file    Gets together: Not on file    Attends religious service: Not on file    Active member of club or organization: Not on file    Attends meetings of clubs or organizations: Not on file    Relationship status: Not on file   Intimate partner violence    Fear of current or ex partner: Not on file  Emotionally abused: Not on file    Physically abused: Not on file    Forced sexual activity: Not on file  Other Topics Concern   Not on file  Social History Narrative   Not on file     Family History:   Family History  Problem Relation Age of Onset   CAD Mother    Diabetes  Mother    Atrial fibrillation Mother    Lung cancer Father    Diabetes Father     ROS:  Review of Systems  Constitutional: Positive for malaise/fatigue. Negative for chills, diaphoresis, fever and weight loss.  HENT: Negative for congestion.   Eyes: Negative for discharge and redness.  Respiratory: Positive for cough and shortness of breath. Negative for hemoptysis, sputum production and wheezing.   Cardiovascular: Positive for chest pain and leg swelling. Negative for palpitations, orthopnea, claudication and PND.       Upper extremity swelling  Gastrointestinal: Positive for abdominal pain, diarrhea and nausea. Negative for blood in stool, constipation, heartburn, melena and vomiting.  Genitourinary: Negative for hematuria.  Musculoskeletal: Negative for falls and myalgias.  Skin: Negative for rash.  Neurological: Positive for weakness. Negative for dizziness, tingling, tremors, sensory change, speech change, focal weakness and loss of consciousness.  Endo/Heme/Allergies: Does not bruise/bleed easily.  Psychiatric/Behavioral: Negative for substance abuse. The patient is not nervous/anxious.   All other systems reviewed and are negative.     Physical Exam/Data:   Vitals:   06/25/19 1200 06/25/19 1239 06/25/19 1300 06/25/19 1330  BP: (!) 89/57 (!) 89/39 (!) 92/52 (!) 91/55  Pulse: 74 82 73 75  Resp: 17 19 17 16   Temp:      TempSrc:      SpO2: 97% 100% 98% 97%  Weight:      Height:       No intake or output data in the 24 hours ending 06/25/19 1415 Filed Weights   06/25/19 1037  Weight: 98 kg   Body mass index is 32.84 kg/m.   Physical Exam: General: Well developed, well nourished, in no acute distress. Head: Normocephalic, atraumatic, sclera non-icteric, no xanthomas, nares without discharge.  Neck: Negative for carotid bruits. JVD difficult to assess secondary to body habitus. Lungs: Clear bilaterally to auscultation without wheezes, rales, or rhonchi. Breathing  is unlabored. Heart: Irregular with S1 S2.  1/6 systolic murmur best heard in the apex, no rubs, or gallops appreciated. Abdomen: Soft, mildly tender, mildly distended with normoactive bowel sounds. No hepatomegaly. No rebound/guarding. No obvious abdominal masses. Msk:  Strength and tone appear normal for age. Extremities: Status post bilateral BKA with 1-2+ lower extremity pitting edema.  Puffy edema noted of the bilateral upper extremities.  Mild jaundice-like appearance of the bilateral upper and lower extremities. Neuro: Alert and oriented X 3. No facial asymmetry. No focal deficit. Moves all extremities spontaneously. Psych:  Responds to questions appropriately with a normal affect.   EKG:  The EKG was personally reviewed and demonstrates: A. fib, 90 bpm, nonspecific IVCD, nonspecific lateral ST-T changes consistent with prior tracings Telemetry:  Telemetry was personally reviewed and demonstrates: A. fib, 80s to 90s bpm  Weights: Filed Weights   06/25/19 1037  Weight: 98 kg    Relevant CV Studies: LHC 11/20/2018: Coronary angiography:  Coronary dominance: Right  Left mainstem:   Large vessel that bifurcates into the LAD and left circumflex, moderate diffuse disease  Left anterior descending (LAD): Moderate-sized vessel , occluded proximally  LIMA to the mid LAD is patent .  LAD beyond the LIMA graft is small and diffusely diseased .  Diagonal vessels small and diffusely diseased .  Proximal to mid LAD fills by retrograde flow from LIMA   Left circumflex: Essentially occluded in the proximal region, very small vessel mid to distal.  High OM appears to have proximal stent followed by CTO  Right coronary artery (RCA): Native RCA presumed to be occluded from proximal to mid region Vein graft to the RCA patent, large vessel, 90% disease mid PDA  SVG to the RCA/PDA: Patent, no significant disease Saphenous vein graft to OM,  99% disease at the anastomosis, distal edge of the  stent  Left ventriculography: Aortic valve crossed for pressures, no significant aortic valve stenosis, left ventricular end-diastolic pressure 27  Final Conclusions:   Severe three-vessel native disease with occluded RCA, LAD, left circumflex Patent grafts LIMA to the LAD, vein graft to the OM, vein graft to the RCA Severe disease of anastomosis vein graft to the OM estimated 99%  Recommendations:  Case discussed with Dr. Aris Georgia,  He will attempt intervention on the vein graft to the OM. Very high risk procedure given essentially occluded mid to distal LAD. __________  PCI 11/20/2018:  1st Mrg lesion is 95% stenosed.  A drug-eluting stent was successfully placed using a STENT RESOLUTE ONYX 2.5X12.  Post intervention, there is a 0% residual stenosis.   1.  High-grade anastomotic stenosis SVG to OM1 2.  Successful PCI with DES at the anastomotic site SVG to OM1 __________  2D echo 10/11/2018: 1. The left ventricle has moderately reduced systolic function of 83-38%. The cavity size is mildly increased. There is no left ventricular wall thickness. The left ventricular diastology could not be evaluated due to nondiagnostic images.  2. There is severe hypokinesis of the entire septal, anteroseptal and anterior left ventricular segments.  3. Mildly dilated left atrial size.  4. The mitral valve normal in structure. Regurgitation is mild by color flow Doppler.  5. Normal tricuspid valve.  6. Tricuspid regurgitation mild-moderate.  7. The aortic valve normal. There is moderate sclerosis of the aortic valve.  8. The right ventricle is mildly enlarged in size. There is normal systolic function. Right ventricular systolic pressure is mildly elevated with an estimated pressure of 42.4 mmHg.   Laboratory Data:  Chemistry Recent Labs  Lab 06/25/19 1128  NA 135  K 4.4  CL 91*  CO2 24  GLUCOSE 183*  BUN 57*  CREATININE 8.20*  CALCIUM 7.6*  GFRNONAA 4*  GFRAA 5*  ANIONGAP  20*    No results for input(s): PROT, ALBUMIN, AST, ALT, ALKPHOS, BILITOT in the last 168 hours. Hematology Recent Labs  Lab 06/25/19 1128  WBC 24.2*  RBC 4.24  HGB 11.2*  HCT 35.9*  MCV 84.7  MCH 26.4  MCHC 31.2  RDW 17.5*  PLT 151   Cardiac EnzymesNo results for input(s): TROPONINI in the last 168 hours. No results for input(s): TROPIPOC in the last 168 hours.  BNPNo results for input(s): BNP, PROBNP in the last 168 hours.  DDimer No results for input(s): DDIMER in the last 168 hours.  Radiology/Studies:  Dg Chest Port 1 View  Result Date: 06/25/2019 IMPRESSION: No acute cardiopulmonary findings. Electronically Signed   By: Davina Poke M.D.   On: 06/25/2019 11:44    Assessment and Plan:   1.  CAD involving the native coronary arteries and bypass bypass graft status post CABG with subsequent PCI with unstable angina and mildly elevated troponin: -  Patient's troponin elevation is not consistent with ACS and is likely to be expected in the setting of her underlying known multivessel native coronary disease and end-stage renal disease -However, the patient does continue to note ongoing substernal chest pressure that radiates to the left side of her neck and jaw with echo demonstrating further reduction in LV systolic function with EF approximately 30 to 35% now -Given her relative hypotension we are unable to escalate/add antianginal therapy -It is unlikely noninvasive imaging with [facility] Myoview would be of much benefit -In this setting, we recommend LHC which will need to be deferred until Friday, 10/16 as her Eliquis washes out over the next 48 hours -Discontinue Eliquis, start heparin drip given underlying A. Fib -Continue Plavix -Unable to place on isosorbide or metoprolol secondary to hypotension -Hold losartan and torsemide given hypotension -We will discussed risks and benefits with patient and daughter regarding catheterization on rounds on 10/15  2.  Acute on  chronic HFrEF secondary to ICM: -Volume management per dialysis -Recommend nephrology consult for HD while admitted -Cannot exclude some degree of third spacing in the setting of her hypoalbuminemia with an albumin of 2.3 and underlying anemia though hemoglobin is improved when compared to prior -Hold losartan given hypotension -Not currently on beta-blockade secondary to hypotension -Not on Entresto or spironolactone given hypotension and underlying CKD -Stop torsemide given hypotension  3.  Permanent A. fib: -Ventricular rate is reasonably well controlled -Not requiring AV nodal blocking agents at this time -Discontinue Eliquis in preparation for planned diagnostic cath as above -Start heparin drip  4.  Abdominal pain/diarrhea: -GI panel pending -Planning for CT abdomen per IM  5.  ESRD on PD: -Nephrology on board  6.  PAD: -Discussed bilateral BKA -Unable to wear prosthesis at this time secondary to swelling of the stump sites -Volume management as above  7.  Hypotension: -BP remains low -Consider evaluation for potential spontaneous bacterial peritonitis given her abdominal pain and soft BP   For questions or updates, please contact [place] Please consult [email or web address] for contact info under Cardiology/STEMI.   Signed, Christell Faith, PA-C [facility] Pager: [phone] 06/25/2019, 2:15 PM

## 2019-06-25 NOTE — Progress Notes (Signed)
*  PRELIMINARY RESULTS* Echocardiogram 2D Echocardiogram has been performed.  Caroline Hernandez 06/25/2019, 3:07 PM

## 2019-06-25 NOTE — Progress Notes (Signed)
Notify Dr. Jackelyn Poling and ask if patient will have her peritoneal dialysis tonight, per MD she will have it tonight. Will ask patient's consent. Will continue to monitor.

## 2019-06-25 NOTE — ED Notes (Addendum)
Report to Alicia, RN.

## 2019-06-25 NOTE — ED Notes (Signed)
Pt turned and cleansed of small amount of formed stool. Sample collected. Excoriation noted to inside of bil thighs. Barrier cream applied.

## 2019-06-25 NOTE — ED Notes (Signed)
Patient transported to CT 

## 2019-06-25 NOTE — ED Provider Notes (Signed)
[facility] Emergency Department Provider Note       Time seen: ----------------------------------------- 10:31 AM on 06/25/2019 -----------------------------------------   I have reviewed the triage vital signs and the nursing notes.  HISTORY   Chief Complaint No chief complaint on file.   HPI Caroline Hernandez is a 73 y.o. female with a history of bilateral below the knee amputations, coronary artery disease, CHF, CKD, CLL, diabetes, GERD, hyperlipidemia, A. fib who presents to the ED for chest pain.  Patient describes chest pain that goes into her neck.  She also has some shortness of breath and describes leg swelling.  She states dialysis has been going normally at home.  Past Medical History:  Diagnosis Date  . Amputation of left lower extremity below knee ([place])   . Amputation of right lower extremity below knee ([place])   . CAD (coronary artery disease)    a. 2004 Cardiac Arrest/CABG x 3 (LIMA->LAD, VG->OM, VG->RCA);  b. 06/2015 lexiscan MV: no significant ischemia, EF 48%, low risk->Med Rx.  . Chronic combined systolic and diastolic CHF (congestive heart failure) ([place])    a. 12/2014 Echo: EF 45-50%;  b. 08/2015 Echo: EF 45-50%, ant, antsept HK, mildly dil LA, nl RV, mild-mod TR, sev PAH (70mmHg); b. 08/2017 TEE: EF 40-45%, large PFO w/ L->R shunting.  . CKD (chronic kidney disease), stage IV ([place])   . CLL (chronic lymphocytic leukemia) ([place])   . Diabetes mellitus without complication ([place])   . Essential hypertension   . GERD (gastroesophageal reflux disease)   . Hiatal hernia   . History of cardiac arrest    a. 2004.  Marland Kitchen Hyperlipidemia   . Ischemic cardiomyopathy    a. s/p MDT ICD (originally had 8242 lead-->gen change and lead revision ~ 2012 @ [place]); b. 12/2014 Echo: EF 45-50%;  c.08/2015 Echo: EF 45-50%; d. 08/2017 Echo: EF 40-45%.  Marland Kitchen PAD (peripheral artery disease) (HCC)    a. s/p bilat BKA  . Persistent atrial fibrillation    a. Dx 12/2014.   CHA2DS2VASc = 6--> warfarin;  b. 09/2015 s/p DCCV-->on amio; c. s/p DCCV 04/25/16; d. 07/2016 s/p RFCA/PVI in setting of recurrent afib despite amio; e. 05/2018 Recurrent afib.    Patient Active Problem List   Diagnosis Date Noted  . NSTEMI (non-ST elevated myocardial infarction) ([place]) 11/18/2018  . CLL (chronic lymphocytic leukemia) ([place]) 10/23/2018  . Pressure injury of skin 10/19/2018  . CHF (congestive heart failure) ([place]) 10/16/2018  . CAP (community acquired pneumonia) 04/14/2018  . UTI (urinary tract infection) 03/07/2018  . Atrial fibrillation with slow ventricular response ([place]) 07/18/2016  . Chronic diastolic CHF (congestive heart failure) ([place])   . SOB (shortness of breath)   . Acute on chronic systolic heart failure ([place]) 08/20/2015  . CKD (chronic kidney disease), stage IV ([place])   . Ischemic cardiomyopathy   . Chronic combined systolic and diastolic CHF (congestive heart failure) ([place])   . Essential hypertension 06/18/2015  . Systolic and diastolic CHF, acute on chronic ([place])   . Acute on chronic renal failure ([place])   . Angina pectoris ([place])   . Coronary artery disease involving native coronary artery of native heart with angina pectoris with documented spasm ([place])   . 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 ([place])   . Persistent atrial fibrillation ([place])   . Bradycardia   . Chest pain 06/12/2015    Past Surgical History:  Procedure Laterality Date  . ABDOMINAL HYSTERECTOMY    . Amputation lower  extremity bilaterally Bilateral   . CAPD INSERTION N/A 10/25/2018   Procedure: LAPAROSCOPIC INSERTION CONTINUOUS AMBULATORY PERITONEAL DIALYSIS  (CAPD) CATHETER;  Surgeon: Katha Cabal, MD;  Location: [facility] ORS;  Service: Vascular;  Laterality: N/A;  . CARDIAC CATHETERIZATION    . CARDIOVERSION N/A 10/09/2016   Procedure: CARDIOVERSION;  Surgeon: Lelon Perla, MD;  Location: [facility] ENDOSCOPY;  Service: Cardiovascular;  Laterality: N/A;  . CORONARY ARTERY  BYPASS GRAFT    . CORONARY STENT INTERVENTION N/A 11/20/2018   Procedure: CORONARY STENT INTERVENTION;  Surgeon: Isaias Cowman, MD;  Location: [place] CV LAB;  Service: Cardiovascular;  Laterality: N/A;  SVG- OM  . DIALYSIS/PERMA CATHETER INSERTION N/A 10/22/2018   Procedure: DIALYSIS/PERMA CATHETER INSERTION;  Surgeon: Katha Cabal, MD;  Location: [place] CV LAB;  Service: Cardiovascular;  Laterality: N/A;  . DIALYSIS/PERMA CATHETER REMOVAL N/A 01/06/2019   Procedure: DIALYSIS/PERMA CATHETER REMOVAL;  Surgeon: Algernon Huxley, MD;  Location: [place] CV LAB;  Service: Cardiovascular;  Laterality: N/A;  . ELECTROPHYSIOLOGIC STUDY N/A 10/07/2015   Procedure: CARDIOVERSION;  Surgeon: Minna Merritts, MD;  Location: [facility] ORS;  Service: Cardiovascular;  Laterality: N/A;  . ELECTROPHYSIOLOGIC STUDY N/A 04/25/2016   Procedure: Cardioversion;  Surgeon: Minna Merritts, MD;  Location: [facility] ORS;  Service: Cardiovascular;  Laterality: N/A;  . ELECTROPHYSIOLOGIC STUDY N/A 07/18/2016   Procedure: Atrial Fibrillation Ablation;  Surgeon: Thompson Grayer, MD;  Location: [place] CV LAB;  Service: Cardiovascular;  Laterality: N/A;  . IMPLANTABLE CARDIOVERTER DEFIBRILLATOR IMPLANT  2005   [employer]   . LEFT HEART CATH AND CORS/GRAFTS ANGIOGRAPHY N/A 11/20/2018   Procedure: LEFT HEART CATH AND CORS/GRAFTS ANGIOGRAPHY;  Surgeon: Minna Merritts, MD;  Location: [place] CV LAB;  Service: Cardiovascular;  Laterality: N/A;  . TEE WITHOUT CARDIOVERSION N/A 07/18/2016   Procedure: TRANSESOPHAGEAL ECHOCARDIOGRAM (TEE);  Surgeon: Sueanne Margarita, MD;  Location: [facility] ENDOSCOPY;  Service: Cardiovascular;  Laterality: N/A;  . TEE WITHOUT CARDIOVERSION N/A 10/09/2016   Procedure: TRANSESOPHAGEAL ECHOCARDIOGRAM (TEE);  Surgeon: Lelon Perla, MD;  Location: [facility] ENDOSCOPY;  Service: Cardiovascular;  Laterality: N/A;    Allergies Piperacillin, Piperacillin-tazobactam in dex, and Zosyn [piperacillin  sod-tazobactam so]  Social History Social History   Tobacco Use  . Smoking status: Never Smoker  . Smokeless tobacco: Never Used  Substance Use Topics  . Alcohol use: No  . Drug use: No   Review of Systems Constitutional: Negative for fever. Cardiovascular: Positive for chest pain Respiratory: Negative for shortness of breath. Gastrointestinal: Negative for abdominal pain, vomiting and diarrhea. Musculoskeletal: Negative for back pain.  Positive for edema Skin: Negative for rash. Neurological: Negative for headaches, focal weakness or numbness.  All systems negative/normal/unremarkable except as stated in the HPI  ____________________________________________   PHYSICAL EXAM:  VITAL SIGNS: ED Triage Vitals  Enc Vitals Group     BP      Pulse      Resp      Temp      Temp src      SpO2      Weight      Height      Head Circumference      Peak Flow      Pain Score      Pain Loc      Pain Edu?      Excl. in [place]?    Constitutional: Alert and oriented.  Chronically ill-appearing, no distress.  Disheveled appearance Eyes: Conjunctivae are normal. Normal extraocular movements. ENT  Head: Normocephalic and atraumatic.      Nose: No congestion/rhinnorhea.      Mouth/Throat: Mucous membranes are moist.      Neck: No stridor. Cardiovascular: Normal rate, regular rhythm. No murmurs, rubs, or gallops. Respiratory: Normal respiratory effort without tachypnea nor retractions. Breath sounds are clear and equal bilaterally. No wheezes/rales/rhonchi. Gastrointestinal: Soft and nontender. Normal bowel sounds Musculoskeletal: Bilateral below the knee amputations, some pitting edema is noted Neurologic:  Normal speech and language. No gross focal neurologic deficits are appreciated.  Skin:  Skin is warm, dry and intact. No rash noted. Psychiatric: Mood and affect are normal. Speech and behavior are normal.  ____________________________________________  EKG: Interpreted by  me.  Atrial fibrillation with a rate of 90 bpm, abnormal T waves, normal axis  ____________________________________________  ED COURSE:  As part of my medical decision making, I reviewed the following data within the [place] History obtained from family if available, nursing notes, old chart and ekg, as well as notes from prior ED visits. Patient presented for chest pain, we will assess with labs and imaging as indicated at this time.   Procedures  Cheyan Frees was evaluated in Emergency Department on 06/25/2019 for the symptoms described in the history of present illness. She was evaluated in the context of the global COVID-19 pandemic, which necessitated consideration that the patient might be at risk for infection with the SARS-CoV-2 virus that causes COVID-19. Institutional protocols and algorithms that pertain to the evaluation of patients at risk for COVID-19 are in a state of rapid change based on information released by regulatory bodies including the CDC and federal and state organizations. These policies and algorithms were followed during the patient's care in the ED.  ____________________________________________   LABS (pertinent positives/negatives)  Labs Reviewed  BASIC METABOLIC PANEL - Abnormal; Notable for the following components:      Result Value   Chloride 91 (*)    Glucose, Bld 183 (*)    BUN 57 (*)    Creatinine, Ser 8.20 (*)    Calcium 7.6 (*)    GFR calc non Af Amer 4 (*)    GFR calc Af Amer 5 (*)    Anion gap 20 (*)    All other components within normal limits  CBC - Abnormal; Notable for the following components:   WBC 24.2 (*)    Hemoglobin 11.2 (*)    HCT 35.9 (*)    RDW 17.5 (*)    All other components within normal limits  TROPONIN I (HIGH SENSITIVITY) - Abnormal; Notable for the following components:   Troponin I (High Sensitivity) 21 (*)    All other components within normal limits  SARS CORONAVIRUS 2 (TAT 6-24 HRS)  TROPONIN  I (HIGH SENSITIVITY)    RADIOLOGY Images were viewed by me  Chest x-ray IMPRESSION:  No acute cardiopulmonary findings.  ____________________________________________   DIFFERENTIAL DIAGNOSIS   Unstable angina, MI, peripheral vascular disease, end-stage renal disease, electrolyte abnormality  FINAL ASSESSMENT AND PLAN  Chest pain, end-stage renal disease on dialysis   Plan: The patient had presented for chest pain. Patient's labs do reveal end-stage renal disease on dialysis.  Mit[place]le count seems to be chronically elevated.  Troponin is 21 which is not unexpected. Patient's imaging not reveal any acute process.  Patient does have multiple current issues including chest pain resembling unstable angina, end-stage renal disease on dialysis and her kidney function continues to look worse.  She also was poorly cared for on  arrival and an adult protective services referral was made.  She will need to see social work while she is in the hospital.   Laurence Aly, MD    Note: This note was generated in part or whole with voice recognition software. Voice recognition is usually quite accurate but there are transcription errors that can and very often do occur. I apologize for any typographical errors that were not detected and corrected.     Earleen Newport, MD 06/25/19 1220

## 2019-06-25 NOTE — Consult Note (Signed)
ANTICOAGULATION CONSULT NOTE - Initial Consult  Pharmacy Consult for Heparin Indication: atrial fibrillation  Allergies  Allergen Reactions  . Piperacillin Other (See Comments)    Renal failure  . 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 See ID note**PER PT-CAUSED RENAL FAILURE**  . Zosyn [Piperacillin Sod-Tazobactam So] Other (See Comments)    Renal failure    Patient Measurements: Height: 5\' 8"  (172.7 cm) Weight: 216 lb (98 kg) IBW/kg (Calculated) : 63.9 Heparin Dosing Weight: 85.3  Vital Signs: Temp: 98.4 F (36.9 C) (10/14 1035) Temp Source: Oral (10/14 1035) BP: 91/55 (10/14 1330) Pulse Rate: 75 (10/14 1330)  Labs: Recent Labs    06/25/19 1128 06/25/19 1231  HGB 11.2*  --   HCT 35.9*  --   PLT 151  --   CREATININE 8.20*  --   TROPONINIHS 21* 22*    Estimated Creatinine Clearance: 7.7 mL/min (A) (by C-G formula based on SCr of 8.2 mg/dL (H)).   Medical History: Past Medical History:  Diagnosis Date  . Amputation of left lower extremity below knee ([place])   . Amputation of right lower extremity below knee ([place])   . CAD (coronary artery disease)    a. 2004 Cardiac Arrest/CABG x 3 (LIMA->LAD, VG->OM, VG->RCA);  b. 06/2015 lexiscan MV: no significant ischemia, EF 48%, low risk->Med Rx.  . Chronic combined systolic and diastolic CHF (congestive heart failure) ([place])    a. 12/2014 Echo: EF 45-50%;  b. 08/2015 Echo: EF 45-50%, ant, antsept HK, mildly dil LA, nl RV, mild-mod TR, sev PAH (58mmHg); b. 08/2017 TEE: EF 40-45%, large PFO w/ L->R shunting.  . CKD (chronic kidney disease), stage IV ([place])   . CLL (chronic lymphocytic leukemia) ([place])   . Diabetes mellitus without complication ([place])   . Essential hypertension   . GERD (gastroesophageal reflux disease)   . Hiatal hernia   . History of cardiac arrest    a. 2004.  Marland Kitchen Hyperlipidemia   . Ischemic cardiomyopathy    a. s/p MDT ICD (originally had 2094 lead-->gen change and  lead revision ~ 2012 @ [place]); b. 12/2014 Echo: EF 45-50%;  c.08/2015 Echo: EF 45-50%; d. 08/2017 Echo: EF 40-45%.  Marland Kitchen PAD (peripheral artery disease) (HCC)    a. s/p bilat BKA  . Persistent atrial fibrillation ([place])    a. Dx 12/2014.  CHA2DS2VASc = 6--> warfarin;  b. 09/2015 s/p DCCV-->on amio; c. s/p DCCV 04/25/16; d. 07/2016 s/p RFCA/PVI in setting of recurrent afib despite amio; e. 05/2018 Recurrent afib.    Medications:  Apixaban prior to admission (last dose per patient on 10/14 @ 0830)  Assessment: 73 y/o F with a pmh significant for Afib on apixaban PTA, ESRD on PD, CHF who presented to the ED with chest pain, diarrhea, and abdominal discomfort. Pharmacy consulted for heparin infusion.   Goal of Therapy:  aPTT 66-102 seconds Monitor platelets by anticoagulation protocol: Yes   Plan:  -Last dose of apixaban at ~0830 this morning per medication reconciliation. Will start heparin at 2000.   -4000 units heparin bolus x1 followed by maintenance rate of 1200 units/hr  -aPTT at 0400. Will follow aPTT every 8 hours and defer monitoring of heparin levels considering recent apixaban use. -Daily heparin level and CBC per protocol.  [place] Resident 06/25/2019,2:35 PM

## 2019-06-25 NOTE — ED Notes (Signed)
DSS called by EMS staff, Theadora Rama

## 2019-06-25 NOTE — ED Triage Notes (Addendum)
Arrives to ER via [facility] from home c/o chest pain X 2 days. Pt was found in recliner in her home which she states she has been in for approx 3 days, defacating on herself. Left hand and extremity swelling, no injury. Pt bilateral amputee, daughter helps her and has been unable to due to "depression" per patient. States her daughter is doing best she can. Wears oxygen as needed at home but was not wearing upon EMS arrival. States she has not taken her insulin over past few days. Pt does peritoneal dialysis at home, last done last night. Pt covered in stool upon arrival-cleaned by RN. VSS with EMS.

## 2019-06-25 NOTE — ED Notes (Signed)
Pt BP 91/55 (65) after 2nd bolus. Reported to Dr. Stark Jock via secure chat.

## 2019-06-25 NOTE — Progress Notes (Signed)
Care Alignment Note  Advanced Directives Documents (Living Will, Power of Attorney) currently in the EHR no advanced directives documents available .  Has the patient discussed their wishes with their family/healthcare power of attorney yes. How much does the family or healthcare power of attorney know about their wishes. Patient is around bedside understands patient's multiple comorbidities including end-stage renal disease, chronic atrial fibrillation and diabetes mellitus now presenting with chest pain  What does the patient/decision maker understand about their medical condition and the natural course of their disease.  Chest pain.  Abdominal pain and diarrhea.  Chronic atrial fibrillation.  End-stage renal disease on peritoneal dialysis.  CAD status post bypass surgery.  Patient status post bilateral BKA secondary to PAD.  Patient clearly wishes to be full code.  Daughter agrees with her decision.  What is the patient/decision maker's biggest fear or concern for the future pain and suffering   What is the most important goal for this patient should their health condition worsen maintenance of function.  Current   Code Status: Full Code  Current code status has been reviewed/updated.  Time spent:23minutes

## 2019-06-25 NOTE — ED Notes (Addendum)
BP 89/39 after 265mL bolus. Attending MD Ojie notified. Order received to repeat bolus. Pt in trendelenburg position.

## 2019-06-25 NOTE — H&P (Signed)
[facility] NAME: Caroline Hernandez    MR#:  [id number]  DATE OF BIRTH:  [birth year]  DATE OF ADMISSION:  06/25/2019  PRIMARY CARE PHYSICIAN: System, Pcp Not In   REQUESTING/REFERRING PHYSICIAN: Lenise Arena  CHIEF COMPLAINT:   Chief Complaint  Patient presents with  . Chest Pain    HISTORY OF PRESENT ILLNESS:  Caroline Hernandez  is a 73 y.o. female with a known history of end-stage renal disease on peritoneal dialysis, chronic combined systolic and diastolic CHF, peripheral artery disease status post bilateral BKA, diabetes mellitus, CLL on chronic atrial fibrillation on anticoagulation with Eliquis who presented to the emergency room with complaints of chest pain.  Did have some mild associated shortness of breath.  Reported having diarrhea with some abdominal discomfort over the last several days.  Patient denies any fevers.  No nausea or vomiting.  Reported noticing increasing generalized swelling.  Patient was evaluated in the emergency room and twelve-lead EKG revealed atrial fibrillation.  Troponin minimally elevated at 21.  Currently chest pain-free.  Chest x-ray negative.  Patient noted to be hypotensive with systolic blood pressure in the 80s.  Given IV fluid bolus with normal saline; total of 500 cc in the emergency room.  Currently no shortness of breath.  Medical service called to admit patient for further evaluation and management.  PAST MEDICAL HISTORY:   Past Medical History:  Diagnosis Date  . Amputation of left lower extremity below knee ([place])   . Amputation of right lower extremity below knee ([place])   . CAD (coronary artery disease)    a. 2004 Cardiac Arrest/CABG x 3 (LIMA->LAD, VG->OM, VG->RCA);  b. 06/2015 lexiscan MV: no significant ischemia, EF 48%, low risk->Med Rx.  . Chronic combined systolic and diastolic CHF (congestive heart failure) ([place])    a. 12/2014 Echo: EF 45-50%;  b. 08/2015 Echo: EF 45-50%, ant,  antsept HK, mildly dil LA, nl RV, mild-mod TR, sev PAH (23mmHg); b. 08/2017 TEE: EF 40-45%, large PFO w/ L->R shunting.  . CKD (chronic kidney disease), stage IV ([place])   . CLL (chronic lymphocytic leukemia) ([place])   . Diabetes mellitus without complication ([place])   . Essential hypertension   . GERD (gastroesophageal reflux disease)   . Hiatal hernia   . History of cardiac arrest    a. 2004.  Marland Kitchen Hyperlipidemia   . Ischemic cardiomyopathy    a. s/p MDT ICD (originally had 9509 lead-->gen change and lead revision ~ 2012 @ [place]); b. 12/2014 Echo: EF 45-50%;  c.08/2015 Echo: EF 45-50%; d. 08/2017 Echo: EF 40-45%.  Marland Kitchen PAD (peripheral artery disease) ([facility])    a. s/p bilat BKA  . Persistent atrial fibrillation ([place])    a. Dx 12/2014.  CHA2DS2VASc = 6--> warfarin;  b. 09/2015 s/p DCCV-->on amio; c. s/p DCCV 04/25/16; d. 07/2016 s/p RFCA/PVI in setting of recurrent afib despite amio; e. 05/2018 Recurrent afib.    PAST SURGICAL HISTORY:   Past Surgical History:  Procedure Laterality Date  . ABDOMINAL HYSTERECTOMY    . Amputation lower extremity bilaterally Bilateral   . CAPD INSERTION N/A 10/25/2018   Procedure: LAPAROSCOPIC INSERTION CONTINUOUS AMBULATORY PERITONEAL DIALYSIS  (CAPD) CATHETER;  Surgeon: Katha Cabal, MD;  Location: [facility] ORS;  Service: Vascular;  Laterality: N/A;  . CARDIAC CATHETERIZATION    . CARDIOVERSION N/A 10/09/2016   Procedure: CARDIOVERSION;  Surgeon: Lelon Perla, MD;  Location: [facility] ENDOSCOPY;  Service: Cardiovascular;  Laterality: N/A;  .  CORONARY ARTERY BYPASS GRAFT    . CORONARY STENT INTERVENTION N/A 11/20/2018   Procedure: CORONARY STENT INTERVENTION;  Surgeon: Isaias Cowman, MD;  Location: [place] [facility] LAB;  Service: Cardiovascular;  Laterality: N/A;  SVG- OM  . DIALYSIS/PERMA CATHETER INSERTION N/A 10/22/2018   Procedure: DIALYSIS/PERMA CATHETER INSERTION;  Surgeon: Katha Cabal, MD;  Location: [place] [facility] LAB;  Service: Cardiovascular;   Laterality: N/A;  . DIALYSIS/PERMA CATHETER REMOVAL N/A 01/06/2019   Procedure: DIALYSIS/PERMA CATHETER REMOVAL;  Surgeon: Algernon Huxley, MD;  Location: [place] [facility];  Service: Cardiovascular;  Laterality: N/A;  . ELECTROPHYSIOLOGIC STUDY N/A 10/07/2015   Procedure: CARDIOVERSION;  Surgeon: Minna Merritts, MD;  Location: [facility] ORS;  Service: Cardiovascular;  Laterality: N/A;  . ELECTROPHYSIOLOGIC STUDY N/A 04/25/2016   Procedure: Cardioversion;  Surgeon: Minna Merritts, MD;  Location: [facility] ORS;  Service: Cardiovascular;  Laterality: N/A;  . ELECTROPHYSIOLOGIC STUDY N/A 07/18/2016   Procedure: Atrial Fibrillation Ablation;  Surgeon: Thompson Grayer, MD;  Location: [place] [facility] LAB;  Service: Cardiovascular;  Laterality: N/A;  . IMPLANTABLE CARDIOVERTER DEFIBRILLATOR IMPLANT  2005   [employer]   . LEFT HEART CATH AND CORS/GRAFTS ANGIOGRAPHY N/A 11/20/2018   Procedure: LEFT HEART CATH AND CORS/GRAFTS ANGIOGRAPHY;  Surgeon: Minna Merritts, MD;  Location: [place] [facility] LAB;  Service: Cardiovascular;  Laterality: N/A;  . TEE WITHOUT CARDIOVERSION N/A 07/18/2016   Procedure: TRANSESOPHAGEAL ECHOCARDIOGRAM (TEE);  Surgeon: Sueanne Margarita, MD;  Location: [facility] ENDOSCOPY;  Service: Cardiovascular;  Laterality: N/A;  . TEE WITHOUT CARDIOVERSION N/A 10/09/2016   Procedure: TRANSESOPHAGEAL ECHOCARDIOGRAM (TEE);  Surgeon: Lelon Perla, MD;  Location: [facility] ENDOSCOPY;  Service: Cardiovascular;  Laterality: N/A;    SOCIAL HISTORY:   Social History   Tobacco Use  . Smoking status: Never Smoker  . Smokeless tobacco: Never Used  Substance Use Topics  . Alcohol use: No    FAMILY HISTORY:   Family History  Problem Relation Age of Onset  . CAD Mother   . Diabetes Mother   . Atrial fibrillation Mother   . Lung cancer Father   . Diabetes Father     DRUG ALLERGIES:   Allergies  Allergen Reactions  . Piperacillin Other (See Comments)    Renal failure  . Piperacillin-Tazobactam In Dex Other  (See Comments)    Possible AIN in 2008??? See ID note**PER PT-CAUSED RENAL FAILURE**  . Zosyn [Piperacillin Sod-Tazobactam So] Other (See Comments)    Renal failure    REVIEW OF SYSTEMS:   Review of Systems  Constitutional: Negative for chills and fever.  HENT: Negative for hearing loss and tinnitus.   Eyes: Negative for blurred vision and double vision.  Respiratory: Positive for shortness of breath. Negative for cough and sputum production.   Cardiovascular: Positive for chest pain. Negative for palpitations.  Gastrointestinal: Positive for abdominal pain and diarrhea. Negative for heartburn and nausea.  Genitourinary: Negative for dysuria and urgency.  Musculoskeletal: Negative for myalgias and neck pain.  Skin: Negative for itching and rash.  Neurological: Negative for dizziness and headaches.  Psychiatric/Behavioral: Negative for depression and hallucinations.    MEDICATIONS AT HOME:   Prior to Admission medications   Medication Sig Start Date End Date Taking? Authorizing Provider  amitriptyline (ELAVIL) 25 MG tablet Take 25 mg by mouth at bedtime.    Yes [provider]  amLODipine (NORVASC) 10 MG tablet TAKE 1 TABLET BY MOUTH DAILY 06/02/19  Yes Gollan, Kathlene November, MD  apixaban (ELIQUIS) 5 MG TABS  tablet Take 1 tablet (5 mg total) by mouth 2 (two) times daily. 11/01/18  Yes Deboraha Sprang, MD  atorvastatin (LIPITOR) 40 MG tablet Take 40 mg by mouth at bedtime.    Yes [provider]  Cholecalciferol (HM VITAMIN D3) 100 MCG (4000 UT) CAPS Take 4,000 Units by mouth daily.    Yes [provider]  clopidogrel (PLAVIX) 75 MG tablet Take 1 tablet (75 mg) by mouth once daily 12/26/18  Yes Gollan, Kathlene November, MD  gabapentin (NEURONTIN) 100 MG capsule Take 100 mg by mouth 3 (three) times daily.    Yes [provider]  insulin aspart (NOVOLOG) 100 UNIT/ML injection Inject 5-10 Units into the skin 3 (three) times daily before meals. 10 units into the skin  before breakfast then 5 units before lunch then 10 units before supper (evening meal)   Yes [provider]  insulin glargine (LANTUS) 100 UNIT/ML injection Inject 0.15 mLs (15 Units total) into the skin 2 (two) times daily. 10/27/18  Yes Demetrios Loll, MD  iron polysaccharides (NIFEREX) 150 MG capsule Take 150 mg by mouth daily.   Yes [provider]  losartan (COZAAR) 100 MG tablet Take 0.5 tablets (50 mg total) by mouth daily. Hold if BP <120 02/18/19  Yes Fritzi Mandes, MD  Multiple Vitamins-Minerals (VITEYES AREDS FORMULA/LUTEIN) CAPS Take 1 capsule by mouth 2 (two) times daily.   Yes [provider]  nitroGLYCERIN (NITROSTAT) 0.4 MG SL tablet Place 1 tablet (0.4 mg total) under the tongue every 5 (five) minutes x 3 doses as needed for chest pain. 11/23/18  Yes Lule, Joana, PA  pantoprazole (PROTONIX) 40 MG tablet Take 40 mg by mouth at bedtime. 06/19/16  Yes [provider]  sertraline (ZOLOFT) 100 MG tablet Take 100 mg by mouth at bedtime.  06/02/15  Yes [provider]  torsemide (DEMADEX) 20 MG tablet TAKE 2 TABLETS(40 MG) BY MOUTH TWICE DAILY Patient taking differently: Take 40 mg by mouth 2 (two) times daily.  06/18/19  Yes Gollan, Kathlene November, MD  vitamin C (ASCORBIC ACID) 500 MG tablet Take 500 mg by mouth daily.   Yes [provider]  cephALEXin (KEFLEX) 250 MG capsule Take 1 capsule (250 mg total) by mouth daily. Patient not taking: Reported on 06/25/2019 02/18/19   Fritzi Mandes, MD      VITAL SIGNS:  Blood pressure (!) 89/39, pulse 82, temperature 98.4 F (36.9 C), temperature source Oral, resp. rate 19, height 5\' 8"  (1.727 m), weight 98 kg, SpO2 100 %.  PHYSICAL EXAMINATION:  Physical Exam  GENERAL:  73 y.o.-year-old patient lying in the bed with no acute distress.  EYES: Pupils equal, round, reactive to light and accommodation. No scleral icterus. Extraocular muscles intact.  HEENT: Head atraumatic, normocephalic. Oropharynx and  nasopharynx clear.  NECK:  Supple, no jugular venous distention. No thyroid enlargement, no tenderness.  LUNGS: Normal breath sounds bilaterally, no wheezing, rales,rhonchi or crepitation. No use of accessory muscles of respiration.  CARDIOVASCULAR: S1, S2 normal. No murmurs, rubs, or gallops.  ABDOMEN: Soft, nontender, nondistended. Bowel sounds present. No organomegaly or mass.  EXTREMITIES: Patient status post previous bilateral below-knee amputations.  Patient has some edema around the stump of BKA NEUROLOGIC: Generalized weakness.  Gait not checked.  PSYCHIATRIC: The patient is alert and oriented x 3.  SKIN: No obvious rash, lesion, or ulcer.   LABORATORY PANEL:   CBC Recent Labs  Lab 06/25/19 1128  WBC 24.2*  HGB 11.2*  HCT 35.9*  PLT 151   ------------------------------------------------------------------------------------------------------------------  Chemistries  Recent Labs  Lab 06/25/19 1128  NA 135  K 4.4  CL 91*  CO2 24  GLUCOSE 183*  BUN 57*  CREATININE 8.20*  CALCIUM 7.6*   ------------------------------------------------------------------------------------------------------------------  Cardiac Enzymes No results for input(s): TROPONINI in the last 168 hours. ------------------------------------------------------------------------------------------------------------------  RADIOLOGY:  Dg Chest Port 1 View  Result Date: 06/25/2019 CLINICAL DATA:  Chest pain EXAM: PORTABLE CHEST 1 VIEW COMPARISON:  02/15/2019 FINDINGS: Left-sided implanted cardiac device. Median sternotomy. Stable cardiomegaly. Calcific aortic knob. No focal airspace consolidation. No pleural effusion or pneumothorax. IMPRESSION: No acute cardiopulmonary findings. Electronically Signed   By: Davina Poke M.D.   On: 06/25/2019 11:44      IMPRESSION AND PLAN:  Patient is a 73 year old female with history of end-stage renal disease on peritoneal dialysis, chronic combined systolic  and diastolic CHF, peripheral artery disease status post bilateral BKA, diabetes mellitus, CLL on chronic atrial fibrillation on anticoagulation with Eliquis who presented to the emergency room with complaints of chest pain, diarrhea and abdominal discomfort  1.  Chest pain Radiating to the left jaw.  Patient with known history of CAD status post stent placement and bypass surgery Follows up with Dr. Rockey Situ.  Initial troponin minimally elevated at 21.  Follow-up on exit of troponin.  Twelve-lead EKG with atrial fibrillation. Cardiology consult placed.  2D echocardiogram requested. Patient currently chest pain-free.  PE very unlikely since patient already on anticoagulation with Eliquis  2.  Diarrhea and abdominal discomfort Ongoing for the last 2 days. Patient has chronic leukocytosis due to underlying CLL. Requested for CT abdomen and pelvis without contrast to rule out underlying colitis. Stool studies requested for C. Difficile, ova and parasites and GI panel  3.  Hypotension Noted systolic blood pressure in the 80s in the ED. Total of 500 cc of normal saline bolus ordered.  Patient appears asymptomatic.  Hold off on all blood pressure meds Monitor  4.  Diabetes mellitus type 2 Resume home insulin Placed on sliding scale insulin coverage.  Glycosylated hemoglobin level in a.m.  5.  End-stage renal disease on peritoneal dialysis Patient reports increasing weight gain Nephrology consult placed to make a decision regarding inpatient dialysis  6.  Chronic atrial fibrillation Rate controlled.  Continue home dose of Eliquis  7.  Peripheral artery disease Patient status post bilateral BKA   All the records are reviewed and case discussed with ED provider. Management plans discussed with the patient, daughter at bedside and they are in agreement. I was notified by emergency room provider that patient was found covered in feces at home.  APS referral also to have been initiated Case  manager consulted to assist with safe discharge planning prior to discharge from the hospital.  CODE STATUS: Full code  TOTAL TIME TAKING CARE OF THIS PATIENT: 57 minutes.    Brizeyda Holtmeyer M.D on 06/25/2019 at 1:04 PM  Between 7am to 6pm - Pager - [phone]  After 6pm go to [email or web address] - Proofreader  [facility]  Office  [phone]  CC: Primary care physician; System, Pcp Not In   Note: This dictation was prepared with Dragon dictation along with smaller phrase technology. Any transcriptional errors that result from this process are unintentional.

## 2019-06-26 DIAGNOSIS — Z951 Presence of aortocoronary bypass graft: Secondary | ICD-10-CM | POA: Diagnosis not present

## 2019-06-26 DIAGNOSIS — I132 Hypertensive heart and chronic kidney disease with heart failure and with stage 5 chronic kidney disease, or end stage renal disease: Secondary | ICD-10-CM | POA: Diagnosis present

## 2019-06-26 DIAGNOSIS — N2581 Secondary hyperparathyroidism of renal origin: Secondary | ICD-10-CM | POA: Diagnosis present

## 2019-06-26 DIAGNOSIS — I2721 Secondary pulmonary arterial hypertension: Secondary | ICD-10-CM | POA: Diagnosis present

## 2019-06-26 DIAGNOSIS — I200 Unstable angina: Secondary | ICD-10-CM | POA: Diagnosis not present

## 2019-06-26 DIAGNOSIS — I420 Dilated cardiomyopathy: Secondary | ICD-10-CM | POA: Diagnosis not present

## 2019-06-26 DIAGNOSIS — I4819 Other persistent atrial fibrillation: Secondary | ICD-10-CM

## 2019-06-26 DIAGNOSIS — C9110 Chronic lymphocytic leukemia of B-cell type not having achieved remission: Secondary | ICD-10-CM | POA: Diagnosis present

## 2019-06-26 DIAGNOSIS — Q211 Atrial septal defect: Secondary | ICD-10-CM | POA: Diagnosis not present

## 2019-06-26 DIAGNOSIS — I5043 Acute on chronic combined systolic (congestive) and diastolic (congestive) heart failure: Secondary | ICD-10-CM | POA: Diagnosis present

## 2019-06-26 DIAGNOSIS — N186 End stage renal disease: Secondary | ICD-10-CM | POA: Diagnosis present

## 2019-06-26 DIAGNOSIS — I959 Hypotension, unspecified: Secondary | ICD-10-CM | POA: Diagnosis present

## 2019-06-26 DIAGNOSIS — Z992 Dependence on renal dialysis: Secondary | ICD-10-CM

## 2019-06-26 DIAGNOSIS — Z7901 Long term (current) use of anticoagulants: Secondary | ICD-10-CM | POA: Diagnosis not present

## 2019-06-26 DIAGNOSIS — I82C11 Acute embolism and thrombosis of right internal jugular vein: Secondary | ICD-10-CM | POA: Diagnosis present

## 2019-06-26 DIAGNOSIS — E1122 Type 2 diabetes mellitus with diabetic chronic kidney disease: Secondary | ICD-10-CM | POA: Diagnosis present

## 2019-06-26 DIAGNOSIS — R188 Other ascites: Secondary | ICD-10-CM | POA: Diagnosis present

## 2019-06-26 DIAGNOSIS — N185 Chronic kidney disease, stage 5: Secondary | ICD-10-CM | POA: Diagnosis not present

## 2019-06-26 DIAGNOSIS — L89312 Pressure ulcer of right buttock, stage 2: Secondary | ICD-10-CM | POA: Diagnosis present

## 2019-06-26 DIAGNOSIS — E1151 Type 2 diabetes mellitus with diabetic peripheral angiopathy without gangrene: Secondary | ICD-10-CM | POA: Diagnosis present

## 2019-06-26 DIAGNOSIS — R197 Diarrhea, unspecified: Secondary | ICD-10-CM | POA: Diagnosis not present

## 2019-06-26 DIAGNOSIS — I4821 Permanent atrial fibrillation: Secondary | ICD-10-CM | POA: Diagnosis present

## 2019-06-26 DIAGNOSIS — I252 Old myocardial infarction: Secondary | ICD-10-CM | POA: Diagnosis not present

## 2019-06-26 DIAGNOSIS — I208 Other forms of angina pectoris: Secondary | ICD-10-CM | POA: Diagnosis not present

## 2019-06-26 DIAGNOSIS — Z8674 Personal history of sudden cardiac arrest: Secondary | ICD-10-CM | POA: Diagnosis not present

## 2019-06-26 DIAGNOSIS — I25110 Atherosclerotic heart disease of native coronary artery with unstable angina pectoris: Secondary | ICD-10-CM | POA: Diagnosis present

## 2019-06-26 DIAGNOSIS — I25118 Atherosclerotic heart disease of native coronary artery with other forms of angina pectoris: Secondary | ICD-10-CM | POA: Diagnosis not present

## 2019-06-26 DIAGNOSIS — Z20828 Contact with and (suspected) exposure to other viral communicable diseases: Secondary | ICD-10-CM | POA: Diagnosis present

## 2019-06-26 DIAGNOSIS — D631 Anemia in chronic kidney disease: Secondary | ICD-10-CM | POA: Diagnosis present

## 2019-06-26 DIAGNOSIS — I255 Ischemic cardiomyopathy: Secondary | ICD-10-CM | POA: Diagnosis present

## 2019-06-26 DIAGNOSIS — R079 Chest pain, unspecified: Secondary | ICD-10-CM | POA: Diagnosis present

## 2019-06-26 DIAGNOSIS — I083 Combined rheumatic disorders of mitral, aortic and tricuspid valves: Secondary | ICD-10-CM | POA: Diagnosis present

## 2019-06-26 DIAGNOSIS — I454 Nonspecific intraventricular block: Secondary | ICD-10-CM | POA: Diagnosis present

## 2019-06-26 LAB — BASIC METABOLIC PANEL
Anion gap: 15 (ref 5–15)
BUN: 54 mg/dL — ABNORMAL HIGH (ref 8–23)
CO2: 25 mmol/L (ref 22–32)
Calcium: 7.5 mg/dL — ABNORMAL LOW (ref 8.9–10.3)
Chloride: 93 mmol/L — ABNORMAL LOW (ref 98–111)
Creatinine, Ser: 7.84 mg/dL — ABNORMAL HIGH (ref 0.44–1.00)
GFR calc Af Amer: 5 mL/min — ABNORMAL LOW (ref >60)
GFR calc non Af Amer: 5 mL/min — ABNORMAL LOW (ref >60)
Glucose, Bld: 216 mg/dL — ABNORMAL HIGH (ref 70–99)
Potassium: 3.7 mmol/L (ref 3.5–5.1)
Sodium: 133 mmol/L — ABNORMAL LOW (ref 135–145)

## 2019-06-26 LAB — CBC
HCT: 27.8 % — ABNORMAL LOW (ref 36.0–46.0)
HCT: 30.7 % — ABNORMAL LOW (ref 36.0–46.0)
Hemoglobin: 8.5 g/dL — ABNORMAL LOW (ref 12.0–15.0)
Hemoglobin: 9.3 g/dL — ABNORMAL LOW (ref 12.0–15.0)
MCH: 26.1 pg (ref 26.0–34.0)
MCH: 26.1 pg (ref 26.0–34.0)
MCHC: 30.6 g/dL (ref 30.0–36.0)
MCHC: 30.3 g/dL (ref 30.0–36.0)
MCV: 85.3 fL (ref 80.0–100.0)
MCV: 86.2 fL (ref 80.0–100.0)
Platelets: 180 10*3/uL (ref 150–400)
Platelets: 195 10*3/uL (ref 150–400)
RBC: 3.26 MIL/uL — ABNORMAL LOW (ref 3.87–5.11)
RBC: 3.56 MIL/uL — ABNORMAL LOW (ref 3.87–5.11)
RDW: 17.3 % — ABNORMAL HIGH (ref 11.5–15.5)
RDW: 17.4 % — ABNORMAL HIGH (ref 11.5–15.5)
WBC: 35.7 10*3/uL — ABNORMAL HIGH (ref 4.0–10.5)
WBC: 35 10*3/uL — ABNORMAL HIGH (ref 4.0–10.5)
nRBC: 0 % (ref 0.0–0.2)
nRBC: 0 % (ref 0.0–0.2)

## 2019-06-26 LAB — APTT: aPTT: 90 seconds — ABNORMAL HIGH (ref 24–36)

## 2019-06-26 LAB — OCCULT BLOOD X 1 CARD TO LAB, STOOL: Fecal Occult Bld: NEGATIVE

## 2019-06-26 LAB — HEMOGLOBIN A1C
Hgb A1c MFr Bld: 6.7 % — ABNORMAL HIGH (ref 4.8–5.6)
Mean Plasma Glucose: 145.59 mg/dL

## 2019-06-26 LAB — MAGNESIUM: Magnesium: 1.9 mg/dL (ref 1.7–2.4)

## 2019-06-26 LAB — GLUCOSE, CAPILLARY
Glucose-Capillary: 154 mg/dL — ABNORMAL HIGH (ref 70–99)
Glucose-Capillary: 123 mg/dL — ABNORMAL HIGH (ref 70–99)
Glucose-Capillary: 101 mg/dL — ABNORMAL HIGH (ref 70–99)
Glucose-Capillary: 106 mg/dL — ABNORMAL HIGH (ref 70–99)

## 2019-06-26 LAB — PHOSPHORUS: Phosphorus: 8.8 mg/dL — ABNORMAL HIGH (ref 2.5–4.6)

## 2019-06-26 LAB — HEPARIN LEVEL (UNFRACTIONATED): Heparin Unfractionated: 3.22 IU/mL — ABNORMAL HIGH (ref 0.30–0.70)

## 2019-06-26 MED ORDER — SEVELAMER CARBONATE 800 MG PO TABS
2400.0000 mg | ORAL_TABLET | Freq: Three times a day (TID) | ORAL | Status: DC
  Administered 2019-06-26 – 2019-07-02 (×15): 2400 mg via ORAL
  Filled 2019-06-26 (×15): qty 3

## 2019-06-26 MED ORDER — ACETAMINOPHEN 325 MG PO TABS
650.0000 mg | ORAL_TABLET | Freq: Four times a day (QID) | ORAL | Status: DC | PRN
  Administered 2019-06-26 – 2019-07-01 (×7): 650 mg via ORAL
  Filled 2019-06-26 (×7): qty 2

## 2019-06-26 NOTE — Progress Notes (Signed)
[facility]  ROUNDING NOTE   Subjective:  Patient well-known to [place] as we follow her for outpatient peritoneal dialysis. Came in with chest pain, nausea, and diarrhea. Tolerated peritoneal dialysis well overnight with 1.7 L of ultrafiltration.   Objective:  Vital signs in last 24 hours:  Temp:  [97.6 F (36.4 C)-97.8 F (36.6 C)] 97.6 F (36.4 C) (10/15 0820) Pulse Rate:  [70-79] 74 (10/15 0820) Resp:  [14-20] 18 (10/15 0820) BP: (92-107)/(44-67) 107/58 (10/15 0820) SpO2:  [97 %-100 %] 100 % (10/15 0820) Weight:  [81.2 kg-118.6 kg] 118.6 kg (10/15 0539)  Weight change:  Filed Weights   06/25/19 1037 06/25/19 1954 06/26/19 0539  Weight: 98 kg 81.2 kg 118.6 kg    Intake/Output: I/O last 3 completed shifts: In: 79 [I.V.:73] Out: 55 [Urine:50]   Intake/Output this shift:  Total I/O In: -  Out: 1 [Stool:1]  Physical Exam: General: No acute distress  Head: Normocephalic, atraumatic. Moist oral mucosal membranes  Eyes: Anicteric  Neck: Supple, trachea midline  Lungs:  Basilar rales, normal effort  Heart: S1S2 irregular  Abdomen:  Soft, nontender, bowel sounds present  Extremities: Bilateral BKA  Neurologic: Awake, alert, following commands  Skin: No lesions  Access: PD catheter in place    Basic Metabolic Panel: Recent Labs  Lab 06/25/19 1128 06/26/19 0414  NA 135 133*  K 4.4 3.7  CL 91* 93*  CO2 24 25  GLUCOSE 183* 216*  BUN 57* 54*  CREATININE 8.20* 7.84*  CALCIUM 7.6* 7.5*  MG  --  1.9  PHOS  --  8.8*    Liver Function Tests: Recent Labs  Lab 06/25/19 1235  AST 24  ALT 19  ALKPHOS 74  BILITOT 0.7  PROT 5.3*  ALBUMIN 2.3*   No results for input(s): LIPASE, AMYLASE in the last 168 hours. No results for input(s): AMMONIA in the last 168 hours.  CBC: Recent Labs  Lab 06/25/19 1128 06/26/19 0414  WBC 24.2* 35.7*  HGB 11.2* 8.5*  HCT 35.9* 27.8*  MCV 84.7 85.3  PLT 151 180    Cardiac Enzymes: No results for input(s):  CKTOTAL, CKMB, CKMBINDEX, TROPONINI in the last 168 hours.  BNP: Invalid input(s): POCBNP  CBG: Recent Labs  Lab 06/25/19 1618 06/25/19 1708 06/25/19 2111 06/26/19 0819 06/26/19 1121  GLUCAP 109* 111* 35 154* 123*    Microbiology: Results for orders placed or performed during the hospital encounter of 06/25/19  SARS CORONAVIRUS 2 (TAT 6-24 HRS) Nasopharyngeal Nasopharyngeal Swab     Status: None   Collection Time: 06/25/19 11:27 AM   Specimen: Nasopharyngeal Swab  Result Value Ref Range Status   SARS Coronavirus 2 NEGATIVE NEGATIVE Final    Comment: (NOTE) SARS-CoV-2 target nucleic acids are NOT DETECTED. The SARS-CoV-2 RNA is generally detectable in upper and lower respiratory specimens during the acute phase of infection. Negative results do not preclude SARS-CoV-2 infection, do not rule out co-infections with other pathogens, and should not be used as the sole basis for treatment or other patient management decisions. Negative results must be combined with clinical observations, patient history, and epidemiological information. The expected result is Negative. Fact Sheet for Patients: SugarRoll.be Fact Sheet for Healthcare Providers: https://www.woods-mathews.com/ This test is not yet approved or cleared by the [place] FDA and  has been authorized for detection and/or diagnosis of SARS-CoV-2 by FDA under an Emergency Use Authorization (EUA). This EUA will remain  in effect (meaning this test can be used) for the duration of  the COVID-19 declaration under Section 56 4(b)(1) of the Act, 21 U.S.C. section 360bbb-3(b)(1), unless the authorization is terminated or revoked sooner. Performed at [facility], [place] [address], [place], [place] [address]   C difficile quick scan w PCR reflex     Status: None   Collection Time: 06/25/19  2:55 PM   Specimen: STOOL  Result Value Ref Range Status   C Diff antigen NEGATIVE  NEGATIVE Final   C Diff toxin NEGATIVE NEGATIVE Final   C Diff interpretation No C. difficile detected.  Final    Comment: Performed at [facility], [address] [place], [place] [address]    Coagulation Studies: Recent Labs    06/25/19 1455  LABPROT 20.3*  INR 1.8*    Urinalysis: No results for input(s): COLORURINE, LABSPEC, PHURINE, GLUCOSEU, HGBUR, BILIRUBINUR, KETONESUR, PROTEINUR, UROBILINOGEN, NITRITE, LEUKOCYTESUR in the last 72 hours.  Invalid input(s): APPERANCEUR    Imaging: Ct Abdomen Pelvis Wo Contrast  Result Date: 06/25/2019 CLINICAL DATA:  End-stage renal disease, abdominal pain and diarrhea. Generalized edema and hypotension. EXAM: CT ABDOMEN AND PELVIS WITHOUT CONTRAST TECHNIQUE: Multidetector CT imaging of the abdomen and pelvis was performed following the standard protocol without IV contrast. COMPARISON:  Prior CT of the abdomen and pelvis without contrast on 02/15/2019 FINDINGS: Lower chest: No acute abnormality. Hepatobiliary: Stable appearance of liver. Stable calcified gallstones in the dependent gallbladder. No biliary ductal dilatation. Pancreas: Stable atrophic appearance of the pancreas without obvious evidence of acute pancreatitis or visible mass. Spleen: Normal in size without focal abnormality. Adrenals/Urinary Tract: Stable atrophic bilateral kidneys. No hydronephrosis or focal renal lesions identified. Bladder appears unremarkable with resolution of emphysematous cystitis seen on the prior CT. Stomach/Bowel: Bowel shows no evidence of obstruction, ileus or visible inflammatory process. No free intraperitoneal air identified. Vascular/Lymphatic: Stable calcified aorta and iliac arteries without aneurysmal disease. Stable mildly prominent retroperitoneal lymph nodes in the mid abdomen. Stable mildly enlarged bilateral inguinal and iliac lymph nodes. Reproductive: Status post hysterectomy. No adnexal masses. Other: Tunneled peritoneal dialysis  catheter extends into the pelvis. Free fluid is present in the peritoneal cavity including around the liver and spleen and in the pelvis. Overall amount of free fluid slightly larger than on the prior study, but small in overall volume. Diffuse body wall edema and anasarca present, increased compared to the prior study. Stable right inguinal hernia containing fat. Musculoskeletal: No acute or significant osseous findings. IMPRESSION: 1. Increase in ascites and anasarca. Overall volume of ascites is small, however. A peritoneal dialysis catheter remains within the pelvis. 2. Resolution of emphysematous cystitis seen on the prior CT in June. 3. Stable cholelithiasis. 4. Stable mildly enlarged retroperitoneal, iliac and inguinal lymph nodes. Electronically Signed   By: Aletta Edouard M.D.   On: 06/25/2019 15:53   Dg Chest Port 1 View  Result Date: 06/25/2019 CLINICAL DATA:  Chest pain EXAM: PORTABLE CHEST 1 VIEW COMPARISON:  02/15/2019 FINDINGS: Left-sided implanted cardiac device. Median sternotomy. Stable cardiomegaly. Calcific aortic knob. No focal airspace consolidation. No pleural effusion or pneumothorax. IMPRESSION: No acute cardiopulmonary findings. Electronically Signed   By: Davina Poke M.D.   On: 06/25/2019 11:44     Medications:   . dialysis solution 2.5% low-MG/low-CA     . amitriptyline  25 mg Oral QHS  . atorvastatin  40 mg Oral QHS  . Chlorhexidine Gluconate Cloth  6 each Topical Daily  . cholecalciferol  4,000 Units Oral Daily  . clopidogrel  75 mg Oral Daily  .  extraneal (ICODEXTRIN) peritoneal dialysis solution   Intraperitoneal Q24H  . gabapentin  100 mg Oral BID  . gentamicin cream  1 application Topical Daily  . insulin aspart  0-5 Units Subcutaneous QHS  . insulin aspart  0-9 Units Subcutaneous TID WC  . insulin aspart  10 Units Subcutaneous QAC breakfast   And  . insulin aspart  5 Units Subcutaneous QAC lunch   And  . insulin aspart  10 Units Subcutaneous QAC  supper  . insulin glargine  15 Units Subcutaneous BID  . iron polysaccharides  150 mg Oral Daily  . multivitamin-lutein  1 capsule Oral BID  . pantoprazole  40 mg Oral QHS  . sertraline  100 mg Oral QHS  . vitamin C  500 mg Oral Daily   acetaminophen  Assessment/ Plan:  73 y.o. female with past medical history of ESRD on PD, bilateral BKA, coronary artery disease status post CABG, chronic systolic heart failure, CLL, diabetes mellitus type 2, hypertension, GERD, hyperlipidemia, ischemic cardiomyopathy, peripheral arterial disease, admitted with chest pain, nausea, and diarrhea.  [facility]/peritoneal dialysis  1.  ESRD on peritoneal dialysis.  We will continue peritoneal dialysis with 4 exchanges of 3 L each and 1 last fill using icodextrin.  Good UF of 1.7 L continue to monitor serum electrolytes.  2.  Chest pain.  Further work-up per hospitalist and cardiology.  3.  Anemia of chronic any disease.  Hemoglobin 8.5.  Patient receives Epogen as an outpatient.  She also has underlying CLL.  4.  Secondary hyperparathyroidism.  Phosphorus noted to be quite high at 8.8.  Start the patient on Renvela 3 tablets p.o. 3 times daily with meals.   LOS: 0 Orlene Salmons 10/15/20202:30 PM

## 2019-06-26 NOTE — Progress Notes (Signed)
Progress Note  Patient Name: Unknown Flannigan Date of Encounter: 06/26/2019  Primary Cardiologist: Rockey Situ  Subjective   Noting more abdominal pain this morning over chest pain. Diarrhea persists. HGB has trended down from 11.2-->8.5 this morning with documented vaginal bleeding/clots noted on heparin gtt.   Inpatient Medications    Scheduled Meds: . amitriptyline  25 mg Oral QHS  . atorvastatin  40 mg Oral QHS  . Chlorhexidine Gluconate Cloth  6 each Topical Daily  . cholecalciferol  4,000 Units Oral Daily  . clopidogrel  75 mg Oral Daily  . extraneal (ICODEXTRIN) peritoneal dialysis solution   Intraperitoneal Q24H  . gabapentin  100 mg Oral BID  . gentamicin cream  1 application Topical Daily  . insulin aspart  0-5 Units Subcutaneous QHS  . insulin aspart  0-9 Units Subcutaneous TID WC  . insulin aspart  10 Units Subcutaneous QAC breakfast   And  . insulin aspart  5 Units Subcutaneous QAC lunch   And  . insulin aspart  10 Units Subcutaneous QAC supper  . insulin glargine  15 Units Subcutaneous BID  . iron polysaccharides  150 mg Oral Daily  . multivitamin-lutein  1 capsule Oral BID  . pantoprazole  40 mg Oral QHS  . sertraline  100 mg Oral QHS  . vitamin C  500 mg Oral Daily   Continuous Infusions: . dialysis solution 2.5% low-MG/low-CA    . heparin 1,200 Units/hr (06/25/19 2012)   PRN Meds: acetaminophen   Vital Signs    Vitals:   06/25/19 1605 06/25/19 1954 06/26/19 0539 06/26/19 0820  BP: (!) 102/52 (!) 92/44 (!) 93/56 (!) 107/58  Pulse: 79 74 70 74  Resp: 17 18 20 18   Temp: 97.8 F (36.6 C) 97.6 F (36.4 C) 97.7 F (36.5 C) 97.6 F (36.4 C)  TempSrc: Oral Oral Oral Oral  SpO2: 100% 100% 99% 100%  Weight:  81.2 kg 118.6 kg   Height:        Intake/Output Summary (Last 24 hours) at 06/26/2019 0833 Last data filed at 06/26/2019 0500 Gross per 24 hour  Intake 72.98 ml  Output 50 ml  Net 22.98 ml   Filed Weights   06/25/19 1037 06/25/19 1954  06/26/19 0539  Weight: 98 kg 81.2 kg 118.6 kg    Telemetry    Afib, 70s to 80s bpm, occasional PVCs with occasional ventricular couplets - Personally Reviewed  ECG    No new tracings - Personally Reviewed  Physical Exam   GEN: No acute distress.   Neck: JVD difficult to assess secondary to body habitus. Cardiac: Irregularly irregular, no murmurs, rubs, or gallops.  Respiratory: Clear to auscultation bilaterally.  GI: Soft, nontender, non-distended.   MS: Status post bilateral BKA with 1-2 + stump edema. Neuro:  Alert and oriented x 3; Nonfocal.  Psych: Normal affect.  Labs    Chemistry Recent Labs  Lab 06/25/19 1128 06/25/19 1235 06/26/19 0414  NA 135  --  133*  K 4.4  --  3.7  CL 91*  --  93*  CO2 24  --  25  GLUCOSE 183*  --  216*  BUN 57*  --  54*  CREATININE 8.20*  --  7.84*  CALCIUM 7.6*  --  7.5*  PROT  --  5.3*  --   ALBUMIN  --  2.3*  --   AST  --  24  --   ALT  --  19  --   ALKPHOS  --  74  --   BILITOT  --  0.7  --   GFRNONAA 4*  --  5*  GFRAA 5*  --  5*  ANIONGAP 20*  --  15     Hematology Recent Labs  Lab 06/25/19 1128 06/26/19 0414  WBC 24.2* 35.7*  RBC 4.24 3.26*  HGB 11.2* 8.5*  HCT 35.9* 27.8*  MCV 84.7 85.3  MCH 26.4 26.1  MCHC 31.2 30.6  RDW 17.5* 17.3*  PLT 151 180    Cardiac EnzymesNo results for input(s): TROPONINI in the last 168 hours. No results for input(s): TROPIPOC in the last 168 hours.   BNPNo results for input(s): BNP, PROBNP in the last 168 hours.   DDimer No results for input(s): DDIMER in the last 168 hours.   Radiology    Ct Abdomen Pelvis Wo Contrast  Result Date: 06/25/2019 IMPRESSION: 1. Increase in ascites and anasarca. Overall volume of ascites is small, however. A peritoneal dialysis catheter remains within the pelvis. 2. Resolution of emphysematous cystitis seen on the prior CT in June. 3. Stable cholelithiasis. 4. Stable mildly enlarged retroperitoneal, iliac and inguinal lymph nodes. Electronically  Signed   By: Aletta Edouard M.D.   On: 06/25/2019 15:53   Dg Chest Port 1 View  Result Date: 06/25/2019 IMPRESSION: No acute cardiopulmonary findings. Electronically Signed   By: Davina Poke M.D.   On: 06/25/2019 11:44    Cardiac Studies   2D echo 06/25/2019: 1. Left ventricular ejection fraction, by visual estimation, is 25 to 30%. The left ventricle has severely decreased function. Normal left ventricular size. There is mildly increased left ventricular hypertrophy.  2. Definity contrast agent was given IV to delineate the left ventricular endocardial borders.  3. Indeterminate diastolic filling due to E-A fusion pattern of LV diastolic filling.  4. Global right ventricle has mildly reduced systolic function.The right ventricular size is mildly enlarged. Right vetricular wall thickness was not assessed.  5. Left atrial size was moderately dilated.  6. Right atrial size was normal.  7. Moderate aortic valve annular calcification.  8. The mitral valve is grossly normal. Trace mitral valve regurgitation.  9. The tricuspid valve is grossly normal. Tricuspid valve regurgitation is mild. 10. Aortic valve mean gradient measures 3.0 mmHg. 11. Aortic valve peak gradient measures 5.4 mmHg. 12. The aortic valve is abnormal Aortic valve regurgitation was not visualized by color flow Doppler. 13. The pulmonic valve was not well visualized. Pulmonic valve regurgitation is trivial by color flow Doppler. 14. Moderately elevated pulmonary artery systolic pressure. 15. A pacer wire is visualized. __________  LHC 11/20/2018: Coronary angiography:  Coronary dominance: Right  Left mainstem: Large vessel that bifurcates into the LAD and left circumflex, moderate diffuse disease  Left anterior descending (LAD): Moderate-sized vessel , occluded proximally  LIMA to the mid LAD is patent . LAD beyond the LIMA graft is small and diffusely diseased . Diagonal vessels small and diffusely diseased .  Proximal to mid LAD fills by retrograde flow from LIMA   Left circumflex: Essentially occluded in the proximal region, very small vessel mid to distal. High OM appears to have proximal stent followed by CTO  Right coronary artery (RCA): Native RCA presumed to be occluded from proximal to mid region Vein graft to the RCA patent, large vessel, 90% disease mid PDA  SVG to the RCA/PDA: Patent, no significant disease Saphenous vein graft to OM, 99% disease at the anastomosis, distal edge of the stent  Left ventriculography: Aortic valve crossed for  pressures, no significant aortic valve stenosis, left ventricular end-diastolic pressure 27  Final Conclusions:  Severe three-vessel native disease with occluded RCA, LAD, left circumflex Patent grafts LIMA to the LAD, vein graft to the OM, vein graft to the RCA Severe disease of anastomosis vein graft to the OM estimated 99%  Recommendations:  Case discussed with Dr. Aris Georgia,  He will attempt intervention on the vein graft to the OM. Very high risk procedure given essentially occluded mid to distal LAD. __________  PCI 11/20/2018:  1st Mrg lesion is 95% stenosed.  A drug-eluting stent was successfully placed using a STENT RESOLUTE ONYX 2.5X12.  Post intervention, there is a 0% residual stenosis.  1. High-grade anastomotic stenosis SVG to OM1 2. Successful PCI with DES at the anastomotic site SVG to OM1 __________  2D echo 10/11/2018: 1. The left ventricle has moderately reduced systolic function of 98-92%. The cavity size is mildly increased. There is no left ventricular wall thickness. The left ventricular diastology could not be evaluated due to nondiagnostic images. 2. There is severe hypokinesis of the entire septal, anteroseptal and anterior left ventricular segments. 3. Mildly dilated left atrial size. 4. The mitral valve normal in structure. Regurgitation is mild by color flow Doppler. 5. Normal tricuspid valve.  6. Tricuspid regurgitation mild-moderate. 7. The aortic valve normal. There is moderate sclerosis of the aortic valve. 8. The right ventricle is mildly enlarged in size. There is normal systolic function. Right ventricular systolic pressure is mildly elevated with an estimated pressure of 42.4 mmHg.  Patient Profile     73 y.o. female with history of CAD with cardiac arrest status post three-vessel CABG in 2004 with LIMA to LAD, SVG to OM, and SVG to RCA status post subsequent PCI to vein graft to OM in 11/2018, permanent A. fib diagnosed in 2016 on Eliquis (previously on Coumadin) status post DCCV in 09/2015, 04/2016 with Mobitz type I noted on post tracing status post RFCA in 07/2016 with recurrent A. fib status post repeat TEE guided DCCV in 09/2016 with recurrent arrhythmia and has been maintained in A. fib since, HFpEF secondary to ICM status post ICD, pulmonary hypertension CLL, ESRD on PD, anemia of chronic disease, PAD status post bilateral BKA, recurrent wound infections complicated by bacteremia/Fournier's gangrene, DM2, HTN, HLD, GERD, and obesity who is being seen today for the evaluation of chest pain at the request of Dr. Stark Jock.  Assessment & Plan    1. CAD involving the native coronary arteries and bypass bypass graft status post CABG with subsequent PCI with unstable angina and mildly elevated troponin: -Patient's troponin elevation is not consistent with ACS and is likely to be expected in the setting of her underlying known multivessel native coronary disease and end-stage renal disease -Initially, the plan was for the patient to undergo LHC on 06/27/2019, following 48 hour washout of Eliquis, on heparin gtt, given ongoing symptoms -However, since she was last seen, her pain is more abdominal now over chest pain and her HGB has trended down from 11.2 to 8.5 with noted vaginal bleeding/clots -She will need further evaluation/treatment of her worsening anemia (this does not appear to be  dilutional) prior to undergoing LHC -LHC will be needed prior to discharge, with timing pending at this time -Heparin stopped for now -Recheck CBC this afternoon  -Given her relative hypotension we are unable to escalate/add antianginal therapy -It is unlikely noninvasive imaging with Lexiscan Myoview would be of much benefit -Continue Plavix for now given recent DES  from 11/2018, though will need close monitoring of HGB -Hold losartan and torsemide given hypotension  2.  Acute on chronic HFrEF secondary to ICM: -Volume management per dialysis -Nephrology consulted for dialysis while admitted -Cannot exclude some degree of third spacing in the setting of her hypoalbuminemia with an albumin of 2.3 and underlying anemia  -Echo with slightly worse EF this admission as above -Will need cath this admission, though timing is dependent on her anemia  -Continue to hold losartan and torsemide given hypotension -Not currently on beta-blockade secondary to hypotension -Not on Entresto or spironolactone given hypotension and underlying CKD  3.  Permanent A. fib: -Ventricular rate is reasonably well controlled -Not requiring AV nodal blocking agents at this time -Eliquis was initially discontinued at time of cardiology consult with preparation of LHC; however, with her worsening anemia at this time, LHC will need to be postponed until her anemia is worked up/treated and felt to be stable -Stop heparin drip for now with plans to repeat CBC this afternoon  4.  Abdominal pain/diarrhea: -GI panel pending -CT abdomen with increase in ascites and anasarca  5.  ESRD on PD: -Nephrology on board  6.  PAD: -Status post bilateral BKA -Unable to wear prosthesis at this time secondary to swelling of the stump sites -Volume management as above  7.  Hypotension: -Slightly improved with systolic readings in the 75O to low 100s mmHg  8. Acute on chronic anemia of chronic disease: -HGB trend from  11.2-->8.5 over the past 24 hours, does not appear to be dilutional as she has only received 500 mL of fluids and other cells lines are not reduced comparatively  -Blood/clot noted from vaginal canal overnight -Stop heparin gtt -Further workup of anemia will need to occur prior to undergoing LHC -Check CBC this afternoon to trend  9. Leukocytosis: -Has known CLL making interpretation difficult given her abdominal pain and diarrhea  -Per IM   For questions or updates, please contact [facility] Please consult [email or web address] for contact info under Cardiology/STEMI.    Signed, Christell Faith, PA-C [facility] Pager: [phone] 06/26/2019, 8:33 AM

## 2019-06-26 NOTE — Progress Notes (Signed)
Notified MD that pt had a small amount of blood with a small blood clot to come out of vagina. Pt on heparin gtt going at 12. No new orders at this time.

## 2019-06-26 NOTE — Progress Notes (Signed)
Pt had 7 beat run of Vtach at 1103. Pt was asymptomatic. Will continue to monitor.

## 2019-06-26 NOTE — Progress Notes (Signed)
[facility] [place] NAME: Caroline Hernandez    MR#:  [id number]  DATE OF BIRTH:  1946/02/12  SUBJECTIVE:  CHIEF COMPLAINT:   Chief Complaint  Patient presents with  . Chest Pain   Having diarrhea  REVIEW OF SYSTEMS:    Review of Systems  Constitutional: Positive for malaise/fatigue. Negative for chills and fever.  HENT: Negative for sore throat.   Eyes: Negative for blurred vision, double vision and pain.  Respiratory: Negative for cough, hemoptysis, shortness of breath and wheezing.   Cardiovascular: Positive for chest pain. Negative for palpitations, orthopnea and leg swelling.  Gastrointestinal: Positive for diarrhea. Negative for abdominal pain, constipation, heartburn, nausea and vomiting.  Genitourinary: Negative for dysuria and hematuria.  Musculoskeletal: Negative for back pain and joint pain.  Skin: Negative for rash.  Neurological: Negative for sensory change, speech change, focal weakness and headaches.  Endo/Heme/Allergies: Does not bruise/bleed easily.  Psychiatric/Behavioral: Negative for depression. The patient is not nervous/anxious.     DRUG ALLERGIES:   Allergies  Allergen Reactions  . Piperacillin Other (See Comments)    Renal failure  . 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 See ID note**PER PT-CAUSED RENAL FAILURE**  . Zosyn [Piperacillin Sod-Tazobactam So] Other (See Comments)    Renal failure    VITALS:  Blood pressure (!) 97/52, pulse 72, temperature 97.7 F (36.5 C), temperature source Oral, resp. rate 16, height 5\' 8"  (1.727 m), weight 118.6 kg, SpO2 97 %.  PHYSICAL EXAMINATION:   Physical Exam  GENERAL:  73 y.o.-year-old patient lying in the bed with no acute distress.  EYES: Pupils equal, round, reactive to light and accommodation. No scleral icterus. Extraocular muscles intact.  HEENT: Head atraumatic, normocephalic. Oropharynx and nasopharynx clear.  NECK:   Supple, no jugular venous distention. No thyroid enlargement, no tenderness.  LUNGS: Normal breath sounds bilaterally, no wheezing, rales, rhonchi. No use of accessory muscles of respiration.  CARDIOVASCULAR: S1, S2 normal. No murmurs, rubs, or gallops.  ABDOMEN: Soft, nontender, nondistended. Bowel sounds present. No organomegaly or mass.  EXTREMITIES: No cyanosis, clubbing or edema b/l.    NEUROLOGIC: Cranial nerves II through XII are intact. No focal Motor or sensory deficits b/l.   PSYCHIATRIC: The patient is alert and oriented x 3.  SKIN: No obvious rash, lesion, or ulcer.   LABORATORY PANEL:   CBC Recent Labs  Lab 06/26/19 1551  WBC 35.0*  HGB 9.3*  HCT 30.7*  PLT 195   ------------------------------------------------------------------------------------------------------------------ Chemistries  Recent Labs  Lab 06/25/19 1235 06/26/19 0414  NA  --  133*  K  --  3.7  CL  --  93*  CO2  --  25  GLUCOSE  --  216*  BUN  --  54*  CREATININE  --  7.84*  CALCIUM  --  7.5*  MG  --  1.9  AST 24  --   ALT 19  --   ALKPHOS 74  --   BILITOT 0.7  --    ------------------------------------------------------------------------------------------------------------------  Cardiac Enzymes No results for input(s): TROPONINI in the last 168 hours. ------------------------------------------------------------------------------------------------------------------  RADIOLOGY:  Ct Abdomen Pelvis Wo Contrast  Result Date: 06/25/2019 CLINICAL DATA:  End-stage renal disease, abdominal pain and diarrhea. Generalized edema and hypotension. EXAM: CT ABDOMEN AND PELVIS WITHOUT CONTRAST TECHNIQUE: Multidetector CT imaging of the abdomen and pelvis was performed following the standard protocol without IV contrast. COMPARISON:  Prior CT of the abdomen and pelvis  without contrast on 02/15/2019 FINDINGS: Lower chest: No acute abnormality. Hepatobiliary: Stable appearance of liver. Stable calcified  gallstones in the dependent gallbladder. No biliary ductal dilatation. Pancreas: Stable atrophic appearance of the pancreas without obvious evidence of acute pancreatitis or visible mass. Spleen: Normal in size without focal abnormality. Adrenals/Urinary Tract: Stable atrophic bilateral kidneys. No hydronephrosis or focal renal lesions identified. Bladder appears unremarkable with resolution of emphysematous cystitis seen on the prior CT. Stomach/Bowel: Bowel shows no evidence of obstruction, ileus or visible inflammatory process. No free intraperitoneal air identified. Vascular/Lymphatic: Stable calcified aorta and iliac arteries without aneurysmal disease. Stable mildly prominent retroperitoneal lymph nodes in the mid abdomen. Stable mildly enlarged bilateral inguinal and iliac lymph nodes. Reproductive: Status post hysterectomy. No adnexal masses. Other: Tunneled peritoneal dialysis catheter extends into the pelvis. Free fluid is present in the peritoneal cavity including around the liver and spleen and in the pelvis. Overall amount of free fluid slightly larger than on the prior study, but small in overall volume. Diffuse body wall edema and anasarca present, increased compared to the prior study. Stable right inguinal hernia containing fat. Musculoskeletal: No acute or significant osseous findings. IMPRESSION: 1. Increase in ascites and anasarca. Overall volume of ascites is small, however. A peritoneal dialysis catheter remains within the pelvis. 2. Resolution of emphysematous cystitis seen on the prior CT in June. 3. Stable cholelithiasis. 4. Stable mildly enlarged retroperitoneal, iliac and inguinal lymph nodes. Electronically Signed   By: Aletta Edouard M.D.   On: 06/25/2019 15:53   Dg Chest Port 1 View  Result Date: 06/25/2019 CLINICAL DATA:  Chest pain EXAM: PORTABLE CHEST 1 VIEW COMPARISON:  02/15/2019 FINDINGS: Left-sided implanted cardiac device. Median sternotomy. Stable cardiomegaly. Calcific  aortic knob. No focal airspace consolidation. No pleural effusion or pneumothorax. IMPRESSION: No acute cardiopulmonary findings. Electronically Signed   By: Davina Poke M.D.   On: 06/25/2019 11:44     ASSESSMENT AND PLAN:   Patient is a 73 year old female with history of end-stage renal disease on peritoneal dialysis, chronic combined systolic and diastolic CHF, peripheral artery disease status post bilateral BKA, diabetes mellitus, CLL on chronic atrial fibrillation on anticoagulation with Eliquis who presented to the emergency room with complaints of chest pain, diarrhea and abdominal discomfort  * Chest pain Concerning for unstable angina Cardiology consulted Cath held due to drop in HB Stool negative for blood Scheduled for tomorrow depedning on HB trend  Eliquis held for Cath  *  Diarrhea and abdominal discomfort Ongoing for the last 2 days. Patient has chronic leukocytosis due to underlying CLL. C diff Negative  *  Diabetes mellitus type 2 Resume home insulin Placed on sliding scale insulin coverage.  * End-stage renal disease on peritoneal dialysis Patient reports increasing weight gain Nephrology consulted  *  Chronic atrial fibrillation Rate controlled.  Continue home dose of Eliquis  *  Peripheral artery disease Patient status post bilateral BKA  All the records are reviewed and case discussed with Care Management/Social Worker Management plans discussed with the patient, family and they are in agreement.  CODE STATUS: FULL CODE  DVT Prophylaxis: SCDs  TOTAL TIME TAKING CARE OF THIS PATIENT: 35 minutes.   POSSIBLE D/C IN 2-3 DAYS, DEPENDING ON CLINICAL CONDITION.  Leia Alf Christophr Calix M.D on 06/26/2019 at 4:48 PM  Between 7am to 6pm - Pager - [phone]  After 6pm go to [email or web address] - password [email or web address] [facility]  Office  [phone]  CC: Primary care physician; System, Pcp Not  In  Note: This dictation was prepared  with Dragon dictation along with smaller phrase technology. Any transcriptional errors that result from this process are unintentional.

## 2019-06-26 NOTE — Consult Note (Signed)
[facility] for Heparin Indication: atrial fibrillation  Allergies  Allergen Reactions  . Piperacillin Other (See Comments)    Renal failure  . 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 See ID note**PER PT-CAUSED RENAL FAILURE**  . Zosyn [Piperacillin Sod-Tazobactam So] Other (See Comments)    Renal failure    Patient Measurements: Height: 5\' 8"  (172.7 cm) Weight: 216 lb (98 kg) IBW/kg (Calculated) : 63.9 Heparin Dosing Weight: 85.3  Vital Signs: Temp: 97.6 F (36.4 C) (10/14 1954) Temp Source: Oral (10/14 1954) BP: 92/44 (10/14 1954) Pulse Rate: 74 (10/14 1954)  Labs: Recent Labs    06/25/19 1128 06/25/19 1231 06/25/19 1455 06/25/19 1624 06/26/19 0414  HGB 11.2*  --   --   --  8.5*  HCT 35.9*  --   --   --  27.8*  PLT 151  --   --   --  180  APTT  --   --  48*  --  90*  LABPROT  --   --  20.3*  --   --   INR  --   --  1.8*  --   --   HEPARINUNFRC  --   --  >3.60*  --   --   CREATININE 8.20*  --   --   --   --   TROPONINIHS 21* 22* 22* 22*  --     Estimated Creatinine Clearance: 7.7 mL/min (A) (by C-G formula based on SCr of 8.2 mg/dL (H)).   Medical History: Past Medical History:  Diagnosis Date  . Amputation of left lower extremity below knee ([place])   . Amputation of right lower extremity below knee ([facility])   . CAD (coronary artery disease)    a. 2004 Cardiac Arrest/CABG x 3 (LIMA->LAD, VG->OM, VG->RCA);  b. 06/2015 lexiscan MV: no significant ischemia, EF 48%, low risk->Med Rx.  . Chronic combined systolic and diastolic CHF (congestive heart failure) ([facility])    a. 12/2014 Echo: EF 45-50%;  b. 08/2015 Echo: EF 45-50%, ant, antsept HK, mildly dil LA, nl RV, mild-mod TR, sev PAH (55mmHg); b. 08/2017 TEE: EF 40-45%, large PFO w/ L->R shunting.  . CKD (chronic kidney disease), stage IV ([facility])   . CLL (chronic lymphocytic leukemia) ([facility])   . Diabetes mellitus without complication ([facility])   .  Essential hypertension   . GERD (gastroesophageal reflux disease)   . Hiatal hernia   . History of cardiac arrest    a. 2004.  Marland Kitchen Hyperlipidemia   . Ischemic cardiomyopathy    a. s/p MDT ICD (originally had 2841 lead-->gen change and lead revision ~ 2012 @ [facility]); b. 12/2014 Echo: EF 45-50%;  c.08/2015 Echo: EF 45-50%; d. 08/2017 Echo: EF 40-45%.  Marland Kitchen PAD (peripheral artery disease) ([facility])    a. s/p bilat BKA  . Persistent atrial fibrillation ([place])    a. Dx 12/2014.  CHA2DS2VASc = 6--> warfarin;  b. 09/2015 s/p DCCV-->on amio; c. s/p DCCV 04/25/16; d. 07/2016 s/p RFCA/PVI in setting of recurrent afib despite amio; e. 05/2018 Recurrent afib.    Medications:  Apixaban prior to admission (last dose per patient on 10/14 @ 0830)  Assessment: 73 y/o F with a pmh significant for Afib on apixaban PTA, ESRD on PD, CHF who presented to the ED with chest pain, diarrhea, and abdominal discomfort. Pharmacy consulted for heparin infusion.   10/15 @ 0414 aPTT = 90, therapeutic x 1.  H/H worse, PLTs OK.  Goal of Therapy:  aPTT 66-102 seconds Monitor platelets by anticoagulation protocol: Yes   Plan:  -Last dose of apixaban at ~0830 10/14 per medication reconciliation. - Continue Heparin at current rate and recheck aPTT in 8 hours to confirm. -Will follow aPTT and defer monitoring of heparin levels considering recent apixaban use. -Daily heparin level and CBC per protocol. -Monitor for s/sx bleeding complications  Hart Robinsons A  06/26/2019,5:17 AM

## 2019-06-27 ENCOUNTER — Inpatient Hospital Stay (HOSPITAL_COMMUNITY): Payer: Medicare HMO

## 2019-06-27 DIAGNOSIS — I208 Other forms of angina pectoris: Secondary | ICD-10-CM

## 2019-06-27 DIAGNOSIS — I420 Dilated cardiomyopathy: Secondary | ICD-10-CM

## 2019-06-27 DIAGNOSIS — R079 Chest pain, unspecified: Secondary | ICD-10-CM

## 2019-06-27 DIAGNOSIS — R197 Diarrhea, unspecified: Secondary | ICD-10-CM

## 2019-06-27 DIAGNOSIS — I25118 Atherosclerotic heart disease of native coronary artery with other forms of angina pectoris: Secondary | ICD-10-CM

## 2019-06-27 DIAGNOSIS — D631 Anemia in chronic kidney disease: Secondary | ICD-10-CM

## 2019-06-27 LAB — CBC WITH DIFFERENTIAL/PLATELET
Abs Immature Granulocytes: 0.12 10*3/uL — ABNORMAL HIGH (ref 0.00–0.07)
Basophils Absolute: 0.2 10*3/uL — ABNORMAL HIGH (ref 0.0–0.1)
Basophils Relative: 0 %
Eosinophils Absolute: 0.4 10*3/uL (ref 0.0–0.5)
Eosinophils Relative: 1 %
HCT: 30.6 % — ABNORMAL LOW (ref 36.0–46.0)
Hemoglobin: 9.5 g/dL — ABNORMAL LOW (ref 12.0–15.0)
Immature Granulocytes: 0 %
Lymphocytes Relative: 79 %
Lymphs Abs: 27.5 10*3/uL — ABNORMAL HIGH (ref 0.7–4.0)
MCH: 26.5 pg (ref 26.0–34.0)
MCHC: 31 g/dL (ref 30.0–36.0)
MCV: 85.5 fL (ref 80.0–100.0)
Monocytes Absolute: 0.7 10*3/uL (ref 0.1–1.0)
Monocytes Relative: 2 %
Neutro Abs: 6.1 10*3/uL (ref 1.7–7.7)
Neutrophils Relative %: 18 %
Platelets: 191 10*3/uL (ref 150–400)
RBC: 3.58 MIL/uL — ABNORMAL LOW (ref 3.87–5.11)
RDW: 17.3 % — ABNORMAL HIGH (ref 11.5–15.5)
Smear Review: NORMAL
WBC: 35 10*3/uL — ABNORMAL HIGH (ref 4.0–10.5)
nRBC: 0 % (ref 0.0–0.2)

## 2019-06-27 LAB — GLUCOSE, CAPILLARY
Glucose-Capillary: 163 mg/dL — ABNORMAL HIGH (ref 70–99)
Glucose-Capillary: 45 mg/dL — ABNORMAL LOW (ref 70–99)
Glucose-Capillary: 87 mg/dL (ref 70–99)
Glucose-Capillary: 95 mg/dL (ref 70–99)
Glucose-Capillary: 125 mg/dL — ABNORMAL HIGH (ref 70–99)

## 2019-06-27 LAB — BASIC METABOLIC PANEL
Anion gap: 17 — ABNORMAL HIGH (ref 5–15)
BUN: 52 mg/dL — ABNORMAL HIGH (ref 8–23)
CO2: 25 mmol/L (ref 22–32)
Calcium: 7.7 mg/dL — ABNORMAL LOW (ref 8.9–10.3)
Chloride: 94 mmol/L — ABNORMAL LOW (ref 98–111)
Creatinine, Ser: 7.66 mg/dL — ABNORMAL HIGH (ref 0.44–1.00)
GFR calc Af Amer: 6 mL/min — ABNORMAL LOW (ref >60)
GFR calc non Af Amer: 5 mL/min — ABNORMAL LOW (ref >60)
Glucose, Bld: 164 mg/dL — ABNORMAL HIGH (ref 70–99)
Potassium: 4.2 mmol/L (ref 3.5–5.1)
Sodium: 136 mmol/L (ref 135–145)

## 2019-06-27 LAB — NM MYOCAR MULTI W/SPECT W/WALL MOTION / EF
Estimated workload: 1 METS
Exercise duration (min): 0 min
Exercise duration (sec): 0 s
LV dias vol: 207 mL (ref 46–106)
LV sys vol: 129 mL
MPHR: 149 {beats}/min
Peak HR: 84 {beats}/min
Percent HR: 56 %
Rest HR: 75 {beats}/min
SDS: 2
SRS: 28
SSS: 24
TID: 1.06

## 2019-06-27 LAB — BODY FLUID CELL COUNT WITH DIFFERENTIAL
Eos, Fluid: 0 %
Lymphs, Fluid: 36 %
Monocyte-Macrophage-Serous Fluid: 61 %
Neutrophil Count, Fluid: 3 %
Other Cells, Fluid: 0 %
Total Nucleated Cell Count, Fluid: 9 cu mm

## 2019-06-27 LAB — PATHOLOGIST SMEAR REVIEW

## 2019-06-27 LAB — MRSA PCR SCREENING: MRSA by PCR: NEGATIVE

## 2019-06-27 MED ORDER — ISOSORBIDE MONONITRATE ER 30 MG PO TB24
30.0000 mg | ORAL_TABLET | Freq: Every day | ORAL | Status: DC
  Administered 2019-06-28 – 2019-07-02 (×4): 30 mg via ORAL
  Filled 2019-06-27 (×4): qty 1

## 2019-06-27 MED ORDER — INSULIN GLARGINE 100 UNIT/ML ~~LOC~~ SOLN
12.0000 [IU] | Freq: Two times a day (BID) | SUBCUTANEOUS | Status: DC
  Administered 2019-06-27 – 2019-06-29 (×2): 12 [IU] via SUBCUTANEOUS
  Filled 2019-06-27 (×5): qty 0.12

## 2019-06-27 MED ORDER — REGADENOSON 0.4 MG/5ML IV SOLN
0.4000 mg | Freq: Once | INTRAVENOUS | Status: AC
  Administered 2019-06-27: 0.4 mg via INTRAVENOUS
  Filled 2019-06-27: qty 5

## 2019-06-27 MED ORDER — NITROGLYCERIN 0.4 MG SL SUBL: 0.4000 mg | SUBLINGUAL_TABLET | SUBLINGUAL | Status: DC | PRN

## 2019-06-27 MED ORDER — TECHNETIUM TC 99M TETROFOSMIN IV KIT
10.0000 | PACK | Freq: Once | INTRAVENOUS | Status: AC | PRN
  Administered 2019-06-27: 10.92 via INTRAVENOUS

## 2019-06-27 MED ORDER — TECHNETIUM TC 99M TETROFOSMIN IV KIT
30.0000 | PACK | Freq: Once | INTRAVENOUS | Status: AC | PRN
  Administered 2019-06-27: 33.413 via INTRAVENOUS

## 2019-06-27 NOTE — Progress Notes (Signed)
New referral for [facility] community Palliative to follow post discharge received from [facility]. Patient will also be followed by [facility] home health. Discharge date pending. Patient information given to Palliative referral. Flo Shanks BSN, RN, BSN, [place] [phone]

## 2019-06-27 NOTE — Progress Notes (Signed)
Hypoglycemic Event  CBG: 45  Treatment: 8 oz juice/soda  Symptoms: None  Follow-up CBG: Time: 13:41 CBG Result: 87  Possible Reasons for Event: Inadequate meal intake  Comments/MD notified: Dr. Darvin Neighbours notified via secure chat    Dereck Leep

## 2019-06-27 NOTE — Plan of Care (Signed)
  Problem: Clinical Measurements: Goal: Ability to maintain clinical measurements within normal limits will improve Outcome: Progressing Goal: Cardiovascular complication will be avoided Outcome: Progressing   Problem: Elimination: Goal: Will not experience complications related to bowel motility Outcome: Progressing Goal: Will not experience complications related to urinary retention Outcome: Progressing   Problem: Pain Managment: Goal: General experience of comfort will improve Outcome: Progressing   Problem: Safety: Goal: Ability to remain free from injury will improve Outcome: Progressing   Problem: Skin Integrity: Goal: Risk for impaired skin integrity will decrease Outcome: Progressing

## 2019-06-27 NOTE — TOC Initial Note (Signed)
Transition of Care [facility]) - Initial/Assessment Note    Patient Details  Name: Caroline Hernandez MRN: [id number] Date of Birth: 03-20-1946  Transition of Care [facility]) CM/SW Contact:    [name], RN Phone Number: 06/27/2019, 10:22 AM  Clinical Narrative:      Patient is from home with daughter and granddaughter.  Bilateral below the knee amputation.  Uses prosthetics; wheelchair and scooter.  Chronic O2 at home through [place].  Has ramps at home.  Daughter transports to appointments.   CHF-has a roll up scale which she uses with her wheelchair.  She states she weights daily.  Current with PCP- Dr Apolonio Schneiders with [facility].  Obtains medications at [facility] on [address] without difficulty.  Patient has used [facility] in the past and would like to use again.  Cory with [facility] accepted for RN,PT,OT,SW and aide; HF protocol.  Patient is a peritoneal dialysis patient-Amanda aware.  Offered out patient palliative and patient agreed.  Flo Shanks aware with [facility].  Patient was found in feces in her home laying in her recliner for several days per ED notes.               Expected Discharge Plan: [place] Barriers to Discharge: Continued Medical Work up   Patient Goals and CMS Choice Patient states their goals for this hospitalization and ongoing recovery are:: Go home with home health-[facility] CMS Medicare.gov Compare Post Acute Care list provided to:: Patient Choice offered to / list presented to : Patient  Expected Discharge Plan and Services Expected Discharge Plan: [place]   Discharge Planning Services: CM Consult Post Acute Care Choice: [place] arrangements for the past 2 months: Single Family Home                           HH Arranged: RN, PT, Nurse's Aide, Social Work, OT [place] Agency: [place] Date [place]: 06/27/19 Time [place]: 73 Representative spoke with at [place]: Tommi Rumps  Prior Living Arrangements/Services Living arrangements for the past 2 months: [place] Lives with:: Adult Children   Do you feel safe going back to the place where you live?: Yes            Criminal Activity/Legal Involvement Pertinent to Current Situation/Hospitalization: No - Comment as needed  Activities of Daily Living Home Assistive Devices/Equipment: Wheelchair, CBG Meter ADL Screening (condition at time of admission) Patient's cognitive ability adequate to safely complete daily activities?: Yes Is the patient deaf or have difficulty hearing?: No Does the patient have difficulty seeing, even when wearing glasses/contacts?: No Does the patient have difficulty concentrating, remembering, or making decisions?: No Patient able to express need for assistance with ADLs?: Yes Does the patient have difficulty dressing or bathing?: Yes Independently performs ADLs?: No Does the patient have difficulty walking or climbing stairs?: Yes Weakness of Legs: Both Weakness of Arms/Hands: Both  Permission Sought/Granted   Permission granted to share information with : Yes, Verbal Permission Granted  Share Information with NAME: Tommi Rumps           Emotional Assessment Appearance:: Appears stated age Attitude/Demeanor/Rapport: Gracious Affect (typically observed): Accepting Orientation: : Oriented to Self, Oriented to Place, Oriented to  Time, Oriented to Situation Alcohol / Substance Use: Not Applicable Psych Involvement: No (comment)  Admission diagnosis:  End stage renal disease on dialysis ([place]) [N18.6, Z99.2] Nonspecific  chest pain [R07.9] Patient Active Problem List   Diagnosis Date Noted  . Unstable angina ([place]) 06/26/2019  . NSTEMI (non-ST elevated myocardial infarction) ([place]) 11/18/2018  . CLL (chronic lymphocytic leukemia) ([place]) 10/23/2018  . Pressure injury of skin 10/19/2018  . CHF (congestive heart failure) ([place]) 10/16/2018  . CAP (community  acquired pneumonia) 04/14/2018  . UTI (urinary tract infection) 03/07/2018  . Atrial fibrillation with slow ventricular response ([place]) 07/18/2016  . Chronic diastolic CHF (congestive heart failure) ([place])   . SOB (shortness of breath)   . Acute on chronic systolic heart failure ([place]) 08/20/2015  . CKD (chronic kidney disease), stage IV ([place])   . Ischemic cardiomyopathy   . Chronic combined systolic and diastolic CHF (congestive heart failure) ([place])   . Essential hypertension 06/18/2015  . Systolic and diastolic CHF, acute on chronic ([place])   . Acute on chronic renal failure ([place])   . Angina pectoris ([place])   . Coronary artery disease involving native coronary artery of native heart with angina pectoris with documented spasm ([place])   . 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 ([place])   . Persistent atrial fibrillation ([place])   . Bradycardia   . Chest pain 06/12/2015   PCP:  System, Pcp Not In Pharmacy:   [facility] [id number], [place] [address] Phone: [phone] Fax: [place], [place] - [place] AT [facility] [address] [place] [address] Phone: [phone] Fax: [phone]     Social Determinants of Health ([place]) Interventions    Readmission Risk Interventions Readmission Risk Prevention Plan 02/18/2019  Transportation Screening Complete  PCP or Specialist appointment within 3-5 days of discharge Complete  HRI or [place] (No Data)  SW Recovery Care/Counseling Consult (No Data)  Palliative Care Screening Not Rote Not Applicable  Some recent data might be hidden

## 2019-06-27 NOTE — Progress Notes (Signed)
Return call from Hyman Bible, MD at [phone]  for report of pain in RLQ and bladder area and states that she will place order for urinalysis.

## 2019-06-27 NOTE — Progress Notes (Signed)
Progress Note  Patient Name: Caroline Hernandez Date of Encounter: 06/27/2019  Primary Cardiologist: Ida Rogue, MD   Subjective   No chest pain since yesterday. Only reports nausea but no further episodes of diarrhea.   Inpatient Medications    Scheduled Meds:  amitriptyline  25 mg Oral QHS   atorvastatin  40 mg Oral QHS   Chlorhexidine Gluconate Cloth  6 each Topical Daily   cholecalciferol  4,000 Units Oral Daily   clopidogrel  75 mg Oral Daily   extraneal (ICODEXTRIN) peritoneal dialysis solution   Intraperitoneal Q24H   gabapentin  100 mg Oral BID   gentamicin cream  1 application Topical Daily   insulin aspart  0-5 Units Subcutaneous QHS   insulin aspart  0-9 Units Subcutaneous TID WC   insulin aspart  10 Units Subcutaneous QAC breakfast   And   insulin aspart  5 Units Subcutaneous QAC lunch   And   insulin aspart  10 Units Subcutaneous QAC supper   insulin glargine  15 Units Subcutaneous BID   iron polysaccharides  150 mg Oral Daily   multivitamin-lutein  1 capsule Oral BID   pantoprazole  40 mg Oral QHS   sertraline  100 mg Oral QHS   sevelamer carbonate  2,400 mg Oral TID WC   vitamin C  500 mg Oral Daily   Continuous Infusions:  dialysis solution 2.5% low-MG/low-CA     PRN Meds: acetaminophen   Vital Signs    Vitals:   06/26/19 1513 06/26/19 2049 06/27/19 0457 06/27/19 0736  BP: (!) 97/52 97/63 (!) 90/57 129/77  Pulse: 72 80 73 82  Resp: 16 17 18 19   Temp: 97.7 F (36.5 C) 98.1 F (36.7 C) 98.3 F (36.8 C) 97.9 F (36.6 C)  TempSrc: Oral  Oral   SpO2: 97% 97% 97% 96%  Weight:   116.2 kg   Height:        Intake/Output Summary (Last 24 hours) at 06/27/2019 1202 Last data filed at 06/27/2019 0942 Gross per 24 hour  Intake 240 ml  Output 1584 ml  Net -1344 ml   Last 3 Weights 06/27/2019 06/26/2019 06/25/2019  Weight (lbs) 256 lb 1.6 oz 261 lb 7.5 oz 179 lb  Weight (kg) 116.166 kg 118.6 kg 81.194 kg      Telemetry      Atrial fibrillation, PVCs, rates still 70-80s- Personally Reviewed  ECG    No new tracings- Personally Reviewed  Physical Exam   GEN: No acute distress.   Neck: JVD difficult to assess due to body habitus Cardiac: IRIR, no murmurs, rubs, or gallops.  Respiratory: Clear to auscultation bilaterally. GI: Soft, nontender, non-distended  MS: Bilateral BKA with  Pitting 1-2+ stump edema; No deformity. Neuro:  Nonfocal  Psych: Normal affect   Labs    High Sensitivity Troponin:   Recent Labs  Lab 06/25/19 1128 06/25/19 1231 06/25/19 1455 06/25/19 1624  TROPONINIHS 21* 22* 22* 22*      Cardiac EnzymesNo results for input(s): TROPONINI in the last 168 hours. No results for input(s): TROPIPOC in the last 168 hours.   Chemistry Recent Labs  Lab 06/25/19 1128 06/25/19 1235 06/26/19 0414 06/27/19 0840  NA 135  --  133* 136  K 4.4  --  3.7 4.2  CL 91*  --  93* 94*  CO2 24  --  25 25  GLUCOSE 183*  --  216* 164*  BUN 57*  --  54* 52*  CREATININE 8.20*  --  7.84* 7.66*  CALCIUM 7.6*  --  7.5* 7.7*  PROT  --  5.3*  --   --   ALBUMIN  --  2.3*  --   --   AST  --  24  --   --   ALT  --  19  --   --   ALKPHOS  --  74  --   --   BILITOT  --  0.7  --   --   GFRNONAA 4*  --  5* 5*  GFRAA 5*  --  5* 6*  ANIONGAP 20*  --  15 17*     Hematology Recent Labs  Lab 06/26/19 0414 06/26/19 1551 06/27/19 0840  WBC 35.7* 35.0* 35.0*  RBC 3.26* 3.56* 3.58*  HGB 8.5* 9.3* 9.5*  HCT 27.8* 30.7* 30.6*  MCV 85.3 86.2 85.5  MCH 26.1 26.1 26.5  MCHC 30.6 30.3 31.0  RDW 17.3* 17.4* 17.3*  PLT 180 195 191    BNPNo results for input(s): BNP, PROBNP in the last 168 hours.   DDimer No results for input(s): DDIMER in the last 168 hours.   Radiology    Ct Abdomen Pelvis Wo Contrast  Result Date: 06/25/2019 CLINICAL DATA:  End-stage renal disease, abdominal pain and diarrhea. Generalized edema and hypotension. EXAM: CT ABDOMEN AND PELVIS WITHOUT CONTRAST TECHNIQUE: Multidetector  CT imaging of the abdomen and pelvis was performed following the standard protocol without IV contrast. COMPARISON:  Prior CT of the abdomen and pelvis without contrast on 02/15/2019 FINDINGS: Lower chest: No acute abnormality. Hepatobiliary: Stable appearance of liver. Stable calcified gallstones in the dependent gallbladder. No biliary ductal dilatation. Pancreas: Stable atrophic appearance of the pancreas without obvious evidence of acute pancreatitis or visible mass. Spleen: Normal in size without focal abnormality. Adrenals/Urinary Tract: Stable atrophic bilateral kidneys. No hydronephrosis or focal renal lesions identified. Bladder appears unremarkable with resolution of emphysematous cystitis seen on the prior CT. Stomach/Bowel: Bowel shows no evidence of obstruction, ileus or visible inflammatory process. No free intraperitoneal air identified. Vascular/Lymphatic: Stable calcified aorta and iliac arteries without aneurysmal disease. Stable mildly prominent retroperitoneal lymph nodes in the mid abdomen. Stable mildly enlarged bilateral inguinal and iliac lymph nodes. Reproductive: Status post hysterectomy. No adnexal masses. Other: Tunneled peritoneal dialysis catheter extends into the pelvis. Free fluid is present in the peritoneal cavity including around the liver and spleen and in the pelvis. Overall amount of free fluid slightly larger than on the prior study, but small in overall volume. Diffuse body wall edema and anasarca present, increased compared to the prior study. Stable right inguinal hernia containing fat. Musculoskeletal: No acute or significant osseous findings. IMPRESSION: 1. Increase in ascites and anasarca. Overall volume of ascites is small, however. A peritoneal dialysis catheter remains within the pelvis. 2. Resolution of emphysematous cystitis seen on the prior CT in June. 3. Stable cholelithiasis. 4. Stable mildly enlarged retroperitoneal, iliac and inguinal lymph nodes.  Electronically Signed   By: Aletta Edouard M.D.   On: 06/25/2019 15:53    Cardiac Studies   2D echo 06/25/2019: 1. Left ventricular ejection fraction, by visual estimation, is 25 to 30%. The left ventricle has severely decreased function. Normal left ventricular size. There is mildly increased left ventricular hypertrophy. 2. Definity contrast agent was given IV to delineate the left ventricular endocardial borders. 3. Indeterminate diastolic filling due to E-A fusion pattern of LV diastolic filling. 4. Global right ventricle has mildly reduced systolic function.The right ventricular size is mildly enlarged. Right  vetricular wall thickness was not assessed. 5. Left atrial size was moderately dilated. 6. Right atrial size was normal. 7. Moderate aortic valve annular calcification. 8. The mitral valve is grossly normal. Trace mitral valve regurgitation. 9. The tricuspid valve is grossly normal. Tricuspid valve regurgitation is mild. 10. Aortic valve mean gradient measures 3.0 mmHg. 11. Aortic valve peak gradient measures 5.4 mmHg. 12. The aortic valve is abnormal Aortic valve regurgitation was not visualized by color flow Doppler. 13. The pulmonic valve was not well visualized. Pulmonic valve regurgitation is trivial by color flow Doppler. 14. Moderately elevated pulmonary artery systolic pressure. 15. A pacer wire is visualized. __________  LHC 11/20/2018: Coronary angiography:  Coronary dominance: Right  Left mainstem: Large vessel that bifurcates into the LAD and left circumflex, moderate diffuse disease  Left anterior descending (LAD): Moderate-sized vessel , occluded proximally  LIMA to the mid LAD is patent . LAD beyond the LIMA graft is small and diffusely diseased . Diagonal vessels small and diffusely diseased . Proximal to mid LAD fills by retrograde flow from LIMA   Left circumflex: Essentially occluded in the proximal region, very small vessel mid to distal.  High OM appears to have proximal stent followed by CTO  Right coronary artery (RCA): Native RCA presumed to be occluded from proximal to mid region Vein graft to the RCA patent, large vessel, 90% disease mid PDA  SVG to the RCA/PDA: Patent, no significant disease Saphenous vein graft to OM, 99% disease at the anastomosis, distal edge of the stent  Left ventriculography: Aortic valve crossed for pressures, no significant aortic valve stenosis, left ventricular end-diastolic pressure 27  Final Conclusions:  Severe three-vessel native disease with occluded RCA, LAD, left circumflex Patent grafts LIMA to the LAD, vein graft to the OM, vein graft to the RCA Severe disease of anastomosis vein graft to the OM estimated 99%  Recommendations:  Case discussed with Dr. Aris Georgia,  He will attempt intervention on the vein graft to the OM. Very high risk procedure given essentially occluded mid to distal LAD. __________  PCI 11/20/2018:  1st Mrg lesion is 95% stenosed.  A drug-eluting stent was successfully placed using a STENT RESOLUTE ONYX 2.5X12.  Post intervention, there is a 0% residual stenosis.  1. High-grade anastomotic stenosis SVG to OM1 2. Successful PCI with DES at the anastomotic site SVG to OM1 __________  2D echo 10/11/2018: 1. The left ventricle has moderately reduced systolic function of 96-22%. The cavity size is mildly increased. There is no left ventricular wall thickness. The left ventricular diastology could not be evaluated due to nondiagnostic images. 2. There is severe hypokinesis of the entire septal, anteroseptal and anterior left ventricular segments. 3. Mildly dilated left atrial size. 4. The mitral valve normal in structure. Regurgitation is mild by color flow Doppler. 5. Normal tricuspid valve. 6. Tricuspid regurgitation mild-moderate. 7. The aortic valve normal. There is moderate sclerosis of the aortic valve. 8. The right ventricle is  mildly enlarged in size. There is normal systolic function. Right ventricular systolic pressure is mildly elevated with an estimated pressure of 42.4 mmHg.  Patient Profile     73 y.o. female with history of CAD with cardiac arrest s/p 3v CABG in 2004 with LIMA to LAD, SVG to OM, and SVG to RCA s/p subsequent PCI to vein graft to OM in 11/2018, permanent Afib diagnosed in 2016 on Eliquis (previously on Coumadin) s/p DCCV in 09/2015, 04/2016 with Mobitz type I noted on post tracing s/p RFCA  in 07/2016 with recurrent Afib s/p repeat TEE guided DCCV in 09/2016 with recurrent arrhythmia and has been maintained in Afib since, HFpEF 2/2 ICM s/p ICD, pulmonary HTN CLL, ESRD on PD, anemia of chronic dz, PAD s/p b/l BKA, recurrent wound infections complicated by bacteremia / Fournier's gangrene, DM2, HTN, HLD, GERD, and obesity, and who is being seen today for the evaluation of chest pain.   Assessment & Plan    CAD involving the native arteries and bypass graft s/p CABG with subsequent PCI with unstable angina and mildly elevated troponin --HS Tn elevation not consistent with ACS and likely exacerbated in the setting of known multivessel native CAD and ESRD.  --Initial plan to undergo LHC following Eliquis washout, given ongoing CP; however, she developed severe abdominal pain with subsequent drop in Hgb from 11.2  8.5 with noted vaginal bleeding / clots and required further evaluation / tx of anemia prior to [facility]. Heparin was stopped with recheck in CBC showing improvement (Most recently, 9.5). Continue close monitoring of Hgb.  --Lexiscan scheduled this morning after detailed MD/patient discussion regarding current treatment and evaluation options and with consideration of her comorbid conditions.  --Further recommendations regarding LHC pending those results and dependent on her anemia. --Antianginal therapy has been limited by her relative hypotension. Losartan and torsemide held given hypotension. Plavix  continued d/t recent DES 11/2018.  Acute on chronic HFrEF 2/2 ICM  --Volume management per dialysis. Consider edema 2/2 third degree spacing with hypoalbuminemia and anemia.  --Echo with slightly reduced EF as above. --Further recommendations regarding LHC following Lexiscan results and dependent on anemia / comorbid conditions.  --Continue to hold Losartan and torsemide with hypotension. Not on Entresto or spironolactone 2/2 hypotension / ESRD.   Permanent Afib --Ventricular rate remains well controlled. No AB nodal blocking agents needed at this time. Anticoagulation limited by patient's anemia. Further workup of anemia needed at this time. Continue to monitor with daily CBC.  Abdominal pain / diarrhea --Improving, per patient. No diarrhea today. Per IM.  ESRD on PD Acute on chronic anemia of chronic dz --Per Nephrology  PAD --S/p bilateral BKA. Unable to wear prosthesis 2/2 stump edema. Volume management as above.   Hypotension --Continue to monitor. Hold torsemide and Losartan with hypotension as above.  Anemia of unknown etiology --Does not appear dilutional, given all other cell lines were not reduced comparatively. Drop reported yesterday from 11.2  8.5 not consistent with that of her known / underlying anemia of chronic dz. With cessation of heparin and since yesterday, Hgb improved to now 9.5. Blood clot reported in vaginal canal also concerning. Further workup recommended before LHC.  Leukocytosis --Known CLL.      For questions or updates, please contact [place] Please consult [email or web address] for contact info under        Signed, Arvil Chaco, PA-C  06/27/2019, 12:02 PM

## 2019-06-27 NOTE — Progress Notes (Signed)
[facility] [place] NAME: Caroline Hernandez    MR#:  [id number]  DATE OF BIRTH:  1946/08/19  SUBJECTIVE:  CHIEF COMPLAINT:   Chief Complaint  Patient presents with  . Chest Pain   No further diarrhea. Complains of abdominal pain  REVIEW OF SYSTEMS:    Review of Systems  Constitutional: Positive for malaise/fatigue. Negative for chills and fever.  HENT: Negative for sore throat.   Eyes: Negative for blurred vision, double vision and pain.  Respiratory: Negative for cough, hemoptysis, shortness of breath and wheezing.   Cardiovascular: Positive for chest pain. Negative for palpitations, orthopnea and leg swelling.  Gastrointestinal: Positive for diarrhea. Negative for abdominal pain, constipation, heartburn, nausea and vomiting.  Genitourinary: Negative for dysuria and hematuria.  Musculoskeletal: Negative for back pain and joint pain.  Skin: Negative for rash.  Neurological: Negative for sensory change, speech change, focal weakness and headaches.  Endo/Heme/Allergies: Does not bruise/bleed easily.  Psychiatric/Behavioral: Negative for depression. The patient is not nervous/anxious.    DRUG ALLERGIES:   Allergies  Allergen Reactions  . Piperacillin Other (See Comments)    Renal failure  . 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 See ID note**PER PT-CAUSED RENAL FAILURE**  . Zosyn [Piperacillin Sod-Tazobactam So] Other (See Comments)    Renal failure    VITALS:  Blood pressure 129/77, pulse 82, temperature 97.9 F (36.6 C), resp. rate 19, height 5\' 8"  (1.727 m), weight 116.2 kg, SpO2 96 %.  PHYSICAL EXAMINATION:   Physical Exam  GENERAL:  73 y.o.-year-old patient lying in the bed with no acute distress.  EYES: Pupils equal, round, reactive to light and accommodation. No scleral icterus. Extraocular muscles intact.  HEENT: Head atraumatic, normocephalic. Oropharynx and nasopharynx clear.   NECK:  Supple, no jugular venous distention. No thyroid enlargement, no tenderness.  LUNGS: Normal breath sounds bilaterally, no wheezing, rales, rhonchi. No use of accessory muscles of respiration.  CARDIOVASCULAR: S1, S2 normal. No murmurs, rubs, or gallops.  ABDOMEN: Soft, diffusely tender, nondistended. Bowel sounds present. No organomegaly or mass.  PD catheter in place EXTREMITIES: No cyanosis, clubbing or edema b/l.    NEUROLOGIC: Cranial nerves II through XII are intact. No focal Motor or sensory deficits b/l.   PSYCHIATRIC: The patient is alert and oriented x 3.  SKIN: No obvious rash, lesion, or ulcer.   LABORATORY PANEL:   CBC Recent Labs  Lab 06/27/19 0840  WBC 35.0*  HGB 9.5*  HCT 30.6*  PLT 191   ------------------------------------------------------------------------------------------------------------------ Chemistries  Recent Labs  Lab 06/25/19 1235 06/26/19 0414 06/27/19 0840  NA  --  133* 136  K  --  3.7 4.2  CL  --  93* 94*  CO2  --  25 25  GLUCOSE  --  216* 164*  BUN  --  54* 52*  CREATININE  --  7.84* 7.66*  CALCIUM  --  7.5* 7.7*  MG  --  1.9  --   AST 24  --   --   ALT 19  --   --   ALKPHOS 74  --   --   BILITOT 0.7  --   --    ------------------------------------------------------------------------------------------------------------------  Cardiac Enzymes No results for input(s): TROPONINI in the last 168 hours. ------------------------------------------------------------------------------------------------------------------  RADIOLOGY:  Ct Abdomen Pelvis Wo Contrast  Result Date: 06/25/2019 CLINICAL DATA:  End-stage renal disease, abdominal pain and diarrhea. Generalized edema and hypotension. EXAM: CT ABDOMEN AND  PELVIS WITHOUT CONTRAST TECHNIQUE: Multidetector CT imaging of the abdomen and pelvis was performed following the standard protocol without IV contrast. COMPARISON:  Prior CT of the abdomen and pelvis without contrast on  02/15/2019 FINDINGS: Lower chest: No acute abnormality. Hepatobiliary: Stable appearance of liver. Stable calcified gallstones in the dependent gallbladder. No biliary ductal dilatation. Pancreas: Stable atrophic appearance of the pancreas without obvious evidence of acute pancreatitis or visible mass. Spleen: Normal in size without focal abnormality. Adrenals/Urinary Tract: Stable atrophic bilateral kidneys. No hydronephrosis or focal renal lesions identified. Bladder appears unremarkable with resolution of emphysematous cystitis seen on the prior CT. Stomach/Bowel: Bowel shows no evidence of obstruction, ileus or visible inflammatory process. No free intraperitoneal air identified. Vascular/Lymphatic: Stable calcified aorta and iliac arteries without aneurysmal disease. Stable mildly prominent retroperitoneal lymph nodes in the mid abdomen. Stable mildly enlarged bilateral inguinal and iliac lymph nodes. Reproductive: Status post hysterectomy. No adnexal masses. Other: Tunneled peritoneal dialysis catheter extends into the pelvis. Free fluid is present in the peritoneal cavity including around the liver and spleen and in the pelvis. Overall amount of free fluid slightly larger than on the prior study, but small in overall volume. Diffuse body wall edema and anasarca present, increased compared to the prior study. Stable right inguinal hernia containing fat. Musculoskeletal: No acute or significant osseous findings. IMPRESSION: 1. Increase in ascites and anasarca. Overall volume of ascites is small, however. A peritoneal dialysis catheter remains within the pelvis. 2. Resolution of emphysematous cystitis seen on the prior CT in June. 3. Stable cholelithiasis. 4. Stable mildly enlarged retroperitoneal, iliac and inguinal lymph nodes. Electronically Signed   By: Aletta Edouard M.D.   On: 06/25/2019 15:53   ASSESSMENT AND PLAN:   Patient is a 73 year old female with history of end-stage renal disease on  peritoneal dialysis, chronic combined systolic and diastolic CHF, peripheral artery disease status post bilateral BKA, diabetes mellitus, CLL on chronic atrial fibrillation on anticoagulation with Eliquis who presented to the emergency room with complaints of chest pain, diarrhea and abdominal discomfort  * Chest pain Concerning for unstable angina Cardiology consulted Cath held due to drop in HB intially. Heparin stopped. Hb now stabilized Stool negative for blood Eliquis held for Cath Stress test later today  *  Diarrhea and abdominal discomfort Ongoing for the last 2 days. Patient has chronic leukocytosis due to underlying CLL. C diff Negative Abd pain- Discussed with Dr. Holley Raring. No concern for SBP as PD fluid is clear.  *  Diabetes mellitus type 2 Resume home insulin Placed on sliding scale insulin coverage.  * End-stage renal disease on peritoneal dialysis Patient reports increasing weight gain Nephrology consulted  *  Chronic atrial fibrillation Rate controlled.  Continue home dose of Eliquis  *  Peripheral artery disease Patient status post bilateral BKA  * Anemia of chronic disease. Stable. Baseline 9.5-10  All the records are reviewed and case discussed with Care Management/Social Worker Management plans discussed with the patient, family and they are in agreement.  CODE STATUS: FULL CODE  DVT Prophylaxis: SCDs  TOTAL TIME TAKING CARE OF THIS PATIENT: 35 minutes.   POSSIBLE D/C IN 2-3 DAYS, DEPENDING ON CLINICAL CONDITION.  Leia Alf Phylliss Strege M.D on 06/27/2019 at 1:42 PM  Between 7am to 6pm - Pager - [phone]  After 6pm go to [email or web address] - password [email or web address] [facility]  Office  [phone]  CC: Primary care physician; System, Pcp Not In  Note: This dictation was prepared with Dragon dictation  along with smaller phrase technology. Any transcriptional errors that result from this process are unintentional.

## 2019-06-27 NOTE — Progress Notes (Addendum)
Per Discussion with pt at bedside:  Pt inquired about fluid sample taken for specimen (body fluid cell count and culture) earlier this afternoon, pt states that the abdominal pain located in "RLQ and bladder area" started on yesterday 06/26/19 and feels similar to pain felt from previous UTI that she has experienced. Holley Raring, MD notified as well as primary care nurse Iran Sizer, RN. Page place to on call provider at 40.

## 2019-06-27 NOTE — Progress Notes (Signed)
Established Peritoneal Dialysis patient known at [employer] Please contact me directly with any dialysis placement concerns.  Elvera Bicker Dialysis Coordinator [phone]

## 2019-06-27 NOTE — Progress Notes (Signed)
[facility]  ROUNDING NOTE   Subjective:  Patient tolerating peritoneal dialysis well. However this a.m. she was complaining of generalized abdominal pain. Peritoneal dialysate was viewed personally and found to be clear. WBC count in PD fluid was 9.  Objective:  Vital signs in last 24 hours:  Temp:  [97.9 F (36.6 C)-98.3 F (36.8 C)] 97.9 F (36.6 C) (10/16 0736) Pulse Rate:  [73-82] 82 (10/16 0736) Resp:  [17-19] 19 (10/16 0736) BP: (90-129)/(57-77) 129/77 (10/16 0736) SpO2:  [96 %-97 %] 96 % (10/16 0736) Weight:  [116.2 kg] 116.2 kg (10/16 0457)  Weight change: 18.2 kg Filed Weights   06/25/19 1954 06/26/19 0539 06/27/19 0457  Weight: 81.2 kg 118.6 kg 116.2 kg    Intake/Output: I/O last 3 completed shifts: In: 313 [P.O.:240; I.V.:73] Out: 151 [Urine:150; Stool:1]   Intake/Output this shift:  Total I/O In: -  Out: 1483 [Other:1483]  Physical Exam: General: No acute distress  Head: Normocephalic, atraumatic. Moist oral mucosal membranes  Eyes: Anicteric  Neck: Supple, trachea midline  Lungs:  Basilar rales, normal effort  Heart: S1S2 irregular  Abdomen:  Soft, mild diffuse tenderness.  Extremities: Bilateral BKA, edema in both BKA stumps.  Neurologic: Awake, alert, following commands  Skin: No lesions  Access: PD catheter in place    Basic Metabolic Panel: Recent Labs  Lab 06/25/19 1128 06/26/19 0414 06/27/19 0840  NA 135 133* 136  K 4.4 3.7 4.2  CL 91* 93* 94*  CO2 24 25 25   GLUCOSE 183* 216* 164*  BUN 57* 54* 52*  CREATININE 8.20* 7.84* 7.66*  CALCIUM 7.6* 7.5* 7.7*  MG  --  1.9  --   PHOS  --  8.8*  --     Liver Function Tests: Recent Labs  Lab 06/25/19 1235  AST 24  ALT 19  ALKPHOS 74  BILITOT 0.7  PROT 5.3*  ALBUMIN 2.3*   No results for input(s): LIPASE, AMYLASE in the last 168 hours. No results for input(s): AMMONIA in the last 168 hours.  CBC: Recent Labs  Lab 06/25/19 1128 06/26/19 0414 06/26/19 1551  06/27/19 0840  WBC 24.2* 35.7* 35.0* 35.0*  NEUTROABS  --   --   --  6.1  HGB 11.2* 8.5* 9.3* 9.5*  HCT 35.9* 27.8* 30.7* 30.6*  MCV 84.7 85.3 86.2 85.5  PLT 151 180 195 191    Cardiac Enzymes: No results for input(s): CKTOTAL, CKMB, CKMBINDEX, TROPONINI in the last 168 hours.  BNP: Invalid input(s): POCBNP  CBG: Recent Labs  Lab 06/26/19 1747 06/26/19 2129 06/27/19 0738 06/27/19 1328 06/27/19 1341  GLUCAP 101* 106* 163* 45* 87    Microbiology: Results for orders placed or performed during the hospital encounter of 06/25/19  SARS CORONAVIRUS 2 (TAT 6-24 HRS) Nasopharyngeal Nasopharyngeal Swab     Status: None   Collection Time: 06/25/19 11:27 AM   Specimen: Nasopharyngeal Swab  Result Value Ref Range Status   SARS Coronavirus 2 NEGATIVE NEGATIVE Final    Comment: (NOTE) SARS-CoV-2 target nucleic acids are NOT DETECTED. The SARS-CoV-2 RNA is generally detectable in upper and lower respiratory specimens during the acute phase of infection. Negative results do not preclude SARS-CoV-2 infection, do not rule out co-infections with other pathogens, and should not be used as the sole basis for treatment or other patient management decisions. Negative results must be combined with clinical observations, patient history, and epidemiological information. The expected result is Negative. Fact Sheet for Patients: SugarRoll.be Fact Sheet for Healthcare Providers: https://www.woods-mathews.com/  This test is not yet approved or cleared by the [place] and  has been authorized for detection and/or diagnosis of SARS-CoV-2 by FDA under an Emergency Use Authorization (EUA). This EUA will remain  in effect (meaning this test can be used) for the duration of the COVID-19 declaration under Section 56 4(b)(1) of the Act, 21 U.S.C. section 360bbb-3(b)(1), unless the authorization is terminated or revoked sooner. Performed at [facility], [place] [address] [place], [place] [address]   C difficile quick scan w PCR reflex     Status: None   Collection Time: 06/25/19  2:55 PM   Specimen: STOOL  Result Value Ref Range Status   C Diff antigen NEGATIVE NEGATIVE Final   C Diff toxin NEGATIVE NEGATIVE Final   C Diff interpretation No C. difficile detected.  Final    Comment: Performed at [facility], [address] [place], [place] [address]  MRSA PCR Screening     Status: None   Collection Time: 06/26/19 10:37 PM   Specimen: Nasopharyngeal  Result Value Ref Range Status   MRSA by PCR NEGATIVE NEGATIVE Final    Comment:        The GeneXpert MRSA Assay (FDA approved for NASAL specimens only), is one component of a comprehensive MRSA colonization surveillance program. It is not intended to diagnose MRSA infection nor to guide or monitor treatment for MRSA infections. Performed at [facility], [address] [place], [place] [address]   Body fluid culture     Status: None (Preliminary result)   Collection Time: 06/27/19 10:20 AM   Specimen: Peritoneal Washings; Body Fluid  Result Value Ref Range Status   Specimen Description   Final    PERITONEAL Performed at [facility], [address] [place], [place] [address]    Special Requests   Final    Normal Performed at [facility], [address] [place], [place] [address]    Gram Stain   Final    NO WBC SEEN NO ORGANISMS SEEN Performed at [facility], [place] [address], [place], [place] [address]    Culture PENDING  Incomplete   Report Status PENDING  Incomplete    Coagulation Studies: Recent Labs    06/25/19 1455  LABPROT 20.3*  INR 1.8*    Urinalysis: No results for input(s): COLORURINE, LABSPEC, PHURINE, GLUCOSEU, HGBUR, BILIRUBINUR, KETONESUR, PROTEINUR, UROBILINOGEN, NITRITE, LEUKOCYTESUR in the last 72 hours.  Invalid input(s): APPERANCEUR    Imaging: No results found.   Medications:    . dialysis solution 2.5% low-MG/low-CA     . amitriptyline  25 mg Oral QHS  . atorvastatin  40 mg Oral QHS  . Chlorhexidine Gluconate Cloth  6 each Topical Daily  . cholecalciferol  4,000 Units Oral Daily  . clopidogrel  75 mg Oral Daily  . extraneal (ICODEXTRIN) peritoneal dialysis solution   Intraperitoneal Q24H  . gabapentin  100 mg Oral BID  . gentamicin cream  1 application Topical Daily  . insulin aspart  0-5 Units Subcutaneous QHS  . insulin aspart  0-9 Units Subcutaneous TID WC  . insulin aspart  10 Units Subcutaneous QAC breakfast   And  . insulin aspart  5 Units Subcutaneous QAC lunch   And  . insulin aspart  10 Units Subcutaneous QAC supper  . insulin glargine  12 Units Subcutaneous BID  . iron polysaccharides  150 mg Oral Daily  . [START ON 06/28/2019] isosorbide mononitrate  30 mg  Oral Daily  . multivitamin-lutein  1 capsule Oral BID  . pantoprazole  40 mg Oral QHS  . sertraline  100 mg Oral QHS  . sevelamer carbonate  2,400 mg Oral TID WC  . vitamin C  500 mg Oral Daily   acetaminophen, [START ON 06/28/2019] nitroGLYCERIN  Assessment/ Plan:  73 y.o. female with past medical history of ESRD on PD, bilateral BKA, coronary artery disease status post CABG, chronic systolic heart failure, CLL, diabetes mellitus type 2, hypertension, GERD, hyperlipidemia, ischemic cardiomyopathy, peripheral arterial disease, admitted with chest pain, nausea, and diarrhea.  [facility]/peritoneal dialysis  1.  ESRD on peritoneal dialysis.  Patient was complaining of abdominal pain this a.m. which was generalized.  We obtained PD fluid cell count which was found to be 9 therefore doubt peritonitis.  No indication for antibiotics.  Continue peritoneal dialysis.  2.  Chest pain.  Further work-up per hospitalist and cardiology.  3.  Anemia of chronic any disease.  Continue to monitor hemoglobin periodically.  Patient to be maintained on Epogen as an outpatient.  4.  Secondary  hyperparathyroidism.  Maintain the patient on Renvela to 3 tablets p.o. 3 times daily with meals.  Monitor serum phosphorus periodically.   LOS: 1 Caroline Hernandez 10/16/20203:39 PM

## 2019-06-27 NOTE — Progress Notes (Signed)
Inpatient Diabetes Program Recommendations  AACE/ADA: New Consensus Statement on Inpatient Glycemic Control  Target Ranges:  Prepandial:   less than 140 mg/dL      Peak postprandial:   less than 180 mg/dL (1-2 hours)      Critically ill patients:  140 - 180 mg/dL   Results for Caroline Hernandez, Caroline Hernandez (MRN [id number]) as of 06/27/2019 13:44  Ref. Range 06/27/2019 07:38 06/27/2019 13:28 06/27/2019 13:41  Glucose-Capillary Latest Ref Range: 70 - 99 mg/dL 163 (H)  Novolog 12 units 45 (L) 87  Results for Caroline Hernandez, [name] (MRN [id number]) as of 06/27/2019 13:44  Ref. Range 06/26/2019 08:19 06/26/2019 11:21 06/26/2019 17:47 06/26/2019 21:29  Glucose-Capillary Latest Ref Range: 70 - 99 mg/dL 154 (H) 123 (H) 101 (H) 106 (H)   Review of Glycemic Control  Diabetes history: DM2 Outpatient Diabetes medications: Lantus 15 units BID, Novolog 10 units with breakfast, Novolog 5 units with lunch, Novolog 10 units with supper Current orders for Inpatient glycemic control:  Lantus 15 units BID, Novolog 10 units with breakfast, Novolog 5 units with lunch, Novolog 10 units with supper, Novolog 0-9 units TID with meals, Novolog 0-5 units QHS  Inpatient Diabetes Program Recommendations:    Insulin-Basal: Please consider decreasing Lantus to 10 units BID.  Insulin-Meal Coverage: Please discontinue all meal coverage currently ordered and order Novolog 4 units TID with meals for meal coverage if patient eats at least 50% of meals.  NOTE: Nursing please be sure patient eats at least 50% of meals before giving meal coverage insulin.  Thanks, Barnie Alderman, RN, MSN, CDE Diabetes Coordinator Inpatient Diabetes Program [phone] (Team Pager from 8am to 5pm)

## 2019-06-28 LAB — GI PATHOGEN PANEL BY PCR, STOOL

## 2019-06-28 LAB — CBC WITH DIFFERENTIAL/PLATELET
Abs Immature Granulocytes: 0.08 10*3/uL — ABNORMAL HIGH (ref 0.00–0.07)
Basophils Absolute: 0.1 10*3/uL (ref 0.0–0.1)
Basophils Relative: 0 %
Eosinophils Absolute: 0.3 10*3/uL (ref 0.0–0.5)
Eosinophils Relative: 1 %
HCT: 31.3 % — ABNORMAL LOW (ref 36.0–46.0)
Hemoglobin: 9.6 g/dL — ABNORMAL LOW (ref 12.0–15.0)
Immature Granulocytes: 0 %
Lymphocytes Relative: 81 %
Lymphs Abs: 23.7 10*3/uL — ABNORMAL HIGH (ref 0.7–4.0)
MCH: 26 pg (ref 26.0–34.0)
MCHC: 30.7 g/dL (ref 30.0–36.0)
MCV: 84.8 fL (ref 80.0–100.0)
Monocytes Absolute: 0.4 10*3/uL (ref 0.1–1.0)
Monocytes Relative: 1 %
Neutro Abs: 5.1 10*3/uL (ref 1.7–7.7)
Neutrophils Relative %: 17 %
Platelets: 206 10*3/uL (ref 150–400)
RBC: 3.69 MIL/uL — ABNORMAL LOW (ref 3.87–5.11)
RDW: 17.5 % — ABNORMAL HIGH (ref 11.5–15.5)
WBC Morphology: ABNORMAL
WBC: 29.7 10*3/uL — ABNORMAL HIGH (ref 4.0–10.5)
nRBC: 0 % (ref 0.0–0.2)

## 2019-06-28 LAB — GLUCOSE, CAPILLARY
Glucose-Capillary: 112 mg/dL — ABNORMAL HIGH (ref 70–99)
Glucose-Capillary: 83 mg/dL (ref 70–99)
Glucose-Capillary: 68 mg/dL — ABNORMAL LOW (ref 70–99)
Glucose-Capillary: 71 mg/dL (ref 70–99)
Glucose-Capillary: 93 mg/dL (ref 70–99)
Glucose-Capillary: 94 mg/dL (ref 70–99)

## 2019-06-28 LAB — BASIC METABOLIC PANEL
Anion gap: 18 — ABNORMAL HIGH (ref 5–15)
BUN: 47 mg/dL — ABNORMAL HIGH (ref 8–23)
CO2: 25 mmol/L (ref 22–32)
Calcium: 7.9 mg/dL — ABNORMAL LOW (ref 8.9–10.3)
Chloride: 93 mmol/L — ABNORMAL LOW (ref 98–111)
Creatinine, Ser: 7.53 mg/dL — ABNORMAL HIGH (ref 0.44–1.00)
GFR calc Af Amer: 6 mL/min — ABNORMAL LOW (ref >60)
GFR calc non Af Amer: 5 mL/min — ABNORMAL LOW (ref >60)
Glucose, Bld: 142 mg/dL — ABNORMAL HIGH (ref 70–99)
Potassium: 3.8 mmol/L (ref 3.5–5.1)
Sodium: 136 mmol/L (ref 135–145)

## 2019-06-28 MED ORDER — APIXABAN 5 MG PO TABS
5.0000 mg | ORAL_TABLET | Freq: Two times a day (BID) | ORAL | Status: DC
  Administered 2019-06-28 – 2019-07-02 (×7): 5 mg via ORAL
  Filled 2019-06-28 (×7): qty 1

## 2019-06-28 NOTE — Progress Notes (Signed)
[facility]  ROUNDING NOTE   Subjective:  Patient seen at bedside. Resting comfortably. Still having mild abdominal pain. Peritoneal fluid culture negative.  Objective:  Vital signs in last 24 hours:  Temp:  [97.6 F (36.4 C)-98 F (36.7 C)] 97.9 F (36.6 C) (10/17 0835) Pulse Rate:  [70-82] 82 (10/17 0835) Resp:  [18-20] 19 (10/17 0835) BP: (95-111)/(53-70) 100/55 (10/17 0835) SpO2:  [94 %-100 %] 100 % (10/17 0835) Weight:  [113.4 kg] 113.4 kg (10/17 0507)  Weight change: -2.767 kg Filed Weights   06/26/19 0539 06/27/19 0457 06/28/19 0507  Weight: 118.6 kg 116.2 kg 113.4 kg    Intake/Output: I/O last 3 completed shifts: In: -  Out: 1653 [Urine:170; ZRAQT:6226]   Intake/Output this shift:  Total I/O In: -  Out: 2365 [Other:2365]  Physical Exam: General: No acute distress  Head: Normocephalic, atraumatic. Moist oral mucosal membranes  Eyes: Anicteric  Neck: Supple, trachea midline  Lungs:  Basilar rales, normal effort  Heart: S1S2 irregular  Abdomen:  Soft, mild diffuse tenderness.  Extremities: Bilateral BKA, edema in both BKA stumps.  Neurologic: Awake, alert, following commands  Skin: No lesions  Access: PD catheter in place    Basic Metabolic Panel: Recent Labs  Lab 06/25/19 1128 06/26/19 0414 06/27/19 0840 06/28/19 0643  NA 135 133* 136 136  K 4.4 3.7 4.2 3.8  CL 91* 93* 94* 93*  CO2 24 25 25 25   GLUCOSE 183* 216* 164* 142*  BUN 57* 54* 52* 47*  CREATININE 8.20* 7.84* 7.66* 7.53*  CALCIUM 7.6* 7.5* 7.7* 7.9*  MG  --  1.9  --   --   PHOS  --  8.8*  --   --     Liver Function Tests: Recent Labs  Lab 06/25/19 1235  AST 24  ALT 19  ALKPHOS 74  BILITOT 0.7  PROT 5.3*  ALBUMIN 2.3*   No results for input(s): LIPASE, AMYLASE in the last 168 hours. No results for input(s): AMMONIA in the last 168 hours.  CBC: Recent Labs  Lab 06/25/19 1128 06/26/19 0414 06/26/19 1551 06/27/19 0840 06/28/19 0643  WBC 24.2* 35.7*  35.0* 35.0* 29.7*  NEUTROABS  --   --   --  6.1 5.1  HGB 11.2* 8.5* 9.3* 9.5* 9.6*  HCT 35.9* 27.8* 30.7* 30.6* 31.3*  MCV 84.7 85.3 86.2 85.5 84.8  PLT 151 180 195 191 206    Cardiac Enzymes: No results for input(s): CKTOTAL, CKMB, CKMBINDEX, TROPONINI in the last 168 hours.  BNP: Invalid input(s): POCBNP  CBG: Recent Labs  Lab 06/27/19 2051 06/28/19 0828 06/28/19 1008 06/28/19 1204 06/28/19 1226  GLUCAP 125* 112* 83 68* 71    Microbiology: Results for orders placed or performed during the hospital encounter of 06/25/19  SARS CORONAVIRUS 2 (TAT 6-24 HRS) Nasopharyngeal Nasopharyngeal Swab     Status: None   Collection Time: 06/25/19 11:27 AM   Specimen: Nasopharyngeal Swab  Result Value Ref Range Status   SARS Coronavirus 2 NEGATIVE NEGATIVE Final    Comment: (NOTE) SARS-CoV-2 target nucleic acids are NOT DETECTED. The SARS-CoV-2 RNA is generally detectable in upper and lower respiratory specimens during the acute phase of infection. Negative results do not preclude SARS-CoV-2 infection, do not rule out co-infections with other pathogens, and should not be used as the sole basis for treatment or other patient management decisions. Negative results must be combined with clinical observations, patient history, and epidemiological information. The expected result is Negative. Fact Sheet for Patients: SugarRoll.be  Fact Sheet for Healthcare Providers: https://www.woods-mathews.com/ This test is not yet approved or cleared by the [place] FDA and  has been authorized for detection and/or diagnosis of SARS-CoV-2 by FDA under an Emergency Use Authorization (EUA). This EUA will remain  in effect (meaning this test can be used) for the duration of the COVID-19 declaration under Section 56 4(b)(1) of the Act, 21 U.S.C. section 360bbb-3(b)(1), unless the authorization is terminated or revoked sooner. Performed at [facility], [place] [address], [place], [place] [address]   GI pathogen panel by PCR, stool     Status: None   Collection Time: 06/25/19  1:04 PM   Specimen: STOOL  Result Value Ref Range Status   Plesiomonas shigelloides NOT DETECTED NOT DETECTED Final   Yersinia enterocolitica NOT DETECTED NOT DETECTED Final   Vibrio NOT DETECTED NOT DETECTED Final   Enteropathogenic E coli NOT DETECTED NOT DETECTED Final   E coli (ETEC) LT/ST NOT DETECTED NOT DETECTED Final   E coli 6213 by PCR Not applicable NOT DETECTED Final   Cryptosporidium by PCR NOT DETECTED NOT DETECTED Final   Entamoeba histolytica NOT DETECTED NOT DETECTED Final   Adenovirus F 40/41 NOT DETECTED NOT DETECTED Final   Norovirus GI/GII NOT DETECTED NOT DETECTED Final   Sapovirus NOT DETECTED NOT DETECTED Final    Comment: (NOTE) Performed At: [facility] [place], [place] [id number] Rush Farmer MD [id number]    Vibrio cholerae NOT DETECTED NOT DETECTED Final   Campylobacter by PCR NOT DETECTED NOT DETECTED Final   Salmonella by PCR NOT DETECTED NOT DETECTED Final   E coli (STEC) NOT DETECTED NOT DETECTED Final   Enteroaggregative E coli NOT DETECTED NOT DETECTED Final   Shigella by PCR NOT DETECTED NOT DETECTED Final   Cyclospora cayetanensis NOT DETECTED NOT DETECTED Final   Astrovirus NOT DETECTED NOT DETECTED Final   G lamblia by PCR NOT DETECTED NOT DETECTED Final   Rotavirus A by PCR NOT DETECTED NOT DETECTED Final  C difficile quick scan w PCR reflex     Status: None   Collection Time: 06/25/19  2:55 PM   Specimen: STOOL  Result Value Ref Range Status   C Diff antigen NEGATIVE NEGATIVE Final   C Diff toxin NEGATIVE NEGATIVE Final   C Diff interpretation No C. difficile detected.  Final    Comment: Performed at [facility], [address] [place], [place] [address]  MRSA PCR Screening     Status: None   Collection Time: 06/26/19 10:37 PM   Specimen: Nasopharyngeal  Result  Value Ref Range Status   MRSA by PCR NEGATIVE NEGATIVE Final    Comment:        The GeneXpert MRSA Assay (FDA approved for NASAL specimens only), is one component of a comprehensive MRSA colonization surveillance program. It is not intended to diagnose MRSA infection nor to guide or monitor treatment for MRSA infections. Performed at [facility], [address] [place], [place] [address]   Body fluid culture     Status: None (Preliminary result)   Collection Time: 06/27/19 10:20 AM   Specimen: Peritoneal Washings; Body Fluid  Result Value Ref Range Status   Specimen Description   Final    PERITONEAL Performed at [facility], [address], [place], [place] [address]    Special Requests   Final    Normal Performed at [facility], [address] [place],  [address]  Gram Stain NO WBC SEEN NO ORGANISMS SEEN   Final   Culture   Final    NO GROWTH < 24 HOURS Performed at [facility], [place] [address] [place], [place] [address]    Report Status PENDING  Incomplete    Coagulation Studies: No results for input(s): LABPROT, INR in the last 72 hours.  Urinalysis: No results for input(s): COLORURINE, LABSPEC, PHURINE, GLUCOSEU, HGBUR, BILIRUBINUR, KETONESUR, PROTEINUR, UROBILINOGEN, NITRITE, LEUKOCYTESUR in the last 72 hours.  Invalid input(s): APPERANCEUR    Imaging: Nm Myocar Multi W/spect W/wall Motion / Ef  Result Date: 06/27/2019 Pharmacological myocardial perfusion imaging study with no significant Ischemia Large regions of old scar noted in the distal anteroseptal, apical, lateral and inferolateral wall. Diffuse hypokinesis,  EF estimated at 26% No EKG changes concerning for ischemia at peak stress or in recovery. High risk scan given severely depressed EF and regions of old infarct. Signed, Esmond Plants, MD, Ph.D [facility]     Medications:   . dialysis solution 2.5% low-MG/low-CA     . amitriptyline  25 mg  Oral QHS  . apixaban  5 mg Oral BID  . atorvastatin  40 mg Oral QHS  . Chlorhexidine Gluconate Cloth  6 each Topical Daily  . cholecalciferol  4,000 Units Oral Daily  . clopidogrel  75 mg Oral Daily  . extraneal (ICODEXTRIN) peritoneal dialysis solution   Intraperitoneal Q24H  . gabapentin  100 mg Oral BID  . gentamicin cream  1 application Topical Daily  . insulin aspart  0-5 Units Subcutaneous QHS  . insulin aspart  0-9 Units Subcutaneous TID WC  . insulin aspart  10 Units Subcutaneous QAC breakfast   And  . insulin aspart  5 Units Subcutaneous QAC lunch   And  . insulin aspart  10 Units Subcutaneous QAC supper  . insulin glargine  12 Units Subcutaneous BID  . iron polysaccharides  150 mg Oral Daily  . isosorbide mononitrate  30 mg Oral Daily  . multivitamin-lutein  1 capsule Oral BID  . pantoprazole  40 mg Oral QHS  . sertraline  100 mg Oral QHS  . sevelamer carbonate  2,400 mg Oral TID WC  . vitamin C  500 mg Oral Daily   acetaminophen, nitroGLYCERIN  Assessment/ Plan:  73 y.o. female with past medical history of ESRD on PD, bilateral BKA, coronary artery disease status post CABG, chronic systolic heart failure, CLL, diabetes mellitus type 2, hypertension, GERD, hyperlipidemia, ischemic cardiomyopathy, peripheral arterial disease, admitted with chest pain, nausea, and diarrhea.  [facility]/peritoneal dialysis  1.  ESRD on peritoneal dialysis.  Continue current peritoneal dialysis prescription.  No signs of peritonitis based upon PD fluid culture and cell count with differential.  2.  Chest pain.  Management as per hospitalist.  3.  Anemia of chronic any disease.  Hemoglobin currently 9.6.  Continue to monitor CBC periodically.  4.  Secondary hyperparathyroidism.  Continue Renvela 2400 mg p.o. 3 times daily with meals.   LOS: 2 Caroline Hernandez 10/17/20204:24 PM

## 2019-06-28 NOTE — Progress Notes (Signed)
Progress Note  Patient Name: Caroline Hernandez Date of Encounter: 06/28/2019  Primary Cardiologist: Ida Rogue, MD   Subjective   Feeling better this AM. No chest pain. Has had two bowel movements so far today, still loose. Still with swelling in bilateral stumps. Reviewed lexiscan results with her.  Inpatient Medications    Scheduled Meds:  amitriptyline  25 mg Oral QHS   apixaban  5 mg Oral BID   atorvastatin  40 mg Oral QHS   Chlorhexidine Gluconate Cloth  6 each Topical Daily   cholecalciferol  4,000 Units Oral Daily   clopidogrel  75 mg Oral Daily   extraneal (ICODEXTRIN) peritoneal dialysis solution   Intraperitoneal Q24H   gabapentin  100 mg Oral BID   gentamicin cream  1 application Topical Daily   insulin aspart  0-5 Units Subcutaneous QHS   insulin aspart  0-9 Units Subcutaneous TID WC   insulin aspart  10 Units Subcutaneous QAC breakfast   And   insulin aspart  5 Units Subcutaneous QAC lunch   And   insulin aspart  10 Units Subcutaneous QAC supper   insulin glargine  12 Units Subcutaneous BID   iron polysaccharides  150 mg Oral Daily   isosorbide mononitrate  30 mg Oral Daily   multivitamin-lutein  1 capsule Oral BID   pantoprazole  40 mg Oral QHS   sertraline  100 mg Oral QHS   sevelamer carbonate  2,400 mg Oral TID WC   vitamin C  500 mg Oral Daily   Continuous Infusions:  dialysis solution 2.5% low-MG/low-CA     PRN Meds: acetaminophen, nitroGLYCERIN   Vital Signs    Vitals:   06/27/19 1549 06/27/19 1942 06/28/19 0507 06/28/19 0835  BP: (!) 95/51 95/70 (!) 111/53 (!) 100/55  Pulse: 78 70 82 82  Resp: 18 18 20 19   Temp: 97.7 F (36.5 C) 97.6 F (36.4 C) 98 F (36.7 C) 97.9 F (36.6 C)  TempSrc:  Oral Oral Oral  SpO2: 99% 94% 99% 100%  Weight:   113.4 kg   Height:        Intake/Output Summary (Last 24 hours) at 06/28/2019 1028 Last data filed at 06/28/2019 0820 Gross per 24 hour  Intake --  Output 2435 ml  Net  -2435 ml   Last 3 Weights 06/28/2019 06/27/2019 06/26/2019  Weight (lbs) 250 lb 256 lb 1.6 oz 261 lb 7.5 oz  Weight (kg) 113.399 kg 116.166 kg 118.6 kg      Telemetry    Atrial fib - Personally Reviewed  ECG    Atrial fib, IVC 10/14 - Personally Reviewed  Physical Exam   GEN: No acute distress.   Neck: JVD difficult to visualize Cardiac: irregularly irregular S1 and S2, no murmurs, rubs, or gallops.  Respiratory: Clear to auscultation bilaterally. GI: Soft, nontender, non-distended  MS: bilateral BKA with 1+ stump edema Neuro:  Nonfocal  Psych: Normal affect   Labs    High Sensitivity Troponin:   Recent Labs  Lab 06/25/19 1128 06/25/19 1231 06/25/19 1455 06/25/19 1624  TROPONINIHS 21* 22* 22* 22*      Chemistry Recent Labs  Lab 06/25/19 1235 06/26/19 0414 06/27/19 0840 06/28/19 0643  NA  --  133* 136 136  K  --  3.7 4.2 3.8  CL  --  93* 94* 93*  CO2  --  25 25 25   GLUCOSE  --  216* 164* 142*  BUN  --  54* 52* 47*  CREATININE  --  7.84* 7.66* 7.53*  CALCIUM  --  7.5* 7.7* 7.9*  PROT 5.3*  --   --   --   ALBUMIN 2.3*  --   --   --   AST 24  --   --   --   ALT 19  --   --   --   ALKPHOS 74  --   --   --   BILITOT 0.7  --   --   --   GFRNONAA  --  5* 5* 5*  GFRAA  --  5* 6* 6*  ANIONGAP  --  15 17* 18*     Hematology Recent Labs  Lab 06/26/19 1551 06/27/19 0840 06/28/19 0643  WBC 35.0* 35.0* 29.7*  RBC 3.56* 3.58* 3.69*  HGB 9.3* 9.5* 9.6*  HCT 30.7* 30.6* 31.3*  MCV 86.2 85.5 84.8  MCH 26.1 26.5 26.0  MCHC 30.3 31.0 30.7  RDW 17.4* 17.3* 17.5*  PLT 195 191 206    BNPNo results for input(s): BNP, PROBNP in the last 168 hours.   DDimer No results for input(s): DDIMER in the last 168 hours.   Radiology    Nm Myocar Multi W/spect W/wall Motion / Ef  Result Date: 06/27/2019 Pharmacological myocardial perfusion imaging study with no significant Ischemia Large regions of old scar noted in the distal anteroseptal, apical, lateral and  inferolateral wall. Diffuse hypokinesis,  EF estimated at 26% No EKG changes concerning for ischemia at peak stress or in recovery. High risk scan given severely depressed EF and regions of old infarct. Signed, Esmond Plants, MD, Ph.D [facility]    Cardiac Studies   NM myoview 06/27/19 Pharmacological myocardial perfusion imaging study with no significant Ischemia Large regions of old scar noted in the distal anteroseptal, apical, lateral and inferolateral wall.  Diffuse hypokinesis,  EF estimated at 26% No EKG changes concerning for ischemia at peak stress or in recovery. High risk scan given severely depressed EF and regions of old infarct.  Echo 06/25/19 1. Left ventricular ejection fraction, by visual estimation, is 25 to 30%. The left ventricle has severely decreased function. Normal left ventricular size. There is mildly increased left ventricular hypertrophy.  2. Definity contrast agent was given IV to delineate the left ventricular endocardial borders.  3. Indeterminate diastolic filling due to E-A fusion pattern of LV diastolic filling.  4. Global right ventricle has mildly reduced systolic function.The right ventricular size is mildly enlarged. Right vetricular wall thickness was not assessed.  5. Left atrial size was moderately dilated.  6. Right atrial size was normal.  7. Moderate aortic valve annular calcification.  8. The mitral valve is grossly normal. Trace mitral valve regurgitation.  9. The tricuspid valve is grossly normal. Tricuspid valve regurgitation is mild. 10. Aortic valve mean gradient measures 3.0 mmHg. 11. Aortic valve peak gradient measures 5.4 mmHg. 12. The aortic valve is abnormal Aortic valve regurgitation was not visualized by color flow Doppler. 13. The pulmonic valve was not well visualized. Pulmonic valve regurgitation is trivial by color flow Doppler. 14. Moderately elevated pulmonary artery systolic pressure. 15. A pacer wire is  visualized.  Patient Profile     73 y.o. female with history of CAD with cardiac arrest s/p 3v CABG in 2004 (LIMA to LAD, SVG to OM, and SVG to RCA) s/p subsequent PCI to vein graft to OM in 11/2018, permanent Afib on Eliquis, Chronic systolic heart failure 2/2 ICM s/p ICD, pulmonary HTN, CLL, ESRD on PD, anemia of chronic dz,  PAD s/p b/l BKA, recurrent wound infections complicated by bacteremia / Fournier's gangrene, DM2, HTN, HLD, GERD, and obesity. She is being followed for evaluation of chest pain.  Assessment & Plan    Chest pain with known CAD  -hsTn not consistent with ACS. -planned for cath, but with acute Hgb drop 2/2 vaginal bleeding, this was cancelled -lexiscan yesterday with large scar and reduced EF, but no ischemia. No further workup at this time. -Dr. Rockey Situ changed amlodipine to imdur yesterday for angina. BP soft but holding steady -continue atorvastatin, clopidogrel  Acute on chronic systolic and diastolic heart failure 2/2 ischemic cardiomyopathy: -on dialysis for volume management -echo, lexiscan with reduced EF  Permanent atrial fibrillation: -rate controlled on no agents -this admission, had anemia, vaginal bleed -restarted apixaban yesterday, CBC stable to slightly improved  Hypotension: lower than her baseline but asymptomatic. Only on low dose imdur. Continue for now  PAD s/p bilateral BKA: no change today  [facility] will sign off.   Medication Recommendations:  Changing home amlodipine to imdur as ordered. Continue clodipogrel, atorvastatin. Discontinue home losartan. Other recommendations (labs, testing, etc):  none Follow up as an outpatient:  Has follow up with Christell Faith on 10/20  For questions or updates, please contact [place] Please consult [email or web address] for contact info under        Signed, Buford Dresser, MD  06/28/2019, 10:28 AM

## 2019-06-28 NOTE — Progress Notes (Addendum)
Hypoglycemic Event  CBG: 68  Treatment: 8 oz juice/soda  Symptoms: None  Follow-up CBG: Time: 12:26 CBG Result: 71  Possible Reasons for Event: Medication regimen: MD notified. Lantus held.     Caroline Hernandez

## 2019-06-28 NOTE — Progress Notes (Signed)
Pd started . Exit site care performed.

## 2019-06-28 NOTE — Plan of Care (Signed)
  Problem: Nutrition: Goal: Adequate nutrition will be maintained Outcome: Progressing   Problem: Elimination: Goal: Will not experience complications related to bowel motility Outcome: Progressing   Problem: Pain Managment: Goal: General experience of comfort will improve Outcome: Progressing   

## 2019-06-28 NOTE — Progress Notes (Signed)
ccpd competed

## 2019-06-28 NOTE — Plan of Care (Signed)
  Problem: Clinical Measurements: Goal: Cardiovascular complication will be avoided Outcome: Progressing   Problem: Elimination: Goal: Will not experience complications related to bowel motility Outcome: Progressing

## 2019-06-28 NOTE — Progress Notes (Signed)
[place] [facility] [place] NAME: Bernie Fobes    MR#:  [id number]  DATE OF BIRTH:  April 13, 1946  SUBJECTIVE:   No further diarrhea. Complains of abdominal pain however no fever, diarrhea or burning urination Does not make much urine  REVIEW OF SYSTEMS:    Review of Systems  Constitutional: Positive for malaise/fatigue. Negative for chills and fever.  HENT: Negative for sore throat.   Eyes: Negative for blurred vision, double vision and pain.  Respiratory: Negative for cough, hemoptysis, shortness of breath and wheezing.   Cardiovascular: Positive for chest pain. Negative for palpitations, orthopnea and leg swelling.  Gastrointestinal: Positive for diarrhea. Negative for abdominal pain, constipation, heartburn, nausea and vomiting.  Genitourinary: Negative for dysuria and hematuria.  Musculoskeletal: Negative for back pain and joint pain.  Skin: Negative for rash.  Neurological: Negative for sensory change, speech change, focal weakness and headaches.  Endo/Heme/Allergies: Does not bruise/bleed easily.  Psychiatric/Behavioral: Negative for depression. The patient is not nervous/anxious.    DRUG ALLERGIES:   Allergies  Allergen Reactions  . Piperacillin Other (See Comments)    Renal failure  . 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 See ID note**PER PT-CAUSED RENAL FAILURE**  . Zosyn [Piperacillin Sod-Tazobactam So] Other (See Comments)    Renal failure    VITALS:  Blood pressure (!) 100/55, pulse 82, temperature 97.9 F (36.6 C), temperature source Oral, resp. rate 19, height 5\' 8"  (1.727 m), weight 113.4 kg, SpO2 100 %.  PHYSICAL EXAMINATION:   Physical Exam  GENERAL:  73 y.o.-year-old patient lying in the bed with no acute distress.  EYES: Pupils equal, round, reactive to light and accommodation. No scleral icterus. Extraocular muscles intact.  HEENT: Head atraumatic, normocephalic. Oropharynx  and nasopharynx clear.  NECK:  Supple, no jugular venous distention. No thyroid enlargement, no tenderness.  LUNGS: Normal breath sounds bilaterally, no wheezing, rales, rhonchi. No use of accessory muscles of respiration.  CARDIOVASCULAR: S1, S2 normal. No murmurs, rubs, or gallops.  ABDOMEN: Soft, diffusely tender, nondistended. Bowel sounds present. No organomegaly or mass.  PD catheter in place EXTREMITIES: No cyanosis, clubbing or edema b/l.    NEUROLOGIC: Cranial nerves II through XII are intact. No focal Motor or sensory deficits b/l.   PSYCHIATRIC: The patient is alert and oriented x 3.  SKIN: No obvious rash, lesion, or ulcer.   LABORATORY PANEL:   CBC Recent Labs  Lab 06/28/19 0643  WBC 29.7*  HGB 9.6*  HCT 31.3*  PLT 206   ------------------------------------------------------------------------------------------------------------------ Chemistries  Recent Labs  Lab 06/25/19 1235 06/26/19 0414  06/28/19 0643  NA  --  133*   < > 136  K  --  3.7   < > 3.8  CL  --  93*   < > 93*  CO2  --  25   < > 25  GLUCOSE  --  216*   < > 142*  BUN  --  54*   < > 47*  CREATININE  --  7.84*   < > 7.53*  CALCIUM  --  7.5*   < > 7.9*  MG  --  1.9  --   --   AST 24  --   --   --   ALT 19  --   --   --   ALKPHOS 74  --   --   --   BILITOT 0.7  --   --   --    < > =  values in this interval not displayed.   ------------------------------------------------------------------------------------------------------------------  Cardiac Enzymes No results for input(s): TROPONINI in the last 168 hours. ------------------------------------------------------------------------------------------------------------------  RADIOLOGY:  Nm Myocar Multi W/spect W/wall Motion / Ef  Result Date: 06/27/2019 Pharmacological myocardial perfusion imaging study with no significant Ischemia Large regions of old scar noted in the distal anteroseptal, apical, lateral and inferolateral wall. Diffuse  hypokinesis,  EF estimated at 26% No EKG changes concerning for ischemia at peak stress or in recovery. High risk scan given severely depressed EF and regions of old infarct. Signed, Esmond Plants, MD, Ph.D [facility]   ASSESSMENT AND PLAN:   Patient is a 73 year old female with history of end-stage renal disease on peritoneal dialysis, chronic combined systolic and diastolic CHF, peripheral artery disease status post bilateral BKA, diabetes mellitus, CLL on chronic atrial fibrillation on anticoagulation with Eliquis who presented to the emergency room with complaints of chest pain, diarrhea and abdominal discomfort  * Chest pain Concerning for unstable angina Cardiology consulted Cath held due to drop in HB intially. Heparin stopped. Hb now stabilized Stool negative for blood Eliquis now resumed Stress test negative  *  Diarrhea and abdominal discomfort Ongoing for the last 2 days. Patient has chronic leukocytosis due to underlying CLL. C diff Negative Abd pain- Discussed with Dr. Holley Raring. No concern for SBP as PD fluid is clear.  *  Diabetes mellitus type 2 Resume home insulin Placed on sliding scale insulin coverage.  * End-stage renal disease on peritoneal dialysis Patient reports increasing weight gain Nephrology consulted  *  Chronic atrial fibrillation Rate controlled.  Continue home dose of Eliquis  *  Peripheral artery disease Patient status post bilateral BKA  * Anemia of chronic disease. Stable. Baseline 9.5-10  Overall feels so much better Pt wants to stay one more day and will d/c tomorrow if cont to feel better  All the records are reviewed and case discussed with Care Management/Social Worker  CODE STATUS: FULL CODE  DVT Prophylaxis: SCDs  TOTAL TIME TAKING CARE OF THIS PATIENT: 35 minutes.   POSSIBLE D/C IN 1-2 DAYS, DEPENDING ON CLINICAL CONDITION.  Fritzi Mandes M.D on 06/28/2019 at 3:03 PM  Between 7am to 6pm - Pager - [phone]  After  6pm go to [email or web address] - password [email or web address] [facility]  Office  [phone]  CC: Primary care physician; System, Pcp Not In  Note: This dictation was prepared with Dragon dictation along with smaller phrase technology. Any transcriptional errors that result from this process are unintentional.

## 2019-06-29 LAB — URINALYSIS, COMPLETE (UACMP) WITH MICROSCOPIC
Bilirubin Urine: NEGATIVE
Glucose, UA: NEGATIVE mg/dL
Ketones, ur: NEGATIVE mg/dL
Nitrite: NEGATIVE
Protein, ur: >=300 mg/dL — AB
Specific Gravity, Urine: 1.013 (ref 1.005–1.030)
Squamous Epithelial / HPF: NONE SEEN (ref 0–5)
WBC, UA: >50 WBC/hpf — ABNORMAL HIGH (ref 0–5)
pH: 7 (ref 5.0–8.0)

## 2019-06-29 LAB — GLUCOSE, CAPILLARY
Glucose-Capillary: 188 mg/dL — ABNORMAL HIGH (ref 70–99)
Glucose-Capillary: 97 mg/dL (ref 70–99)
Glucose-Capillary: 80 mg/dL (ref 70–99)
Glucose-Capillary: 161 mg/dL — ABNORMAL HIGH (ref 70–99)

## 2019-06-29 MED ORDER — ISOSORBIDE MONONITRATE ER 30 MG PO TB24: 30.0000 mg | ORAL_TABLET | Freq: Every day | ORAL | 1 refills | Status: AC

## 2019-06-29 MED ORDER — INSULIN GLARGINE 100 UNIT/ML ~~LOC~~ SOLN: 12.0000 [IU] | Freq: Every day | SUBCUTANEOUS | 1 refills | Status: AC

## 2019-06-29 MED ORDER — SEVELAMER CARBONATE 800 MG PO TABS: 2400.0000 mg | ORAL_TABLET | Freq: Three times a day (TID) | ORAL | 1 refills | Status: AC

## 2019-06-29 MED ORDER — INSULIN ASPART 100 UNIT/ML ~~LOC~~ SOLN: 4.0000 [IU] | Freq: Three times a day (TID) | SUBCUTANEOUS | 11 refills | Status: AC

## 2019-06-29 MED ORDER — INSULIN GLARGINE 100 UNIT/ML ~~LOC~~ SOLN
12.0000 [IU] | Freq: Every day | SUBCUTANEOUS | Status: DC
  Administered 2019-07-01 – 2019-07-02 (×2): 12 [IU] via SUBCUTANEOUS
  Filled 2019-06-29 (×4): qty 0.12

## 2019-06-29 MED ORDER — GABAPENTIN 100 MG PO CAPS: 100.0000 mg | ORAL_CAPSULE | Freq: Two times a day (BID) | ORAL | 0 refills | Status: AC

## 2019-06-29 MED ORDER — INSULIN ASPART 100 UNIT/ML ~~LOC~~ SOLN
4.0000 [IU] | Freq: Three times a day (TID) | SUBCUTANEOUS | Status: DC
  Administered 2019-06-29 – 2019-07-01 (×3): 4 [IU] via SUBCUTANEOUS
  Filled 2019-06-29 (×4): qty 1

## 2019-06-29 MED ORDER — NYSTATIN 100000 UNIT/GM EX POWD
Freq: Three times a day (TID) | CUTANEOUS | Status: DC
  Administered 2019-06-29 – 2019-07-02 (×9): via TOPICAL
  Filled 2019-06-29: qty 15

## 2019-06-29 MED ORDER — NYSTATIN 100000 UNIT/GM EX POWD: Freq: Three times a day (TID) | CUTANEOUS | 0 refills | Status: AC

## 2019-06-29 NOTE — Discharge Summary (Addendum)
[facility] NAME: Caroline Hernandez    MR#:  [id number]  DATE OF BIRTH:  Jun 23, 1946  DATE OF ADMISSION:  06/25/2019 ADMITTING PHYSICIAN: Jude Ojie, MD  DATE OF DISCHARGE: 06/29/2019  PRIMARY CARE PHYSICIAN: System, Pcp Not In    ADMISSION DIAGNOSIS:  End stage renal disease on dialysis ([place]) [N18.6, Z99.2] Nonspecific chest pain [R07.9]  DISCHARGE DIAGNOSIS:  Chest pain--resolved ESRD on PD DM-2 Chronic afib on oral eliquis Anemia of chronic disease  SECONDARY DIAGNOSIS:   Past Medical History:  Diagnosis Date  . Amputation of left lower extremity below knee ([place])   . Amputation of right lower extremity below knee ([place])   . CAD (coronary artery disease)    a. 2004 Cardiac Arrest/CABG x 3 (LIMA->LAD, VG->OM, VG->RCA);  b. 06/2015 lexiscan MV: no significant ischemia, EF 48%, low risk->Med Rx.  . Chronic combined systolic and diastolic CHF (congestive heart failure) ([place])    a. 12/2014 Echo: EF 45-50%;  b. 08/2015 Echo: EF 45-50%, ant, antsept HK, mildly dil LA, nl RV, mild-mod TR, sev PAH (46mmHg); b. 08/2017 TEE: EF 40-45%, large PFO w/ L->R shunting.  . CKD (chronic kidney disease), stage IV ([place])   . CLL (chronic lymphocytic leukemia) ([place])   . Diabetes mellitus without complication ([place])   . Essential hypertension   . GERD (gastroesophageal reflux disease)   . Hiatal hernia   . History of cardiac arrest    a. 2004.  Marland Kitchen Hyperlipidemia   . Ischemic cardiomyopathy    a. s/p MDT ICD (originally had 4627 lead-->gen change and lead revision ~ 2012 @ [place]); b. 12/2014 Echo: EF 45-50%;  c.08/2015 Echo: EF 45-50%; d. 08/2017 Echo: EF 40-45%.  Marland Kitchen PAD (peripheral artery disease) ([facility])    a. s/p bilat BKA  . Persistent atrial fibrillation ([place])    a. Dx 12/2014.  CHA2DS2VASc = 6--> warfarin;  b. 09/2015 s/p DCCV-->on amio; c. s/p DCCV 04/25/16; d. 07/2016 s/p RFCA/PVI in setting of recurrent afib despite amio; e. 05/2018 Recurrent  afib.    HOSPITAL COURSE:   Patient is a 73 year old female with history of end-stage renal disease on peritoneal dialysis,chronic combined systolic and diastolic CHF,peripheral artery disease status post bilateral BKA,diabetes mellitus,CLL on chronic atrial fibrillation on anticoagulation with Eliquis who presented to the emergency room with complaints of chest pain,diarrhea and abdominal discomfort  *Chest pain Concerning for unstable angina Cardiology consulted with dr Rockey Situ Cath held due to drop in HB intially. Heparin stopped. Hb now stabilized Stool negative for blood Eliquis now resumed Stress test negative with EF 26 % (cardiomyopathy) per cardiolog-- Imdur added, cont plavix and atorvastatin. D/c amlodipine and losartan  *Diarrhea and abdominal discomfort Ongoing for the last 2 days. Patient has chronic leukocytosis due to underlying CLL. C diff Negative Abd pain- Discussed with Dr. Holley Raring. No concern for SBP as PD fluid is clear.  *Diabetes mellitus type 2 Home insulin dosing changed due to sugars very well controlled and want to avoid hypoglycemia Placed on sliding scale insulin coverage.  *End-stage renal disease on peritoneal dialysis Patient reports increasing weight gain Nephrology consulted  *chronic systolic CHF ef 26 % on Myoview -Cont Diuretics and pt currently appears euvolemic. Mild edema on the amputation stump  *Chronic atrial fibrillation Rate controlled. Continue home dose of Eliquis  *Peripheral artery disease Patient status post bilateral BKA  * Anemia of chronic disease. Stable. Baseline 9.5-10  Overall feels so much better today. Ok to go  home tdoay with [facility]ter [facility] arranged Palliative care to follow after discharge. CONSULTS OBTAINED:    DRUG ALLERGIES:   Allergies  Allergen Reactions  . Piperacillin Other (See Comments)    Renal failure  . Piperacillin-Tazobactam In Dex Other (See Comments)    Possible AIN in  2008??? See ID note**PER PT-CAUSED RENAL FAILURE**  . Zosyn [Piperacillin Sod-Tazobactam So] Other (See Comments)    Renal failure    DISCHARGE MEDICATIONS:   Allergies as of 06/29/2019      Reactions   Piperacillin Other (See Comments)   Renal failure   Piperacillin-tazobactam In Dex Other (See Comments)   Possible AIN in 2008??? See ID note**PER PT-CAUSED RENAL FAILURE**   Zosyn [piperacillin Sod-tazobactam So] Other (See Comments)   Renal failure      Medication List    STOP taking these medications   cephALEXin 250 MG capsule Commonly known as: KEFLEX   losartan 100 MG tablet Commonly known as: COZAAR     TAKE these medications   amitriptyline 25 MG tablet Commonly known as: ELAVIL Take 25 mg by mouth at bedtime.   amLODipine 10 MG tablet Commonly known as: NORVASC TAKE 1 TABLET BY MOUTH DAILY   apixaban 5 MG Tabs tablet Commonly known as: Eliquis Take 1 tablet (5 mg total) by mouth 2 (two) times daily.   atorvastatin 40 MG tablet Commonly known as: LIPITOR Take 40 mg by mouth at bedtime.   clopidogrel 75 MG tablet Commonly known as: PLAVIX Take 1 tablet (75 mg) by mouth once daily   gabapentin 100 MG capsule Commonly known as: NEURONTIN Take 1 capsule (100 mg total) by mouth 2 (two) times daily. What changed: when to take this   HM Vitamin D3 100 MCG (4000 UT) Caps Generic drug: Cholecalciferol Take 4,000 Units by mouth daily.   insulin aspart 100 UNIT/ML injection Commonly known as: novoLOG Inject 4 Units into the skin 3 (three) times daily with meals. 10 units into the skin before breakfast then 5 units before lunch then 10 units before supper (evening meal) What changed:   how much to take  when to take this   insulin glargine 100 UNIT/ML injection Commonly known as: LANTUS Inject 0.12 mLs (12 Units total) into the skin daily. What changed:   how much to take  when to take this   iron polysaccharides 150 MG capsule Commonly known as:  NIFEREX Take 150 mg by mouth daily.   isosorbide mononitrate 30 MG 24 hr tablet Commonly known as: IMDUR Take 1 tablet (30 mg total) by mouth daily. Start taking on: June 30, 2019   nitroGLYCERIN 0.4 MG SL tablet Commonly known as: NITROSTAT Place 1 tablet (0.4 mg total) under the tongue every 5 (five) minutes x 3 doses as needed for chest pain.   nystatin powder Commonly known as: MYCOSTATIN/NYSTOP Apply topically 3 (three) times daily.   pantoprazole 40 MG tablet Commonly known as: PROTONIX Take 40 mg by mouth at bedtime.   sertraline 100 MG tablet Commonly known as: ZOLOFT Take 100 mg by mouth at bedtime.   sevelamer carbonate 800 MG tablet Commonly known as: RENVELA Take 3 tablets (2,400 mg total) by mouth 3 (three) times daily with meals.   torsemide 20 MG tablet Commonly known as: DEMADEX TAKE 2 TABLETS(40 MG) BY MOUTH TWICE DAILY What changed: See the new instructions.   vitamin C 500 MG tablet Commonly known as: ASCORBIC ACID Take 500 mg by mouth daily.   Viteyes AREDS Formula/Lutein Caps  Take 1 capsule by mouth 2 (two) times daily.       If you experience worsening of your admission symptoms, develop shortness of breath, life threatening emergency, suicidal or homicidal thoughts you must seek medical attention immediately by calling 911 or calling your MD immediately  if symptoms less severe.  You Must read complete instructions/literature along with all the possible adverse reactions/side effects for all the Medicines you take and that have been prescribed to you. Take any new Medicines after you have completely understood and accept all the possible adverse reactions/side effects.   Please note  You were cared for by a hospitalist during your hospital stay. If you have any questions about your discharge medications or the care you received while you were in the hospital after you are discharged, you can call the unit and asked to speak with the  hospitalist on call if the hospitalist that took care of you is not available. Once you are discharged, your primary care physician will handle any further medical issues. Please note that NO REFILLS for any discharge medications will be authorized once you are discharged, as it is imperative that you return to your primary care physician (or establish a relationship with a primary care physician if you do not have one) for your aftercare needs so that they can reassess your need for medications and monitor your lab values. Today   SUBJECTIVE   No dysuria, urgency or fever tolerating po diet  VITAL SIGNS:  Blood pressure (!) 102/57, pulse 69, temperature 98 F (36.7 C), temperature source Oral, resp. rate 16, height 5\' 8"  (1.727 m), weight 115.2 kg, SpO2 98 %.  I/O:    Intake/Output Summary (Last 24 hours) at 06/29/2019 1139 Last data filed at 06/29/2019 0458 Gross per 24 hour  Intake 240 ml  Output 160 ml  Net 80 ml    PHYSICAL EXAMINATION:  GENERAL:  73 y.o.-year-old patient lying in the bed with no acute distress.  EYES: Pupils equal, round, reactive to light and accommodation. No scleral icterus. Extraocular muscles intact.  HEENT: Head atraumatic, normocephalic. Oropharynx and nasopharynx clear.  NECK:  Supple, no jugular venous distention. No thyroid enlargement, no tenderness.  LUNGS: Normal breath sounds bilaterally, no wheezing, rales,rhonchi or crepitation. No use of accessory muscles of respiration.  CARDIOVASCULAR: S1, S2 normal. No murmurs, rubs, or gallops.  ABDOMEN: Soft, non-tender, non-distended. Bowel sounds present. No organomegaly or mass.  EXTREMITIES: bilateral BKA stump ok NEUROLOGIC: Cranial nerves II through XII are intact. Muscle strength 5/5 in all extremities.  Gait not checked.  PSYCHIATRIC: The patient is alert and oriented x 3.  SKIN: No obvious rash, lesion, or ulcer. Per RN --rash in the perineal area  DATA REVIEW:   CBC  Recent Labs  Lab  06/28/19 0643  WBC 29.7*  HGB 9.6*  HCT 31.3*  PLT 206    Chemistries  Recent Labs  Lab 06/25/19 1235 06/26/19 0414  06/28/19 0643  NA  --  133*   < > 136  K  --  3.7   < > 3.8  CL  --  93*   < > 93*  CO2  --  25   < > 25  GLUCOSE  --  216*   < > 142*  BUN  --  54*   < > 47*  CREATININE  --  7.84*   < > 7.53*  CALCIUM  --  7.5*   < > 7.9*  MG  --  1.9  --   --   AST 24  --   --   --   ALT 19  --   --   --   ALKPHOS 74  --   --   --   BILITOT 0.7  --   --   --    < > = values in this interval not displayed.    Microbiology Results   Recent Results (from the past 240 hour(s))  SARS CORONAVIRUS 2 (TAT 6-24 HRS) Nasopharyngeal Nasopharyngeal Swab     Status: None   Collection Time: 06/25/19 11:27 AM   Specimen: Nasopharyngeal Swab  Result Value Ref Range Status   SARS Coronavirus 2 NEGATIVE NEGATIVE Final    Comment: (NOTE) SARS-CoV-2 target nucleic acids are NOT DETECTED. The SARS-CoV-2 RNA is generally detectable in upper and lower respiratory specimens during the acute phase of infection. Negative results do not preclude SARS-CoV-2 infection, do not rule out co-infections with other pathogens, and should not be used as the sole basis for treatment or other patient management decisions. Negative results must be combined with clinical observations, patient history, and epidemiological information. The expected result is Negative. Fact Sheet for Patients: SugarRoll.be Fact Sheet for Healthcare Providers: https://www.woods-mathews.com/ This test is not yet approved or cleared by the [place] FDA and  has been authorized for detection and/or diagnosis of SARS-CoV-2 by FDA under an Emergency Use Authorization (EUA). This EUA will remain  in effect (meaning this test can be used) for the duration of the COVID-19 declaration under Section 56 4(b)(1) of the Act, 21 U.S.C. section 360bbb-3(b)(1), unless the authorization is  terminated or revoked sooner. Performed at [facility], [place] [address] [place], [place] [address]   GI pathogen panel by PCR, stool     Status: None   Collection Time: 06/25/19  1:04 PM   Specimen: STOOL  Result Value Ref Range Status   Plesiomonas shigelloides NOT DETECTED NOT DETECTED Final   Yersinia enterocolitica NOT DETECTED NOT DETECTED Final   Vibrio NOT DETECTED NOT DETECTED Final   Enteropathogenic E coli NOT DETECTED NOT DETECTED Final   E coli (ETEC) LT/ST NOT DETECTED NOT DETECTED Final   E coli 2831 by PCR Not applicable NOT DETECTED Final   Cryptosporidium by PCR NOT DETECTED NOT DETECTED Final   Entamoeba histolytica NOT DETECTED NOT DETECTED Final   Adenovirus F 40/41 NOT DETECTED NOT DETECTED Final   Norovirus GI/GII NOT DETECTED NOT DETECTED Final   Sapovirus NOT DETECTED NOT DETECTED Final    Comment: (NOTE) Performed At: [facility] [place], [place] [id number] Rush Farmer MD XT:[id number]    Vibrio cholerae NOT DETECTED NOT DETECTED Final   Campylobacter by PCR NOT DETECTED NOT DETECTED Final   Salmonella by PCR NOT DETECTED NOT DETECTED Final   E coli (STEC) NOT DETECTED NOT DETECTED Final   Enteroaggregative E coli NOT DETECTED NOT DETECTED Final   Shigella by PCR NOT DETECTED NOT DETECTED Final   Cyclospora cayetanensis NOT DETECTED NOT DETECTED Final   Astrovirus NOT DETECTED NOT DETECTED Final   G lamblia by PCR NOT DETECTED NOT DETECTED Final   Rotavirus A by PCR NOT DETECTED NOT DETECTED Final  C difficile quick scan w PCR reflex     Status: None   Collection Time: 06/25/19  2:55 PM   Specimen: STOOL  Result Value Ref Range Status   C Diff antigen NEGATIVE NEGATIVE Final   C Diff toxin NEGATIVE NEGATIVE  Final   C Diff interpretation No C. difficile detected.  Final    Comment: Performed at [facility], [address] [place], [place] [address]  MRSA PCR Screening     Status: None   Collection Time:  06/26/19 10:37 PM   Specimen: Nasopharyngeal  Result Value Ref Range Status   MRSA by PCR NEGATIVE NEGATIVE Final    Comment:        The GeneXpert MRSA Assay (FDA approved for NASAL specimens only), is one component of a comprehensive MRSA colonization surveillance program. It is not intended to diagnose MRSA infection nor to guide or monitor treatment for MRSA infections. Performed at [facility], [address] [place], [place] [address]   Body fluid culture     Status: None (Preliminary result)   Collection Time: 06/27/19 10:20 AM   Specimen: Peritoneal Washings; Body Fluid  Result Value Ref Range Status   Specimen Description   Final    PERITONEAL Performed at [facility], [address] [place], [place] [address]    Special Requests   Final    Normal Performed at [facility], [place], [place] [address]    Gram Stain NO WBC SEEN NO ORGANISMS SEEN   Final   Culture   Final    NO GROWTH 2 DAYS Performed at [facility], [place] [address], [place], [place] [address]    Report Status PENDING  Incomplete    RADIOLOGY:  Nm Myocar Multi W/spect W/wall Motion / Ef  Result Date: 06/27/2019 Pharmacological myocardial perfusion imaging study with no significant Ischemia Large regions of old scar noted in the distal anteroseptal, apical, lateral and inferolateral wall. Diffuse hypokinesis,  EF estimated at 26% No EKG changes concerning for ischemia at peak stress or in recovery. High risk scan given severely depressed EF and regions of old infarct. Signed, Esmond Plants, MD, Ph.D [facility]     CODE STATUS:     Code Status Orders  (From admission, onward)         Start     Ordered   06/25/19 1257  Full code  Continuous     06/25/19 1257        Code Status History    Date Active Date Inactive Code Status Order ID Comments User Context   02/16/2019 0003 02/18/2019 1843 Full Code [id number]  Mansy, Arvella Merles, MD ED    11/19/2018 1242 11/23/2018 2255 Full Code [id number]  Gladstone Lighter, MD Inpatient   11/18/2018 2203 11/19/2018 1242 DNR [id number]  Demetrios Loll, MD ED   10/16/2018 0650 10/31/2018 1826 Full Code [id number]  Sedalia Muta, MD ED   04/14/2018 1701 04/19/2018 1446 Full Code [id number]  Salary, Avel Peace, MD Inpatient   03/07/2018 1058 03/10/2018 1548 DNR [id number]  [name], MD ED   07/18/2016 1718 07/19/2016 1717 Full Code [id number]  Thompson Grayer, MD Inpatient   06/12/2015 1810 06/14/2015 1928 Full Code [id number]  Loletha Grayer, MD ED   Advance Care Planning Activity    Advance Directive Documentation     Most Recent Value  Type of Advance Directive  Healthcare Power of Attorney  Pre-existing out of facility DNR order (yellow form or pink MOST form)  -  "MOST" Form in Place?  -      TOTAL TIME TAKING CARE OF THIS PATIENT: *40* minutes.    Fritzi Mandes M.D on 06/29/2019 at 11:39 AM  Between 7am to 6pm - Pager - [phone] After  6pm go to [email or web address] - password [email or web address] [facility]  Office  [phone]  CC: Primary care physician; System, Pcp Not In

## 2019-06-29 NOTE — Progress Notes (Signed)
Physical Therapy Evaluation Patient Details Name: Caroline Hernandez MRN: [id number] DOB: [birth year] Today's Date: 06/29/2019   History of Present Illness  Caroline Hernandez  is a 73 y.o. female with a known history of end-stage renal disease on peritoneal dialysis, chronic combined systolic and diastolic CHF, peripheral artery disease status post bilateral BKA, diabetes mellitus, CLL on chronic atrial fibrillation on anticoagulation with Eliquis who presented to the emergency room with complaints of chest pain  Clinical Impression  Patient has no reports of pain. She attempts to roll left and right and needs total assist, she attempts supine to sit and needs the [facility] elevated and is not able to scoot fwd to edge of bed with max assist x 1. She needs total assist max assist x 2 to be able to scoot to the edge of the bed. She is not able to transfer without total assist due to weakness and is not able to ambulate for the same reasons. She has -3/5  strength in BLE stumps. She will benefit from skilled PT to improve mobility and strength .     Follow Up Recommendations SNF    Equipment Recommendations       Recommendations for Other Services       Precautions / Restrictions Restrictions Weight Bearing Restrictions: No      Mobility  Bed Mobility Overal bed mobility: Needs Assistance Bed Mobility: Rolling;Supine to Sit;Sit to Supine Rolling: Total assist   Supine to sit: Total assist Sit to supine: Total assist   General bed mobility comments: Patient needs HOB elevated full and still not able to sit up straight due to weakness in trunk   Transfers Overall transfer level: (dependent due to swelling and weakness in limbs)                  Ambulation/Gait Ambulation/Gait assistance: (unable)              Stairs            Wheelchair Mobility    Modified Rankin (Stroke Patients Only)       Balance                                              Pertinent Vitals/Pain Pain Assessment: No/denies pain    Home Living Family/patient expects to be discharged to:: Skilled nursing facility Living Arrangements: Children                    Prior Function Level of Independence: Needs assistance   Gait / Transfers Assistance Needed: (non-ambulatory, transfered with supervision)           Hand Dominance        Extremity/Trunk Assessment   Upper Extremity Assessment Upper Extremity Assessment: Generalized weakness    Lower Extremity Assessment Lower Extremity Assessment: Generalized weakness       Communication   Communication: No difficulties  Cognition Arousal/Alertness: Awake/alert Behavior During Therapy: WFL for tasks assessed/performed Overall Cognitive Status: Within Functional Limits for tasks assessed                                        General Comments      Exercises     Assessment/Plan    PT Assessment    PT Problem List  PT Treatment Interventions      PT Goals (Current goals can be found in the Care Plan section)  Acute Rehab PT Goals Patient Stated Goal: to be able to transfer  PT Goal Formulation: With patient Time For Goal Achievement: 07/13/19 Potential to Achieve Goals: Fair    Frequency     Barriers to discharge        Co-evaluation               AM-PAC PT "6 Clicks" Mobility  Outcome Measure Help needed turning from your back to your side while in a flat bed without using bedrails?: Total Help needed moving from lying on your back to sitting on the side of a flat bed without using bedrails?: Total Help needed moving to and from a bed to a chair (including a wheelchair)?: Total Help needed standing up from a chair using your arms (e.g., wheelchair or bedside chair)?: Total Help needed to walk in hospital room?: Total Help needed climbing 3-5 steps with a railing? : Total 6 Click Score: 6    End of Session               Time: 6381-7711 PT Time Calculation (min) (ACUTE ONLY): 25 min   Charges:   PT Evaluation $PT Eval Low Complexity: 1 Low           Alanson Puls, PTDPT 06/29/2019, 1:36 PM

## 2019-06-29 NOTE — NC FL2 (Signed)
[place] LEVEL OF CARE SCREENING TOOL     IDENTIFICATION  Patient Name: Caroline Hernandez Birthdate: 07-19-1946 Sex: female Admission Date (Current Location): 06/25/2019  [place] and [place] Number:  Engineering geologist and Address:  [facility], [address] [place], [place] [address]      Provider Number: [id number]  Attending Physician Name and Address:  Fritzi Mandes, MD  Relative Name and Phone Number:       Current Level of Care: SNF Recommended Level of Care: [place] Prior Approval Number:    Date Approved/Denied:   PASRR Number: [id number]  Discharge Plan: SNF    Current Diagnoses: Patient Active Problem List   Diagnosis Date Noted  . Dilated cardiomyopathy ([place]) 06/27/2019  . Unstable angina ([place]) 06/26/2019  . NSTEMI (non-ST elevated myocardial infarction) ([place]) 11/18/2018  . CLL (chronic lymphocytic leukemia) ([place]) 10/23/2018  . Pressure injury of skin 10/19/2018  . CHF (congestive heart failure) ([place]) 10/16/2018  . CAP (community acquired pneumonia) 04/14/2018  . UTI (urinary tract infection) 03/07/2018  . Atrial fibrillation with slow ventricular response ([place]) 07/18/2016  . Chronic diastolic CHF (congestive heart failure) ([place])   . SOB (shortness of breath)   . Acute on chronic systolic heart failure ([place]) 08/20/2015  . CKD (chronic kidney disease), stage IV ([place])   . Ischemic cardiomyopathy   . Chronic combined systolic and diastolic CHF (congestive heart failure) ([place])   . Essential hypertension 06/18/2015  . Systolic and diastolic CHF, acute on chronic ([place])   . Chronic renal failure syndrome, stage 5 (HCC)   . Angina pectoris ([place])   . Coronary artery disease involving native coronary artery of native heart with angina pectoris with documented spasm ([place])   . 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 ([place])   . Persistent atrial fibrillation ([place])   . Bradycardia   .  Chest pain 06/12/2015    Orientation RESPIRATION BLADDER Height & Weight     Self, Time, Situation, Place  O2(2 liters oxygen.) Incontinent Weight: 254 lb (115.2 kg) Height:  5\' 8"  (172.7 cm)  BEHAVIORAL SYMPTOMS/MOOD NEUROLOGICAL BOWEL NUTRITION STATUS      Continent Diet(Diet: Renal/ carb modified.)  AMBULATORY STATUS COMMUNICATION OF NEEDS Skin   Total Care Verbally PU Stage and Appropriate Care(Pressure Ulcer Stage 2 Buttocks. Bilateral BKA.)                       Personal Care Assistance Level of Assistance  Bathing, Feeding, Dressing Bathing Assistance: Maximum assistance Feeding assistance: Limited assistance Dressing Assistance: Maximum assistance     Functional Limitations Info  Sight, Hearing, Speech Sight Info: Impaired Hearing Info: Adequate Speech Info: Adequate    SPECIAL CARE FACTORS FREQUENCY  PT (By licensed PT), OT (By licensed OT)(Hemodialysis.)     PT Frequency: 5 OT Frequency: 5            Contractures      Additional Factors Info  Code Status, Allergies Code Status Info: Full Code. Allergies Info: Piperacillin, Piperacillin-tazobactam In Dex, Zosyn piperacillin Sod-tazobactam So.           Current Medications (06/29/2019):  This is the current hospital active medication list Current Facility-Administered Medications  Medication Dose Route Frequency Provider Last Rate Last Dose  . acetaminophen (TYLENOL) tablet 650 mg  650 mg Oral Q6H PRN Lance Coon, MD   650 mg at 06/29/19 1059  . amitriptyline (ELAVIL) tablet 25 mg  25 mg  Oral QHS Stark Jock, Jude, MD   25 mg at 06/28/19 2249  . apixaban (ELIQUIS) tablet 5 mg  5 mg Oral BID Fritzi Mandes, MD   5 mg at 06/29/19 0786  . atorvastatin (LIPITOR) tablet 40 mg  40 mg Oral QHS Ojie, Jude, MD   40 mg at 06/28/19 2249  . Chlorhexidine Gluconate Cloth 2 % PADS 6 each  6 each Topical Daily Stark Jock, Jude, MD   6 each at 06/29/19 0837  . cholecalciferol (VITAMIN D3) tablet 4,000 Units  4,000 Units Oral  Daily Stark Jock, Jude, MD   4,000 Units at 06/29/19 0834  . clopidogrel (PLAVIX) tablet 75 mg  75 mg Oral Daily Ojie, Jude, MD   75 mg at 06/29/19 0833  . dialysis solution 2.5% low-MG/low-CA dianeal solution   Intraperitoneal Q24H Holley Raring, Munsoor, MD   13 L at 06/28/19 2337  . extraneal (ICODEXTRIN) peritoneal dialysis solution   Intraperitoneal Q24H Lateef, Munsoor, MD      . gabapentin (NEURONTIN) capsule 100 mg  100 mg Oral BID Stark Jock, Jude, MD   100 mg at 06/29/19 0833  . gentamicin cream (GARAMYCIN) 0.1 % 1 application  1 application Topical Daily Holley Raring, Munsoor, MD   1 application at 75/44/92 0837  . insulin aspart (novoLOG) injection 0-5 Units  0-5 Units Subcutaneous QHS Ojie, Jude, MD      . insulin aspart (novoLOG) injection 0-9 Units  0-9 Units Subcutaneous TID WC Stark Jock, Jude, MD   2 Units at 06/27/19 0100  . insulin aspart (novoLOG) injection 4 Units  4 Units Subcutaneous TID WC Fritzi Mandes, MD   4 Units at 06/29/19 1230  . [START ON 06/30/2019] insulin glargine (LANTUS) injection 12 Units  12 Units Subcutaneous Daily Fritzi Mandes, MD      . iron polysaccharides (NIFEREX) capsule 150 mg  150 mg Oral Daily Ojie, Jude, MD   150 mg at 06/29/19 0834  . isosorbide mononitrate (IMDUR) 24 hr tablet 30 mg  30 mg Oral Daily Minna Merritts, MD   30 mg at 06/29/19 7121  . multivitamin-lutein (OCUVITE-LUTEIN) capsule 1 capsule  1 capsule Oral BID Stark Jock, Jude, MD   1 capsule at 06/29/19 0834  . nitroGLYCERIN (NITROSTAT) SL tablet 0.4 mg  0.4 mg Sublingual Q5 min PRN Minna Merritts, MD      . nystatin (MYCOSTATIN/NYSTOP) topical powder   Topical TID Fritzi Mandes, MD      . pantoprazole (PROTONIX) EC tablet 40 mg  40 mg Oral QHS Ojie, Jude, MD   40 mg at 06/28/19 2249  . sertraline (ZOLOFT) tablet 100 mg  100 mg Oral QHS Ojie, Jude, MD   100 mg at 06/28/19 2249  . sevelamer carbonate (RENVELA) tablet 2,400 mg  2,400 mg Oral TID WC Lateef, Munsoor, MD   2,400 mg at 06/29/19 1231  . vitamin C (ASCORBIC  ACID) tablet 500 mg  500 mg Oral Daily Stark Jock, Jude, MD   500 mg at 06/29/19 9758     Discharge Medications: Please see discharge summary for a list of discharge medications.  Relevant Imaging Results:  Relevant Lab Results:   Additional Information SSN: [id number]  Caroline Hernandez, Caroline Hernandez, Caroline Hernandez

## 2019-06-29 NOTE — Plan of Care (Signed)
  Problem: Clinical Measurements: Goal: Ability to maintain clinical measurements within normal limits will improve Outcome: Progressing Goal: Respiratory complications will improve Outcome: Progressing Goal: Cardiovascular complication will be avoided Outcome: Progressing   Problem: Activity: Goal: Risk for activity intolerance will decrease Outcome: Progressing   Problem: Pain Managment: Goal: General experience of comfort will improve Outcome: Progressing   Problem: Safety: Goal: Ability to remain free from injury will improve Outcome: Progressing   Problem: Skin Integrity: Goal: Risk for impaired skin integrity will decrease Outcome: Progressing   

## 2019-06-29 NOTE — Progress Notes (Signed)
Patient ID: Caroline Hernandez, female   DOB: 04/16/1946, 73 y.o.   MRN: [id number] dter feels pt will not do well with HH at home. Wants rehab. Spoke with CSW who will d/w dter--pt will need to change to HD. Await dter's answer

## 2019-06-29 NOTE — TOC Progression Note (Addendum)
Transition of Care [facility]) - Progression Note    Patient Details  Name: Caroline Hernandez MRN: [id number] Date of Birth: [birth year]  Transition of [facility]) CM/SW Contact  Muneer Leider, Lenice Llamas Phone Number: [phone]  06/29/2019, 3:16 PM  Clinical Narrative: MD put in discharge order for patient today. Clinical Education officer, museum (CSW) attempted to call patient's daughter [name] however she did not answer and a voicemail was left. CSW met with patient alone at bedside. Patient reported that her daughter will be at [facility] in a few hours. Once patient's daughter arrived to [facility] she told RN that patient is too weak to go home and she wants her to go to a SNF.   CSW met with patient for a second time and her daughter [name] was at bedside. Per [name] patient can't walk right now and needs SNF. CSW explained that PT can be ordered to determine if SNF is appropriate. PT is recommending SNF. CSW explained to patient and daughter that Mcarthur Rossetti will have to approve SNF and patient will have to change from peritoneal dialysis to hemodialysis. CSW explained that his could take several days. Patient and daughter verbalized their understanding and want to move forward with SNF placement. Patient and daughter are agreeable to SNF search in [place]. Patient and daughter prefer [facility] in [place]. [facility] complete and faxed out. MD and RN aware of above. Discharge order cancelled for today. CSW will continue to follow and assist as needed.     Expected Discharge Plan: [place] Barriers to Discharge: Continued Medical Work up  Expected Discharge Plan and Services Expected Discharge Plan: [place]   Discharge Planning Services: CM Consult Post Acute Care Choice: [place] arrangements for the past 2 months: Single Family Home Expected Discharge Date: 06/29/19                         HH Arranged: RN, PT, Nurse's Aide, Social Work, OT [place] Agency:  [place] Date [place]: 06/27/19 Time [place]: 1021 Representative spoke with at [place]: [place] ([place]) Interventions    Readmission Risk Interventions Readmission Risk Prevention Plan 06/27/2019 06/27/2019 02/18/2019  Transportation Screening - Complete Complete  Medication Review Press photographer) - Complete -  PCP or Specialist appointment within 3-5 days of discharge - Complete Complete  HRI or [place] - Complete (No Data)  SW Recovery Care/Counseling Consult - Complete (No Data)  Palliative Care Screening - Complete Not [place] Not Applicable Not Complete Not Applicable  SNF Comments - pending PT eval -  Some recent data might be hidden

## 2019-06-29 NOTE — Progress Notes (Signed)
[facility]  ROUNDING NOTE   Subjective:  Patient continues to tolerate peritoneal dialysis well. It is felt that patient will require rehabilitation in a facility. Patient is willing to be placed but understands that she would need to be switched over to hemodialysis.  Objective:  Vital signs in last 24 hours:  Temp:  [97.5 F (36.4 C)-98.4 F (36.9 C)] 98 F (36.7 C) (10/18 0747) Pulse Rate:  [69-85] 69 (10/18 0747) Resp:  [16-20] 16 (10/18 0747) BP: (85-118)/(55-61) 102/57 (10/18 0747) SpO2:  [97 %-99 %] 98 % (10/18 0747) Weight:  [115.2 kg] 115.2 kg (10/18 0456)  Weight change: 1.814 kg Filed Weights   06/27/19 0457 06/28/19 0507 06/29/19 0456  Weight: 116.2 kg 113.4 kg 115.2 kg    Intake/Output: I/O last 3 completed shifts: In: 240 [P.O.:240] Out: 2595 [Urine:230; Other:2365]   Intake/Output this shift:  No intake/output data recorded.  Physical Exam: General: No acute distress  Head: Normocephalic, atraumatic. Moist oral mucosal membranes  Eyes: Anicteric  Neck: Supple, trachea midline  Lungs:  Basilar rales, normal effort  Heart: S1S2 irregular  Abdomen:  Soft, mild diffuse tenderness.  Extremities: Bilateral BKA, edema in both BKA stumps.  Neurologic: Awake, alert, following commands  Skin: No lesions  Access: PD catheter in place    Basic Metabolic Panel: Recent Labs  Lab 06/25/19 1128 06/26/19 0414 06/27/19 0840 06/28/19 0643  NA 135 133* 136 136  K 4.4 3.7 4.2 3.8  CL 91* 93* 94* 93*  CO2 24 25 25 25   GLUCOSE 183* 216* 164* 142*  BUN 57* 54* 52* 47*  CREATININE 8.20* 7.84* 7.66* 7.53*  CALCIUM 7.6* 7.5* 7.7* 7.9*  MG  --  1.9  --   --   PHOS  --  8.8*  --   --     Liver Function Tests: Recent Labs  Lab 06/25/19 1235  AST 24  ALT 19  ALKPHOS 74  BILITOT 0.7  PROT 5.3*  ALBUMIN 2.3*   No results for input(s): LIPASE, AMYLASE in the last 168 hours. No results for input(s): AMMONIA in the last 168  hours.  CBC: Recent Labs  Lab 06/25/19 1128 06/26/19 0414 06/26/19 1551 06/27/19 0840 06/28/19 0643  WBC 24.2* 35.7* 35.0* 35.0* 29.7*  NEUTROABS  --   --   --  6.1 5.1  HGB 11.2* 8.5* 9.3* 9.5* 9.6*  HCT 35.9* 27.8* 30.7* 30.6* 31.3*  MCV 84.7 85.3 86.2 85.5 84.8  PLT 151 180 195 191 206    Cardiac Enzymes: No results for input(s): CKTOTAL, CKMB, CKMBINDEX, TROPONINI in the last 168 hours.  BNP: Invalid input(s): POCBNP  CBG: Recent Labs  Lab 06/28/19 1226 06/28/19 1747 06/28/19 2050 06/29/19 0746 06/29/19 1220  GLUCAP 71 93 94 188* 97    Microbiology: Results for orders placed or performed during the hospital encounter of 06/25/19  SARS CORONAVIRUS 2 (TAT 6-24 HRS) Nasopharyngeal Nasopharyngeal Swab     Status: None   Collection Time: 06/25/19 11:27 AM   Specimen: Nasopharyngeal Swab  Result Value Ref Range Status   SARS Coronavirus 2 NEGATIVE NEGATIVE Final    Comment: (NOTE) SARS-CoV-2 target nucleic acids are NOT DETECTED. The SARS-CoV-2 RNA is generally detectable in upper and lower respiratory specimens during the acute phase of infection. Negative results do not preclude SARS-CoV-2 infection, do not rule out co-infections with other pathogens, and should not be used as the sole basis for treatment or other patient management decisions. Negative results must be combined with  clinical observations, patient history, and epidemiological information. The expected result is Negative. Fact Sheet for Patients: SugarRoll.be Fact Sheet for Healthcare Providers: https://www.woods-mathews.com/ This test is not yet approved or cleared by the [place] FDA and  has been authorized for detection and/or diagnosis of SARS-CoV-2 by FDA under an Emergency Use Authorization (EUA). This EUA will remain  in effect (meaning this test can be used) for the duration of the COVID-19 declaration under Section 56 4(b)(1) of the Act,  21 U.S.C. section 360bbb-3(b)(1), unless the authorization is terminated or revoked sooner. Performed at [facility], [place] [address], [place], [place] [address]   GI pathogen panel by PCR, stool     Status: None   Collection Time: 06/25/19  1:04 PM   Specimen: STOOL  Result Value Ref Range Status   Plesiomonas shigelloides NOT DETECTED NOT DETECTED Final   Yersinia enterocolitica NOT DETECTED NOT DETECTED Final   Vibrio NOT DETECTED NOT DETECTED Final   Enteropathogenic E coli NOT DETECTED NOT DETECTED Final   E coli (ETEC) LT/ST NOT DETECTED NOT DETECTED Final   E coli 9702 by PCR Not applicable NOT DETECTED Final   Cryptosporidium by PCR NOT DETECTED NOT DETECTED Final   Entamoeba histolytica NOT DETECTED NOT DETECTED Final   Adenovirus F 40/41 NOT DETECTED NOT DETECTED Final   Norovirus GI/GII NOT DETECTED NOT DETECTED Final   Sapovirus NOT DETECTED NOT DETECTED Final    Comment: (NOTE) Performed At: [facility] [place], [place] [id number] Rush Farmer MD YD:[id number]    Vibrio cholerae NOT DETECTED NOT DETECTED Final   Campylobacter by PCR NOT DETECTED NOT DETECTED Final   Salmonella by PCR NOT DETECTED NOT DETECTED Final   E coli (STEC) NOT DETECTED NOT DETECTED Final   Enteroaggregative E coli NOT DETECTED NOT DETECTED Final   Shigella by PCR NOT DETECTED NOT DETECTED Final   Cyclospora cayetanensis NOT DETECTED NOT DETECTED Final   Astrovirus NOT DETECTED NOT DETECTED Final   G lamblia by PCR NOT DETECTED NOT DETECTED Final   Rotavirus A by PCR NOT DETECTED NOT DETECTED Final  C difficile quick scan w PCR reflex     Status: None   Collection Time: 06/25/19  2:55 PM   Specimen: STOOL  Result Value Ref Range Status   C Diff antigen NEGATIVE NEGATIVE Final   C Diff toxin NEGATIVE NEGATIVE Final   C Diff interpretation No C. difficile detected.  Final    Comment: Performed at [facility], [address] [place], [place]  [address]  MRSA PCR Screening     Status: None   Collection Time: 06/26/19 10:37 PM   Specimen: Nasopharyngeal  Result Value Ref Range Status   MRSA by PCR NEGATIVE NEGATIVE Final    Comment:        The GeneXpert MRSA Assay (FDA approved for NASAL specimens only), is one component of a comprehensive MRSA colonization surveillance program. It is not intended to diagnose MRSA infection nor to guide or monitor treatment for MRSA infections. Performed at [facility], [address] [place], [place] [address]   Body fluid culture     Status: None (Preliminary result)   Collection Time: 06/27/19 10:20 AM   Specimen: Peritoneal Washings; Body Fluid  Result Value Ref Range Status   Specimen Description   Final    PERITONEAL Performed at [facility], [address] [place], [place] [address]    Special Requests   Final  Normal Performed at [facility], [place], [place] [address]    Gram Stain NO WBC SEEN NO ORGANISMS SEEN   Final   Culture   Final    NO GROWTH 2 DAYS Performed at [facility], [place] [address], [place], [place] [address]    Report Status PENDING  Incomplete    Coagulation Studies: No results for input(s): LABPROT, INR in the last 72 hours.  Urinalysis: Recent Labs    06/28/19 2354  COLORURINE YELLOW*  LABSPEC 1.013  PHURINE 7.0  GLUCOSEU NEGATIVE  HGBUR SMALL*  BILIRUBINUR NEGATIVE  KETONESUR NEGATIVE  PROTEINUR >=300*  NITRITE NEGATIVE  LEUKOCYTESUR MODERATE*      Imaging: No results found.   Medications:   . dialysis solution 2.5% low-MG/low-CA     . amitriptyline  25 mg Oral QHS  . apixaban  5 mg Oral BID  . atorvastatin  40 mg Oral QHS  . Chlorhexidine Gluconate Cloth  6 each Topical Daily  . cholecalciferol  4,000 Units Oral Daily  . clopidogrel  75 mg Oral Daily  . extraneal (ICODEXTRIN) peritoneal dialysis solution   Intraperitoneal Q24H  . gabapentin  100 mg Oral BID  .  gentamicin cream  1 application Topical Daily  . insulin aspart  0-5 Units Subcutaneous QHS  . insulin aspart  0-9 Units Subcutaneous TID WC  . insulin aspart  4 Units Subcutaneous TID WC  . [START ON 06/30/2019] insulin glargine  12 Units Subcutaneous Daily  . iron polysaccharides  150 mg Oral Daily  . isosorbide mononitrate  30 mg Oral Daily  . multivitamin-lutein  1 capsule Oral BID  . nystatin   Topical TID  . pantoprazole  40 mg Oral QHS  . sertraline  100 mg Oral QHS  . sevelamer carbonate  2,400 mg Oral TID WC  . vitamin C  500 mg Oral Daily   acetaminophen, nitroGLYCERIN  Assessment/ Plan:  73 y.o. female with past medical history of ESRD on PD, bilateral BKA, coronary artery disease status post CABG, chronic systolic heart failure, CLL, diabetes mellitus type 2, hypertension, GERD, hyperlipidemia, ischemic cardiomyopathy, peripheral arterial disease, admitted with chest pain, nausea, and diarrhea.  [facility]/peritoneal dialysis  1.  ESRD on peritoneal dialysis.  We will maintain the patient on peritoneal dialysis for tonight.  However it appears that she will require placement in rehab therefore will need to be switched over temporarily to hemodialysis.  She would like to continue to go to [facility] for hemodialysis as well.  We will request PermCath placement tomorrow.  2.  Chest pain.  Management as per hospitalist.  3.  Anemia of chronic any disease.  Patient will likely start on Epogen with hemodialysis.  4.  Secondary hyperparathyroidism.  Continue Renvela 2400 mg p.o. 3 times daily with meals.  Check serum phosphorus tomorrow.   LOS: 3 Daejah Klebba 10/18/20202:36 PM

## 2019-06-29 NOTE — Progress Notes (Signed)
Patients daughter at bedside and stated she is uncomfortable taking her mom home at this time due to mobility issues. Notified MD. Social worker, Mel Almond aware and PT eval reordered.

## 2019-06-30 ENCOUNTER — Other Ambulatory Visit (INDEPENDENT_AMBULATORY_CARE_PROVIDER_SITE_OTHER): Payer: Self-pay | Admitting: Vascular Surgery

## 2019-06-30 ENCOUNTER — Encounter
Admission: EM | Disposition: A | Discharge status: Skilled Nursing Facility | Payer: Self-pay | Source: Home / Self Care | Attending: Internal Medicine

## 2019-06-30 DIAGNOSIS — N185 Chronic kidney disease, stage 5: Secondary | ICD-10-CM

## 2019-06-30 HISTORY — PX: DIALYSIS/PERMA CATHETER INSERTION: CATH118288

## 2019-06-30 LAB — BASIC METABOLIC PANEL
Anion gap: 17 — ABNORMAL HIGH (ref 5–15)
BUN: 47 mg/dL — ABNORMAL HIGH (ref 8–23)
CO2: 21 mmol/L — ABNORMAL LOW (ref 22–32)
Calcium: 7.8 mg/dL — ABNORMAL LOW (ref 8.9–10.3)
Chloride: 95 mmol/L — ABNORMAL LOW (ref 98–111)
Creatinine, Ser: 8.06 mg/dL — ABNORMAL HIGH (ref 0.44–1.00)
GFR calc Af Amer: 5 mL/min — ABNORMAL LOW (ref >60)
GFR calc non Af Amer: 5 mL/min — ABNORMAL LOW (ref >60)
Glucose, Bld: 169 mg/dL — ABNORMAL HIGH (ref 70–99)
Potassium: 4 mmol/L (ref 3.5–5.1)
Sodium: 133 mmol/L — ABNORMAL LOW (ref 135–145)

## 2019-06-30 LAB — HEPATITIS B SURFACE ANTIBODY,QUALITATIVE: Hep B S Ab: NONREACTIVE

## 2019-06-30 LAB — GLUCOSE, CAPILLARY
Glucose-Capillary: 167 mg/dL — ABNORMAL HIGH (ref 70–99)
Glucose-Capillary: 142 mg/dL — ABNORMAL HIGH (ref 70–99)
Glucose-Capillary: 126 mg/dL — ABNORMAL HIGH (ref 70–99)
Glucose-Capillary: 139 mg/dL — ABNORMAL HIGH (ref 70–99)

## 2019-06-30 LAB — BODY FLUID CULTURE
Culture: NO GROWTH
Gram Stain: NONE SEEN
Special Requests: NORMAL

## 2019-06-30 LAB — HEPATITIS B SURFACE ANTIGEN: Hepatitis B Surface Ag: NONREACTIVE

## 2019-06-30 LAB — OVA + PARASITE EXAM

## 2019-06-30 LAB — HEPATITIS C ANTIBODY: HCV Ab: NONREACTIVE

## 2019-06-30 LAB — HEPATITIS B CORE ANTIBODY, TOTAL: Hep B Core Total Ab: NONREACTIVE

## 2019-06-30 LAB — O&P RESULT

## 2019-06-30 LAB — PATHOLOGIST SMEAR REVIEW

## 2019-06-30 SURGERY — DIALYSIS/PERMA CATHETER INSERTION
Anesthesia: Moderate Sedation

## 2019-06-30 MED ORDER — ONDANSETRON HCL 4 MG/2ML IJ SOLN: 4.0000 mg | Freq: Four times a day (QID) | INTRAMUSCULAR | Status: DC | PRN

## 2019-06-30 MED ORDER — CLINDAMYCIN PHOSPHATE 300 MG/50ML IV SOLN
INTRAVENOUS | Status: AC
  Administered 2019-07-01: 300 mg via INTRAVENOUS
  Filled 2019-06-30: qty 50

## 2019-06-30 MED ORDER — FENTANYL CITRATE (PF) 100 MCG/2ML IJ SOLN
INTRAMUSCULAR | Status: DC | PRN
  Administered 2019-06-30: 50 ug via INTRAVENOUS

## 2019-06-30 MED ORDER — HYDROMORPHONE HCL 1 MG/ML IJ SOLN
1.0000 mg | Freq: Once | INTRAMUSCULAR | Status: AC | PRN
  Administered 2019-07-01: 1 mg via INTRAVENOUS
  Filled 2019-06-30: qty 1

## 2019-06-30 MED ORDER — FENTANYL CITRATE (PF) 100 MCG/2ML IJ SOLN
INTRAMUSCULAR | Status: AC
  Filled 2019-06-30: qty 2

## 2019-06-30 MED ORDER — MIDAZOLAM HCL 2 MG/ML PO SYRP
8.0000 mg | ORAL_SOLUTION | Freq: Once | ORAL | Status: DC | PRN
  Filled 2019-06-30: qty 4

## 2019-06-30 MED ORDER — HEPARIN SODIUM (PORCINE) 10000 UNIT/ML IJ SOLN
INTRAMUSCULAR | Status: AC
  Filled 2019-06-30: qty 1

## 2019-06-30 MED ORDER — CLINDAMYCIN PHOSPHATE 300 MG/50ML IV SOLN
300.0000 mg | Freq: Once | INTRAVENOUS | Status: AC
  Administered 2019-06-30 – 2019-07-01 (×2): 300 mg via INTRAVENOUS
  Filled 2019-06-30: qty 50

## 2019-06-30 MED ORDER — FAMOTIDINE 20 MG PO TABS: 40.0000 mg | ORAL_TABLET | Freq: Once | ORAL | Status: DC | PRN

## 2019-06-30 MED ORDER — IODIXANOL 320 MG/ML IV SOLN
INTRAVENOUS | Status: DC | PRN
  Administered 2019-06-30: 14:00:00 10 mL via INTRAVENOUS

## 2019-06-30 MED ORDER — MIDAZOLAM HCL 5 MG/5ML IJ SOLN
INTRAMUSCULAR | Status: AC
  Filled 2019-06-30: qty 5

## 2019-06-30 MED ORDER — MIDAZOLAM HCL 2 MG/2ML IJ SOLN
INTRAMUSCULAR | Status: DC | PRN
  Administered 2019-06-30: 2 mg via INTRAVENOUS

## 2019-06-30 MED ORDER — DIPHENHYDRAMINE HCL 50 MG/ML IJ SOLN: 50.0000 mg | Freq: Once | INTRAMUSCULAR | Status: DC | PRN

## 2019-06-30 MED ORDER — SODIUM CHLORIDE 0.9 % IV SOLN
INTRAVENOUS | Status: DC
  Administered 2019-06-30: 13:00:00 via INTRAVENOUS

## 2019-06-30 MED ORDER — EPOETIN ALFA 10000 UNIT/ML IJ SOLN
10000.0000 [IU] | INTRAMUSCULAR | Status: DC
  Administered 2019-06-30: 10000 [IU] via INTRAVENOUS

## 2019-06-30 MED ORDER — METHYLPREDNISOLONE SODIUM SUCC 125 MG IJ SOLR: 125.0000 mg | Freq: Once | INTRAMUSCULAR | Status: DC | PRN

## 2019-06-30 SURGICAL SUPPLY — 7 items
CANNULA 5F STIFF (CANNULA) ×3 IMPLANT
CATH CANNON HEMO 15FR 19 (HEMODIALYSIS SUPPLIES) ×3 IMPLANT
DERMABOND ADVANCED (GAUZE/BANDAGES/DRESSINGS) ×2
DERMABOND ADVANCED .7 DNX12 (GAUZE/BANDAGES/DRESSINGS) ×1 IMPLANT
PACK ANGIOGRAPHY (CUSTOM PROCEDURE TRAY) ×3 IMPLANT
SUT MNCRL AB 4-0 PS2 18 (SUTURE) ×3 IMPLANT
SUT PROLENE 0 CT 1 30 (SUTURE) ×3 IMPLANT

## 2019-06-30 NOTE — Progress Notes (Signed)
Post hd tx assessment   06/30/19 2000  Neurological  Level of Consciousness Alert  Orientation Level Oriented X4  Respiratory  Respiratory Pattern Regular;Unlabored  Chest Assessment Chest expansion symmetrical  Bilateral Breath Sounds Clear;Diminished  Cardiac  Pulse Irregular  Jugular Venous Distention (JVD) No  ECG Monitor Yes  Cardiac Rhythm Atrial fibrillation  Vascular  R Radial Pulse +2  L Radial Pulse +2  Integumentary  Integumentary (WDL) X  Skin Color Appropriate for ethnicity  Musculoskeletal  Musculoskeletal (WDL) X  Generalized Weakness Yes  Gastrointestinal  Bowel Sounds Assessment Active  GU Assessment  Genitourinary (WDL) X  Genitourinary Symptoms Oliguria

## 2019-06-30 NOTE — Plan of Care (Signed)
  Problem: Clinical Measurements: Goal: Ability to maintain clinical measurements within normal limits will improve Outcome: Progressing   Problem: Clinical Measurements: Goal: Will remain free from infection Outcome: Progressing   Problem: Clinical Measurements: Goal: Diagnostic test results will improve Outcome: Progressing   

## 2019-06-30 NOTE — Progress Notes (Signed)
Aware that this patient will need to transition to hemodialysis for SNF placement, starting referral.

## 2019-06-30 NOTE — Progress Notes (Signed)
HD Tx completed   06/30/19 1955  Vital Signs  Pulse Rate 79  Resp 19  BP 101/60  Oxygen Therapy  SpO2 100 %  O2 Device Nasal Cannula  O2 Flow Rate (L/min) 2 L/min  Pain Assessment  Pain Scale 0-10  Pain Score 0  During Hemodialysis Assessment  Blood Flow Rate (mL/min) 300 mL/min  Arterial Pressure (mmHg) -140 mmHg  Venous Pressure (mmHg) 170 mmHg  Transmembrane Pressure (mmHg) 70 mmHg  Ultrafiltration Rate (mL/min) 580 mL/min  Dialysate Flow Rate (mL/min) 800 ml/min  Conductivity: Machine  14  HD Safety Checks Performed Yes  Intra-Hemodialysis Comments Tx completed

## 2019-06-30 NOTE — Consult Note (Signed)
[facility] Vascular Consult Note  MRN : [id number]  Caroline Hernandez is a 73 y.o. (1946-04-18) female who presents with chief complaint of  Chief Complaint  Patient presents with  . Chest Pain   History of Present Illness:  The patient is a 73 year old female with multiple medical issues (see below) who presented to the [facility] emergency department on June 25, 2019 with a chief complaint of chest pain.  The patient has a known history of end-stage renal disease and is maintained on peritoneal dialysis at home.  Patient notes no issues with the functioning of her peritoneal dialysis catheter.  The patient is a bilateral amputee who depends on family members for help and unfortunately her daughter has been unable to assist in her care due to depression.  Upon arrival to the emergency department, due to the state of her appearance/condition a referral to Adult Protective Services were made.  Patient's chest pain has resolved at this time.  Denies any fever nausea vomiting.   Due to unsafe conditions at home, inability to care for herself and per recommendations of physical therapy patient will be discharged to SNF.  Unfortunately, the patient is unable to dialyze via her peritoneal dialysis catheter at a SNF.  Vascular surgery was consulted by Dr. Holley Raring in regard to placing a PermCath so that she may be switched over to hemodialysis.  Current Facility-Administered Medications  Medication Dose Route Frequency Provider Last Rate Last Dose  . acetaminophen (TYLENOL) tablet 650 mg  650 mg Oral Q6H PRN Lance Coon, MD   650 mg at 06/29/19 2114  . amitriptyline (ELAVIL) tablet 25 mg  25 mg Oral QHS Ojie, Jude, MD   25 mg at 06/29/19 2114  . apixaban (ELIQUIS) tablet 5 mg  5 mg Oral BID Fritzi Mandes, MD   5 mg at 06/29/19 2113  . atorvastatin (LIPITOR) tablet 40 mg  40 mg Oral QHS Ojie, Jude, MD   40 mg at 06/29/19 2113  . Chlorhexidine Gluconate  Cloth 2 % PADS 6 each  6 each Topical Daily Stark Jock, Jude, MD   6 each at 06/29/19 0837  . cholecalciferol (VITAMIN D3) tablet 4,000 Units  4,000 Units Oral Daily Stark Jock, Jude, MD   4,000 Units at 06/29/19 0834  . clopidogrel (PLAVIX) tablet 75 mg  75 mg Oral Daily Ojie, Jude, MD   75 mg at 06/29/19 0833  . dialysis solution 2.5% low-MG/low-CA dianeal solution   Intraperitoneal Q24H Holley Raring, Munsoor, MD   13 L at 06/28/19 2337  . extraneal (ICODEXTRIN) peritoneal dialysis solution   Intraperitoneal Q24H Lateef, Munsoor, MD      . gabapentin (NEURONTIN) capsule 100 mg  100 mg Oral BID Stark Jock, Jude, MD   100 mg at 06/29/19 2113  . gentamicin cream (GARAMYCIN) 0.1 % 1 application  1 application Topical Daily Lateef, Munsoor, MD   1 application at 01/75/10 1038  . insulin aspart (novoLOG) injection 0-5 Units  0-5 Units Subcutaneous QHS Ojie, Jude, MD      . insulin aspart (novoLOG) injection 0-9 Units  0-9 Units Subcutaneous TID WC Stark Jock, Jude, MD   2 Units at 06/27/19 2585  . insulin aspart (novoLOG) injection 4 Units  4 Units Subcutaneous TID WC Fritzi Mandes, MD   4 Units at 06/29/19 1230  . insulin glargine (LANTUS) injection 12 Units  12 Units Subcutaneous Daily Fritzi Mandes, MD      . iron polysaccharides (NIFEREX) capsule 150 mg  150 mg Oral  Daily Stark Jock, Jude, MD   150 mg at 06/29/19 0834  . isosorbide mononitrate (IMDUR) 24 hr tablet 30 mg  30 mg Oral Daily Minna Merritts, MD   30 mg at 06/29/19 2683  . multivitamin-lutein (OCUVITE-LUTEIN) capsule 1 capsule  1 capsule Oral BID Stark Jock, Jude, MD   1 capsule at 06/29/19 2120  . nitroGLYCERIN (NITROSTAT) SL tablet 0.4 mg  0.4 mg Sublingual Q5 min PRN Minna Merritts, MD      . nystatin (MYCOSTATIN/NYSTOP) topical powder   Topical TID Fritzi Mandes, MD      . pantoprazole (PROTONIX) EC tablet 40 mg  40 mg Oral QHS Ojie, Jude, MD   40 mg at 06/29/19 2114  . sertraline (ZOLOFT) tablet 100 mg  100 mg Oral QHS Ojie, Jude, MD   100 mg at 06/29/19 2113  . sevelamer  carbonate (RENVELA) tablet 2,400 mg  2,400 mg Oral TID WC Lateef, Munsoor, MD   2,400 mg at 06/29/19 1654  . vitamin C (ASCORBIC ACID) tablet 500 mg  500 mg Oral Daily Stark Jock, Jude, MD   500 mg at 06/29/19 4196   Past Medical History:  Diagnosis Date  . Amputation of left lower extremity below knee ([place])   . Amputation of right lower extremity below knee ([place])   . CAD (coronary artery disease)    a. 2004 Cardiac Arrest/CABG x 3 (LIMA->LAD, VG->OM, VG->RCA);  b. 06/2015 lexiscan MV: no significant ischemia, EF 48%, low risk->Med Rx.  . Chronic combined systolic and diastolic CHF (congestive heart failure) ([place])    a. 12/2014 Echo: EF 45-50%;  b. 08/2015 Echo: EF 45-50%, ant, antsept HK, mildly dil LA, nl RV, mild-mod TR, sev PAH (97mmHg); b. 08/2017 TEE: EF 40-45%, large PFO w/ L->R shunting.  . CKD (chronic kidney disease), stage IV ([place])   . CLL (chronic lymphocytic leukemia) ([place])   . Diabetes mellitus without complication ([place])   . Essential hypertension   . GERD (gastroesophageal reflux disease)   . Hiatal hernia   . History of cardiac arrest    a. 2004.  Marland Kitchen Hyperlipidemia   . Ischemic cardiomyopathy    a. s/p MDT ICD (originally had 2229 lead-->gen change and lead revision ~ 2012 @ [place]); b. 12/2014 Echo: EF 45-50%;  c.08/2015 Echo: EF 45-50%; d. 08/2017 Echo: EF 40-45%.  Marland Kitchen PAD (peripheral artery disease) ([facility])    a. s/p bilat BKA  . Persistent atrial fibrillation ([place])    a. Dx 12/2014.  CHA2DS2VASc = 6--> warfarin;  b. 09/2015 s/p DCCV-->on amio; c. s/p DCCV 04/25/16; d. 07/2016 s/p RFCA/PVI in setting of recurrent afib despite amio; e. 05/2018 Recurrent afib.   Past Surgical History:  Procedure Laterality Date  . ABDOMINAL HYSTERECTOMY    . Amputation lower extremity bilaterally Bilateral   . CAPD INSERTION N/A 10/25/2018   Procedure: LAPAROSCOPIC INSERTION CONTINUOUS AMBULATORY PERITONEAL DIALYSIS  (CAPD) CATHETER;  Surgeon: Katha Cabal, MD;  Location: [facility] ORS;  Service:  Vascular;  Laterality: N/A;  . CARDIAC CATHETERIZATION    . CARDIOVERSION N/A 10/09/2016   Procedure: CARDIOVERSION;  Surgeon: Lelon Perla, MD;  Location: [facility] ENDOSCOPY;  Service: Cardiovascular;  Laterality: N/A;  . CORONARY ARTERY BYPASS GRAFT    . CORONARY STENT INTERVENTION N/A 11/20/2018   Procedure: CORONARY STENT INTERVENTION;  Surgeon: Isaias Cowman, MD;  Location: [place] CV LAB;  Service: Cardiovascular;  Laterality: N/A;  SVG- OM  . DIALYSIS/PERMA CATHETER INSERTION N/A 10/22/2018   Procedure: DIALYSIS/PERMA CATHETER INSERTION;  Surgeon:  Schnier, Dolores Lory, MD;  Location: [place] CV LAB;  Service: Cardiovascular;  Laterality: N/A;  . DIALYSIS/PERMA CATHETER REMOVAL N/A 01/06/2019   Procedure: DIALYSIS/PERMA CATHETER REMOVAL;  Surgeon: Algernon Huxley, MD;  Location: [place] CV LAB;  Service: Cardiovascular;  Laterality: N/A;  . ELECTROPHYSIOLOGIC STUDY N/A 10/07/2015   Procedure: CARDIOVERSION;  Surgeon: Minna Merritts, MD;  Location: [facility] ORS;  Service: Cardiovascular;  Laterality: N/A;  . ELECTROPHYSIOLOGIC STUDY N/A 04/25/2016   Procedure: Cardioversion;  Surgeon: Minna Merritts, MD;  Location: [facility] ORS;  Service: Cardiovascular;  Laterality: N/A;  . ELECTROPHYSIOLOGIC STUDY N/A 07/18/2016   Procedure: Atrial Fibrillation Ablation;  Surgeon: Thompson Grayer, MD;  Location: [place] CV LAB;  Service: Cardiovascular;  Laterality: N/A;  . IMPLANTABLE CARDIOVERTER DEFIBRILLATOR IMPLANT  2005   [employer]   . LEFT HEART CATH AND CORS/GRAFTS ANGIOGRAPHY N/A 11/20/2018   Procedure: LEFT HEART CATH AND CORS/GRAFTS ANGIOGRAPHY;  Surgeon: Minna Merritts, MD;  Location: [place] CV LAB;  Service: Cardiovascular;  Laterality: N/A;  . TEE WITHOUT CARDIOVERSION N/A 07/18/2016   Procedure: TRANSESOPHAGEAL ECHOCARDIOGRAM (TEE);  Surgeon: Sueanne Margarita, MD;  Location: [facility] ENDOSCOPY;  Service: Cardiovascular;  Laterality: N/A;  . TEE WITHOUT CARDIOVERSION N/A 10/09/2016    Procedure: TRANSESOPHAGEAL ECHOCARDIOGRAM (TEE);  Surgeon: Lelon Perla, MD;  Location: [facility] ENDOSCOPY;  Service: Cardiovascular;  Laterality: N/A;   Social History Social History   Tobacco Use  . Smoking status: Never Smoker  . Smokeless tobacco: Never Used  Substance Use Topics  . Alcohol use: No  . Drug use: No   Family History Family History  Problem Relation Age of Onset  . CAD Mother   . Diabetes Mother   . Atrial fibrillation Mother   . Lung cancer Father   . Diabetes Father   Denies family history of peripheral artery disease, venous disease or renal disease.  Allergies  Allergen Reactions  . Piperacillin Other (See Comments)    Renal failure  . 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 See ID note**PER PT-CAUSED RENAL FAILURE**  . Zosyn [Piperacillin Sod-Tazobactam So] Other (See Comments)    Renal failure   REVIEW OF SYSTEMS (Negative unless checked)  Constitutional: [] Weight loss  [] Fever  [] Chills Cardiac: [x] Chest pain   [x] Chest pressure   [] Palpitations   [] Shortness of breath when laying flat   [] Shortness of breath at rest   [] Shortness of breath with exertion. Vascular:  [] Pain in legs with walking   [] Pain in legs at rest   [] Pain in legs when laying flat   [] Claudication   [] Pain in feet when walking  [] Pain in feet at rest  [] Pain in feet when laying flat   [] History of DVT   [] Phlebitis   [] Swelling in legs   [] Varicose veins   [] Non-healing ulcers Pulmonary:   [] Uses home oxygen   [] Productive cough   [] Hemoptysis   [] Wheeze  [] COPD   [] Asthma Neurologic:  [] Dizziness  [] Blackouts   [] Seizures   [] History of stroke   [] History of TIA  [] Aphasia   [] Temporary blindness   [] Dysphagia   [] Weakness or numbness in arms   [] Weakness or numbness in legs Musculoskeletal:  [] Arthritis   [] Joint swelling   [] Joint pain   [] Low back pain Hematologic:  [] Easy bruising  [] Easy bleeding   [] Hypercoagulable state   [] Anemic   [] Hepatitis Gastrointestinal:  [] Blood in stool   [] Vomiting blood  [] Gastroesophageal reflux/heartburn   [] Difficulty swallowing.  Genitourinary:  [x] Chronic kidney disease   [] Difficult urination  [] Frequent urination  [] Burning with urination   [] Blood in urine Skin:  [] Rashes   [] Ulcers   [] Wounds Psychological:  [] History of anxiety   []  History of major depression.  Physical Examination  Vitals:   06/29/19 1703 06/29/19 1913 06/30/19 0443 06/30/19 0742  BP: 117/62 111/61 120/65 (!) 102/56  Pulse: 75 82 79 73  Resp:  18 18 16   Temp: (!) 97.5 F (36.4 C) 97.8 F (36.6 C) 98 F (36.7 C) 98.3 F (36.8 C)  TempSrc: Oral Oral  Oral  SpO2: 100% 100% 99% 98%  Weight:   115.6 kg   Height:       Body mass index is 38.75 kg/m. Gen:  WD/WN, NAD Head: [place]/AT, No temporalis wasting. Prominent temp pulse not noted. Ear/Nose/Throat: Hearing grossly intact, nares w/o erythema or drainage, oropharynx w/o Erythema/Exudate Eyes: Sclera non-icteric, conjunctiva clear Neck: Trachea midline.  No JVD.  Pulmonary:  Good air movement, respirations not labored, equal bilaterally.  Cardiac: Irregularly irregular Vascular:  Vessel Right Left  Radial Palpable Palpable  Ulnar Palpable Palpable  Brachial Palpable Palpable  Carotid Palpable, without bruit Palpable, without bruit  Aorta Not palpable N/A  Femoral Palpable Palpable  Popliteal Palpable Palpable  PT BKA BKA  DP BKA BKA   Bilateral BKA Stumps: Healthy and intact.   Gastrointestinal: soft, non-tender/non-distended. No guarding/reflex.   Peritoneal dialysis catheter: Intact.  No signs of infection. Musculoskeletal: M/S 5/5 throughout.   Neurologic: Sensation grossly intact in extremities.  Symmetrical.  Speech is fluent. Motor exam as listed above. Psychiatric: Judgment intact, Mood & affect appropriate for pt's clinical situation. Dermatologic: No rashes or ulcers noted.  No cellulitis or open wounds. Lymph : No Cervical, Axillary,  or Inguinal lymphadenopathy.  CBC Lab Results  Component Value Date   WBC 29.7 (H) 06/28/2019   HGB 9.6 (L) 06/28/2019   HCT 31.3 (L) 06/28/2019   MCV 84.8 06/28/2019   PLT 206 06/28/2019   BMET    Component Value Date/Time   NA 133 (L) 06/30/2019 0956   NA 142 10/07/2018 1425   K 4.0 06/30/2019 0956   CL 95 (L) 06/30/2019 0956   CO2 21 (L) 06/30/2019 0956   GLUCOSE 169 (H) 06/30/2019 0956   BUN 47 (H) 06/30/2019 0956   BUN 39 (H) 10/07/2018 1425   CREATININE 8.06 (H) 06/30/2019 0956   CALCIUM 7.8 (L) 06/30/2019 0956   GFRNONAA 5 (L) 06/30/2019 0956   GFRAA 5 (L) 06/30/2019 0956   Estimated Creatinine Clearance: 8.6 mL/min (A) (by C-G formula based on SCr of 8.06 mg/dL (H)).  COAG Lab Results  Component Value Date   INR 1.8 (H) 06/25/2019   INR 1.5 (H) 11/18/2018   INR 1.16 10/27/2018   Radiology Ct Abdomen Pelvis Wo Contrast  Result Date: 06/25/2019 CLINICAL DATA:  End-stage renal disease, abdominal pain and diarrhea. Generalized edema and hypotension. EXAM: CT ABDOMEN AND PELVIS WITHOUT CONTRAST TECHNIQUE: Multidetector CT imaging of the abdomen and pelvis was performed following the standard protocol without IV contrast. COMPARISON:  Prior CT of the abdomen and pelvis without contrast on 02/15/2019 FINDINGS: Lower chest: No acute abnormality. Hepatobiliary: Stable appearance of liver. Stable calcified gallstones in the dependent gallbladder. No biliary ductal dilatation. Pancreas: Stable atrophic appearance of the pancreas without obvious evidence of acute pancreatitis or visible mass. Spleen: Normal in size without focal abnormality. Adrenals/Urinary Tract: Stable atrophic bilateral kidneys. No hydronephrosis or focal renal lesions identified. Bladder  appears unremarkable with resolution of emphysematous cystitis seen on the prior CT. Stomach/Bowel: Bowel shows no evidence of obstruction, ileus or visible inflammatory process. No free intraperitoneal air identified.  Vascular/Lymphatic: Stable calcified aorta and iliac arteries without aneurysmal disease. Stable mildly prominent retroperitoneal lymph nodes in the mid abdomen. Stable mildly enlarged bilateral inguinal and iliac lymph nodes. Reproductive: Status post hysterectomy. No adnexal masses. Other: Tunneled peritoneal dialysis catheter extends into the pelvis. Free fluid is present in the peritoneal cavity including around the liver and spleen and in the pelvis. Overall amount of free fluid slightly larger than on the prior study, but small in overall volume. Diffuse body wall edema and anasarca present, increased compared to the prior study. Stable right inguinal hernia containing fat. Musculoskeletal: No acute or significant osseous findings. IMPRESSION: 1. Increase in ascites and anasarca. Overall volume of ascites is small, however. A peritoneal dialysis catheter remains within the pelvis. 2. Resolution of emphysematous cystitis seen on the prior CT in June. 3. Stable cholelithiasis. 4. Stable mildly enlarged retroperitoneal, iliac and inguinal lymph nodes. Electronically Signed   By: Aletta Edouard M.D.   On: 06/25/2019 15:53   Nm Myocar Multi W/spect W/wall Motion / Ef  Result Date: 06/27/2019 Pharmacological myocardial perfusion imaging study with no significant Ischemia Large regions of old scar noted in the distal anteroseptal, apical, lateral and inferolateral wall. Diffuse hypokinesis,  EF estimated at 26% No EKG changes concerning for ischemia at peak stress or in recovery. High risk scan given severely depressed EF and regions of old infarct. Signed, Esmond Plants, MD, Ph.D [facility]   Dg Chest Port 1 View  Result Date: 06/25/2019 CLINICAL DATA:  Chest pain EXAM: PORTABLE CHEST 1 VIEW COMPARISON:  02/15/2019 FINDINGS: Left-sided implanted cardiac device. Median sternotomy. Stable cardiomegaly. Calcific aortic knob. No focal airspace consolidation. No pleural effusion or pneumothorax. IMPRESSION:  No acute cardiopulmonary findings. Electronically Signed   By: Davina Poke M.D.   On: 06/25/2019 11:44   Assessment/Plan The patient is a 73 year old female with multiple medical issues (see below) who presented to the [facility] emergency department on June 25, 2019 with a chief complaint of "chest pain". 1. ESRD: Patient with a known history of end-stage renal disease currently maintained at home via peritoneal dialysis.  Peritoneal dialysis catheter is functioning adequately.  Due to lack of care in the home, inability to care for herself and per recommendations of physical therapy the patient will be discharged to a SNF.  The patient is unable to dialyze via her peritoneal dialysis catheter at a SNF and therefore through with placing a PermCath to allow her to transition to hemodialysis.  Procedure, risks and benefits explained to the patient.  The patient states that she has had a PermCath in the past.  All questions were answered.  The patient wishes to proceed.  We will plan on this today. 2. CAD: The patient's chest pain has resolved.  She was seen by cardiology. 3.  Diabetes: On appropriate medications. Encouraged good control as its slows the progression of atherosclerotic disease.  Discussed with Dr. Mayme Genta, PA-C  06/30/2019 11:05 AM  This note was created with Dragon medical transcription system.  Any error is purely unintentional

## 2019-06-30 NOTE — Progress Notes (Signed)
Patient off the floor to specials for perm cath placement

## 2019-06-30 NOTE — Progress Notes (Signed)
[facility] [place] NAME: Caroline Hernandez    MR#:  [id number]  DATE OF BIRTH:  08-14-1946  SUBJECTIVE:  patient denies any complaints. She is NPO for HD catheter placement. She is been converted to hemodialysis so that she can go to rehab. Patient is agreeable with plan.  REVIEW OF SYSTEMS:   Review of Systems  Constitutional: Negative for chills, fever and weight loss.  HENT: Negative for ear discharge, ear pain and nosebleeds.   Eyes: Negative for blurred vision, pain and discharge.  Respiratory: Negative for sputum production, shortness of breath, wheezing and stridor.   Cardiovascular: Negative for chest pain, palpitations, orthopnea and PND.  Gastrointestinal: Negative for abdominal pain, diarrhea, nausea and vomiting.  Genitourinary: Negative for frequency and urgency.  Musculoskeletal: Negative for back pain and joint pain.  Neurological: Positive for weakness. Negative for sensory change, speech change and focal weakness.  Psychiatric/Behavioral: Negative for depression and hallucinations. The patient is not nervous/anxious.    Tolerating Diet:npo Tolerating PT: rehab  DRUG ALLERGIES:   Allergies  Allergen Reactions  . Piperacillin Other (See Comments)    Renal failure  . 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 See ID note**PER PT-CAUSED RENAL FAILURE**  . Zosyn [Piperacillin Sod-Tazobactam So] Other (See Comments)    Renal failure    VITALS:  Blood pressure (!) 96/57, pulse 74, temperature 97.7 F (36.5 C), temperature source Oral, resp. rate 17, height 5\' 8"  (1.727 m), weight 115.6 kg, SpO2 94 %.  PHYSICAL EXAMINATION:   Physical Exam  GENERAL:  73 y.o.-year-old patient lying in the bed with no acute distress.  EYES: Pupils equal, round, reactive to light and accommodation. No scleral icterus. Extraocular muscles intact.  HEENT: Head atraumatic, normocephalic. Oropharynx  and nasopharynx clear.  NECK:  Supple, no jugular venous distention. No thyroid enlargement, no tenderness.  LUNGS: Normal breath sounds bilaterally, no wheezing, rales, rhonchi. No use of accessory muscles of respiration.  CARDIOVASCULAR: S1, S2 normal. No murmurs, rubs, or gallops.  ABDOMEN: Soft, nontender, nondistended. Bowel sounds present. No organomegaly or mass.  EXTREMITIES bilateral BKA NEUROLOGIC: Cranial nerves II through XII are intact. No focal Motor or sensory deficits b/l.   PSYCHIATRIC:  patient is alert and oriented x 3.  SKIN: No obvious rash, lesion, or ulcer.   LABORATORY PANEL:  CBC Recent Labs  Lab 06/28/19 0643  WBC 29.7*  HGB 9.6*  HCT 31.3*  PLT 206    Chemistries  Recent Labs  Lab 06/25/19 1235 06/26/19 0414  06/30/19 0956  NA  --  133*   < > 133*  K  --  3.7   < > 4.0  CL  --  93*   < > 95*  CO2  --  25   < > 21*  GLUCOSE  --  216*   < > 169*  BUN  --  54*   < > 47*  CREATININE  --  7.84*   < > 8.06*  CALCIUM  --  7.5*   < > 7.8*  MG  --  1.9  --   --   AST 24  --   --   --   ALT 19  --   --   --   ALKPHOS 74  --   --   --   BILITOT 0.7  --   --   --    < > = values in  this interval not displayed.   Cardiac Enzymes No results for input(s): TROPONINI in the last 168 hours. RADIOLOGY:  No results found. ASSESSMENT AND PLAN:  Patient is a 73 year old female with history of end-stage renal disease on peritoneal dialysis,chronic combined systolic and diastolic CHF,peripheral artery disease status post bilateral BKA,diabetes mellitus,CLL on chronic atrial fibrillation on anticoagulation with Eliquis who presented to the emergency room with complaints of chest pain,diarrhea and abdominal discomfort  *Chest pain Concerning for unstable angina--resolved Cardiology consult with dr Rockey Situ Cath held due to drop in HB intially. Heparin stopped. Hb now stabilized Stool negative for blood Eliquisnow resumed Stress testnegative with EF 26 %  (cardiomyopathy) per cardiolog-- Imdur added, cont plavix and atorvastatin. D/c amlodipine and losartan  *Diarrhea and abdominal discomfort Ongoing for the last 2 days. Patient has chronic leukocytosis due to underlying CLL. C diff Negative Abd pain- Discussed with Dr. Holley Raring. No concern for SBP as PD fluid is clear. -Patient to get perm cath placed for transitioning to hemodialysis till she goes to rehab.  *Diabetes mellitus type 2 Home insulin dosing changed due to sugars very well controlled and want to avoid hypoglycemia Placed on sliding scale insulin coverage.  *End-stage renal disease on peritoneal dialysis Patient reports increasing weight gain Nephrology consulted  *chronic systolic CHF ef 26 % on Myoview -Cont Diuretics and pt currently appears euvolemic. Mild edema on the amputation stump  *Chronic atrial fibrillation Rate controlled. Continue home dose of Eliquis  *Peripheral artery disease Patient status post bilateral BKA  * Anemia of chronic disease. Stable. Baseline 9.5-10  Palliative care to follow after discharge.  Patient will discharged to rehab once perm cath is placed she gets dialysis and has outpatient chair time for dialysis.  Case discussed with Care Management/Social Worker. Management plans discussed with the patient  and they are in agreement.  CODE STATUS: full  DVT Prophylaxis: eliquis  TOTAL TIME TAKING CARE OF THIS PATIENT: *30* minutes.  >50% time spent on counselling and coordination of care  POSSIBLE D/C IN *few* DAYS, DEPENDING ON CLINICAL CONDITION.  Note: This dictation was prepared with Dragon dictation along with smaller phrase technology. Any transcriptional errors that result from this process are unintentional.  Fritzi Mandes M.D on 06/30/2019 at 1:25 PM  Between 7am to 6pm - Pager - [phone]  After 6pm go to [email or web address] - password [email or web address] [facility]  Office   [phone]  CC: Primary care physician; System, Pcp Not InPatient ID: Kemesha Mosey, female   DOB: [birth year], 73 y.o.   MRN: [id number]

## 2019-06-30 NOTE — Progress Notes (Signed)
Pre HD Tx note Pt arrived from her room to receive first HD Tx after Right perm cath placement.  Pt is aler and O*4. Pt is on 2L O2 . BP is WDL. Pt just received pain meds prior to tx initiation  3K2.5Ca for 2.5 hours for BFR 300   06/30/19 1615  Vital Signs  Temp 97.8 F (36.6 C)  Temp Source Oral  Pulse Rate 77  Pulse Rate Source Monitor  Resp 18  BP (!) 111/57  BP Location Right Arm  BP Method Automatic  Patient Position (if appropriate) Lying  Oxygen Therapy  SpO2 100 %  O2 Device Nasal Cannula  O2 Flow Rate (L/min) 2 L/min  Pain Assessment  Pain Scale 0-10  Pain Score 0  Machine Checks  Machine Number 7  Station Number 2  UF/Alarm Test Passed  Conductivity: Meter 13.8  Conductivity: Machine  13.9  pH 7.4  Reverse Osmosis Main  Normal Saline Lot Number [id number]  Dialyzer Lot Number [id number]  Disposable Set Lot Number [id number]  Machine Temperature 98.6 F (37 C)  Musician and Audible Yes  Blood Lines Intact and Secured Yes  Pre Treatment Patient Checks  Vascular access used during treatment Catheter  HD catheter dressing before treatment WDL  Patient is receiving dialysis in a chair Yes  Hepatitis B Surface Antigen Results Pending  Isolation Initiated Yes  Hepatitis B Surface Antibody  (pending)  Date Hepatitis B Surface Antibody Drawn 06/30/19  Hemodialysis Consent Verified Yes  Hemodialysis Standing Orders Initiated Yes  ECG (Telemetry) Monitor On Yes  Prime Ordered Normal Saline  Length of  DialysisTreatment -hour(s) 2.5 Hour(s)  Dialysis Treatment Comments  (Na140)  Dialyzer Elisio 17H NR  Dialysate 3K;2.5 Ca  Dialysis Anticoagulant None  Dialysate Flow Ordered 800  Blood Flow Rate Ordered 300 mL/min  Ultrafiltration Goal 1 Liters  Pre Treatment Labs Hepatitis B Surface Antigen  Dialysis Blood Pressure Support Ordered Albumin

## 2019-06-30 NOTE — Progress Notes (Signed)
HD Tx started   06/30/19 1635  Vital Signs  Pulse Rate 77  Pulse Rate Source Monitor  Resp (!) 22  BP 111/60  BP Location Right Arm  BP Method Automatic  Patient Position (if appropriate) Lying  Oxygen Therapy  SpO2 100 %  O2 Device Nasal Cannula  O2 Flow Rate (L/min) 2 L/min  Pain Assessment  Pain Scale 0-10  Pain Score 0  During Hemodialysis Assessment  Blood Flow Rate (mL/min) 300 mL/min  Arterial Pressure (mmHg) -120 mmHg  Venous Pressure (mmHg) 130 mmHg  Transmembrane Pressure (mmHg) 60 mmHg  Ultrafiltration Rate (mL/min) 580 mL/min  Dialysate Flow Rate (mL/min) 800 ml/min  Conductivity: Machine  13.9  HD Safety Checks Performed Yes  Dialysis Fluid Bolus Normal Saline  Bolus Amount (mL) 250 mL  Intra-Hemodialysis Comments Tx initiated

## 2019-06-30 NOTE — Progress Notes (Signed)
[facility]  ROUNDING NOTE   Subjective:   Peritoneal dialysis last night. Tolerated treatment well.  Patient will be discharged to SNF. Will need to be transitioned to hemodialysis.  Objective:  Vital signs in last 24 hours:  Temp:  [97.5 F (36.4 C)-98.3 F (36.8 C)] 98.3 F (36.8 C) (10/19 0742) Pulse Rate:  [73-82] 73 (10/19 0742) Resp:  [16-18] 16 (10/19 0742) BP: (102-120)/(56-65) 102/56 (10/19 0742) SpO2:  [98 %-100 %] 98 % (10/19 0742) Weight:  [115.6 kg] 115.6 kg (10/19 0443)  Weight change: 0.386 kg Filed Weights   06/28/19 0507 06/29/19 0456 06/30/19 0443  Weight: 113.4 kg 115.2 kg 115.6 kg    Intake/Output: I/O last 3 completed shifts: In: -  Out: 2339 [Urine:260; Other:2079]   Intake/Output this shift:  No intake/output data recorded.  Physical Exam: General: No acute distress, laying in bed  Head: Normocephalic, atraumatic. Moist oral mucosal membranes  Eyes: Anicteric  Neck: No JVD  Lungs:  Basilar rales   Heart: irregular  Abdomen:  Soft, obese  Extremities: Bilateral BKA, edema ++  Neurologic: Awake, alert, following commands  Skin: No lesions  Access: PD catheter in place    Basic Metabolic Panel: Recent Labs  Lab 06/25/19 1128 06/26/19 0414 06/27/19 0840 06/28/19 0643 06/30/19 0956  NA 135 133* 136 136 133*  K 4.4 3.7 4.2 3.8 4.0  CL 91* 93* 94* 93* 95*  CO2 24 25 25 25  21*  GLUCOSE 183* 216* 164* 142* 169*  BUN 57* 54* 52* 47* 47*  CREATININE 8.20* 7.84* 7.66* 7.53* 8.06*  CALCIUM 7.6* 7.5* 7.7* 7.9* 7.8*  MG  --  1.9  --   --   --   PHOS  --  8.8*  --   --   --     Liver Function Tests: Recent Labs  Lab 06/25/19 1235  AST 24  ALT 19  ALKPHOS 74  BILITOT 0.7  PROT 5.3*  ALBUMIN 2.3*   No results for input(s): LIPASE, AMYLASE in the last 168 hours. No results for input(s): AMMONIA in the last 168 hours.  CBC: Recent Labs  Lab 06/25/19 1128 06/26/19 0414 06/26/19 1551 06/27/19 0840 06/28/19 0643   WBC 24.2* 35.7* 35.0* 35.0* 29.7*  NEUTROABS  --   --   --  6.1 5.1  HGB 11.2* 8.5* 9.3* 9.5* 9.6*  HCT 35.9* 27.8* 30.7* 30.6* 31.3*  MCV 84.7 85.3 86.2 85.5 84.8  PLT 151 180 195 191 206    Cardiac Enzymes: No results for input(s): CKTOTAL, CKMB, CKMBINDEX, TROPONINI in the last 168 hours.  BNP: Invalid input(s): POCBNP  CBG: Recent Labs  Lab 06/29/19 0746 06/29/19 1220 06/29/19 1647 06/29/19 2118 06/30/19 0743  GLUCAP 188* 97 80 161* 167*    Microbiology: Results for orders placed or performed during the hospital encounter of 06/25/19  SARS CORONAVIRUS 2 (TAT 6-24 HRS) Nasopharyngeal Nasopharyngeal Swab     Status: None   Collection Time: 06/25/19 11:27 AM   Specimen: Nasopharyngeal Swab  Result Value Ref Range Status   SARS Coronavirus 2 NEGATIVE NEGATIVE Final    Comment: (NOTE) SARS-CoV-2 target nucleic acids are NOT DETECTED. The SARS-CoV-2 RNA is generally detectable in upper and lower respiratory specimens during the acute phase of infection. Negative results do not preclude SARS-CoV-2 infection, do not rule out co-infections with other pathogens, and should not be used as the sole basis for treatment or other patient management decisions. Negative results must be combined with clinical observations, patient  history, and epidemiological information. The expected result is Negative. Fact Sheet for Patients: SugarRoll.be Fact Sheet for Healthcare Providers: https://www.woods-mathews.com/ This test is not yet approved or cleared by the [place] FDA and  has been authorized for detection and/or diagnosis of SARS-CoV-2 by FDA under an Emergency Use Authorization (EUA). This EUA will remain  in effect (meaning this test can be used) for the duration of the COVID-19 declaration under Section 56 4(b)(1) of the Act, 21 U.S.C. section 360bbb-3(b)(1), unless the authorization is terminated or revoked sooner. Performed  at [facility], [place] [address] [place], [place] [address]   GI pathogen panel by PCR, stool     Status: None   Collection Time: 06/25/19  1:04 PM   Specimen: STOOL  Result Value Ref Range Status   Plesiomonas shigelloides NOT DETECTED NOT DETECTED Final   Yersinia enterocolitica NOT DETECTED NOT DETECTED Final   Vibrio NOT DETECTED NOT DETECTED Final   Enteropathogenic E coli NOT DETECTED NOT DETECTED Final   E coli (ETEC) LT/ST NOT DETECTED NOT DETECTED Final   E coli 7209 by PCR Not applicable NOT DETECTED Final   Cryptosporidium by PCR NOT DETECTED NOT DETECTED Final   Entamoeba histolytica NOT DETECTED NOT DETECTED Final   Adenovirus F 40/41 NOT DETECTED NOT DETECTED Final   Norovirus GI/GII NOT DETECTED NOT DETECTED Final   Sapovirus NOT DETECTED NOT DETECTED Final    Comment: (NOTE) Performed At: [facility] [place], [place] [id number] Rush Farmer MD OQ:[id number]    Vibrio cholerae NOT DETECTED NOT DETECTED Final   Campylobacter by PCR NOT DETECTED NOT DETECTED Final   Salmonella by PCR NOT DETECTED NOT DETECTED Final   E coli (STEC) NOT DETECTED NOT DETECTED Final   Enteroaggregative E coli NOT DETECTED NOT DETECTED Final   Shigella by PCR NOT DETECTED NOT DETECTED Final   Cyclospora cayetanensis NOT DETECTED NOT DETECTED Final   Astrovirus NOT DETECTED NOT DETECTED Final   G lamblia by PCR NOT DETECTED NOT DETECTED Final   Rotavirus A by PCR NOT DETECTED NOT DETECTED Final  C difficile quick scan w PCR reflex     Status: None   Collection Time: 06/25/19  2:55 PM   Specimen: STOOL  Result Value Ref Range Status   C Diff antigen NEGATIVE NEGATIVE Final   C Diff toxin NEGATIVE NEGATIVE Final   C Diff interpretation No C. difficile detected.  Final    Comment: Performed at [facility], [address] [place], [place] [address]  MRSA PCR Screening     Status: None   Collection Time: 06/26/19 10:37 PM   Specimen:  Nasopharyngeal  Result Value Ref Range Status   MRSA by PCR NEGATIVE NEGATIVE Final    Comment:        The GeneXpert MRSA Assay (FDA approved for NASAL specimens only), is one component of a comprehensive MRSA colonization surveillance program. It is not intended to diagnose MRSA infection nor to guide or monitor treatment for MRSA infections. Performed at [facility], [address] [place], [place] [address]   Body fluid culture     Status: None   Collection Time: 06/27/19 10:20 AM   Specimen: Peritoneal Washings; Body Fluid  Result Value Ref Range Status   Specimen Description   Final    PERITONEAL Performed at [facility], [address], [place], [place] [address]    Special Requests   Final    Normal Performed at  [place], [place] [address]    Gram Stain NO WBC SEEN NO ORGANISMS SEEN   Final   Culture   Final    NO GROWTH 3 DAYS Performed at [facility], [place] [address], [place], [place] [address]    Report Status 06/30/2019 FINAL  Final    Coagulation Studies: No results for input(s): LABPROT, INR in the last 72 hours.  Urinalysis: Recent Labs    06/28/19 2354  COLORURINE YELLOW*  LABSPEC 1.013  PHURINE 7.0  GLUCOSEU NEGATIVE  HGBUR SMALL*  BILIRUBINUR NEGATIVE  KETONESUR NEGATIVE  PROTEINUR >=300*  NITRITE NEGATIVE  LEUKOCYTESUR MODERATE*      Imaging: No results found.   Medications:   . dialysis solution 2.5% low-MG/low-CA     . amitriptyline  25 mg Oral QHS  . apixaban  5 mg Oral BID  . atorvastatin  40 mg Oral QHS  . Chlorhexidine Gluconate Cloth  6 each Topical Daily  . cholecalciferol  4,000 Units Oral Daily  . clopidogrel  75 mg Oral Daily  . extraneal (ICODEXTRIN) peritoneal dialysis solution   Intraperitoneal Q24H  . gabapentin  100 mg Oral BID  . gentamicin cream  1 application Topical Daily  . insulin aspart  0-5 Units Subcutaneous QHS  . insulin aspart   0-9 Units Subcutaneous TID WC  . insulin aspart  4 Units Subcutaneous TID WC  . insulin glargine  12 Units Subcutaneous Daily  . iron polysaccharides  150 mg Oral Daily  . isosorbide mononitrate  30 mg Oral Daily  . multivitamin-lutein  1 capsule Oral BID  . nystatin   Topical TID  . pantoprazole  40 mg Oral QHS  . sertraline  100 mg Oral QHS  . sevelamer carbonate  2,400 mg Oral TID WC  . vitamin C  500 mg Oral Daily   acetaminophen, nitroGLYCERIN  Assessment/ Plan:  73 y.o.Caroline Hernandez female with ESRD on PD, bilateral BKA, coronary artery disease status post CABG, chronic systolic heart failure, CLL, diabetes mellitus type 2, hypertension, GERD, hyperlipidemia, ischemic cardiomyopathy, peripheral arterial disease, admitted with chest pain, nausea, and diarrhea.  [facility] Caroline Hernandez Peritoneal dialysis CCPD 9 hours 5 exchanges 3000 fills with last fill of 1064mL of icodextran  1.  ESRD on peritoneal dialysis: tolerated dialysis well last night.  - Transition to hemodialysis today for discharge planning.  - Appreciate vascular input, permcath for today.   2. Hypertension:  - isosorbide mononitrate  3.  Anemia of chronic any disease:  - EPO with HD treatment  4.  Secondary hyperparathyroidism - sevelamer with meals.    LOS: 4 Caroline Hernandez 10/19/202011:31 AM

## 2019-06-30 NOTE — Care Management Important Message (Signed)
Important Message  Patient Details  Name: Caroline Hernandez MRN: [id number] Date of Birth: 05-01-1946   Medicare Important Message Given:  Yes     Dannette Barbara 06/30/2019, 11:00 AM

## 2019-06-30 NOTE — TOC Progression Note (Addendum)
Transition of Care [facility]) - Progression Note    Patient Details  Name: Sundra Haddix MRN: [id number] Date of Birth: 1946/03/03  Transition of Care [facility]) CM/SW Contact  [name], RN Phone Number: 06/30/2019, 8:53 AM  Clinical Narrative:   Patient is [facility].  [place] started  insurance authorization this morning. Reference number [id number].  Faxed clinicals to [phone].  No bed offers as of yet this morning.  Dr. Lucky Cowboy placing HD catheter today and patient will have HD s/p placement.        Expected Discharge Plan: [place] Barriers to Discharge: Continued Medical Work up  Expected Discharge Plan and Services Expected Discharge Plan: [place]   Discharge Planning Services: CM Consult Post Acute Care Choice: [place] arrangements for the past 2 months: Single Family Home Expected Discharge Date: 06/29/19                         HH Arranged: RN, PT, Nurse's Aide, Social Work, OT [place] Agency: [place] Date [place]: 06/27/19 Time [place]: 1021 Representative spoke with at [place]: [place] ([place]) Interventions    Readmission Risk Interventions Readmission Risk Prevention Plan 06/27/2019 06/27/2019 02/18/2019  Transportation Screening - Complete Complete  Medication Review Press photographer) - Complete -  PCP or Specialist appointment within 3-5 days of discharge - Complete Complete  HRI or [place] - Complete (No Data)  SW Recovery Care/Counseling Consult - Complete (No Data)  Palliative Care Screening - Complete Not [place] Not Applicable Not Complete Not Applicable  SNF Comments - pending PT eval -  Some recent data might be hidden

## 2019-06-30 NOTE — Progress Notes (Signed)
Patient at dialysis report given to dialysis nurse m

## 2019-06-30 NOTE — Plan of Care (Signed)
  Problem: Education: Goal: Knowledge of General Education information will improve Description: Including pain rating scale, medication(s)/side effects and non-pharmacologic comfort measures Outcome: Progressing   Problem: Clinical Measurements: Goal: Ability to maintain clinical measurements within normal limits will improve Outcome: Progressing Goal: Will remain free from infection Outcome: Progressing Goal: Diagnostic test results will improve Outcome: Progressing Goal: Respiratory complications will improve Outcome: Progressing Goal: Cardiovascular complication will be avoided Outcome: Progressing   Problem: Clinical Measurements: Goal: Ability to maintain clinical measurements within normal limits will improve Outcome: Progressing Goal: Will remain free from infection Outcome: Progressing Goal: Diagnostic test results will improve Outcome: Progressing Goal: Respiratory complications will improve Outcome: Progressing Goal: Cardiovascular complication will be avoided Outcome: Progressing   Problem: Nutrition: Goal: Adequate nutrition will be maintained Outcome: Progressing  Pt received her first HD Tx today after CVC placement. Pt tolerated well the Tx. UF goal prescribed was met.

## 2019-06-30 NOTE — Progress Notes (Signed)
Post HD Tx Pt tolerated well the tx. Pt received Epr for Hmg of 9.6 on 06/28/2019 as prescribed. MAR had the medication scheduled for M W F starting from 07/02/2019. However, I confirmed with Dr. Melwood Bing that Pt can have the EPO starting from today.  UF goal prescribed of 1576ml is achived. Pt had Tx for 2.5 hours as prescribed. Pt reported no chest or SOB post tx. CVC is 1000U heparin Locked closed and clamped.   06/30/19 2000  Hand-Off documentation  Report given to (Full Name) Lenis Dickinson RN   Report received from (Full Name) Newt Minion RN  Vital Signs  Temp 98.7 F (37.1 C)  Temp Source Oral  Pulse Rate 78  Resp (!) 25  BP (!) 102/51  BP Location Right Arm  BP Method Automatic  Patient Position (if appropriate) Lying  Oxygen Therapy  SpO2 100 %  O2 Device Nasal Cannula  O2 Flow Rate (L/min) 2 L/min  Pain Assessment  Pain Scale 0-10  Pain Score 0  Post-Hemodialysis Assessment  Rinseback Volume (mL) 250 mL  KECN 48.7 V  Dialyzer Clearance Lightly streaked  Duration of HD Treatment -hour(s) 3.5 hour(s)  Hemodialysis Intake (mL) 500 mL  UF Total -Machine (mL) 1500 mL  Net UF (mL) 1000 mL  Tolerated HD Treatment Yes  Hemodialysis Catheter Right Internal jugular Double lumen Permanent (Tunneled)  Placement Date/Time: 06/30/19 1334   Time Out: Correct patient;Correct site;Correct procedure  Maximum sterile barrier precautions: Hand hygiene;Cap;Mask;Sterile gown;Large sterile sheet;Sterile gloves  Site Prep: Chlorhexidine (preferred);Skin Prep C...  Site Condition No complications  Blue Lumen Status Heparin locked  Red Lumen Status Heparin locked  Purple Lumen Status N/A  Catheter fill solution Heparin 1000 units/ml  Catheter fill volume (Arterial) 2.2 cc  Catheter fill volume (Venous) 2.2  Dressing Type Gauze/Drain sponge  Dressing Status Clean;Dry;Intact  Interventions Dressing reinforced  Drainage Description None  Dressing Change Due 07/01/19  Post treatment catheter  status Capped and Clamped

## 2019-06-30 NOTE — TOC Progression Note (Signed)
Transition of Care [facility]) - Progression Note    Patient Details  Name: Caroline Hernandez MRN: [id number] Date of Birth: 22-Mar-1946  Transition of Care [facility]) CM/SW Contact  [name], RN Phone Number: 06/30/2019, 12:11 PM  Clinical Narrative:   Caroline Grippe' daughter that [facility] has offered a bed.  Explained we should have authorization by tomorrow.  She would like to wait and see if any other facilities offer today.  Will continue to update her and assist with DC planning.      Expected Discharge Plan: [place] Barriers to Discharge: Continued Medical Work up  Expected Discharge Plan and Services Expected Discharge Plan: [place]   Discharge Planning Services: CM Consult Post Acute Care Choice: [place] arrangements for the past 2 months: Single Family Home Expected Discharge Date: 06/29/19                         HH Arranged: RN, PT, Nurse's Aide, Social Work, OT [place] Agency: [place] Date [place]: 06/27/19 Time [place]: 1021 Representative spoke with at [place]: [place] ([place]) Interventions    Readmission Risk Interventions Readmission Risk Prevention Plan 06/27/2019 06/27/2019 02/18/2019  Transportation Screening - Complete Complete  Medication Review Press photographer) - Complete -  PCP or Specialist appointment within 3-5 days of discharge - Complete Complete  HRI or [place] - Complete (No Data)  SW Recovery Care/Counseling Consult - Complete (No Data)  Palliative Care Screening - Complete Not [place] Not Applicable Not Complete Not Applicable  SNF Comments - pending PT eval -  Some recent data might be hidden

## 2019-06-30 NOTE — Op Note (Signed)
OPERATIVE NOTE    PRE-OPERATIVE DIAGNOSIS: 1. ESRD   POST-OPERATIVE DIAGNOSIS: same as above  PROCEDURE: 1. Ultrasound guidance for vascular access to the right internal jugular vein and right external jugular vein 2. Right jugular venogram and superior venacavogram 3. Fluoroscopic guidance for placement of catheter 4. Placement of a 19 cm tip to cuff tunneled hemodialysis catheter via the right external jugular vein  SURGEON: Leotis Pain, MD  ANESTHESIA:  Local with Moderate conscious sedation for approximately 20 minutes using 2 mg of Versed and 50 mcg of Fentanyl  ESTIMATED BLOOD LOSS: 10 cc  FLUORO TIME: 1.2 minutes  CONTRAST: 10 cc  FINDING(S): 1.  Patent right external jugular vein which was large with an occluded right internal jugular vein  SPECIMEN(S):  None  INDICATIONS:   Caroline Hernandez is a 73 y.o.female who presents with renal failure.  She has been on peritoneal dialysis but has to transition to hemodialysis to go to a skilled nursing facility.  The patient needs long term dialysis access for their ESRD, and a Permcath is necessary.  Risks and benefits are discussed and informed consent is obtained.    DESCRIPTION: After obtaining full informed written consent, the patient was brought back to the vascular suited. The patient's right neck and chest were sterilely prepped and draped in a sterile surgical field was created. Moderate conscious sedation was administered during a face to face encounter with the patient throughout the procedure with my supervision of the RN administering medicines and monitoring the patient's vital signs, pulse oximetry, telemetry and mental status throughout from the start of the procedure until the patient was taken to the recovery room.  The right internal jugular vein was visualized with ultrasound and found to be patent in the neck but then became small towards the clavicle. It was then accessed under direct ultrasound guidance and a  permanent image was recorded.  A wire would not pass easily so a needle venogram was done which showed an occluded right internal jugular vein.  In evaluating the neck, a large right external jugular vein could be found.  I then used a micropuncture needle and accessed the right external jugular vein with a micropuncture needle under direct ultrasound guidance and a permanent image was recorded.  A micropuncture wire and sheath were then placed.  Imaging was performed through a micropuncture sheath in the right internal jugular vein which showed this right external jugular vein, right innominate vein, and superior vena cava to be widely patent. A 0.035 wire was placed. After skin nick and dilatation, the peel-away sheath was placed over the wire. I then turned my attention to an area under the clavicle. Approximately 1-2 fingerbreadths below the clavicle a small counterincision was created and tunneled from the subclavicular incision to the access site. Using fluoroscopic guidance, a 19 centimeter tip to cuff tunneled hemodialysis catheter was selected, and tunneled from the subclavicular incision to the access site. It was then placed through the peel-away sheath and the peel-away sheath was removed. Using fluoroscopic guidance the catheter tips were parked in the right atrium. The appropriate distal connectors were placed. It withdrew blood well and flushed easily with heparinized saline and a concentrated heparin solution was then placed. It was secured to the chest wall with 2 Prolene sutures. The access incision was closed single 4-0 Monocryl. A 4-0 Monocryl pursestring suture was placed around the exit site. Sterile dressings were placed. The patient tolerated the procedure well and was taken to the recovery room in  stable condition.  COMPLICATIONS: None  CONDITION: Stable  Leotis Pain, MD 06/30/2019 1:59 PM   This note was created with Dragon Medical transcription system. Any errors in dictation  are purely unintentional.

## 2019-06-30 NOTE — TOC Progression Note (Addendum)
Transition of Care [facility]) - Progression Note    Patient Details  Name: Florrie Ramires MRN: [id number] Date of Birth: August 09, 1946  Transition of Care [facility]) CM/SW Contact  [name], RN Phone Number: 06/30/2019, 3:35 PM  Clinical Narrative:   Spoke with daughter, [name].  She would like to chose [facility]  [facility] with this update.  Reference [id number].  Patient has insurance authorization as of today.  Authorization will be good through the 22nd.     Expected Discharge Plan: [place] Barriers to Discharge: Continued Medical Work up  Expected Discharge Plan and Services Expected Discharge Plan: [place]   Discharge Planning Services: CM Consult Post Acute Care Choice: [place] arrangements for the past 2 months: Single Family Home Expected Discharge Date: 06/29/19                         HH Arranged: RN, PT, Nurse's Aide, Social Work, OT [place] Agency: [place] Date [place]: 06/27/19 Time [place]: 1021 Representative spoke with at [place]: [place] ([place]) Interventions    Readmission Risk Interventions Readmission Risk Prevention Plan 06/27/2019 06/27/2019 02/18/2019  Transportation Screening - Complete Complete  Medication Review Press photographer) - Complete -  PCP or Specialist appointment within 3-5 days of discharge - Complete Complete  HRI or [place] - Complete (No Data)  SW Recovery Care/Counseling Consult - Complete (No Data)  Palliative Care Screening - Complete Not [place] Not Applicable Not Complete Not Applicable  SNF Comments - pending PT eval -  Some recent data might be hidden

## 2019-06-30 NOTE — Progress Notes (Signed)
Pre hd assessment   06/30/19 1535  Neurological  Level of Consciousness Alert  Orientation Level Oriented X4  Respiratory  Respiratory Pattern Regular;Unlabored  Chest Assessment Chest expansion symmetrical  Bilateral Breath Sounds Clear;Diminished  Cardiac  Pulse Irregular  Jugular Venous Distention (JVD) No  ECG Monitor Yes  Cardiac Rhythm Atrial fibrillation  Vascular  R Radial Pulse +2  L Radial Pulse +2  Integumentary  Integumentary (WDL) X  Skin Color Appropriate for ethnicity  Musculoskeletal  Musculoskeletal (WDL) X  Generalized Weakness Yes  Gastrointestinal  Bowel Sounds Assessment Active  GU Assessment  Genitourinary (WDL) X  Genitourinary Symptoms Oliguria

## 2019-07-01 ENCOUNTER — Ambulatory Visit: Payer: Medicare HMO | Admitting: Physician Assistant

## 2019-07-01 ENCOUNTER — Encounter: Payer: Self-pay | Admitting: Vascular Surgery

## 2019-07-01 LAB — GLUCOSE, CAPILLARY
Glucose-Capillary: 116 mg/dL — ABNORMAL HIGH (ref 70–99)
Glucose-Capillary: 134 mg/dL — ABNORMAL HIGH (ref 70–99)
Glucose-Capillary: 92 mg/dL (ref 70–99)
Glucose-Capillary: 60 mg/dL — ABNORMAL LOW (ref 70–99)
Glucose-Capillary: 107 mg/dL — ABNORMAL HIGH (ref 70–99)
Glucose-Capillary: 68 mg/dL — ABNORMAL LOW (ref 70–99)
Glucose-Capillary: 141 mg/dL — ABNORMAL HIGH (ref 70–99)

## 2019-07-01 LAB — CBC
HCT: 30.3 % — ABNORMAL LOW (ref 36.0–46.0)
Hemoglobin: 9 g/dL — ABNORMAL LOW (ref 12.0–15.0)
MCH: 26 pg (ref 26.0–34.0)
MCHC: 29.7 g/dL — ABNORMAL LOW (ref 30.0–36.0)
MCV: 87.6 fL (ref 80.0–100.0)
Platelets: 175 10*3/uL (ref 150–400)
RBC: 3.46 MIL/uL — ABNORMAL LOW (ref 3.87–5.11)
RDW: 17.2 % — ABNORMAL HIGH (ref 11.5–15.5)
WBC: 31.2 10*3/uL — ABNORMAL HIGH (ref 4.0–10.5)
nRBC: 0 % (ref 0.0–0.2)

## 2019-07-01 MED ORDER — SODIUM CHLORIDE 0.9 % IV SOLN
INTRAVENOUS | Status: DC | PRN
  Administered 2019-07-01: 250 mL via INTRAVENOUS

## 2019-07-01 MED ORDER — RENA-VITE PO TABS
1.0000 | ORAL_TABLET | Freq: Every day | ORAL | Status: DC
  Administered 2019-07-01: 1 via ORAL
  Filled 2019-07-01 (×2): qty 1

## 2019-07-01 MED ORDER — NEPRO/CARBSTEADY PO LIQD
237.0000 mL | Freq: Two times a day (BID) | ORAL | Status: DC
  Administered 2019-07-01 – 2019-07-02 (×2): 237 mL via ORAL

## 2019-07-01 NOTE — Progress Notes (Signed)
[place] at [place] NAME: Caroline Hernandez    MR#:  [id number]  DATE OF BIRTH:  [birth year]  SUBJECTIVE:  patient denies any complaints. S/p HD cath placement with HD started. tolerating well  REVIEW OF SYSTEMS:   Review of Systems  Constitutional: Negative for chills, fever and weight loss.  HENT: Negative for ear discharge, ear pain and nosebleeds.   Eyes: Negative for blurred vision, pain and discharge.  Respiratory: Negative for sputum production, shortness of breath, wheezing and stridor.   Cardiovascular: Negative for chest pain, palpitations, orthopnea and PND.  Gastrointestinal: Negative for abdominal pain, diarrhea, nausea and vomiting.  Genitourinary: Negative for frequency and urgency.  Musculoskeletal: Negative for back pain and joint pain.  Neurological: Positive for weakness. Negative for sensory change, speech change and focal weakness.  Psychiatric/Behavioral: Negative for depression and hallucinations. The patient is not nervous/anxious.    Tolerating Diet:yes Tolerating PT: rehab  DRUG ALLERGIES:   Allergies  Allergen Reactions  . Piperacillin Other (See Comments)    Renal failure  . 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 See ID note**PER PT-CAUSED RENAL FAILURE**  . Zosyn [Piperacillin Sod-Tazobactam So] Other (See Comments)    Renal failure    VITALS:  Blood pressure (!) 97/49, pulse 83, temperature 97.7 F (36.5 C), temperature source Oral, resp. rate 19, height 5\' 8"  (1.727 m), weight 110 kg, SpO2 94 %.  PHYSICAL EXAMINATION:   Physical Exam  GENERAL:  73 y.o.-year-old patient lying in the bed with no acute distress.  EYES: Pupils equal, round, reactive to light and accommodation. No scleral icterus. Extraocular muscles intact.  HEENT: Head atraumatic, normocephalic. Oropharynx and nasopharynx clear.  NECK:  Supple, no jugular venous distention. No thyroid  enlargement, no tenderness.  LUNGS: Normal breath sounds bilaterally, no wheezing, rales, rhonchi. No use of accessory muscles of respiration.  CARDIOVASCULAR: S1, S2 normal. No murmurs, rubs, or gallops.  ABDOMEN: Soft, nontender, nondistended. Bowel sounds present. No organomegaly or mass.  EXTREMITIES bilateral BKA NEUROLOGIC: Cranial nerves II through XII are intact. No focal Motor or sensory deficits b/l.   PSYCHIATRIC:  patient is alert and oriented x 3.  SKIN: No obvious rash, lesion, or ulcer.   LABORATORY PANEL:  CBC Recent Labs  Lab 07/01/19 0640  WBC 31.2*  HGB 9.0*  HCT 30.3*  PLT 175    Chemistries  Recent Labs  Lab 06/25/19 1235 06/26/19 0414  06/30/19 0956  NA  --  133*   < > 133*  K  --  3.7   < > 4.0  CL  --  93*   < > 95*  CO2  --  25   < > 21*  GLUCOSE  --  216*   < > 169*  BUN  --  54*   < > 47*  CREATININE  --  7.84*   < > 8.06*  CALCIUM  --  7.5*   < > 7.8*  MG  --  1.9  --   --   AST 24  --   --   --   ALT 19  --   --   --   ALKPHOS 74  --   --   --   BILITOT 0.7  --   --   --    < > = values in this interval not displayed.   Cardiac Enzymes No results for input(s): TROPONINI in the last  168 hours. RADIOLOGY:  No results found. ASSESSMENT AND PLAN:  Patient is a 73 year old female with history of end-stage renal disease on peritoneal dialysis,chronic combined systolic and diastolic CHF,peripheral artery disease status post bilateral BKA,diabetes mellitus,CLL on chronic atrial fibrillation on anticoagulation with Eliquis who presented to the emergency room with complaints of chest pain,diarrhea and abdominal discomfort  *Chest pain Concerning for unstable angina--resolved Cardiology consult with dr Rockey Situ Cath held due to drop in HB intially. Heparin stopped. Hb now stabilized Stool negative for blood Eliquisnow resumed Stress testnegative with EF 26 % (cardiomyopathy) per cardiolog-- Imdur added, cont plavix and atorvastatin. D/c  amlodipine and losartan  *Diarrhea and abdominal discomfort Ongoing for the last 2 days. Patient has chronic leukocytosis due to underlying CLL. C diff Negative Abd pain- Discussed with Dr. Holley Raring. No concern for SBP as PD fluid is clear. -Patient to get perm cath placed for transitioning to hemodialysis till she goes to rehab.  *Diabetes mellitus type 2 -Home insulin dosing changed due to sugars very well controlled and want to avoid hypoglycemia Placed on sliding scale insulin coverage.  *End-stage renal disease on peritoneal dialysis--now converted to HD since pt will be going to rehab  *chronic systolic CHF ef 26 % on Myoview -Cont Diuretics and pt currently appears euvolemic. Mild edema on the amputation stump  *Chronic atrial fibrillation Rate controlled. Continue home dose of Eliquis  *Peripheral artery disease Patient status post bilateral BKA  * Anemia of chronic disease. Stable. Baseline 9.5-10  Palliative care to follow after discharge.  Patient will discharged to rehab once- she gets dialysis and has outpatient chair time for dialysis.  Case discussed with Care Management/Social Worker. Management plans discussed with the patient  and they are in agreement.  CODE STATUS: full  DVT Prophylaxis: eliquis  TOTAL TIME TAKING CARE OF THIS PATIENT: *30* minutes.  >50% time spent on counselling and coordination of care  POSSIBLE D/C IN *few* DAYS, DEPENDING ON CLINICAL CONDITION.  Note: This dictation was prepared with Dragon dictation along with smaller phrase technology. Any transcriptional errors that result from this process are unintentional.  Fritzi Mandes M.D on 07/01/2019 at 1:20 PM  Between 7am to 6pm - Pager - [phone]  After 6pm go to [email or web address] - password [email or web address] [place] [facility]  Office  [phone]  CC: Primary care physician; System, Pcp Not InPatient ID: Caroline Hernandez, female   DOB: 09/18/1945, 73 y.o.    MRN: [id number]

## 2019-07-01 NOTE — Progress Notes (Signed)
Pre HD Tx assessment   07/01/19 1415  Neurological  Level of Consciousness Alert  Orientation Level Oriented X4  Respiratory  Respiratory Pattern Regular;Unlabored  Chest Assessment Chest expansion symmetrical  Bilateral Breath Sounds Clear;Diminished  R Upper  Breath Sounds Clear  L Upper Breath Sounds Clear  R Lower Breath Sounds Diminished  L Lower Breath Sounds Diminished  Cough None  Cardiac  Pulse Irregular  Jugular Venous Distention (JVD) No  ECG Monitor Yes  Cardiac Rhythm Atrial fibrillation  Antiarrhythmic device No  Vascular  R Radial Pulse +2  L Radial Pulse +2  Integumentary  Integumentary (WDL) X  Skin Color Appropriate for ethnicity  Musculoskeletal  Musculoskeletal (WDL) X  Generalized Weakness Yes  Gastrointestinal  Bowel Sounds Assessment Active  GU Assessment  Genitourinary (WDL) X  Genitourinary Symptoms Oliguria

## 2019-07-01 NOTE — Progress Notes (Signed)
Post HD Tx assessment   07/01/19 1715  Neurological  Level of Consciousness Alert  Orientation Level Oriented X4  Respiratory  Respiratory Pattern Regular;Unlabored  Chest Assessment Chest expansion symmetrical  Bilateral Breath Sounds Diminished;Clear  R Upper  Breath Sounds Clear  L Upper Breath Sounds Clear  R Lower Breath Sounds Clear;Diminished  L Lower Breath Sounds Clear;Diminished  Cough None  Cardiac  Pulse Irregular  Heart Sounds S1, S2  Jugular Venous Distention (JVD) No  ECG Monitor Yes  Cardiac Rhythm Atrial fibrillation  Antiarrhythmic device No  Vascular  R Radial Pulse +2  L Radial Pulse +2  Integumentary  Integumentary (WDL) X  Skin Color Appropriate for ethnicity  Musculoskeletal  Musculoskeletal (WDL) X  Generalized Weakness Yes  Gastrointestinal  Bowel Sounds Assessment Active  GU Assessment  Genitourinary (WDL) X  Genitourinary Symptoms Oliguria

## 2019-07-01 NOTE — TOC Progression Note (Signed)
Transition of Care [facility]) - Progression Note    Patient Details  Name: Suheyla Mortellaro MRN: [id number] Date of Birth: 1946/03/10  Transition of Care [facility]) CM/SW Contact  [name], RN Phone Number: 07/01/2019, 9:31 AM  Clinical Narrative:   Ubaldo Glassing with DSS/APS called this morning [phone].  Updated her on patients discharge disposition to [facility] when medically ready.  Will notify her when patient discharges.      Expected Discharge Plan: [place] Barriers to Discharge: Continued Medical Work up  Expected Discharge Plan and Services Expected Discharge Plan: [place]   Discharge Planning Services: CM Consult Post Acute Care Choice: [place] arrangements for the past 2 months: Single Family Home Expected Discharge Date: 06/29/19                         HH Arranged: RN, PT, Nurse's Aide, Social Work, OT [place] Agency: [place] Date [place]: 06/27/19 Time [place]: 1021 Representative spoke with at [place]: [place] ([place]) Interventions    Readmission Risk Interventions Readmission Risk Prevention Plan 06/27/2019 06/27/2019 02/18/2019  Transportation Screening - Complete Complete  Medication Review Press photographer) - Complete -  PCP or Specialist appointment within 3-5 days of discharge - Complete Complete  HRI or [place] - Complete (No Data)  SW Recovery Care/Counseling Consult - Complete (No Data)  Palliative Care Screening - Complete Not [place] Not Applicable Not Complete Not Applicable  SNF Comments - pending PT eval -  Some recent data might be hidden

## 2019-07-01 NOTE — Progress Notes (Signed)
HD Tx completed   07/01/19 1700  Vital Signs  Pulse Rate 72  Resp 17  BP (!) 110/59  Oxygen Therapy  SpO2 100 %  O2 Device Nasal Cannula  O2 Flow Rate (L/min) 2 L/min  Pain Assessment  Pain Scale 0-10  Pain Score 0  During Hemodialysis Assessment  Blood Flow Rate (mL/min) 300 mL/min  Arterial Pressure (mmHg) -120 mmHg  Venous Pressure (mmHg) 120 mmHg  Transmembrane Pressure (mmHg) 50 mmHg  Ultrafiltration Rate (mL/min) 830 mL/min  Dialysate Flow Rate (mL/min) 80 ml/min  Conductivity: Machine  13.8  HD Safety Checks Performed Yes  Intra-Hemodialysis Comments Tx completed

## 2019-07-01 NOTE — Plan of Care (Signed)
  Problem: Clinical Measurements: Goal: Ability to maintain clinical measurements within normal limits will improve Outcome: Progressing Goal: Will remain free from infection Outcome: Progressing Goal: Diagnostic test results will improve Outcome: Progressing Goal: Respiratory complications will improve Outcome: Progressing Goal: Cardiovascular complication will be avoided Outcome: Progressing   Problem: Education: Goal: Knowledge of General Education information will improve Description: Including pain rating scale, medication(s)/side effects and non-pharmacologic comfort measures Outcome: Progressing  Pt tolerated well the HD Tx. Spoke with the pt about the importance of HD compliance in the outpt setting

## 2019-07-01 NOTE — Progress Notes (Signed)
[facility]  ROUNDING NOTE   Subjective:   Back up hemodialysis treatment yesterday. Tolerated treatment well. Via RIJ permcath.  Second treatment for today. Patient to sit in a chair.   Objective:  Vital signs in last 24 hours:  Temp:  [97.5 F (36.4 C)-98.7 F (37.1 C)] 97.7 F (36.5 C) (10/20 0735) Pulse Rate:  [65-83] 83 (10/20 0735) Resp:  [14-25] 19 (10/20 0735) BP: (94-122)/(45-74) 97/49 (10/20 0735) SpO2:  [93 %-100 %] 94 % (10/20 0735) Weight:  [110 kg-110.1 kg] 110 kg (10/20 0209)  Weight change: -5.512 kg Filed Weights   06/30/19 0443 06/30/19 2100 07/01/19 0209  Weight: 115.6 kg 110.1 kg 110 kg    Intake/Output: I/O last 3 completed shifts: In: -  Out: 1100 [Urine:100; Other:1000]   Intake/Output this shift:  Total I/O In: 240 [P.O.:240] Out: -   Physical Exam: General: No acute distress, laying in bed  Head: Normocephalic, atraumatic. Moist oral mucosal membranes  Eyes: Anicteric  Neck: No JVD  Lungs:  Basilar rales   Heart: irregular  Abdomen:  Soft, obese  Extremities: Bilateral BKA, edema +  Neurologic: Awake, alert, following commands  Skin: No lesions  Access: PD catheter, RIJ permcath 10/19 Dr. Lucky Cowboy    Basic Metabolic Panel: Recent Labs  Lab 06/25/19 1128 06/26/19 0414 06/27/19 0840 06/28/19 0643 06/30/19 0956  NA 135 133* 136 136 133*  K 4.4 3.7 4.2 3.8 4.0  CL 91* 93* 94* 93* 95*  CO2 24 25 25 25  21*  GLUCOSE 183* 216* 164* 142* 169*  BUN 57* 54* 52* 47* 47*  CREATININE 8.20* 7.84* 7.66* 7.53* 8.06*  CALCIUM 7.6* 7.5* 7.7* 7.9* 7.8*  MG  --  1.9  --   --   --   PHOS  --  8.8*  --   --   --     Liver Function Tests: Recent Labs  Lab 06/25/19 1235  AST 24  ALT 19  ALKPHOS 74  BILITOT 0.7  PROT 5.3*  ALBUMIN 2.3*   No results for input(s): LIPASE, AMYLASE in the last 168 hours. No results for input(s): AMMONIA in the last 168 hours.  CBC: Recent Labs  Lab 06/26/19 0414 06/26/19 1551 06/27/19 0840  06/28/19 0643 07/01/19 0640  WBC 35.7* 35.0* 35.0* 29.7* 31.2*  NEUTROABS  --   --  6.1 5.1  --   HGB 8.5* 9.3* 9.5* 9.6* 9.0*  HCT 27.8* 30.7* 30.6* 31.3* 30.3*  MCV 85.3 86.2 85.5 84.8 87.6  PLT 180 195 191 206 175    Cardiac Enzymes: No results for input(s): CKTOTAL, CKMB, CKMBINDEX, TROPONINI in the last 168 hours.  BNP: Invalid input(s): POCBNP  CBG: Recent Labs  Lab 06/30/19 1208 06/30/19 1842 06/30/19 2228 07/01/19 0613 07/01/19 0737  GLUCAP 142* 126* 139* 116* 134*    Microbiology: Results for orders placed or performed during the hospital encounter of 06/25/19  SARS CORONAVIRUS 2 (TAT 6-24 HRS) Nasopharyngeal Nasopharyngeal Swab     Status: None   Collection Time: 06/25/19 11:27 AM   Specimen: Nasopharyngeal Swab  Result Value Ref Range Status   SARS Coronavirus 2 NEGATIVE NEGATIVE Final    Comment: (NOTE) SARS-CoV-2 target nucleic acids are NOT DETECTED. The SARS-CoV-2 RNA is generally detectable in upper and lower respiratory specimens during the acute phase of infection. Negative results do not preclude SARS-CoV-2 infection, do not rule out co-infections with other pathogens, and should not be used as the sole basis for treatment or other patient management  decisions. Negative results must be combined with clinical observations, patient history, and epidemiological information. The expected result is Negative. Fact Sheet for Patients: SugarRoll.be Fact Sheet for Healthcare Providers: https://www.woods-mathews.com/ This test is not yet approved or cleared by the [place] FDA and  has been authorized for detection and/or diagnosis of SARS-CoV-2 by FDA under an Emergency Use Authorization (EUA). This EUA will remain  in effect (meaning this test can be used) for the duration of the COVID-19 declaration under Section 56 4(b)(1) of the Act, 21 U.S.C. section 360bbb-3(b)(1), unless the authorization is  terminated or revoked sooner. Performed at [facility], [place] [address] [place], [place] [address]   GI pathogen panel by PCR, stool     Status: None   Collection Time: 06/25/19  1:04 PM   Specimen: STOOL  Result Value Ref Range Status   Plesiomonas shigelloides NOT DETECTED NOT DETECTED Final   Yersinia enterocolitica NOT DETECTED NOT DETECTED Final   Vibrio NOT DETECTED NOT DETECTED Final   Enteropathogenic E coli NOT DETECTED NOT DETECTED Final   E coli (ETEC) LT/ST NOT DETECTED NOT DETECTED Final   E coli 5284 by PCR Not applicable NOT DETECTED Final   Cryptosporidium by PCR NOT DETECTED NOT DETECTED Final   Entamoeba histolytica NOT DETECTED NOT DETECTED Final   Adenovirus F 40/41 NOT DETECTED NOT DETECTED Final   Norovirus GI/GII NOT DETECTED NOT DETECTED Final   Sapovirus NOT DETECTED NOT DETECTED Final    Comment: (NOTE) Performed At: [facility] [place], [place] [id number] Rush Farmer MD VO:[id number]    Vibrio cholerae NOT DETECTED NOT DETECTED Final   Campylobacter by PCR NOT DETECTED NOT DETECTED Final   Salmonella by PCR NOT DETECTED NOT DETECTED Final   E coli (STEC) NOT DETECTED NOT DETECTED Final   Enteroaggregative E coli NOT DETECTED NOT DETECTED Final   Shigella by PCR NOT DETECTED NOT DETECTED Final   Cyclospora cayetanensis NOT DETECTED NOT DETECTED Final   Astrovirus NOT DETECTED NOT DETECTED Final   G lamblia by PCR NOT DETECTED NOT DETECTED Final   Rotavirus A by PCR NOT DETECTED NOT DETECTED Final  C difficile quick scan w PCR reflex     Status: None   Collection Time: 06/25/19  2:55 PM   Specimen: STOOL  Result Value Ref Range Status   C Diff antigen NEGATIVE NEGATIVE Final   C Diff toxin NEGATIVE NEGATIVE Final   C Diff interpretation No C. difficile detected.  Final    Comment: Performed at [facility], [address] [place], [place] [address]  OVA + PARASITE EXAM     Status: None   Collection Time:  06/25/19  2:55 PM   Specimen: STOOL  Result Value Ref Range Status   OVA + PARASITE EXAM Final report  Final    Comment: (NOTE) These results were obtained using wet preparation(s) and trichrome stained smear. This test does not include testing for Cryptosporidium parvum, Cyclospora, or Microsporidia. Performed At: [place], [place] [id number] Caprice Red MD IR:[id number]    Source of Sample STOOL  Final    Comment: Performed at [facility], [address] [place], [place] [address]  MRSA PCR Screening     Status: None   Collection Time: 06/26/19 10:37 PM   Specimen: Nasopharyngeal  Result Value Ref Range Status   MRSA by PCR NEGATIVE NEGATIVE Final    Comment:  The GeneXpert MRSA Assay (FDA approved for NASAL specimens only), is one component of a comprehensive MRSA colonization surveillance program. It is not intended to diagnose MRSA infection nor to guide or monitor treatment for MRSA infections. Performed at [facility], [address], [place], [place] [address]   Body fluid culture     Status: None   Collection Time: 06/27/19 10:20 AM   Specimen: Peritoneal Washings; Body Fluid  Result Value Ref Range Status   Specimen Description   Final    PERITONEAL Performed at [facility], [address] [place], [place] [address]    Special Requests   Final    Normal Performed at [facility], [place], [place] [address]    Gram Stain NO WBC SEEN NO ORGANISMS SEEN   Final   Culture   Final    NO GROWTH 3 DAYS Performed at [facility], [place] [address] [place], [place] [address]    Report Status 06/30/2019 FINAL  Final    Coagulation Studies: No results for input(s): LABPROT, INR in the last 72 hours.  Urinalysis: Recent Labs    06/28/19 2354  COLORURINE YELLOW*  LABSPEC 1.013  PHURINE 7.0  GLUCOSEU NEGATIVE  HGBUR SMALL*  BILIRUBINUR NEGATIVE   KETONESUR NEGATIVE  PROTEINUR >=300*  NITRITE NEGATIVE  LEUKOCYTESUR MODERATE*      Imaging: No results found.   Medications:   . sodium chloride 250 mL (07/01/19 0054)  . dialysis solution 2.5% low-MG/low-CA     . amitriptyline  25 mg Oral QHS  . apixaban  5 mg Oral BID  . atorvastatin  40 mg Oral QHS  . Chlorhexidine Gluconate Cloth  6 each Topical Daily  . cholecalciferol  4,000 Units Oral Daily  . clopidogrel  75 mg Oral Daily  . [START ON 07/02/2019] epoetin (EPOGEN/PROCRIT) injection  10,000 Units Intravenous Q M,W,F-HD  . extraneal (ICODEXTRIN) peritoneal dialysis solution   Intraperitoneal Q24H  . gabapentin  100 mg Oral BID  . gentamicin cream  1 application Topical Daily  . insulin aspart  0-5 Units Subcutaneous QHS  . insulin aspart  0-9 Units Subcutaneous TID WC  . insulin aspart  4 Units Subcutaneous TID WC  . insulin glargine  12 Units Subcutaneous Daily  . iron polysaccharides  150 mg Oral Daily  . isosorbide mononitrate  30 mg Oral Daily  . multivitamin-lutein  1 capsule Oral BID  . nystatin   Topical TID  . pantoprazole  40 mg Oral QHS  . sertraline  100 mg Oral QHS  . sevelamer carbonate  2,400 mg Oral TID WC  . vitamin C  500 mg Oral Daily   sodium chloride, acetaminophen, HYDROmorphone (DILAUDID) injection, nitroGLYCERIN, ondansetron (ZOFRAN) IV  Assessment/ Plan:  73 y.o.[place]ntz female with ESRD on PD, bilateral BKA, coronary artery disease status post CABG, chronic systolic heart failure, CLL, diabetes mellitus type 2, hypertension, GERD, hyperlipidemia, ischemic cardiomyopathy, peripheral arterial disease, admitted with chest pain, nausea, and diarrhea.  [facility] Peritoneal dialysis CCPD 9 hours 5 exchanges 3000 fills with last fill of 1049mL of icodextran  1.  ESRD on peritoneal dialysis: tolerated dialysis well last night.  - Transition to hemodialysis for discharge planning. Patient states she will go to [facility] for her  back up hemodialysis treatments.  - Appreciate vascular input, permcath placed 10/19.   2. Hypertension:  - isosorbide mononitrate  3.  Anemia of chronic any disease:  - EPO with HD  treatment  4.  Secondary hyperparathyroidism - sevelamer with meals.    LOS: 5 [place] Frith 10/20/202011:17 AM

## 2019-07-01 NOTE — Progress Notes (Signed)
Hypoglycemic Event  CBG: 60  Treatment: Pt had just started eating her dinner tray. 4oz juice given.   Symptoms: none  Follow-up CBG: Time: 1840 CBG Result: 68  Possible Reasons for Event: decreased PO intake 2/2 HD for 4hrs  Comments/MD notified:via text. No new orders. RN will have rechecked prior to shifts end.     Margarita Mail

## 2019-07-01 NOTE — Progress Notes (Signed)
[facility] Daily Progress Note   Subjective: 1 Day Post-Op: 1. Ultrasound guidance for vascular access to the right internal jugular vein and right external jugular vein 2. Right jugular venogram and superior venacavogram 3. Fluoroscopic guidance for placement of catheter 4. Placement of a 19 cm tip to cuff tunneled hemodialysis catheter via the right external jugular vein  Patient without complaint this AM. No issues overnight.   Objective: Vitals:   06/30/19 2100 07/01/19 0209 07/01/19 0358 07/01/19 0735  BP:   (!) 96/56 (!) 97/49  Pulse:   80 83  Resp:    19  Temp:   98.1 F (36.7 C) 97.7 F (36.5 C)  TempSrc:    Oral  SpO2:   94% 94%  Weight: 110.1 kg 110 kg    Height:        Intake/Output Summary (Last 24 hours) at 07/01/2019 1055 Last data filed at 07/01/2019 0900 Gross per 24 hour  Intake 240 ml  Output 1000 ml  Net -760 ml   Physical Exam: A&Ox3, NAD Chest:   Right IJ Permcath: Intact. Clean and dry.  CV: RRR Pulmonary: CTA Bilaterally Abdomen: Soft, Nontender, Nondistended  PD Catheter: Intact. Clean and dry.   Laboratory: CBC    Component Value Date/Time   WBC 31.2 (H) 07/01/2019 0640   HGB 9.0 (L) 07/01/2019 0640   HGB 12.7 10/02/2016 1442   HCT 30.3 (L) 07/01/2019 0640   HCT 40.5 10/02/2016 1442   PLT 175 07/01/2019 0640   PLT 193 10/02/2016 1442   BMET    Component Value Date/Time   NA 133 (L) 06/30/2019 0956   NA 142 10/07/2018 1425   K 4.0 06/30/2019 0956   CL 95 (L) 06/30/2019 0956   CO2 21 (L) 06/30/2019 0956   GLUCOSE 169 (H) 06/30/2019 0956   BUN 47 (H) 06/30/2019 0956   BUN 39 (H) 10/07/2018 1425   CREATININE 8.06 (H) 06/30/2019 0956   CALCIUM 7.8 (L) 06/30/2019 0956   GFRNONAA 5 (L) 06/30/2019 0956   GFRAA 5 (L) 06/30/2019 0956   Assessment/Planning: The patient is a 73 year old female with a known history of end-stage renal disease - POD#1 permcath insertion 1) patient will undergo first dialysis  treatment through PermCath today.  If not functioning appropriately please contact otherwise vascular to sign off at this time.  Discussed with Dr. Ellis Parents Jamyah Folk PA-C 07/01/2019 10:55 AM

## 2019-07-01 NOTE — Progress Notes (Signed)
Physical Therapy Treatment Patient Details Name: Caroline Hernandez MRN: [id number] DOB: 09-Mar-1946 Today's Date: 07/01/2019    History of Present Illness Caroline Hernandez  is a 73 y.o. female with a known history of end-stage renal disease on peritoneal dialysis, chronic combined systolic and diastolic CHF, peripheral artery disease status post bilateral BKA, diabetes mellitus, CLL on chronic atrial fibrillation on anticoagulation with Eliquis who presented to the emergency room with complaints of chest pain.  Pt status post right external jugular vein perm cath placement for HD 06/30/19 with cont at transfer orders received.    PT Comments    Pt presented with deficits in strength, transfers, mobility, gait, balance, and activity tolerance.  Pt required total assist with bed mobility tasks and once in sitting required assist to come to and maintain position.  Pt's static sitting balance grossly improved during the session to where she could maintain neutral sitting position with only occasional min A and verbal cues to correct posture.  Pt motivated to return to her PLOF and actively participated throughout the session.  Pt will benefit from PT services in a SNF setting upon discharge to safely address above deficits for decreased caregiver assistance and eventual return to PLOF.     Follow Up Recommendations  SNF     Equipment Recommendations  Other (comment)(TBD at next venue of care)    Recommendations for Other Services       Precautions / Restrictions Precautions Precautions: Fall Restrictions Weight Bearing Restrictions: No Other Position/Activity Restrictions: B BKA with prostheses in room    Mobility  Bed Mobility Overal bed mobility: Needs Assistance Bed Mobility: Rolling;Supine to Sit;Sit to Supine Rolling: +2 for physical assistance;Mod assist   Supine to sit: +2 for physical assistance;Mod assist;HOB elevated Sit to supine: +2 for physical assistance;Mod assist;HOB  elevated   General bed mobility comments: +2 Mod A for trunk and BLE control  Transfers                 General transfer comment: NT, pt's liners for prostheses were home getting washed  Ambulation/Gait                 Stairs             Wheelchair Mobility    Modified Rankin (Stroke Patients Only)       Balance Overall balance assessment: Needs assistance Sitting-balance support: Bilateral upper extremity supported Sitting balance-Leahy Scale: Poor   Postural control: Right lateral lean;Posterior lean                                  Cognition Arousal/Alertness: Awake/alert Behavior During Therapy: WFL for tasks assessed/performed Overall Cognitive Status: Within Functional Limits for tasks assessed                                        Exercises Total Joint Exercises Quad Sets: Strengthening;AROM;Both;10 reps;15 reps Gluteal Sets: Strengthening;Both;10 reps Short Arc Quad: Strengthening;Both;10 reps Hip ABduction/ADduction: Strengthening;Both;10 reps;Other (comment)(with manual resistance) Straight Leg Raises: Strengthening;Both;10 reps;Other (comment)(with manual resistance) Long Arc Quad: Strengthening;Both;10 reps;Other (comment)(with manual resistance) Knee Flexion: Strengthening;Both;10 reps;Other (comment)(with manual resistance) Other Exercises Other Exercises: HEP education for BLE QS x 10 each every 1-2 hours daily Other Exercises: Positioning education to promote B knee ext PROM in bed Other Exercises: Sitting balance/core training at EOB with  UE support and trunk movement in various planes    General Comments        Pertinent Vitals/Pain Pain Assessment: No/denies pain    Home Living                      Prior Function            PT Goals (current goals can now be found in the care plan section) Progress towards PT goals: Progressing toward goals    Frequency    Min  2X/week      PT Plan Current plan remains appropriate    Co-evaluation              AM-PAC PT "6 Clicks" Mobility   Outcome Measure  Help needed turning from your back to your side while in a flat bed without using bedrails?: Total Help needed moving from lying on your back to sitting on the side of a flat bed without using bedrails?: Total Help needed moving to and from a bed to a chair (including a wheelchair)?: Total Help needed standing up from a chair using your arms (e.g., wheelchair or bedside chair)?: Total Help needed to walk in hospital room?: Total Help needed climbing 3-5 steps with a railing? : Total 6 Click Score: 6    End of Session Equipment Utilized During Treatment: Oxygen Activity Tolerance: Patient tolerated treatment well Patient left: in bed;with call bell/phone within reach;with bed alarm set Nurse Communication: Mobility status;Other (comment)(Pt required new pure wick) PT Visit Diagnosis: Muscle weakness (generalized) (M62.81);Difficulty in walking, not elsewhere classified (R26.2)     Time: 2778-2423 PT Time Calculation (min) (ACUTE ONLY): 26 min  Charges:  $Therapeutic Exercise: 8-22 mins $Therapeutic Activity: 8-22 mins                     D. Scott Reiner Loewen PT, DPT 07/01/19, 10:56 AM

## 2019-07-01 NOTE — Progress Notes (Signed)
HD Tx started    07/01/19 1425  Vital Signs  Pulse Rate 71  Resp 16  BP (!) 104/56  Oxygen Therapy  SpO2 100 %  O2 Device Nasal Cannula  O2 Flow Rate (L/min) 2 L/min  Pain Assessment  Pain Scale 0-10  Pain Score 0  During Hemodialysis Assessment  Blood Flow Rate (mL/min) 300 mL/min  Arterial Pressure (mmHg) -90 mmHg  Venous Pressure (mmHg) 110 mmHg  Transmembrane Pressure (mmHg) 50 mmHg  Ultrafiltration Rate (mL/min) 830 mL/min  Dialysate Flow Rate (mL/min) 800 ml/min  Conductivity: Machine  14  HD Safety Checks Performed Yes  Dialysis Fluid Bolus Normal Saline  Bolus Amount (mL) 250 mL  Intra-Hemodialysis Comments Tx initiated

## 2019-07-01 NOTE — Progress Notes (Signed)
Post HD Tx Note Pt tolerated well the tx. Prescribed UF goal was met; 2500. Pt had 3 hrs Tx as prescribed. Pt reported no chest pain or SOB post Tx. CVC patent heparin locked closed and clamped. Report was given to primary RN   07/01/19 1715  Hand-Off documentation  Report given to (Full Name) Thomas Hoff RN   Report received from (Full Name) Newt Minion RN   Vital Signs  Temp 98.6 F (37 C)  Temp Source Oral  Pulse Rate 82  Resp 19  BP (!) 106/47  Oxygen Therapy  SpO2 100 %  O2 Device Nasal Cannula  O2 Flow Rate (L/min) 2 L/min  Pain Assessment  Pain Scale 0-10  Pain Score 0  Post-Hemodialysis Assessment  Rinseback Volume (mL) 250 mL  KECN 52.7 V  Dialyzer Clearance Lightly streaked  Duration of HD Treatment -hour(s) 3 hour(s)  Hemodialysis Intake (mL) 500 mL  UF Total -Machine (mL) 2506 mL  Net UF (mL) 2006 mL  Tolerated HD Treatment Yes  Hemodialysis Catheter Right Internal jugular Double lumen Permanent (Tunneled)  Placement Date/Time: 06/30/19 1334   Time Out: Correct patient;Correct site;Correct procedure  Maximum sterile barrier precautions: Hand hygiene;Cap;Mask;Sterile gown;Large sterile sheet;Sterile gloves  Site Prep: Chlorhexidine (preferred);Skin Prep C...  Site Condition No complications  Blue Lumen Status Heparin locked  Red Lumen Status Heparin locked  Purple Lumen Status N/A  Catheter fill solution Heparin 1000 units/ml  Catheter fill volume (Arterial) 2.2 cc  Catheter fill volume (Venous) 2.2  Dressing Type Biopatch  Dressing Status Clean;Dry;Intact  Interventions New dressing;Dressing changed  Dressing Change Due 07/06/19  Post treatment catheter status Capped and Clamped

## 2019-07-01 NOTE — Progress Notes (Signed)
Pre HD Note Pt arrived from her to receive her second HD Tx after right perm cath placement on 06/30/2019. Pt reports no chest or SOB. BP is WDL to start Tx. 3K 2.5Ca for 3 hours BFR 300   07/01/19 1415  Hand-Off documentation  Report given to (Full Name) Beatris Ship RN   Report received from (Full Name) Thomas Hoff RN  Vital Signs  Temp 98.2 F (36.8 C)  Temp Source Oral  Pulse Rate 70  Pulse Rate Source Monitor  Resp 14  BP (!) 104/56  BP Location Right Arm  BP Method Automatic  Patient Position (if appropriate) Lying  Oxygen Therapy  SpO2 100 %  O2 Device Nasal Cannula  O2 Flow Rate (L/min) 2 L/min  Pain Assessment  Pain Scale 0-10  Pain Score 0  Machine Checks  Machine Number 3  Station Number 1  UF/Alarm Test Passed  Conductivity: Meter 14  Conductivity: Machine  14  pH 7.2  Reverse Osmosis Main  Normal Saline Lot Number [id number]  Dialyzer Lot Number [id number]  Disposable Set Lot Number [id number]  Machine Temperature 98.6 F (37 C)  Musician and Audible Yes  Blood Lines Intact and Secured Yes  Pre Treatment Patient Checks  Vascular access used during treatment Catheter  HD catheter dressing before treatment WDL  Patient is receiving dialysis in a chair Yes  Hepatitis B Surface Antigen Results Negative  Date Hepatitis B Surface Antigen Drawn 06/30/19  Hepatitis B Surface Antibody  (<10)  Date Hepatitis B Surface Antibody Drawn 06/30/19  Hemodialysis Consent Verified Yes  Hemodialysis Standing Orders Initiated Yes  ECG (Telemetry) Monitor On Yes  Prime Ordered Normal Saline  Length of  DialysisTreatment -hour(s) 3 Hour(s)  Dialysis Treatment Comments  (Na140)  Dialyzer Elisio 17H NR  Dialysis Anticoagulant None  Dialysate Flow Ordered 800  Blood Flow Rate Ordered 300 mL/min  Ultrafiltration Goal 2 Liters  Hemodialysis Catheter Right Internal jugular Double lumen Permanent (Tunneled)  Placement Date/Time: 06/30/19 1334   Time Out: Correct  patient;Correct site;Correct procedure  Maximum sterile barrier precautions: Hand hygiene;Cap;Mask;Sterile gown;Large sterile sheet;Sterile gloves  Site Prep: Chlorhexidine (preferred);Skin Prep C...  Site Condition No complications  Blue Lumen Status Infusing;Blood return noted  Red Lumen Status Infusing;Flushed;Blood return noted  Purple Lumen Status N/A  Catheter fill solution Heparin 1000 units/ml  Catheter fill volume (Arterial) 2.2 cc  Catheter fill volume (Venous) 2.2  Dressing Type Biopatch  Dressing Status Clean;Dry;Intact  Interventions New dressing;Dressing changed  Drainage Description None  Dressing Change Due 07/07/19

## 2019-07-02 LAB — GLUCOSE, CAPILLARY
Glucose-Capillary: 106 mg/dL — ABNORMAL HIGH (ref 70–99)
Glucose-Capillary: 115 mg/dL — ABNORMAL HIGH (ref 70–99)

## 2019-07-02 LAB — SARS CORONAVIRUS 2 BY RT PCR (HOSPITAL ORDER, PERFORMED IN ~~LOC~~ HOSPITAL LAB): SARS Coronavirus 2: NEGATIVE

## 2019-07-02 NOTE — Progress Notes (Signed)
Inpatient Diabetes Program Recommendations  AACE/ADA: New Consensus Statement on Inpatient Glycemic Control (2015)  Target Ranges:  Prepandial:   less than 140 mg/dL      Peak postprandial:   less than 180 mg/dL (1-2 hours)      Critically ill patients:  140 - 180 mg/dL   Lab Results  Component Value Date   GLUCAP 106 (H) 07/02/2019   HGBA1C 6.7 (H) 06/26/2019    Review of Glycemic Control Results for BUELAH, RENNIE (MRN [id number]) as of 07/02/2019 10:43  Ref. Range 07/01/2019 07:37 07/01/2019 11:45 07/01/2019 18:15 07/01/2019 18:40 07/01/2019 19:12 07/01/2019 21:29 07/02/2019 07:55  Glucose-Capillary Latest Ref Range: 70 - 99 mg/dL 134 (H)  Novolog 5 units  Lantus 12 units 92  Novolog 4 units meal coverage 60 (L) 68 (L) 107 (H) 141 (H) 106 (H)   Diabetes history: DM 2 Outpatient Diabetes medications: Lantus 15 units bid, Novolog meal coverage (did not take it recently glucose trends lower) Current orders for Inpatient glycemic control:  Lantus 12 units Daily Novolog 0-9 units tid + hs Novolog 4 units tid meal coverage  Inpatient Diabetes Program Recommendations:    Hypoglycemia after meal coverage given. Consider decreasing Novolog meal coverage to 2 units.  Thanks,  Tama Headings RN, MSN, BC-ADM Inpatient Diabetes Coordinator Team Pager [phone] (8a-5p)

## 2019-07-02 NOTE — Discharge Summary (Signed)
[facility] NAME: Caroline Hernandez    MR#:  [id number]  DATE OF BIRTH:  1946/03/10  DATE OF ADMISSION:  06/25/2019 ADMITTING PHYSICIAN: Jude Ojie, MD  DATE OF DISCHARGE: 07/02/2019  PRIMARY CARE PHYSICIAN: System, Pcp Not In    ADMISSION DIAGNOSIS:  End stage renal disease on dialysis ([place]) [N18.6, Z99.2] Nonspecific chest pain [R07.9]  DISCHARGE DIAGNOSIS:  Chest pain--resolved ESRD on HD now DM-2 Chronic afib on oral eliquis Anemia of chronic disease  SECONDARY DIAGNOSIS:   Past Medical History:  Diagnosis Date  . Amputation of left lower extremity below knee ([place])   . Amputation of right lower extremity below knee ([place])   . CAD (coronary artery disease)    a. 2004 Cardiac Arrest/CABG x 3 (LIMA->LAD, VG->OM, VG->RCA);  b. 06/2015 lexiscan MV: no significant ischemia, EF 48%, low risk->Med Rx.  . Chronic combined systolic and diastolic CHF (congestive heart failure) ([place])    a. 12/2014 Echo: EF 45-50%;  b. 08/2015 Echo: EF 45-50%, ant, antsept HK, mildly dil LA, nl RV, mild-mod TR, sev PAH (90mmHg); b. 08/2017 TEE: EF 40-45%, large PFO w/ L->R shunting.  . CKD (chronic kidney disease), stage IV ([place])   . CLL (chronic lymphocytic leukemia) ([place])   . Diabetes mellitus without complication ([place])   . Essential hypertension   . GERD (gastroesophageal reflux disease)   . Hiatal hernia   . History of cardiac arrest    a. 2004.  Marland Kitchen Hyperlipidemia   . Ischemic cardiomyopathy    a. s/p MDT ICD (originally had 1027 lead-->gen change and lead revision ~ 2012 @ [facility]); b. 12/2014 Echo: EF 45-50%;  c.08/2015 Echo: EF 45-50%; d. 08/2017 Echo: EF 40-45%.  Marland Kitchen PAD (peripheral artery disease) ([facility])    a. s/p bilat BKA  . Persistent atrial fibrillation ([place])    a. Dx 12/2014.  CHA2DS2VASc = 6--> warfarin;  b. 09/2015 s/p DCCV-->on amio; c. s/p DCCV 04/25/16; d. 07/2016 s/p RFCA/PVI in setting of recurrent afib despite amio; e. 05/2018  Recurrent afib.    HOSPITAL COURSE:   Patient is a 73 year old female with history of end-stage renal disease on peritoneal dialysis,chronic combined systolic and diastolic CHF,peripheral artery disease status post bilateral BKA,diabetes mellitus,CLL on chronic atrial fibrillation on anticoagulation with Eliquis who presented to the emergency room with complaints of chest pain,diarrhea and abdominal discomfort  *Chest pain Concerning for unstable angina Cardiology consulted with dr Rockey Situ Cath held due to drop in HB intially. Heparin stopped. Hb now stabilized Stool negative for blood Eliquis now resumed Stress test negative with EF 26 % (cardiomyopathy) per cardiolog-- Imdur added, cont plavix and atorvastatin. D/c amlodipine and losartan  *Diarrhea and abdominal discomfort Ongoing for the last 2 days. Patient has chronic leukocytosis due to underlying CLL. C diff Negative Abd pain- Discussed with Dr. Holley Raring. No concern for SBP as PD fluid is clear.  *Diabetes mellitus type 2 Home insulin dosing changed due to sugars very well controlled and want to avoid hypoglycemia Placed on sliding scale insulin coverage.  *End-stage renal disease on peritoneal dialysis--now converted to HD temporarily since patient wants to go to rehab for few weeks.   *chronic systolic CHF ef 26 % on Myoview -Cont Diuretics and pt currently appears euvolemic. Mild edema on the amputation stump  *Chronic atrial fibrillation Rate controlled. Continue home dose of Eliquis  *Peripheral artery disease Patient status post bilateral BKA  * Anemia of chronic disease. Stable. Baseline 9.5-10  Palliative care to follow after discharge. Patient does have a chair time for outpatient hemodialysis. She will discharged to rehab today. CONSULTS OBTAINED:    DRUG ALLERGIES:   Allergies  Allergen Reactions  . Piperacillin Other (See Comments)    Renal failure  . Piperacillin-Tazobactam  In Dex Other (See Comments)    Possible AIN in 2008??? See ID note**PER PT-CAUSED RENAL FAILURE**  . Zosyn [Piperacillin Sod-Tazobactam So] Other (See Comments)    Renal failure    DISCHARGE MEDICATIONS:   Allergies as of 07/02/2019      Reactions   Piperacillin Other (See Comments)   Renal failure   Piperacillin-tazobactam In Dex Other (See Comments)   Possible AIN in 2008??? See ID note**PER PT-CAUSED RENAL FAILURE**   Zosyn [piperacillin Sod-tazobactam So] Other (See Comments)   Renal failure      Medication List    STOP taking these medications   cephALEXin 250 MG capsule Commonly known as: KEFLEX   losartan 100 MG tablet Commonly known as: COZAAR     TAKE these medications   amitriptyline 25 MG tablet Commonly known as: ELAVIL Take 25 mg by mouth at bedtime.   amLODipine 10 MG tablet Commonly known as: NORVASC TAKE 1 TABLET BY MOUTH DAILY   apixaban 5 MG Tabs tablet Commonly known as: Eliquis Take 1 tablet (5 mg total) by mouth 2 (two) times daily.   atorvastatin 40 MG tablet Commonly known as: LIPITOR Take 40 mg by mouth at bedtime.   clopidogrel 75 MG tablet Commonly known as: PLAVIX Take 1 tablet (75 mg) by mouth once daily   gabapentin 100 MG capsule Commonly known as: NEURONTIN Take 1 capsule (100 mg total) by mouth 2 (two) times daily. What changed: when to take this   HM Vitamin D3 100 MCG (4000 UT) Caps Generic drug: Cholecalciferol Take 4,000 Units by mouth daily.   insulin aspart 100 UNIT/ML injection Commonly known as: novoLOG Inject 4 Units into the skin 3 (three) times daily with meals. 10 units into the skin before breakfast then 5 units before lunch then 10 units before supper (evening meal) What changed:   how much to take  when to take this   insulin glargine 100 UNIT/ML injection Commonly known as: LANTUS Inject 0.12 mLs (12 Units total) into the skin daily. What changed:   how much to take  when to take this   iron  polysaccharides 150 MG capsule Commonly known as: NIFEREX Take 150 mg by mouth daily.   isosorbide mononitrate 30 MG 24 hr tablet Commonly known as: IMDUR Take 1 tablet (30 mg total) by mouth daily.   nitroGLYCERIN 0.4 MG SL tablet Commonly known as: NITROSTAT Place 1 tablet (0.4 mg total) under the tongue every 5 (five) minutes x 3 doses as needed for chest pain.   nystatin powder Commonly known as: MYCOSTATIN/NYSTOP Apply topically 3 (three) times daily.   pantoprazole 40 MG tablet Commonly known as: PROTONIX Take 40 mg by mouth at bedtime.   sertraline 100 MG tablet Commonly known as: ZOLOFT Take 100 mg by mouth at bedtime.   sevelamer carbonate 800 MG tablet Commonly known as: RENVELA Take 3 tablets (2,400 mg total) by mouth 3 (three) times daily with meals.   torsemide 20 MG tablet Commonly known as: DEMADEX TAKE 2 TABLETS(40 MG) BY MOUTH TWICE DAILY What changed: See the new instructions.   vitamin C 500 MG tablet Commonly known as: ASCORBIC ACID Take 500 mg by mouth daily.   Viteyes  AREDS Formula/Lutein Caps Take 1 capsule by mouth 2 (two) times daily.       If you experience worsening of your admission symptoms, develop shortness of breath, life threatening emergency, suicidal or homicidal thoughts you must seek medical attention immediately by calling 911 or calling your MD immediately  if symptoms less severe.  You Must read complete instructions/literature along with all the possible adverse reactions/side effects for all the Medicines you take and that have been prescribed to you. Take any new Medicines after you have completely understood and accept all the possible adverse reactions/side effects.   Please note  You were cared for by a hospitalist during your hospital stay. If you have any questions about your discharge medications or the care you received while you were in the hospital after you are discharged, you can call the unit and asked to speak  with the hospitalist on call if the hospitalist that took care of you is not available. Once you are discharged, your primary care physician will handle any further medical issues. Please note that NO REFILLS for any discharge medications will be authorized once you are discharged, as it is imperative that you return to your primary care physician (or establish a relationship with a primary care physician if you do not have one) for your aftercare needs so that they can reassess your need for medications and monitor your lab values. Today   SUBJECTIVE   No dysuria, urgency or fever tolerating po diet  VITAL SIGNS:  Blood pressure (!) 104/59, pulse 77, temperature 98.6 F (37 C), temperature source Oral, resp. rate 18, height 5\' 8"  (1.727 m), weight 108.8 kg, SpO2 98 %.  I/O:    Intake/Output Summary (Last 24 hours) at 07/02/2019 1421 Last data filed at 07/02/2019 1012 Gross per 24 hour  Intake 126.68 ml  Output 2006 ml  Net -1879.32 ml    PHYSICAL EXAMINATION:  GENERAL:  73 y.o.-year-old patient lying in the bed with no acute distress.  EYES: Pupils equal, round, reactive to light and accommodation. No scleral icterus. Extraocular muscles intact.  HEENT: Head atraumatic, normocephalic. Oropharynx and nasopharynx clear.  NECK:  Supple, no jugular venous distention. No thyroid enlargement, no tenderness.  LUNGS: Normal breath sounds bilaterally, no wheezing, rales,rhonchi or crepitation. No use of accessory muscles of respiration. HD catheter+ CARDIOVASCULAR: S1, S2 normal. No murmurs, rubs, or gallops.  ABDOMEN: Soft, non-tender, non-distended. Bowel sounds present. No organomegaly or mass. PD cath + EXTREMITIES: bilateral BKA stump ok NEUROLOGIC: Cranial nerves II through XII are intact. Muscle strength 5/5 in all extremities.  Gait not checked.  PSYCHIATRIC: The patient is alert and oriented x 3.  SKIN: No obvious rash, lesion, or ulcer. Per RN --rash in the perineal area  DATA  REVIEW:   CBC  Recent Labs  Lab 07/01/19 0640  WBC 31.2*  HGB 9.0*  HCT 30.3*  PLT 175    Chemistries  Recent Labs  Lab 06/26/19 0414  06/30/19 0956  NA 133*   < > 133*  K 3.7   < > 4.0  CL 93*   < > 95*  CO2 25   < > 21*  GLUCOSE 216*   < > 169*  BUN 54*   < > 47*  CREATININE 7.84*   < > 8.06*  CALCIUM 7.5*   < > 7.8*  MG 1.9  --   --    < > = values in this interval not displayed.    Microbiology Results  Recent Results (from the past 240 hour(s))  SARS CORONAVIRUS 2 (TAT 6-24 HRS) Nasopharyngeal Nasopharyngeal Swab     Status: None   Collection Time: 06/25/19 11:27 AM   Specimen: Nasopharyngeal Swab  Result Value Ref Range Status   SARS Coronavirus 2 NEGATIVE NEGATIVE Final    Comment: (NOTE) SARS-CoV-2 target nucleic acids are NOT DETECTED. The SARS-CoV-2 RNA is generally detectable in upper and lower respiratory specimens during the acute phase of infection. Negative results do not preclude SARS-CoV-2 infection, do not rule out co-infections with other pathogens, and should not be used as the sole basis for treatment or other patient management decisions. Negative results must be combined with clinical observations, patient history, and epidemiological information. The expected result is Negative. Fact Sheet for Patients: SugarRoll.be Fact Sheet for Healthcare Providers: https://www.woods-mathews.com/ This test is not yet approved or cleared by the [place] FDA and  has been authorized for detection and/or diagnosis of SARS-CoV-2 by FDA under an Emergency Use Authorization (EUA). This EUA will remain  in effect (meaning this test can be used) for the duration of the COVID-19 declaration under Section 56 4(b)(1) of the Act, 21 U.S.C. section 360bbb-3(b)(1), unless the authorization is terminated or revoked sooner. Performed at [facility], [place] [address] [place], [place] [address]   GI pathogen  panel by PCR, stool     Status: None   Collection Time: 06/25/19  1:04 PM   Specimen: STOOL  Result Value Ref Range Status   Plesiomonas shigelloides NOT DETECTED NOT DETECTED Final   Yersinia enterocolitica NOT DETECTED NOT DETECTED Final   Vibrio NOT DETECTED NOT DETECTED Final   Enteropathogenic E coli NOT DETECTED NOT DETECTED Final   E coli (ETEC) LT/ST NOT DETECTED NOT DETECTED Final   E coli 4742 by PCR Not applicable NOT DETECTED Final   Cryptosporidium by PCR NOT DETECTED NOT DETECTED Final   Entamoeba histolytica NOT DETECTED NOT DETECTED Final   Adenovirus F 40/41 NOT DETECTED NOT DETECTED Final   Norovirus GI/GII NOT DETECTED NOT DETECTED Final   Sapovirus NOT DETECTED NOT DETECTED Final    Comment: (NOTE) Performed At: [facility], [place] [id number] Rush Farmer MD EP:[id number]    Vibrio cholerae NOT DETECTED NOT DETECTED Final   Campylobacter by PCR NOT DETECTED NOT DETECTED Final   Salmonella by PCR NOT DETECTED NOT DETECTED Final   E coli (STEC) NOT DETECTED NOT DETECTED Final   Enteroaggregative E coli NOT DETECTED NOT DETECTED Final   Shigella by PCR NOT DETECTED NOT DETECTED Final   Cyclospora cayetanensis NOT DETECTED NOT DETECTED Final   Astrovirus NOT DETECTED NOT DETECTED Final   G lamblia by PCR NOT DETECTED NOT DETECTED Final   Rotavirus A by PCR NOT DETECTED NOT DETECTED Final  C difficile quick scan w PCR reflex     Status: None   Collection Time: 06/25/19  2:55 PM   Specimen: STOOL  Result Value Ref Range Status   C Diff antigen NEGATIVE NEGATIVE Final   C Diff toxin NEGATIVE NEGATIVE Final   C Diff interpretation No C. difficile detected.  Final    Comment: Performed at [facility], [address] [place], [place] [address]  OVA + PARASITE EXAM     Status: None   Collection Time: 06/25/19  2:55 PM   Specimen: STOOL  Result Value Ref Range Status   OVA + PARASITE EXAM Final report  Final    Comment:  (NOTE)  These results were obtained using wet preparation(s) and trichrome stained smear. This test does not include testing for Cryptosporidium parvum, Cyclospora, or Microsporidia. Performed At: [place], [place] [id number] Caprice Red MD IO:[id number]    Source of Sample STOOL  Final    Comment: Performed at [facility], [address] [place], [place] [address]  MRSA PCR Screening     Status: None   Collection Time: 06/26/19 10:37 PM   Specimen: Nasopharyngeal  Result Value Ref Range Status   MRSA by PCR NEGATIVE NEGATIVE Final    Comment:        The GeneXpert MRSA Assay (FDA approved for NASAL specimens only), is one component of a comprehensive MRSA colonization surveillance program. It is not intended to diagnose MRSA infection nor to guide or monitor treatment for MRSA infections. Performed at [facility], [address], [place], [place] [address]   Body fluid culture     Status: None   Collection Time: 06/27/19 10:20 AM   Specimen: Peritoneal Washings; Body Fluid  Result Value Ref Range Status   Specimen Description   Final    PERITONEAL Performed at [facility], [address], [place], [place] [address]    Special Requests   Final    Normal Performed at [facility], [place], [place] [address]    Gram Stain NO WBC SEEN NO ORGANISMS SEEN   Final   Culture   Final    NO GROWTH 3 DAYS Performed at [facility], [place] [address] [place], [place] [address]    Report Status 06/30/2019 FINAL  Final  SARS Coronavirus 2 by RT PCR (hospital order, performed in [facility] hospital lab) Nasopharyngeal Nasopharyngeal Swab     Status: None   Collection Time: 07/02/19 11:36 AM   Specimen: Nasopharyngeal Swab  Result Value Ref Range Status   SARS Coronavirus 2 NEGATIVE NEGATIVE Final    Comment: (NOTE) If result is NEGATIVE SARS-CoV-2 target nucleic acids are NOT  DETECTED. The SARS-CoV-2 RNA is generally detectable in upper and lower  respiratory specimens during the acute phase of infection. The lowest  concentration of SARS-CoV-2 viral copies this assay can detect is 250  copies / mL. A negative result does not preclude SARS-CoV-2 infection  and should not be used as the sole basis for treatment or other  patient management decisions.  A negative result may occur with  improper specimen collection / handling, submission of specimen other  than nasopharyngeal swab, presence of viral mutation(s) within the  areas targeted by this assay, and inadequate number of viral copies  (<250 copies / mL). A negative result must be combined with clinical  observations, patient history, and epidemiological information. If result is POSITIVE SARS-CoV-2 target nucleic acids are DETECTED. The SARS-CoV-2 RNA is generally detectable in upper and lower  respiratory specimens dur ing the acute phase of infection.  Positive  results are indicative of active infection with SARS-CoV-2.  Clinical  correlation with patient history and other diagnostic information is  necessary to determine patient infection status.  Positive results do  not rule out bacterial infection or co-infection with other viruses. If result is PRESUMPTIVE POSTIVE SARS-CoV-2 nucleic acids MAY BE PRESENT.   A presumptive positive result was obtained on the submitted specimen  and confirmed on repeat testing.  While 2019 novel coronavirus  (SARS-CoV-2) nucleic acids may be present in the submitted sample  additional confirmatory testing  may be necessary for epidemiological  and / or clinical management purposes  to differentiate between  SARS-CoV-2 and other Sarbecovirus currently known to infect humans.  If clinically indicated additional testing with an alternate test  methodology [phone]) is advised. The SARS-CoV-2 RNA is generally  detectable in upper and lower respiratory sp ecimens during  the acute  phase of infection. The expected result is Negative. Fact Sheet for Patients:  StrictlyIdeas.no Fact Sheet for Healthcare Providers: BankingDealers.co.za This test is not yet approved or cleared by the [place] FDA and has been authorized for detection and/or diagnosis of SARS-CoV-2 by FDA under an Emergency Use Authorization (EUA).  This EUA will remain in effect (meaning this test can be used) for the duration of the COVID-19 declaration under Section 564(b)(1) of the Act, 21 U.S.C. section 360bbb-3(b)(1), unless the authorization is terminated or revoked sooner. Performed at [facility], [address] [place], [place] [address]     RADIOLOGY:  No results found.   CODE STATUS:     Code Status Orders  (From admission, onward)         Start     Ordered   06/25/19 1257  Full code  Continuous     06/25/19 1257        Code Status History    Date Active Date Inactive Code Status Order ID Comments User Context   02/16/2019 0003 02/18/2019 1843 Full Code [id number]  Mansy, Arvella Merles, MD ED   11/19/2018 1242 11/23/2018 2255 Full Code [id number]  Gladstone Lighter, MD Inpatient   11/18/2018 2203 11/19/2018 1242 DNR [id number]  Demetrios Loll, MD ED   10/16/2018 0650 10/31/2018 1826 Full Code [id number]  Sedalia Muta, MD ED   04/14/2018 1701 04/19/2018 1446 Full Code [id number]  Salary, Avel Peace, MD Inpatient   03/07/2018 1058 03/10/2018 1548 DNR [id number]  [name], MD ED   07/18/2016 1718 07/19/2016 1717 Full Code [id number]  Thompson Grayer, MD Inpatient   06/12/2015 1810 06/14/2015 1928 Full Code [id number]  Loletha Grayer, MD ED   Advance Care Planning Activity    Advance Directive Documentation     Most Recent Value  Type of Advance Directive  Healthcare Power of Attorney  Pre-existing out of facility DNR order (yellow form or pink MOST form)  -  "MOST" Form in Place?  -      TOTAL TIME TAKING CARE OF THIS PATIENT:  *40* minutes.    Fritzi Mandes M.D on 07/02/2019 at 2:21 PM  Between 7am to 6pm - Pager - [phone] After 6pm go to [email or web address] - password [email or web address] [facility]  Office  [phone]  CC: Primary care physician; System, Pcp Not In

## 2019-07-02 NOTE — Progress Notes (Signed)
Caroline Hernandez to be D/C'd [employer] per MD order.  Discussed prescriptions and follow up appointments with the patient. Prescriptions given to patient, medication list explained in detail. Pt verbalized understanding.Report given to Archivist at [facility] care.  Allergies as of 07/02/2019      Reactions   Piperacillin Other (See Comments)   Renal failure   Piperacillin-tazobactam In Dex Other (See Comments)   Possible AIN in 2008??? See ID note**PER PT-CAUSED RENAL FAILURE**   Zosyn [piperacillin Sod-tazobactam So] Other (See Comments)   Renal failure      Medication List    STOP taking these medications   cephALEXin 250 MG capsule Commonly known as: KEFLEX   losartan 100 MG tablet Commonly known as: COZAAR     TAKE these medications   amitriptyline 25 MG tablet Commonly known as: ELAVIL Take 25 mg by mouth at bedtime.   amLODipine 10 MG tablet Commonly known as: NORVASC TAKE 1 TABLET BY MOUTH DAILY   apixaban 5 MG Tabs tablet Commonly known as: Eliquis Take 1 tablet (5 mg total) by mouth 2 (two) times daily.   atorvastatin 40 MG tablet Commonly known as: LIPITOR Take 40 mg by mouth at bedtime.   clopidogrel 75 MG tablet Commonly known as: PLAVIX Take 1 tablet (75 mg) by mouth once daily   gabapentin 100 MG capsule Commonly known as: NEURONTIN Take 1 capsule (100 mg total) by mouth 2 (two) times daily. What changed: when to take this   HM Vitamin D3 100 MCG (4000 UT) Caps Generic drug: Cholecalciferol Take 4,000 Units by mouth daily.   insulin aspart 100 UNIT/ML injection Commonly known as: novoLOG Inject 4 Units into the skin 3 (three) times daily with meals. 10 units into the skin before breakfast then 5 units before lunch then 10 units before supper (evening meal) What changed:   how much to take  when to take this   insulin glargine 100 UNIT/ML injection Commonly known as: LANTUS Inject 0.12 mLs (12 Units total) into the skin  daily. What changed:   how much to take  when to take this   iron polysaccharides 150 MG capsule Commonly known as: NIFEREX Take 150 mg by mouth daily.   isosorbide mononitrate 30 MG 24 hr tablet Commonly known as: IMDUR Take 1 tablet (30 mg total) by mouth daily.   nitroGLYCERIN 0.4 MG SL tablet Commonly known as: NITROSTAT Place 1 tablet (0.4 mg total) under the tongue every 5 (five) minutes x 3 doses as needed for chest pain.   nystatin powder Commonly known as: MYCOSTATIN/NYSTOP Apply topically 3 (three) times daily.   pantoprazole 40 MG tablet Commonly known as: PROTONIX Take 40 mg by mouth at bedtime.   sertraline 100 MG tablet Commonly known as: ZOLOFT Take 100 mg by mouth at bedtime.   sevelamer carbonate 800 MG tablet Commonly known as: RENVELA Take 3 tablets (2,400 mg total) by mouth 3 (three) times daily with meals.   torsemide 20 MG tablet Commonly known as: DEMADEX TAKE 2 TABLETS(40 MG) BY MOUTH TWICE DAILY What changed: See the new instructions.   vitamin C 500 MG tablet Commonly known as: ASCORBIC ACID Take 500 mg by mouth daily.   Viteyes AREDS Formula/Lutein Caps Take 1 capsule by mouth 2 (two) times daily.       Vitals:   07/02/19 0753 07/02/19 1515  BP: (!) 104/59 113/61  Pulse: 77 82  Resp: 18 18  Temp: 98.6 F (37 C) 98 F (36.7  C)  SpO2: 98% 100%    Tele box removed and returned.Skin clean, dry and intact without evidence of skin break down, no evidence of skin tears noted. IV catheter discontinued intact. Site without signs and symptoms of complications. Dressing and pressure applied. Pt denies pain at this time. No complaints noted.  An After Visit Summary was printed and given to the patient. Patient escorted via stretcher, and D/C home via EMS.  Rolley Sims

## 2019-07-02 NOTE — Progress Notes (Signed)
[place] [facility] [place] NAME: Caroline Hernandez    MR#:  [id number]  DATE OF BIRTH:  05-29-1946  SUBJECTIVE:  patient denies any complaints. S/p HD cath placement with HD started. tolerating well no new complaints.  REVIEW OF SYSTEMS:   Review of Systems  Constitutional: Negative for chills, fever and weight loss.  HENT: Negative for ear discharge, ear pain and nosebleeds.   Eyes: Negative for blurred vision, pain and discharge.  Respiratory: Negative for sputum production, shortness of breath, wheezing and stridor.   Cardiovascular: Negative for chest pain, palpitations, orthopnea and PND.  Gastrointestinal: Negative for abdominal pain, diarrhea, nausea and vomiting.  Genitourinary: Negative for frequency and urgency.  Musculoskeletal: Negative for back pain and joint pain.  Neurological: Positive for weakness. Negative for sensory change, speech change and focal weakness.  Psychiatric/Behavioral: Negative for depression and hallucinations. The patient is not nervous/anxious.    Tolerating Diet:yes Tolerating PT: rehab  DRUG ALLERGIES:   Allergies  Allergen Reactions  . Piperacillin Other (See Comments)    Renal failure  . 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 See ID note**PER PT-CAUSED RENAL FAILURE**  . Zosyn [Piperacillin Sod-Tazobactam So] Other (See Comments)    Renal failure    VITALS:  Blood pressure (!) 104/59, pulse 77, temperature 98.6 F (37 C), temperature source Oral, resp. rate 18, height 5\' 8"  (1.727 m), weight 108.8 kg, SpO2 98 %.  PHYSICAL EXAMINATION:   Physical Exam  GENERAL:  73 y.o.-year-old patient lying in the bed with no acute distress.  EYES: Pupils equal, round, reactive to light and accommodation. No scleral icterus. Extraocular muscles intact.  HEENT: Head atraumatic, normocephalic. Oropharynx and nasopharynx clear.  NECK:  Supple, no jugular venous  distention. No thyroid enlargement, no tenderness.  LUNGS: Normal breath sounds bilaterally, no wheezing, rales, rhonchi. No use of accessory muscles of respiration.  CARDIOVASCULAR: S1, S2 normal. No murmurs, rubs, or gallops.  ABDOMEN: Soft, nontender, nondistended. Bowel sounds present. No organomegaly or mass.  EXTREMITIES bilateral BKA NEUROLOGIC: Cranial nerves II through XII are intact. No focal Motor or sensory deficits b/l.   PSYCHIATRIC:  patient is alert and oriented x 3.  SKIN: No obvious rash, lesion, or ulcer.   LABORATORY PANEL:  CBC Recent Labs  Lab 07/01/19 0640  WBC 31.2*  HGB 9.0*  HCT 30.3*  PLT 175    Chemistries  Recent Labs  Lab 06/25/19 1235 06/26/19 0414  06/30/19 0956  NA  --  133*   < > 133*  K  --  3.7   < > 4.0  CL  --  93*   < > 95*  CO2  --  25   < > 21*  GLUCOSE  --  216*   < > 169*  BUN  --  54*   < > 47*  CREATININE  --  7.84*   < > 8.06*  CALCIUM  --  7.5*   < > 7.8*  MG  --  1.9  --   --   AST 24  --   --   --   ALT 19  --   --   --   ALKPHOS 74  --   --   --   BILITOT 0.7  --   --   --    < > = values in this interval not displayed.   Cardiac Enzymes No results for input(s): TROPONINI  in the last 168 hours. RADIOLOGY:  No results found. ASSESSMENT AND PLAN:  Patient is a 73 year old female with history of end-stage renal disease on peritoneal dialysis,chronic combined systolic and diastolic CHF,peripheral artery disease status post bilateral BKA,diabetes mellitus,CLL on chronic atrial fibrillation on anticoagulation with Eliquis who presented to the emergency room with complaints of chest pain,diarrhea and abdominal discomfort  *Chest pain Concerning for unstable angina--resolved Cardiology consult with dr Rockey Situ Cath held due to drop in HB intially. Heparin stopped. Hb now stabilized Stool negative for blood Eliquisnow resumed Stress testnegative with EF 26 % (cardiomyopathy) per cardiolog-- Imdur added, cont plavix  and atorvastatin. D/c amlodipine and losartan  *Diarrhea and abdominal discomfort Ongoing for the last 2 days. Patient has chronic leukocytosis due to underlying CLL. C diff Negative Abd pain- Discussed with Dr. Holley Raring. No concern for SBP as PD fluid is clear. -Patient to get perm cath placed for transitioning to hemodialysis till she goes to rehab.  *Diabetes mellitus type 2 -Home insulin dosing changed due to sugars very well controlled and want to avoid hypoglycemia Placed on sliding scale insulin coverage.  *End-stage renal disease on peritoneal dialysis--now converted to HD since pt will be going to rehab  *chronic systolic CHF ef 26 % on Myoview -Cont Diuretics and pt currently appears euvolemic. Mild edema on the amputation stump  *Chronic atrial fibrillation Rate controlled. Continue home dose of Eliquis  *Peripheral artery disease Patient status post bilateral BKA  * Anemia of chronic disease. Stable. Baseline 9.5-10  Palliative care to follow after discharge.  Patient will discharged to rehab once her call me test turns out negative. She does have dialysis chair time which has been arranged by nephrology.  Case discussed with Care Management/Social Worker. Management plans discussed with the patient  and they are in agreement.  CODE STATUS: full  DVT Prophylaxis: eliquis  TOTAL TIME TAKING CARE OF THIS PATIENT: *30* minutes.  >50% time spent on counselling and coordination of care  POSSIBLE D/C IN *few* DAYS, DEPENDING ON CLINICAL CONDITION.  Note: This dictation was prepared with Dragon dictation along with smaller phrase technology. Any transcriptional errors that result from this process are unintentional.  Fritzi Mandes M.D on 07/02/2019 at 12:26 PM  Between 7am to 6pm - Pager - [phone]  After 6pm go to [email or web address] - password [email or web address] [facility]  Office  [phone]  CC: Primary care physician; System, Pcp  Not InPatient ID: Nickisha Hum, female   DOB: 12/03/1945, 73 y.o.   MRN: [id number]

## 2019-07-02 NOTE — Progress Notes (Signed)
[facility]  ROUNDING NOTE   Subjective:   Hemodialysis treatment yesterday. Patient did treatment in a chair. Patient tolerated treatment well. UF of 2 liters.   Objective:  Vital signs in last 24 hours:  Temp:  [97.8 F (36.6 C)-98.6 F (37 C)] 98.6 F (37 C) (10/21 0753) Pulse Rate:  [64-82] 77 (10/21 0753) Resp:  [14-22] 18 (10/21 0753) BP: (96-115)/(46-73) 104/59 (10/21 0753) SpO2:  [95 %-100 %] 98 % (10/21 0753) Weight:  [106.4 kg-108.8 kg] 108.8 kg (10/21 0536)  Weight change: -3.719 kg Filed Weights   07/01/19 0209 07/01/19 1753 07/02/19 0536  Weight: 110 kg 106.4 kg 108.8 kg    Intake/Output: I/O last 3 completed shifts: In: 486.7 [P.O.:480; I.V.:6.7] Out: 3006 [Other:3006]   Intake/Output this shift:  Total I/O In: 120 [P.O.:120] Out: -   Physical Exam: General: No acute distress, laying in bed  Head: Normocephalic, atraumatic. Moist oral mucosal membranes  Eyes: Anicteric  Neck: No JVD  Lungs:  Basilar rales   Heart: irregular  Abdomen:  Soft, obese  Extremities: Bilateral BKA, no edema  Neurologic: Awake, alert, following commands  Skin: Left stump lesion  Access: PD catheter, RIJ permcath 10/19 Dr. Lucky Cowboy    Basic Metabolic Panel: Recent Labs  Lab 06/25/19 1128 06/26/19 0414 06/27/19 0840 06/28/19 0643 06/30/19 0956  NA 135 133* 136 136 133*  K 4.4 3.7 4.2 3.8 4.0  CL 91* 93* 94* 93* 95*  CO2 24 25 25 25  21*  GLUCOSE 183* 216* 164* 142* 169*  BUN 57* 54* 52* 47* 47*  CREATININE 8.20* 7.84* 7.66* 7.53* 8.06*  CALCIUM 7.6* 7.5* 7.7* 7.9* 7.8*  MG  --  1.9  --   --   --   PHOS  --  8.8*  --   --   --     Liver Function Tests: Recent Labs  Lab 06/25/19 1235  AST 24  ALT 19  ALKPHOS 74  BILITOT 0.7  PROT 5.3*  ALBUMIN 2.3*   No results for input(s): LIPASE, AMYLASE in the last 168 hours. No results for input(s): AMMONIA in the last 168 hours.  CBC: Recent Labs  Lab 06/26/19 0414 06/26/19 1551 06/27/19 0840  06/28/19 0643 07/01/19 0640  WBC 35.7* 35.0* 35.0* 29.7* 31.2*  NEUTROABS  --   --  6.1 5.1  --   HGB 8.5* 9.3* 9.5* 9.6* 9.0*  HCT 27.8* 30.7* 30.6* 31.3* 30.3*  MCV 85.3 86.2 85.5 84.8 87.6  PLT 180 195 191 206 175    Cardiac Enzymes: No results for input(s): CKTOTAL, CKMB, CKMBINDEX, TROPONINI in the last 168 hours.  BNP: Invalid input(s): POCBNP  CBG: Recent Labs  Lab 07/01/19 1815 07/01/19 1840 07/01/19 1912 07/01/19 2129 07/02/19 0755  GLUCAP 60* 41* 107* 141* 106*    Microbiology: Results for orders placed or performed during the hospital encounter of 06/25/19  SARS CORONAVIRUS 2 (TAT 6-24 HRS) Nasopharyngeal Nasopharyngeal Swab     Status: None   Collection Time: 06/25/19 11:27 AM   Specimen: Nasopharyngeal Swab  Result Value Ref Range Status   SARS Coronavirus 2 NEGATIVE NEGATIVE Final    Comment: (NOTE) SARS-CoV-2 target nucleic acids are NOT DETECTED. The SARS-CoV-2 RNA is generally detectable in upper and lower respiratory specimens during the acute phase of infection. Negative results do not preclude SARS-CoV-2 infection, do not rule out co-infections with other pathogens, and should not be used as the sole basis for treatment or other patient management decisions. Negative results must  be combined with clinical observations, patient history, and epidemiological information. The expected result is Negative. Fact Sheet for Patients: SugarRoll.be Fact Sheet for Healthcare Providers: https://www.woods-mathews.com/ This test is not yet approved or cleared by the [place] FDA and  has been authorized for detection and/or diagnosis of SARS-CoV-2 by FDA under an Emergency Use Authorization (EUA). This EUA will remain  in effect (meaning this test can be used) for the duration of the COVID-19 declaration under Section 56 4(b)(1) of the Act, 21 U.S.C. section 360bbb-3(b)(1), unless the authorization is terminated  or revoked sooner. Performed at [facility], [place] [address], [place], [place] [address]   GI pathogen panel by PCR, stool     Status: None   Collection Time: 06/25/19  1:04 PM   Specimen: STOOL  Result Value Ref Range Status   Plesiomonas shigelloides NOT DETECTED NOT DETECTED Final   Yersinia enterocolitica NOT DETECTED NOT DETECTED Final   Vibrio NOT DETECTED NOT DETECTED Final   Enteropathogenic E coli NOT DETECTED NOT DETECTED Final   E coli (ETEC) LT/ST NOT DETECTED NOT DETECTED Final   E coli 7564 by PCR Not applicable NOT DETECTED Final   Cryptosporidium by PCR NOT DETECTED NOT DETECTED Final   Entamoeba histolytica NOT DETECTED NOT DETECTED Final   Adenovirus F 40/41 NOT DETECTED NOT DETECTED Final   Norovirus GI/GII NOT DETECTED NOT DETECTED Final   Sapovirus NOT DETECTED NOT DETECTED Final    Comment: (NOTE) Performed At: [facility] [place], [place] [id number] Rush Farmer MD [id number]    Vibrio cholerae NOT DETECTED NOT DETECTED Final   Campylobacter by PCR NOT DETECTED NOT DETECTED Final   Salmonella by PCR NOT DETECTED NOT DETECTED Final   E coli (STEC) NOT DETECTED NOT DETECTED Final   Enteroaggregative E coli NOT DETECTED NOT DETECTED Final   Shigella by PCR NOT DETECTED NOT DETECTED Final   Cyclospora cayetanensis NOT DETECTED NOT DETECTED Final   Astrovirus NOT DETECTED NOT DETECTED Final   G lamblia by PCR NOT DETECTED NOT DETECTED Final   Rotavirus A by PCR NOT DETECTED NOT DETECTED Final  C difficile quick scan w PCR reflex     Status: None   Collection Time: 06/25/19  2:55 PM   Specimen: STOOL  Result Value Ref Range Status   C Diff antigen NEGATIVE NEGATIVE Final   C Diff toxin NEGATIVE NEGATIVE Final   C Diff interpretation No C. difficile detected.  Final    Comment: Performed at [facility], [address] [place], [place] [address]  OVA + PARASITE EXAM     Status: None   Collection Time: 06/25/19   2:55 PM   Specimen: STOOL  Result Value Ref Range Status   OVA + PARASITE EXAM Final report  Final    Comment: (NOTE) These results were obtained using wet preparation(s) and trichrome stained smear. This test does not include testing for Cryptosporidium parvum, Cyclospora, or Microsporidia. Performed At: [place], [place] [id number] Caprice Red MD [id number]    Source of Sample STOOL  Final    Comment: Performed at [facility], [address] [place], [place] [address]  MRSA PCR Screening     Status: None   Collection Time: 06/26/19 10:37 PM   Specimen: Nasopharyngeal  Result Value Ref Range Status   MRSA by PCR NEGATIVE NEGATIVE Final    Comment:        The GeneXpert  MRSA Assay (FDA approved for NASAL specimens only), is one component of a comprehensive MRSA colonization surveillance program. It is not intended to diagnose MRSA infection nor to guide or monitor treatment for MRSA infections. Performed at [facility], [address] [place], [place] [address]   Body fluid culture     Status: None   Collection Time: 06/27/19 10:20 AM   Specimen: Peritoneal Washings; Body Fluid  Result Value Ref Range Status   Specimen Description   Final    PERITONEAL Performed at [facility], [address], [place], [place] [address]    Special Requests   Final    Normal Performed at [facility], [place], [place] [address]    Gram Stain NO WBC SEEN NO ORGANISMS SEEN   Final   Culture   Final    NO GROWTH 3 DAYS Performed at [facility], [place] [address] [place], [place] [address]    Report Status 06/30/2019 FINAL  Final    Coagulation Studies: No results for input(s): LABPROT, INR in the last 72 hours.  Urinalysis: No results for input(s): COLORURINE, LABSPEC, PHURINE, GLUCOSEU, HGBUR, BILIRUBINUR, KETONESUR, PROTEINUR, UROBILINOGEN, NITRITE, LEUKOCYTESUR in the last  72 hours.  Invalid input(s): APPERANCEUR    Imaging: No results found.   Medications:   . sodium chloride Stopped (07/01/19 0206)   . amitriptyline  25 mg Oral QHS  . apixaban  5 mg Oral BID  . atorvastatin  40 mg Oral QHS  . Chlorhexidine Gluconate Cloth  6 each Topical Daily  . cholecalciferol  4,000 Units Oral Daily  . clopidogrel  75 mg Oral Daily  . epoetin (EPOGEN/PROCRIT) injection  10,000 Units Intravenous Q M,W,F-HD  . extraneal (ICODEXTRIN) peritoneal dialysis solution   Intraperitoneal Q24H  . feeding supplement (NEPRO CARB STEADY)  237 mL Oral BID BM  . gabapentin  100 mg Oral BID  . gentamicin cream  1 application Topical Daily  . insulin aspart  0-5 Units Subcutaneous QHS  . insulin aspart  0-9 Units Subcutaneous TID WC  . insulin aspart  4 Units Subcutaneous TID WC  . insulin glargine  12 Units Subcutaneous Daily  . iron polysaccharides  150 mg Oral Daily  . isosorbide mononitrate  30 mg Oral Daily  . multivitamin  1 tablet Oral QHS  . multivitamin-lutein  1 capsule Oral BID  . nystatin   Topical TID  . pantoprazole  40 mg Oral QHS  . sertraline  100 mg Oral QHS  . sevelamer carbonate  2,400 mg Oral TID WC  . vitamin C  500 mg Oral Daily   sodium chloride, acetaminophen, nitroGLYCERIN, ondansetron (ZOFRAN) IV  Assessment/ Plan:  73 y.o.[place] female with ESRD on PD, bilateral BKA, coronary artery disease status post CABG, chronic systolic heart failure, CLL, diabetes mellitus type 2, hypertension, GERD, hyperlipidemia, ischemic cardiomyopathy, peripheral arterial disease, admitted with chest pain, nausea, and diarrhea.  [facility] Peritoneal dialysis CCPD 9 hours 5 exchanges 3000 fills with last fill of 1077mL of icodextran  1.  ESRD on peritoneal dialysis: tolerated dialysis well last night.  - Transition to hemodialysis for discharge planning. Patient states she will go to [facility] for her back up hemodialysis treatments.  - Appreciate  vascular input, permcath placed 10/19.   2. Hypertension:  - isosorbide mononitrate  3.  Anemia of chronic any disease:  - EPO with HD treatment  4.  Secondary hyperparathyroidism - sevelamer with meals.  LOS: 6 Magdalene Tardiff 10/21/202011:18 AM

## 2019-07-02 NOTE — Progress Notes (Signed)
Report called to Metro Kung, Therapist, sports at [facility]. [facility] has been called for report.

## 2019-07-02 NOTE — TOC Transition Note (Signed)
Transition of Care [facility]) - CM/SW Discharge Note   Patient Details  Name: Caroline Hernandez MRN: [id number] Date of Birth: [birth year]  Transition of Care [facility]) CM/SW Contact:  [name], RN Phone Number: 07/02/2019, 1:28 PM   Clinical Narrative:   Patient is discharging to [facility] today via [facility].  Room 7.  RN can call report to [phone].  Patient and daughter [name] Aware.  DC packet placed on chart and RN aware.      Final next level of care: Skilled Nursing Facility Barriers to Discharge: No Barriers Identified   Patient Goals and CMS Choice Patient states their goals for this hospitalization and ongoing recovery are:: Go home with home health-[place] CMS Medicare.gov Compare Post Acute Care list provided to:: Patient Choice offered to / list presented to : Patient  Discharge Placement              Patient chooses bed at: [facility] Patient to be transferred to facility by: [facility] Name of family member notified: [name] Patient and family notified of of transfer: 07/02/19  Discharge Plan and Services   Discharge Planning Services: CM Consult Post Acute Care Choice: Home Health                    HH Arranged: RN, PT, Nurse's Aide, Social Work, OT [facility] Agency: [place] Date [facility] Agency Contacted: 06/27/19 Time [place]: 1021 Representative spoke with at [place]: [place] ([place]) Interventions     Readmission Risk Interventions Readmission Risk Prevention Plan 06/27/2019 06/27/2019 02/18/2019  Transportation Screening - Complete Complete  Medication Review Press photographer) - Complete -  PCP or Specialist appointment within 3-5 days of discharge - Complete Complete  HRI or Home Care Consult - Complete (No Data)  SW Recovery Care/Counseling Consult - Complete (No Data)  Palliative Care Screening - Complete Not Applicable  Skilled Nursing Facility Not Applicable Not Complete Not  Applicable  SNF Comments - pending PT eval -  Some recent data might be hidden

## 2019-07-03 ENCOUNTER — Other Ambulatory Visit: Payer: Self-pay | Admitting: Cardiovascular Disease

## 2019-07-17 ENCOUNTER — Other Ambulatory Visit: Payer: Self-pay

## 2019-07-17 MED ORDER — TORSEMIDE 20 MG PO TABS: ORAL_TABLET | ORAL | 0 refills | Status: DC

## 2019-07-17 NOTE — Telephone Encounter (Signed)
Requested Prescriptions   Signed Prescriptions Disp Refills  . torsemide (DEMADEX) 20 MG tablet 120 tablet 0    Sig: TAKE 2 TABLETS(40 MG) BY MOUTH TWICE DAILY *NEEDS OFFICE VISIT FOR FURTHER REFILLS. PLEASE CALL [phone] TO SCHEDULE.*    Authorizing Provider: Theora Gianotti    Ordering User: Raelene Bott, Wagner Tanzi L

## 2019-08-06 ENCOUNTER — Encounter: Payer: Self-pay | Admitting: *Deleted

## 2019-08-06 ENCOUNTER — Emergency Department: Payer: Medicare HMO

## 2019-08-06 ENCOUNTER — Other Ambulatory Visit: Payer: Self-pay

## 2019-08-06 ENCOUNTER — Observation Stay
Admission: EM | Admit: 2019-08-06 | Discharge: 2019-08-14 | Disposition: A | Discharge status: Skilled Nursing Facility | Payer: Medicare HMO | Attending: Internal Medicine | Admitting: Internal Medicine

## 2019-08-06 DIAGNOSIS — N186 End stage renal disease: Secondary | ICD-10-CM | POA: Diagnosis not present

## 2019-08-06 DIAGNOSIS — Z8674 Personal history of sudden cardiac arrest: Secondary | ICD-10-CM | POA: Diagnosis not present

## 2019-08-06 DIAGNOSIS — Z89512 Acquired absence of left leg below knee: Secondary | ICD-10-CM | POA: Diagnosis not present

## 2019-08-06 DIAGNOSIS — R531 Weakness: Secondary | ICD-10-CM

## 2019-08-06 DIAGNOSIS — M6281 Muscle weakness (generalized): Secondary | ICD-10-CM | POA: Insufficient documentation

## 2019-08-06 DIAGNOSIS — Z7901 Long term (current) use of anticoagulants: Secondary | ICD-10-CM | POA: Insufficient documentation

## 2019-08-06 DIAGNOSIS — K219 Gastro-esophageal reflux disease without esophagitis: Secondary | ICD-10-CM | POA: Diagnosis not present

## 2019-08-06 DIAGNOSIS — Z79899 Other long term (current) drug therapy: Secondary | ICD-10-CM | POA: Insufficient documentation

## 2019-08-06 DIAGNOSIS — Z794 Long term (current) use of insulin: Secondary | ICD-10-CM | POA: Diagnosis not present

## 2019-08-06 DIAGNOSIS — E1122 Type 2 diabetes mellitus with diabetic chronic kidney disease: Secondary | ICD-10-CM | POA: Insufficient documentation

## 2019-08-06 DIAGNOSIS — Z89511 Acquired absence of right leg below knee: Secondary | ICD-10-CM | POA: Diagnosis not present

## 2019-08-06 DIAGNOSIS — I482 Chronic atrial fibrillation, unspecified: Secondary | ICD-10-CM | POA: Insufficient documentation

## 2019-08-06 DIAGNOSIS — I5042 Chronic combined systolic (congestive) and diastolic (congestive) heart failure: Secondary | ICD-10-CM | POA: Diagnosis not present

## 2019-08-06 DIAGNOSIS — N390 Urinary tract infection, site not specified: Secondary | ICD-10-CM | POA: Diagnosis present

## 2019-08-06 DIAGNOSIS — C9110 Chronic lymphocytic leukemia of B-cell type not having achieved remission: Secondary | ICD-10-CM | POA: Insufficient documentation

## 2019-08-06 DIAGNOSIS — Z7902 Long term (current) use of antithrombotics/antiplatelets: Secondary | ICD-10-CM | POA: Insufficient documentation

## 2019-08-06 DIAGNOSIS — R001 Bradycardia, unspecified: Secondary | ICD-10-CM | POA: Diagnosis not present

## 2019-08-06 DIAGNOSIS — E1151 Type 2 diabetes mellitus with diabetic peripheral angiopathy without gangrene: Secondary | ICD-10-CM | POA: Diagnosis not present

## 2019-08-06 DIAGNOSIS — I132 Hypertensive heart and chronic kidney disease with heart failure and with stage 5 chronic kidney disease, or end stage renal disease: Secondary | ICD-10-CM | POA: Insufficient documentation

## 2019-08-06 DIAGNOSIS — I25119 Atherosclerotic heart disease of native coronary artery with unspecified angina pectoris: Secondary | ICD-10-CM | POA: Insufficient documentation

## 2019-08-06 DIAGNOSIS — N3000 Acute cystitis without hematuria: Secondary | ICD-10-CM | POA: Diagnosis not present

## 2019-08-06 DIAGNOSIS — Z992 Dependence on renal dialysis: Secondary | ICD-10-CM | POA: Insufficient documentation

## 2019-08-06 DIAGNOSIS — E785 Hyperlipidemia, unspecified: Secondary | ICD-10-CM | POA: Diagnosis not present

## 2019-08-06 DIAGNOSIS — I959 Hypotension, unspecified: Secondary | ICD-10-CM

## 2019-08-06 DIAGNOSIS — Z20828 Contact with and (suspected) exposure to other viral communicable diseases: Secondary | ICD-10-CM | POA: Insufficient documentation

## 2019-08-06 DIAGNOSIS — I255 Ischemic cardiomyopathy: Secondary | ICD-10-CM | POA: Insufficient documentation

## 2019-08-06 LAB — CBC
HCT: 34.4 % — ABNORMAL LOW (ref 36.0–46.0)
Hemoglobin: 10.4 g/dL — ABNORMAL LOW (ref 12.0–15.0)
MCH: 27.7 pg (ref 26.0–34.0)
MCHC: 30.2 g/dL (ref 30.0–36.0)
MCV: 91.5 fL (ref 80.0–100.0)
Platelets: 182 10*3/uL (ref 150–400)
RBC: 3.76 MIL/uL — ABNORMAL LOW (ref 3.87–5.11)
RDW: 17.6 % — ABNORMAL HIGH (ref 11.5–15.5)
WBC: 25.5 10*3/uL — ABNORMAL HIGH (ref 4.0–10.5)
nRBC: 0 % (ref 0.0–0.2)

## 2019-08-06 LAB — BASIC METABOLIC PANEL
Anion gap: 12 (ref 5–15)
BUN: 9 mg/dL (ref 8–23)
CO2: 25 mmol/L (ref 22–32)
Calcium: 8 mg/dL — ABNORMAL LOW (ref 8.9–10.3)
Chloride: 97 mmol/L — ABNORMAL LOW (ref 98–111)
Creatinine, Ser: 2.96 mg/dL — ABNORMAL HIGH (ref 0.44–1.00)
GFR calc Af Amer: 18 mL/min — ABNORMAL LOW (ref >60)
GFR calc non Af Amer: 15 mL/min — ABNORMAL LOW (ref >60)
Glucose, Bld: 207 mg/dL — ABNORMAL HIGH (ref 70–99)
Potassium: 2.8 mmol/L — ABNORMAL LOW (ref 3.5–5.1)
Sodium: 134 mmol/L — ABNORMAL LOW (ref 135–145)

## 2019-08-06 LAB — URINALYSIS, COMPLETE (UACMP) WITH MICROSCOPIC
Bilirubin Urine: NEGATIVE
Glucose, UA: NEGATIVE mg/dL
Ketones, ur: NEGATIVE mg/dL
Nitrite: NEGATIVE
Protein, ur: 100 mg/dL — AB
RBC / HPF: >50 RBC/hpf — ABNORMAL HIGH (ref 0–5)
Specific Gravity, Urine: 1.006 (ref 1.005–1.030)
Squamous Epithelial / HPF: NONE SEEN (ref 0–5)
WBC, UA: >50 WBC/hpf — ABNORMAL HIGH (ref 0–5)
pH: 5 (ref 5.0–8.0)

## 2019-08-06 LAB — TROPONIN I (HIGH SENSITIVITY)
Troponin I (High Sensitivity): 21 ng/L — ABNORMAL HIGH (ref <18)
Troponin I (High Sensitivity): 21 ng/L — ABNORMAL HIGH (ref <18)

## 2019-08-06 MED ORDER — SODIUM CHLORIDE 0.9% FLUSH: 3.0000 mL | Freq: Once | INTRAVENOUS | Status: DC

## 2019-08-06 MED ORDER — SODIUM CHLORIDE 0.9 % IV SOLN
2.0000 g | Freq: Once | INTRAVENOUS | Status: AC
  Administered 2019-08-06: 2 g via INTRAVENOUS
  Filled 2019-08-06: qty 2

## 2019-08-06 MED ORDER — MELATONIN 5 MG PO TABS
5.0000 mg | ORAL_TABLET | Freq: Once | ORAL | Status: AC
  Administered 2019-08-07: 5 mg via ORAL
  Filled 2019-08-06: qty 1

## 2019-08-06 MED ORDER — ACETAMINOPHEN 325 MG PO TABS
650.0000 mg | ORAL_TABLET | Freq: Once | ORAL | Status: AC
  Administered 2019-08-06: 650 mg via ORAL
  Filled 2019-08-06: qty 2

## 2019-08-06 NOTE — ED Notes (Signed)
PT needed three person assist to get from recliner to wheelchair and per pt could not bear weight at all. No effort noted from this RN.

## 2019-08-06 NOTE — Progress Notes (Signed)
PHARMACY -  BRIEF ANTIBIOTIC NOTE   Pharmacy has received consult(s) for Cefepime from an ED provider.  The patient's profile has been reviewed for ht/wt/allergies/indication/available labs.    One time order(s) placed for Cefepime 2gm.  Further antibiotics/pharmacy consults should be ordered by admitting physician if indicated.                       Thank you, Hart Robinsons A 08/06/2019  11:13 PM

## 2019-08-06 NOTE — ED Notes (Signed)
Patient states she just left [facility] because she wanted to be home for Thanksgiving. Patient states they discharged her, and but she told them she was not able to get in car or home. Patient states she is really tired and weak from having Hemodialysis today. She states she usually goes on Tuesday, Thursday and Saturday but due to thanksgiving she went today. Patient states she needs help getting back to a rehab facility.

## 2019-08-06 NOTE — ED Provider Notes (Signed)
[facility] Emergency Department Provider Note  ____________________________________________   First MD Initiated Contact with Patient 08/06/19 2038     (approximate)  I have reviewed the triage vital signs and the nursing notes.   HISTORY  Chief Complaint Weakness    HPI Caroline Hernandez is a 73 y.o. female  With extensive PMHx including CAD, CHF, ESRD, CHF, recurrent UTIs here with weakness. Pt went to HD today as scheduled. She states she has felt somewhat fatigued x days 2/2 a UTI, for which she is on Cipro. She went to HD today and felt weak after, but reportedly was near time for d/c for her SNF and was going to be d/c'ed today for the holidays. Pt reports that after HD, she felt incredibly weak and fatigued. She required assistance to get in the car but states she really wanted to return home so she continued going home. She then was unable to get in to the house 2/2 weaknss. Reports she feels "awful." No known CP. No SOB. She reports lightheadedness with movement and gen weakness. No headache. No other complaints.   Past Medical History:  Diagnosis Date   Amputation of left lower extremity below knee ([place])    Amputation of right lower extremity below knee ([place])    CAD (coronary artery disease)    a. 2004 Cardiac Arrest/CABG x 3 (LIMA->LAD, VG->OM, VG->RCA);  b. 06/2015 lexiscan MV: no significant ischemia, EF 48%, low risk->Med Rx.   Chronic combined systolic and diastolic CHF (congestive heart failure) ([place])    a. 12/2014 Echo: EF 45-50%;  b. 08/2015 Echo: EF 45-50%, ant, antsept HK, mildly dil LA, nl RV, mild-mod TR, sev PAH (69mmHg); b. 08/2017 TEE: EF 40-45%, large PFO w/ L->R shunting.   CKD (chronic kidney disease), stage IV (HCC)    CLL (chronic lymphocytic leukemia) (HCC)    Diabetes mellitus without complication ([place])    Essential hypertension    GERD (gastroesophageal reflux disease)    Hiatal hernia    History of cardiac arrest      a. 2004.   Hyperlipidemia    Ischemic cardiomyopathy    a. s/p MDT ICD (originally had 3532 lead-->gen change and lead revision ~ 2012 @ [facility]); b. 12/2014 Echo: EF 45-50%;  c.08/2015 Echo: EF 45-50%; d. 08/2017 Echo: EF 40-45%.   PAD (peripheral artery disease) ([place])    a. s/p bilat BKA   Persistent atrial fibrillation ([place])    a. Dx 12/2014.  CHA2DS2VASc = 6--> warfarin;  b. 09/2015 s/p DCCV-->on amio; c. s/p DCCV 04/25/16; d. 07/2016 s/p RFCA/PVI in setting of recurrent afib despite amio; e. 05/2018 Recurrent afib.    Patient Active Problem List   Diagnosis Date Noted   Acute lower UTI 08/07/2019   Dilated cardiomyopathy ([place]) 06/27/2019   Unstable angina ([place]) 06/26/2019   NSTEMI (non-ST elevated myocardial infarction) ([place]) 11/18/2018   CLL (chronic lymphocytic leukemia) ([place]) 10/23/2018   Pressure injury of skin 10/19/2018   CHF (congestive heart failure) ([place]) 10/16/2018   CAP (community acquired pneumonia) 04/14/2018   UTI (urinary tract infection) 03/07/2018   Atrial fibrillation with slow ventricular response ([place]) 07/18/2016   Chronic diastolic CHF (congestive heart failure) (HCC)    SOB (shortness of breath)    Acute on chronic systolic heart failure ([place]) 08/20/2015   CKD (chronic kidney disease), stage IV (HCC)    Ischemic cardiomyopathy    Chronic combined systolic and diastolic CHF (congestive heart failure) ([place])    Essential hypertension 06/18/2015  Systolic and diastolic CHF, acute on chronic (HCC)    Chronic renal failure syndrome, stage 5 (HCC)    Angina pectoris (HCC)    Coronary artery disease involving native coronary artery of native heart with angina pectoris with documented spasm [facility])    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 (HCC)    Persistent atrial fibrillation (HCC)    Bradycardia    Chest pain 06/12/2015    Past Surgical History:  Procedure Laterality Date   ABDOMINAL HYSTERECTOMY      Amputation lower extremity bilaterally Bilateral    CAPD INSERTION N/A 10/25/2018   Procedure: LAPAROSCOPIC INSERTION CONTINUOUS AMBULATORY PERITONEAL DIALYSIS  (CAPD) CATHETER;  Surgeon: Katha Cabal, MD;  Location: [facility] ORS;  Service: Vascular;  Laterality: N/A;   CARDIAC CATHETERIZATION     CARDIOVERSION N/A 10/09/2016   Procedure: CARDIOVERSION;  Surgeon: Lelon Perla, MD;  Location: [place];  Service: Cardiovascular;  Laterality: N/A;   CORONARY ARTERY BYPASS GRAFT     CORONARY STENT INTERVENTION N/A 11/20/2018   Procedure: CORONARY STENT INTERVENTION;  Surgeon: Isaias Cowman, MD;  Location: [place] CV LAB;  Service: Cardiovascular;  Laterality: N/A;  SVG- OM   DIALYSIS/PERMA CATHETER INSERTION N/A 10/22/2018   Procedure: DIALYSIS/PERMA CATHETER INSERTION;  Surgeon: Katha Cabal, MD;  Location: [place] CV LAB;  Service: Cardiovascular;  Laterality: N/A;   DIALYSIS/PERMA CATHETER INSERTION N/A 06/30/2019   Procedure: DIALYSIS/PERMA CATHETER INSERTION;  Surgeon: Algernon Huxley, MD;  Location: [place] CV LAB;  Service: Cardiovascular;  Laterality: N/A;   DIALYSIS/PERMA CATHETER REMOVAL N/A 01/06/2019   Procedure: DIALYSIS/PERMA CATHETER REMOVAL;  Surgeon: Algernon Huxley, MD;  Location: [place] CV LAB;  Service: Cardiovascular;  Laterality: N/A;   ELECTROPHYSIOLOGIC STUDY N/A 10/07/2015   Procedure: CARDIOVERSION;  Surgeon: Minna Merritts, MD;  Location: [facility] ORS;  Service: Cardiovascular;  Laterality: N/A;   ELECTROPHYSIOLOGIC STUDY N/A 04/25/2016   Procedure: Cardioversion;  Surgeon: Minna Merritts, MD;  Location: [facility] ORS;  Service: Cardiovascular;  Laterality: N/A;   ELECTROPHYSIOLOGIC STUDY N/A 07/18/2016   Procedure: Atrial Fibrillation Ablation;  Surgeon: Thompson Grayer, MD;  Location: [place] CV LAB;  Service: Cardiovascular;  Laterality: N/A;   IMPLANTABLE CARDIOVERTER DEFIBRILLATOR IMPLANT  2005   [employer]    LEFT HEART  CATH AND CORS/GRAFTS ANGIOGRAPHY N/A 11/20/2018   Procedure: LEFT HEART CATH AND CORS/GRAFTS ANGIOGRAPHY;  Surgeon: Minna Merritts, MD;  Location: [place] CV LAB;  Service: Cardiovascular;  Laterality: N/A;   TEE WITHOUT CARDIOVERSION N/A 07/18/2016   Procedure: TRANSESOPHAGEAL ECHOCARDIOGRAM (TEE);  Surgeon: Sueanne Margarita, MD;  Location: [facility] ENDOSCOPY;  Service: Cardiovascular;  Laterality: N/A;   TEE WITHOUT CARDIOVERSION N/A 10/09/2016   Procedure: TRANSESOPHAGEAL ECHOCARDIOGRAM (TEE);  Surgeon: Lelon Perla, MD;  Location: [facility] ENDOSCOPY;  Service: Cardiovascular;  Laterality: N/A;    Prior to Admission medications   Medication Sig Start Date End Date Taking? Authorizing Provider  amitriptyline (ELAVIL) 25 MG tablet Take 25 mg by mouth at bedtime.    Yes [provider]  amLODipine (NORVASC) 10 MG tablet TAKE 1 TABLET BY MOUTH DAILY 06/02/19  Yes Gollan, Kathlene November, MD  apixaban (ELIQUIS) 5 MG TABS tablet Take 1 tablet (5 mg total) by mouth 2 (two) times daily. 11/01/18  Yes Deboraha Sprang, MD  atorvastatin (LIPITOR) 40 MG tablet Take 40 mg by mouth at bedtime.    Yes [provider]  Cholecalciferol (HM VITAMIN D3) 100 MCG (4000 UT) CAPS  Take 4,000 Units by mouth daily.    Yes [provider]  CIPRO 500 MG tablet Take 500 mg by mouth daily. 08/02/19  Yes [provider]  clopidogrel (PLAVIX) 75 MG tablet Take 1 tablet (75 mg) by mouth once daily 12/26/18  Yes Gollan, Kathlene November, MD  gabapentin (NEURONTIN) 100 MG capsule Take 1 capsule (100 mg total) by mouth 2 (two) times daily. 06/29/19  Yes Fritzi Mandes, MD  insulin aspart (NOVOLOG) 100 UNIT/ML injection Inject 4 Units into the skin 3 (three) times daily with meals. 10 units into the skin before breakfast then 5 units before lunch then 10 units before supper (evening meal) 06/29/19  Yes Fritzi Mandes, MD  insulin glargine (LANTUS) 100 UNIT/ML injection Inject 0.12 mLs (12 Units total) into the skin  daily. 06/29/19  Yes Fritzi Mandes, MD  iron polysaccharides (NIFEREX) 150 MG capsule Take 150 mg by mouth daily.   Yes [provider]  isosorbide mononitrate (IMDUR) 30 MG 24 hr tablet Take 1 tablet (30 mg total) by mouth daily. 06/30/19  Yes Fritzi Mandes, MD  Multiple Vitamins-Minerals (VITEYES AREDS FORMULA/LUTEIN) CAPS Take 1 capsule by mouth 2 (two) times daily.   Yes [provider]  nystatin (MYCOSTATIN/NYSTOP) powder Apply topically 3 (three) times daily. 06/29/19  Yes Fritzi Mandes, MD  pantoprazole (PROTONIX) 40 MG tablet Take 40 mg by mouth at bedtime. 06/19/16  Yes [provider]  sertraline (ZOLOFT) 100 MG tablet Take 100 mg by mouth at bedtime.  06/02/15  Yes [provider]  sevelamer carbonate (RENVELA) 800 MG tablet Take 3 tablets (2,400 mg total) by mouth 3 (three) times daily with meals. 06/29/19  Yes Fritzi Mandes, MD  torsemide (DEMADEX) 20 MG tablet TAKE 2 TABLETS(40 MG) BY MOUTH TWICE DAILY *NEEDS OFFICE VISIT FOR FURTHER REFILLS. PLEASE CALL [phone] TO SCHEDULE.* 07/17/19  Yes Theora Gianotti, NP  vitamin C (ASCORBIC ACID) 500 MG tablet Take 500 mg by mouth daily.   Yes [provider]  nitroGLYCERIN (NITROSTAT) 0.4 MG SL tablet Place 1 tablet (0.4 mg total) under the tongue every 5 (five) minutes x 3 doses as needed for chest pain. 11/23/18   Ripley Fraise, PA    Allergies Piperacillin, Piperacillin-tazobactam in dex, and Zosyn [piperacillin sod-tazobactam so]  Family History  Problem Relation Age of Onset   CAD Mother    Diabetes Mother    Atrial fibrillation Mother    Lung cancer Father    Diabetes Father     Social History Social History   Tobacco Use   Smoking status: Never Smoker   Smokeless tobacco: Never Used  Substance Use Topics   Alcohol use: No   Drug use: No    Review of Systems  Review of Systems  Constitutional: Positive for fatigue. Negative for fever.  HENT: Negative for  congestion and sore throat.   Eyes: Negative for visual disturbance.  Respiratory: Negative for cough and shortness of breath.   Cardiovascular: Negative for chest pain.  Gastrointestinal: Negative for abdominal pain, diarrhea, nausea and vomiting.  Genitourinary: Negative for flank pain.  Musculoskeletal: Negative for back pain and neck pain.  Skin: Negative for rash and wound.  Neurological: Positive for weakness and light-headedness.  All other systems reviewed and are negative.    ____________________________________________  PHYSICAL EXAM:      VITAL SIGNS: ED Triage Vitals  Enc Vitals Group     BP 08/06/19 1633 (!) 109/54     Pulse Rate 08/06/19 1633 61  Resp 08/06/19 1633 18     Temp 08/06/19 1633 98.1 F (36.7 C)     Temp Source 08/06/19 1633 Oral     SpO2 08/06/19 1633 94 %     Weight 08/06/19 1636 210 lb (95.3 kg)     Height 08/06/19 1636 5\' 2"  (1.575 m)     Head Circumference --      Peak Flow --      Pain Score 08/06/19 1627 5     Pain Loc --      Pain Edu? --      Excl. in [place]? --      Physical Exam Vitals signs and nursing note reviewed.  Constitutional:      General: She is not in acute distress.    Appearance: She is well-developed.  HENT:     Head: Normocephalic and atraumatic.     Mouth/Throat:     Mouth: Mucous membranes are dry.  Eyes:     Conjunctiva/sclera: Conjunctivae normal.  Neck:     Musculoskeletal: Neck supple.  Cardiovascular:     Rate and Rhythm: Normal rate and regular rhythm.     Heart sounds: Normal heart sounds. No murmur. No friction rub.  Pulmonary:     Effort: Pulmonary effort is normal. No respiratory distress.     Breath sounds: Normal breath sounds. No wheezing or rales.  Abdominal:     General: There is no distension.     Palpations: Abdomen is soft.     Tenderness: There is no abdominal tenderness.  Skin:    General: Skin is warm.     Capillary Refill: Capillary refill takes less than 2 seconds.  Neurological:      Mental Status: She is alert and oriented to person, place, and time.     Motor: No abnormal muscle tone.       ____________________________________________   LABS (all labs ordered are listed, but only abnormal results are displayed)  Labs Reviewed  BASIC METABOLIC PANEL - Abnormal; Notable for the following components:      Result Value   Sodium 134 (*)    Potassium 2.8 (*)    Chloride 97 (*)    Glucose, Bld 207 (*)    Creatinine, Ser 2.96 (*)    Calcium 8.0 (*)    GFR calc non Af Amer 15 (*)    GFR calc Af Amer 18 (*)    All other components within normal limits  CBC - Abnormal; Notable for the following components:   WBC 25.5 (*)    RBC 3.76 (*)    Hemoglobin 10.4 (*)    HCT 34.4 (*)    RDW 17.6 (*)    All other components within normal limits  URINALYSIS, COMPLETE (UACMP) WITH MICROSCOPIC - Abnormal; Notable for the following components:   Color, Urine YELLOW (*)    APPearance TURBID (*)    Hgb urine dipstick MODERATE (*)    Protein, ur 100 (*)    Leukocytes,Ua LARGE (*)    RBC / HPF >50 (*)    WBC, UA >50 (*)    Bacteria, UA MANY (*)    All other components within normal limits  TROPONIN I (HIGH SENSITIVITY) - Abnormal; Notable for the following components:   Troponin I (High Sensitivity) 21 (*)    All other components within normal limits  TROPONIN I (HIGH SENSITIVITY) - Abnormal; Notable for the following components:   Troponin I (High Sensitivity) 21 (*)    All other components  within normal limits  SARS CORONAVIRUS 2 (TAT 6-24 HRS)  URINE CULTURE  LACTIC ACID, PLASMA    ____________________________________________  EKG: Atrial fibrillation with slow response, VR 59. QRS 134, QTc 431. No acute St elevations or depressions. NO ischemia or infarct. ________________________________________  RADIOLOGY All imaging, including plain films, CT scans, and ultrasounds, independently reviewed by me, and interpretations confirmed via formal radiology  reads.  ED MD interpretation:   CXR: small L pleural effusion w/ atelectasis CT Chest: atelectasis, effusions, small airway disease   Official radiology report(s): Dg Chest 2 View  Result Date: 08/06/2019 CLINICAL DATA:  Chest pain. Weakness for 1 months, worse in today. Shortness of breath. Patient from dialysis. EXAM: CHEST - 2 VIEW COMPARISON:  Chest radiograph 06/25/2019 FINDINGS: Right-sided dialysis catheter tip in the lower SVC. Left-sided pacemaker in place. Post median sternotomy. Unchanged cardiomegaly. Small left pleural effusion and adjacent atelectasis. Minimal fluid in the fissures. Possible small right pleural effusion. No pulmonary edema. No pneumothorax. Degenerative change in the spine. No acute osseous abnormalities. IMPRESSION: 1. Small left pleural effusion with adjacent atelectasis. 2. Possible small right pleural effusion. 3. Stable cardiomegaly.  No pulmonary edema. Electronically Signed   By: Keith Rake M.D.   On: 08/06/2019 17:37   Ct Chest Wo Contrast  Result Date: 08/06/2019 CLINICAL DATA:  Shortness of breath. Rales. Patient reports weakness for 4 months, worse today. EXAM: CT CHEST WITHOUT CONTRAST TECHNIQUE: Multidetector CT imaging of the chest was performed following the standard protocol without IV contrast. COMPARISON:  Radiograph earlier this day. Chest CT 03/07/2018 FINDINGS: Cardiovascular: Right-sided dialysis catheter tip at the atrial caval junction. Left-sided pacemaker tips in the right atrium and ventricle. Multi chamber cardiomegaly. Coronary artery calcifications. Aortic atherosclerosis without aneurysm. No pericardial effusion. Post CABG. Mediastinum/Nodes: Small mediastinal lymph nodes, enlarged by size criteria. Limited assessment for hilar adenopathy given lack of IV contrast. Prominent bilateral axillary nodes are detained normal lymph node morphology. Slightly patulous esophagus without wall thickening. No suspicious thyroid nodule.  Lungs/Pleura: Small bilateral pleural effusions, left greater than right. Mild heterogeneous airway 10 UA shin throughout both lungs. Linear atelectasis in the right middle lobe and anterior right upper lobe. Compressive atelectasis in the lung bases adjacent to pleural fluid. No pulmonary edema. Trachea and mainstem bronchi are patent. No pulmonary mass. Upper Abdomen: Stones in the gallbladder, no gallbladder inflammation or visualized. Advanced atherosclerosis of upper abdominal vasculature. No acute findings. Musculoskeletal: Prior median sternotomy. There are no acute or suspicious osseous abnormalities. IMPRESSION: 1. Small bilateral pleural effusions, left greater than right. Compressive atelectasis in the lung bases adjacent to pleural fluid. 2. Slight heterogeneous parenchymal attenuation can be seen with small airways disease. 3. Multi chamber cardiomegaly. Coronary artery calcifications. No pulmonary edema. 4. Incidental cholelithiasis. Aortic Atherosclerosis (ICD10-I70.0). Electronically Signed   By: Keith Rake M.D.   On: 08/06/2019 21:26    ____________________________________________  PROCEDURES   Procedure(s) performed (including Critical Care):  Procedures  ____________________________________________  INITIAL IMPRESSION / MDM / [place] / ED COURSE  As part of my medical decision making, I reviewed the following data within the [facility] notes reviewed and incorporated, Old chart reviewed, Notes from prior ED visits, and [place] Controlled Substance [place] was evaluated in Emergency Department on 08/07/2019 for the symptoms described in the history of present illness. She was evaluated in the context of the global COVID-19 pandemic, which necessitated consideration that the patient might be  at risk for infection with the SARS-CoV-2 virus that causes COVID-19. Institutional protocols and algorithms that pertain to the  evaluation of patients at risk for COVID-19 are in a state of rapid change based on information released by regulatory bodies including the CDC and federal and state organizations. These policies and algorithms were followed during the patient's care in the ED.  Some ED evaluations and interventions may be delayed as a result of limited staffing during the pandemic.*     Medical Decision Making:  73 yo M here with generalized weakness. Labs show mild acute on chronic leukocytosis. CMP c/w ESRD. Trop at baseline. UA shows marked pyuria and bacteriura, and pt has h/o recurrent UTIs. Will start on Cefepime based on prior cx, admit.  ____________________________________________  FINAL CLINICAL IMPRESSION(S) / ED DIAGNOSES  Final diagnoses:  Acute cystitis without hematuria  Weakness     MEDICATIONS GIVEN DURING THIS VISIT:  Medications  sodium chloride flush (NS) 0.9 % injection 3 mL (has no administration in time range)  acetaminophen (TYLENOL) tablet 650 mg (650 mg Oral Given 08/06/19 2340)  ceFEPIme (MAXIPIME) 2 g in sodium chloride 0.9 % 100 mL IVPB (0 g Intravenous Stopped 08/07/19 0016)  Melatonin TABS 5 mg (5 mg Oral Given 08/07/19 0011)     ED Discharge Orders    None       Note:  This document was prepared using Dragon voice recognition software and may include unintentional dictation errors.   Duffy Bruce, MD 08/07/19 [phone]

## 2019-08-06 NOTE — ED Notes (Signed)
Patient transported to CT 

## 2019-08-06 NOTE — ED Triage Notes (Signed)
Pt from dialysis , c/o weakness x64months but worsening today. States SOB and CP. Denies cough or fever.

## 2019-08-07 ENCOUNTER — Other Ambulatory Visit: Payer: Self-pay

## 2019-08-07 DIAGNOSIS — N390 Urinary tract infection, site not specified: Secondary | ICD-10-CM

## 2019-08-07 LAB — CBC
HCT: 28.8 % — ABNORMAL LOW (ref 36.0–46.0)
Hemoglobin: 9.4 g/dL — ABNORMAL LOW (ref 12.0–15.0)
MCH: 27.9 pg (ref 26.0–34.0)
MCHC: 32.6 g/dL (ref 30.0–36.0)
MCV: 85.5 fL (ref 80.0–100.0)
Platelets: 165 10*3/uL (ref 150–400)
RBC: 3.37 MIL/uL — ABNORMAL LOW (ref 3.87–5.11)
RDW: 17.7 % — ABNORMAL HIGH (ref 11.5–15.5)
WBC: 23.8 10*3/uL — ABNORMAL HIGH (ref 4.0–10.5)
nRBC: 0 % (ref 0.0–0.2)

## 2019-08-07 LAB — MAGNESIUM: Magnesium: 1.8 mg/dL (ref 1.7–2.4)

## 2019-08-07 LAB — COMPREHENSIVE METABOLIC PANEL
ALT: 8 U/L (ref 0–44)
AST: 18 U/L (ref 15–41)
Albumin: 2.9 g/dL — ABNORMAL LOW (ref 3.5–5.0)
Alkaline Phosphatase: 72 U/L (ref 38–126)
Anion gap: 11 (ref 5–15)
BUN: 12 mg/dL (ref 8–23)
CO2: 25 mmol/L (ref 22–32)
Calcium: 8.2 mg/dL — ABNORMAL LOW (ref 8.9–10.3)
Chloride: 99 mmol/L (ref 98–111)
Creatinine, Ser: 3.62 mg/dL — ABNORMAL HIGH (ref 0.44–1.00)
GFR calc Af Amer: 14 mL/min — ABNORMAL LOW (ref >60)
GFR calc non Af Amer: 12 mL/min — ABNORMAL LOW (ref >60)
Glucose, Bld: 146 mg/dL — ABNORMAL HIGH (ref 70–99)
Potassium: 3.1 mmol/L — ABNORMAL LOW (ref 3.5–5.1)
Sodium: 135 mmol/L (ref 135–145)
Total Bilirubin: 0.9 mg/dL (ref 0.3–1.2)
Total Protein: 5.3 g/dL — ABNORMAL LOW (ref 6.5–8.1)

## 2019-08-07 LAB — SARS CORONAVIRUS 2 (TAT 6-24 HRS): SARS Coronavirus 2: NEGATIVE

## 2019-08-07 LAB — GLUCOSE, CAPILLARY
Glucose-Capillary: 121 mg/dL — ABNORMAL HIGH (ref 70–99)
Glucose-Capillary: 135 mg/dL — ABNORMAL HIGH (ref 70–99)
Glucose-Capillary: 138 mg/dL — ABNORMAL HIGH (ref 70–99)
Glucose-Capillary: 140 mg/dL — ABNORMAL HIGH (ref 70–99)
Glucose-Capillary: 150 mg/dL — ABNORMAL HIGH (ref 70–99)

## 2019-08-07 LAB — MRSA PCR SCREENING: MRSA by PCR: POSITIVE — AB

## 2019-08-07 LAB — LACTIC ACID, PLASMA: Lactic Acid, Venous: 1.2 mmol/L (ref 0.5–1.9)

## 2019-08-07 MED ORDER — POTASSIUM CHLORIDE CRYS ER 20 MEQ PO TBCR
40.0000 meq | EXTENDED_RELEASE_TABLET | Freq: Three times a day (TID) | ORAL | Status: AC
  Administered 2019-08-07 (×2): 40 meq via ORAL
  Filled 2019-08-07 (×2): qty 2

## 2019-08-07 MED ORDER — INSULIN ASPART 100 UNIT/ML ~~LOC~~ SOLN
0.0000 [IU] | Freq: Every day | SUBCUTANEOUS | Status: DC
  Administered 2019-08-09 – 2019-08-10 (×2): 2 [IU] via SUBCUTANEOUS
  Filled 2019-08-07 (×2): qty 1

## 2019-08-07 MED ORDER — ISOSORBIDE MONONITRATE ER 30 MG PO TB24
30.0000 mg | ORAL_TABLET | Freq: Every day | ORAL | Status: DC
  Administered 2019-08-07 – 2019-08-12 (×6): 30 mg via ORAL
  Filled 2019-08-07 (×7): qty 1

## 2019-08-07 MED ORDER — CHLORHEXIDINE GLUCONATE CLOTH 2 % EX PADS
6.0000 | MEDICATED_PAD | Freq: Every day | CUTANEOUS | Status: AC
  Administered 2019-08-07 – 2019-08-11 (×5): 6 via TOPICAL

## 2019-08-07 MED ORDER — ATORVASTATIN CALCIUM 20 MG PO TABS
40.0000 mg | ORAL_TABLET | Freq: Every day | ORAL | Status: DC
  Administered 2019-08-07 – 2019-08-14 (×8): 40 mg via ORAL
  Filled 2019-08-07 (×8): qty 2

## 2019-08-07 MED ORDER — APIXABAN 2.5 MG PO TABS
2.5000 mg | ORAL_TABLET | Freq: Once | ORAL | Status: AC
  Administered 2019-08-07: 2.5 mg via ORAL
  Filled 2019-08-07: qty 1

## 2019-08-07 MED ORDER — AMLODIPINE BESYLATE 10 MG PO TABS
10.0000 mg | ORAL_TABLET | Freq: Every day | ORAL | Status: DC
  Filled 2019-08-07: qty 1

## 2019-08-07 MED ORDER — MELATONIN 5 MG PO TABS
5.0000 mg | ORAL_TABLET | Freq: Every day | ORAL | Status: DC
  Administered 2019-08-07 – 2019-08-13 (×7): 5 mg via ORAL
  Filled 2019-08-07 (×9): qty 1

## 2019-08-07 MED ORDER — AMITRIPTYLINE HCL 25 MG PO TABS
25.0000 mg | ORAL_TABLET | Freq: Every day | ORAL | Status: DC
  Administered 2019-08-07 – 2019-08-13 (×7): 25 mg via ORAL
  Filled 2019-08-07 (×7): qty 1

## 2019-08-07 MED ORDER — TORSEMIDE 20 MG PO TABS
40.0000 mg | ORAL_TABLET | Freq: Two times a day (BID) | ORAL | Status: DC
  Filled 2019-08-07: qty 2

## 2019-08-07 MED ORDER — CLOPIDOGREL BISULFATE 75 MG PO TABS
75.0000 mg | ORAL_TABLET | Freq: Every day | ORAL | Status: DC
  Administered 2019-08-07 – 2019-08-14 (×8): 75 mg via ORAL
  Filled 2019-08-07 (×8): qty 1

## 2019-08-07 MED ORDER — INSULIN ASPART 100 UNIT/ML ~~LOC~~ SOLN
0.0000 [IU] | Freq: Three times a day (TID) | SUBCUTANEOUS | Status: DC
  Administered 2019-08-09 (×2): 2 [IU] via SUBCUTANEOUS
  Administered 2019-08-09 – 2019-08-14 (×4): 1 [IU] via SUBCUTANEOUS
  Filled 2019-08-07 (×6): qty 1

## 2019-08-07 MED ORDER — MUPIROCIN 2 % EX OINT
1.0000 "application " | TOPICAL_OINTMENT | Freq: Two times a day (BID) | CUTANEOUS | Status: AC
  Administered 2019-08-07 – 2019-08-11 (×10): 1 via NASAL
  Filled 2019-08-07: qty 22

## 2019-08-07 MED ORDER — SODIUM CHLORIDE 0.9 % IV SOLN
250.0000 mL | INTRAVENOUS | Status: DC | PRN
  Administered 2019-08-08: 250 mL via INTRAVENOUS

## 2019-08-07 MED ORDER — ACETAMINOPHEN 325 MG PO TABS
650.0000 mg | ORAL_TABLET | Freq: Four times a day (QID) | ORAL | Status: DC | PRN
  Administered 2019-08-07 – 2019-08-13 (×7): 650 mg via ORAL
  Filled 2019-08-07 (×8): qty 2

## 2019-08-07 MED ORDER — APIXABAN 5 MG PO TABS
5.0000 mg | ORAL_TABLET | Freq: Two times a day (BID) | ORAL | Status: DC
  Administered 2019-08-07 – 2019-08-14 (×13): 5 mg via ORAL
  Filled 2019-08-07 (×13): qty 1

## 2019-08-07 MED ORDER — SERTRALINE HCL 50 MG PO TABS
100.0000 mg | ORAL_TABLET | Freq: Every day | ORAL | Status: DC
  Administered 2019-08-07 – 2019-08-13 (×7): 100 mg via ORAL
  Filled 2019-08-07 (×7): qty 2

## 2019-08-07 MED ORDER — ACETAMINOPHEN 650 MG RE SUPP: 650.0000 mg | Freq: Four times a day (QID) | RECTAL | Status: DC | PRN

## 2019-08-07 MED ORDER — TORSEMIDE 20 MG PO TABS
20.0000 mg | ORAL_TABLET | Freq: Two times a day (BID) | ORAL | Status: DC
  Administered 2019-08-07 – 2019-08-10 (×5): 20 mg via ORAL
  Filled 2019-08-07 (×8): qty 1

## 2019-08-07 MED ORDER — SODIUM CHLORIDE 0.9% FLUSH: 3.0000 mL | INTRAVENOUS | Status: DC | PRN

## 2019-08-07 MED ORDER — SODIUM CHLORIDE 0.9% FLUSH
3.0000 mL | Freq: Two times a day (BID) | INTRAVENOUS | Status: DC
  Administered 2019-08-07 – 2019-08-14 (×14): 3 mL via INTRAVENOUS

## 2019-08-07 MED ORDER — PANTOPRAZOLE SODIUM 40 MG PO TBEC
40.0000 mg | DELAYED_RELEASE_TABLET | Freq: Every day | ORAL | Status: DC
  Administered 2019-08-07 – 2019-08-13 (×7): 40 mg via ORAL
  Filled 2019-08-07 (×7): qty 1

## 2019-08-07 MED ORDER — SODIUM CHLORIDE 0.9 % IV SOLN
1.0000 g | INTRAVENOUS | Status: DC
  Administered 2019-08-07 – 2019-08-08 (×2): 1 g via INTRAVENOUS
  Filled 2019-08-07: qty 1
  Filled 2019-08-07: qty 10

## 2019-08-07 MED ORDER — POLYSACCHARIDE IRON COMPLEX 150 MG PO CAPS
150.0000 mg | ORAL_CAPSULE | Freq: Every day | ORAL | Status: DC
  Administered 2019-08-07 – 2019-08-14 (×8): 150 mg via ORAL
  Filled 2019-08-07 (×9): qty 1

## 2019-08-07 MED ORDER — MAGNESIUM SULFATE IN D5W 1-5 GM/100ML-% IV SOLN
1.0000 g | Freq: Once | INTRAVENOUS | Status: AC
  Administered 2019-08-07: 1 g via INTRAVENOUS
  Filled 2019-08-07: qty 100

## 2019-08-07 MED ORDER — SEVELAMER CARBONATE 800 MG PO TABS
2400.0000 mg | ORAL_TABLET | Freq: Three times a day (TID) | ORAL | Status: DC
  Administered 2019-08-07 – 2019-08-14 (×21): 2400 mg via ORAL
  Filled 2019-08-07 (×21): qty 3

## 2019-08-07 MED ORDER — GABAPENTIN 100 MG PO CAPS
100.0000 mg | ORAL_CAPSULE | Freq: Two times a day (BID) | ORAL | Status: DC
  Administered 2019-08-07 – 2019-08-14 (×16): 100 mg via ORAL
  Filled 2019-08-07 (×16): qty 1

## 2019-08-07 MED ORDER — INSULIN GLARGINE 100 UNIT/ML ~~LOC~~ SOLN
12.0000 [IU] | Freq: Every day | SUBCUTANEOUS | Status: DC
  Administered 2019-08-07 – 2019-08-14 (×8): 12 [IU] via SUBCUTANEOUS
  Filled 2019-08-07 (×9): qty 0.12

## 2019-08-07 NOTE — Progress Notes (Addendum)
Caroline Hernandez at [place] NAME: Caroline Hernandez    MR#:  [id number]  Harvey:  1945-10-22  SUBJECTIVE:  patient was discharged from rehab yesterday went to dialysis thereafter went home was very weak fell sick and brought back to the emergency room  REVIEW OF SYSTEMS:   Review of Systems  Constitutional: Negative for chills, fever and weight loss.  HENT: Negative for ear discharge, ear pain and nosebleeds.   Eyes: Negative for blurred vision, pain and discharge.  Respiratory: Negative for sputum production, shortness of breath, wheezing and stridor.   Cardiovascular: Negative for chest pain, palpitations, orthopnea and PND.  Gastrointestinal: Negative for abdominal pain, diarrhea, nausea and vomiting.  Genitourinary: Negative for frequency and urgency.  Musculoskeletal: Negative for back pain and joint pain.  Neurological: Positive for weakness. Negative for sensory change, speech change and focal weakness.  Psychiatric/Behavioral: Negative for depression and hallucinations. The patient is not nervous/anxious.    Tolerating Diet: yes Tolerating PT: pending  DRUG ALLERGIES:   Allergies  Allergen Reactions  . Piperacillin Other (See Comments)    Renal failure  . 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 See ID note**PER PT-CAUSED RENAL FAILURE**  . Zosyn [Piperacillin Sod-Tazobactam So] Other (See Comments)    Renal failure    VITALS:  Blood pressure (!) 96/52, pulse (!) 56, temperature 98.1 F (36.7 C), temperature source Oral, resp. rate 16, height 5\' 2"  (1.575 m), weight 103.4 kg, SpO2 99 %.  PHYSICAL EXAMINATION:   Physical Exam  GENERAL:  73 y.o.-year-old patient lying in the bed with no acute distress. Chronically ill EYES: Pupils equal, round, reactive to light and accommodation. No scleral icterus. Extraocular muscles intact.  HEENT: Head atraumatic, normocephalic. Oropharynx and  nasopharynx clear.  NECK:  Supple, no jugular venous distention. No thyroid enlargement, no tenderness.  LUNGS: Normal breath sounds bilaterally, no wheezing, rales, rhonchi. No use of accessory muscles of respiration.  CARDIOVASCULAR: S1, S2 normal. No murmurs, rubs, or gallops.  ABDOMEN: Soft, nontender, nondistended. Bowel sounds present. No organomegaly or mass.  EXTREMITIES: bilateral below knee amputation stump looks okay NEUROLOGIC: grossly nonfocal. PSYCHIATRIC:  patient is alert and oriented x 3.  SKIN: No obvious rash, lesion, or ulcer.   LABORATORY PANEL:  CBC Recent Labs  Lab 08/07/19 0446  WBC 23.8*  HGB 9.4*  HCT 28.8*  PLT 165    Chemistries  Recent Labs  Lab 08/07/19 0446 08/07/19 1013  NA 135  --   K 3.1*  --   CL 99  --   CO2 25  --   GLUCOSE 146*  --   BUN 12  --   CREATININE 3.62*  --   CALCIUM 8.2*  --   MG  --  1.8  AST 18  --   ALT 8  --   ALKPHOS 72  --   BILITOT 0.9  --    Cardiac Enzymes No results for input(s): TROPONINI in the last 168 hours. RADIOLOGY:  Dg Chest 2 View  Result Date: 08/06/2019 CLINICAL DATA:  Chest pain. Weakness for 1 months, worse in today. Shortness of breath. Patient from dialysis. EXAM: CHEST - 2 VIEW COMPARISON:  Chest radiograph 06/25/2019 FINDINGS: Right-sided dialysis catheter tip in the lower SVC. Left-sided pacemaker in place. Post median sternotomy. Unchanged cardiomegaly. Small left pleural effusion and adjacent atelectasis. Minimal fluid in the fissures. Possible small right pleural effusion. No pulmonary edema. No  pneumothorax. Degenerative change in the spine. No acute osseous abnormalities. IMPRESSION: 1. Small left pleural effusion with adjacent atelectasis. 2. Possible small right pleural effusion. 3. Stable cardiomegaly.  No pulmonary edema. Electronically Signed   By: Keith Rake M.D.   On: 08/06/2019 17:37   Ct Chest Wo Contrast  Result Date: 08/06/2019 CLINICAL DATA:  Shortness of breath.  Rales. Patient reports weakness for 4 months, worse today. EXAM: CT CHEST WITHOUT CONTRAST TECHNIQUE: Multidetector CT imaging of the chest was performed following the standard protocol without IV contrast. COMPARISON:  Radiograph earlier this day. Chest CT 03/07/2018 FINDINGS: Cardiovascular: Right-sided dialysis catheter tip at the atrial caval junction. Left-sided pacemaker tips in the right atrium and ventricle. Multi chamber cardiomegaly. Coronary artery calcifications. Aortic atherosclerosis without aneurysm. No pericardial effusion. Post CABG. Mediastinum/Nodes: Small mediastinal lymph nodes, enlarged by size criteria. Limited assessment for hilar adenopathy given lack of IV contrast. Prominent bilateral axillary nodes are detained normal lymph node morphology. Slightly patulous esophagus without wall thickening. No suspicious thyroid nodule. Lungs/Pleura: Small bilateral pleural effusions, left greater than right. Mild heterogeneous airway 10 UA shin throughout both lungs. Linear atelectasis in the right middle lobe and anterior right upper lobe. Compressive atelectasis in the lung bases adjacent to pleural fluid. No pulmonary edema. Trachea and mainstem bronchi are patent. No pulmonary mass. Upper Abdomen: Stones in the gallbladder, no gallbladder inflammation or visualized. Advanced atherosclerosis of upper abdominal vasculature. No acute findings. Musculoskeletal: Prior median sternotomy. There are no acute or suspicious osseous abnormalities. IMPRESSION: 1. Small bilateral pleural effusions, left greater than right. Compressive atelectasis in the lung bases adjacent to pleural fluid. 2. Slight heterogeneous parenchymal attenuation can be seen with small airways disease. 3. Multi chamber cardiomegaly. Coronary artery calcifications. No pulmonary edema. 4. Incidental cholelithiasis. Aortic Atherosclerosis (ICD10-I70.0). Electronically Signed   By: Keith Rake M.D.   On: 08/06/2019 21:26   ASSESSMENT  AND PLAN:  Caroline Hernandez is a 73 y.o. female  With extensive PMHx including CAD, CHF, ESRD, CHF, recurrent UTIs here with weakness. Pt went to HD today as scheduled. She states she has felt somewhat fatigued x days 2/2 a UTI, for which she is on Cipro. She went to HD today and felt weak after, but reportedly was near time for d/c for her SNF and was discharged from [facility] facility to go  home for the holidays. Pt reports that after HD, she felt incredibly weak and fatigued.  #Generalized weakness, chronic fatiguability with relative hypotension -hold Norvasc -decreased dose of torsemide 20 BID -patient feels better today. -At baseline she transfers from bed to chair. She has bilateral lower extremity processes. At home she has electric wheelchair also per daughter -patient just got out of [facility] yesterday. Per daughter to for people to get her out of the chair but patient could not enter the house and was brought to the emergency room -physical therapy, occupational therapy  #Abnormal UA -given history of end-stage renal disease on dialysis and very little urine output I feel this is colonization -has not had fever, denies any dysuria symptoms -received IV Rocephin -urine culture pending-- if remains negative I will discontinue antibiotics -patient has had antibiotics couple rounds lately -this was discussed with patient's daughter [name] wall-- she voiced understanding  #Chronic atrial fibrillation -not on beta-blockers. 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  >50% time spent on counselling and coordination of care  POSSIBLE D/C IN *1-3** DAYS, DEPENDING ON CLINICAL CONDITION.  Note: This dictation was prepared with Dragon dictation along with smaller phrase technology. Any transcriptional errors that result from this process are unintentional.  Fritzi Mandes M.D on 08/07/2019 at 1:43 PM  Between 7am to 6pm - Pager - [phone]  After 6pm go to [email or web address]  [facility]   CC: Primary care physician; System, Pcp Not InPatient ID: Arynn Armand, female   DOB: 01-Oct-1945, 73 y.o.   MRN: [id number]

## 2019-08-07 NOTE — H&P (Signed)
TRH H&P    Patient Demographics:    Caroline Hernandez, is a 73 y.o. female  MRN: [id number]  DOB - 07/12/1946  Admit Date - 08/06/2019  Referring MD/NP/PA:   Lindell Noe  Outpatient Primary MD for the patient is System, Pcp Not In  Patient coming from:  home  Chief complaint-   weakness   HPI:    Caroline Hernandez  is a 73 y.o. female,  w hypertension, hyperlipidemia, Dm2, ESRD on PD, PAD s/p bilateral BKA, CAD, CHF (EF 45%), Chronic Afib, CLL presents with c/o weakness.  Pt recent admitted on 06/25/19 for diarrhea (negative C. Diff), pt denies dysuria, n/v, fever, chills, diarrhea, brbpr, black stool.   In ED  T 98.1, P 61, R 18, Bp 109/54 pox 94% on RA Wt 95.3 kg  CT chest IMPRESSION: 1. Small bilateral pleural effusions, left greater than right. Compressive atelectasis in the lung bases adjacent to pleural fluid. 2. Slight heterogeneous parenchymal attenuation can be seen with small airways disease. 3. Multi chamber cardiomegaly. Coronary artery calcifications. No pulmonary edema. 4. Incidental cholelithiasis.  Na 134, K 2.8 Bun 9, Creatinine 2.96 Wbc 25.5, hgb 10.4, Plt 182 Trop 21, -> 21 Urinalysis wbc >50, rbc >50  ekg Afib at 60, Lad (borderline), no st-t changes c/w ischemia  Pt will be admitted for acute lower uti, and hypokalemia      Review of systems:    In addition to the HPI above,  No Fever-chills, No Headache, No changes with Vision or hearing, No problems swallowing food or Liquids, No Chest pain, Cough or Shortness of Breath, No Abdominal pain, No Nausea or Vomiting, bowel movements are regular, No Blood in stool or Urine, No dysuria, No new skin rashes or bruises, No new joints pains-aches,  No new weakness, tingling, numbness in any extremity, No recent weight gain or loss, No polyuria, polydypsia or polyphagia, No significant Mental Stressors.  All other  systems reviewed and are negative.    Past History of the following :    Past Medical History:  Diagnosis Date  . Amputation of left lower extremity below knee ([place])   . Amputation of right lower extremity below knee ([place])   . CAD (coronary artery disease)    a. 2004 Cardiac Arrest/CABG x 3 (LIMA->LAD, VG->OM, VG->RCA);  b. 06/2015 lexiscan MV: no significant ischemia, EF 48%, low risk->Med Rx.  . Chronic combined systolic and diastolic CHF (congestive heart failure) ([place])    a. 12/2014 Echo: EF 45-50%;  b. 08/2015 Echo: EF 45-50%, ant, antsept HK, mildly dil LA, nl RV, mild-mod TR, sev PAH (57mmHg); b. 08/2017 TEE: EF 40-45%, large PFO w/ L->R shunting.  . CKD (chronic kidney disease), stage IV ([place])   . CLL (chronic lymphocytic leukemia) ([place])   . Diabetes mellitus without complication ([place])   . Essential hypertension   . GERD (gastroesophageal reflux disease)   . Hiatal hernia   . History of cardiac arrest    a. 2004.  Marland Kitchen Hyperlipidemia   . Ischemic cardiomyopathy    a.  s/p MDT ICD (originally had 5170 lead-->gen change and lead revision ~ 2012 @ [facility]); b. 12/2014 Echo: EF 45-50%;  c.08/2015 Echo: EF 45-50%; d. 08/2017 Echo: EF 40-45%.  Marland Kitchen PAD (peripheral artery disease) ([facility])    a. s/p bilat BKA  . Persistent atrial fibrillation ([place])    a. Dx 12/2014.  CHA2DS2VASc = 6--> warfarin;  b. 09/2015 s/p DCCV-->on amio; c. s/p DCCV 04/25/16; d. 07/2016 s/p RFCA/PVI in setting of recurrent afib despite amio; e. 05/2018 Recurrent afib.      Past Surgical History:  Procedure Laterality Date  . ABDOMINAL HYSTERECTOMY    . Amputation lower extremity bilaterally Bilateral   . CAPD INSERTION N/A 10/25/2018   Procedure: LAPAROSCOPIC INSERTION CONTINUOUS AMBULATORY PERITONEAL DIALYSIS  (CAPD) CATHETER;  Surgeon: Katha Cabal, MD;  Location: [facility] ORS;  Service: Vascular;  Laterality: N/A;  . CARDIAC CATHETERIZATION    . CARDIOVERSION N/A 10/09/2016   Procedure: CARDIOVERSION;  Surgeon: Lelon Perla, MD;  Location: [facility] ENDOSCOPY;  Service: Cardiovascular;  Laterality: N/A;  . CORONARY ARTERY BYPASS GRAFT    . CORONARY STENT INTERVENTION N/A 11/20/2018   Procedure: CORONARY STENT INTERVENTION;  Surgeon: Isaias Cowman, MD;  Location: [place] [facility] LAB;  Service: Cardiovascular;  Laterality: N/A;  SVG- OM  . DIALYSIS/PERMA CATHETER INSERTION N/A 10/22/2018   Procedure: DIALYSIS/PERMA CATHETER INSERTION;  Surgeon: Katha Cabal, MD;  Location: [place] [facility];  Service: Cardiovascular;  Laterality: N/A;  . DIALYSIS/PERMA CATHETER INSERTION N/A 06/30/2019   Procedure: DIALYSIS/PERMA CATHETER INSERTION;  Surgeon: Algernon Huxley, MD;  Location: [place] [facility];  Service: Cardiovascular;  Laterality: N/A;  . DIALYSIS/PERMA CATHETER REMOVAL N/A 01/06/2019   Procedure: DIALYSIS/PERMA CATHETER REMOVAL;  Surgeon: Algernon Huxley, MD;  Location: [place] [facility];  Service: Cardiovascular;  Laterality: N/A;  . ELECTROPHYSIOLOGIC STUDY N/A 10/07/2015   Procedure: CARDIOVERSION;  Surgeon: Minna Merritts, MD;  Location: [facility] ORS;  Service: Cardiovascular;  Laterality: N/A;  . ELECTROPHYSIOLOGIC STUDY N/A 04/25/2016   Procedure: Cardioversion;  Surgeon: Minna Merritts, MD;  Location: [facility] ORS;  Service: Cardiovascular;  Laterality: N/A;  . ELECTROPHYSIOLOGIC STUDY N/A 07/18/2016   Procedure: Atrial Fibrillation Ablation;  Surgeon: Thompson Grayer, MD;  Location: [place] [facility];  Service: Cardiovascular;  Laterality: N/A;  . IMPLANTABLE CARDIOVERTER DEFIBRILLATOR IMPLANT  2005   [employer]   . LEFT HEART CATH AND CORS/GRAFTS ANGIOGRAPHY N/A 11/20/2018   Procedure: LEFT HEART CATH AND CORS/GRAFTS ANGIOGRAPHY;  Surgeon: Minna Merritts, MD;  Location: [place] [facility];  Service: Cardiovascular;  Laterality: N/A;  . TEE WITHOUT CARDIOVERSION N/A 07/18/2016   Procedure: TRANSESOPHAGEAL ECHOCARDIOGRAM (TEE);  Surgeon: Sueanne Margarita, MD;  Location: [facility] ENDOSCOPY;  Service:  Cardiovascular;  Laterality: N/A;  . TEE WITHOUT CARDIOVERSION N/A 10/09/2016   Procedure: TRANSESOPHAGEAL ECHOCARDIOGRAM (TEE);  Surgeon: Lelon Perla, MD;  Location: [facility] ENDOSCOPY;  Service: Cardiovascular;  Laterality: N/A;      Social History:      Social History   Tobacco Use  . Smoking status: Never Smoker  . Smokeless tobacco: Never Used  Substance Use Topics  . Alcohol use: No       Family History :     Family History  Problem Relation Age of Onset  . CAD Mother   . Diabetes Mother   . Atrial fibrillation Mother   . Lung cancer Father   . Diabetes Father        Home Medications:   Prior to  Admission medications   Medication Sig Start Date End Date Taking? Authorizing Provider  amitriptyline (ELAVIL) 25 MG tablet Take 25 mg by mouth at bedtime.    Yes [provider]  amLODipine (NORVASC) 10 MG tablet TAKE 1 TABLET BY MOUTH DAILY 06/02/19  Yes Gollan, Kathlene November, MD  apixaban (ELIQUIS) 5 MG TABS tablet Take 1 tablet (5 mg total) by mouth 2 (two) times daily. 11/01/18  Yes Deboraha Sprang, MD  atorvastatin (LIPITOR) 40 MG tablet Take 40 mg by mouth at bedtime.    Yes [provider]  Cholecalciferol (HM VITAMIN D3) 100 MCG (4000 UT) CAPS Take 4,000 Units by mouth daily.    Yes [provider]  CIPRO 500 MG tablet Take 500 mg by mouth daily. 08/02/19  Yes [provider]  clopidogrel (PLAVIX) 75 MG tablet Take 1 tablet (75 mg) by mouth once daily 12/26/18  Yes Gollan, Kathlene November, MD  gabapentin (NEURONTIN) 100 MG capsule Take 1 capsule (100 mg total) by mouth 2 (two) times daily. 06/29/19  Yes Fritzi Mandes, MD  insulin aspart (NOVOLOG) 100 UNIT/ML injection Inject 4 Units into the skin 3 (three) times daily with meals. 10 units into the skin before breakfast then 5 units before lunch then 10 units before supper (evening meal) 06/29/19  Yes Fritzi Mandes, MD  insulin glargine (LANTUS) 100 UNIT/ML injection Inject 0.12 mLs (12 Units  total) into the skin daily. 06/29/19  Yes Fritzi Mandes, MD  iron polysaccharides (NIFEREX) 150 MG capsule Take 150 mg by mouth daily.   Yes [provider]  isosorbide mononitrate (IMDUR) 30 MG 24 hr tablet Take 1 tablet (30 mg total) by mouth daily. 06/30/19  Yes Fritzi Mandes, MD  Multiple Vitamins-Minerals (VITEYES AREDS FORMULA/LUTEIN) CAPS Take 1 capsule by mouth 2 (two) times daily.   Yes [provider]  nystatin (MYCOSTATIN/NYSTOP) powder Apply topically 3 (three) times daily. 06/29/19  Yes Fritzi Mandes, MD  pantoprazole (PROTONIX) 40 MG tablet Take 40 mg by mouth at bedtime. 06/19/16  Yes [provider]  sertraline (ZOLOFT) 100 MG tablet Take 100 mg by mouth at bedtime.  06/02/15  Yes [provider]  sevelamer carbonate (RENVELA) 800 MG tablet Take 3 tablets (2,400 mg total) by mouth 3 (three) times daily with meals. 06/29/19  Yes Fritzi Mandes, MD  torsemide (DEMADEX) 20 MG tablet TAKE 2 TABLETS(40 MG) BY MOUTH TWICE DAILY *NEEDS OFFICE VISIT FOR FURTHER REFILLS. PLEASE CALL [phone] TO SCHEDULE.* 07/17/19  Yes Theora Gianotti, NP  vitamin C (ASCORBIC ACID) 500 MG tablet Take 500 mg by mouth daily.   Yes [provider]  nitroGLYCERIN (NITROSTAT) 0.4 MG SL tablet Place 1 tablet (0.4 mg total) under the tongue every 5 (five) minutes x 3 doses as needed for chest pain. 11/23/18   Ripley Fraise, PA     Allergies:     Allergies  Allergen Reactions  . Piperacillin Other (See Comments)    Renal failure  . 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 See ID note**PER PT-CAUSED RENAL FAILURE**  . Zosyn [Piperacillin Sod-Tazobactam So] Other (See Comments)    Renal failure     Physical Exam:   Vitals  Blood pressure 114/67, pulse (!) 45, temperature 98.1 F (36.7 C), temperature source Oral, resp. rate 18, height 5\' 2"  (1.575 m), weight 95.3 kg, SpO2 93 %.  1.  General: axoxo3  2.  Psychiatric: euthymic  3. Neurologic: nonfocal  4. HEENMT:  Anicteric, pupils 1.88mm symmetric, direct, consensual, near intact Neck: no jvd  5. Respiratory : CTAB  6. Cardiovascular : rrr s1, s2,   7. Gastrointestinal:  Abd: soft, nt, nd,  + bs,  PD catheter in the L side HD catheter in the right upper chest  8. Skin:  Ext: no c/c/e,  No rash  9.Musculoskeletal:  Good ROM    Data Review:    CBC Recent Labs  Lab 08/06/19 1629  WBC 25.5*  HGB 10.4*  HCT 34.4*  PLT 182  MCV 91.5  MCH 27.7  MCHC 30.2  RDW 17.6*   ------------------------------------------------------------------------------------------------------------------  Results for orders placed or performed during the hospital encounter of 08/06/19 (from the past 48 hour(s))  Basic metabolic panel     Status: Abnormal   Collection Time: 08/06/19  4:29 PM  Result Value Ref Range   Sodium 134 (L) 135 - 145 mmol/L   Potassium 2.8 (L) 3.5 - 5.1 mmol/L   Chloride 97 (L) 98 - 111 mmol/L   CO2 25 22 - 32 mmol/L   Glucose, Bld 207 (H) 70 - 99 mg/dL   BUN 9 8 - 23 mg/dL   Creatinine, Ser 2.96 (H) 0.44 - 1.00 mg/dL   Calcium 8.0 (L) 8.9 - 10.3 mg/dL   GFR calc non Af Amer 15 (L) >60 mL/min   GFR calc Af Amer 18 (L) >60 mL/min   Anion gap 12 5 - 15    Comment: Performed at [facility], [address] [place], [place] [address]  CBC     Status: Abnormal   Collection Time: 08/06/19  4:29 PM  Result Value Ref Range   WBC 25.5 (H) 4.0 - 10.5 K/uL   RBC 3.76 (L) 3.87 - 5.11 MIL/uL   Hemoglobin 10.4 (L) 12.0 - 15.0 g/dL   HCT 34.4 (L) 36.0 - 46.0 %   MCV 91.5 80.0 - 100.0 fL   MCH 27.7 26.0 - 34.0 pg   MCHC 30.2 30.0 - 36.0 g/dL   RDW 17.6 (H) 11.5 - 15.5 %   Platelets 182 150 - 400 K/uL   nRBC 0.0 0.0 - 0.2 %    Comment: Performed at [facility], [address], [place], [place] [address]  Troponin I (High Sensitivity)     Status: Abnormal   Collection Time: 08/06/19  4:29 PM   Result Value Ref Range   Troponin I (High Sensitivity) 21 (H) <18 ng/L    Comment: (NOTE) Elevated high sensitivity troponin I (hsTnI) values and significant  changes across serial measurements may suggest ACS but many other  chronic and acute conditions are known to elevate hsTnI results.  Refer to the "Links" section for chest pain algorithms and additional  guidance. Performed at [facility], [place], [place] [address]   Troponin I (High Sensitivity)     Status: Abnormal   Collection Time: 08/06/19  9:30 PM  Result Value Ref Range   Troponin I (High Sensitivity) 21 (H) <18 ng/L    Comment: (NOTE) Elevated high sensitivity troponin I (hsTnI) values and significant  changes across serial measurements may suggest ACS but many other  chronic and acute conditions are known to elevate hsTnI results.  Refer to the "Links" section for chest pain algorithms and additional  guidance. Performed at [facility], [address] [place], [place] [address]   Urinalysis, Complete w Microscopic     Status: Abnormal   Collection Time: 08/06/19 10:41 PM  Result Value Ref Range   Color, Urine YELLOW (A) YELLOW   APPearance TURBID (A) CLEAR   Specific Gravity, Urine 1.006 1.005 - 1.030   pH 5.0 5.0 - 8.0   Glucose, UA NEGATIVE NEGATIVE mg/dL   Hgb urine dipstick MODERATE (A) NEGATIVE   Bilirubin Urine NEGATIVE NEGATIVE   Ketones, ur NEGATIVE NEGATIVE mg/dL   Protein, ur 100 (A) NEGATIVE mg/dL   Nitrite NEGATIVE NEGATIVE   Leukocytes,Ua LARGE (A) NEGATIVE   RBC / HPF >50 (H) 0 - 5 RBC/hpf   WBC, UA >50 (H) 0 - 5 WBC/hpf   Bacteria, UA MANY (A) NONE SEEN   Squamous Epithelial / LPF NONE SEEN 0 - 5   WBC Clumps PRESENT     Comment: Performed at [facility], [address] [place], [place] [address]  Lactic acid, plasma     Status: None   Collection Time: 08/06/19 11:44 PM  Result Value Ref Range   Lactic Acid, Venous 1.2 0.5 - 1.9 mmol/L     Comment: Performed at [facility], [address] [place], [place] [address]    Chemistries  Recent Labs  Lab 08/06/19 1629  NA 134*  K 2.8*  CL 97*  CO2 25  GLUCOSE 207*  BUN 9  CREATININE 2.96*  CALCIUM 8.0*   ------------------------------------------------------------------------------------------------------------------  ------------------------------------------------------------------------------------------------------------------ GFR: Estimated Creatinine Clearance: 18.8 mL/min (A) (by C-G formula based on SCr of 2.96 mg/dL (H)). Liver Function Tests: No results for input(s): AST, ALT, ALKPHOS, BILITOT, PROT, ALBUMIN in the last 168 hours. No results for input(s): LIPASE, AMYLASE in the last 168 hours. No results for input(s): AMMONIA in the last 168 hours. Coagulation Profile: No results for input(s): INR, PROTIME in the last 168 hours. Cardiac Enzymes: No results for input(s): CKTOTAL, CKMB, CKMBINDEX, TROPONINI in the last 168 hours. BNP (last 3 results) No results for input(s): PROBNP in the last 8760 hours. HbA1C: No results for input(s): HGBA1C in the last 72 hours. CBG: No results for input(s): GLUCAP in the last 168 hours. Lipid Profile: No results for input(s): CHOL, HDL, LDLCALC, TRIG, CHOLHDL, LDLDIRECT in the last 72 hours. Thyroid Function Tests: No results for input(s): TSH, T4TOTAL, FREET4, T3FREE, THYROIDAB in the last 72 hours. Anemia Panel: No results for input(s): VITAMINB12, FOLATE, FERRITIN, TIBC, IRON, RETICCTPCT in the last 72 hours.  --------------------------------------------------------------------------------------------------------------- Urine analysis:    Component Value Date/Time   COLORURINE YELLOW (A) 08/06/2019 2241   APPEARANCEUR TURBID (A) 08/06/2019 2241   LABSPEC 1.006 08/06/2019 2241   PHURINE 5.0 08/06/2019 2241   GLUCOSEU NEGATIVE 08/06/2019 2241   HGBUR MODERATE (A) 08/06/2019 2241   BILIRUBINUR  NEGATIVE 08/06/2019 2241   KETONESUR NEGATIVE 08/06/2019 2241   PROTEINUR 100 (A) 08/06/2019 2241   NITRITE NEGATIVE 08/06/2019 2241   LEUKOCYTESUR LARGE (A) 08/06/2019 2241      Imaging Results:    Dg Chest 2 View  Result Date: 08/06/2019 CLINICAL DATA:  Chest pain. Weakness for 1 months, worse in today. Shortness of breath. Patient from dialysis. EXAM: CHEST - 2 VIEW COMPARISON:  Chest radiograph 06/25/2019 FINDINGS: Right-sided dialysis catheter tip in the lower SVC. Left-sided pacemaker in place. Post median sternotomy. Unchanged cardiomegaly. Small left pleural effusion and adjacent atelectasis. Minimal fluid in the fissures. Possible small right pleural effusion. No pulmonary edema. No pneumothorax. Degenerative change in the spine. No acute osseous abnormalities. IMPRESSION: 1. Small left pleural effusion with adjacent atelectasis. 2. Possible small right pleural effusion. 3. Stable cardiomegaly.  No pulmonary  edema. Electronically Signed   By: Keith Rake M.D.   On: 08/06/2019 17:37   Ct Chest Wo Contrast  Result Date: 08/06/2019 CLINICAL DATA:  Shortness of breath. Rales. Patient reports weakness for 4 months, worse today. EXAM: CT CHEST WITHOUT CONTRAST TECHNIQUE: Multidetector CT imaging of the chest was performed following the standard protocol without IV contrast. COMPARISON:  Radiograph earlier this day. Chest CT 03/07/2018 FINDINGS: Cardiovascular: Right-sided dialysis catheter tip at the atrial caval junction. Left-sided pacemaker tips in the right atrium and ventricle. Multi chamber cardiomegaly. Coronary artery calcifications. Aortic atherosclerosis without aneurysm. No pericardial effusion. Post CABG. Mediastinum/Nodes: Small mediastinal lymph nodes, enlarged by size criteria. Limited assessment for hilar adenopathy given lack of IV contrast. Prominent bilateral axillary nodes are detained normal lymph node morphology. Slightly patulous esophagus without wall thickening. No  suspicious thyroid nodule. Lungs/Pleura: Small bilateral pleural effusions, left greater than right. Mild heterogeneous airway 10 UA shin throughout both lungs. Linear atelectasis in the right middle lobe and anterior right upper lobe. Compressive atelectasis in the lung bases adjacent to pleural fluid. No pulmonary edema. Trachea and mainstem bronchi are patent. No pulmonary mass. Upper Abdomen: Stones in the gallbladder, no gallbladder inflammation or visualized. Advanced atherosclerosis of upper abdominal vasculature. No acute findings. Musculoskeletal: Prior median sternotomy. There are no acute or suspicious osseous abnormalities. IMPRESSION: 1. Small bilateral pleural effusions, left greater than right. Compressive atelectasis in the lung bases adjacent to pleural fluid. 2. Slight heterogeneous parenchymal attenuation can be seen with small airways disease. 3. Multi chamber cardiomegaly. Coronary artery calcifications. No pulmonary edema. 4. Incidental cholelithiasis. Aortic Atherosclerosis (ICD10-I70.0). Electronically Signed   By: Keith Rake M.D.   On: 08/06/2019 21:26      Assessment & Plan:    Principal Problem:   Acute lower UTI Active Problems:   CKD (chronic kidney disease), stage IV ([facility])   Acute lower UTI Urine culture Rocephin 1mg  iv qday  Hypokalemia Replete Check cmp in am  Leukocytosis Secondary to UTI Abx as above Check cbc in am  Dm2 w neuropathy Cont Lantus Cont Gabapentin Fsbs ac and qhs, ISS  ESRD on PD Cont Renvela 2400mg  po tid  Please consult nephrology in AM  Anemia Check cbc in am  Gerd Cont PPI  CAD / PVD s/p Bilateral BKA Cont Imdur 30mg  po qday Cont Amlodipine 10mg  po qday Cont Plavix 75mg  po qday Cont Lipitor 40mg  po qhs  CHF (systolic) Cont Torsemide  Pafib Cont Eliquis   Anemia Cont Ferrous sulfate Check cbc in am     DVT Prophylaxis-   Eliquis  SCDs   AM Labs Ordered, also please review Full Orders  Family  Communication: Admission, patients condition and plan of care including tests being ordered have been discussed with the patient  who indicate understanding and agree with the plan and Code Status.  Code Status:   FULL CODE per patient  Admission status:  Inpatient: Based on patients clinical presentation and evaluation of above clinical data, I have made determination that patient meets Inpatient criteria at this time.  Pt has high risk of clinic deteriration.  Pt has uti w significant leukocytosis wbc 25.  Pt will require iv abx, and also >2 nites stay.    Time spent in minutes : 70    Jani Gravel M.D on 08/07/2019 at 2:12 AM

## 2019-08-07 NOTE — Progress Notes (Signed)
ANTICOAGULATION CONSULT NOTE - Initial Consult  Pharmacy Consult for Apixaban Indication: atrial fibrillation  Allergies  Allergen Reactions  . Piperacillin Other (See Comments)    Renal failure  . 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 See ID note**PER PT-CAUSED RENAL FAILURE**  . Zosyn [Piperacillin Sod-Tazobactam So] Other (See Comments)    Renal failure    Patient Measurements: Height: 5\' 2"  (157.5 cm) Weight: 228 lb (103.4 kg) IBW/kg (Calculated) : 50.1  Vital Signs: Temp: 98.8 F (37.1 C) (11/26 0331) Temp Source: Oral (11/26 0331) BP: 111/61 (11/26 0331) Pulse Rate: 61 (11/26 0331)  Labs: Recent Labs    08/06/19 1629 08/06/19 2130  HGB 10.4*  --   HCT 34.4*  --   PLT 182  --   CREATININE 2.96*  --   TROPONINIHS 21* 21*   Estimated Creatinine Clearance: 19.6 mL/min (A) (by C-G formula based on SCr of 2.96 mg/dL (H)).  Medical History: Past Medical History:  Diagnosis Date  . Amputation of left lower extremity below knee ([place])   . Amputation of right lower extremity below knee ([place])   . CAD (coronary artery disease)    a. 2004 Cardiac Arrest/CABG x 3 (LIMA->LAD, VG->OM, VG->RCA);  b. 06/2015 lexiscan MV: no significant ischemia, EF 48%, low risk->Med Rx.  . Chronic combined systolic and diastolic CHF (congestive heart failure) ([place])    a. 12/2014 Echo: EF 45-50%;  b. 08/2015 Echo: EF 45-50%, ant, antsept HK, mildly dil LA, nl RV, mild-mod TR, sev PAH (55mmHg); b. 08/2017 TEE: EF 40-45%, large PFO w/ L->R shunting.  . CKD (chronic kidney disease), stage IV ([place])   . CLL (chronic lymphocytic leukemia) ([place])   . Diabetes mellitus without complication ([place])   . Essential hypertension   . GERD (gastroesophageal reflux disease)   . Hiatal hernia   . History of cardiac arrest    a. 2004.  Marland Kitchen Hyperlipidemia   . Ischemic cardiomyopathy    a. s/p MDT ICD (originally had 0947 lead-->gen change and lead revision ~ 2012 @ [place]);  b. 12/2014 Echo: EF 45-50%;  c.08/2015 Echo: EF 45-50%; d. 08/2017 Echo: EF 40-45%.  Marland Kitchen PAD (peripheral artery disease) ([facility])    a. s/p bilat BKA  . Persistent atrial fibrillation ([place])    a. Dx 12/2014.  CHA2DS2VASc = 6--> warfarin;  b. 09/2015 s/p DCCV-->on amio; c. s/p DCCV 04/25/16; d. 07/2016 s/p RFCA/PVI in setting of recurrent afib despite amio; e. 05/2018 Recurrent afib.    Medications:  Medications Prior to Admission  Medication Sig Dispense Refill Last Dose  . amitriptyline (ELAVIL) 25 MG tablet Take 25 mg by mouth at bedtime.   11 Past Week at Unknown time  . amLODipine (NORVASC) 10 MG tablet TAKE 1 TABLET BY MOUTH DAILY 90 tablet 0 Past Week at Unknown time  . apixaban (ELIQUIS) 5 MG TABS tablet Take 1 tablet (5 mg total) by mouth 2 (two) times daily. 60 tablet 9 Past Week at Unknown time  . atorvastatin (LIPITOR) 40 MG tablet Take 40 mg by mouth at bedtime.   11 Past Week at Unknown time  . Cholecalciferol (HM VITAMIN D3) 100 MCG (4000 UT) CAPS Take 4,000 Units by mouth daily.    Past Week at Unknown time  . CIPRO 500 MG tablet Take 500 mg by mouth daily.   Past Week at Unknown time  . clopidogrel (PLAVIX) 75 MG tablet Take 1 tablet (75 mg) by mouth once daily 30 tablet  6 Past Week at Unknown time  . gabapentin (NEURONTIN) 100 MG capsule Take 1 capsule (100 mg total) by mouth 2 (two) times daily. 60 capsule 0 Past Week at Unknown time  . insulin aspart (NOVOLOG) 100 UNIT/ML injection Inject 4 Units into the skin 3 (three) times daily with meals. 10 units into the skin before breakfast then 5 units before lunch then 10 units before supper (evening meal) 10 mL 11 Past Week at Unknown time  . insulin glargine (LANTUS) 100 UNIT/ML injection Inject 0.12 mLs (12 Units total) into the skin daily. 10 mL 1 Past Week at Unknown time  . iron polysaccharides (NIFEREX) 150 MG capsule Take 150 mg by mouth daily.   Past Week at Unknown time  . isosorbide mononitrate (IMDUR) 30 MG 24 hr tablet Take 1  tablet (30 mg total) by mouth daily. 30 tablet 1 Past Week at Unknown time  . Multiple Vitamins-Minerals (VITEYES AREDS FORMULA/LUTEIN) CAPS Take 1 capsule by mouth 2 (two) times daily.   Past Week at Unknown time  . nystatin (MYCOSTATIN/NYSTOP) powder Apply topically 3 (three) times daily. 15 g 0 Past Week at Unknown time  . pantoprazole (PROTONIX) 40 MG tablet Take 40 mg by mouth at bedtime.   Past Week at Unknown time  . sertraline (ZOLOFT) 100 MG tablet Take 100 mg by mouth at bedtime.   11 Past Week at Unknown time  . sevelamer carbonate (RENVELA) 800 MG tablet Take 3 tablets (2,400 mg total) by mouth 3 (three) times daily with meals. 90 tablet 1 Past Week at Unknown time  . torsemide (DEMADEX) 20 MG tablet TAKE 2 TABLETS(40 MG) BY MOUTH TWICE DAILY *NEEDS OFFICE VISIT FOR FURTHER REFILLS. PLEASE CALL [phone] TO SCHEDULE.* 120 tablet 0 Past Week at Unknown time  . vitamin C (ASCORBIC ACID) 500 MG tablet Take 500 mg by mouth daily.   Past Week at Unknown time  . nitroGLYCERIN (NITROSTAT) 0.4 MG SL tablet Place 1 tablet (0.4 mg total) under the tongue every 5 (five) minutes x 3 doses as needed for chest pain. 30 tablet 0     Assessment: Pharmacy asked to monitor Apixaban for afib.  Pt's home dose = 5mg  BID, pt reported taking w/in last week.  SCr elevated.  Estimated Creatinine Clearance: 19.6 mL/min (A) (by C-G formula based on SCr of 2.96 mg/dL (H)).  Goal of Therapy:  Therapeutic anticoagulation Monitor platelets by anticoagulation protocol: Yes   Plan:  Apixaban 2.5mg  x 1 dose then f/u labs for additional dosing or consider alternative Rx due to elevated SCr  Caroline Hernandez, Caroline Hernandez A 08/07/2019,3:57 AM

## 2019-08-07 NOTE — Consult Note (Signed)
PHARMACY CONSULT NOTE  Pharmacy Consult for Electrolyte Monitoring and Replacement   Recent Labs: Potassium (mmol/L)  Date Value  08/07/2019 3.1 (L)   Magnesium (mg/dL)  Date Value  08/07/2019 1.8   Calcium (mg/dL)  Date Value  08/07/2019 8.2 (L)   Albumin (g/dL)  Date Value  08/07/2019 2.9 (L)  11/18/2015 4.1   Phosphorus (mg/dL)  Date Value  06/26/2019 8.8 (H)   Sodium (mmol/L)  Date Value  08/07/2019 135  10/07/2018 142   Corrected Ca: 9.1 mg/dL  Assessment: 73 y.o. female,  w hypertension, hyperlipidemia, DM2, ESRD on PD, PAD s/p bilateral BKA, CAD, CHF (EF 45%), Chronic Afib, CLL presents with c/o weakness related to hypotension. She was on torsemide PTA which has been continued at a reduced dose of 20 mg BID  Goal of Therapy:  Given cardiac history: Potassium 4.0 - 5.1 mmol/L Magnesium 2.0 - 2.4 mg/dL Other electrolytes WNL  Plan:   Replace potassium with a total of 80 mEq oral KCl  Replace magnesium with 1 gram IV magnesium sulfate  Check BMP and magnesium in am  Dallie Piles ,PharmD Clinical Pharmacist 08/07/2019 1:49 PM

## 2019-08-07 NOTE — Progress Notes (Signed)
[place] for Apixaban Indication: atrial fibrillation  Patient Measurements: Height: 5\' 2"  (157.5 cm) Weight: 228 lb (103.4 kg) IBW/kg (Calculated) : 50.1  Vital Signs: Temp: 98.1 F (36.7 C) (11/26 0756) Temp Source: Oral (11/26 0756) BP: 96/52 (11/26 0900) Pulse Rate: 56 (11/26 0756)  Labs: Recent Labs    08/06/19 1629 08/06/19 2130 08/07/19 0446  HGB 10.4*  --  9.4*  HCT 34.4*  --  28.8*  PLT 182  --  165  CREATININE 2.96*  --  3.62*  TROPONINIHS 21* 21*  --    Estimated Creatinine Clearance: 16.1 mL/min (A) (by C-G formula based on SCr of 3.62 mg/dL (H)).  Medical History: Past Medical History:  Diagnosis Date  . Amputation of left lower extremity below knee ([place])   . Amputation of right lower extremity below knee ([place])   . CAD (coronary artery disease)    a. 2004 Cardiac Arrest/CABG x 3 (LIMA->LAD, VG->OM, VG->RCA);  b. 06/2015 lexiscan MV: no significant ischemia, EF 48%, low risk->Med Rx.  . Chronic combined systolic and diastolic CHF (congestive heart failure) ([place])    a. 12/2014 Echo: EF 45-50%;  b. 08/2015 Echo: EF 45-50%, ant, antsept HK, mildly dil LA, nl RV, mild-mod TR, sev PAH (97mmHg); b. 08/2017 TEE: EF 40-45%, large PFO w/ L->R shunting.  . CKD (chronic kidney disease), stage IV ([place])   . CLL (chronic lymphocytic leukemia) ([place])   . Diabetes mellitus without complication ([place])   . Essential hypertension   . GERD (gastroesophageal reflux disease)   . Hiatal hernia   . History of cardiac arrest    a. 2004.  Marland Kitchen Hyperlipidemia   . Ischemic cardiomyopathy    a. s/p MDT ICD (originally had 8295 lead-->gen change and lead revision ~ 2012 @ [place]); b. 12/2014 Echo: EF 45-50%;  c.08/2015 Echo: EF 45-50%; d. 08/2017 Echo: EF 40-45%.  Marland Kitchen PAD (peripheral artery disease) ([facility])    a. s/p bilat BKA  . Persistent atrial fibrillation ([place])    a. Dx 12/2014.  CHA2DS2VASc = 6--> warfarin;  b. 09/2015 s/p DCCV-->on amio; c. s/p DCCV  04/25/16; d. 07/2016 s/p RFCA/PVI in setting of recurrent afib despite amio; e. 05/2018 Recurrent afib.    Medications:  Medications Prior to Admission  Medication Sig Dispense Refill Last Dose  . amitriptyline (ELAVIL) 25 MG tablet Take 25 mg by mouth at bedtime.   11 Past Week at Unknown time  . amLODipine (NORVASC) 10 MG tablet TAKE 1 TABLET BY MOUTH DAILY 90 tablet 0 Past Week at Unknown time  . apixaban (ELIQUIS) 5 MG TABS tablet Take 1 tablet (5 mg total) by mouth 2 (two) times daily. 60 tablet 9 Past Week at Unknown time  . atorvastatin (LIPITOR) 40 MG tablet Take 40 mg by mouth at bedtime.   11 Past Week at Unknown time  . Cholecalciferol (HM VITAMIN D3) 100 MCG (4000 UT) CAPS Take 4,000 Units by mouth daily.    Past Week at Unknown time  . CIPRO 500 MG tablet Take 500 mg by mouth daily.   Past Week at Unknown time  . clopidogrel (PLAVIX) 75 MG tablet Take 1 tablet (75 mg) by mouth once daily 30 tablet 6 Past Week at Unknown time  . gabapentin (NEURONTIN) 100 MG capsule Take 1 capsule (100 mg total) by mouth 2 (two) times daily. 60 capsule 0 Past Week at Unknown time  . insulin aspart (NOVOLOG) 100 UNIT/ML injection Inject 4 Units into the skin  3 (three) times daily with meals. 10 units into the skin before breakfast then 5 units before lunch then 10 units before supper (evening meal) 10 mL 11 Past Week at Unknown time  . insulin glargine (LANTUS) 100 UNIT/ML injection Inject 0.12 mLs (12 Units total) into the skin daily. 10 mL 1 Past Week at Unknown time  . iron polysaccharides (NIFEREX) 150 MG capsule Take 150 mg by mouth daily.   Past Week at Unknown time  . isosorbide mononitrate (IMDUR) 30 MG 24 hr tablet Take 1 tablet (30 mg total) by mouth daily. 30 tablet 1 Past Week at Unknown time  . Multiple Vitamins-Minerals (VITEYES AREDS FORMULA/LUTEIN) CAPS Take 1 capsule by mouth 2 (two) times daily.   Past Week at Unknown time  . nystatin (MYCOSTATIN/NYSTOP) powder Apply topically 3 (three)  times daily. 15 g 0 Past Week at Unknown time  . pantoprazole (PROTONIX) 40 MG tablet Take 40 mg by mouth at bedtime.   Past Week at Unknown time  . sertraline (ZOLOFT) 100 MG tablet Take 100 mg by mouth at bedtime.   11 Past Week at Unknown time  . sevelamer carbonate (RENVELA) 800 MG tablet Take 3 tablets (2,400 mg total) by mouth 3 (three) times daily with meals. 90 tablet 1 Past Week at Unknown time  . torsemide (DEMADEX) 20 MG tablet TAKE 2 TABLETS(40 MG) BY MOUTH TWICE DAILY *NEEDS OFFICE VISIT FOR FURTHER REFILLS. PLEASE CALL [phone] TO SCHEDULE.* 120 tablet 0 Past Week at Unknown time  . vitamin C (ASCORBIC ACID) 500 MG tablet Take 500 mg by mouth daily.   Past Week at Unknown time  . nitroGLYCERIN (NITROSTAT) 0.4 MG SL tablet Place 1 tablet (0.4 mg total) under the tongue every 5 (five) minutes x 3 doses as needed for chest pain. 30 tablet 0     Assessment: 73 y.o. female,  w hypertension, hyperlipidemia, Dm2, ESRD on PD, PAD s/p bilateral BKA, CAD, CHF (EF 45%), Chronic Afib, CLL presents with c/o weakness thought to be due to a UTI. Pharmacy asked to monitor Apixaban for afib.  Pt's home dose is 5mg  BID, pt reported taking w/in last week.  SCr elevated above baseline, H&H low at baseline, PLT wnl  Goal of Therapy:  Therapeutic anticoagulation Monitor platelets by anticoagulation protocol: Yes   Plan:   This patient meets only 1/3 dose reduction criterea  Start apixaban 5mg  BID  CBC at least every 3 days  Dallie Piles 08/07/2019,10:06 AM

## 2019-08-07 NOTE — ED Notes (Signed)
ED TO INPATIENT HANDOFF REPORT  ED Nurse Name and Phone #: Johnney Ou [phone]  S Name/Age/Gender Caroline Hernandez 73 y.o. female Room/Bed: ED10A/ED10A  Code Status   Code Status: Full Code  Home/SNF/Other Was discharged from Rehab unable to go home due to weakness Patient oriented to: self, place, time and situation Is this baseline? Yes   Triage Complete: Triage complete  Chief Complaint Weakness  Triage Note Pt from dialysis , c/o weakness x27months but worsening today. States SOB and CP. Denies cough or fever.    Allergies Allergies  Allergen Reactions  . Piperacillin Other (See Comments)    Renal failure  . 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 See ID note**PER PT-CAUSED RENAL FAILURE**  . Zosyn [Piperacillin Sod-Tazobactam So] Other (See Comments)    Renal failure    Level of Care/Admitting Diagnosis ED Disposition    ED Disposition Condition Comment   Admit  Hospital Area: [facility] [[id number]]  Level of Care: Telemetry [5]  Covid Evaluation: Asymptomatic Screening Protocol (No Symptoms)  Diagnosis: Acute lower UTI [841324]  Admitting Physician: Jani Gravel [[id number]]  Attending Physician: Jani Gravel [phone]  Estimated length of stay: past midnight tomorrow  Certification:: I certify this patient will need inpatient services for at least 2 midnights  PT Class (Do Not Modify): Inpatient [101]  PT Acc Code (Do Not Modify): Private [1]       B Medical/Surgery History Past Medical History:  Diagnosis Date  . Amputation of left lower extremity below knee ([place])   . Amputation of right lower extremity below knee ([place])   . CAD (coronary artery disease)    a. 2004 Cardiac Arrest/CABG x 3 (LIMA->LAD, VG->OM, VG->RCA);  b. 06/2015 lexiscan MV: no significant ischemia, EF 48%, low risk->Med Rx.  . Chronic combined systolic and diastolic CHF (congestive heart failure) ([place])    a. 12/2014 Echo: EF 45-50%;   b. 08/2015 Echo: EF 45-50%, ant, antsept HK, mildly dil LA, nl RV, mild-mod TR, sev PAH (98mmHg); b. 08/2017 TEE: EF 40-45%, large PFO w/ L->R shunting.  . CKD (chronic kidney disease), stage IV ([place])   . CLL (chronic lymphocytic leukemia) ([place])   . Diabetes mellitus without complication ([place])   . Essential hypertension   . GERD (gastroesophageal reflux disease)   . Hiatal hernia   . History of cardiac arrest    a. 2004.  Marland Kitchen Hyperlipidemia   . Ischemic cardiomyopathy    a. s/p MDT ICD (originally had 2725 lead-->gen change and lead revision ~ 2012 @ [facility]); b. 12/2014 Echo: EF 45-50%;  c.08/2015 Echo: EF 45-50%; d. 08/2017 Echo: EF 40-45%.  Marland Kitchen PAD (peripheral artery disease) ([facility])    a. s/p bilat BKA  . Persistent atrial fibrillation ([place])    a. Dx 12/2014.  CHA2DS2VASc = 6--> warfarin;  b. 09/2015 s/p DCCV-->on amio; c. s/p DCCV 04/25/16; d. 07/2016 s/p RFCA/PVI in setting of recurrent afib despite amio; e. 05/2018 Recurrent afib.   Past Surgical History:  Procedure Laterality Date  . ABDOMINAL HYSTERECTOMY    . Amputation lower extremity bilaterally Bilateral   . CAPD INSERTION N/A 10/25/2018   Procedure: LAPAROSCOPIC INSERTION CONTINUOUS AMBULATORY PERITONEAL DIALYSIS  (CAPD) CATHETER;  Surgeon: Katha Cabal, MD;  Location: [facility];  Service: Vascular;  Laterality: N/A;  . CARDIAC CATHETERIZATION    . CARDIOVERSION N/A 10/09/2016   Procedure: CARDIOVERSION;  Surgeon: Lelon Perla, MD;  Location: [facility];  Service: Cardiovascular;  Laterality:  N/A;  . CORONARY ARTERY BYPASS GRAFT    . CORONARY STENT INTERVENTION N/A 11/20/2018   Procedure: CORONARY STENT INTERVENTION;  Surgeon: Isaias Cowman, MD;  Location: [place] [facility];  Service: Cardiovascular;  Laterality: N/A;  SVG- OM  . DIALYSIS/PERMA CATHETER INSERTION N/A 10/22/2018   Procedure: DIALYSIS/PERMA CATHETER INSERTION;  Surgeon: Katha Cabal, MD;  Location: [facility] [facility];  Service: Cardiovascular;   Laterality: N/A;  . DIALYSIS/PERMA CATHETER INSERTION N/A 06/30/2019   Procedure: DIALYSIS/PERMA CATHETER INSERTION;  Surgeon: Algernon Huxley, MD;  Location: [place] [facility];  Service: Cardiovascular;  Laterality: N/A;  . DIALYSIS/PERMA CATHETER REMOVAL N/A 01/06/2019   Procedure: DIALYSIS/PERMA CATHETER REMOVAL;  Surgeon: Algernon Huxley, MD;  Location: [place] [facility];  Service: Cardiovascular;  Laterality: N/A;  . ELECTROPHYSIOLOGIC STUDY N/A 10/07/2015   Procedure: CARDIOVERSION;  Surgeon: Minna Merritts, MD;  Location: [facility];  Service: Cardiovascular;  Laterality: N/A;  . ELECTROPHYSIOLOGIC STUDY N/A 04/25/2016   Procedure: Cardioversion;  Surgeon: Minna Merritts, MD;  Location: [facility];  Service: Cardiovascular;  Laterality: N/A;  . ELECTROPHYSIOLOGIC STUDY N/A 07/18/2016   Procedure: Atrial Fibrillation Ablation;  Surgeon: Thompson Grayer, MD;  Location: [facility] [facility];  Service: Cardiovascular;  Laterality: N/A;  . IMPLANTABLE CARDIOVERTER DEFIBRILLATOR IMPLANT  2005   [employer]   . LEFT HEART CATH AND CORS/GRAFTS ANGIOGRAPHY N/A 11/20/2018   Procedure: LEFT HEART CATH AND CORS/GRAFTS ANGIOGRAPHY;  Surgeon: Minna Merritts, MD;  Location: [place] [facility];  Service: Cardiovascular;  Laterality: N/A;  . TEE WITHOUT CARDIOVERSION N/A 07/18/2016   Procedure: TRANSESOPHAGEAL ECHOCARDIOGRAM (TEE);  Surgeon: Sueanne Margarita, MD;  Location: [facility] ENDOSCOPY;  Service: Cardiovascular;  Laterality: N/A;  . TEE WITHOUT CARDIOVERSION N/A 10/09/2016   Procedure: TRANSESOPHAGEAL ECHOCARDIOGRAM (TEE);  Surgeon: Lelon Perla, MD;  Location: [facility] ENDOSCOPY;  Service: Cardiovascular;  Laterality: N/A;     A IV Location/Drains/Wounds Patient Lines/Drains/Airways Status   Active Line/Drains/Airways    Name:   Placement date:   Placement time:   Site:   Days:   Peripheral IV 08/06/19 Left Antecubital   08/06/19    2130    Antecubital   1   Hemodialysis Catheter Right Internal jugular Double  lumen Permanent (Tunneled)   06/30/19    1334    Internal jugular   38   Pressure Injury 06/25/19 Buttocks Right;Left;Medial Stage II -  Partial thickness loss of dermis presenting as a shallow open ulcer with a red, pink wound bed without slough.   06/25/19    1600     43          Intake/Output Last 24 hours No intake or output data in the 24 hours ending 08/07/19 0238  Labs/Imaging Results for orders placed or performed during the hospital encounter of 08/06/19 (from the past 48 hour(s))  Basic metabolic panel     Status: Abnormal   Collection Time: 08/06/19  4:29 PM  Result Value Ref Range   Sodium 134 (L) 135 - 145 mmol/L   Potassium 2.8 (L) 3.5 - 5.1 mmol/L   Chloride 97 (L) 98 - 111 mmol/L   CO2 25 22 - 32 mmol/L   Glucose, Bld 207 (H) 70 - 99 mg/dL   BUN 9 8 - 23 mg/dL   Creatinine, Ser 2.96 (H) 0.44 - 1.00 mg/dL   Calcium 8.0 (L) 8.9 - 10.3 mg/dL   GFR calc non Af Amer 15 (L) >60 mL/min   GFR calc  Af Amer 18 (L) >60 mL/min   Anion gap 12 5 - 15    Comment: Performed at [facility], [address] [place], [place] [address]  CBC     Status: Abnormal   Collection Time: 08/06/19  4:29 PM  Result Value Ref Range   WBC 25.5 (H) 4.0 - 10.5 K/uL   RBC 3.76 (L) 3.87 - 5.11 MIL/uL   Hemoglobin 10.4 (L) 12.0 - 15.0 g/dL   HCT 34.4 (L) 36.0 - 46.0 %   MCV 91.5 80.0 - 100.0 fL   MCH 27.7 26.0 - 34.0 pg   MCHC 30.2 30.0 - 36.0 g/dL   RDW 17.6 (H) 11.5 - 15.5 %   Platelets 182 150 - 400 K/uL   nRBC 0.0 0.0 - 0.2 %    Comment: Performed at [facility], [place], [place] [address]  Troponin I (High Sensitivity)     Status: Abnormal   Collection Time: 08/06/19  4:29 PM  Result Value Ref Range   Troponin I (High Sensitivity) 21 (H) <18 ng/L    Comment: (NOTE) Elevated high sensitivity troponin I (hsTnI) values and significant  changes across serial measurements may suggest ACS but many other  chronic and acute conditions are known to elevate  hsTnI results.  Refer to the "Links" section for chest pain algorithms and additional  guidance. Performed at [facility], [place], [place] [address]   Troponin I (High Sensitivity)     Status: Abnormal   Collection Time: 08/06/19  9:30 PM  Result Value Ref Range   Troponin I (High Sensitivity) 21 (H) <18 ng/L    Comment: (NOTE) Elevated high sensitivity troponin I (hsTnI) values and significant  changes across serial measurements may suggest ACS but many other  chronic and acute conditions are known to elevate hsTnI results.  Refer to the "Links" section for chest pain algorithms and additional  guidance. Performed at [facility], [address] [place], [place] [address]   Urinalysis, Complete w Microscopic     Status: Abnormal   Collection Time: 08/06/19 10:41 PM  Result Value Ref Range   Color, Urine YELLOW (A) YELLOW   APPearance TURBID (A) CLEAR   Specific Gravity, Urine 1.006 1.005 - 1.030   pH 5.0 5.0 - 8.0   Glucose, UA NEGATIVE NEGATIVE mg/dL   Hgb urine dipstick MODERATE (A) NEGATIVE   Bilirubin Urine NEGATIVE NEGATIVE   Ketones, ur NEGATIVE NEGATIVE mg/dL   Protein, ur 100 (A) NEGATIVE mg/dL   Nitrite NEGATIVE NEGATIVE   Leukocytes,Ua LARGE (A) NEGATIVE   RBC / HPF >50 (H) 0 - 5 RBC/hpf   WBC, UA >50 (H) 0 - 5 WBC/hpf   Bacteria, UA MANY (A) NONE SEEN   Squamous Epithelial / LPF NONE SEEN 0 - 5   WBC Clumps PRESENT     Comment: Performed at [facility], [address] [place], [place] [address]  Lactic acid, plasma     Status: None   Collection Time: 08/06/19 11:44 PM  Result Value Ref Range   Lactic Acid, Venous 1.2 0.5 - 1.9 mmol/L    Comment: Performed at [facility], [address], [place], [place] [address]   Dg Chest 2 View  Result Date: 08/06/2019 CLINICAL DATA:  Chest pain. Weakness for 1 months, worse in today. Shortness of breath. Patient from dialysis. EXAM: CHEST - 2 VIEW COMPARISON:   Chest radiograph 06/25/2019 FINDINGS: Right-sided dialysis catheter tip  in the lower SVC. Left-sided pacemaker in place. Post median sternotomy. Unchanged cardiomegaly. Small left pleural effusion and adjacent atelectasis. Minimal fluid in the fissures. Possible small right pleural effusion. No pulmonary edema. No pneumothorax. Degenerative change in the spine. No acute osseous abnormalities. IMPRESSION: 1. Small left pleural effusion with adjacent atelectasis. 2. Possible small right pleural effusion. 3. Stable cardiomegaly.  No pulmonary edema. Electronically Signed   By: Keith Rake M.D.   On: 08/06/2019 17:37   Ct Chest Wo Contrast  Result Date: 08/06/2019 CLINICAL DATA:  Shortness of breath. Rales. Patient reports weakness for 4 months, worse today. EXAM: CT CHEST WITHOUT CONTRAST TECHNIQUE: Multidetector CT imaging of the chest was performed following the standard protocol without IV contrast. COMPARISON:  Radiograph earlier this day. Chest CT 03/07/2018 FINDINGS: Cardiovascular: Right-sided dialysis catheter tip at the atrial caval junction. Left-sided pacemaker tips in the right atrium and ventricle. Multi chamber cardiomegaly. Coronary artery calcifications. Aortic atherosclerosis without aneurysm. No pericardial effusion. Post CABG. Mediastinum/Nodes: Small mediastinal lymph nodes, enlarged by size criteria. Limited assessment for hilar adenopathy given lack of IV contrast. Prominent bilateral axillary nodes are detained normal lymph node morphology. Slightly patulous esophagus without wall thickening. No suspicious thyroid nodule. Lungs/Pleura: Small bilateral pleural effusions, left greater than right. Mild heterogeneous airway 10 UA shin throughout both lungs. Linear atelectasis in the right middle lobe and anterior right upper lobe. Compressive atelectasis in the lung bases adjacent to pleural fluid. No pulmonary edema. Trachea and mainstem bronchi are patent. No pulmonary mass. Upper  Abdomen: Stones in the gallbladder, no gallbladder inflammation or visualized. Advanced atherosclerosis of upper abdominal vasculature. No acute findings. Musculoskeletal: Prior median sternotomy. There are no acute or suspicious osseous abnormalities. IMPRESSION: 1. Small bilateral pleural effusions, left greater than right. Compressive atelectasis in the lung bases adjacent to pleural fluid. 2. Slight heterogeneous parenchymal attenuation can be seen with small airways disease. 3. Multi chamber cardiomegaly. Coronary artery calcifications. No pulmonary edema. 4. Incidental cholelithiasis. Aortic Atherosclerosis (ICD10-I70.0). Electronically Signed   By: Keith Rake M.D.   On: 08/06/2019 21:26    Pending Labs Unresulted Labs (From admission, onward)    Start     Ordered   08/07/19 0500  Comprehensive metabolic panel  Tomorrow morning,   STAT     08/07/19 0223   08/07/19 0500  CBC  Tomorrow morning,   STAT     08/07/19 0223   08/07/19 0014  SARS CORONAVIRUS 2 (TAT 6-24 HRS) Nasopharyngeal Nasopharyngeal Swab  (Asymptomatic/Tier 3)  Once,   STAT    Question Answer Comment  Is this test for diagnosis or screening Screening   Symptomatic for COVID-19 as defined by CDC No   Hospitalized for COVID-19 No   Admitted to ICU for COVID-19 No   Previously tested for COVID-19 Yes   Resident in a congregate (group) care setting No   Employed in healthcare setting No   Pregnant No      08/07/19 0014   08/06/19 2241  Urine Culture  Once,   R     08/06/19 2241          Vitals/Pain Today's Vitals   08/06/19 1633 08/06/19 1636 08/07/19 0048 08/07/19 0110  BP: (!) 109/54  114/67   Pulse: 61  (!) 45   Resp: 18  18   Temp: 98.1 F (36.7 C)     TempSrc: Oral     SpO2: 94%  93%   Weight:  95.3 kg    Height:  5\' 2"  (1.575 m)    PainSc:    0-No pain    Isolation Precautions No active isolations  Medications Medications  sodium chloride flush (NS) 0.9 % injection 3 mL (has no  administration in time range)  sodium chloride flush (NS) 0.9 % injection 3 mL (has no administration in time range)  sodium chloride flush (NS) 0.9 % injection 3 mL (has no administration in time range)  0.9 %  sodium chloride infusion (has no administration in time range)  acetaminophen (TYLENOL) tablet 650 mg (has no administration in time range)    Or  acetaminophen (TYLENOL) suppository 650 mg (has no administration in time range)  insulin aspart (novoLOG) injection 0-5 Units (has no administration in time range)  insulin aspart (novoLOG) injection 0-6 Units (has no administration in time range)  acetaminophen (TYLENOL) tablet 650 mg (650 mg Oral Given 08/06/19 2340)  ceFEPIme (MAXIPIME) 2 g in sodium chloride 0.9 % 100 mL IVPB (0 g Intravenous Stopped 08/07/19 0016)  Melatonin TABS 5 mg (5 mg Oral Given 08/07/19 0011)    Mobility non-ambulatory High fall risk   Focused Assessments Renal Assessment Handoff:  Hemodialysis Schedule: Hemodialysis Schedule: Tuesday/Thursday/Saturday Last Hemodialysis date and time: 08/06/2019   Restricted appendage: right Chest     R Recommendations: See Admitting Provider Note  Report given to:   Additional Notes:  bilateral leg amputee has prosthetics

## 2019-08-07 NOTE — ED Notes (Signed)
Admitting physician in with patient.

## 2019-08-08 DIAGNOSIS — N390 Urinary tract infection, site not specified: Secondary | ICD-10-CM | POA: Diagnosis not present

## 2019-08-08 LAB — MAGNESIUM: Magnesium: 2.3 mg/dL (ref 1.7–2.4)

## 2019-08-08 LAB — URINE CULTURE

## 2019-08-08 LAB — BASIC METABOLIC PANEL
Anion gap: 11 (ref 5–15)
BUN: 20 mg/dL (ref 8–23)
CO2: 25 mmol/L (ref 22–32)
Calcium: 8.6 mg/dL — ABNORMAL LOW (ref 8.9–10.3)
Chloride: 99 mmol/L (ref 98–111)
Creatinine, Ser: 4.74 mg/dL — ABNORMAL HIGH (ref 0.44–1.00)
GFR calc Af Amer: 10 mL/min — ABNORMAL LOW (ref >60)
GFR calc non Af Amer: 9 mL/min — ABNORMAL LOW (ref >60)
Glucose, Bld: 133 mg/dL — ABNORMAL HIGH (ref 70–99)
Potassium: 4.3 mmol/L (ref 3.5–5.1)
Sodium: 135 mmol/L (ref 135–145)

## 2019-08-08 LAB — GLUCOSE, CAPILLARY
Glucose-Capillary: 125 mg/dL — ABNORMAL HIGH (ref 70–99)
Glucose-Capillary: 126 mg/dL — ABNORMAL HIGH (ref 70–99)
Glucose-Capillary: 120 mg/dL — ABNORMAL HIGH (ref 70–99)
Glucose-Capillary: 129 mg/dL — ABNORMAL HIGH (ref 70–99)

## 2019-08-08 MED ORDER — HEPARIN 1000 UNIT/ML FOR PERITONEAL DIALYSIS
500.0000 [IU] | INTRAMUSCULAR | Status: DC | PRN
  Filled 2019-08-08: qty 0.5

## 2019-08-08 MED ORDER — GENTAMICIN SULFATE 0.1 % EX CREA
1.0000 "application " | TOPICAL_CREAM | Freq: Every day | CUTANEOUS | Status: DC
  Administered 2019-08-08 – 2019-08-14 (×6): 1 via TOPICAL
  Filled 2019-08-08: qty 15

## 2019-08-08 NOTE — Evaluation (Signed)
Occupational Therapy Evaluation Patient Details Name: Caroline Hernandez MRN: [id number] DOB: Apr 13, 1946 Today's Date: 08/08/2019    History of Present Illness Caroline Hernandez  is a 73 y.o. female,  w hypertension, hyperlipidemia, Dm2, ESRD on PD, PAD s/p bilateral BKA, CAD, CHF (EF 45%), Chronic Afib, CLL presents with c/o weakness.  Pt recent admitted on 06/25/19 for diarrhea (negative C. Diff), pt denies dysuria, n/v, fever, chills, diarrhea, brbpr, black stool. She recently was at [facility] for short term rehab and was discharged and returned to the hospital in the same day with weakness and inability to get out of the car after dialysis.   Clinical Impression   Patient seen for OT evaluation this date.  Patient lives with her daughter in a one story home with a ramped entrance.  Patient was previously hospitalized in 06/2019 and then transferred to [facility] but was discharged home 2 days ago.  She left the facility, went to dialysis and then was unable to get out of the car to get into her home.  Her family brought her in to the ER for evaluation.  Patient is a bilateral amputee and reports she has not been able to stand for more than a month.  She was engaging in sliding board transfers at the facility but was not able to perform the transfer upon discharge.  Patient presents with history of visual impairments (macular degeneration) , muscle weakness, decreased ability to perform transfers, mobility, decreased ability to perform self care or IADL tasks.  She would likely benefit from continued STR however she is unsure if she has any Medicare days remaining.  She will require 24 hour care and will need follow up OT services in what ever venue she discharges to.  If she improves with the ability to consistently perform sliding board transfers then recommend a drop arm bedside commode for home.     Follow Up Recommendations  SNF if she has any remaining Medicare days left. If she return home, then rec [place] Recommendations  Other (comment)(may require a drop arm commode if she can consistently perform sliding board transfer.)    Recommendations for Other Services       Precautions / Restrictions Precautions Precautions: Fall Precaution Comments: B amputee, has prostetics in the room but reports she has not been able to stand for more than a month.      Mobility Bed Mobility Overal bed mobility: Needs Assistance Bed Mobility: Supine to Sit;Sit to Supine     Supine to sit: Min guard;HOB elevated Sit to supine: Min assist   General bed mobility comments: Max assist to reposition in bed once she returned from sitting to supine.  Increased time and effort to complete transfers.  Transfers                      Balance                                           ADL either performed or assessed with clinical judgement   ADL Overall ADL's : Needs assistance/impaired Eating/Feeding: Set up Eating/Feeding Details (indicate cue type and reason): Patient has issues with vision and required assistance to call to order to meal Grooming: Wash/dry hands;Oral care;Set up Grooming Details (indicate cue type and reason): Grooming and oral care\performed seated at the edge of the bed with setup Upper  Body Bathing: Set up;Min guard   Lower Body Bathing: Moderate assistance   Upper Body Dressing : Set up;Minimal assistance   Lower Body Dressing: Moderate assistance Lower Body Dressing Details (indicate cue type and reason): patient has difficulty with bed mobility to roll to pull up clothing (simulated this date since no clothing available.)   Toilet Transfer Details (indicate cue type and reason): Patient has been using bedpan for BM, reports she used to use BSC at home, no longer able to transfer to [facility], will likely need a drop arm commode at home if she progresses to be able to perform sliding board transfer to commode. Toileting- Clothing Manipulation and  Hygiene: Maximal assistance     Tub/Shower Transfer Details (indicate cue type and reason): patient performed sponge baths at home prior to admission   General ADL Comments: Patient currently unable to stand.  PT to eval for possible sliding board transfers or potential recommendation for hoyer if not able .     Vision Baseline Vision/History: Macular Degeneration Patient Visual Report: No change from baseline Additional Comments: Patient able to see outline of therapist but no specific features or details, assist with phone to call in meal for lunch        Perception     Praxis      Pertinent Vitals/Pain Pain Assessment: No/denies pain     Hand Dominance Right   Extremity/Trunk Assessment Upper Extremity Assessment Upper Extremity Assessment: Generalized weakness   Lower Extremity Assessment Lower Extremity Assessment: Defer to PT evaluation       Communication Communication Communication: No difficulties   Cognition Arousal/Alertness: Awake/alert Behavior During Therapy: WFL for tasks assessed/performed Overall Cognitive Status: History of cognitive impairments - at baseline                                 General Comments: Patient alert, oriented and able to provide information to therapist however does have some short term memories deficits and has difficulty recalling specific details of routines and the therapy she received while at STR. Daughter not present but should be in at some point this date, she lives with her daughter who is her primary caregiver and POA.   General Comments  Sitting balance fair to good for participation in oral care and grooming tasks. Tx:  Grooming and oral care seated EOB with setup and min guard, assist to access phone to order meal for lunch.   Exercises     Shoulder Instructions      Home Living Family/patient expects to be discharged to:: Private residence   Available Help at Discharge: Family Type of Home:  House Home Access: Ramped entrance     [place]: One level     Bathroom Shower/Tub: Tub/shower unit         Home Equipment: Environmental consultant - 2 wheels;Bedside commode;Shower seat;Electric scooter;Wheelchair - manual;Hospital bed          Prior Functioning/Environment Level of Independence: Needs assistance        Comments: Patient reports since her last hospitalization in Oct 2020 she has not stood or walked.  She has been working on Veterinary surgeon transfers.  Does have a scooter at home.        OT Problem List: Decreased strength;Impaired vision/perception;Decreased knowledge of use of DME or AE;Decreased range of motion;Decreased activity tolerance;Decreased cognition;Impaired balance (sitting and/or standing)      OT Treatment/Interventions: Self-care/ADL training;Visual/perceptual remediation/compensation;Therapeutic exercise;Patient/family education;Therapeutic  activities;Energy conservation;DME and/or AE instruction;Cognitive remediation/compensation    OT Goals(Current goals can be found in the care plan section) Acute Rehab OT Goals Patient Stated Goal: Patient would like to return home with her daughter. OT Goal Formulation: With patient Time For Goal Achievement: 08/23/19 Potential to Achieve Goals: Fair ADL Goals Pt Will Perform Upper Body Dressing: (P) with set-up Pt Will Perform Lower Body Dressing: (P) with min assist Pt Will Transfer to Toilet: (P) with mod assist;bedside commode  OT Frequency: Min 1X/week   Barriers to D/C:            Co-evaluation              AM-PAC OT "6 Clicks" Daily Activity     Outcome Measure Help from another person eating meals?: None Help from another person taking care of personal grooming?: A Little Help from another person toileting, which includes using toliet, bedpan, or urinal?: Total Help from another person bathing (including washing, rinsing, drying)?: A Lot Help from another person to put on and taking off  regular upper body clothing?: A Little Help from another person to put on and taking off regular lower body clothing?: A Lot 6 Click Score: 15   End of Session Equipment Utilized During Treatment: Oxygen  Activity Tolerance: Patient tolerated treatment well Patient left: in bed;with call bell/phone within reach;with bed alarm set  OT Visit Diagnosis: Muscle weakness (generalized) (M62.81);Low vision, both eyes (H54.2)                Time: 1010-1055 OT Time Calculation (min): 45 min Charges:  OT General Charges $OT Visit: 1 Visit OT Evaluation $OT Eval Moderate Complexity: 1 Mod OT Treatments $Self Care/Home Management : 8-22 mins  Amy T Lovett, OTR/L, CLT   Lovett,Amy 08/08/2019, 11:33 AM

## 2019-08-08 NOTE — NC FL2 (Addendum)
[place] LEVEL OF CARE SCREENING TOOL     IDENTIFICATION  Patient Name: Caroline Hernandez Birthdate: November 26, 1945 Sex: female Admission Date (Current Location): 08/06/2019  [place] and [place] Number:  Engineering geologist and Addrpaess:  [facility], [address] [place], [place] [address]      Provider Number: [id number]  Attending Physician Name and Address:  Fritzi Mandes, MD  Relative Name and Phone Number:  Wall,Jennifer Daughter [phone]  [phone]    Current Level of Care: Hospital Recommended Level of Care: [place] Prior Approval Number:    Date Approved/Denied:   PASRR Number: [id number]  Discharge Plan: SNF    Current Diagnoses: Patient Active Problem List   Diagnosis Date Noted  . Acute lower UTI 08/07/2019  . Dilated cardiomyopathy ([place]) 06/27/2019  . Unstable angina ([place]) 06/26/2019  . NSTEMI (non-ST elevated myocardial infarction) ([place]) 11/18/2018  . CLL (chronic lymphocytic leukemia) ([place]) 10/23/2018  . Pressure injury of skin 10/19/2018  . CHF (congestive heart failure) ([place]) 10/16/2018  . CAP (community acquired pneumonia) 04/14/2018  . UTI (urinary tract infection) 03/07/2018  . Atrial fibrillation with slow ventricular response ([place]) 07/18/2016  . Chronic diastolic CHF (congestive heart failure) ([place])   . SOB (shortness of breath)   . Acute on chronic systolic heart failure ([place]) 08/20/2015  . CKD (chronic kidney disease), stage IV ([place])   . Ischemic cardiomyopathy   . Chronic combined systolic and diastolic CHF (congestive heart failure) ([place])   . Essential hypertension 06/18/2015  . Systolic and diastolic CHF, acute on chronic ([place])   . Chronic renal failure syndrome, stage 5 (HCC)   . Angina pectoris ([place])   . Coronary artery disease involving native coronary artery of native heart with angina pectoris with documented spasm ([place])   . 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 Persistent atrial fibrillation ([place])   . Bradycardia   . Chest pain 06/12/2015    Orientation RESPIRATION BLADDER Height & Weight     Self, Time, Situation  Normal Incontinent Weight: 232 lb 9.6 oz (105.5 kg) Height:  5\' 2"  (157.5 cm)  BEHAVIORAL SYMPTOMS/MOOD NEUROLOGICAL BOWEL NUTRITION STATUS      Continent Diet(Heart Healthy diet)  AMBULATORY STATUS COMMUNICATION OF NEEDS Skin   Limited Assist Verbally Normal                       Personal Care Assistance Level of Assistance  Bathing, Dressing, Feeding Bathing Assistance: Limited assistance Feeding assistance: Independent Dressing Assistance: Limited assistance     Functional Limitations Info  Sight, Speech, Hearing Sight Info: Adequate Hearing Info: Adequate Speech Info: Adequate    SPECIAL CARE FACTORS FREQUENCY  PT (By licensed PT), OT (By licensed OT)     PT Frequency: Minimum 5x a week OT Frequency: Minimum 5x a week            Contractures Contractures Info: Present    Additional Factors Info  Allergies, Code Status, Insulin Sliding Scale, Psychotropic Code Status Info: Full Code Allergies Info: Piperacillin Piperacillin-tazobactam In Dex Zosyn Piperacillin Sod-tazobactam So Psychotropic Info: sertraline (ZOLOFT) tablet 100 mg Insulin Sliding Scale Info: insulin aspart (novoLOG) injection 0-6 Units 3x a day with meals       Current Medications (08/08/2019):  This is the current hospital active medication list Current Facility-Administered Medications  Medication Dose Route Frequency Provider Last Rate Last Dose  . 0.9 %  sodium chloride infusion  250 mL Intravenous PRN Jani Gravel, MD   Stopped at 08/08/19 [phone]  . acetaminophen (TYLENOL) tablet 650 mg  650 mg Oral Q6H PRN Jani Gravel, MD   650 mg at 08/07/19 1009   Or  . acetaminophen (TYLENOL) suppository 650 mg  650 mg Rectal Q6H PRN Jani Gravel, MD      . amitriptyline (ELAVIL) tablet 25 mg  25 mg Oral Loma Sousa, MD   25 mg at 08/07/19 2127  . apixaban (ELIQUIS) tablet 5 mg  5 mg Oral BID Dallie Piles, RPH   5 mg at 08/08/19 3785  . atorvastatin (LIPITOR) tablet 40 mg  40 mg Oral q1800 Jani Gravel, MD   40 mg at 08/07/19 1704  . Chlorhexidine Gluconate Cloth 2 % PADS 6 each  6 each Topical Q0600 Jani Gravel, MD   6 each at 08/08/19 [phone]  . clopidogrel (PLAVIX) tablet 75 mg  75 mg Oral Daily Jani Gravel, MD   75 mg at 08/08/19 0847  . gabapentin (NEURONTIN) capsule 100 mg  100 mg Oral BID Jani Gravel, MD   100 mg at 08/08/19 0846  . gentamicin cream (GARAMYCIN) 0.1 % 1 application  1 application Topical Daily Lateef, Munsoor, MD      . heparin 1000 unit/ml injection 500 Units  500 Units Intraperitoneal PRN Lateef, Munsoor, MD      . insulin aspart (novoLOG) injection 0-5 Units  0-5 Units Subcutaneous QHS Jani Gravel, MD      . insulin aspart (novoLOG) injection 0-6 Units  0-6 Units Subcutaneous TID WC Jani Gravel, MD      . insulin glargine (LANTUS) injection 12 Units  12 Units Subcutaneous Daily Jani Gravel, MD   12 Units at 08/08/19 0845  . iron polysaccharides (NIFEREX) capsule 150 mg  150 mg Oral Daily Jani Gravel, MD   150 mg at 08/08/19 0846  . isosorbide mononitrate (IMDUR) 24 hr tablet 30 mg  30 mg Oral Daily Jani Gravel, MD   30 mg at 08/08/19 0846  . Melatonin TABS 5 mg  5 mg Oral QHS Fritzi Mandes, MD   5 mg at 08/07/19 2127  . mupirocin ointment (BACTROBAN) 2 % 1 application  1 application Nasal BID Jani Gravel, MD   1 application at 27/74/12 0847  . pantoprazole (PROTONIX) EC tablet 40 mg  40 mg Oral Loma Sousa, MD   40 mg at 08/07/19 2127  . sertraline (ZOLOFT) tablet 100 mg  100 mg Oral Loma Sousa, MD   100 mg at 08/07/19 2127  . sevelamer carbonate (RENVELA) tablet 2,400 mg  2,400 mg Oral TID WC Jani Gravel, MD   2,400 mg at 08/08/19 1231  . sodium chloride flush (NS) 0.9 % injection 3 mL  3 mL Intravenous Once Duffy Bruce, MD      . sodium chloride flush (NS) 0.9 % injection 3 mL   3 mL Intravenous Q12H Jani Gravel, MD   3 mL at 08/08/19 0848  . sodium chloride flush (NS) 0.9 % injection 3 mL  3 mL Intravenous PRN Jani Gravel, MD      . torsemide [facility]) tablet 20 mg  20 mg Oral BID Fritzi Mandes, MD   Stopped at 08/08/19 [phone]     Discharge Medications: Please see discharge summary for a list of discharge medications.  Relevant Imaging Results:  Relevant Lab Results:   Additional Information SSN [id number]  Patient is a dialysis patient.  Ross Ludwig, LCSW

## 2019-08-08 NOTE — Progress Notes (Signed)
   08/08/19 2300  Neurological  Level of Consciousness Alert  Orientation Level Oriented X4  Respiratory  Respiratory Pattern Regular;Unlabored  Bilateral Breath Sounds Clear;Diminished  Cardiac  Pulse Regular  pt stable for PD tx no c/os no distress noted

## 2019-08-08 NOTE — Progress Notes (Signed)
[facility] [place] NAME: Caroline Hernandez    MR#:  [id number]  [place]:  November 04, 1945  SUBJECTIVE:  patient was discharged from rehab 2 days ago straight went to dialysis thereafter went home was very weak fell sick and brought back to the emergency room as she could not get out of the car even with help.  Patient hemodynamically stable. Blood pressure bit on lower side however patient asymptomatic. No fever.  good appetite.  REVIEW OF SYSTEMS:   Review of Systems  Constitutional: Negative for chills, fever and weight loss.  HENT: Negative for ear discharge, ear pain and nosebleeds.   Eyes: Negative for blurred vision, pain and discharge.  Respiratory: Negative for sputum production, shortness of breath, wheezing and stridor.   Cardiovascular: Negative for chest pain, palpitations, orthopnea and PND.  Gastrointestinal: Negative for abdominal pain, diarrhea, nausea and vomiting.  Genitourinary: Negative for frequency and urgency.  Musculoskeletal: Negative for back pain and joint pain.  Neurological: Positive for weakness. Negative for sensory change, speech change and focal weakness.  Psychiatric/Behavioral: Negative for depression and hallucinations. The patient is not nervous/anxious.    Tolerating Diet: yes Tolerating PT: pending  DRUG ALLERGIES:   Allergies  Allergen Reactions  . Piperacillin Other (See Comments)    Renal failure  . 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 See ID note**PER PT-CAUSED RENAL FAILURE**  . Zosyn [Piperacillin Sod-Tazobactam So] Other (See Comments)    Renal failure    VITALS:  Blood pressure (!) 89/63, pulse (!) 51, temperature 97.9 F (36.6 C), resp. rate 18, height 5\' 2"  (1.575 m), weight 105.5 kg, SpO2 97 %.  PHYSICAL EXAMINATION:   Physical Exam  GENERAL:  73 y.o.-year-old patient lying in the bed with no acute distress. Chronically ill EYES:  Pupils equal, round, reactive to light and accommodation. No scleral icterus. Extraocular muscles intact.  HEENT: Head atraumatic, normocephalic. Oropharynx and nasopharynx clear.  NECK:  Supple, no jugular venous distention. No thyroid enlargement, no tenderness.  LUNGS: Normal breath sounds bilaterally, no wheezing, rales, rhonchi. No use of accessory muscles of respiration.  CARDIOVASCULAR: S1, S2 normal. No murmurs, rubs, or gallops.  ABDOMEN: Soft, nontender, nondistended. Bowel sounds present. No organomegaly or mass. PD cath + HD Cath + EXTREMITIES: bilateral below knee amputation stump looks okay NEUROLOGIC: grossly nonfocal. PSYCHIATRIC:  patient is alert and oriented x 3.  SKIN: No obvious rash, lesion, or ulcer.   LABORATORY PANEL:  CBC Recent Labs  Lab 08/07/19 0446  WBC 23.8*  HGB 9.4*  HCT 28.8*  PLT 165    Chemistries  Recent Labs  Lab 08/07/19 0446  08/08/19 0614  NA 135  --  135  K 3.1*  --  4.3  CL 99  --  99  CO2 25  --  25  GLUCOSE 146*  --  133*  BUN 12  --  20  CREATININE 3.62*  --  4.74*  CALCIUM 8.2*  --  8.6*  MG  --    < > 2.3  AST 18  --   --   ALT 8  --   --   ALKPHOS 72  --   --   BILITOT 0.9  --   --    < > = values in this interval not displayed.   Cardiac Enzymes No results for input(s): TROPONINI in the last 168 hours. RADIOLOGY:  Dg Chest 2 View  Result Date: 08/06/2019 CLINICAL DATA:  Chest pain. Weakness for 1 months, worse in today. Shortness of breath. Patient from dialysis. EXAM: CHEST - 2 VIEW COMPARISON:  Chest radiograph 06/25/2019 FINDINGS: Right-sided dialysis catheter tip in the lower SVC. Left-sided pacemaker in place. Post median sternotomy. Unchanged cardiomegaly. Small left pleural effusion and adjacent atelectasis. Minimal fluid in the fissures. Possible small right pleural effusion. No pulmonary edema. No pneumothorax. Degenerative change in the spine. No acute osseous abnormalities. IMPRESSION: 1. Small left pleural  effusion with adjacent atelectasis. 2. Possible small right pleural effusion. 3. Stable cardiomegaly.  No pulmonary edema. Electronically Signed   By: Keith Rake M.D.   On: 08/06/2019 17:37   Ct Chest Wo Contrast  Result Date: 08/06/2019 CLINICAL DATA:  Shortness of breath. Rales. Patient reports weakness for 4 months, worse today. EXAM: CT CHEST WITHOUT CONTRAST TECHNIQUE: Multidetector CT imaging of the chest was performed following the standard protocol without IV contrast. COMPARISON:  Radiograph earlier this day. Chest CT 03/07/2018 FINDINGS: Cardiovascular: Right-sided dialysis catheter tip at the atrial caval junction. Left-sided pacemaker tips in the right atrium and ventricle. Multi chamber cardiomegaly. Coronary artery calcifications. Aortic atherosclerosis without aneurysm. No pericardial effusion. Post CABG. Mediastinum/Nodes: Small mediastinal lymph nodes, enlarged by size criteria. Limited assessment for hilar adenopathy given lack of IV contrast. Prominent bilateral axillary nodes are detained normal lymph node morphology. Slightly patulous esophagus without wall thickening. No suspicious thyroid nodule. Lungs/Pleura: Small bilateral pleural effusions, left greater than right. Mild heterogeneous airway 10 UA shin throughout both lungs. Linear atelectasis in the right middle lobe and anterior right upper lobe. Compressive atelectasis in the lung bases adjacent to pleural fluid. No pulmonary edema. Trachea and mainstem bronchi are patent. No pulmonary mass. Upper Abdomen: Stones in the gallbladder, no gallbladder inflammation or visualized. Advanced atherosclerosis of upper abdominal vasculature. No acute findings. Musculoskeletal: Prior median sternotomy. There are no acute or suspicious osseous abnormalities. IMPRESSION: 1. Small bilateral pleural effusions, left greater than right. Compressive atelectasis in the lung bases adjacent to pleural fluid. 2. Slight heterogeneous parenchymal  attenuation can be seen with small airways disease. 3. Multi chamber cardiomegaly. Coronary artery calcifications. No pulmonary edema. 4. Incidental cholelithiasis. Aortic Atherosclerosis (ICD10-I70.0). Electronically Signed   By: Keith Rake M.D.   On: 08/06/2019 21:26   ASSESSMENT AND PLAN:  Caroline Hernandez is a 73 y.o. female  With extensive PMHx including CAD, CHF, ESRD, CHF, recurrent UTIs here with weakness. Pt went to HD today as scheduled. She states she has felt somewhat fatigued x days 2/2 a UTI, for which she is on Cipro. She went to HD today and felt weak after, but reportedly was near time for d/c for her SNF and was discharged from [facility] facility to go  home for the holidays. Pt reports that after HD, she felt incredibly weak and fatigued.  #Generalized weakness, chronic fatiguability with relative hypotension -hold Norvasc -decreased dose of torsemide 20 BID -patient feels better today. -At baseline she transfers from bed to chair. She has bilateral lower extremity processes. At home she has electric wheelchair also per daughter -patient just got out of [facility] yesterday. Per daughter to for people to get her out of the chair but patient could not enter the house and was brought to the emergency room -physical therapy, occupational therapy to see today -CSW for D/c planning  #Abnormal UA -given history of end-stage renal disease on dialysis and very little urine output I feel this is colonization -has not had  fever, denies any dysuria symptoms -received IV Rocephin x2 and was on Cipro before coming to the hospital.--pt asymptomatic--will d/c abxs -urine culture pending -patient has had antibiotics couple rounds lately -this was discussed with patient's daughter [name] wall-- she voiced understanding  #Chronic atrial fibrillation -not on beta-blockers. Continue eliquis  #Chronic systolic/diastolic heart failure secondary to ischemic cardiomyopathy EF of  25% -recent my of you in October 2020 showed large scar and reduced EF but no ischemia -patient not in heart failure  #End-stage renal disease on hemodialysis -Dr. Zollie Scale to see patient for dialysis  #Type II diabetes with diabetic nephropathy/end-stage renal disease/diabetic neuropathy -insulin Lantus and sliding scale  #Peripheral arterial disease status post bilateral below knee amputation -patient has prosthesis and electric wheelchair at home  #Social worker for discharge planning  Family communication : spoke with [name] wall daughter on the phone Consults : nephrology Discharge Disposition : to be determined CODE STATUS: full DVT Prophylaxis : heparin  TOTAL TIME TAKING CARE OF THIS PATIENT: *30** minutes.  >50% time spent on counselling and coordination of care  POSSIBLE D/C IN *1-2** DAYS, DEPENDING ON CLINICAL CONDITION.  Note: This dictation was prepared with Dragon dictation along with smaller phrase technology. Any transcriptional errors that result from this process are unintentional.  Fritzi Mandes M.D on 08/08/2019 at 12:37 PM  Between 7am to 6pm - Pager - [phone]  After 6pm go to [email or web address]  [facility]   CC: Primary care physician; System, Pcp Not InPatient ID: Caroline Hernandez, female   DOB: Mar 29, 1946, 73 y.o.   MRN: [id number]

## 2019-08-08 NOTE — Progress Notes (Addendum)
Dialysis Nurse called and states will be here to do PD dialysis tonight. Will notify pt. Will continue to monitor.  Update 2306: Dialysis nurse came and start PD for pt. Will continue to monitor.

## 2019-08-08 NOTE — Progress Notes (Signed)
[facility]  ROUNDING NOTE   Subjective:  Patient well-known to [place] from outpatient peritoneal dialysis as well as recent hemodialysis. We also saw her during her last hospitalization. After her last hospitalization patient did go to a rehabilitation facility. Unfortunately patient continues to have significant difficulties with ambulation. Still unable to transfer by herself.   Objective:  Vital signs in last 24 hours:  Temp:  [97.8 F (36.6 C)-98.7 F (37.1 C)] 97.9 F (36.6 C) (11/27 0746) Pulse Rate:  [51-60] 51 (11/27 0746) Resp:  [18-20] 18 (11/27 0746) BP: (89-124)/(50-63) 89/63 (11/27 0746) SpO2:  [95 %-98 %] 97 % (11/27 0746) Weight:  [105.5 kg] 105.5 kg (11/27 0521)  Weight change: 10.3 kg Filed Weights   08/06/19 1636 08/07/19 0331 08/08/19 0521  Weight: 95.3 kg 103.4 kg 105.5 kg    Intake/Output: I/O last 3 completed shifts: In: 480.5 [P.O.:240; I.V.:0.1; IV Piggyback:240.4] Out: 50 [Urine:50]   Intake/Output this shift:  No intake/output data recorded.  Physical Exam: General: No acute distress  Head: Normocephalic, atraumatic. Moist oral mucosal membranes  Eyes: Anicteric  Neck: Supple, trachea midline  Lungs:  Clear to auscultation, normal effort  Heart: S1S2 no rubs  Abdomen:  Soft, nontender, bowel sounds present  Extremities: B/L LE amputations, trace edema in limbs  Neurologic: Awake, alert, following commands  Skin: No lesions  Access: PD catheter and IJ PC in place    Basic Metabolic Panel: Recent Labs  Lab 08/06/19 1629 08/07/19 0446 08/07/19 1013 08/08/19 0614  NA 134* 135  --  135  K 2.8* 3.1*  --  4.3  CL 97* 99  --  99  CO2 25 25  --  25  GLUCOSE 207* 146*  --  133*  BUN 9 12  --  20  CREATININE 2.96* 3.62*  --  4.74*  CALCIUM 8.0* 8.2*  --  8.6*  MG  --   --  1.8 2.3    Liver Function Tests: Recent Labs  Lab 08/07/19 0446  AST 18  ALT 8  ALKPHOS 72  BILITOT 0.9  PROT 5.3*  ALBUMIN 2.9*   No results  for input(s): LIPASE, AMYLASE in the last 168 hours. No results for input(s): AMMONIA in the last 168 hours.  CBC: Recent Labs  Lab 08/06/19 1629 08/07/19 0446  WBC 25.5* 23.8*  HGB 10.4* 9.4*  HCT 34.4* 28.8*  MCV 91.5 85.5  PLT 182 165    Cardiac Enzymes: No results for input(s): CKTOTAL, CKMB, CKMBINDEX, TROPONINI in the last 168 hours.  BNP: Invalid input(s): POCBNP  CBG: Recent Labs  Lab 08/07/19 1139 08/07/19 1656 08/07/19 2139 08/08/19 0746 08/08/19 1140  GLUCAP 138* 140* 150* 125* 126*    Microbiology: Results for orders placed or performed during the hospital encounter of 08/06/19  SARS CORONAVIRUS 2 (TAT 6-24 HRS) Nasopharyngeal Nasopharyngeal Swab     Status: None   Collection Time: 08/07/19  1:13 AM   Specimen: Nasopharyngeal Swab  Result Value Ref Range Status   SARS Coronavirus 2 NEGATIVE NEGATIVE Final    Comment: (NOTE) SARS-CoV-2 target nucleic acids are NOT DETECTED. The SARS-CoV-2 RNA is generally detectable in upper and lower respiratory specimens during the acute phase of infection. Negative results do not preclude SARS-CoV-2 infection, do not rule out co-infections with other pathogens, and should not be used as the sole basis for treatment or other patient management decisions. Negative results must be combined with clinical observations, patient history, and epidemiological information. The expected result is  Negative. Fact Sheet for Patients: SugarRoll.be Fact Sheet for Healthcare Providers: https://www.woods-mathews.com/ This test is not yet approved or cleared by the [place] FDA and  has been authorized for detection and/or diagnosis of SARS-CoV-2 by FDA under an Emergency Use Authorization (EUA). This EUA will remain  in effect (meaning this test can be used) for the duration of the COVID-19 declaration under Section 56 4(b)(1) of the Act, 21 U.S.C. section 360bbb-3(b)(1), unless the  authorization is terminated or revoked sooner. Performed at [facility], [place] [address], [place], [place] [address]   MRSA PCR Screening     Status: Abnormal   Collection Time: 08/07/19  4:12 AM   Specimen: Nasal Mucosa; Nasopharyngeal  Result Value Ref Range Status   MRSA by PCR POSITIVE (A) NEGATIVE Final    Comment:        The GeneXpert MRSA Assay (FDA approved for NASAL specimens only), is one component of a comprehensive MRSA colonization surveillance program. It is not intended to diagnose MRSA infection nor to guide or monitor treatment for MRSA infections. RESULT CALLED TO, READ BACK BY AND VERIFIED WITH: Corine Shelter RN [id number] 08/07/2019 HNM Performed at [facility], [address] [place], [place] [address]     Coagulation Studies: No results for input(s): LABPROT, INR in the last 72 hours.  Urinalysis: Recent Labs    08/06/19 2241  COLORURINE YELLOW*  LABSPEC 1.006  PHURINE 5.0  GLUCOSEU NEGATIVE  HGBUR MODERATE*  BILIRUBINUR NEGATIVE  KETONESUR NEGATIVE  PROTEINUR 100*  NITRITE NEGATIVE  LEUKOCYTESUR LARGE*      Imaging: Dg Chest 2 View  Result Date: 08/06/2019 CLINICAL DATA:  Chest pain. Weakness for 1 months, worse in today. Shortness of breath. Patient from dialysis. EXAM: CHEST - 2 VIEW COMPARISON:  Chest radiograph 06/25/2019 FINDINGS: Right-sided dialysis catheter tip in the lower SVC. Left-sided pacemaker in place. Post median sternotomy. Unchanged cardiomegaly. Small left pleural effusion and adjacent atelectasis. Minimal fluid in the fissures. Possible small right pleural effusion. No pulmonary edema. No pneumothorax. Degenerative change in the spine. No acute osseous abnormalities. IMPRESSION: 1. Small left pleural effusion with adjacent atelectasis. 2. Possible small right pleural effusion. 3. Stable cardiomegaly.  No pulmonary edema. Electronically Signed   By: Keith Rake M.D.   On: 08/06/2019 17:37   Ct Chest Wo  Contrast  Result Date: 08/06/2019 CLINICAL DATA:  Shortness of breath. Rales. Patient reports weakness for 4 months, worse today. EXAM: CT CHEST WITHOUT CONTRAST TECHNIQUE: Multidetector CT imaging of the chest was performed following the standard protocol without IV contrast. COMPARISON:  Radiograph earlier this day. Chest CT 03/07/2018 FINDINGS: Cardiovascular: Right-sided dialysis catheter tip at the atrial caval junction. Left-sided pacemaker tips in the right atrium and ventricle. Multi chamber cardiomegaly. Coronary artery calcifications. Aortic atherosclerosis without aneurysm. No pericardial effusion. Post CABG. Mediastinum/Nodes: Small mediastinal lymph nodes, enlarged by size criteria. Limited assessment for hilar adenopathy given lack of IV contrast. Prominent bilateral axillary nodes are detained normal lymph node morphology. Slightly patulous esophagus without wall thickening. No suspicious thyroid nodule. Lungs/Pleura: Small bilateral pleural effusions, left greater than right. Mild heterogeneous airway 10 UA shin throughout both lungs. Linear atelectasis in the right middle lobe and anterior right upper lobe. Compressive atelectasis in the lung bases adjacent to pleural fluid. No pulmonary edema. Trachea and mainstem bronchi are patent. No pulmonary mass. Upper Abdomen: Stones in the gallbladder, no gallbladder inflammation or visualized. Advanced atherosclerosis of upper abdominal vasculature. No acute findings. Musculoskeletal: Prior median  sternotomy. There are no acute or suspicious osseous abnormalities. IMPRESSION: 1. Small bilateral pleural effusions, left greater than right. Compressive atelectasis in the lung bases adjacent to pleural fluid. 2. Slight heterogeneous parenchymal attenuation can be seen with small airways disease. 3. Multi chamber cardiomegaly. Coronary artery calcifications. No pulmonary edema. 4. Incidental cholelithiasis. Aortic Atherosclerosis (ICD10-I70.0).  Electronically Signed   By: Keith Rake M.D.   On: 08/06/2019 21:26     Medications:   . sodium chloride Stopped (08/08/19 0508)   . amitriptyline  25 mg Oral QHS  . apixaban  5 mg Oral BID  . atorvastatin  40 mg Oral q1800  . Chlorhexidine Gluconate Cloth  6 each Topical Q0600  . clopidogrel  75 mg Oral Daily  . gabapentin  100 mg Oral BID  . gentamicin cream  1 application Topical Daily  . insulin aspart  0-5 Units Subcutaneous QHS  . insulin aspart  0-6 Units Subcutaneous TID WC  . insulin glargine  12 Units Subcutaneous Daily  . iron polysaccharides  150 mg Oral Daily  . isosorbide mononitrate  30 mg Oral Daily  . Melatonin  5 mg Oral QHS  . mupirocin ointment  1 application Nasal BID  . pantoprazole  40 mg Oral QHS  . sertraline  100 mg Oral QHS  . sevelamer carbonate  2,400 mg Oral TID WC  . sodium chloride flush  3 mL Intravenous Once  . sodium chloride flush  3 mL Intravenous Q12H  . torsemide  20 mg Oral BID   sodium chloride, acetaminophen **OR** acetaminophen, heparin, sodium chloride flush  Assessment/ Plan:  73 y.o. female with ESRD on PD, bilateral BKA, coronary artery disease status post CABG, chronic systolic heart failure, CLL, diabetes mellitus type 2, hypertension, GERD, hyperlipidemia, ischemic cardiomyopathy, peripheral arterial disease.  [facility] for [facility] Receiving back up hemo at [facility].   1.  ESRD on backup hemodialysis but transitioning back to peritoneal dialysis.  For the past 3 weeks the patient has been at a rehabilitation facility.  However she was recently discharged to home.  Patient could not transfer however and therefore was brought back to the emergency department.  At this point time we will go ahead and reinitiate the patient on peritoneal dialysis.  Orders have been prepared.  2.  Anemia of chronic kidney disease.  Hemoglobin currently 9.4.  She will resume Epogen as an outpatient.  3.  Secondary hyperparathyroidism.   Maintain the patient on Renvela 3 tablets p.o. 3 times daily with meals and continue to periodically monitor bone mineral metabolism parameters.  4.  Generalized debility.  Patient unable to perform transfers at the moment.  However it appears that she is exhausted her Medicare days for further stay at a rehabilitation center.  Management as per social work, physical therapy, and hospitalist.   LOS: 1 Airen Dales 11/27/202012:55 PM

## 2019-08-08 NOTE — Evaluation (Signed)
Physical Therapy Evaluation Patient Details Name: Caroline Hernandez MRN: [id number] DOB: March 02, 1946 Today's Date: 08/08/2019   History of Present Illness  73 y.o. female, w/ hypertension, hyperlipidemia, Dm2, ESRD on PD, PAD s/p bilateral BKA, CAD, CHF (EF 45%), Chronic Afib, CLL presents with c/o weakness.  Pt recent admitted on 06/25/19 for diarrhea (negative C. Diff), pt denies dysuria, n/v, fever, chills, diarrhea, brbpr, black stool. She recently was at [facility] for short term rehab and was discharged and returned to the hospital in the same day with weakness and inability to get out of the car after dialysis.  Clinical Impression  Pt very pleasant and eager to work with PT however she was very limited with how much she could functionally do.  She has had b/l BKA for multiple years with b/l prosthetics but apparently has not walked in nearly a year.  Today, on inspection, L residual limb with erythema at tibial prominence and with scabbing on R.  (note: in looking at prosthetics even with supple padding it does not appear that weight distribution would be fully appropriate).  Pt reports that prior to admit last month she was able to transfer to her scooter w/o assist; she was able to use slide board to transfer to recliner with min/mod assist (slightly "down hill") but did struggle with the effort.  The plan was to transfer back to bed and then again to recliner so that she could sit up but pt was fatigued and even with max assist she was only able to initiate slide back to bed before we aborted the effort and decided to remain in recliner and utilize [place] when getting back to bed (nursing informed of plan).  Pt was very eager to advocate for herself at next bout of rehab and is keen to focus on functional transfer tasks to allow her to return, frankly from this PT's perspective a focus on standing/LE WBing is not appropriate from this pt at this time.  Pt is still clearly unsafe/unable to return home and will  need short term rehab with focus on UE strength and transfers to eventually allow more independent and safe return to home.      Follow Up Recommendations SNF;Supervision/Assistance - 24 hour    Equipment Recommendations  Other (comment)(discussed Harrel Lemon if pt doesn't make much improvement at rehab)    Recommendations for Other Services       Precautions / Restrictions Precautions Precautions: Fall Restrictions Weight Bearing Restrictions: No Other Position/Activity Restrictions: R residual limb with distal scab/scarring, L with erythema      Mobility  Bed Mobility Overal bed mobility: Needs Assistance Bed Mobility: Supine to Sit     Supine to sit: Min assist;Mod assist     General bed mobility comments: Pt showed good effort with getting to EOB, she was able to slighly elevate trunk w/o assist but quickly became apparent that she would need more significant assist   Transfers Overall transfer level: Needs assistance Equipment used: Sliding board Transfers: Lateral/Scoot Transfers          Lateral/Scoot Transfers: Min assist;Mod assist General transfer comment: mild "down hill" slope for bed to recliner transfer.  Pt struggled to make initial push to get fully onto slide board, once on (with sheet to reduce friction) she required only minimal assist to get onto recliner.  Pt able to do minimal chair push ups to scoot to center of recliner.  Ambulation/Gait             General Gait  Details: has not walked in ~1 year, not tested  Stairs            Wheelchair Mobility    Modified Rankin (Stroke Patients Only)       Balance Overall balance assessment: Needs assistance Sitting-balance support: Bilateral upper extremity supported Sitting balance-Leahy Scale: Fair Sitting balance - Comments: Pt able to sit at EOB relatively well with light reliance on b/l UEs.  She was able to lean away and maintain balance in order to get slide board under her hip while  maintaining upright.     Standing balance-Leahy Scale: (not tested, did not don prosthetics)                               Pertinent Vitals/Pain Pain Assessment: No/denies pain    Home Living Family/patient expects to be discharged to:: Skilled nursing facility Living Arrangements: Children Available Help at Discharge: Family Type of Home: House Home Access: Ramped entrance     Home Layout: One level Home Equipment: Environmental consultant - 2 wheels;Bedside commode;Shower seat;Electric scooter;Wheelchair - manual;Hospital bed      Prior Function Level of Independence: Needs assistance   Gait / Transfers Assistance Needed: Pt reports she has not ambulated in ~1 year, was doing transfers independently prior to previous admission 1+ month ago           Hand Dominance   Dominant Hand: Right    Extremity/Trunk Assessment   Upper Extremity Assessment Upper Extremity Assessment: Defer to OT evaluation;Generalized weakness(appears = bilaterally)    Lower Extremity Assessment Lower Extremity Assessment: Generalized weakness(near TKE b/l, has AROM in b/l hips and knees)       Communication   Communication: No difficulties  Cognition Arousal/Alertness: Awake/alert Behavior During Therapy: WFL for tasks assessed/performed Overall Cognitive Status: Within Functional Limits for tasks assessed                                        General Comments      Exercises     Assessment/Plan    PT Assessment Patient needs continued PT services  PT Problem List Decreased strength;Decreased range of motion;Decreased activity tolerance;Decreased balance;Decreased mobility;Decreased coordination;Decreased knowledge of use of DME;Decreased safety awareness       PT Treatment Interventions DME instruction;Functional mobility training;Therapeutic activities;Therapeutic exercise;Balance training;Neuromuscular re-education;Patient/family education    PT Goals (Current  goals can be found in the Care Plan section)  Acute Rehab PT Goals Patient Stated Goal: Patient would like to return home with her daughter. PT Goal Formulation: With patient Time For Goal Achievement: 08/22/19 Potential to Achieve Goals: Fair    Frequency Min 2X/week   Barriers to discharge        Co-evaluation               AM-PAC PT "6 Clicks" Mobility  Outcome Measure Help needed turning from your back to your side while in a flat bed without using bedrails?: None Help needed moving from lying on your back to sitting on the side of a flat bed without using bedrails?: A Little Help needed moving to and from a bed to a chair (including a wheelchair)?: A Lot Help needed standing up from a chair using your arms (e.g., wheelchair or bedside chair)?: Total Help needed to walk in hospital room?: Total Help needed climbing 3-5 steps with a railing? :  Total 6 Click Score: 12    End of Session Equipment Utilized During Treatment: Gait belt;Oxygen Activity Tolerance: Patient limited by fatigue Patient left: with chair alarm set;with call bell/phone within reach Nurse Communication: Mobility status;Need for lift equipment PT Visit Diagnosis: Muscle weakness (generalized) (M62.81)    Time: 3414-4360 PT Time Calculation (min) (ACUTE ONLY): 62 min   Charges:   PT Evaluation $PT Eval Moderate Complexity: 1 Mod PT Treatments $Therapeutic Activity: 23-37 mins        Kreg Shropshire, DPT 08/08/2019, 3:50 PM

## 2019-08-08 NOTE — Consult Note (Signed)
PHARMACY CONSULT NOTE  Pharmacy Consult for Electrolyte Monitoring and Replacement   Recent Labs: Potassium (mmol/L)  Date Value  08/08/2019 4.3   Magnesium (mg/dL)  Date Value  08/08/2019 2.3   Calcium (mg/dL)  Date Value  08/08/2019 8.6 (L)   Albumin (g/dL)  Date Value  08/07/2019 2.9 (L)  11/18/2015 4.1   Phosphorus (mg/dL)  Date Value  06/26/2019 8.8 (H)   Sodium (mmol/L)  Date Value  08/08/2019 135  10/07/2018 142   Corrected Ca: 9.1 mg/dL  Assessment: 73 y.o. female,  w hypertension, hyperlipidemia, DM2, ESRD on PD, PAD s/p bilateral BKA, CAD, CHF (EF 45%), Chronic Afib, CLL presents with c/o weakness related to hypotension. She was on torsemide PTA which has been continued at a reduced dose of 20 mg BID. Pt is on sevelamer, nephro to see patient for HD/PD plan.   Goal of Therapy:  Given cardiac history: Potassium 4.0 - 5.1 mmol/L Magnesium 2.0 - 2.4 mg/dL Other electrolytes WNL  Plan:   No replacement needed at this time.   Check BMP in am  Oswald Hillock ,PharmD, BCPS Clinical Pharmacist 08/08/2019 7:43 AM

## 2019-08-08 NOTE — TOC Initial Note (Signed)
Transition of Care [facility]) - Initial/Assessment Note    Patient Details  Name: Caroline Hernandez MRN: [id number] Date of Birth: 1946/08/11  Transition of Care [facility]) CM/SW Contact:    Ross Ludwig, LCSW Phone Number: 08/08/2019, 6:22 PM  Clinical Narrative:                  Patient recently discharged from hospital on 07/02/19 to [facility], then was discharged from 2 days ago due to insurance denying patient.  Patient is agreeable with going to SNF again, CSW sent clinicals to [facility] to see if patient gets approved again.  CSW to continue to find bed placement for patient.  CSW awaiting bed offers and insurance authorization.  Expected Discharge Plan: Skilled Nursing Facility Barriers to Discharge: Insurance Authorization, Continued Medical Work up   Patient Goals and CMS Choice Patient states their goals for this hospitalization and ongoing recovery are:: To return to SNF for rehab, then return back home. CMS Medicare.gov Compare Post Acute Care list provided to:: Patient Choice offered to / list presented to : Patient  Expected Discharge Plan and Services Expected Discharge Plan: [place] Choice: [place] arrangements for the past 2 months: [place], [place]                                      Prior Living Arrangements/Services Living arrangements for the past 2 months: [place], [place] Lives with:: Adult Children Patient language and need for interpreter reviewed:: No Do you feel safe going back to the place where you live?: No   Patient feels she needs to continue with therapy before returning home.  Need for Family Participation in Patient Care: No (Comment) Care giver support system in place?: No (comment)   Criminal Activity/Legal Involvement Pertinent to Current Situation/Hospitalization: No - Comment as needed  Activities  of Daily Living Home Assistive Devices/Equipment: Electric scooter ADL Screening (condition at time of admission) Patient's cognitive ability adequate to safely complete daily activities?: Yes Is the patient deaf or have difficulty hearing?: No Does the patient have difficulty seeing, even when wearing glasses/contacts?: Yes Does the patient have difficulty concentrating, remembering, or making decisions?: No Patient able to express need for assistance with ADLs?: Yes Does the patient have difficulty dressing or bathing?: Yes Independently performs ADLs?: No Communication: Independent Dressing (OT): Needs assistance Is this a change from baseline?: Pre-admission baseline Grooming: Needs assistance Is this a change from baseline?: Pre-admission baseline Feeding: Needs assistance Is this a change from baseline?: Pre-admission baseline Bathing: Needs assistance Is this a change from baseline?: Pre-admission baseline Toileting: Dependent Is this a change from baseline?: Pre-admission baseline In/Out Bed: Dependent Is this a change from baseline?: Pre-admission baseline Walks in Home: Needs assistance, Dependent Is this a change from baseline?: Pre-admission baseline Does the patient have difficulty walking or climbing stairs?: Yes Weakness of Legs: None(Bilateral BKA) Weakness of Arms/Hands: None  Permission Sought/Granted Permission sought to share information with : Facility Sport and exercise psychologist Permission granted to share information with : Yes, Verbal Permission Granted  Share Information with NAME: Wall,Jennifer Daughter [phone]  [phone]  Permission granted to share info w AGENCY: SNF admissions        Emotional Assessment Appearance:: Appears stated age Attitude/Demeanor/Rapport: Engaged Affect (typically observed): Appropriate, Accepting, Calm, Stable Orientation: : Oriented to Self,  Oriented to Place, Oriented to  Time, Oriented to Situation Alcohol /  Substance Use: Not Applicable Psych Involvement: No (comment)  Admission diagnosis:  Weakness [R53.1] Acute cystitis without hematuria [N30.00] Patient Active Problem List   Diagnosis Date Noted  . Acute lower UTI 08/07/2019  . Dilated cardiomyopathy ([place]) 06/27/2019  . Unstable angina ([place]) 06/26/2019  . NSTEMI (non-ST elevated myocardial infarction) ([place]) 11/18/2018  . CLL (chronic lymphocytic leukemia) ([place]) 10/23/2018  . Pressure injury of skin 10/19/2018  . CHF (congestive heart failure) ([place]) 10/16/2018  . CAP (community acquired pneumonia) 04/14/2018  . UTI (urinary tract infection) 03/07/2018  . Atrial fibrillation with slow ventricular response ([place]) 07/18/2016  . Chronic diastolic CHF (congestive heart failure) ([place])   . SOB (shortness of breath)   . Acute on chronic systolic heart failure ([place]) 08/20/2015  . CKD (chronic kidney disease), stage IV ([place])   . Ischemic cardiomyopathy   . Chronic combined systolic and diastolic CHF (congestive heart failure) ([place])   . Essential hypertension 06/18/2015  . Systolic and diastolic CHF, acute on chronic ([place])   . Chronic renal failure syndrome, stage 5 (HCC)   . Angina pectoris ([place])   . Coronary artery disease involving native coronary artery of native heart with angina pectoris with documented spasm ([place])   . 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 ([place])   . Persistent atrial fibrillation ([place])   . Bradycardia   . Chest pain 06/12/2015   PCP:  System, Pcp Not In Pharmacy:   [place], [place] [address] Phone: [phone] Fax: [place], [place] AT [facility] [address] [address] Phone: [phone] Fax: [phone]     Social Determinants of Health ([place]) Interventions    Readmission Risk  Interventions Readmission Risk Prevention Plan 06/27/2019 06/27/2019 02/18/2019  Transportation Screening - Complete Complete  Medication Review (RN Care Manager) - Complete -  PCP or Specialist appointment within 3-5 days of discharge - Complete Complete  HRI or Home Care Consult - Complete (No Data)  SW Recovery Care/Counseling Consult - Complete (No Data)  Palliative Care Screening - Complete Not Branch Not Applicable Not Complete Not Applicable  SNF Comments - pending PT eval -  Some recent data might be hidden

## 2019-08-09 DIAGNOSIS — N390 Urinary tract infection, site not specified: Secondary | ICD-10-CM | POA: Diagnosis not present

## 2019-08-09 LAB — GLUCOSE, CAPILLARY
Glucose-Capillary: 242 mg/dL — ABNORMAL HIGH (ref 70–99)
Glucose-Capillary: 172 mg/dL — ABNORMAL HIGH (ref 70–99)
Glucose-Capillary: 166 mg/dL — ABNORMAL HIGH (ref 70–99)
Glucose-Capillary: 244 mg/dL — ABNORMAL HIGH (ref 70–99)

## 2019-08-09 NOTE — Progress Notes (Signed)
1800 dose of Torsemide held due to patient low BP (ongoing/chronic). MD notified. Pt. Asymptomatic.

## 2019-08-09 NOTE — Progress Notes (Signed)
[place] [facility] [place] NAME: Caroline Hernandez    MR#:  [id number]  [place]:  01-16-1946  SUBJECTIVE:  patient was discharged from rehab 2 days ago straight went to dialysis thereafter went home was very weak fell sick and brought back to the emergency room as she could not get out of the car even with help.  Patient hemodynamically stable. Blood pressure bit on lower side however patient asymptomatic. No fever. HR remains 45-60--asymptomatic for now good appetite.  REVIEW OF SYSTEMS:   Review of Systems  Constitutional: Negative for chills, fever and weight loss.  HENT: Negative for ear discharge, ear pain and nosebleeds.   Eyes: Negative for blurred vision, pain and discharge.  Respiratory: Negative for sputum production, shortness of breath, wheezing and stridor.   Cardiovascular: Negative for chest pain, palpitations, orthopnea and PND.  Gastrointestinal: Negative for abdominal pain, diarrhea, nausea and vomiting.  Genitourinary: Negative for frequency and urgency.  Musculoskeletal: Negative for back pain and joint pain.  Neurological: Positive for weakness. Negative for sensory change, speech change and focal weakness.  Psychiatric/Behavioral: Negative for depression and hallucinations. The patient is not nervous/anxious.    Tolerating Diet: yes Tolerating PT: rehab  DRUG ALLERGIES:   Allergies  Allergen Reactions  . Piperacillin Other (See Comments)    Renal failure  . 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 See ID note**PER PT-CAUSED RENAL FAILURE**  . Zosyn [Piperacillin Sod-Tazobactam So] Other (See Comments)    Renal failure    VITALS:  Blood pressure 128/73, pulse (!) 47, temperature 98 F (36.7 C), temperature source Oral, resp. rate 19, height 5\' 2"  (1.575 m), weight 106.8 kg, SpO2 100 %.  PHYSICAL EXAMINATION:   Physical Exam  GENERAL:  73 y.o.-year-old patient lying in the  bed with no acute distress. Chronically ill EYES: Pupils equal, round, reactive to light and accommodation. No scleral icterus. Extraocular muscles intact.  HEENT: Head atraumatic, normocephalic. Oropharynx and nasopharynx clear.  NECK:  Supple, no jugular venous distention. No thyroid enlargement, no tenderness.  LUNGS: Normal breath sounds bilaterally, no wheezing, rales, rhonchi. No use of accessory muscles of respiration.  CARDIOVASCULAR: S1, S2 normal. No murmurs, rubs, or gallops.  ABDOMEN: Soft, nontender, nondistended. Bowel sounds present. No organomegaly or mass. PD cath + HD Cath + EXTREMITIES: bilateral below knee amputation stump looks okay NEUROLOGIC: grossly nonfocal. PSYCHIATRIC:  patient is alert and oriented x 3.  SKIN: No obvious rash, lesion, or ulcer.   LABORATORY PANEL:  CBC Recent Labs  Lab 08/07/19 0446  WBC 23.8*  HGB 9.4*  HCT 28.8*  PLT 165    Chemistries  Recent Labs  Lab 08/07/19 0446  08/08/19 0614  NA 135  --  135  K 3.1*  --  4.3  CL 99  --  99  CO2 25  --  25  GLUCOSE 146*  --  133*  BUN 12  --  20  CREATININE 3.62*  --  4.74*  CALCIUM 8.2*  --  8.6*  MG  --    < > 2.3  AST 18  --   --   ALT 8  --   --   ALKPHOS 72  --   --   BILITOT 0.9  --   --    < > = values in this interval not displayed.   Cardiac Enzymes No results for input(s): TROPONINI in the last 168 hours.  RADIOLOGY:  No results found. ASSESSMENT AND PLAN:  Caroline Hernandez is a 73 y.o. female  With extensive PMHx including CAD, CHF, ESRD, CHF, recurrent UTIs here with weakness. Pt went to HD today as scheduled. She states she has felt somewhat fatigued x days 2/2 a UTI, for which she is on Cipro. She went to HD today and felt weak after, but reportedly was near time for d/c for her SNF and was discharged from [facility] facility to go  home for the holidays. Pt reports that after HD, she felt incredibly weak and fatigued.  #Generalized weakness, chronic fatiguability with relative  hypotension -hold Norvasc -decreased dose of torsemide 20 BID -patient feels better today. -At baseline she transfers from bed to chair. She has bilateral lower extremity processes. At home she has electric wheelchair also per daughter -patient just got out of [facility] yesterday. Per daughter to for people to get her out of the chair but patient could not enter the house and was brought to the emergency room -physical therapy, occupational therapy to see today -CSW for D/c planning  #Abnormal UA -given history of end-stage renal disease on dialysis and very little urine output I feel this is colonization -has not had fever, denies any dysuria symptoms -received IV Rocephin x2 and was on Cipro before coming to the hospital.--pt asymptomatic--will d/c abxs -urine culture multiple species -patient has had antibiotics couple Hernandez lately -this was discussed with patient's daughter Caroline Hernandez-- she voiced understanding  #Chronic atrial fibrillation -not on beta-blockers. Continue eliquis -HR 17-61  #Chronic systolic/diastolic heart failure secondary to ischemic cardiomyopathy EF of 25% -recent my of you in October 2020 showed large scar and reduced EF but no ischemia -patient not in heart failure  #End-stage renal disease on hemodialysis -Dr. Zollie Scale to see patient for dialysis -getting PD here -she has HD cath coz she went fot rehab from 07/02/2019--08/05/2019  #Type II diabetes with diabetic nephropathy/end-stage renal disease/diabetic neuropathy -insulin Lantus and sliding scale  #Peripheral arterial disease status post bilateral below knee amputation -patient has prosthesis and electric wheelchair at home  #Social worker for discharge planning-- according to social worker she does not have many rehab days left. If insurance denies plan B would be discharged home with home health.  Patient and daughter Caroline Malta aware left a message for her this morning  Family  communication : left msg for [employer] Hernandez daughter on the phone Consults : nephrology Discharge Disposition : to be determined CODE STATUS: full DVT Prophylaxis : heparin  TOTAL TIME TAKING CARE OF THIS PATIENT: *25** minutes.  >50% time spent on counselling and coordination of care  POSSIBLE D/C IN *1-2** DAYS, DEPENDING ON CLINICAL CONDITION.  Note: This dictation was prepared with Dragon dictation along with smaller phrase technology. Any transcriptional errors that result from this process are unintentional.  Fritzi Mandes M.D on 08/09/2019 at 1:01 PM  Between 7am to 6pm - Pager - [phone]  After 6pm go to [email or web address]  [facility]   CC: Primary care physician; System, Pcp Not InPatient ID: Caroline Hernandez, female   DOB: September 13, 1945, 74 y.o.   MRN: [id number]

## 2019-08-09 NOTE — Consult Note (Signed)
[facility] for Electrolyte Monitoring and Replacement   Recent Labs: Potassium (mmol/L)  Date Value  08/08/2019 4.3   Magnesium (mg/dL)  Date Value  08/08/2019 2.3   Calcium (mg/dL)  Date Value  08/08/2019 8.6 (L)   Albumin (g/dL)  Date Value  08/07/2019 2.9 (L)  11/18/2015 4.1   Phosphorus (mg/dL)  Date Value  06/26/2019 8.8 (H)   Sodium (mmol/L)  Date Value  08/08/2019 135  10/07/2018 142   Corrected Ca:   Assessment: 73 y.o. female,  w hypertension, hyperlipidemia, DM2, ESRD on PD, PAD s/p bilateral BKA, CAD, CHF (EF 45%), Chronic Afib, CLL presents with c/o weakness related to hypotension. She was on torsemide PTA which has been continued at a reduced dose of 20 mg BID. Pt is on sevelamer, nephro to see patient for HD/PD plan.   Goal of Therapy:  Given cardiac history: Potassium 4.0 - 5.1 mmol/L Magnesium 2.0 - 2.4 mg/dL Other electrolytes WNL  Plan:  Peritoneal Dialysis on 11/27  F/u labs in am  Noralee Space ,PharmD, BCPS Clinical Pharmacist 08/09/2019 10:16 AM

## 2019-08-09 NOTE — Progress Notes (Signed)
Pd started 

## 2019-08-09 NOTE — Plan of Care (Signed)
  Problem: Education: Goal: Knowledge of General Education information will improve Description: Including pain rating scale, medication(s)/side effects and non-pharmacologic comfort measures Outcome: Progressing   Problem: Clinical Measurements: Goal: Will remain free from infection Outcome: Progressing   

## 2019-08-09 NOTE — Progress Notes (Signed)
[facility]  ROUNDING NOTE   Subjective:  Patient seen at bedside. Completed peritoneal dialysis overnight. Ultrafiltration was 1.4 L. Appears to be in good spirits.   Objective:  Vital signs in last 24 hours:  Temp:  [97.8 F (36.6 C)-98.1 F (36.7 C)] 98 F (36.7 C) (11/28 0753) Pulse Rate:  [47-65] 47 (11/28 0753) Resp:  [18-19] 19 (11/28 0753) BP: (95-128)/(49-73) 128/73 (11/28 0753) SpO2:  [94 %-100 %] 100 % (11/28 0753) Weight:  [106.8 kg] 106.8 kg (11/28 0500)  Weight change: 1.27 kg Filed Weights   08/07/19 0331 08/08/19 0521 08/09/19 0500  Weight: 103.4 kg 105.5 kg 106.8 kg    Intake/Output: I/O last 3 completed shifts: In: 329 [P.O.:240; I.V.:13.6; IV Piggyback:240.4] Out: 200 [Urine:200]   Intake/Output this shift:  Total I/O In: 240 [P.O.:240] Out: 1452 [Other:1452]  Physical Exam: General: No acute distress  Head: Normocephalic, atraumatic. Moist oral mucosal membranes  Eyes: Anicteric  Neck: Supple, trachea midline  Lungs:  Clear to auscultation, normal effort  Heart: S1S2 no rubs  Abdomen:  Soft, nontender, bowel sounds present  Extremities: B/L LE amputations, trace edema in limbs  Neurologic: Awake, alert, following commands  Skin: No lesions  Access: PD catheter and IJ PC in place    Basic Metabolic Panel: Recent Labs  Lab 08/06/19 1629 08/07/19 0446 08/07/19 1013 08/08/19 0614  NA 134* 135  --  135  K 2.8* 3.1*  --  4.3  CL 97* 99  --  99  CO2 25 25  --  25  GLUCOSE 207* 146*  --  133*  BUN 9 12  --  20  CREATININE 2.96* 3.62*  --  4.74*  CALCIUM 8.0* 8.2*  --  8.6*  MG  --   --  1.8 2.3    Liver Function Tests: Recent Labs  Lab 08/07/19 0446  AST 18  ALT 8  ALKPHOS 72  BILITOT 0.9  PROT 5.3*  ALBUMIN 2.9*   No results for input(s): LIPASE, AMYLASE in the last 168 hours. No results for input(s): AMMONIA in the last 168 hours.  CBC: Recent Labs  Lab 08/06/19 1629 08/07/19 0446  WBC 25.5* 23.8*   HGB 10.4* 9.4*  HCT 34.4* 28.8*  MCV 91.5 85.5  PLT 182 165    Cardiac Enzymes: No results for input(s): CKTOTAL, CKMB, CKMBINDEX, TROPONINI in the last 168 hours.  BNP: Invalid input(s): POCBNP  CBG: Recent Labs  Lab 08/08/19 1140 08/08/19 1645 08/08/19 2036 08/09/19 0754 08/09/19 1145  GLUCAP 126* 120* 129* 242* 172*    Microbiology: Results for orders placed or performed during the hospital encounter of 08/06/19  Urine Culture     Status: Abnormal   Collection Time: 08/06/19 10:41 PM   Specimen: Urine, Catheterized  Result Value Ref Range Status   Specimen Description   Final    URINE, CATHETERIZED Performed at [facility], [address] [place], [place] [address]    Special Requests   Final    NONE Performed at [facility], [address] [place], [place] [address]    Culture MULTIPLE SPECIES PRESENT, SUGGEST RECOLLECTION (A)  Final   Report Status 08/08/2019 FINAL  Final  SARS CORONAVIRUS 2 (TAT 6-24 HRS) Nasopharyngeal Nasopharyngeal Swab     Status: None   Collection Time: 08/07/19  1:13 AM   Specimen: Nasopharyngeal Swab  Result Value Ref Range Status   SARS Coronavirus 2 NEGATIVE NEGATIVE Final    Comment: (NOTE) SARS-CoV-2 target nucleic  acids are NOT DETECTED. The SARS-CoV-2 RNA is generally detectable in upper and lower respiratory specimens during the acute phase of infection. Negative results do not preclude SARS-CoV-2 infection, do not rule out co-infections with other pathogens, and should not be used as the sole basis for treatment or other patient management decisions. Negative results must be combined with clinical observations, patient history, and epidemiological information. The expected result is Negative. Fact Sheet for Patients: SugarRoll.be Fact Sheet for Healthcare Providers: https://www.woods-mathews.com/ This test is not yet approved or cleared by the [place]  FDA and  has been authorized for detection and/or diagnosis of SARS-CoV-2 by FDA under an Emergency Use Authorization (EUA). This EUA will remain  in effect (meaning this test can be used) for the duration of the COVID-19 declaration under Section 56 4(b)(1) of the Act, 21 U.S.C. section 360bbb-3(b)(1), unless the authorization is terminated or revoked sooner. Performed at [facility], [place] [address], [place], [place] [address]   MRSA PCR Screening     Status: Abnormal   Collection Time: 08/07/19  4:12 AM   Specimen: Nasal Mucosa; Nasopharyngeal  Result Value Ref Range Status   MRSA by PCR POSITIVE (A) NEGATIVE Final    Comment:        The GeneXpert MRSA Assay (FDA approved for NASAL specimens only), is one component of a comprehensive MRSA colonization surveillance program. It is not intended to diagnose MRSA infection nor to guide or monitor treatment for MRSA infections. RESULT CALLED TO, READ BACK BY AND VERIFIED WITH: Corine Shelter RN [id number] 08/07/2019 HNM Performed at [facility], [address] [place], [place] [address]     Coagulation Studies: No results for input(s): LABPROT, INR in the last 72 hours.  Urinalysis: Recent Labs    08/06/19 2241  COLORURINE YELLOW*  LABSPEC 1.006  PHURINE 5.0  GLUCOSEU NEGATIVE  HGBUR MODERATE*  BILIRUBINUR NEGATIVE  KETONESUR NEGATIVE  PROTEINUR 100*  NITRITE NEGATIVE  LEUKOCYTESUR LARGE*      Imaging: No results found.   Medications:   . sodium chloride Stopped (08/08/19 0508)   . amitriptyline  25 mg Oral QHS  . apixaban  5 mg Oral BID  . atorvastatin  40 mg Oral q1800  . Chlorhexidine Gluconate Cloth  6 each Topical Q0600  . clopidogrel  75 mg Oral Daily  . gabapentin  100 mg Oral BID  . gentamicin cream  1 application Topical Daily  . insulin aspart  0-5 Units Subcutaneous QHS  . insulin aspart  0-6 Units Subcutaneous TID WC  . insulin glargine  12 Units Subcutaneous Daily  . iron  polysaccharides  150 mg Oral Daily  . isosorbide mononitrate  30 mg Oral Daily  . Melatonin  5 mg Oral QHS  . mupirocin ointment  1 application Nasal BID  . pantoprazole  40 mg Oral QHS  . sertraline  100 mg Oral QHS  . sevelamer carbonate  2,400 mg Oral TID WC  . sodium chloride flush  3 mL Intravenous Once  . sodium chloride flush  3 mL Intravenous Q12H  . torsemide  20 mg Oral BID   sodium chloride, acetaminophen **OR** acetaminophen, heparin, sodium chloride flush  Assessment/ Plan:  73 y.o. female with ESRD on PD, bilateral BKA, coronary artery disease status post CABG, chronic systolic heart failure, CLL, diabetes mellitus type 2, hypertension, GERD, hyperlipidemia, ischemic cardiomyopathy, peripheral arterial disease.  [facility] Receiving [facility] up hemo at [facility].   1.  ESRD  on backup hemodialysis but transitioning back to peritoneal dialysis.  For the past 3 weeks the patient has been at a rehabilitation facility.  However she was recently discharged to home.  Patient could not transfer however and therefore was brought back to the emergency department.   -Patient tolerated reinitiation of peritoneal dialysis quite well.  Ultrafiltration was 1.4 kg.  We will plan for peritoneal dialysis tonight as well.  Continue current prescription.  2.  Anemia of chronic kidney disease.   Lab Results  Component Value Date   HGB 9.4 (L) 08/07/2019  Resume Epogen as an outpatient.  3.  Secondary hyperparathyroidism.   Lab Results  Component Value Date   CALCIUM 8.6 (L) 08/08/2019   PHOS 8.8 (H) 06/26/2019  Phosphorus quite high at 8.8.  Maintain the patient on Renvela.   4.  Generalized debility.  Patient unable to perform transfers at the moment.  However it appears that she is exhausted her Medicare days for further stay at a rehabilitation center.  Management as per social work, physical therapy, and hospitalist.   LOS: 2 Orlinda Slomski 11/28/20203:46 PM

## 2019-08-09 NOTE — Progress Notes (Signed)
Pd completed, exit site dressing changed.

## 2019-08-10 ENCOUNTER — Encounter: Payer: Self-pay | Admitting: Internal Medicine

## 2019-08-10 DIAGNOSIS — N186 End stage renal disease: Secondary | ICD-10-CM

## 2019-08-10 DIAGNOSIS — R5381 Other malaise: Secondary | ICD-10-CM

## 2019-08-10 LAB — GLUCOSE, CAPILLARY
Glucose-Capillary: 144 mg/dL — ABNORMAL HIGH (ref 70–99)
Glucose-Capillary: 124 mg/dL — ABNORMAL HIGH (ref 70–99)
Glucose-Capillary: 141 mg/dL — ABNORMAL HIGH (ref 70–99)
Glucose-Capillary: 203 mg/dL — ABNORMAL HIGH (ref 70–99)

## 2019-08-10 LAB — BASIC METABOLIC PANEL
Anion gap: 10 (ref 5–15)
BUN: 29 mg/dL — ABNORMAL HIGH (ref 8–23)
CO2: 26 mmol/L (ref 22–32)
Calcium: 9.1 mg/dL (ref 8.9–10.3)
Chloride: 99 mmol/L (ref 98–111)
Creatinine, Ser: 5.68 mg/dL — ABNORMAL HIGH (ref 0.44–1.00)
GFR calc Af Amer: 8 mL/min — ABNORMAL LOW (ref >60)
GFR calc non Af Amer: 7 mL/min — ABNORMAL LOW (ref >60)
Glucose, Bld: 188 mg/dL — ABNORMAL HIGH (ref 70–99)
Potassium: 4.3 mmol/L (ref 3.5–5.1)
Sodium: 135 mmol/L (ref 135–145)

## 2019-08-10 LAB — MAGNESIUM: Magnesium: 2.4 mg/dL (ref 1.7–2.4)

## 2019-08-10 LAB — CBC
HCT: 32.2 % — ABNORMAL LOW (ref 36.0–46.0)
Hemoglobin: 9.6 g/dL — ABNORMAL LOW (ref 12.0–15.0)
MCH: 27.5 pg (ref 26.0–34.0)
MCHC: 29.8 g/dL — ABNORMAL LOW (ref 30.0–36.0)
MCV: 92.3 fL (ref 80.0–100.0)
Platelets: 173 10*3/uL (ref 150–400)
RBC: 3.49 MIL/uL — ABNORMAL LOW (ref 3.87–5.11)
RDW: 17.7 % — ABNORMAL HIGH (ref 11.5–15.5)
WBC: 24.6 10*3/uL — ABNORMAL HIGH (ref 4.0–10.5)
nRBC: 0 % (ref 0.0–0.2)

## 2019-08-10 LAB — PHOSPHORUS: Phosphorus: 3.5 mg/dL (ref 2.5–4.6)

## 2019-08-10 NOTE — Progress Notes (Signed)
[facility]  ROUNDING NOTE   Subjective:  Patient tolerated PD well. Resting comfortably in bed at the moment.   Objective:  Vital signs in last 24 hours:  Temp:  [97.6 F (36.4 C)-98.4 F (36.9 C)] 98.4 F (36.9 C) (11/29 0739) Pulse Rate:  [43-53] 49 (11/29 0739) Resp:  [18-20] 18 (11/29 0739) BP: (88-109)/(50-59) 109/50 (11/29 0739) SpO2:  [94 %-97 %] 96 % (11/29 0739) Weight:  [976 kg] 107 kg (11/29 0514)  Weight change: 0.272 kg Filed Weights   08/08/19 0521 08/09/19 0500 08/10/19 0514  Weight: 105.5 kg 106.8 kg 107 kg    Intake/Output: I/O last 3 completed shifts: In: 240 [P.O.:240] Out: 1552 [Urine:100; Other:1452]   Intake/Output this shift:  Total I/O In: 240 [P.O.:240] Out: -   Physical Exam: General: No acute distress  Head: Normocephalic, atraumatic. Moist oral mucosal membranes  Eyes: Anicteric  Neck: Supple, trachea midline  Lungs:  Clear to auscultation, normal effort  Heart: S1S2 no rubs  Abdomen:  Soft, nontender, bowel sounds present  Extremities: B/L LE amputations, trace edema in limbs  Neurologic: Awake, alert, following commands  Skin: No lesions  Access: PD catheter and IJ PC in place    Basic Metabolic Panel: Recent Labs  Lab 08/06/19 1629 08/07/19 0446 08/07/19 1013 08/08/19 0614 08/10/19 0521  NA 134* 135  --  135 135  K 2.8* 3.1*  --  4.3 4.3  CL 97* 99  --  99 99  CO2 25 25  --  25 26  GLUCOSE 207* 146*  --  133* 188*  BUN 9 12  --  20 29*  CREATININE 2.96* 3.62*  --  4.74* 5.68*  CALCIUM 8.0* 8.2*  --  8.6* 9.1  MG  --   --  1.8 2.3 2.4    Liver Function Tests: Recent Labs  Lab 08/07/19 0446  AST 18  ALT 8  ALKPHOS 72  BILITOT 0.9  PROT 5.3*  ALBUMIN 2.9*   No results for input(s): LIPASE, AMYLASE in the last 168 hours. No results for input(s): AMMONIA in the last 168 hours.  CBC: Recent Labs  Lab 08/06/19 1629 08/07/19 0446 08/10/19 0521  WBC 25.5* 23.8* 24.6*  HGB 10.4* 9.4* 9.6*   HCT 34.4* 28.8* 32.2*  MCV 91.5 85.5 92.3  PLT 182 165 173    Cardiac Enzymes: No results for input(s): CKTOTAL, CKMB, CKMBINDEX, TROPONINI in the last 168 hours.  BNP: Invalid input(s): POCBNP  CBG: Recent Labs  Lab 08/09/19 1145 08/09/19 1614 08/09/19 2102 08/10/19 0737 08/10/19 1120  GLUCAP 172* 166* 244* 144* 124*    Microbiology: Results for orders placed or performed during the hospital encounter of 08/06/19  Urine Culture     Status: Abnormal   Collection Time: 08/06/19 10:41 PM   Specimen: Urine, Catheterized  Result Value Ref Range Status   Specimen Description   Final    URINE, CATHETERIZED Performed at [facility], [address], [place], [place] [address]    Special Requests   Final    NONE Performed at [facility], [address] [place], [place] [address]    Culture MULTIPLE SPECIES PRESENT, SUGGEST RECOLLECTION (A)  Final   Report Status 08/08/2019 FINAL  Final  SARS CORONAVIRUS 2 (TAT 6-24 HRS) Nasopharyngeal Nasopharyngeal Swab     Status: None   Collection Time: 08/07/19  1:13 AM   Specimen: Nasopharyngeal Swab  Result Value Ref Range Status   SARS Coronavirus 2 NEGATIVE NEGATIVE  Final    Comment: (NOTE) SARS-CoV-2 target nucleic acids are NOT DETECTED. The SARS-CoV-2 RNA is generally detectable in upper and lower respiratory specimens during the acute phase of infection. Negative results do not preclude SARS-CoV-2 infection, do not rule out co-infections with other pathogens, and should not be used as the sole basis for treatment or other patient management decisions. Negative results must be combined with clinical observations, patient history, and epidemiological information. The expected result is Negative. Fact Sheet for Patients: SugarRoll.be Fact Sheet for Healthcare Providers: https://www.woods-mathews.com/ This test is not yet approved or cleared by the [place]  FDA and  has been authorized for detection and/or diagnosis of SARS-CoV-2 by FDA under an Emergency Use Authorization (EUA). This EUA will remain  in effect (meaning this test can be used) for the duration of the COVID-19 declaration under Section 56 4(b)(1) of the Act, 21 U.S.C. section 360bbb-3(b)(1), unless the authorization is terminated or revoked sooner. Performed at [facility], [place] [address], [place], [place] [address]   MRSA PCR Screening     Status: Abnormal   Collection Time: 08/07/19  4:12 AM   Specimen: Nasal Mucosa; Nasopharyngeal  Result Value Ref Range Status   MRSA by PCR POSITIVE (A) NEGATIVE Final    Comment:        The GeneXpert MRSA Assay (FDA approved for NASAL specimens only), is one component of a comprehensive MRSA colonization surveillance program. It is not intended to diagnose MRSA infection nor to guide or monitor treatment for MRSA infections. RESULT CALLED TO, READ BACK BY AND VERIFIED WITH: Corine Shelter RN [id number] 08/07/2019 HNM Performed at [facility], [address] [place], [place] [address]     Coagulation Studies: No results for input(s): LABPROT, INR in the last 72 hours.  Urinalysis: No results for input(s): COLORURINE, LABSPEC, PHURINE, GLUCOSEU, HGBUR, BILIRUBINUR, KETONESUR, PROTEINUR, UROBILINOGEN, NITRITE, LEUKOCYTESUR in the last 72 hours.  Invalid input(s): APPERANCEUR    Imaging: No results found.   Medications:   . sodium chloride Stopped (08/08/19 0508)   . amitriptyline  25 mg Oral QHS  . apixaban  5 mg Oral BID  . atorvastatin  40 mg Oral q1800  . Chlorhexidine Gluconate Cloth  6 each Topical Q0600  . clopidogrel  75 mg Oral Daily  . gabapentin  100 mg Oral BID  . gentamicin cream  1 application Topical Daily  . insulin aspart  0-5 Units Subcutaneous QHS  . insulin aspart  0-6 Units Subcutaneous TID WC  . insulin glargine  12 Units Subcutaneous Daily  . iron polysaccharides  150 mg Oral  Daily  . isosorbide mononitrate  30 mg Oral Daily  . Melatonin  5 mg Oral QHS  . mupirocin ointment  1 application Nasal BID  . pantoprazole  40 mg Oral QHS  . sertraline  100 mg Oral QHS  . sevelamer carbonate  2,400 mg Oral TID WC  . sodium chloride flush  3 mL Intravenous Once  . sodium chloride flush  3 mL Intravenous Q12H  . torsemide  20 mg Oral BID   sodium chloride, acetaminophen **OR** acetaminophen, heparin, sodium chloride flush  Assessment/ Plan:  73 y.o. female with ESRD on PD, bilateral BKA, coronary artery disease status post CABG, chronic systolic heart failure, CLL, diabetes mellitus type 2, hypertension, GERD, hyperlipidemia, ischemic cardiomyopathy, peripheral arterial disease.  [facility] for PD Receiving back up [facility]mo at [facility].   1.  ESRD on backup hemodialysis but transitioning back  to peritoneal dialysis.  For the past 3 weeks the patient has been at a rehabilitation facility.  However she was recently discharged to home.  Patient could not transfer however and therefore was brought back to the emergency department.   -Patient doing well with peritoneal dialysis.  If she goes home she will maintain on peritoneal dialysis.  If she goes back to another rehabilitation facility she will need to go back on hemodialysis.  Care management still working on this.  2.  Anemia of chronic kidney disease.   Lab Results  Component Value Date   HGB 9.6 (L) 08/10/2019  Resume Epogen as an outpatient.  3.  Secondary hyperparathyroidism.   Lab Results  Component Value Date   CALCIUM 9.1 08/10/2019   PHOS 8.8 (H) 06/26/2019  Recheck serum phosphorus today.   4.  Generalized debility.  Patient unable to perform transfers at the moment.  However it appears that she is exhausted her Medicare days for further stay at a rehabilitation center.  Management as per social work, physical therapy, and hospitalist.   LOS: 3 Caroline Hernandez 11/29/202012:21 PM

## 2019-08-10 NOTE — Progress Notes (Signed)
Pd started 

## 2019-08-10 NOTE — Consult Note (Signed)
[facility] for Electrolyte Monitoring and Replacement   Recent Labs: Potassium (mmol/L)  Date Value  08/10/2019 4.3   Magnesium (mg/dL)  Date Value  08/10/2019 2.4   Calcium (mg/dL)  Date Value  08/10/2019 9.1   Albumin (g/dL)  Date Value  08/07/2019 2.9 (L)  11/18/2015 4.1   Phosphorus (mg/dL)  Date Value  06/26/2019 8.8 (H)   Sodium (mmol/L)  Date Value  08/10/2019 135  10/07/2018 142   Corrected Ca:   Assessment: 73 y.o. female,  w hypertension, hyperlipidemia, DM2, ESRD on PD, PAD s/p bilateral BKA, CAD, CHF (EF 45%), Chronic Afib, CLL presents with c/o weakness related to hypotension. She was on torsemide PTA which has been continued at a reduced dose of 20 mg BID. Pt is on sevelamer, nephro to see patient for HD/PD plan.   Goal of Therapy:  Given cardiac history: Potassium 4.0 - 5.1 mmol/L Magnesium 2.0 - 2.4 mg/dL Other electrolytes WNL  Plan:  Peritoneal Dialysis on 11/27, 11/28 Electrolytes WNL.  On torsemide 20mg  bid No supplementation at this time.  F/u labs in am  Noralee Space ,PharmD, BCPS Clinical Pharmacist 08/10/2019 12:20 PM

## 2019-08-10 NOTE — Progress Notes (Addendum)
Progress Note    Caroline Hernandez  VEL:[id number] DOB: 1945-10-06  DOA: 08/06/2019 PCP: System, Pcp Not In      Brief Narrative:    Medical records reviewed and are as summarized below:  Caroline Hernandez is an 73 y.o. female with medical history significant for CAD, CHF, end-stage renal disease, recurrent UTIs.  She was brought to the hospital because of fatigue, generalized weakness and inability to transfer.      Assessment/Plan:   Principal Problem:   Acute lower UTI Active Problems:   ESRD (end stage renal disease) (HCC)   Body mass index is 43.16 kg/m.    Generalized weakness/chronic fatigue/debility: PT and OT.  Follow-up with social worker to assist with disposition  ESRD: She has been getting peritoneal dialysis.  Follow-up with nephrologist.  Abnormal urinalysis: Asymptomatic.  She was on ciprofloxacin prior to admission and she got 2 doses of IV Rocephin in the hospital.  Chronic atrial fibrillation: Continue Eliquis.  Chronic systolic/diastolic CHF with EF of 25% on 2D echo in October 2020: Continue torsemide  IDDM: Continue Lantus.  CAD: She is on Plavix and Lipitor  She has chronic leukocytosis   Family Communication/Anticipated D/C date and plan/Code Status   DVT prophylaxis: Eliquis Code Status: Full code Family Communication: Plan discussed with the patient Disposition Plan: Possible discharge to SNF in 1 to 2 days      Subjective:   She complains of generalized weakness.  No vomiting, shortness of breath or chest pain.  Objective:    Vitals:   08/09/19 2000 08/09/19 2008 08/10/19 0514 08/10/19 0739  BP:  (!) 100/59 (!) 98/53 (!) 109/50  Pulse:  (!) 43 (!) 49 (!) 49  Resp:  20 20 18   Temp:  97.7 F (36.5 C) 97.6 F (36.4 C) 98.4 F (36.9 C)  TempSrc:  Oral Oral Oral  SpO2: 97% 97% 94% 96%  Weight:   107 kg   Height:        Intake/Output Summary (Last 24 hours) at 08/10/2019 1432 Last data filed at 08/10/2019 1009  Gross per 24 hour  Intake 240 ml  Output 100 ml  Net 140 ml   Filed Weights   08/08/19 0521 08/09/19 0500 08/10/19 0514  Weight: 105.5 kg 106.8 kg 107 kg    Exam:  GEN: NAD SKIN: PD catheter on the left lower abdomen.  Dialysis permacath on right upper chest wall EYES: EOMI ENT: MMM CV: RRR PULM: CTA B ABD: soft, obese, NT, +BS CNS: AAO x 3, non focal EXT: b/l BKA, no tenderness   Data Reviewed:   I have personally reviewed following labs and imaging studies:  Labs: Labs show the following:   Basic Metabolic Panel: Recent Labs  Lab 08/06/19 1629 08/07/19 0446 08/07/19 1013 08/08/19 0614 08/10/19 0521  NA 134* 135  --  135 135  K 2.8* 3.1*  --  4.3 4.3  CL 97* 99  --  99 99  CO2 25 25  --  25 26  GLUCOSE 207* 146*  --  133* 188*  BUN 9 12  --  20 29*  CREATININE 2.96* 3.62*  --  4.74* 5.68*  CALCIUM 8.0* 8.2*  --  8.6* 9.1  MG  --   --  1.8 2.3 2.4  PHOS  --   --   --   --  3.5   GFR Estimated Creatinine Clearance: 10.5 mL/min (A) (by C-G formula based on SCr of 5.68 mg/dL (H)). Liver Function  Tests: Recent Labs  Lab 08/07/19 0446  AST 18  ALT 8  ALKPHOS 72  BILITOT 0.9  PROT 5.3*  ALBUMIN 2.9*   No results for input(s): LIPASE, AMYLASE in the last 168 hours. No results for input(s): AMMONIA in the last 168 hours. Coagulation profile No results for input(s): INR, PROTIME in the last 168 hours.  CBC: Recent Labs  Lab 08/06/19 1629 08/07/19 0446 08/10/19 0521  WBC 25.5* 23.8* 24.6*  HGB 10.4* 9.4* 9.6*  HCT 34.4* 28.8* 32.2*  MCV 91.5 85.5 92.3  PLT 182 165 173   Cardiac Enzymes: No results for input(s): CKTOTAL, CKMB, CKMBINDEX, TROPONINI in the last 168 hours. BNP (last 3 results) No results for input(s): PROBNP in the last 8760 hours. CBG: Recent Labs  Lab 08/09/19 1145 08/09/19 1614 08/09/19 2102 08/10/19 0737 08/10/19 1120  GLUCAP 172* 166* 244* 144* 124*   D-Dimer: No results for input(s): DDIMER in the last 72 hours.  Hgb A1c: No results for input(s): HGBA1C in the last 72 hours. Lipid Profile: No results for input(s): CHOL, HDL, LDLCALC, TRIG, CHOLHDL, LDLDIRECT in the last 72 hours. Thyroid function studies: No results for input(s): TSH, T4TOTAL, T3FREE, THYROIDAB in the last 72 hours.  Invalid input(s): FREET3 Anemia work up: No results for input(s): VITAMINB12, FOLATE, FERRITIN, TIBC, IRON, RETICCTPCT in the last 72 hours. Sepsis Labs: Recent Labs  Lab 08/06/19 1629 08/06/19 2344 08/07/19 0446 08/10/19 0521  WBC 25.5*  --  23.8* 24.6*  LATICACIDVEN  --  1.2  --   --     Microbiology Recent Results (from the past 240 hour(s))  Urine Culture     Status: Abnormal   Collection Time: 08/06/19 10:41 PM   Specimen: Urine, Catheterized  Result Value Ref Range Status   Specimen Description   Final    URINE, CATHETERIZED Performed at [facility], [address], [place], [place] [address]    Special Requests   Final    NONE Performed at [facility], [address] [place], [place] [address]    Culture MULTIPLE SPECIES PRESENT, SUGGEST RECOLLECTION (A)  Final   Report Status 08/08/2019 FINAL  Final  SARS CORONAVIRUS 2 (TAT 6-24 HRS) Nasopharyngeal Nasopharyngeal Swab     Status: None   Collection Time: 08/07/19  1:13 AM   Specimen: Nasopharyngeal Swab  Result Value Ref Range Status   SARS Coronavirus 2 NEGATIVE NEGATIVE Final    Comment: (NOTE) SARS-CoV-2 target nucleic acids are NOT DETECTED. The SARS-CoV-2 RNA is generally detectable in upper and lower respiratory specimens during the acute phase of infection. Negative results do not preclude SARS-CoV-2 infection, do not rule out co-infections with other pathogens, and should not be used as the sole basis for treatment or other patient management decisions. Negative results must be combined with clinical observations, patient history, and epidemiological information. The expected result is Negative. Fact Sheet  for Patients: SugarRoll.be Fact Sheet for Healthcare Providers: https://www.woods-mathews.com/ This test is not yet approved or cleared by the [place] FDA and  has been authorized for detection and/or diagnosis of SARS-CoV-2 by FDA under an Emergency Use Authorization (EUA). This EUA will remain  in effect (meaning this test can be used) for the duration of the COVID-19 declaration under Section 56 4(b)(1) of the Act, 21 U.S.C. section 360bbb-3(b)(1), unless the authorization is terminated or revoked sooner. Performed at [facility], [place] [address] [place], [place] [address]   MRSA PCR Screening     Status:  Abnormal   Collection Time: 08/07/19  4:12 AM   Specimen: Nasal Mucosa; Nasopharyngeal  Result Value Ref Range Status   MRSA by PCR POSITIVE (A) NEGATIVE Final    Comment:        The GeneXpert MRSA Assay (FDA approved for NASAL specimens only), is one component of a comprehensive MRSA colonization surveillance program. It is not intended to diagnose MRSA infection nor to guide or monitor treatment for MRSA infections. RESULT CALLED TO, READ BACK BY AND VERIFIED WITH: Corine Shelter RN [id number] 08/07/2019 HNM Performed at [facility], [address] [place], [place] [address]     Procedures and diagnostic studies:  No results found.  Medications:   . amitriptyline  25 mg Oral QHS  . apixaban  5 mg Oral BID  . atorvastatin  40 mg Oral q1800  . Chlorhexidine Gluconate Cloth  6 each Topical Q0600  . clopidogrel  75 mg Oral Daily  . gabapentin  100 mg Oral BID  . gentamicin cream  1 application Topical Daily  . insulin aspart  0-5 Units Subcutaneous QHS  . insulin aspart  0-6 Units Subcutaneous TID WC  . insulin glargine  12 Units Subcutaneous Daily  . iron polysaccharides  150 mg Oral Daily  . isosorbide mononitrate  30 mg Oral Daily  . Melatonin  5 mg Oral QHS  . mupirocin ointment  1 application  Nasal BID  . pantoprazole  40 mg Oral QHS  . sertraline  100 mg Oral QHS  . sevelamer carbonate  2,400 mg Oral TID WC  . sodium chloride flush  3 mL Intravenous Once  . sodium chloride flush  3 mL Intravenous Q12H  . torsemide  20 mg Oral BID   Continuous Infusions: . sodium chloride Stopped (08/08/19 0508)     LOS: 3 days   Tyjay Galindo  [facility]   *Please refer to [email or web address], password [email or web address] to get updated schedule on who will round on this patient, as hospitalists switch teams weekly. If 7PM-7AM, please contact night-coverage at [email or web address], password [email or web address] for any overnight needs.  08/10/2019, 2:32 PM

## 2019-08-11 DIAGNOSIS — R531 Weakness: Secondary | ICD-10-CM | POA: Diagnosis not present

## 2019-08-11 DIAGNOSIS — I959 Hypotension, unspecified: Secondary | ICD-10-CM | POA: Diagnosis not present

## 2019-08-11 DIAGNOSIS — N390 Urinary tract infection, site not specified: Secondary | ICD-10-CM | POA: Diagnosis not present

## 2019-08-11 DIAGNOSIS — N186 End stage renal disease: Secondary | ICD-10-CM | POA: Diagnosis not present

## 2019-08-11 LAB — GLUCOSE, CAPILLARY
Glucose-Capillary: 162 mg/dL — ABNORMAL HIGH (ref 70–99)
Glucose-Capillary: 164 mg/dL — ABNORMAL HIGH (ref 70–99)
Glucose-Capillary: 127 mg/dL — ABNORMAL HIGH (ref 70–99)
Glucose-Capillary: 198 mg/dL — ABNORMAL HIGH (ref 70–99)

## 2019-08-11 MED ORDER — DOCUSATE SODIUM 100 MG PO CAPS
100.0000 mg | ORAL_CAPSULE | Freq: Every day | ORAL | Status: DC
  Administered 2019-08-12 – 2019-08-14 (×3): 100 mg via ORAL
  Filled 2019-08-11 (×3): qty 1

## 2019-08-11 MED ORDER — DOCUSATE SODIUM 50 MG/5ML PO LIQD
100.0000 mg | Freq: Every day | ORAL | Status: DC | PRN
  Administered 2019-08-11: 100 mg via ORAL
  Filled 2019-08-11 (×2): qty 10

## 2019-08-11 NOTE — Progress Notes (Addendum)
Per Dr. Mal Misty okay to hold torsemide due to low bp.Will continue to monitor.

## 2019-08-11 NOTE — Care Management Important Message (Signed)
Important Message  Patient Details  Name: Caroline Hernandez MRN: [id number] Date of Birth: 06/03/1946   Medicare Important Message Given:  Yes     Juliann Pulse A Keil Pickering 08/11/2019, 2:39 PM

## 2019-08-11 NOTE — Care Management CC44 (Signed)
Condition Code 44 Documentation Completed  Patient Details  Name: Cyanna Neace MRN: [id number] Date of Birth: April 02, 1946   Condition Code 44 given:  Yes Patient signature on Condition Code 44 notice:  Yes Documentation of 2 MD's agreement:  Yes Code 44 added to claim:  Yes    Ross Ludwig, LCSW 08/11/2019, 7:21 PM

## 2019-08-11 NOTE — Progress Notes (Signed)
[facility] TX started    08/11/19 1751  Cycler Setup  Total Number of Exchanges 4  Fill Volume 2500  Dianeal Solution Dextrose 2.5% in 6000 mL  Exit Site Care Performed Yes  Completion  Exit Site Care Performed Yes  Treatment Status Started  Hand-Off documentation  Report given to (Full Name) Beatris Ship, RN   Report received from (Full Name) Stacie Glaze, RN

## 2019-08-11 NOTE — Progress Notes (Signed)
Pre [facility]CPD Assessment    08/11/19 1748  Neurological  Level of Consciousness Alert  Orientation Level Oriented X4  Respiratory  Respiratory Pattern Regular;Unlabored  Chest Assessment Chest expansion symmetrical  Bilateral Breath Sounds Clear;Diminished  Cough None  Cardiac  Pulse Irregular  Heart Sounds No adventitious heart sounds  ECG Monitor Yes  Vascular  R Radial Pulse +2  L Radial Pulse +2  Edema Generalized  Psychosocial  Psychosocial (WDL) WDL

## 2019-08-11 NOTE — Progress Notes (Signed)
[facility]  ROUNDING NOTE   Subjective:   Peritoneal dialysis last night. Tolerated treatment well.   Objective:  Vital signs in last 24 hours:  Temp:  [97.5 F (36.4 C)-98.3 F (36.8 C)] 98.2 F (36.8 C) (11/30 1643) Pulse Rate:  [44-53] 44 (11/30 1643) Resp:  [18-20] 20 (11/30 1643) BP: (91-106)/(45-70) 100/45 (11/30 1643) SpO2:  [93 %-100 %] 93 % (11/30 1643) Weight:  [105.3 kg] 105.3 kg (11/30 0447)  Weight change: -1.724 kg Filed Weights   08/09/19 0500 08/10/19 0514 08/11/19 0447  Weight: 106.8 kg 107 kg 105.3 kg    Intake/Output: I/O last 3 completed shifts: In: 240 [P.O.:240] Out: 1763 [Urine:100; GURKY:7062]   Intake/Output this shift:  Total I/O In: 480 [P.O.:480] Out: 1604 [Other:1604]  Physical Exam: General: No acute distress  Head: Normocephalic, atraumatic. Moist oral mucosal membranes  Eyes: Anicteric  Neck: Supple, trachea midline  Lungs:  Clear to auscultation, normal effort  Heart: S1S2 no rubs  Abdomen:  Soft, nontender, bowel sounds present  Extremities: B/L LE amputations, trace edema in limbs  Neurologic: Awake, alert, following commands  Skin: No lesions  Access: PD catheter and IJ PC in place    Basic Metabolic Panel: Recent Labs  Lab 08/06/19 1629 08/07/19 0446 08/07/19 1013 08/08/19 0614 08/10/19 0521  NA 134* 135  --  135 135  K 2.8* 3.1*  --  4.3 4.3  CL 97* 99  --  99 99  CO2 25 25  --  25 26  GLUCOSE 207* 146*  --  133* 188*  BUN 9 12  --  20 29*  CREATININE 2.96* 3.62*  --  4.74* 5.68*  CALCIUM 8.0* 8.2*  --  8.6* 9.1  MG  --   --  1.8 2.3 2.4  PHOS  --   --   --   --  3.5    Liver Function Tests: Recent Labs  Lab 08/07/19 0446  AST 18  ALT 8  ALKPHOS 72  BILITOT 0.9  PROT 5.3*  ALBUMIN 2.9*   No results for input(s): LIPASE, AMYLASE in the last 168 hours. No results for input(s): AMMONIA in the last 168 hours.  CBC: Recent Labs  Lab 08/06/19 1629 08/07/19 0446 08/10/19 0521  WBC  25.5* 23.8* 24.6*  HGB 10.4* 9.4* 9.6*  HCT 34.4* 28.8* 32.2*  MCV 91.5 85.5 92.3  PLT 182 165 173    Cardiac Enzymes: No results for input(s): CKTOTAL, CKMB, CKMBINDEX, TROPONINI in the last 168 hours.  BNP: Invalid input(s): POCBNP  CBG: Recent Labs  Lab 08/10/19 1120 08/10/19 1639 08/10/19 2103 08/11/19 0752 08/11/19 1154  GLUCAP 124* 141* 203* 162* 164*    Microbiology: Results for orders placed or performed during the hospital encounter of 08/06/19  Urine Culture     Status: Abnormal   Collection Time: 08/06/19 10:41 PM   Specimen: Urine, Catheterized  Result Value Ref Range Status   Specimen Description   Final    URINE, CATHETERIZED Performed at [facility], [address], [place], [place] [address]    Special Requests   Final    NONE Performed at [facility], [address] [place], [place] [address]    Culture MULTIPLE SPECIES PRESENT, SUGGEST RECOLLECTION (A)  Final   Report Status 08/08/2019 FINAL  Final  SARS CORONAVIRUS 2 (TAT 6-24 HRS) Nasopharyngeal Nasopharyngeal Swab     Status: None   Collection Time: 08/07/19  1:13 AM   Specimen: Nasopharyngeal Swab  Result Value Ref Range Status   SARS Coronavirus 2 NEGATIVE NEGATIVE Final    Comment: (NOTE) SARS-CoV-2 target nucleic acids are NOT DETECTED. The SARS-CoV-2 RNA is generally detectable in upper and lower respiratory specimens during the acute phase of infection. Negative results do not preclude SARS-CoV-2 infection, do not rule out co-infections with other pathogens, and should not be used as the sole basis for treatment or other patient management decisions. Negative results must be combined with clinical observations, patient history, and epidemiological information. The expected result is Negative. Fact Sheet for Patients: SugarRoll.be Fact Sheet for Healthcare Providers: https://www.woods-mathews.com/ This test is not yet  approved or cleared by the [place] FDA and  has been authorized for detection and/or diagnosis of SARS-CoV-2 by FDA under an Emergency Use Authorization (EUA). This EUA will remain  in effect (meaning this test can be used) for the duration of the COVID-19 declaration under Section 56 4(b)(1) of the Act, 21 U.S.C. section 360bbb-3(b)(1), unless the authorization is terminated or revoked sooner. Performed at [facility], [place] [address], [place], [place] [address]   MRSA PCR Screening     Status: Abnormal   Collection Time: 08/07/19  4:12 AM   Specimen: Nasal Mucosa; Nasopharyngeal  Result Value Ref Range Status   MRSA by PCR POSITIVE (A) NEGATIVE Final    Comment:        The GeneXpert MRSA Assay (FDA approved for NASAL specimens only), is one component of a comprehensive MRSA colonization surveillance program. It is not intended to diagnose MRSA infection nor to guide or monitor treatment for MRSA infections. RESULT CALLED TO, READ BACK BY AND VERIFIED WITH: Corine Shelter RN [id number] 08/07/2019 HNM Performed at [facility], [address] [place], [place] [address]     Coagulation Studies: No results for input(s): LABPROT, INR in the last 72 hours.  Urinalysis: No results for input(s): COLORURINE, LABSPEC, PHURINE, GLUCOSEU, HGBUR, BILIRUBINUR, KETONESUR, PROTEINUR, UROBILINOGEN, NITRITE, LEUKOCYTESUR in the last 72 hours.  Invalid input(s): APPERANCEUR    Imaging: No results found.   Medications:   . sodium chloride Stopped (08/08/19 0508)   . amitriptyline  25 mg Oral QHS  . apixaban  5 mg Oral BID  . atorvastatin  40 mg Oral q1800  . clopidogrel  75 mg Oral Daily  . docusate sodium  100 mg Oral Daily  . gabapentin  100 mg Oral BID  . gentamicin cream  1 application Topical Daily  . insulin aspart  0-5 Units Subcutaneous QHS  . insulin aspart  0-6 Units Subcutaneous TID WC  . insulin glargine  12 Units Subcutaneous Daily  . iron  polysaccharides  150 mg Oral Daily  . isosorbide mononitrate  30 mg Oral Daily  . Melatonin  5 mg Oral QHS  . mupirocin ointment  1 application Nasal BID  . pantoprazole  40 mg Oral QHS  . sertraline  100 mg Oral QHS  . sevelamer carbonate  2,400 mg Oral TID WC  . sodium chloride flush  3 mL Intravenous Once  . sodium chloride flush  3 mL Intravenous Q12H  . torsemide  20 mg Oral BID   sodium chloride, acetaminophen **OR** acetaminophen, heparin, sodium chloride flush  Assessment/ Plan:  74 y.o. Caroline Hernandez female with ESRD on PD, bilateral BKA, coronary artery disease status post CABG, chronic systolic heart failure, CLL, diabetes mellitus type 2, hypertension, GERD, hyperlipidemia, ischemic cardiomyopathy, peripheral arterial disease.  [facility] Receiving back up hemo at [facility]  [place].   1.  ESRD on backup hemodialysis but transitioning back to peritoneal dialysis.  For the past 3 weeks the patient has been at a rehabilitation facility.  However she was recently discharged to home.  Patient could not transfer however and therefore was brought back to the emergency department.   -Patient doing well with peritoneal dialysis.  If she goes home she will maintain on peritoneal dialysis.  If she goes back to another rehabilitation facility she will need to go back on hemodialysis.    2.  Anemia of chronic kidney disease.   - EPO   3.  Secondary hyperparathyroidism.  Phosphorus and calcium at goal.  - sevelamer  4. Hypertension: blood pressure at goal.     LOS: 4 Meda Dudzinski 11/30/20204:47 PM

## 2019-08-11 NOTE — Consult Note (Signed)
[facility] for Electrolyte Monitoring and Replacement   Recent Labs: Potassium (mmol/L)  Date Value  08/10/2019 4.3   Magnesium (mg/dL)  Date Value  08/10/2019 2.4   Calcium (mg/dL)  Date Value  08/10/2019 9.1   Albumin (g/dL)  Date Value  08/07/2019 2.9 (L)  11/18/2015 4.1   Phosphorus (mg/dL)  Date Value  08/10/2019 3.5   Sodium (mmol/L)  Date Value  08/10/2019 135  10/07/2018 142   Corrected Ca:   Assessment: 73 y.o. female,  w hypertension, hyperlipidemia, DM2, ESRD on PD, PAD s/p bilateral BKA, CAD, CHF (EF 45%), Chronic Afib, CLL presents with c/o weakness related to hypotension. She was on torsemide PTA which has been continued at a reduced dose of 20 mg BID. Pt is on sevelamer, nephro following patient.  Goal of Therapy:  Given cardiac history: Potassium 4.0 - 5.1 mmol/L Magnesium 2.0 - 2.4 mg/dL Other electrolytes WNL  Plan:  Patient has not required supplementation for several days. Labs not drawn this morning. Will defer lab monitoring to MD unless clinically indicated.  Tawnya Crook ,PharmD Clinical Pharmacist 08/11/2019 2:21 PM

## 2019-08-11 NOTE — Care Management Obs Status (Signed)
[facility] NOTIFICATION   Patient Details  Name: Caroline Hernandez MRN: [id number] Date of Birth: 1946/06/17   Medicare Observation Status Notification Given:  Yes    Cecil Cobbs 08/11/2019, 7:21 PM

## 2019-08-11 NOTE — Progress Notes (Addendum)
Progress Note    Caroline Hernandez  TIR:[id number] DOB: 26-Dec-1945  DOA: 08/06/2019 PCP: System, Pcp Not In      Brief Narrative:    Medical records reviewed and are as summarized below:  Caroline Hernandez is an 73 y.o. female with medical history significant for CAD, CHF, end-stage renal disease, recurrent UTIs.  She was brought to the hospital because of fatigue, generalized weakness and inability to transfer.      Assessment/Plan:   Principal Problem:   Generalized weakness Active Problems:   ESRD (end stage renal disease) (HCC)   Acute lower UTI   Hypotension   Body mass index is 42.47 kg/m.   Hypotension: Hold torsemide and monitor BP closely.  Generalized weakness/chronic fatigue/debility: PT and OT.  Follow-up with social worker to assist with disposition  ESRD: She had peritoneal dialysis overnight.  Follow-up with nephrologist.  Abnormal urinalysis: Asymptomatic.  She was on ciprofloxacin prior to admission and she got 2 doses of IV Rocephin in the hospital.  Chronic atrial fibrillation with slow ventricular response/bradycardia: Heart rate has mainly been in the 50s that has occasionally dropped into the 40s.  It appears patient is asymptomatic from this.  Continue to monitor heart rate closely.  Continue Eliquis.  Chronic systolic/diastolic CHF with EF of 67% on 2D echo in October 2020: Hold torsemide because of hypotension.  IDDM: Continue Lantus.  CAD: She is on Plavix and Lipitor  She has chronic leukocytosis   Family Communication/Anticipated D/C date and plan/Code Status   DVT prophylaxis: Eliquis Code Status: Full code Family Communication: Plan discussed with the patient Disposition Plan: Possible discharge to SNF in 1 to 2 days      Subjective:   She still feels very weak.  Her blood pressure dropped into the 90s this morning and she said that is unusually low for her.  Objective:    Vitals:   08/10/19 1519 08/10/19 1934 08/11/19  0447 08/11/19 0748  BP: (!) 110/51 (!) 104/48 (!) 91/45 (!) 100/53  Pulse: (!) 49 (!) 52 (!) 53 (!) 46  Resp: 18 18 20 18   Temp: 97.7 F (36.5 C) 97.9 F (36.6 C) 98.3 F (36.8 C) (!) 97.5 F (36.4 C)  TempSrc: Oral Oral Oral Oral  SpO2: 99% 100% 95% 100%  Weight:   105.3 kg   Height:        Intake/Output Summary (Last 24 hours) at 08/11/2019 1237 Last data filed at 08/11/2019 0900 Gross per 24 hour  Intake 240 ml  Output 1604 ml  Net -1364 ml   Filed Weights   08/09/19 0500 08/10/19 0514 08/11/19 0447  Weight: 106.8 kg 107 kg 105.3 kg    Exam:  GEN: NAD SKIN: PD catheter in left upper quadrant.  PermCath on right upper chest wall EYES: No pallor or icterus ENT: MMM CV: RRR PULM: CTA B ABD: soft, obese, NT, +BS CNS: AAO x 3, non focal EXT: Bilateral BKA    Data Reviewed:   I have personally reviewed following labs and imaging studies:  Labs: Labs show the following:   Basic Metabolic Panel: Recent Labs  Lab 08/06/19 1629 08/07/19 0446 08/07/19 1013 08/08/19 0614 08/10/19 0521  NA 134* 135  --  135 135  K 2.8* 3.1*  --  4.3 4.3  CL 97* 99  --  99 99  CO2 25 25  --  25 26  GLUCOSE 207* 146*  --  133* 188*  BUN 9 12  --  20 29*  CREATININE 2.96* 3.62*  --  4.74* 5.68*  CALCIUM 8.0* 8.2*  --  8.6* 9.1  MG  --   --  1.8 2.3 2.4  PHOS  --   --   --   --  3.5   GFR Estimated Creatinine Clearance: 10.4 mL/min (A) (by C-G formula based on SCr of 5.68 mg/dL (H)). Liver Function Tests: Recent Labs  Lab 08/07/19 0446  AST 18  ALT 8  ALKPHOS 72  BILITOT 0.9  PROT 5.3*  ALBUMIN 2.9*   No results for input(s): LIPASE, AMYLASE in the last 168 hours. No results for input(s): AMMONIA in the last 168 hours. Coagulation profile No results for input(s): INR, PROTIME in the last 168 hours.  CBC: Recent Labs  Lab 08/06/19 1629 08/07/19 0446 08/10/19 0521  WBC 25.5* 23.8* 24.6*  HGB 10.4* 9.4* 9.6*  HCT 34.4* 28.8* 32.2*  MCV 91.5 85.5 92.3  PLT  182 165 173   Cardiac Enzymes: No results for input(s): CKTOTAL, CKMB, CKMBINDEX, TROPONINI in the last 168 hours. BNP (last 3 results) No results for input(s): PROBNP in the last 8760 hours. CBG: Recent Labs  Lab 08/10/19 1120 08/10/19 1639 08/10/19 2103 08/11/19 0752 08/11/19 1154  GLUCAP 124* 141* 203* 162* 164*   D-Dimer: No results for input(s): DDIMER in the last 72 hours. Hgb A1c: No results for input(s): HGBA1C in the last 72 hours. Lipid Profile: No results for input(s): CHOL, HDL, LDLCALC, TRIG, CHOLHDL, LDLDIRECT in the last 72 hours. Thyroid function studies: No results for input(s): TSH, T4TOTAL, T3FREE, THYROIDAB in the last 72 hours.  Invalid input(s): FREET3 Anemia work up: No results for input(s): VITAMINB12, FOLATE, FERRITIN, TIBC, IRON, RETICCTPCT in the last 72 hours. Sepsis Labs: Recent Labs  Lab 08/06/19 1629 08/06/19 2344 08/07/19 0446 08/10/19 0521  WBC 25.5*  --  23.8* 24.6*  LATICACIDVEN  --  1.2  --   --     Microbiology Recent Results (from the past 240 hour(s))  Urine Culture     Status: Abnormal   Collection Time: 08/06/19 10:41 PM   Specimen: Urine, Catheterized  Result Value Ref Range Status   Specimen Description   Final    URINE, CATHETERIZED Performed at [facility], [address], [place], [place] [address]    Special Requests   Final    NONE Performed at [facility], [address] [place], [place] [address]    Culture MULTIPLE SPECIES PRESENT, SUGGEST RECOLLECTION (A)  Final   Report Status 08/08/2019 FINAL  Final  SARS CORONAVIRUS 2 (TAT 6-24 HRS) Nasopharyngeal Nasopharyngeal Swab     Status: None   Collection Time: 08/07/19  1:13 AM   Specimen: Nasopharyngeal Swab  Result Value Ref Range Status   SARS Coronavirus 2 NEGATIVE NEGATIVE Final    Comment: (NOTE) SARS-CoV-2 target nucleic acids are NOT DETECTED. The SARS-CoV-2 RNA is generally detectable in upper and lower respiratory specimens  during the acute phase of infection. Negative results do not preclude SARS-CoV-2 infection, do not rule out co-infections with other pathogens, and should not be used as the sole basis for treatment or other patient management decisions. Negative results must be combined with clinical observations, patient history, and epidemiological information. The expected result is Negative. Fact Sheet for Patients: SugarRoll.be Fact Sheet for Healthcare Providers: https://www.woods-mathews.com/ This test is not yet approved or cleared by the [place] FDA and  has been authorized for detection and/or diagnosis of SARS-CoV-2 by FDA under  an Emergency Use Authorization (EUA). This EUA will remain  in effect (meaning this test can be used) for the duration of the COVID-19 declaration under Section 56 4(b)(1) of the Act, 21 U.S.C. section 360bbb-3(b)(1), unless the authorization is terminated or revoked sooner. Performed at [facility], [place] [address] [place], [place] [address]   MRSA PCR Screening     Status: Abnormal   Collection Time: 08/07/19  4:12 AM   Specimen: Nasal Mucosa; Nasopharyngeal  Result Value Ref Range Status   MRSA by PCR POSITIVE (A) NEGATIVE Final    Comment:        The GeneXpert MRSA Assay (FDA approved for NASAL specimens only), is one component of a comprehensive MRSA colonization surveillance program. It is not intended to diagnose MRSA infection nor to guide or monitor treatment for MRSA infections. RESULT CALLED TO, READ BACK BY AND VERIFIED WITH: Corine Shelter RN [id number] 08/07/2019 HNM Performed at [facility], [address] [place], [place] [address]     Procedures and diagnostic studies:  No results found.  Medications:   . amitriptyline  25 mg Oral QHS  . apixaban  5 mg Oral BID  . atorvastatin  40 mg Oral q1800  . clopidogrel  75 mg Oral Daily  . gabapentin  100 mg Oral BID  .  gentamicin cream  1 application Topical Daily  . insulin aspart  0-5 Units Subcutaneous QHS  . insulin aspart  0-6 Units Subcutaneous TID WC  . insulin glargine  12 Units Subcutaneous Daily  . iron polysaccharides  150 mg Oral Daily  . isosorbide mononitrate  30 mg Oral Daily  . Melatonin  5 mg Oral QHS  . mupirocin ointment  1 application Nasal BID  . pantoprazole  40 mg Oral QHS  . sertraline  100 mg Oral QHS  . sevelamer carbonate  2,400 mg Oral TID WC  . sodium chloride flush  3 mL Intravenous Once  . sodium chloride flush  3 mL Intravenous Q12H  . torsemide  20 mg Oral BID   Continuous Infusions: . sodium chloride Stopped (08/08/19 0508)     LOS: 4 days   Sam Wunschel  [facility]   *Please refer to [email or web address], password [email or web address] to get updated schedule on who will round on this patient, as hospitalists switch teams weekly. If 7PM-7AM, please contact night-coverage at [email or web address], password [email or web address] for any overnight needs.  08/11/2019, 12:37 PM

## 2019-08-11 NOTE — Progress Notes (Signed)
Physical Therapy Treatment Patient Details Name: Caroline Hernandez MRN: [id number] DOB: Jun 10, 1946 Today's Date: 08/11/2019    History of Present Illness 73 y.o. female, w/ hypertension, hyperlipidemia, Dm2, ESRD on PD, PAD s/p bilateral BKA, CAD, CHF (EF 45%), Chronic Afib, CLL presents with c/o weakness.  Pt recent admitted on 06/25/19 for diarrhea (negative C. Diff), pt denies dysuria, n/v, fever, chills, diarrhea, brbpr, black stool. She recently was at [facility] for short term rehab and was discharged and returned to the hospital in the same day with weakness and inability to get out of the car after dialysis.    PT Comments    Pt in bed upon arrival stating she was sleepy but agreeable and motivated for PT. Pt reported multiple times that she wants to get better so she can return home with her daughter who recently lost her husband and now lives alone. Pt performed supine and seated LE therex with min cuing for correct performance. Pt encouraged to perform LE therex throughout her day to improve LE strength with pt verbalizing understanding and stating she would. Pt required max A to get EOB this session. Pt tolerated sitting EOB ~15 min on RA with SpO2 monitored frequently and 94% + t/o. Worked on sitting balance, reaching with single UE to reach outside of BOS in order to touch therapist hand. Pt also worked on sitting balance and core stability with perturbation training against mild to mod perturbations from therapist as well as matching resistance provided from therapist in all planes of motion. Pt performed UE strengthening with single arm chest press in sitting, bicep curls and tricep extensions in supine all against therapist resistance for improved upper body strength for carryover to improved transfers with transfer board. Pt presents with strength, balance and activity tolerance deficits limiting functional mobility. Pt is extremely motivated to improve deficits and functional mobility. Pt will  benefit from cont skilled acute PT to improve deficits to improve independence with mobility.    Follow Up Recommendations  SNF;Supervision/Assistance - 24 hour     Equipment Recommendations  Other (comment)(TBD next venue, possibly hoyer depending on progress with transfers)    Recommendations for Other Services       Precautions / Restrictions Precautions Precautions: Fall Restrictions Weight Bearing Restrictions: No    Mobility  Bed Mobility Overal bed mobility: Needs Assistance Bed Mobility: Supine to Sit;Sit to Supine     Supine to sit: Max assist;HOB elevated Sit to supine: HOB elevated;Mod assist   General bed mobility comments: max A to get EOB needing assist for LE advancement and trunk elevation, cuing for technique, pt utilized bed rails as well, mod A to return to supine  Transfers                 General transfer comment: pt deferred OOB mobility wanting to focus this session on sitting balance and strengthening  Ambulation/Gait                 Stairs             Wheelchair Mobility    Modified Rankin (Stroke Patients Only)       Balance Overall balance assessment: Needs assistance Sitting-balance support: Single extremity supported Sitting balance-Leahy Scale: Fair Sitting balance - Comments: pt able to maintain balance EOB with single UE support, worked on reaching outside BOS with one UE this session  Cognition Arousal/Alertness: Awake/alert Behavior During Therapy: WFL for tasks assessed/performed Overall Cognitive Status: Within Functional Limits for tasks assessed                                        Exercises Total Joint Exercises Hip ABduction/ADduction: AROM;Both;20 reps Straight Leg Raises: AROM;Both;20 reps Marching in Standing: AROM;Both;20 reps;Seated Other Exercises Other Exercises: single arm chest press sitting EOB against therapist  resistance x10 each arm Other Exercises: single arm bicep curl and tricep extension in supine against therapist resistance x10each arm for both bicep curls and tricep extension Other Exercises: sitting balance perturbations with therapist providing light perturbations with cuing "dont let me move you" in all planes and also asking pt "match my resistance" also working in all planes of motion    General Comments        Pertinent Vitals/Pain Pain Assessment: No/denies pain    Home Living                      Prior Function            PT Goals (current goals can now be found in the care plan section) Progress towards PT goals: Progressing toward goals    Frequency    Min 2X/week      PT Plan Current plan remains appropriate    Co-evaluation              AM-PAC PT "6 Clicks" Mobility   Outcome Measure  Help needed turning from your back to your side while in a flat bed without using bedrails?: A Little Help needed moving from lying on your back to sitting on the side of a flat bed without using bedrails?: A Lot Help needed moving to and from a bed to a chair (including a wheelchair)?: A Lot Help needed standing up from a chair using your arms (e.g., wheelchair or bedside chair)?: Total Help needed to walk in hospital room?: Total Help needed climbing 3-5 steps with a railing? : Total 6 Click Score: 10    End of Session Equipment Utilized During Treatment: Gait belt;Oxygen Activity Tolerance: Patient tolerated treatment well Patient left: in bed;with call bell/phone within reach;with bed alarm set Nurse Communication: Mobility status PT Visit Diagnosis: Muscle weakness (generalized) (M62.81)     Time: 8916-9450 PT Time Calculation (min) (ACUTE ONLY): 28 min  Charges:  $Therapeutic Activity: 23-37 mins                     Zachary George PT, DPT 4:28 PM,08/11/19 [phone]   Deneise Getty Drucilla Chalet 08/11/2019, 4:18 PM

## 2019-08-11 NOTE — TOC Progression Note (Signed)
Transition of Care [facility]) - Progression Note    Patient Details  Name: Caroline Hernandez MRN: [id number] Date of Birth: May 28, 1946  Transition of Care [facility]) CM/SW Contact  Ross Ludwig, [place] Phone Number: 08/11/2019, 7:16 PM  Clinical Narrative:    CSW spoke to April at [facility], they said they never received faxed clinicals from Friday, CSW refaxed clinical information.  Awaiting for insurance authorization for SNF.   Expected Discharge Plan: Skilled Nursing Facility Barriers to Discharge: Ship broker, Continued Medical Work up  Expected Discharge Plan and Services Expected Discharge Plan: [place] Choice: [place] arrangements for the past 2 months: [place], [place]                                       Social Determinants of Health (SDOH) Interventions    Readmission Risk Interventions Readmission Risk Prevention Plan 06/27/2019 06/27/2019 02/18/2019  Transportation Screening - Complete Complete  Medication Review Press photographer) - Complete -  PCP or Specialist appointment within 3-5 days of discharge - Complete Complete  HRI or [place] - Complete (No Data)  SW Recovery Care/Counseling Consult - Complete (No Data)  Palliative Care Screening - Complete Not [place] Not Applicable Not Complete Not Applicable  SNF Comments - pending PT eval -  Some recent data might be hidden

## 2019-08-11 NOTE — Progress Notes (Signed)
Pd completed 

## 2019-08-12 DIAGNOSIS — I959 Hypotension, unspecified: Secondary | ICD-10-CM | POA: Diagnosis not present

## 2019-08-12 DIAGNOSIS — R531 Weakness: Secondary | ICD-10-CM | POA: Diagnosis not present

## 2019-08-12 DIAGNOSIS — N186 End stage renal disease: Secondary | ICD-10-CM | POA: Diagnosis not present

## 2019-08-12 LAB — GLUCOSE, CAPILLARY
Glucose-Capillary: 126 mg/dL — ABNORMAL HIGH (ref 70–99)
Glucose-Capillary: 123 mg/dL — ABNORMAL HIGH (ref 70–99)
Glucose-Capillary: 125 mg/dL — ABNORMAL HIGH (ref 70–99)
Glucose-Capillary: 118 mg/dL — ABNORMAL HIGH (ref 70–99)

## 2019-08-12 NOTE — Progress Notes (Signed)
[facility]  ROUNDING NOTE   Subjective:   Peritoneal dialysis last night. Tolerated treatment well.   Objective:  Vital signs in last 24 hours:  Temp:  [97.7 F (36.5 C)-98.2 F (36.8 C)] 98 F (36.7 C) (12/01 0742) Pulse Rate:  [42-59] 42 (12/01 0742) Resp:  [18-20] 18 (12/01 0742) BP: (97-106)/(45-70) 101/47 (12/01 0742) SpO2:  [93 %-99 %] 99 % (12/01 0742) Weight:  [104.3 kg] 104.3 kg (12/01 0451)  Weight change: -1.025 kg Filed Weights   08/10/19 0514 08/11/19 0447 08/12/19 0451  Weight: 107 kg 105.3 kg 104.3 kg    Intake/Output: I/O last 3 completed shifts: In: 720 [P.O.:720] Out: 3638 [Other:3638]   Intake/Output this shift:  Total I/O In: -  Out: 2234 [Urine:200; ZOXWR:6045]  Physical Exam: General: No acute distress  Head: Normocephalic, atraumatic. Moist oral mucosal membranes  Eyes: Anicteric  Neck: Supple, trachea midline  Lungs:  Clear to auscultation, normal effort  Heart: S1S2 no rubs  Abdomen:  Soft, nontender, bowel sounds present  Extremities: B/L LE amputations, trace edema in limbs  Neurologic: Awake, alert, following commands  Skin: No lesions  Access: PD catheter and IJ PC in place    Basic Metabolic Panel: Recent Labs  Lab 08/06/19 1629 08/07/19 0446 08/07/19 1013 08/08/19 0614 08/10/19 0521  NA 134* 135  --  135 135  K 2.8* 3.1*  --  4.3 4.3  CL 97* 99  --  99 99  CO2 25 25  --  25 26  GLUCOSE 207* 146*  --  133* 188*  BUN 9 12  --  20 29*  CREATININE 2.96* 3.62*  --  4.74* 5.68*  CALCIUM 8.0* 8.2*  --  8.6* 9.1  MG  --   --  1.8 2.3 2.4  PHOS  --   --   --   --  3.5    Liver Function Tests: Recent Labs  Lab 08/07/19 0446  AST 18  ALT 8  ALKPHOS 72  BILITOT 0.9  PROT 5.3*  ALBUMIN 2.9*   No results for input(s): LIPASE, AMYLASE in the last 168 hours. No results for input(s): AMMONIA in the last 168 hours.  CBC: Recent Labs  Lab 08/06/19 1629 08/07/19 0446 08/10/19 0521  WBC 25.5* 23.8* 24.6*   HGB 10.4* 9.4* 9.6*  HCT 34.4* 28.8* 32.2*  MCV 91.5 85.5 92.3  PLT 182 165 173    Cardiac Enzymes: No results for input(s): CKTOTAL, CKMB, CKMBINDEX, TROPONINI in the last 168 hours.  BNP: Invalid input(s): POCBNP  CBG: Recent Labs  Lab 08/11/19 1154 08/11/19 1646 08/11/19 2105 08/12/19 0743 08/12/19 1134  GLUCAP 164* 127* 198* 126* 86*    Microbiology: Results for orders placed or performed during the hospital encounter of 08/06/19  Urine Culture     Status: Abnormal   Collection Time: 08/06/19 10:41 PM   Specimen: Urine, Catheterized  Result Value Ref Range Status   Specimen Description   Final    URINE, CATHETERIZED Performed at [facility], [address], [place], [place] [address]    Special Requests   Final    NONE Performed at [facility], [address] [place], [place] [address]    Culture MULTIPLE SPECIES PRESENT, SUGGEST RECOLLECTION (A)  Final   Report Status 08/08/2019 FINAL  Final  SARS CORONAVIRUS 2 (TAT 6-24 HRS) Nasopharyngeal Nasopharyngeal Swab     Status: None   Collection Time: 08/07/19  1:13 AM   Specimen: Nasopharyngeal Swab  Result Value Ref Range Status   SARS Coronavirus 2 NEGATIVE NEGATIVE Final    Comment: (NOTE) SARS-CoV-2 target nucleic acids are NOT DETECTED. The SARS-CoV-2 RNA is generally detectable in upper and lower respiratory specimens during the acute phase of infection. Negative results do not preclude SARS-CoV-2 infection, do not rule out co-infections with other pathogens, and should not be used as the sole basis for treatment or other patient management decisions. Negative results must be combined with clinical observations, patient history, and epidemiological information. The expected result is Negative. Fact Sheet for Patients: SugarRoll.be Fact Sheet for Healthcare Providers: https://www.woods-mathews.com/ This test is not yet approved or cleared  by the [place] FDA and  has been authorized for detection and/or diagnosis of SARS-CoV-2 by FDA under an Emergency Use Authorization (EUA). This EUA will remain  in effect (meaning this test can be used) for the duration of the COVID-19 declaration under Section 56 4(b)(1) of the Act, 21 U.S.C. section 360bbb-3(b)(1), unless the authorization is terminated or revoked sooner. Performed at [facility], [place] [address] [place], [place] [address]   MRSA PCR Screening     Status: Abnormal   Collection Time: 08/07/19  4:12 AM   Specimen: Nasal Mucosa; Nasopharyngeal  Result Value Ref Range Status   MRSA by PCR POSITIVE (A) NEGATIVE Final    Comment:        The GeneXpert MRSA Assay (FDA approved for NASAL specimens only), is one component of a comprehensive MRSA colonization surveillance program. It is not intended to diagnose MRSA infection nor to guide or monitor treatment for MRSA infections. RESULT CALLED TO, READ BACK BY AND VERIFIED WITH: Corine Shelter RN [id number] 08/07/2019 HNM Performed at [facility], [address] [place], [place] [address]     Coagulation Studies: No results for input(s): LABPROT, INR in the last 72 hours.  Urinalysis: No results for input(s): COLORURINE, LABSPEC, PHURINE, GLUCOSEU, HGBUR, BILIRUBINUR, KETONESUR, PROTEINUR, UROBILINOGEN, NITRITE, LEUKOCYTESUR in the last 72 hours.  Invalid input(s): APPERANCEUR    Imaging: No results found.   Medications:   . sodium chloride Stopped (08/08/19 0508)   . amitriptyline  25 mg Oral QHS  . apixaban  5 mg Oral BID  . atorvastatin  40 mg Oral q1800  . clopidogrel  75 mg Oral Daily  . docusate sodium  100 mg Oral Daily  . gabapentin  100 mg Oral BID  . gentamicin cream  1 application Topical Daily  . insulin aspart  0-5 Units Subcutaneous QHS  . insulin aspart  0-6 Units Subcutaneous TID WC  . insulin glargine  12 Units Subcutaneous Daily  . iron polysaccharides  150 mg  Oral Daily  . isosorbide mononitrate  30 mg Oral Daily  . Melatonin  5 mg Oral QHS  . pantoprazole  40 mg Oral QHS  . sertraline  100 mg Oral QHS  . sevelamer carbonate  2,400 mg Oral TID WC  . sodium chloride flush  3 mL Intravenous Once  . sodium chloride flush  3 mL Intravenous Q12H   sodium chloride, acetaminophen **OR** acetaminophen, heparin, sodium chloride flush  Assessment/ Plan:  73 y.o. [place] female with ESRD on PD, bilateral BKA, coronary artery disease status post CABG, chronic systolic heart failure, CLL, diabetes mellitus type 2, hypertension, GERD, hyperlipidemia, ischemic cardiomyopathy, peripheral arterial disease.  [facility] for [facility] Receiving back up hemo at [facility]   1.  ESRD: transition to hemodialysis as patient will need rehab on discharge.  2.  Anemia of chronic kidney disease.   - EPO   3.  Secondary hyperparathyroidism.  Phosphorus and calcium at goal.  - sevelamer  4. Hypertension: blood pressure at goal.    LOS: 4 Michel Eskelson 12/1/20201:08 PM

## 2019-08-12 NOTE — Progress Notes (Signed)
Occupational Therapy Treatment Patient Details Name: Caroline Hernandez MRN: [id number] DOB: 1946/09/06 Today's Date: 08/12/2019    History of present illness 73 y.o. female, w/ hypertension, hyperlipidemia, Dm2, ESRD on PD, PAD s/p bilateral BKA, CAD, CHF (EF 45%), Chronic Afib, CLL presents with c/o weakness.  Pt recent admitted on 06/25/19 for diarrhea (negative C. Diff), pt denies dysuria, n/v, fever, chills, diarrhea, brbpr, black stool. She recently was at [facility] for short term rehab and was discharged and returned to the hospital in the same day with weakness and inability to get out of the car after dialysis.   OT comments  Pt seen for OT tx this date. Pt eager to participate and do all she can to return to PLOF. Pt denies pain. Pt required Max A to come sit EOB and SBA to Min A to correct for sitting balance with BUE support her sitting balance. Pt reported after approx 10 min sitting EOB feeling 10/10 fatigue. VSS throughout, on supplemental O2. Pt required Max A x2 for scooting up in bed. Pt continues to benefit from skilled OT services, and would benefit from skilled OT within short term rehab setting upon discharge. Will continue to progress as able.   Follow Up Recommendations  SNF    Equipment Recommendations  Other (comment)(may require a drop arm commode if she can consistently perform sliding board transfer)    Recommendations for Other Services      Precautions / Restrictions Precautions Precautions: Fall Precaution Comments: B amputee, has prosthetics in the room but reports she has not been able to stand for more than a month. Restrictions Weight Bearing Restrictions: No Other Position/Activity Restrictions: R residual limb with distal scab/scarring, L with erythema       Mobility Bed Mobility Overal bed mobility: Needs Assistance Bed Mobility: Supine to Sit;Sit to Supine     Supine to sit: Max assist;HOB elevated Sit to supine: Mod assist;+2 for physical  assistance;Max assist   General bed mobility comments: mod A to lay back down but Max x2 for scooting up in bed for optimal positioning  Transfers                      Balance Overall balance assessment: Needs assistance Sitting-balance support: Bilateral upper extremity supported;Feet unsupported Sitting balance-Leahy Scale: Fair Sitting balance - Comments: fair-, requiring BUE support at all times with varying SBA to Min A to correct sitting balance                                   ADL either performed or assessed with clinical judgement   ADL Overall ADL's : Needs assistance/impaired                                       General ADL Comments: continues to require min-mod A for bathing and dressing, max for toileting from bed level     Vision Baseline Vision/History: Macular Degeneration Patient Visual Report: No change from baseline     Perception     Praxis      Cognition Arousal/Alertness: Awake/alert Behavior During Therapy: WFL for tasks assessed/performed Overall Cognitive Status: Within Functional Limits for tasks assessed  Exercises Other Exercises Other Exercises: sitting balance act requiring reaching in all planes and weightshifting L to R in preparation for LB ADL with pt requiring Min A at times   Shoulder Instructions       General Comments      Pertinent Vitals/ Pain       Pain Assessment: No/denies pain  Home Living                                          Prior Functioning/Environment              Frequency  Min 1X/week        Progress Toward Goals  OT Goals(current goals can now be found in the care plan section)  Progress towards OT goals: Progressing toward goals  Acute Rehab OT Goals Patient Stated Goal: Patient would like to return home with her daughter. OT Goal Formulation: With patient Time For Goal  Achievement: 08/23/19 Potential to Achieve Goals: [place] Discharge plan remains appropriate;Frequency remains appropriate    Co-evaluation                 AM-PAC OT "6 Clicks" Daily Activity     Outcome Measure   Help from another person eating meals?: None Help from another person taking care of personal grooming?: A Little Help from another person toileting, which includes using toliet, bedpan, or urinal?: A Lot Help from another person bathing (including washing, rinsing, drying)?: A Lot Help from another person to put on and taking off regular upper body clothing?: A Little Help from another person to put on and taking off regular lower body clothing?: A Lot 6 Click Score: 16    End of Session Equipment Utilized During Treatment: Oxygen  OT Visit Diagnosis: Muscle weakness (generalized) (M62.81);Low vision, both eyes (H54.2)   Activity Tolerance Patient tolerated treatment well   Patient Left in bed;with call bell/phone within reach;with bed alarm set   Nurse Communication          Time: 0981-1914 OT Time Calculation (min): 29 min  Charges: OT General Charges $OT Visit: 1 Visit OT Treatments $Self Care/Home Management : 8-22 mins $Therapeutic Activity: 8-22 mins  Jeni Salles, MPH, MS, OTR/L ascom [phone] 08/12/19, 2:43 PM

## 2019-08-12 NOTE — Progress Notes (Signed)
Progress Note    Caroline Hernandez  QZR:[id number] DOB: 02-02-1946  DOA: 08/06/2019 PCP: System, Pcp Not In      Brief Narrative:    Medical records reviewed and are as summarized below:  Caroline Hernandez is an 73 y.o. female with medical history significant for CAD, CHF, end-stage renal disease, recurrent UTIs.  She was brought to the hospital because of fatigue, generalized weakness and inability to transfer.      Assessment/Plan:   Principal Problem:   Generalized weakness Active Problems:   ESRD (end stage renal disease) (HCC)   Acute lower UTI   Hypotension   Body mass index is 42.06 kg/m.   Hypotension: Discontinue torsemide.  This was discussed with nephrologist, Dr. Juleen China  Generalized weakness/chronic fatigue/debility: PT and OT recommended rehabilitation at [facility].Marland Kitchen  Follow-up with social worker to assist with disposition  ESRD: She had peritoneal dialysis overnight and 1.8 L of fluid was removed..  Follow-up with nephrologist.  Per nephrologist, patient will transition to hemodialysis if she goes to skilled nursing facility.  Abnormal urinalysis: Asymptomatic.  She was on ciprofloxacin prior to admission and she got 2 doses of IV Rocephin in the hospital.  Chronic atrial fibrillation with slow ventricular response/bradycardia: Heart rate has mainly been in the 50s and has occasionally dropped into the 40s.  It appears patient is asymptomatic from this.  Continue to monitor heart rate closely.  Continue Eliquis.  Chronic systolic/diastolic CHF with EF of 35% on 2D echo in October 2020: Torsemide has been discontinued because of hypotension  IDDM: Continue Lantus.  CAD: She is on Plavix and Lipitor  She has chronic leukocytosis   Family Communication/Anticipated D/C date and plan/Code Status   DVT prophylaxis: Eliquis Code Status: Full code Family Communication: Plan discussed with the patient Disposition Plan: Possible discharge to SNF tomorrow pending  insurance authorization      Subjective:   She still feels very weak.  Her blood pressure dropped into the 90s this morning and she said that is unusually low for her.  Objective:    Vitals:   08/11/19 1643 08/11/19 1924 08/12/19 0451 08/12/19 0742  BP: (!) 100/45 (!) 101/45 (!) 97/57 (!) 101/47  Pulse: (!) 44 (!) 59 (!) 52 (!) 42  Resp: 20 19 20 18   Temp: 98.2 F (36.8 C) 97.7 F (36.5 C) 98.1 F (36.7 C) 98 F (36.7 C)  TempSrc: Oral Oral Oral   SpO2: 93% 98% 98% 99%  Weight:   104.3 kg   Height:        Intake/Output Summary (Last 24 hours) at 08/12/2019 1339 Last data filed at 08/12/2019 0840 Gross per 24 hour  Intake 240 ml  Output 4268 ml  Net -4028 ml   Filed Weights   08/10/19 0514 08/11/19 0447 08/12/19 0451  Weight: 107 kg 105.3 kg 104.3 kg    Exam:  GEN: NAD SKIN: No rash EYES: No abnormality noted ENT: MMM CV: RRR PULM: CTA B ABD: soft, ND, NT, +BS CNS: AAO x 3, non focal EXT: Edema of b/l thighs. B/l BKA    Data Reviewed:   I have personally reviewed following labs and imaging studies:  Labs: Labs show the following:   Basic Metabolic Panel: Recent Labs  Lab 08/06/19 1629 08/07/19 0446 08/07/19 1013 08/08/19 0614 08/10/19 0521  NA 134* 135  --  135 135  K 2.8* 3.1*  --  4.3 4.3  CL 97* 99  --  99 99  CO2 25  25  --  25 26  GLUCOSE 207* 146*  --  133* 188*  BUN 9 12  --  20 29*  CREATININE 2.96* 3.62*  --  4.74* 5.68*  CALCIUM 8.0* 8.2*  --  8.6* 9.1  MG  --   --  1.8 2.3 2.4  PHOS  --   --   --   --  3.5   GFR Estimated Creatinine Clearance: 10.3 mL/min (A) (by C-G formula based on SCr of 5.68 mg/dL (H)). Liver Function Tests: Recent Labs  Lab 08/07/19 0446  AST 18  ALT 8  ALKPHOS 72  BILITOT 0.9  PROT 5.3*  ALBUMIN 2.9*   No results for input(s): LIPASE, AMYLASE in the last 168 hours. No results for input(s): AMMONIA in the last 168 hours. Coagulation profile No results for input(s): INR, PROTIME in the last  168 hours.  CBC: Recent Labs  Lab 08/06/19 1629 08/07/19 0446 08/10/19 0521  WBC 25.5* 23.8* 24.6*  HGB 10.4* 9.4* 9.6*  HCT 34.4* 28.8* 32.2*  MCV 91.5 85.5 92.3  PLT 182 165 173   Cardiac Enzymes: No results for input(s): CKTOTAL, CKMB, CKMBINDEX, TROPONINI in the last 168 hours. BNP (last 3 results) No results for input(s): PROBNP in the last 8760 hours. CBG: Recent Labs  Lab 08/11/19 1154 08/11/19 1646 08/11/19 2105 08/12/19 0743 08/12/19 1134  GLUCAP 164* 127* 198* 126* 123*   D-Dimer: No results for input(s): DDIMER in the last 72 hours. Hgb A1c: No results for input(s): HGBA1C in the last 72 hours. Lipid Profile: No results for input(s): CHOL, HDL, LDLCALC, TRIG, CHOLHDL, LDLDIRECT in the last 72 hours. Thyroid function studies: No results for input(s): TSH, T4TOTAL, T3FREE, THYROIDAB in the last 72 hours.  Invalid input(s): FREET3 Anemia work up: No results for input(s): VITAMINB12, FOLATE, FERRITIN, TIBC, IRON, RETICCTPCT in the last 72 hours. Sepsis Labs: Recent Labs  Lab 08/06/19 1629 08/06/19 2344 08/07/19 0446 08/10/19 0521  WBC 25.5*  --  23.8* 24.6*  LATICACIDVEN  --  1.2  --   --     Microbiology Recent Results (from the past 240 hour(s))  Urine Culture     Status: Abnormal   Collection Time: 08/06/19 10:41 PM   Specimen: Urine, Catheterized  Result Value Ref Range Status   Specimen Description   Final    URINE, CATHETERIZED Performed at [facility], [address] [place], [place] [address]    Special Requests   Final    NONE Performed at [facility], [address] [place], [place] [address]    Culture MULTIPLE SPECIES PRESENT, SUGGEST RECOLLECTION (A)  Final   Report Status 08/08/2019 FINAL  Final  SARS CORONAVIRUS 2 (TAT 6-24 HRS) Nasopharyngeal Nasopharyngeal Swab     Status: None   Collection Time: 08/07/19  1:13 AM   Specimen: Nasopharyngeal Swab  Result Value Ref Range Status   SARS Coronavirus 2  NEGATIVE NEGATIVE Final    Comment: (NOTE) SARS-CoV-2 target nucleic acids are NOT DETECTED. The SARS-CoV-2 RNA is generally detectable in upper and lower respiratory specimens during the acute phase of infection. Negative results do not preclude SARS-CoV-2 infection, do not rule out co-infections with other pathogens, and should not be used as the sole basis for treatment or other patient management decisions. Negative results must be combined with clinical observations, patient history, and epidemiological information. The expected result is Negative. Fact Sheet for Patients: SugarRoll.be Fact Sheet for Healthcare Providers: https://www.woods-mathews.com/ This test is not yet  approved or cleared by the [place] and  has been authorized for detection and/or diagnosis of SARS-CoV-2 by FDA under an Emergency Use Authorization (EUA). This EUA will remain  in effect (meaning this test can be used) for the duration of the COVID-19 declaration under Section 56 4(b)(1) of the Act, 21 U.S.C. section 360bbb-3(b)(1), unless the authorization is terminated or revoked sooner. Performed at [facility], [place] [address], [place], [place] [address]   MRSA PCR Screening     Status: Abnormal   Collection Time: 08/07/19  4:12 AM   Specimen: Nasal Mucosa; Nasopharyngeal  Result Value Ref Range Status   MRSA by PCR POSITIVE (A) NEGATIVE Final    Comment:        The GeneXpert MRSA Assay (FDA approved for NASAL specimens only), is one component of a comprehensive MRSA colonization surveillance program. It is not intended to diagnose MRSA infection nor to guide or monitor treatment for MRSA infections. RESULT CALLED TO, READ BACK BY AND VERIFIED WITH: Corine Shelter RN 0383 08/07/2019 HNM Performed at [facility], [address] [place], [place] [address]     Procedures and diagnostic studies:  No results found.   Medications:   . amitriptyline  25 mg Oral QHS  . apixaban  5 mg Oral BID  . atorvastatin  40 mg Oral q1800  . clopidogrel  75 mg Oral Daily  . docusate sodium  100 mg Oral Daily  . gabapentin  100 mg Oral BID  . gentamicin cream  1 application Topical Daily  . insulin aspart  0-5 Units Subcutaneous QHS  . insulin aspart  0-6 Units Subcutaneous TID WC  . insulin glargine  12 Units Subcutaneous Daily  . iron polysaccharides  150 mg Oral Daily  . isosorbide mononitrate  30 mg Oral Daily  . Melatonin  5 mg Oral QHS  . pantoprazole  40 mg Oral QHS  . sertraline  100 mg Oral QHS  . sevelamer carbonate  2,400 mg Oral TID WC  . sodium chloride flush  3 mL Intravenous Once  . sodium chloride flush  3 mL Intravenous Q12H   Continuous Infusions: . sodium chloride Stopped (08/08/19 0508)     LOS: 4 days   Arpi Diebold  [facility]   *Please refer to [email or web address], password [email or web address] to get updated schedule on who will round on this patient, as hospitalists switch teams weekly. If 7PM-7AM, please contact night-coverage at [email or web address], password [email or web address] for any overnight needs.  08/12/2019, 1:39 PM

## 2019-08-12 NOTE — Progress Notes (Signed)
Peritoneal dialysis patient known at [facility] was recently placed at [facility] for rehab, transitioned to hemodialysis at [facility]. This was patient was released back to home and PD. Spoke with [facility] and patient can return to same chair time TTS 6:00. Please contact me about any dialysis placement concerns.  Elvera Bicker Dialysis Coordinator [phone]

## 2019-08-12 NOTE — TOC Progression Note (Addendum)
Transition of Care [facility]) - Progression Note    Patient Details  Name: Caroline Hernandez MRN: [id number] Date of Birth: January 04, 1946  Transition of Care [facility]) CM/SW Contact  Ross Ludwig, [place] Phone Number: 08/12/2019, 6:38 PM  Clinical Narrative:    CSW received phone call from patient's insurance company, they are requesting a peer to peer with attending physician.  CSW contacted attending physician with contact information from insurance company to call and complete Peer to Peer.  Insurance company requesting physician to call before Noon tomorrow to complete Peer to Peer, call [phone] then press option 5. Physician will need to give patient's date of birth [birth year], and name.  Awaiting result of Peer to Peer.    Expected Discharge Plan: Skilled Nursing Facility Barriers to Discharge: Ship broker, Continued Medical Work up  Expected Discharge Plan and Services Expected Discharge Plan: [place] Choice: [place] arrangements for the past 2 months: [place], [place]                                       Social Determinants of Health (SDOH) Interventions    Readmission Risk Interventions Readmission Risk Prevention Plan 06/27/2019 06/27/2019 02/18/2019  Transportation Screening - Complete Complete  Medication Review Press photographer) - Complete -  PCP or Specialist appointment within 3-5 days of discharge - Complete Complete  HRI or [place] - Complete (No Data)  SW Recovery Care/Counseling Consult - Complete (No Data)  Palliative Care Screening - Complete Not [place] Not Applicable Not Complete Not Applicable  SNF Comments - pending PT eval -  Some recent data might be hidden

## 2019-08-13 DIAGNOSIS — R531 Weakness: Secondary | ICD-10-CM | POA: Diagnosis not present

## 2019-08-13 LAB — GLUCOSE, CAPILLARY
Glucose-Capillary: 110 mg/dL — ABNORMAL HIGH (ref 70–99)
Glucose-Capillary: 87 mg/dL (ref 70–99)
Glucose-Capillary: 142 mg/dL — ABNORMAL HIGH (ref 70–99)
Glucose-Capillary: 191 mg/dL — ABNORMAL HIGH (ref 70–99)

## 2019-08-13 LAB — RENAL FUNCTION PANEL
Albumin: 3.1 g/dL — ABNORMAL LOW (ref 3.5–5.0)
Anion gap: 12 (ref 5–15)
BUN: 54 mg/dL — ABNORMAL HIGH (ref 8–23)
CO2: 24 mmol/L (ref 22–32)
Calcium: 8.8 mg/dL — ABNORMAL LOW (ref 8.9–10.3)
Chloride: 96 mmol/L — ABNORMAL LOW (ref 98–111)
Creatinine, Ser: 7.04 mg/dL — ABNORMAL HIGH (ref 0.44–1.00)
GFR calc Af Amer: 6 mL/min — ABNORMAL LOW (ref >60)
GFR calc non Af Amer: 5 mL/min — ABNORMAL LOW (ref >60)
Glucose, Bld: 124 mg/dL — ABNORMAL HIGH (ref 70–99)
Phosphorus: 3.2 mg/dL (ref 2.5–4.6)
Potassium: 4.6 mmol/L (ref 3.5–5.1)
Sodium: 132 mmol/L — ABNORMAL LOW (ref 135–145)

## 2019-08-13 LAB — CBC
HCT: 30.9 % — ABNORMAL LOW (ref 36.0–46.0)
Hemoglobin: 9.7 g/dL — ABNORMAL LOW (ref 12.0–15.0)
MCH: 27.3 pg (ref 26.0–34.0)
MCHC: 31.4 g/dL (ref 30.0–36.0)
MCV: 87 fL (ref 80.0–100.0)
Platelets: 153 10*3/uL (ref 150–400)
RBC: 3.55 MIL/uL — ABNORMAL LOW (ref 3.87–5.11)
RDW: 17.1 % — ABNORMAL HIGH (ref 11.5–15.5)
WBC: 29.6 10*3/uL — ABNORMAL HIGH (ref 4.0–10.5)
nRBC: 0 % (ref 0.0–0.2)

## 2019-08-13 LAB — HEPATITIS B SURFACE ANTIGEN: Hepatitis B Surface Ag: NONREACTIVE

## 2019-08-13 MED ORDER — EPOETIN ALFA 10000 UNIT/ML IJ SOLN
10000.0000 [IU] | INTRAMUSCULAR | Status: DC
  Administered 2019-08-14: 10000 [IU] via INTRAVENOUS

## 2019-08-13 NOTE — Progress Notes (Signed)
PT Cancellation Note  Patient Details Name: Lessa Huge MRN: [id number] DOB: October 12, 1945   Cancelled Treatment:    Reason Eval/Treat Not Completed: Patient at procedure or test/unavailable(Chart reviewed for attempted treatment.  Patient currently off unit for dialysis.  Will re-attempt at later time/date as medically appropriate and available.)   Berneda Piccininni H. Owens Shark, PT, DPT, NCS 08/13/19, 8:41 AM [phone]

## 2019-08-13 NOTE — Consult Note (Signed)
[place] for Electrolyte Monitoring and Replacement   Recent Labs: Potassium (mmol/L)  Date Value  08/13/2019 4.6   Magnesium (mg/dL)  Date Value  08/10/2019 2.4   Calcium (mg/dL)  Date Value  08/13/2019 8.8 (L)   Albumin (g/dL)  Date Value  08/13/2019 3.1 (L)  11/18/2015 4.1   Phosphorus (mg/dL)  Date Value  08/13/2019 3.2   Sodium (mmol/L)  Date Value  08/13/2019 132 (L)  10/07/2018 142   Corrected Ca:   Assessment: 73 y.o. female,  w hypertension, hyperlipidemia, DM2, ESRD on PD, PAD s/p bilateral BKA, CAD, CHF (EF 45%), Chronic Afib, CLL presents with c/o weakness related to hypotension. She was on torsemide PTA which has been continued at a reduced dose of 20 mg BID. Pt is on sevelamer, nephro following patient.  Goal of Therapy:  Given cardiac history: Potassium 4.0 - 5.1 mmol/L Magnesium 2.0 - 2.4 mg/dL Other electrolytes WNL  Plan:  Patient has not required supplementation for several days. Followed by Nephrology. Will defer electrolyte monitoring/replacement and sign off of pharmacy consult.  Tawnya Crook ,PharmD Clinical Pharmacist 08/13/2019 10:42 AM

## 2019-08-13 NOTE — Progress Notes (Signed)
Post HD Assessment    08/13/19 1121  Neurological  Level of Consciousness Alert  Orientation Level Oriented X4  Respiratory  Respiratory Pattern Regular;Unlabored  Chest Assessment Chest expansion symmetrical  Bilateral Breath Sounds Diminished  Cough None  Cardiac  Pulse Irregular  Heart Sounds S1, S2  ECG Monitor Yes  Cardiac Rhythm Atrial fibrillation  Vascular  R Radial Pulse +2  L Radial Pulse +2  Edema Generalized  Psychosocial  Psychosocial (WDL) WDL

## 2019-08-13 NOTE — Progress Notes (Signed)
Pre HD Tx    08/13/19 0802  Vital Signs  Temp 97.7 F (36.5 C)  Temp Source Oral  Pulse Rate (!) 55  Pulse Rate Source Monitor  Resp 16  BP (!) 126/55  BP Location Left Arm  BP Method Automatic  Patient Position (if appropriate) Lying  Oxygen Therapy  SpO2 100 %  O2 Device Room Air  Pulse Oximetry Type Continuous  Pain Assessment  Pain Scale 0-10  Pain Score 0  Dialysis Weight  Weight 104.9 kg  Type of Weight Pre-Dialysis  Time-Out for Hemodialysis  What Procedure? HD  Pt Identifiers(min of two) First/Last Name;MRN/Account#  Correct Site? Yes  Correct Side? Yes  Correct Procedure? Yes  Consents Verified? Yes  Rad Studies Available? N/A  Safety Precautions Reviewed? Yes  Engineer, civil (consulting) Number 3  Station Number 1  UF/Alarm Test Passed  Conductivity: Meter 13.8  Conductivity: Machine  13.9  pH 7.2  Reverse Osmosis Main  Normal Saline Lot Number [id number]  Dialyzer Lot Number [id number]  Disposable Set Lot Number [id number]  Machine Temperature 98.6 F (37 C)  Musician and Audible Yes  Blood Lines Intact and Secured Yes  Pre Treatment Patient Checks  Vascular access used during treatment Catheter  HD catheter dressing before treatment WDL  Hepatitis B Surface Antigen Results Pending  Isolation Initiated Yes  Date Hepatitis B Surface Antibody Drawn 08/13/19  Hemodialysis Consent Verified Yes  Hemodialysis Standing Orders Initiated Yes  ECG (Telemetry) Monitor On Yes  Prime Ordered Normal Saline  Length of  DialysisTreatment -hour(s) 3 Hour(s)  Dialysis Treatment Comments Na 140  Dialyzer Elisio 17H NR  Dialysate 3K  Dialysis Anticoagulant None  Dialysate Flow Ordered 600  Blood Flow Rate Ordered 300 mL/min  Ultrafiltration Goal 1.5 Liters  Pre Treatment Labs Renal panel;CBC;Hepatitis B Surface Antigen  Dialysis Blood Pressure Support Ordered Normal Saline  Education / Care Plan  Dialysis Education Provided Yes  Documented Education in Care  Plan Yes  Hemodialysis Catheter Right Internal jugular Double lumen Permanent (Tunneled)  Placement Date/Time: 06/30/19 1334   Time Out: Correct patient;Correct site;Correct procedure  Maximum sterile barrier precautions: Hand hygiene;Cap;Mask;Sterile gown;Large sterile sheet;Sterile gloves  Site Prep: Chlorhexidine (preferred);Skin Prep C...  Site Condition No complications  Dressing Change Due 08/20/19

## 2019-08-13 NOTE — Progress Notes (Signed)
Pre HD Assessment    08/13/19 0800  Neurological  Level of Consciousness Alert  Orientation Level Oriented X4  Respiratory  Respiratory Pattern Regular;Unlabored  Chest Assessment Chest expansion symmetrical  Bilateral Breath Sounds Diminished  Cough None  Cardiac  Pulse Irregular  Heart Sounds S1, S2  ECG Monitor Yes  Cardiac Rhythm Atrial fibrillation  Vascular  R Radial Pulse +2  L Radial Pulse +2  Psychosocial  Psychosocial (WDL) WDL

## 2019-08-13 NOTE — TOC Progression Note (Signed)
Transition of Care [facility]) - Progression Note    Patient Details  Name: Sharmel Ballantine MRN: [id number] Date of Birth: 25-Sep-1945  Transition of Care [facility]) CM/SW Contact  Ross Ludwig, [place] Phone Number: 08/13/2019, 6:14 PM  Clinical Narrative:    CSW received phone call from [facility] that patient has been approved for 5 days for short term rehab.  Patient is in copay days, and will have to pay $2400.  CSW provided patient with choice she said she would prefer [facility], however they will need the payment up front, verse going to [facility] they can work out a payment plan for patient.  CSW was talking to patient about cost, and she wants to speak to her daughter about it and she will let CSW know in the morning.  CSW informed patient that she will have to decide for tomorrow, so she can discharge once dialysis is completed and her covid results come back.  CSW to follow up with patient in the morning.   Expected Discharge Plan: Skilled Nursing Facility Barriers to Discharge: Ship broker, Continued Medical Work up  Expected Discharge Plan and Services Expected Discharge Plan: [place] Choice: [place] arrangements for the past 2 months: Single Family Home, [place] Arranged: NA           Social Determinants of Health (SDOH) Interventions    Readmission Risk Interventions Readmission Risk Prevention Plan 06/27/2019 06/27/2019 02/18/2019  Transportation Screening - Complete Complete  Medication Review Press photographer) - Complete -  PCP or Specialist appointment within 3-5 days of discharge - Complete Complete  HRI or [place] - Complete (No Data)  SW Recovery Care/Counseling Consult - Complete (No Data)  Palliative Care Screening - Complete Not [place] Not Applicable Not Complete Not Applicable  SNF Comments  - pending PT eval -  Some recent data might be hidden

## 2019-08-13 NOTE — Progress Notes (Signed)
Progress Note    Caroline Hernandez  ZTI:[id number] DOB: 1946-03-19  DOA: 08/06/2019 PCP: System, Pcp Not In      Brief Narrative:    Medical records reviewed and are as summarized below:  Caroline Hernandez is an 73 y.o. female with medical history significant for CAD, CHF, end-stage renal disease, recurrent UTIs.  She was brought to the hospital because of fatigue, generalized weakness and inability to transfer.      Assessment/Plan:   Principal Problem:   Generalized weakness Active Problems:   ESRD (end stage renal disease) (HCC)   Acute lower UTI   Hypotension   Body mass index is 41.35 kg/m.   Hypotension: Discontinue torsemide.  This was discussed with nephrologist, Dr. Juleen China  Generalized weakness/chronic fatigue/debility: PT and OT recommended rehabilitation at [facility].Marland Kitchen  Follow-up with social worker to assist with disposition  ESRD: Continue hemodialysis.  Nephrology recommends TTS schedule for now.  Once her deconditioning improves patient is willing to transition back to peritoneal dialysis if possible.  Abnormal urinalysis: Asymptomatic.  She was on ciprofloxacin prior to admission and she got 2 doses of IV Rocephin in the hospital.  Chronic atrial fibrillation with slow ventricular response/bradycardia: Heart rate has mainly been in the 50s and has occasionally dropped into the 40s.  It appears patient is asymptomatic from this.  Continue to monitor heart rate closely.  Continue Eliquis.  Chronic systolic/diastolic CHF with EF of 82% on 2D echo in October 2020: Torsemide has been discontinued because of hypotension  IDDM: Continue Lantus.  CAD: She is on Plavix and Lipitor  She has chronic leukocytosis  Physical deconditioning. The patient at baseline he is able to transfer from bed to chair as well as mobilize along with the chair. Currently even with the maximum assistance the patient is unable to transfer from bed to recliner. Patient is highly  motivated to physical therapy and requires aggressive physical therapy. Concern is that without aggressive physical therapy patient's outcome will be poor and her care requirement will only increase. Patient is highly motivated to be discharged home once her deconditioning improves.    Family Communication/Anticipated D/C date and plan/Code Status   DVT prophylaxis: Eliquis Code Status: Full code Family Communication: Plan discussed with the patient Disposition Plan: Possible discharge to SNF tomorrow pending insurance authorization      Subjective:   Continues to have weakness.  No nausea no vomiting no chest pain no abdominal pain.  No diarrhea no constipation.  No shoulder pains as well.  Objective:    Vitals:   08/13/19 1118 08/13/19 1143 08/13/19 1230 08/13/19 1434  BP: (!) 113/52   (!) 102/43  Pulse: (!) 46  65 (!) 52  Resp: (!) 26  18 16   Temp: 97.8 F (36.6 C)   97.9 F (36.6 C)  TempSrc: Oral   Oral  SpO2: 100%   100%  Weight: 102.5 kg 102.6 kg    Height:        Intake/Output Summary (Last 24 hours) at 08/13/2019 1915 Last data filed at 08/13/2019 1118 Gross per 24 hour  Intake 0 ml  Output 2004 ml  Net -2004 ml   Filed Weights   08/13/19 0802 08/13/19 1118 08/13/19 1143  Weight: 104.9 kg 102.5 kg 102.6 kg    Exam:  GEN: NAD SKIN: No rash EYES: No abnormality noted ENT: MMM CV: RRR PULM: CTA B ABD: soft, ND, NT, +BS CNS: AAO x 3, non focal EXT: Edema of b/l thighs. B/l  BKA    Data Reviewed:   I have personally reviewed following labs and imaging studies:  Labs: Labs show the following:   Basic Metabolic Panel: Recent Labs  Lab 08/07/19 0446 08/07/19 1013 08/08/19 0614 08/10/19 0521 08/13/19 0148  NA 135  --  135 135 132*  K 3.1*  --  4.3 4.3 4.6  CL 99  --  99 99 96*  CO2 25  --  25 26 24   GLUCOSE 146*  --  133* 188* 124*  BUN 12  --  20 29* 54*  CREATININE 3.62*  --  4.74* 5.68* 7.04*  CALCIUM 8.2*  --  8.6* 9.1 8.8*  MG   --  1.8 2.3 2.4  --   PHOS  --   --   --  3.5 3.2   GFR Estimated Creatinine Clearance: 8.2 mL/min (A) (by C-G formula based on SCr of 7.04 mg/dL (H)). Liver Function Tests: Recent Labs  Lab 08/07/19 0446 08/13/19 0148  AST 18  --   ALT 8  --   ALKPHOS 72  --   BILITOT 0.9  --   PROT 5.3*  --   ALBUMIN 2.9* 3.1*   No results for input(s): LIPASE, AMYLASE in the last 168 hours. No results for input(s): AMMONIA in the last 168 hours. Coagulation profile No results for input(s): INR, PROTIME in the last 168 hours.  CBC: Recent Labs  Lab 08/07/19 0446 08/10/19 0521 08/13/19 0148  WBC 23.8* 24.6* 29.6*  HGB 9.4* 9.6* 9.7*  HCT 28.8* 32.2* 30.9*  MCV 85.5 92.3 87.0  PLT 165 173 153   Cardiac Enzymes: No results for input(s): CKTOTAL, CKMB, CKMBINDEX, TROPONINI in the last 168 hours. BNP (last 3 results) No results for input(s): PROBNP in the last 8760 hours. CBG: Recent Labs  Lab 08/12/19 1622 08/12/19 2155 08/13/19 0734 08/13/19 1139 08/13/19 1651  GLUCAP 125* 118* 110* 87 142*   D-Dimer: No results for input(s): DDIMER in the last 72 hours. Hgb A1c: No results for input(s): HGBA1C in the last 72 hours. Lipid Profile: No results for input(s): CHOL, HDL, LDLCALC, TRIG, CHOLHDL, LDLDIRECT in the last 72 hours. Thyroid function studies: No results for input(s): TSH, T4TOTAL, T3FREE, THYROIDAB in the last 72 hours.  Invalid input(s): FREET3 Anemia work up: No results for input(s): VITAMINB12, FOLATE, FERRITIN, TIBC, IRON, RETICCTPCT in the last 72 hours. Sepsis Labs: Recent Labs  Lab 08/06/19 2344 08/07/19 0446 08/10/19 0521 08/13/19 0148  WBC  --  23.8* 24.6* 29.6*  LATICACIDVEN 1.2  --   --   --     Microbiology Recent Results (from the past 240 hour(s))  Urine Culture     Status: Abnormal   Collection Time: 08/06/19 10:41 PM   Specimen: Urine, Catheterized  Result Value Ref Range Status   Specimen Description   Final    URINE, CATHETERIZED  Performed at [facility], [address], [place], [place] [address]    Special Requests   Final    NONE Performed at [facility], [address] [place], [place] [address]    Culture MULTIPLE SPECIES PRESENT, SUGGEST RECOLLECTION (A)  Final   Report Status 08/08/2019 FINAL  Final  SARS CORONAVIRUS 2 (TAT 6-24 HRS) Nasopharyngeal Nasopharyngeal Swab     Status: None   Collection Time: 08/07/19  1:13 AM   Specimen: Nasopharyngeal Swab  Result Value Ref Range Status   SARS Coronavirus 2 NEGATIVE NEGATIVE Final    Comment: (NOTE) SARS-CoV-2 target  nucleic acids are NOT DETECTED. The SARS-CoV-2 RNA is generally detectable in upper and lower respiratory specimens during the acute phase of infection. Negative results do not preclude SARS-CoV-2 infection, do not rule out co-infections with other pathogens, and should not be used as the sole basis for treatment or other patient management decisions. Negative results must be combined with clinical observations, patient history, and epidemiological information. The expected result is Negative. Fact Sheet for Patients: SugarRoll.be Fact Sheet for Healthcare Providers: https://www.woods-mathews.com/ This test is not yet approved or cleared by the [place] FDA and  has been authorized for detection and/or diagnosis of SARS-CoV-2 by FDA under an Emergency Use Authorization (EUA). This EUA will remain  in effect (meaning this test can be used) for the duration of the COVID-19 declaration under Section 56 4(b)(1) of the Act, 21 U.S.C. section 360bbb-3(b)(1), unless the authorization is terminated or revoked sooner. Performed at [facility], [place] [address], [place], [place] [address]   MRSA PCR Screening     Status: Abnormal   Collection Time: 08/07/19  4:12 AM   Specimen: Nasal Mucosa; Nasopharyngeal  Result Value Ref Range Status   MRSA by PCR POSITIVE (A) NEGATIVE  Final    Comment:        The GeneXpert MRSA Assay (FDA approved for NASAL specimens only), is one component of a comprehensive MRSA colonization surveillance program. It is not intended to diagnose MRSA infection nor to guide or monitor treatment for MRSA infections. RESULT CALLED TO, READ BACK BY AND VERIFIED WITH: Corine Shelter RN [id number] 08/07/2019 HNM Performed at [facility], [address] [place], [place] [address]     Procedures and diagnostic studies:  No results found.  Medications:   . amitriptyline  25 mg Oral QHS  . apixaban  5 mg Oral BID  . atorvastatin  40 mg Oral q1800  . clopidogrel  75 mg Oral Daily  . docusate sodium  100 mg Oral Daily  . [START ON 08/14/2019] epoetin (EPOGEN/PROCRIT) injection  10,000 Units Intravenous Q T,Th,Sa-HD  . gabapentin  100 mg Oral BID  . gentamicin cream  1 application Topical Daily  . insulin aspart  0-5 Units Subcutaneous QHS  . insulin aspart  0-6 Units Subcutaneous TID WC  . insulin glargine  12 Units Subcutaneous Daily  . iron polysaccharides  150 mg Oral Daily  . isosorbide mononitrate  30 mg Oral Daily  . Melatonin  5 mg Oral QHS  . pantoprazole  40 mg Oral QHS  . sertraline  100 mg Oral QHS  . sevelamer carbonate  2,400 mg Oral TID WC  . sodium chloride flush  3 mL Intravenous Once  . sodium chloride flush  3 mL Intravenous Q12H   Continuous Infusions: . sodium chloride Stopped (08/08/19 0508)     LOS: 4 days   Berle Mull  [facility]   *Please refer to [email or web address], password [email or web address] to get updated schedule on who will round on this patient, as hospitalists switch teams weekly. If 7PM-7AM, please contact night-coverage at [email or web address], password [email or web address] for any overnight needs.  08/13/2019, 7:15 PM

## 2019-08-13 NOTE — Progress Notes (Signed)
PT Cancellation Note  Patient Details Name: Caroline Hernandez MRN: [id number] DOB: 06-Mar-1946   Cancelled Treatment:    Reason Eval/Treat Not Completed: (Patient returned from dialysis.  Treatment session attempted.  Patient politely declined participation with session due to fatigue after dialysis.  Will re-attempt next date as appropriate and available.)   Khaiden Segreto H. Owens Shark, PT, DPT, NCS 08/13/19, 3:05 PM [phone]

## 2019-08-13 NOTE — Progress Notes (Signed)
HD Tx started w/o complication, pt transitioned to HD from PD, first HD tx today   08/13/19 0811  During Hemodialysis Assessment  Blood Flow Rate (mL/min) 300 mL/min  Arterial Pressure (mmHg) -90 mmHg  Venous Pressure (mmHg) 80 mmHg  Transmembrane Pressure (mmHg) 40 mmHg  Ultrafiltration Rate (mL/min) 830 mL/min  Dialysate Flow Rate (mL/min) 600 ml/min  Conductivity: Machine  13.8  HD Safety Checks Performed Yes  Dialysis Fluid Bolus Normal Saline  Bolus Amount (mL) 250 mL  Intra-Hemodialysis Comments Tx initiated

## 2019-08-13 NOTE — Progress Notes (Signed)
HD Tx completed, tolerated well.    08/13/19 1115  Vital Signs  Pulse Rate (!) 57  Pulse Rate Source Monitor  Resp 14  BP (!) 112/53  BP Location Left Arm  BP Method Automatic  Patient Position (if appropriate) Lying  During Hemodialysis Assessment  HD Safety Checks Performed Yes  KECN 52.5 KECN  Dialysis Fluid Bolus Normal Saline  Bolus Amount (mL) 250 mL  Intra-Hemodialysis Comments Tx completed;Tolerated well

## 2019-08-13 NOTE — Progress Notes (Signed)
[facility]  ROUNDING NOTE   Subjective:   Peritoneal dialysis held last night. Placed on hemodialysis treatment this morning. Tolerating hemodialysis treatment well. UF goal of 2 liters.      Objective:  Vital signs in last 24 hours:  Temp:  [97.7 F (36.5 C)-98.2 F (36.8 C)] 97.8 F (36.6 C) (12/02 0738) Pulse Rate:  [53-56] 53 (12/02 0738) Resp:  [16-20] 18 (12/02 0738) BP: (100-110)/(37-54) 110/53 (12/02 0738) SpO2:  [99 %-100 %] 100 % (12/02 0738) Weight:  [104.6 kg] 104.6 kg (12/02 0516)  Weight change: 0.299 kg Filed Weights   08/11/19 0447 08/12/19 0451 08/13/19 0516  Weight: 105.3 kg 104.3 kg 104.6 kg    Intake/Output: I/O last 3 completed shifts: In: 240 [P.O.:240] Out: 5427 [Urine:350; CWCBJ:6283]   Intake/Output this shift:  No intake/output data recorded.  Physical Exam: General: No acute distress, laying in bed  Head: Normocephalic, atraumatic. Moist oral mucosal membranes  Eyes: Anicteric  Neck: Supple, trachea midline  Lungs:  Clear to auscultation, normal effort  Heart: bradycardia  Abdomen:  Soft, nontender, bowel sounds present  Extremities: Bilateral BK amputations, trace edema   Neurologic: Awake, alert, following commands  Skin: No lesions  Access: PD catheter  R IJ PC     Basic Metabolic Panel: Recent Labs  Lab 08/06/19 1629 08/07/19 0446 08/07/19 1013 08/08/19 0614 08/10/19 0521 08/13/19 0148  NA 134* 135  --  135 135 132*  K 2.8* 3.1*  --  4.3 4.3 4.6  CL 97* 99  --  99 99 96*  CO2 25 25  --  25 26 24   GLUCOSE 207* 146*  --  133* 188* 124*  BUN 9 12  --  20 29* 54*  CREATININE 2.96* 3.62*  --  4.74* 5.68* 7.04*  CALCIUM 8.0* 8.2*  --  8.6* 9.1 8.8*  MG  --   --  1.8 2.3 2.4  --   PHOS  --   --   --   --  3.5 3.2    Liver Function Tests: Recent Labs  Lab 08/07/19 0446 08/13/19 0148  AST 18  --   ALT 8  --   ALKPHOS 72  --   BILITOT 0.9  --   PROT 5.3*  --   ALBUMIN 2.9* 3.1*   No results for  input(s): LIPASE, AMYLASE in the last 168 hours. No results for input(s): AMMONIA in the last 168 hours.  CBC: Recent Labs  Lab 08/06/19 1629 08/07/19 0446 08/10/19 0521 08/13/19 0148  WBC 25.5* 23.8* 24.6* 29.6*  HGB 10.4* 9.4* 9.6* 9.7*  HCT 34.4* 28.8* 32.2* 30.9*  MCV 91.5 85.5 92.3 87.0  PLT 182 165 173 153    Cardiac Enzymes: No results for input(s): CKTOTAL, CKMB, CKMBINDEX, TROPONINI in the last 168 hours.  BNP: Invalid input(s): POCBNP  CBG: Recent Labs  Lab 08/12/19 0743 08/12/19 1134 08/12/19 1622 08/12/19 2155 08/13/19 0734  GLUCAP 126* 123* 125* 118* 110*    Microbiology: Results for orders placed or performed during the hospital encounter of 08/06/19  Urine Culture     Status: Abnormal   Collection Time: 08/06/19 10:41 PM   Specimen: Urine, Catheterized  Result Value Ref Range Status   Specimen Description   Final    URINE, CATHETERIZED Performed at [facility], [address] [place], [place] [address]    Special Requests   Final    NONE Performed at [facility], [address] [place], [place]  [address]    Culture MULTIPLE SPECIES PRESENT, SUGGEST RECOLLECTION (A)  Final   Report Status 08/08/2019 FINAL  Final  SARS CORONAVIRUS 2 (TAT 6-24 HRS) Nasopharyngeal Nasopharyngeal Swab     Status: None   Collection Time: 08/07/19  1:13 AM   Specimen: Nasopharyngeal Swab  Result Value Ref Range Status   SARS Coronavirus 2 NEGATIVE NEGATIVE Final    Comment: (NOTE) SARS-CoV-2 target nucleic acids are NOT DETECTED. The SARS-CoV-2 RNA is generally detectable in upper and lower respiratory specimens during the acute phase of infection. Negative results do not preclude SARS-CoV-2 infection, do not rule out co-infections with other pathogens, and should not be used as the sole basis for treatment or other patient management decisions. Negative results must be combined with clinical observations, patient history, and  epidemiological information. The expected result is Negative. Fact Sheet for Patients: SugarRoll.be Fact Sheet for Healthcare Providers: https://www.woods-mathews.com/ This test is not yet approved or cleared by the [place] FDA and  has been authorized for detection and/or diagnosis of SARS-CoV-2 by FDA under an Emergency Use Authorization (EUA). This EUA will remain  in effect (meaning this test can be used) for the duration of the COVID-19 declaration under Section 56 4(b)(1) of the Act, 21 U.S.C. section 360bbb-3(b)(1), unless the authorization is terminated or revoked sooner. Performed at [facility], [place] [address], [place], [place] [address]   MRSA PCR Screening     Status: Abnormal   Collection Time: 08/07/19  4:12 AM   Specimen: Nasal Mucosa; Nasopharyngeal  Result Value Ref Range Status   MRSA by PCR POSITIVE (A) NEGATIVE Final    Comment:        The GeneXpert MRSA Assay (FDA approved for NASAL specimens only), is one component of a comprehensive MRSA colonization surveillance program. It is not intended to diagnose MRSA infection nor to guide or monitor treatment for MRSA infections. RESULT CALLED TO, READ BACK BY AND VERIFIED WITH: Corine Shelter RN [id number] 08/07/2019 HNM Performed at [facility], [address] [place], [place] [address]     Coagulation Studies: No results for input(s): LABPROT, INR in the last 72 hours.  Urinalysis: No results for input(s): COLORURINE, LABSPEC, PHURINE, GLUCOSEU, HGBUR, BILIRUBINUR, KETONESUR, PROTEINUR, UROBILINOGEN, NITRITE, LEUKOCYTESUR in the last 72 hours.  Invalid input(s): APPERANCEUR    Imaging: No results found.   Medications:   . sodium chloride Stopped (08/08/19 0508)   . amitriptyline  25 mg Oral QHS  . apixaban  5 mg Oral BID  . atorvastatin  40 mg Oral q1800  . clopidogrel  75 mg Oral Daily  . docusate sodium  100 mg Oral Daily  .  gabapentin  100 mg Oral BID  . gentamicin cream  1 application Topical Daily  . insulin aspart  0-5 Units Subcutaneous QHS  . insulin aspart  0-6 Units Subcutaneous TID WC  . insulin glargine  12 Units Subcutaneous Daily  . iron polysaccharides  150 mg Oral Daily  . isosorbide mononitrate  30 mg Oral Daily  . Melatonin  5 mg Oral QHS  . pantoprazole  40 mg Oral QHS  . sertraline  100 mg Oral QHS  . sevelamer carbonate  2,400 mg Oral TID WC  . sodium chloride flush  3 mL Intravenous Once  . sodium chloride flush  3 mL Intravenous Q12H   sodium chloride, acetaminophen **OR** acetaminophen, heparin, sodium chloride flush  Assessment/ Plan:  73 y.o. [place] female with ESRD on PD, bilateral BKA,  coronary artery disease status post CABG, chronic systolic heart failure, CLL, diabetes mellitus type 2, hypertension, GERD, hyperlipidemia, ischemic cardiomyopathy, peripheral arterial disease.  [facility] for PD Receiving back up hemo at [place]   1.  ESRD: transition to hemodialysis as patient will need rehab on discharge.  Seen and examined on hemodialysis treatment. Next treatment for tomorrow and resume TTS schedule.   2.  Anemia of chronic kidney disease. - EPO with TTS HD treatments.   3.  Secondary hyperparathyroidism.  Phosphorus and calcium at goal.  - sevelamer  4. Hypertension: blood pressure at goal.  - isosorbide mononitrate   LOS: 4 Delron Comer 12/2/20209:09 AM

## 2019-08-13 NOTE — Progress Notes (Signed)
Dry gauze dressing was removed and replaced with occlusive dressing, including biopatch, second RN observer. Tolerated well by pt. Pt states she is scheduled for hemodialysis today, and that she was told this by the nephrologist. Pt did not received peritoneal dialysis last night, as she did the previous night.

## 2019-08-13 NOTE — TOC Progression Note (Signed)
Transition of Care [facility]) - Progression Note    Patient Details  Name: Mychal Durio MRN: [id number] Date of Birth: April 17, 1946  Transition of Care [facility]) CM/SW Contact  Cecil Cobbs Phone Number: 08/13/2019, 6:48 PM  Clinical Narrative:    CSW updated [place] and [facility] that patient is trying to decide between SNFs.   Expected Discharge Plan: Skilled Nursing Facility Barriers to Discharge: Ship broker, Continued Medical Work up  Expected Discharge Plan and Services Expected Discharge Plan: [place] Choice: [place] Living arrangements for the past 2 months: Single Family Home, [place]                           HH Arranged: NA           Social Determinants of Health (SDOH) Interventions    Readmission Risk Interventions Readmission Risk Prevention Plan 08/08/2019 06/27/2019 06/27/2019  Transportation Screening Complete - Complete  Medication Review Press photographer) Referral to Pharmacy - Complete  PCP or Specialist appointment within 3-5 days of discharge Complete - Complete  HRI or Home Care Consult Complete - Complete  SW Recovery Care/Counseling Consult Complete - Complete  Palliative Care Screening Not Applicable - Complete  Skilled Nursing Facility Complete Not Applicable Not Complete  SNF Comments - - pending PT eval  Some recent data might be hidden

## 2019-08-13 NOTE — Progress Notes (Signed)
Post HD Tx    08/13/19 1118  Hand-Off documentation  Report given to (Full Name) Amy, RN   Report received from (Full Name) Beatris Ship, RN   Vital Signs  Temp 97.8 F (36.6 C)  Temp Source Oral  Pulse Rate (!) 46  Pulse Rate Source Monitor  Resp (!) 26  BP (!) 113/52  BP Location Left Arm  BP Method Automatic  Patient Position (if appropriate) Sitting  Oxygen Therapy  SpO2 100 %  O2 Device Room Air  Pulse Oximetry Type Continuous  Pain Assessment  Pain Scale 0-10  Pain Score 0  Dialysis Weight  Weight 102.5 kg  Type of Weight Post-Dialysis  Post-Hemodialysis Assessment  Rinseback Volume (mL) 250 mL  KECN 52.5 V  Dialyzer Clearance Lightly streaked  Duration of HD Treatment -hour(s) 3 hour(s)  Hemodialysis Intake (mL) 500 mL  UF Total -Machine (mL) 2504 mL  Net UF (mL) 2004 mL  Tolerated HD Treatment Yes  Hemodialysis Catheter Right Internal jugular Double lumen Permanent (Tunneled)  Placement Date/Time: 06/30/19 1334   Time Out: Correct patient;Correct site;Correct procedure  Maximum sterile barrier precautions: Hand hygiene;Cap;Mask;Sterile gown;Large sterile sheet;Sterile gloves  Site Prep: Chlorhexidine (preferred);Skin Prep C...  Site Condition No complications  Blue Lumen Status Heparin locked  Red Lumen Status Heparin locked  Post treatment catheter status Capped and Clamped

## 2019-08-13 NOTE — Progress Notes (Signed)
Patient off the unit to dialysis unit at this time .

## 2019-08-14 DIAGNOSIS — R531 Weakness: Secondary | ICD-10-CM | POA: Diagnosis not present

## 2019-08-14 LAB — GLUCOSE, CAPILLARY
Glucose-Capillary: 118 mg/dL — ABNORMAL HIGH (ref 70–99)
Glucose-Capillary: 89 mg/dL (ref 70–99)
Glucose-Capillary: 170 mg/dL — ABNORMAL HIGH (ref 70–99)

## 2019-08-14 LAB — RENAL FUNCTION PANEL
Albumin: 3.3 g/dL — ABNORMAL LOW (ref 3.5–5.0)
Anion gap: 11 (ref 5–15)
BUN: 39 mg/dL — ABNORMAL HIGH (ref 8–23)
CO2: 25 mmol/L (ref 22–32)
Calcium: 8.4 mg/dL — ABNORMAL LOW (ref 8.9–10.3)
Chloride: 96 mmol/L — ABNORMAL LOW (ref 98–111)
Creatinine, Ser: 5.68 mg/dL — ABNORMAL HIGH (ref 0.44–1.00)
GFR calc Af Amer: 8 mL/min — ABNORMAL LOW (ref >60)
GFR calc non Af Amer: 7 mL/min — ABNORMAL LOW (ref >60)
Glucose, Bld: 135 mg/dL — ABNORMAL HIGH (ref 70–99)
Phosphorus: 3.1 mg/dL (ref 2.5–4.6)
Potassium: 4.6 mmol/L (ref 3.5–5.1)
Sodium: 132 mmol/L — ABNORMAL LOW (ref 135–145)

## 2019-08-14 LAB — CBC
HCT: 32.9 % — ABNORMAL LOW (ref 36.0–46.0)
Hemoglobin: 10.1 g/dL — ABNORMAL LOW (ref 12.0–15.0)
MCH: 27.2 pg (ref 26.0–34.0)
MCHC: 30.7 g/dL (ref 30.0–36.0)
MCV: 88.4 fL (ref 80.0–100.0)
Platelets: 162 10*3/uL (ref 150–400)
RBC: 3.72 MIL/uL — ABNORMAL LOW (ref 3.87–5.11)
RDW: 17 % — ABNORMAL HIGH (ref 11.5–15.5)
WBC: 28.3 10*3/uL — ABNORMAL HIGH (ref 4.0–10.5)
nRBC: 0 % (ref 0.0–0.2)

## 2019-08-14 LAB — SARS CORONAVIRUS 2 (TAT 6-24 HRS): SARS Coronavirus 2: NEGATIVE

## 2019-08-14 LAB — HEPATITIS B SURFACE ANTIBODY, QUANTITATIVE: Hep B S AB Quant (Post): <3.1 m[IU]/mL — ABNORMAL LOW (ref >9.9)

## 2019-08-14 MED ORDER — DOCUSATE SODIUM 100 MG PO CAPS: 100.0000 mg | ORAL_CAPSULE | Freq: Every day | ORAL | 0 refills | Status: AC

## 2019-08-14 NOTE — TOC Transition Note (Signed)
Transition of Care [facility]) - CM/SW Discharge Note   Patient Details  Name: Caroline Hernandez MRN: [id number] Date of Birth: 1946/07/06  [facility] of Care [facility]) CM/SW Contact:  Ross Ludwig, LCSW Phone Number: 08/14/2019, 3:29 PM   Clinical Narrative:    Patient to be d/c'ed today to [facility].  Patient and family agreeable to plans will transport via ems RN to call report to [phone].  Patient's daughter is aware of transfer for patient.   Final next level of care: Skilled Nursing Facility Barriers to Discharge: Barriers Resolved   Patient Goals and CMS Choice Patient states their goals for this hospitalization and ongoing recovery are:: To go to SNF for short term rehab, and then apply for Medicaid and hopefull return back home CMS Medicare.gov Compare Post Acute Care list provided to:: Patient Choice offered to / list presented to : Adult Children, Patient  Discharge Placement   Existing PASRR number confirmed : 08/11/19          Patient chooses bed at: [facility] Patient to be transferred to facility by: [facility] Name of family member notified: Wall,Jennifer Daughter [phone]  [phone] Patient and family notified of of transfer: 08/14/19  Discharge Plan and Services     Post Acute Care Choice: [place]          DME Arranged: N/A DME Agency: NA       HH Arranged: NA          Social Determinants of Health (SDOH) Interventions     Readmission Risk Interventions Readmission Risk Prevention Plan 08/08/2019 06/27/2019 06/27/2019  Transportation Screening Complete - Complete  Medication Review Press photographer) Referral to Pharmacy - Complete  PCP or Specialist appointment within 3-5 days of discharge Complete - Complete  HRI or Home Care Consult Complete - Complete  SW Recovery Care/Counseling Consult Complete - Complete  Palliative Care Screening Not Applicable - Complete  Skilled Nursing Facility Complete Not  Applicable Not Complete  SNF Comments - - pending PT eval  Some recent data might be hidden

## 2019-08-14 NOTE — Progress Notes (Signed)
Trans port is here to get patient and take her to [facility]. Report given to transport.

## 2019-08-14 NOTE — Progress Notes (Signed)
Gave report to Theme park manager at [employer]

## 2019-08-14 NOTE — TOC Progression Note (Signed)
Transition of Care [facility]) - Progression Note    Patient Details  Name: Caroline Hernandez MRN: [id number] Date of Birth: 08-07-1946  Transition [facility] Care [facility]) CM/SW Contact  Ross Ludwig, [place] Phone Number: 08/14/2019, 11:45 AM  Clinical Narrative:     CSW spoke with patient's daughter on the phone, and they have decided to go with [facility] for short term rehab.  CSW updated [employer] with patient's decision, Josem Kaufmann number is [phone] next review is 08/18/2019.  Patient will be discharging this afternoon to SNF.  Patient and his daughter are aware of discharge today.   Expected Discharge Plan: Skilled Nursing Facility Barriers to Discharge: Ship broker, Continued Medical Work up  Expected Discharge Plan and Services Expected Discharge Plan: [place] Choice: [place] Living arrangements for the past 2 months: Single Family Home, [place]                           HH Arranged: NA           Social Determinants of Health (SDOH) Interventions    Readmission Risk Interventions Readmission Risk Prevention Plan 08/08/2019 06/27/2019 06/27/2019  Transportation Screening Complete - Complete  Medication Review Press photographer) Referral to Pharmacy - Complete  PCP or Specialist appointment within 3-5 days of discharge Complete - Complete  HRI or Home Care Consult Complete - Complete  SW Recovery Care/Counseling Consult Complete - Complete  Palliative Care Screening Not Applicable - Complete  Skilled Nursing Facility Complete Not Applicable Not Complete  SNF Comments - - pending PT eval  Some recent data might be hidden

## 2019-08-14 NOTE — Discharge Summary (Signed)
[facility] Discharge Summary   Patient: Caroline Hernandez ZOX:[id number]   PCP: System, Pcp Not In DOB: 11/16/1945   Date of admission: 08/06/2019   Date of discharge:  08/14/2019    Discharge Diagnoses:  Principal Problem:   Generalized weakness Active Problems:   ESRD (end stage renal disease) ([place])   Acute lower UTI   Hypotension   Admitted From: home Disposition:  SNF   Recommendations for Outpatient Follow-up:  1. PCP: please follow up in 1 week 2. Follow up LABS/TEST:  none  Follow-up Information    Gollan, Kathlene November, MD. Schedule an appointment as soon as possible for a visit in 1 week(s).   Specialty: Cardiology Contact information: [place] [address] [phone]          Diet recommendation: Renal diet  Activity: The patient is advised to gradually reintroduce usual activities,as tolerated.  Discharge Condition: good  Code Status: Full code   History of present illness: As per the H and P dictated on admission, "Caroline Hernandez  is a 73 y.o. female,  w hypertension, hyperlipidemia, Dm2, ESRD on PD, PAD s/p bilateral BKA, CAD, CHF (EF 45%), Chronic Afib, CLL presents with c/o weakness.  Pt recent admitted on 06/25/19 for diarrhea (negative C. Diff), pt denies dysuria, n/v, fever, chills, diarrhea, brbpr, black stool. "  Hospital Course:  Summary of her active problems in the hospital is as following.  ESRD: Continue hemodialysis.  Nephrology recommends TTS schedule for now.  Once her deconditioning improves patient is willing to transition back to peritoneal dialysis if possible.  Abnormal urinalysis: Asymptomatic.  She was on ciprofloxacin prior to admission and she got 2 doses of IV Rocephin in the hospital.  Chronic atrial fibrillation with slow ventricular response/bradycardia:  Heart rate has mainly been in the 50s and has occasionally dropped into the 40s.  It appears patient is asymptomatic from this.  Continue to  monitor heart rate closely.  Continue Eliquis.  Chronic systolic/diastolic CHF with EF of 47% on 2D echo in October 2020: Torsemide has been discontinued because of hypotension Volume management via HD Currently euvolemic   IDDM:  Continue Lantus.  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 Recommended outpatient follow up.  Physical deconditioning. The patient at baseline he is able to transfer from bed to chair as well as mobilize along with the chair. Currently even with the maximum assistance the patient is unable to transfer from bed to recliner. Patient is highly motivated to physical therapy and requires aggressive physical therapy. Concern is that without aggressive physical therapy patient's outcome will be poor and her care requirement will only increase. Patient is highly motivated to be discharged home once her deconditioning improves.  seen by physical therapy, who recommended SNF, which was arranged by Education officer, museum. On the day of the discharge the patient's vitals were stable, and no other acute medical condition were reported by patient. the patient was felt safe to be discharge at SNF with SNF.  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 See ID note**PER PT-CAUSED RENAL FAILURE**   Zosyn [piperacillin Sod-tazobactam So] Other (See Comments)   Renal failure   Chlorhexidine  Medication List    STOP taking these medications   amLODipine 10 MG tablet Commonly known as: NORVASC   Cipro 500 MG tablet Generic drug: ciprofloxacin   torsemide 20 MG tablet Commonly known as: DEMADEX     TAKE these medications   amitriptyline 25 MG tablet Commonly known as:  ELAVIL Take 25 mg by mouth at bedtime.   apixaban 5 MG Tabs tablet Commonly known as: Eliquis Take 1 tablet (5 mg total) by mouth 2 (two) times daily.   atorvastatin 40 MG tablet Commonly known as: LIPITOR Take 40 mg by mouth at bedtime.   clopidogrel 75 MG tablet Commonly known as: PLAVIX Take 1 tablet (75 mg) by mouth once daily   docusate sodium 100 MG capsule Commonly known as: COLACE Take 1 capsule (100 mg total) by mouth daily. Start taking on: August 15, 2019   gabapentin 100 MG capsule Commonly known as: NEURONTIN Take 1 capsule (100 mg total) by mouth 2 (two) times daily.   HM Vitamin D3 100 MCG (4000 UT) Caps Generic drug: Cholecalciferol Take 4,000 Units by mouth daily.   insulin aspart 100 UNIT/ML injection Commonly known as: novoLOG Inject 4 Units into the skin 3 (three) times daily with meals. 10 units into the skin before breakfast then 5 units before lunch then 10 units before supper (evening meal)   insulin glargine 100 UNIT/ML injection Commonly known as: LANTUS Inject 0.12 mLs (12 Units total) into the skin daily.   iron polysaccharides 150 MG capsule Commonly known as: NIFEREX Take 150 mg by mouth daily.   isosorbide mononitrate 30 MG 24 hr tablet Commonly known as: IMDUR Take 1 tablet (30 mg total) by mouth daily.   nitroGLYCERIN 0.4 MG SL tablet Commonly known as: NITROSTAT Place 1 tablet (0.4 mg total) under the tongue every 5 (five) minutes x 3 doses as needed for chest pain.   nystatin powder Commonly known as: MYCOSTATIN/NYSTOP Apply topically 3 (three) times daily.   pantoprazole 40 MG tablet Commonly known as: PROTONIX Take 40 mg by mouth at bedtime.   sertraline 100 MG tablet Commonly known as: ZOLOFT Take 100 mg by mouth at bedtime.   sevelamer carbonate 800 MG tablet Commonly known as: RENVELA Take 3 tablets (2,400 mg total) by mouth 3 (three) times daily with meals.   vitamin C 500 MG tablet Commonly known as: ASCORBIC  ACID Take 500 mg by mouth daily.   Viteyes AREDS Formula/Lutein Caps Take 1 capsule by mouth 2 (two) times daily.      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 See ID note**PER PT-CAUSED RENAL FAILURE**   Zosyn [Piperacillin Sod-Tazobactam So] Other (See Comments)    Renal failure   Chlorhexidine    Discharge Instructions    Diet - low sodium heart healthy   Complete by: As directed    Increase activity slowly   Complete by: As directed      Discharge Exam: Filed Weights   08/13/19 0802 08/13/19 1118 08/13/19 1143  Weight: 104.9 kg 102.5 kg 102.6 kg   Vitals:   08/14/19 1215 08/14/19 1230  BP: (!) 98/54 (!) 109/51  Pulse: (!) 58 (!) 52  Resp: 10 10  Temp:    SpO2:     General: Appear in mild distress, no Rash; Oral Mucosa Clear, moist. no Abnormal Mass Or lumps Cardiovascular: S1 and S2  Present, no Murmur, Respiratory: normal respiratory effort, Bilateral Air entry present and Clear to Auscultation, no Crackles, no wheezes Abdomen: Bowel Sound present, Soft and no tenderness, no hernia Extremities: no Pedal edema, no calf tenderness Neurology: alert and oriented to time, place, and person affect appropriate  The results of significant diagnostics from this hospitalization (including imaging, microbiology, ancillary and laboratory) are listed below for reference.    Significant Diagnostic Studies: Dg Chest 2 View  Result Date: 08/06/2019 CLINICAL DATA:  Chest pain. Weakness for 1 months, worse in today. Shortness of breath. Patient from dialysis. EXAM: CHEST - 2 VIEW COMPARISON:  Chest radiograph 06/25/2019 FINDINGS: Right-sided dialysis catheter tip in the lower SVC. Left-sided pacemaker in place. Post median sternotomy. Unchanged cardiomegaly. Small left pleural effusion and adjacent atelectasis. Minimal fluid in the fissures. Possible  small right pleural effusion. No pulmonary edema. No pneumothorax. Degenerative change in the spine. No acute osseous abnormalities. IMPRESSION: 1. Small left pleural effusion with adjacent atelectasis. 2. Possible small right pleural effusion. 3. Stable cardiomegaly.  No pulmonary edema. Electronically Signed   By: Keith Rake M.D.   On: 08/06/2019 17:37   Ct Chest Wo Contrast  Result Date: 08/06/2019 CLINICAL DATA:  Shortness of breath. Rales. Patient reports weakness for 4 months, worse today. EXAM: CT CHEST WITHOUT CONTRAST TECHNIQUE: Multidetector CT imaging of the chest was performed following the standard protocol without IV contrast. COMPARISON:  Radiograph earlier this day. Chest CT 03/07/2018 FINDINGS: Cardiovascular: Right-sided dialysis catheter tip at the atrial caval junction. Left-sided pacemaker tips in the right atrium and ventricle. Multi chamber cardiomegaly. Coronary artery calcifications. Aortic atherosclerosis without aneurysm. No pericardial effusion. Post CABG. Mediastinum/Nodes: Small mediastinal lymph nodes, enlarged by size criteria. Limited assessment for hilar adenopathy given lack of IV contrast. Prominent bilateral axillary nodes are detained normal lymph node morphology. Slightly patulous esophagus without wall thickening. No suspicious thyroid nodule. Lungs/Pleura: Small bilateral pleural effusions, left greater than right. Mild heterogeneous airway 10 UA shin throughout both lungs. Linear atelectasis in the right middle lobe and anterior right upper lobe. Compressive atelectasis in the lung bases adjacent to pleural fluid. No pulmonary edema. Trachea and mainstem bronchi are patent. No pulmonary mass. Upper Abdomen: Stones in the gallbladder, no gallbladder inflammation or visualized. Advanced atherosclerosis of upper abdominal vasculature. No acute findings. Musculoskeletal: Prior median sternotomy. There are no acute or suspicious osseous abnormalities. IMPRESSION: 1.  Small bilateral pleural effusions, left greater than right. Compressive atelectasis in the lung bases adjacent to pleural fluid. 2. Slight heterogeneous parenchymal attenuation can be seen with small airways disease. 3. Multi chamber cardiomegaly. Coronary artery calcifications. No pulmonary edema. 4. Incidental cholelithiasis. Aortic Atherosclerosis (ICD10-I70.0). Electronically Signed   By: Keith Rake M.D.   On: 08/06/2019 21:26    Microbiology: Recent Results (from the past 240 hour(s))  Urine Culture     Status: Abnormal   Collection Time: 08/06/19 10:41 PM   Specimen: Urine, Catheterized  Result Value Ref Range Status   Specimen Description   Final    URINE, CATHETERIZED Performed at [facility], [address] [place], [place] [address]    Special Requests   Final    NONE Performed at [facility], [address] [place], [place] [address]    Culture MULTIPLE SPECIES PRESENT, SUGGEST RECOLLECTION (A)  Final   Report Status 08/08/2019 FINAL  Final  SARS CORONAVIRUS 2 (TAT 6-24 HRS) Nasopharyngeal Nasopharyngeal Swab     Status: None   Collection Time: 08/07/19  1:13  AM   Specimen: Nasopharyngeal Swab  Result Value Ref Range Status   SARS Coronavirus 2 NEGATIVE NEGATIVE Final    Comment: (NOTE) SARS-CoV-2 target nucleic acids are NOT DETECTED. The SARS-CoV-2 RNA is generally detectable in upper and lower respiratory specimens during the acute phase of infection. Negative results do not preclude SARS-CoV-2 infection, do not rule out co-infections with other pathogens, and should not be used as the sole basis for treatment or other patient management decisions. Negative results must be combined with clinical observations, patient history, and epidemiological information. The expected result is Negative. Fact Sheet for Patients: SugarRoll.be Fact Sheet for Healthcare  Providers: https://www.woods-mathews.com/ This test is not yet approved or cleared by the [place] FDA and  has been authorized for detection and/or diagnosis of SARS-CoV-2 by FDA under an Emergency Use Authorization (EUA). This EUA will remain  in effect (meaning this test can be used) for the duration of the COVID-19 declaration under Section 56 4(b)(1) of the Act, 21 U.S.C. section 360bbb-3(b)(1), unless the authorization is terminated or revoked sooner. Performed at [facility], [place] [address] [place], [place] [address]   MRSA PCR Screening     Status: Abnormal   Collection Time: 08/07/19  4:12 AM   Specimen: Nasal Mucosa; Nasopharyngeal  Result Value Ref Range Status   MRSA by PCR POSITIVE (A) NEGATIVE Final    Comment:        The GeneXpert MRSA Assay (FDA approved for NASAL specimens only), is one component of a comprehensive MRSA colonization surveillance program. It is not intended to diagnose MRSA infection nor to guide or monitor treatment for MRSA infections. RESULT CALLED TO, READ BACK BY AND VERIFIED WITH: Corine Shelter RN [id number] 08/07/2019 HNM Performed at [facility], [place], [place] [address]   SARS CORONAVIRUS 2 (TAT 6-24 HRS) Nasopharyngeal Nasopharyngeal Swab     Status: None   Collection Time: 08/13/19 11:28 AM   Specimen: Nasopharyngeal Swab  Result Value Ref Range Status   SARS Coronavirus 2 NEGATIVE NEGATIVE Final    Comment: (NOTE) SARS-CoV-2 target nucleic acids are NOT DETECTED. The SARS-CoV-2 RNA is generally detectable in upper and lower respiratory specimens during the acute phase of infection. Negative results do not preclude SARS-CoV-2 infection, do not rule out co-infections with other pathogens, and should not be used as the sole basis for treatment or other patient management decisions. Negative results must be combined with clinical observations, patient history, and epidemiological  information. The expected result is Negative. Fact Sheet for Patients: SugarRoll.be Fact Sheet for Healthcare Providers: https://www.woods-mathews.com/ This test is not yet approved or cleared by the [place] FDA and  has been authorized for detection and/or diagnosis of SARS-CoV-2 by FDA under an Emergency Use Authorization (EUA). This EUA will remain  in effect (meaning this test can be used) for the duration of the COVID-19 declaration under Section 56 4(b)(1) of the Act, 21 U.S.C. section 360bbb-3(b)(1), unless the authorization is terminated or revoked sooner. Performed at [facility], [place] [address], [place], [place] [address]      Labs: CBC: Recent Labs  Lab 08/10/19 0521 08/13/19 0148 08/14/19 0722  WBC 24.6* 29.6* 28.3*  HGB 9.6* 9.7* 10.1*  HCT 32.2* 30.9* 32.9*  MCV 92.3 87.0 88.4  PLT 173 153 102   Basic Metabolic Panel: Recent Labs  Lab 08/08/19 0614 08/10/19 0521 08/13/19 0148 08/14/19 0722  NA 135 135 132* 132*  K 4.3 4.3 4.6 4.6  CL 99 99 96* 96*  CO2 25 26 24 25   GLUCOSE 133* 188* 124* 135*  BUN 20 29* 54* 39*  CREATININE 4.74* 5.68* 7.04* 5.68*  CALCIUM 8.6* 9.1 8.8* 8.4*  MG 2.3 2.4  --   --   PHOS  --  3.5 3.2 3.1   Liver Function Tests: Recent Labs  Lab 08/13/19 0148 08/14/19 0722  ALBUMIN 3.1* 3.3*   No results for input(s): LIPASE, AMYLASE in the last 168 hours. No results for input(s): AMMONIA in the last 168 hours. Cardiac Enzymes: No results for input(s): CKTOTAL, CKMB, CKMBINDEX, TROPONINI in the last 168 hours. BNP (last 3 results) Recent Labs    10/16/18 0405  BNP 1,501.0*   CBG: Recent Labs  Lab 08/13/19 0734 08/13/19 1139 08/13/19 1651 08/13/19 2034 08/14/19 0816  GLUCAP 110* 87 142* 191* 118*    Time spent: 35 minutes  Signed:  Berle Mull  [facility]  08/14/2019 12:33 PM

## 2019-08-14 NOTE — Progress Notes (Signed)
This note also relates to the following rows which could not be included: Pulse Rate - Cannot attach notes to unvalidated device data BP - Cannot attach notes to unvalidated device data  Hd completed

## 2019-08-14 NOTE — Progress Notes (Signed)
[facility]  ROUNDING NOTE   Subjective:   Peritoneal dialysis held  Seen and examined on hemodialysis treatment. Tolerating hemodialysis treatment well.  Seated in chair    HEMODIALYSIS FLOWSHEET:  Blood Flow Rate (mL/min): 400 mL/min Arterial Pressure (mmHg): -120 mmHg Venous Pressure (mmHg): 110 mmHg Transmembrane Pressure (mmHg): 80 mmHg Ultrafiltration Rate (mL/min): 540 mL/min Dialysate Flow Rate (mL/min): 600 ml/min Conductivity: Machine : 13.8 Conductivity: Machine : 13.8 Dialysis Fluid Bolus: Normal Saline Bolus Amount (mL): 250 mL     Objective:  Vital signs in last 24 hours:  Temp:  [97.5 F (36.4 C)-98 F (36.7 C)] 97.5 F (36.4 C) (12/03 0814) Pulse Rate:  [37-80] 52 (12/03 1230) Resp:  [10-18] 10 (12/03 1230) BP: (95-144)/(42-76) 109/51 (12/03 1230) SpO2:  [97 %-100 %] 100 % (12/03 0945)  Weight change: 0.301 kg Filed Weights   08/13/19 0802 08/13/19 1118 08/13/19 1143  Weight: 104.9 kg 102.5 kg 102.6 kg    Intake/Output: I/O last 3 completed shifts: In: 0  Out: 2004 [Other:2004]   Intake/Output this shift:  No intake/output data recorded.  Physical Exam: General: No acute distress, sitting in chair  Head: Normocephalic, atraumatic. Moist oral mucosal membranes  Eyes: Anicteric  Neck: Supple, trachea midline  Lungs:  Clear to auscultation, normal effort  Heart: bradycardia  Abdomen:  Soft, nontender, bowel sounds present  Extremities: Bilateral BK amputations, trace edema   Neurologic: Awake, alert, following commands  Skin: No lesions  Access: PD catheter  R IJ PC     Basic Metabolic Panel: Recent Labs  Lab 08/08/19 0614 08/10/19 0521 08/13/19 0148 08/14/19 0722  NA 135 135 132* 132*  K 4.3 4.3 4.6 4.6  CL 99 99 96* 96*  CO2 25 26 24 25   GLUCOSE 133* 188* 124* 135*  BUN 20 29* 54* 39*  CREATININE 4.74* 5.68* 7.04* 5.68*  CALCIUM 8.6* 9.1 8.8* 8.4*  MG 2.3 2.4  --   --   PHOS  --  3.5 3.2 3.1    Liver  Function Tests: Recent Labs  Lab 08/13/19 0148 08/14/19 0722  ALBUMIN 3.1* 3.3*   No results for input(s): LIPASE, AMYLASE in the last 168 hours. No results for input(s): AMMONIA in the last 168 hours.  CBC: Recent Labs  Lab 08/10/19 0521 08/13/19 0148 08/14/19 0722  WBC 24.6* 29.6* 28.3*  HGB 9.6* 9.7* 10.1*  HCT 32.2* 30.9* 32.9*  MCV 92.3 87.0 88.4  PLT 173 153 162    Cardiac Enzymes: No results for input(s): CKTOTAL, CKMB, CKMBINDEX, TROPONINI in the last 168 hours.  BNP: Invalid input(s): POCBNP  CBG: Recent Labs  Lab 08/13/19 0734 08/13/19 1139 08/13/19 1651 08/13/19 2034 08/14/19 0816  GLUCAP 110* 87 142* 191* 118*    Microbiology: Results for orders placed or performed during the hospital encounter of 08/06/19  Urine Culture     Status: Abnormal   Collection Time: 08/06/19 10:41 PM   Specimen: Urine, Catheterized  Result Value Ref Range Status   Specimen Description   Final    URINE, CATHETERIZED Performed at [facility], [address] [place], [place] [address]    Special Requests   Final    NONE Performed at [facility], [address] [place],  [address]    Culture MULTIPLE SPECIES PRESENT, SUGGEST RECOLLECTION (A)  Final   Report Status 08/08/2019 FINAL  Final  SARS CORONAVIRUS 2 (TAT 6-24 HRS) Nasopharyngeal Nasopharyngeal Swab     Status: None   Collection Time:  08/07/19  1:13 AM   Specimen: Nasopharyngeal Swab  Result Value Ref Range Status   SARS Coronavirus 2 NEGATIVE NEGATIVE Final    Comment: (NOTE) SARS-CoV-2 target nucleic acids are NOT DETECTED. The SARS-CoV-2 RNA is generally detectable in upper and lower respiratory specimens during the acute phase of infection. Negative results do not preclude SARS-CoV-2 infection, do not rule out co-infections with other pathogens, and should not be used as the sole basis for treatment or other patient management decisions. Negative results must be combined  with clinical observations, patient history, and epidemiological information. The expected result is Negative. Fact Sheet for Patients: SugarRoll.be Fact Sheet for Healthcare Providers: https://www.woods-mathews.com/ This test is not yet approved or cleared by the [place] FDA and  has been authorized for detection and/or diagnosis of SARS-CoV-2 by FDA under an Emergency Use Authorization (EUA). This EUA will remain  in effect (meaning this test can be used) for the duration of the COVID-19 declaration under Section 56 4(b)(1) of the Act, 21 U.S.C. section 360bbb-3(b)(1), unless the authorization is terminated or revoked sooner. Performed at [facility], [place] [address], [place], [place] [address]   MRSA PCR Screening     Status: Abnormal   Collection Time: 08/07/19  4:12 AM   Specimen: Nasal Mucosa; Nasopharyngeal  Result Value Ref Range Status   MRSA by PCR POSITIVE (A) NEGATIVE Final    Comment:        The GeneXpert MRSA Assay (FDA approved for NASAL specimens only), is one component of a comprehensive MRSA colonization surveillance program. It is not intended to diagnose MRSA infection nor to guide or monitor treatment for MRSA infections. RESULT CALLED TO, READ BACK BY AND VERIFIED WITH: Corine Shelter RN [id number] 08/07/2019 HNM Performed at [facility], [place], [place] [address]   SARS CORONAVIRUS 2 (TAT 6-24 HRS) Nasopharyngeal Nasopharyngeal Swab     Status: None   Collection Time: 08/13/19 11:28 AM   Specimen: Nasopharyngeal Swab  Result Value Ref Range Status   SARS Coronavirus 2 NEGATIVE NEGATIVE Final    Comment: (NOTE) SARS-CoV-2 target nucleic acids are NOT DETECTED. The SARS-CoV-2 RNA is generally detectable in upper and lower respiratory specimens during the acute phase of infection. Negative results do not preclude SARS-CoV-2 infection, do not rule out co-infections with other  pathogens, and should not be used as the sole basis for treatment or other patient management decisions. Negative results must be combined with clinical observations, patient history, and epidemiological information. The expected result is Negative. Fact Sheet for Patients: SugarRoll.be Fact Sheet for Healthcare Providers: https://www.woods-mathews.com/ This test is not yet approved or cleared by the [place] FDA and  has been authorized for detection and/or diagnosis of SARS-CoV-2 by FDA under an Emergency Use Authorization (EUA). This EUA will remain  in effect (meaning this test can be used) for the duration of the COVID-19 declaration under Section 56 4(b)(1) of the Act, 21 U.S.C. section 360bbb-3(b)(1), unless the authorization is terminated or revoked sooner. Performed at [facility], [place] [address], [place],  [address]     Coagulation Studies: No results for input(s): LABPROT, INR in the last 72 hours.  Urinalysis: No results for input(s): COLORURINE, LABSPEC, PHURINE, GLUCOSEU, HGBUR, BILIRUBINUR, KETONESUR, PROTEINUR, UROBILINOGEN, NITRITE, LEUKOCYTESUR in the last 72 hours.  Invalid input(s): APPERANCEUR    Imaging: No results found.   Medications:   . sodium chloride Stopped (08/08/19 0508)   . amitriptyline  25 mg Oral  QHS  . apixaban  5 mg Oral BID  . atorvastatin  40 mg Oral q1800  . clopidogrel  75 mg Oral Daily  . docusate sodium  100 mg Oral Daily  . epoetin (EPOGEN/PROCRIT) injection  10,000 Units Intravenous Q T,Th,Sa-HD  . gabapentin  100 mg Oral BID  . gentamicin cream  1 application Topical Daily  . insulin aspart  0-5 Units Subcutaneous QHS  . insulin aspart  0-6 Units Subcutaneous TID WC  . insulin glargine  12 Units Subcutaneous Daily  . iron polysaccharides  150 mg Oral Daily  . isosorbide mononitrate  30 mg Oral Daily  . Melatonin  5 mg Oral QHS  . pantoprazole  40 mg Oral QHS   . sertraline  100 mg Oral QHS  . sevelamer carbonate  2,400 mg Oral TID WC  . sodium chloride flush  3 mL Intravenous Once  . sodium chloride flush  3 mL Intravenous Q12H   sodium chloride, acetaminophen **OR** acetaminophen, heparin, sodium chloride flush  Assessment/ Plan:  73 y.o. [place] female with ESRD on PD, bilateral BKA, coronary artery disease status post CABG, chronic systolic heart failure, CLL, diabetes mellitus type 2, hypertension, GERD, hyperlipidemia, ischemic cardiomyopathy, peripheral arterial disease.  [facility] for PD Receiving back up hemo at [facility]   1.  ESRD: transition to hemodialysis as patient will need rehab on discharge.  Seen and examined on hemodialysis treatment. Resume TTS schedule.   2.  Anemia of chronic kidney disease. - EPO with TTS HD treatments.   3.  Secondary hyperparathyroidism.  Phosphorus and calcium at goal.  - sevelamer  4. Hypertension: blood pressure at goal.  - isosorbide mononitrate   LOS: 4 Florita Nitsch 12/3/202012:46 PM

## 2019-08-14 NOTE — Progress Notes (Signed)
Hd started  

## 2019-08-14 NOTE — Progress Notes (Signed)
Attempted to call report to [facility] facility. RN unsuccessful. Will try again soon.

## 2019-08-30 ENCOUNTER — Other Ambulatory Visit: Payer: Self-pay | Admitting: Cardiovascular Disease

## 2019-09-01 ENCOUNTER — Telehealth: Payer: Self-pay | Admitting: *Deleted

## 2019-09-01 NOTE — Telephone Encounter (Signed)
Lmovm to verify if pt is taking Amlodipine 10 mg tablet qd. Medication isn't on pt's current med list needing to verify.

## 2019-09-18 ENCOUNTER — Telehealth: Payer: Self-pay | Admitting: Cardiovascular Disease

## 2019-10-02 ENCOUNTER — Telehealth: Payer: Self-pay | Admitting: Cardiovascular Disease

## 2019-10-02 NOTE — Telephone Encounter (Signed)
[facility] dropped off Death Certificate to be signed Given to [employer]

## 2019-10-06 NOTE — Telephone Encounter (Signed)
[facility] calling to check on status of Death Certificate When signed please fax to number on sticky note so they can go ahead with process then they will come by and pick up

## 2019-10-07 NOTE — Telephone Encounter (Signed)
Death certificate completed and faxed to them for review and they will come to pick it up later today or tomorrow.

## 2019-10-07 NOTE — Telephone Encounter (Signed)
Attempted to fax the patient's completed death certificate to [facility] at [phone] x 2 attempts. Failed both attempts.   I called the funeral home to confirm the fax # as stated above and this is correct.   I advised I am having trouble getting the fax to go through, but will continue to try to send this.   The funeral home was agreeable.

## 2019-10-13 NOTE — Telephone Encounter (Signed)
Officer Nicole Kindred calling in to speak with Dr. Rockey Situ regarding patient being found deceased at her home. Secure chat and text message sent to him to please call her back.

## 2019-10-13 DEATH — deceased

## 2020-06-01 IMAGING — CT CT CHEST W/O CM
2 of 3 series · 15 of 36 positions shown, 18 images · non-contrast
Comparison: None.

CLINICAL DATA: Hx of pacemaker. NKI. Pt reports cough x 1 week with
productive green sputum. Pt also reports that per her daughter she
has been "acting funny". Pt states "i'm real forgetful anyway". Pt
placed on 2L O2 via [HOSPITAL] by this RN due to O2 saturation 90-92% on RA.
F/U chest x-ray: No evidence of active disease. No IV contrast due
to elevated Tharsini

EXAM:
CT CHEST WITHOUT CONTRAST
TECHNIQUE: Multidetector CT imaging of the chest was performed following the
standard protocol without IV contrast.

[Series 2: thorax · axial · 0.82mm/px · z∈[-162,+128]mm · 12 of 171 slices shown, 15 images]
[im 13/171  mediastinal]
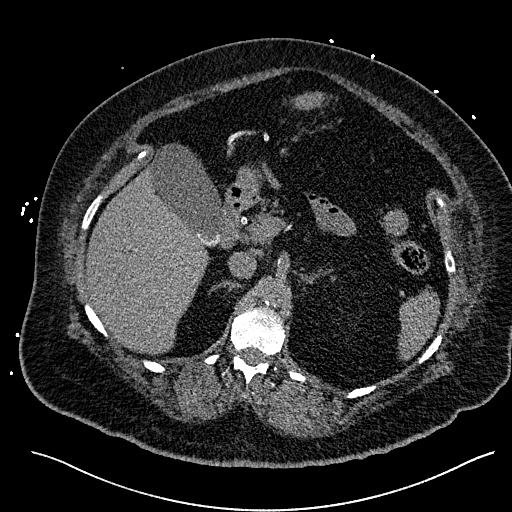
[im 13/171  lung]
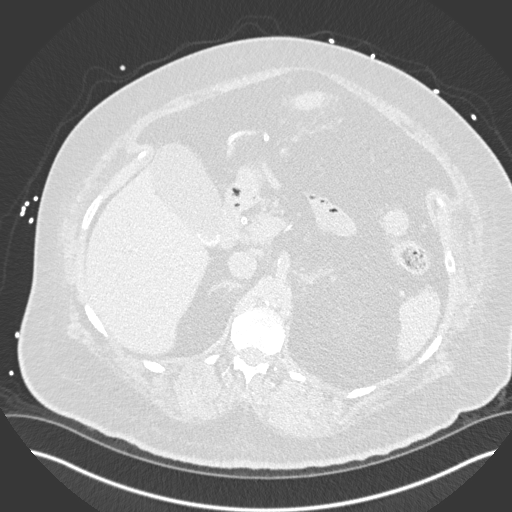
[im 26/171  lung]
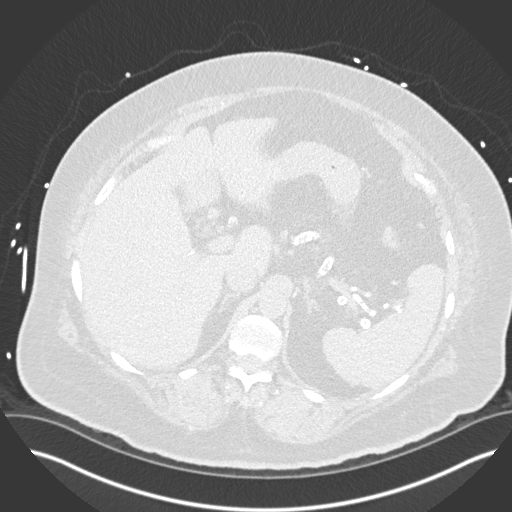
[im 38/171  lung]
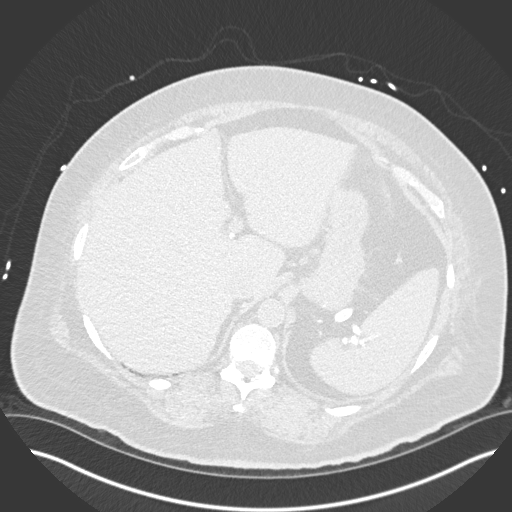
[im 51/171  lung]
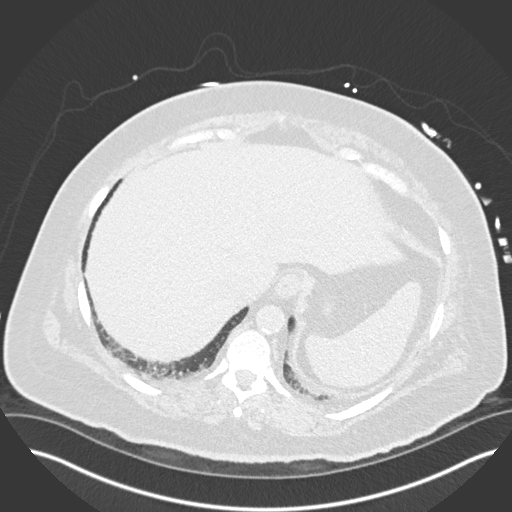
[im 63/171  mediastinal]
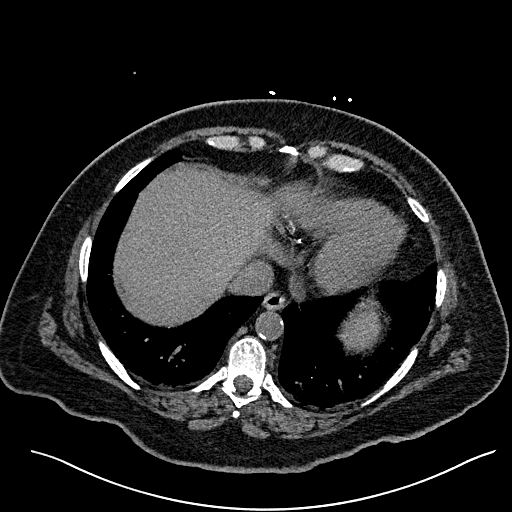
[im 63/171  lung]
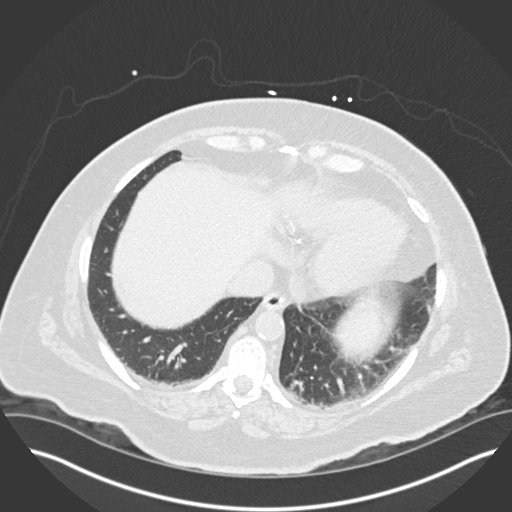
[im 76/171  lung]
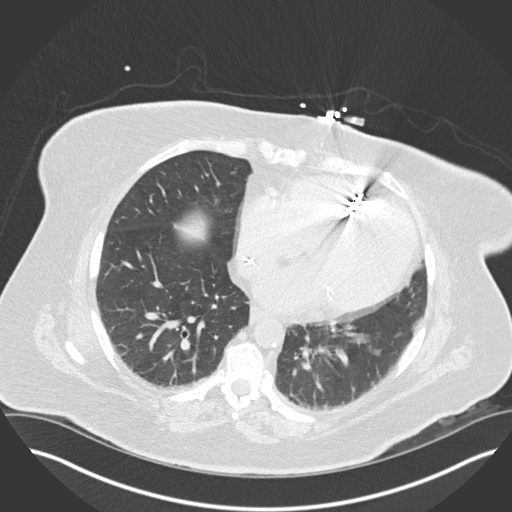
[im 95/171  lung]
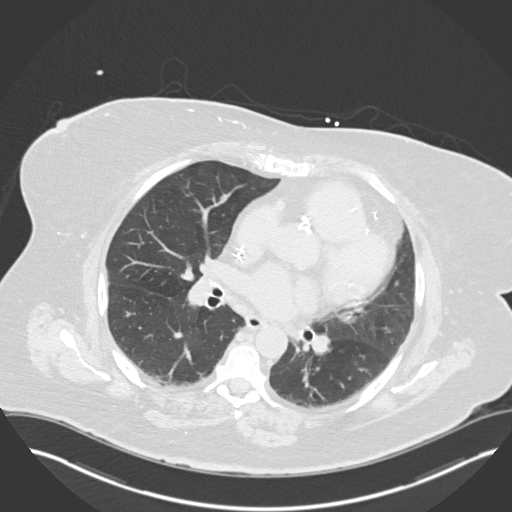
[im 108/171  lung]
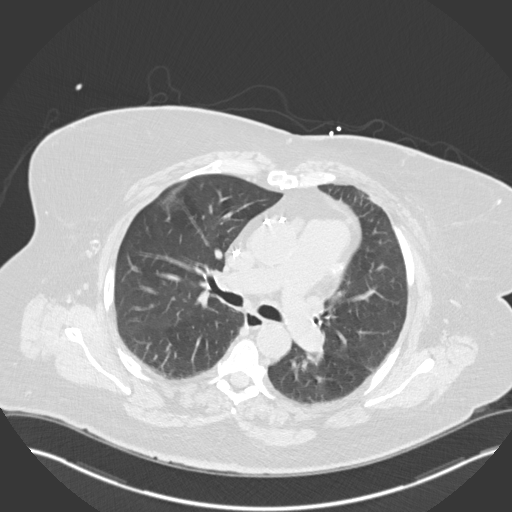
[im 120/171  mediastinal]
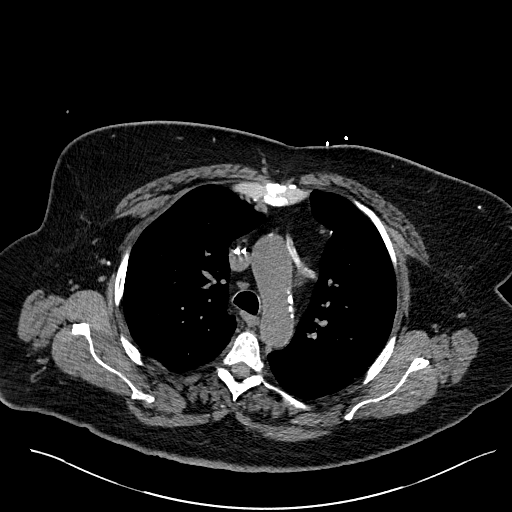
[im 120/171  lung]
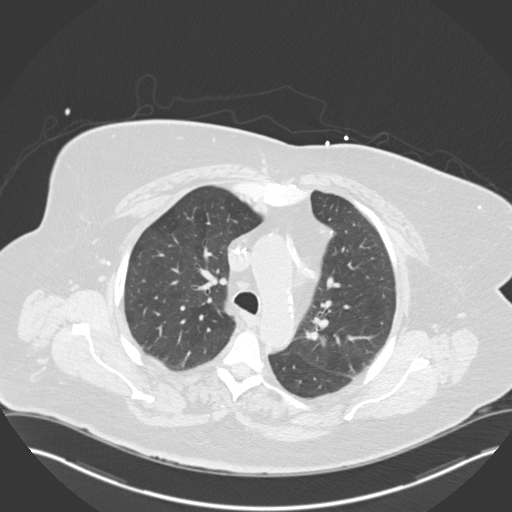
[im 133/171  lung]
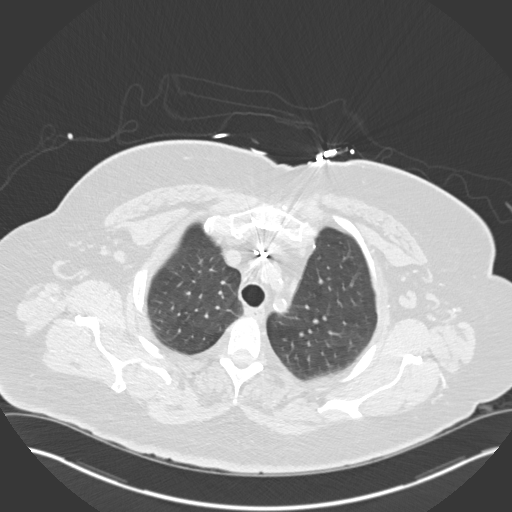
[im 145/171  lung]
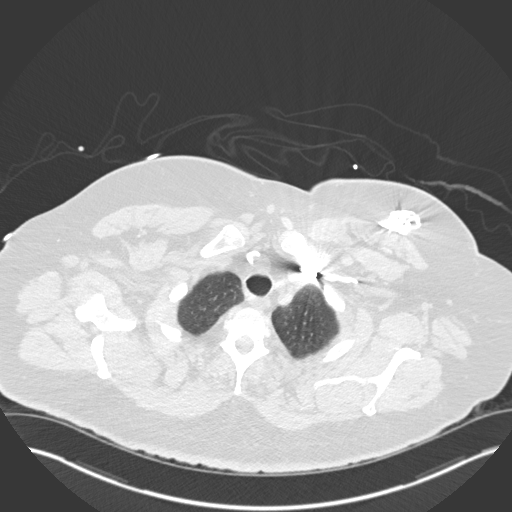
[im 158/171  lung]
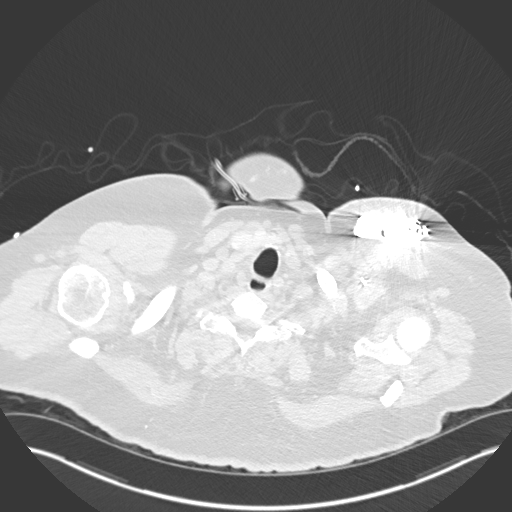

[Series 5: coronal · coronal · 0.69mm/px · 3 of 158 slices shown]
[im 32/158  lung]
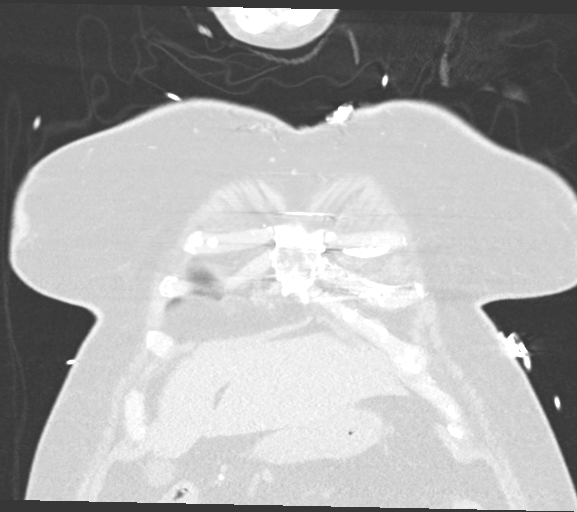
[im 63/158  lung]
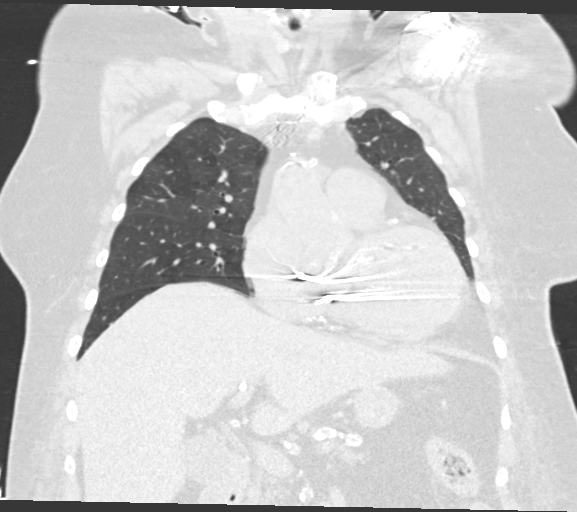
[im 95/158  lung]
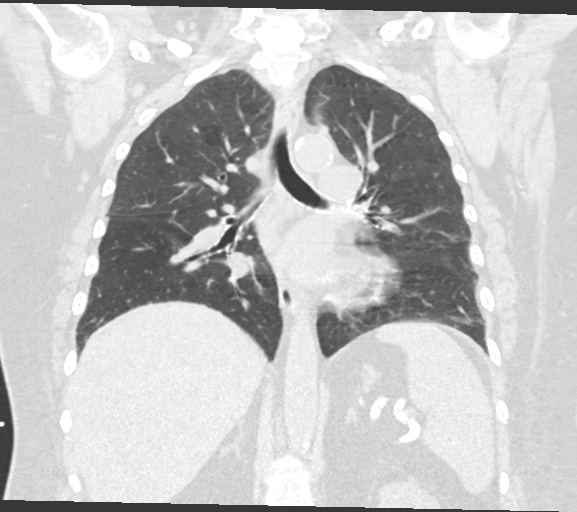

[15 of 36 positions shown; findings below may reference images not displayed]

FINDINGS: Cardiovascular: Left subclavian dual lead AICD. Mild four-chamber
cardiac enlargement. No pericardial effusion. Extensive coronary
calcifications. Previous CABG. Scattered thoracic aortic calcified
plaque without aneurysm.

Mediastinum/Nodes: No definite hilar or mediastinal adenopathy.
Heterogenous asymmetric thyroid.

Lungs/Pleura: Lungs are clear. No pleural effusion or pneumothorax.

Upper Abdomen: Multiple partially calcified subcentimeter stones in
the dependent aspect of the nondilated gallbladder. Extensive aortic
and branch vessel atheromatous calcifications. No acute findings.

Musculoskeletal: Previous median sternotomy. No fracture or
worrisome bone lesion.
IMPRESSION: 1. No acute findings.
2. Coronary and Aortic Atherosclerosis (LTAN4-170.0), post CABG.
3. Cholelithiasis.
